# Patient Record
Sex: Female | Born: 1945 | Race: Black or African American | Hispanic: No | Smoking: Former smoker
Health system: Southern US, Community
[De-identification: ages and names within clinical notes are randomized; demographics above are authoritative.]

## PROBLEM LIST (undated history)

## (undated) DIAGNOSIS — N2581 Secondary hyperparathyroidism of renal origin: Secondary | ICD-10-CM

## (undated) DIAGNOSIS — I10 Essential (primary) hypertension: Secondary | ICD-10-CM

## (undated) DIAGNOSIS — I509 Heart failure, unspecified: Secondary | ICD-10-CM

## (undated) DIAGNOSIS — N289 Disorder of kidney and ureter, unspecified: Secondary | ICD-10-CM

## (undated) DIAGNOSIS — E785 Hyperlipidemia, unspecified: Secondary | ICD-10-CM

## (undated) DIAGNOSIS — H3323 Serous retinal detachment, bilateral: Secondary | ICD-10-CM

## (undated) DIAGNOSIS — N186 End stage renal disease: Secondary | ICD-10-CM

## (undated) DIAGNOSIS — E46 Unspecified protein-calorie malnutrition: Secondary | ICD-10-CM

## (undated) DIAGNOSIS — D649 Anemia, unspecified: Secondary | ICD-10-CM

## (undated) DIAGNOSIS — Z9289 Personal history of other medical treatment: Secondary | ICD-10-CM

## (undated) DIAGNOSIS — K5900 Constipation, unspecified: Secondary | ICD-10-CM

## (undated) DIAGNOSIS — I639 Cerebral infarction, unspecified: Secondary | ICD-10-CM

## (undated) DIAGNOSIS — I2510 Atherosclerotic heart disease of native coronary artery without angina pectoris: Secondary | ICD-10-CM

## (undated) HISTORY — PX: CATARACT EXTRACTION: SUR2

## (undated) HISTORY — PX: ARTERIOVENOUS GRAFT PLACEMENT: SUR1029

## (undated) HISTORY — DX: Atherosclerotic heart disease of native coronary artery without angina pectoris: I25.10

## (undated) HISTORY — DX: Cerebral infarction, unspecified: I63.9

## (undated) HISTORY — DX: Personal history of other medical treatment: Z92.89

## (undated) HISTORY — DX: Unspecified protein-calorie malnutrition: E46

## (undated) HISTORY — PX: DG AV DIALYSIS GRAFT DECLOT OR: HXRAD813

## (undated) HISTORY — DX: Secondary hyperparathyroidism of renal origin: N25.81

## (undated) HISTORY — DX: End stage renal disease: N18.6

## (undated) HISTORY — DX: Hyperlipidemia, unspecified: E78.5

## (undated) HISTORY — DX: Anemia, unspecified: D64.9

## (undated) HISTORY — DX: Essential (primary) hypertension: I10

## (undated) HISTORY — PX: TUBAL LIGATION: SHX77

---

## 2010-02-24 DIAGNOSIS — E785 Hyperlipidemia, unspecified: Secondary | ICD-10-CM | POA: Insufficient documentation

## 2010-02-24 DIAGNOSIS — N2581 Secondary hyperparathyroidism of renal origin: Secondary | ICD-10-CM | POA: Insufficient documentation

## 2010-02-24 DIAGNOSIS — I10 Essential (primary) hypertension: Secondary | ICD-10-CM | POA: Insufficient documentation

## 2010-02-24 DIAGNOSIS — E40 Kwashiorkor: Secondary | ICD-10-CM | POA: Insufficient documentation

## 2010-02-24 DIAGNOSIS — Z992 Dependence on renal dialysis: Secondary | ICD-10-CM

## 2010-02-24 DIAGNOSIS — N186 End stage renal disease: Secondary | ICD-10-CM

## 2010-02-24 DIAGNOSIS — D649 Anemia, unspecified: Secondary | ICD-10-CM

## 2010-02-24 DIAGNOSIS — I6350 Cerebral infarction due to unspecified occlusion or stenosis of unspecified cerebral artery: Secondary | ICD-10-CM

## 2010-02-24 DIAGNOSIS — E119 Type 2 diabetes mellitus without complications: Secondary | ICD-10-CM

## 2010-02-25 ENCOUNTER — Ambulatory Visit: Payer: Self-pay | Admitting: Internal Medicine

## 2010-02-25 ENCOUNTER — Encounter: Payer: Self-pay | Admitting: Internal Medicine

## 2010-02-25 DIAGNOSIS — R079 Chest pain, unspecified: Secondary | ICD-10-CM | POA: Insufficient documentation

## 2010-02-25 DIAGNOSIS — R0609 Other forms of dyspnea: Secondary | ICD-10-CM

## 2010-02-25 DIAGNOSIS — R0989 Other specified symptoms and signs involving the circulatory and respiratory systems: Secondary | ICD-10-CM

## 2010-03-12 ENCOUNTER — Telehealth (INDEPENDENT_AMBULATORY_CARE_PROVIDER_SITE_OTHER): Payer: Self-pay | Admitting: *Deleted

## 2010-03-13 ENCOUNTER — Ambulatory Visit: Payer: Self-pay

## 2010-03-13 ENCOUNTER — Ambulatory Visit (HOSPITAL_COMMUNITY): Admission: RE | Admit: 2010-03-13 | Payer: Self-pay | Source: Home / Self Care | Admitting: Internal Medicine

## 2010-03-25 ENCOUNTER — Encounter: Payer: Self-pay | Admitting: Internal Medicine

## 2010-03-25 ENCOUNTER — Ambulatory Visit (HOSPITAL_COMMUNITY)
Admission: RE | Admit: 2010-03-25 | Discharge: 2010-03-25 | Payer: Self-pay | Source: Home / Self Care | Attending: Internal Medicine | Admitting: Internal Medicine

## 2010-03-25 ENCOUNTER — Ambulatory Visit: Admission: RE | Admit: 2010-03-25 | Discharge: 2010-03-25 | Payer: Self-pay | Source: Home / Self Care

## 2010-03-25 ENCOUNTER — Encounter: Payer: Self-pay | Admitting: Cardiovascular Disease

## 2010-03-25 ENCOUNTER — Encounter (HOSPITAL_COMMUNITY)
Admission: RE | Admit: 2010-03-25 | Discharge: 2010-04-15 | Payer: Self-pay | Source: Home / Self Care | Attending: Internal Medicine | Admitting: Internal Medicine

## 2010-03-25 DIAGNOSIS — I495 Sick sinus syndrome: Secondary | ICD-10-CM

## 2010-03-26 ENCOUNTER — Ambulatory Visit (HOSPITAL_COMMUNITY)
Admission: RE | Admit: 2010-03-26 | Discharge: 2010-03-26 | Payer: Self-pay | Source: Home / Self Care | Attending: Nephrology | Admitting: Nephrology

## 2010-03-27 ENCOUNTER — Ambulatory Visit (HOSPITAL_COMMUNITY)
Admission: RE | Admit: 2010-03-27 | Discharge: 2010-03-27 | Payer: Self-pay | Source: Home / Self Care | Attending: Nephrology | Admitting: Nephrology

## 2010-03-31 LAB — BASIC METABOLIC PANEL
BUN: 79 mg/dL — ABNORMAL HIGH (ref 6–23)
CO2: 25 mEq/L (ref 19–32)
Calcium: 10.2 mg/dL (ref 8.4–10.5)
Chloride: 101 mEq/L (ref 96–112)
Creatinine, Ser: 15.48 mg/dL — ABNORMAL HIGH (ref 0.4–1.2)
GFR calc Af Amer: 3 mL/min — ABNORMAL LOW (ref >60)
GFR calc non Af Amer: 2 mL/min — ABNORMAL LOW (ref >60)
Glucose, Bld: 85 mg/dL (ref 70–99)
Potassium: 6.7 mEq/L (ref 3.5–5.1)
Sodium: 137 mEq/L (ref 135–145)

## 2010-03-31 LAB — POCT I-STAT 4, (NA,K, GLUC, HGB,HCT)
Glucose, Bld: 84 mg/dL (ref 70–99)
Glucose, Bld: 82 mg/dL (ref 70–99)
HCT: 31 % — ABNORMAL LOW (ref 36.0–46.0)
HCT: 37 % (ref 36.0–46.0)
Hemoglobin: 10.5 g/dL — ABNORMAL LOW (ref 12.0–15.0)
Hemoglobin: 12.6 g/dL (ref 12.0–15.0)
Potassium: 4.5 mEq/L (ref 3.5–5.1)
Potassium: 4 mEq/L (ref 3.5–5.1)
Sodium: 140 mEq/L (ref 135–145)
Sodium: 140 mEq/L (ref 135–145)

## 2010-03-31 LAB — GLUCOSE, CAPILLARY: Glucose-Capillary: 84 mg/dL (ref 70–99)

## 2010-03-31 LAB — POTASSIUM: Potassium: 7 mEq/L (ref 3.5–5.1)

## 2010-04-02 LAB — POCT I-STAT 4, (NA,K, GLUC, HGB,HCT)
Glucose, Bld: 83 mg/dL (ref 70–99)
HCT: 31 % — ABNORMAL LOW (ref 36.0–46.0)
Hemoglobin: 10.5 g/dL — ABNORMAL LOW (ref 12.0–15.0)
Potassium: 6.6 mEq/L (ref 3.5–5.1)
Sodium: 138 mEq/L (ref 135–145)

## 2010-04-08 ENCOUNTER — Ambulatory Visit: Admit: 2010-04-08 | Payer: Self-pay | Admitting: Internal Medicine

## 2010-04-17 NOTE — Assessment & Plan Note (Addendum)
Summary: Cardiology Nuclear Testing  Nuclear Med Background Indications for Stress Test: Evaluation for Ischemia   History: Heart Catheterization  History Comments: 6 years ago ~ Heart Cath: NL  Symptoms: Chest Pain with Exertion, DOE, Palpitations    Nuclear Pre-Procedure Cardiac Risk Factors: CVA, Hypertension, IDDM Type 1, Lipids Caffeine/Decaff Intake: None NPO After: 5:00 PM Lungs: clear IV 0.9% NS with Angio Cath: 22g     IV Site: L Hand IV Started by: Eliezer Lofts, EMT-P Chest Size (in) 40     Cup Size C     Height (in): 64 Weight (lb): 193 BMI: 33.25  Nuclear Med Study 1 or 2 day study:  1 day     Stress Test Type:  Carlton Adam Reading MD:  Mertie Moores, MD     Referring MD:  S.Klein Resting Radionuclide:  Technetium 45m Tetrofosmin     Resting Radionuclide Dose:  11 mCi  Stress Radionuclide:  Technetium 80m Tetrofosmin     Stress Radionuclide Dose:  33 mCi   Stress Protocol  Max Systolic BP: 123XX123 mm Hg Lexiscan: 0.4 mg   Stress Test Technologist:  Perrin Maltese, EMT-P     Nuclear Technologist:  Charlton Amor, CNMT  Rest Procedure  Myocardial perfusion imaging was performed at rest 45 minutes following the intravenous administration of Technetium 32m Tetrofosmin.  Stress Procedure  The patient received IV Lexiscan 0.4 mg over 15-seconds.  Technetium 5m Tetrofosmin injected at 30-seconds. The patient had sob and a funny feeling with the [place].  There were no significant changes and rare pvcs/pacs with infusion.  Quantitative spect images were obtained after a 45 minute delay.  QPS Raw Data Images:  Normal; no motion artifact; normal heart/lung ratio. Stress Images:  Normal homogeneous uptake in all areas of the myocardium. Rest Images:  Normal homogeneous uptake in all areas of the myocardium. Subtraction (SDS):  No evidence of ischemia. Transient Ischemic Dilatation:  .99  (Normal <1.22)  Lung/Heart Ratio:  .32  (Normal <0.45)  Quantitative Gated Spect  Images QGS EDV:  132 ml QGS ESV:  59 ml QGS EF:  55 % QGS cine images:  Normal LV function.  Findings Normal nuclear study      Overall Impression  Exercise Capacity: Lexiscan with no exercise. BP Response: Normal blood pressure response. Clinical Symptoms: No chest pain ECG Impression: No significant ST segment change suggestive of ischemia. Overall Impression: Normal stress nuclear study. Overall Impression Comments: No evidence of ischemia.  Normal LV function.  Appended Document: Cardiology Nuclear Testing adv pt of results. Joan Flores, RN, BSN

## 2010-04-17 NOTE — Progress Notes (Signed)
Summary: Nuclear Pre-Procedure  Phone Note Outgoing Call Call back at [facility] Phone [phone] Call back at [phone]   Call placed by: Eliezer Lofts, EMT-P,  March 12, 2010 3:49 PM Call placed to: Patient Action Taken: Phone Call Completed Summary of Call: Reviewed information on Myoview Information Sheet (see scanned document for further details).  Spoke with Patient's son, Gerald Stabs, at [phone], and Spoke with Patient on # [phone]. Eliezer Lofts, EMT-P  March 12, 2010 3:51 PM     Nuclear Med Background Indications for Stress Test: Evaluation for Ischemia   History: Heart Catheterization  History Comments: 6 years ago ~ Heart Cath: NL  Symptoms: Chest Pain with Exertion, DOE    Nuclear Pre-Procedure Cardiac Risk Factors: CVA, Hypertension, IDDM Type 1, Lipids Height (in): 64

## 2010-04-17 NOTE — Assessment & Plan Note (Signed)
Summary: NEP/BRADYCARDIA-MB   Visit Type:  np  CC:  dizziness, SOB, and .  History of Present Illness: Mrs. Bresson is seen at the request of Dr.Coldonato  because of bradycardia identified at dialysis.  she has a history of exercise intolerance that has been relatively stable over the last 2-3 years. Ii is  PRECEDED BY CHEST TIGHTNESS THAT DOES NOT RADIATE> It is occasionally accompanied by diaphoresis. It is relieved by rest but often takes 30 minutes. It is occasionally associated with palpitations.  Her cardiac risk profile is notable for diabetes hypertension dyslipidemia; she does not smoke her family history is negative for coronary disease.  includes records from the dialysis center were reviewed.  Preventive Screening-Counseling & Management  Alcohol-Tobacco     Smoking Status: never  Caffeine-Diet-Exercise     Does Patient Exercise: no  Problems Prior to Update: 1)  Bradycardia  (ICD-427.81) 2)  Chest Pain, Exertional  (ICD-786.50) 3)  Dyspnea On Exertion  (ICD-786.09) 4)  Hypertension  (ICD-401.9) 5)  Hyperlipidemia  (ICD-272.4) 6)  Cva  (ICD-434.91) 7)  Protein Malnutrition  (ICD-260) 8)  Secondary Hyperparathyroidism  (ICD-588.81) 9)  Anemia  (ICD-285.9) 10)  Dm  (ICD-250.00) 11)  End Stage Renal Disease  (ICD-585.6)  Current Medications (verified): 1)  Vasotec 10 Mg Tabs (Enalapril Maleate) .... Once Daily 2)  Pravachol 40 Mg Tabs (Pravastatin Sodium) .... Once Daily 3)  Sensipar 60 Mg Tabs (Cinacalcet Hcl) .... Once Daily 4)  Norvasc 10 Mg Tabs (Amlodipine Besylate) .... Once Daily 5)  Eliphos 667 Mg Tabs (Calcium Acetate (Phos Binder)) .... Once Daily 6)  Humulin N 100 Unit/ml Susp (Insulin Isophane Human) .... Take As Directed  Allergies (verified): No Known Drug Allergies  Past History:  Past Medical History: Last updated: 02/24/2010 HYPERTENSION  HYPERLIPIDEMIA  CVA  PROTEIN MALNUTRITION  SECONDARY HYPERPARATHYROIDISM ANEMIA DM  END STAGE  RENAL DISEASE  Past Surgical History: Last updated: 02/24/2010 Cataract sugery  Tubal ligation  Family History: Family History of Cardiomyopathy:   Social History: Divorced 5 children Tobacco Use - No.  Alcohol Use - no Regular Exercise - no Smoking Status:  never Does Patient Exercise:  no  Review of Systems       full review of systems was negative apart from a history of present illness and past medical history except dentures   Vital Signs:  Patient profile:   65 year old female Height:      64 inches Weight:      191.25 pounds BMI:     32.95 Pulse rate:   70 / minute BP sitting:   100 / 54  (left arm) Cuff size:   regular  Vitals Entered By: Hansel Feinstein CMA (February 25, 2010 3:55 PM)  Physical Exam  General:  elderly African American female well-nourished appearing her stated age. She is moderately obese Head:  normal HEENT Neck:  supple without thyromegaly there is no lymphadenopathy Chest Wall:  without scoliosis or kyphosis Lungs:  clear to auscultation Heart:  regular rate and rhythm with an 0000000 2/6 systolic murmur on the left lower sternal border to the apex there is no clubbing cyanosis or edema. No JVD. Negative HJR Abdomen:  soft with active bowel sounds hepatomegaly or midline pulsation Msk:  without skeletal defects Pulses:  intact distal pulses Extremities:  without clubbing cyanosis or edema Neurologic:  alert and oriented and grossly normal her sensory function Skin:  warm and dry Psych:  engaging affect   EKG  Procedure date:  02/25/2010  Findings:      sinus rhythm at 68 Intervals 0.15 5.09/24 2 Axis is -5 Nonspecific T wave changes  Impression & Recommendations:  Problem # 1:  BRADYCARDIA (ICD-427.81)  the patient has had bradycardia at dialysis. We'll undertake a [facility] monitor to identify the degree and frequency of her bradycardia and dizziness we can identify any associated symptoms. Her updated medication list for  this problem includes:    Vasotec 10 Mg Tabs (Enalapril maleate) ..... Once daily    Norvasc 10 Mg Tabs (Amlodipine besylate) ..... Once daily  Orders: Holter Monitor (Holter Monitor)  Problem # 2:  CHEST PAIN, EXERTIONAL (ICD-786.50)  she is a high risk for coronary artery disease. However, she had a negative catheterization apparently at the [facility] about 6 years ago. Hence her pretest probability is modestly diminished and will undertake Myoview scanning to clarify the presence of coronary disease. I would have a low threshold for pursuing catheterization  Her updated medication list for this problem includes:    Vasotec 10 Mg Tabs (Enalapril maleate) ..... Once daily    Norvasc 10 Mg Tabs (Amlodipine besylate) ..... Once daily  Problem # 3:  DYSPNEA ON EXERTION (ICD-786.09)  dyspnea may be related to both structural abnormalities extrinsic compression from a uremic effusion. We will undertake a 2-D echo to look at this. Her updated medication list for this problem includes:    Vasotec 10 Mg Tabs (Enalapril maleate) ..... Once daily    Norvasc 10 Mg Tabs (Amlodipine besylate) ..... Once daily  Orders: Echocardiogram (Echo)  Other Orders: Nuclear Stress Test (Nuc Stress Test)  Patient Instructions: 1)  Your physician recommends that you schedule a follow-up appointment in: 5 weeks 2)  Your physician recommends that you continue on your current medications as directed. Please refer to the Current Medication list given to you today. 3)  Your physician has requested that you have an echocardiogram.  Echocardiography is a painless test that uses sound waves to create images of your heart. It provides your doctor with information about the size and shape of your heart and how well your heart's chambers and valves are working.  This procedure takes approximately one hour. There are no restrictions for this procedure. 4)  Your physician has recommended that you wear a holter  monitor.  Holter monitors are medical devices that record the heart's electrical activity. Doctors most often use these monitors to diagnose arrhythmias. Arrhythmias are problems with the speed or rhythm of the heartbeat. The monitor is a small, portable device. You can wear one while you do your normal daily activities. This is usually used to diagnose what is causing palpitations/syncope (passing out). 5)  Your physician has requested that you have a Lexiscan myoview.  For further information please visit [email or web address].  Please follow instruction sheet, as given.

## 2010-06-26 ENCOUNTER — Telehealth: Payer: Self-pay | Admitting: *Deleted

## 2010-06-26 NOTE — Telephone Encounter (Signed)
LMTCB RE MONITOR RESULTS HR 40-120 WITH OCC PVC PER DR KLEIN./CY

## 2010-06-27 NOTE — Telephone Encounter (Signed)
LMTCB ./CY 

## 2010-06-30 NOTE — Telephone Encounter (Signed)
PT AWARE,.CY

## 2010-07-09 ENCOUNTER — Encounter: Payer: Self-pay | Admitting: Internal Medicine

## 2010-07-10 ENCOUNTER — Ambulatory Visit (INDEPENDENT_AMBULATORY_CARE_PROVIDER_SITE_OTHER): Payer: Medicare Other | Admitting: Internal Medicine

## 2010-07-10 ENCOUNTER — Encounter: Payer: Self-pay | Admitting: Internal Medicine

## 2010-07-10 DIAGNOSIS — I498 Other specified cardiac arrhythmias: Secondary | ICD-10-CM

## 2010-07-10 DIAGNOSIS — R001 Bradycardia, unspecified: Secondary | ICD-10-CM

## 2010-07-10 DIAGNOSIS — R079 Chest pain, unspecified: Secondary | ICD-10-CM

## 2010-07-10 NOTE — Assessment & Plan Note (Signed)
We reviewed the normal nuclear study. This is very reassuring

## 2010-07-10 NOTE — Assessment & Plan Note (Signed)
E. The recording demonstrated intermittent sinus bradycardia. There have been no attributable symptoms. At this point I would just observe and let symptoms declarer themselves.

## 2010-07-10 NOTE — Progress Notes (Signed)
  HPI  Stefanie [name] is a 65 y.o. female Seen in followup for bradycardia that was identified at dialysis. She also had atypical chest pains and underwent Myoview scanning after we last saw her. This demonstrated normal left ventricular function and no ischemia. A 20 day event recorder was pursued demonstrating mostly normal heart rate. There is one occasion where her heart rate was identified at 39 and rarely in the 40s her mean heart rate on Sundays was in the 75s. Heart rate excursion was adequate  She denies significant problems with chest pain shortness of breath; there has been no lightheadedness.  Past Medical History  Diagnosis Date  . Hypertension   . Hyperlipidemia   . Diabetes mellitus   . CVA (cerebral vascular accident)   . Protein malnutrition   . Secondary hyperparathyroidism   . Anemia   . End stage renal disease     Past Surgical History  Procedure Date  . Cataract extraction   . Tubal ligation     Current Outpatient Prescriptions  Medication Sig Dispense Refill  . amLODipine (NORVASC) 10 MG tablet Take 10 mg by mouth daily.        . calcium acetate (ELIPHOS) 667 MG tablet Take 667 mg by mouth daily.        . cinacalcet (SENSIPAR) 60 MG tablet Take 60 mg by mouth daily.        . enalapril (VASOTEC) 10 MG tablet Take 10 mg by mouth daily.        . insulin NPH (HUMULIN N,NOVOLIN N) 100 UNIT/ML injection 30 units morning and 20 units qhs      . pravastatin (PRAVACHOL) 40 MG tablet Take 40 mg by mouth daily.          No Known Allergies  Review of Systems negative except from HPI and PMH  Physical Exam Well developed and well nourished in no acute distress HENT normal E scleral and icterus clear Neck Supple JVP flat; carotids brisk and full Clear to ausculation Regular rate and rhythm, no murmurs +s4 Soft with active bowel sounds No clubbing cyanosis and edema Alert and oriented, grossly normal motor and sensory function Skin Warm and  Dry     Assessment and  Plan

## 2010-07-10 NOTE — Patient Instructions (Signed)
Your physician recommends that you schedule a follow-up appointment in: AS NEEDED  Your physician recommends that you continue on your current medications as directed. Please refer to the Current Medication list given to you today.  

## 2010-07-18 ENCOUNTER — Other Ambulatory Visit (HOSPITAL_COMMUNITY): Payer: Self-pay | Admitting: Nephrology

## 2010-07-18 DIAGNOSIS — N186 End stage renal disease: Secondary | ICD-10-CM

## 2010-07-24 ENCOUNTER — Other Ambulatory Visit (HOSPITAL_COMMUNITY): Payer: Self-pay | Admitting: Nephrology

## 2010-07-24 ENCOUNTER — Ambulatory Visit (HOSPITAL_COMMUNITY)
Admission: RE | Admit: 2010-07-24 | Discharge: 2010-07-24 | Disposition: A | Discharge status: Home / Self Care | Payer: Medicare Other | Source: Ambulatory Visit | Attending: Nephrology | Admitting: Nephrology

## 2010-07-24 DIAGNOSIS — N186 End stage renal disease: Secondary | ICD-10-CM

## 2010-07-24 DIAGNOSIS — Y832 Surgical operation with anastomosis, bypass or graft as the cause of abnormal reaction of the patient, or of later complication, without mention of misadventure at the time of the procedure: Secondary | ICD-10-CM | POA: Insufficient documentation

## 2010-07-24 DIAGNOSIS — T82898A Other specified complication of vascular prosthetic devices, implants and grafts, initial encounter: Secondary | ICD-10-CM | POA: Insufficient documentation

## 2010-07-24 MED ORDER — IOHEXOL 300 MG/ML  SOLN
100.0000 mL | Freq: Once | INTRAMUSCULAR | Status: AC | PRN
  Administered 2010-07-24: 40 mL via INTRAVENOUS

## 2010-09-19 ENCOUNTER — Other Ambulatory Visit (HOSPITAL_COMMUNITY): Payer: Self-pay | Admitting: Nephrology

## 2010-09-19 DIAGNOSIS — N186 End stage renal disease: Secondary | ICD-10-CM

## 2010-09-19 DIAGNOSIS — N19 Unspecified kidney failure: Secondary | ICD-10-CM

## 2010-09-30 ENCOUNTER — Other Ambulatory Visit (HOSPITAL_COMMUNITY): Payer: Medicare Other

## 2010-10-02 ENCOUNTER — Other Ambulatory Visit (HOSPITAL_COMMUNITY): Payer: Medicare Other

## 2010-10-02 ENCOUNTER — Ambulatory Visit (HOSPITAL_COMMUNITY): Payer: Medicare Other

## 2010-10-09 ENCOUNTER — Ambulatory Visit (HOSPITAL_COMMUNITY)
Admission: RE | Admit: 2010-10-09 | Discharge: 2010-10-09 | Disposition: A | Discharge status: Home / Self Care | Payer: Medicare Other | Source: Ambulatory Visit | Attending: Nephrology | Admitting: Nephrology

## 2010-10-09 ENCOUNTER — Other Ambulatory Visit (HOSPITAL_COMMUNITY): Payer: Self-pay | Admitting: Nephrology

## 2010-10-09 DIAGNOSIS — I871 Compression of vein: Secondary | ICD-10-CM | POA: Insufficient documentation

## 2010-10-09 DIAGNOSIS — E119 Type 2 diabetes mellitus without complications: Secondary | ICD-10-CM | POA: Insufficient documentation

## 2010-10-09 DIAGNOSIS — T82898A Other specified complication of vascular prosthetic devices, implants and grafts, initial encounter: Secondary | ICD-10-CM | POA: Insufficient documentation

## 2010-10-09 DIAGNOSIS — N19 Unspecified kidney failure: Secondary | ICD-10-CM

## 2010-10-09 DIAGNOSIS — Y832 Surgical operation with anastomosis, bypass or graft as the cause of abnormal reaction of the patient, or of later complication, without mention of misadventure at the time of the procedure: Secondary | ICD-10-CM | POA: Insufficient documentation

## 2010-10-09 DIAGNOSIS — N186 End stage renal disease: Secondary | ICD-10-CM | POA: Insufficient documentation

## 2010-10-09 DIAGNOSIS — I120 Hypertensive chronic kidney disease with stage 5 chronic kidney disease or end stage renal disease: Secondary | ICD-10-CM | POA: Insufficient documentation

## 2010-10-09 DIAGNOSIS — Z8673 Personal history of transient ischemic attack (TIA), and cerebral infarction without residual deficits: Secondary | ICD-10-CM | POA: Insufficient documentation

## 2010-10-09 MED ORDER — IOHEXOL 300 MG/ML  SOLN
100.0000 mL | Freq: Once | INTRAMUSCULAR | Status: AC | PRN
  Administered 2010-10-09: 60 mL via INTRAVENOUS

## 2010-10-13 ENCOUNTER — Other Ambulatory Visit (HOSPITAL_COMMUNITY): Payer: Self-pay | Admitting: Nephrology

## 2010-10-13 DIAGNOSIS — T8249XA Other complication of vascular dialysis catheter, initial encounter: Secondary | ICD-10-CM

## 2010-10-14 ENCOUNTER — Ambulatory Visit (HOSPITAL_COMMUNITY)
Admission: RE | Admit: 2010-10-14 | Discharge: 2010-10-14 | Disposition: A | Discharge status: Home / Self Care | Payer: Medicare Other | Source: Ambulatory Visit | Attending: Nephrology | Admitting: Nephrology

## 2010-10-14 ENCOUNTER — Other Ambulatory Visit (HOSPITAL_COMMUNITY): Payer: Self-pay | Admitting: Nephrology

## 2010-10-14 DIAGNOSIS — E119 Type 2 diabetes mellitus without complications: Secondary | ICD-10-CM | POA: Insufficient documentation

## 2010-10-14 DIAGNOSIS — Z8673 Personal history of transient ischemic attack (TIA), and cerebral infarction without residual deficits: Secondary | ICD-10-CM | POA: Insufficient documentation

## 2010-10-14 DIAGNOSIS — D649 Anemia, unspecified: Secondary | ICD-10-CM | POA: Insufficient documentation

## 2010-10-14 DIAGNOSIS — N186 End stage renal disease: Secondary | ICD-10-CM | POA: Insufficient documentation

## 2010-10-14 DIAGNOSIS — I120 Hypertensive chronic kidney disease with stage 5 chronic kidney disease or end stage renal disease: Secondary | ICD-10-CM | POA: Insufficient documentation

## 2010-10-14 DIAGNOSIS — I509 Heart failure, unspecified: Secondary | ICD-10-CM | POA: Insufficient documentation

## 2010-10-14 DIAGNOSIS — T8249XA Other complication of vascular dialysis catheter, initial encounter: Secondary | ICD-10-CM

## 2010-10-14 LAB — POTASSIUM: Potassium: 5.5 mEq/L — ABNORMAL HIGH (ref 3.5–5.1)

## 2010-10-28 ENCOUNTER — Ambulatory Visit: Payer: Medicare Other | Admitting: Vascular Surgery

## 2010-11-04 ENCOUNTER — Encounter: Payer: Self-pay | Admitting: Vascular Surgery

## 2010-11-04 ENCOUNTER — Ambulatory Visit (INDEPENDENT_AMBULATORY_CARE_PROVIDER_SITE_OTHER): Payer: Medicare Other | Admitting: Vascular Surgery

## 2010-11-04 VITALS — BP 99/50 | HR 68 | Temp 98.1°F | Ht 66.0 in | Wt 185.0 lb

## 2010-11-04 DIAGNOSIS — N186 End stage renal disease: Secondary | ICD-10-CM

## 2010-11-04 NOTE — Progress Notes (Deleted)
Subjective:     Patient ID: Stefanie [name], female   DOB: 05-07-1945, 65 y.o.   MRN: [id number]  HPI   Review of Systems  Constitutional: Negative for fever, chills, activity change, appetite change, fatigue and unexpected weight change.  HENT: Negative for hearing loss, congestion, sore throat, trouble swallowing, neck pain and voice change.   Eyes: Negative for visual disturbance.  Respiratory: Negative for cough, chest tightness, shortness of breath, wheezing and stridor.   Cardiovascular: Negative for chest pain, palpitations and leg swelling.  Gastrointestinal: Negative for nausea, abdominal pain, diarrhea, constipation, blood in stool and abdominal distention.  Genitourinary: Negative for dysuria, urgency, frequency, hematuria and difficulty urinating.  Musculoskeletal: Negative for back pain, joint swelling and gait problem.  Skin: Negative for color change, pallor, rash and wound.  Neurological: Negative for dizziness, seizures, syncope, facial asymmetry, speech difficulty, weakness, light-headedness, numbness and headaches.  Hematological: Negative for adenopathy. Does not bruise/bleed easily.  Psychiatric/Behavioral: Negative for confusion.       Objective:   Physical Exam     Assessment:     ***    Plan:     ***

## 2010-11-04 NOTE — Progress Notes (Signed)
Subjective:     Patient ID: Stefanie [name], female   DOB: [birth year], 65 y.o.   MRN: [id number]  HPI this 65 year old female is referred for vascular access evaluation. She has had previous AV graft in the left upper arm which is non-revised . 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 She has never had AV access in the lower extremities. She denies a history of cellulitis gangrene or infection in the lower extremities.  Past Medical History  Diagnosis Date  . Hypertension   . Hyperlipidemia   . Diabetes mellitus   . CVA (cerebral vascular accident)   . Protein malnutrition   . Secondary hyperparathyroidism   . Anemia   . End stage renal disease     History  Substance Use Topics  . Smoking status: Former Smoker    Quit date: 03/17/1983  . Smokeless tobacco: Not on file  . Alcohol Use: No    Family History  Problem Relation Age of Onset  . Cardiomyopathy    . Heart disease Mother   . Diabetes Mother   . Diabetes Other   . Kidney disease Other     No Known Allergies  Current outpatient prescriptions:amLODipine (NORVASC) 10 MG tablet, Take 10 mg by mouth daily.  , Disp: , Rfl: ;  calcium acetate (ELIPHOS) 667 MG tablet, Take 667 mg by mouth daily.  , Disp: , Rfl: ;  cinacalcet (SENSIPAR) 60 MG tablet, Take 60 mg by mouth daily.  , Disp: , Rfl: ;  enalapril (VASOTEC) 10 MG tablet, Take 10 mg by mouth daily.  , Disp: , Rfl:  insulin NPH (HUMULIN N,NOVOLIN N) 100 UNIT/ML injection, 30 units morning and 20 units qhs, Disp: , Rfl: ;  pravastatin (PRAVACHOL) 40 MG tablet, Take 40 mg by mouth daily.  , Disp: , Rfl: ;  isosorbide mononitrate (IMDUR) 30 MG 24 hr tablet, , Disp: , Rfl:   Filed Vitals:   11/04/10 0912  Height: 5\' 6"  (1.676 m)  Weight: 185 lb (83.915 kg)    Body mass index is 29.86 kg/(m^2).        Review of Systems the patient has a history of 2 previous strokes in [place] she is ambulatory but has had weakness on both sides of her body and a speech deficit. She denies active chest pain but does have dyspnea on exertion. Denies abdominal pain or GI symptoms. All other systems are negative.    Objective:   Physical Exam  Constitutional: She is oriented to person, place, and time. She appears well-developed and well-nourished.  HENT:  Head: Normocephalic.  Nose: Nose normal.  Mouth/Throat: Oropharynx is clear and moist.  Eyes: EOM are normal. Pupils are equal, round, and reactive to light. No scleral icterus.  Neck: Normal range of motion. Neck supple. No JVD present.  Cardiovascular: Normal rate, regular rhythm and normal heart sounds.   No murmur heard.      Both feet are well perfused. She has 3+ femoral pulses bilaterally. No popliteal or distal pulses are palpable. AV graft in the left upper arm is occluded. Right upper arm basilic vein transposition is occluded.  Pulmonary/Chest: Effort normal and breath sounds normal. No stridor. No respiratory distress. She has no wheezes. She has no rales.  Abdominal: Soft. She exhibits no distension and no mass. There is no tenderness. There is no rebound and no guarding.  Musculoskeletal: Normal range  of motion. She exhibits no edema and no tenderness.  Lymphadenopathy:    She has no cervical adenopathy.  Neurological: She is alert and oriented to person, place, and time. Coordination normal.  Skin: Skin is warm and dry. No rash noted. No erythema.  Psychiatric: She has a normal mood and affect. Her behavior is normal.    blood pressure is 199/50 heart rate 68 respirations 14 temperature 98    Assessment:     65 year old female who needs vascular access in the lower extremity.    Plan:     Plan insertion of right thigh AV graft on Thursday, August 30 at [facility]. Risks and benefits have been discussed and she would like to proceed.

## 2010-11-05 ENCOUNTER — Telehealth: Payer: Self-pay | Admitting: *Deleted

## 2010-11-05 NOTE — Telephone Encounter (Signed)
Telephone encounter opened in error.

## 2010-11-13 ENCOUNTER — Ambulatory Visit (HOSPITAL_COMMUNITY)
Admission: RE | Admit: 2010-11-13 | Discharge: 2010-11-15 | DRG: 673 | Disposition: A | Discharge status: Home / Self Care | Payer: Medicare Other | Source: Ambulatory Visit | Attending: Vascular Surgery | Admitting: Vascular Surgery

## 2010-11-13 ENCOUNTER — Inpatient Hospital Stay (HOSPITAL_COMMUNITY): Payer: Medicare Other

## 2010-11-13 DIAGNOSIS — N186 End stage renal disease: Secondary | ICD-10-CM | POA: Insufficient documentation

## 2010-11-13 DIAGNOSIS — I120 Hypertensive chronic kidney disease with stage 5 chronic kidney disease or end stage renal disease: Secondary | ICD-10-CM | POA: Insufficient documentation

## 2010-11-13 DIAGNOSIS — D638 Anemia in other chronic diseases classified elsewhere: Secondary | ICD-10-CM | POA: Insufficient documentation

## 2010-11-13 DIAGNOSIS — N2581 Secondary hyperparathyroidism of renal origin: Secondary | ICD-10-CM | POA: Insufficient documentation

## 2010-11-13 DIAGNOSIS — E669 Obesity, unspecified: Secondary | ICD-10-CM | POA: Insufficient documentation

## 2010-11-13 DIAGNOSIS — E785 Hyperlipidemia, unspecified: Secondary | ICD-10-CM | POA: Insufficient documentation

## 2010-11-13 DIAGNOSIS — E119 Type 2 diabetes mellitus without complications: Secondary | ICD-10-CM | POA: Insufficient documentation

## 2010-11-13 DIAGNOSIS — Z01818 Encounter for other preprocedural examination: Secondary | ICD-10-CM | POA: Insufficient documentation

## 2010-11-13 DIAGNOSIS — R29898 Other symptoms and signs involving the musculoskeletal system: Secondary | ICD-10-CM | POA: Insufficient documentation

## 2010-11-13 DIAGNOSIS — I69998 Other sequelae following unspecified cerebrovascular disease: Secondary | ICD-10-CM | POA: Insufficient documentation

## 2010-11-13 DIAGNOSIS — Z01810 Encounter for preprocedural cardiovascular examination: Secondary | ICD-10-CM | POA: Insufficient documentation

## 2010-11-13 DIAGNOSIS — Z794 Long term (current) use of insulin: Secondary | ICD-10-CM | POA: Insufficient documentation

## 2010-11-13 HISTORY — PX: ARTERIOVENOUS GRAFT PLACEMENT: SUR1029

## 2010-11-13 LAB — POCT I-STAT 4, (NA,K, GLUC, HGB,HCT)
HCT: 35 % — ABNORMAL LOW (ref 36.0–46.0)
Potassium: 3.7 mEq/L (ref 3.5–5.1)
Sodium: 138 mEq/L (ref 135–145)

## 2010-11-13 LAB — GLUCOSE, CAPILLARY: Glucose-Capillary: 111 mg/dL — ABNORMAL HIGH (ref 70–99)

## 2010-11-14 ENCOUNTER — Inpatient Hospital Stay (HOSPITAL_COMMUNITY): Payer: Medicare Other

## 2010-11-14 LAB — COMPREHENSIVE METABOLIC PANEL
AST: 50 U/L — ABNORMAL HIGH (ref 0–37)
Albumin: 3 g/dL — ABNORMAL LOW (ref 3.5–5.2)
Alkaline Phosphatase: 41 U/L (ref 39–117)
Chloride: 97 mEq/L (ref 96–112)
Potassium: 3.6 mEq/L (ref 3.5–5.1)
Total Bilirubin: 0.2 mg/dL — ABNORMAL LOW (ref 0.3–1.2)

## 2010-11-14 LAB — CBC
HCT: 31.3 % — ABNORMAL LOW (ref 36.0–46.0)
Platelets: 87 10*3/uL — ABNORMAL LOW (ref 150–400)
RBC: 3.12 MIL/uL — ABNORMAL LOW (ref 3.87–5.11)
RDW: 13.7 % (ref 11.5–15.5)
WBC: 6.8 10*3/uL (ref 4.0–10.5)

## 2010-11-14 LAB — GLUCOSE, CAPILLARY: Glucose-Capillary: 191 mg/dL — ABNORMAL HIGH (ref 70–99)

## 2010-11-15 LAB — GLUCOSE, CAPILLARY: Glucose-Capillary: 76 mg/dL (ref 70–99)

## 2010-11-20 LAB — CULTURE, BLOOD (ROUTINE X 2)
Culture  Setup Time: 201208310845
Culture  Setup Time: 201208310845
Culture: NO GROWTH
Culture: NO GROWTH

## 2010-12-01 NOTE — Op Note (Signed)
NAME:  Stefanie [name], Stefanie [name]              ACCOUNT NO.:  [id number]  MEDICAL RECORD NO.:  [id number]  LOCATION:  2899                         FACILITY:  [facility]  PHYSICIAN:  Nelda Severe. Kellie Simmering, M.D.  DATE OF BIRTH:  Apr 20, 1945  DATE OF PROCEDURE:  11/13/2010 DATE OF DISCHARGE:                              OPERATIVE REPORT   PREOPERATIVE DIAGNOSIS:  End-stage renal disease.  POSTOPERATIVE DIAGNOSIS:  End-stage renal disease.  OPERATION: 1. Insertion of a left common femoral. 2. Saphenous vein AV Gore-Tex graft, left leg with 6 mm Gore-Tex.  SURGEON:  Nelda Severe. Kellie Simmering, MD  FIRST ASSISTANT:  Wray Kearns, PA-C  ANESTHESIA:  General LMA.  PROCEDURE:  The patient was taken to the operating room, placed in the supine position at which time satisfactory general LMA anesthesia was administered.  Initially, the right thigh was prepped and draped in routine sterile manner.  I was unable to palpate an excellent femoral pulse on the right.  I listened with a Doppler and it was somewhat dampened and then after listening to the left side, it was clear that the femoral pulse on the left was better than the right, therefore, we prepped the left leg.  Attempted to talk to the family members about the fact that we had changed from the right leg to the left leg as was on the permit, but I had actually discussed with the patient that I would need to choose one leg, no specific preference at that time.  Therefore, the left leg was prepped with Betadine scrub solution and draped in routine sterile manner.  Short longitudinal incision was made just distal to the left inguinal crease, carried down through the subcutaneous tissue.  The common superficial profunda femoris arteries were dissected free, encircled with vessel loops.  The superficial femoral artery was heavily calcified, did have a weak pulse, it was very diseased.  The common femoral was mildly diseased, but had an excellent pulse.  Saphenous  vein was exposed up to the saphenofemoral junction, anterior branch was ligated and eventually this was transected at its junction with the saphenous vein for the venotomy.  Curvilinear tunnel was created and using a small counterincision at the apex of the loop and a 6-mm Gore-Tex graft delivered through the tunnel.  3000 units of heparin was given intravenously.  Femoral vessels were occluded with vascular clamps.  Longitudinal opening made in the very distal common femoral artery extended down to the origin, the superficial femoral artery which was diseased as noted.  The 6-mm graft was slightly spatulated and anastomosed to end-to-side with 6-0 Prolene.  Clamps then released.  There was good flow down to the superficial femoral artery and profunda.  As noted, the anterior accessory branch of the saphenous had been excised right at the junction of the saphenous vein and this venotomy extended up to within about 3 cm of the junction, graft was spatulated and anastomosed end-to-side with 6-0 Prolene, clamps released.  There was an excellent pulse and thrill in the graft and good preserved flow down the severely diseased superficial femoral and profunda.  Adequate hemostasis was achieved without protamine and wounds closed in layers with Vicryl in subcuticular fashion with Dermabond.  The patient taken to recovery room in stable condition.     Nelda Severe Kellie Simmering, M.D.     JDL/MEDQ  D:  11/13/2010  T:  11/13/2010  Job:  [id number]  Electronically Signed by Tinnie Gens M.D. on 12/01/2010 02:15:36 PM

## 2010-12-28 ENCOUNTER — Emergency Department (HOSPITAL_COMMUNITY): Payer: Medicare Other

## 2010-12-28 ENCOUNTER — Emergency Department (HOSPITAL_COMMUNITY)
Admission: EM | Admit: 2010-12-28 | Discharge: 2010-12-28 | Disposition: A | Discharge status: Home / Self Care | Payer: Medicare Other | Attending: Emergency Medicine | Admitting: Emergency Medicine

## 2010-12-28 DIAGNOSIS — M25519 Pain in unspecified shoulder: Secondary | ICD-10-CM | POA: Insufficient documentation

## 2010-12-28 DIAGNOSIS — W06XXXA Fall from bed, initial encounter: Secondary | ICD-10-CM | POA: Insufficient documentation

## 2010-12-28 DIAGNOSIS — Z992 Dependence on renal dialysis: Secondary | ICD-10-CM | POA: Insufficient documentation

## 2010-12-28 DIAGNOSIS — N186 End stage renal disease: Secondary | ICD-10-CM | POA: Insufficient documentation

## 2010-12-28 DIAGNOSIS — Z8673 Personal history of transient ischemic attack (TIA), and cerebral infarction without residual deficits: Secondary | ICD-10-CM | POA: Insufficient documentation

## 2010-12-28 DIAGNOSIS — Z794 Long term (current) use of insulin: Secondary | ICD-10-CM | POA: Insufficient documentation

## 2010-12-28 DIAGNOSIS — S7000XA Contusion of unspecified hip, initial encounter: Secondary | ICD-10-CM | POA: Insufficient documentation

## 2010-12-28 DIAGNOSIS — S40019A Contusion of unspecified shoulder, initial encounter: Secondary | ICD-10-CM | POA: Insufficient documentation

## 2010-12-28 DIAGNOSIS — M67919 Unspecified disorder of synovium and tendon, unspecified shoulder: Secondary | ICD-10-CM | POA: Insufficient documentation

## 2010-12-28 DIAGNOSIS — M719 Bursopathy, unspecified: Secondary | ICD-10-CM | POA: Insufficient documentation

## 2010-12-28 DIAGNOSIS — M25559 Pain in unspecified hip: Secondary | ICD-10-CM | POA: Insufficient documentation

## 2010-12-28 DIAGNOSIS — Y92009 Unspecified place in unspecified non-institutional (private) residence as the place of occurrence of the external cause: Secondary | ICD-10-CM | POA: Insufficient documentation

## 2010-12-28 DIAGNOSIS — E119 Type 2 diabetes mellitus without complications: Secondary | ICD-10-CM | POA: Insufficient documentation

## 2010-12-28 DIAGNOSIS — I120 Hypertensive chronic kidney disease with stage 5 chronic kidney disease or end stage renal disease: Secondary | ICD-10-CM | POA: Insufficient documentation

## 2010-12-28 DIAGNOSIS — E7800 Pure hypercholesterolemia, unspecified: Secondary | ICD-10-CM | POA: Insufficient documentation

## 2010-12-31 ENCOUNTER — Other Ambulatory Visit (HOSPITAL_COMMUNITY): Payer: Self-pay | Admitting: Nephrology

## 2010-12-31 DIAGNOSIS — N186 End stage renal disease: Secondary | ICD-10-CM

## 2011-01-08 ENCOUNTER — Ambulatory Visit (HOSPITAL_COMMUNITY)
Admission: RE | Admit: 2011-01-08 | Discharge: 2011-01-08 | Disposition: A | Discharge status: Home / Self Care | Payer: Medicare Other | Source: Ambulatory Visit | Attending: Nephrology | Admitting: Nephrology

## 2011-01-08 DIAGNOSIS — N186 End stage renal disease: Secondary | ICD-10-CM | POA: Insufficient documentation

## 2011-01-08 DIAGNOSIS — N2581 Secondary hyperparathyroidism of renal origin: Secondary | ICD-10-CM | POA: Insufficient documentation

## 2011-01-08 DIAGNOSIS — Z8673 Personal history of transient ischemic attack (TIA), and cerebral infarction without residual deficits: Secondary | ICD-10-CM | POA: Insufficient documentation

## 2011-01-08 DIAGNOSIS — D649 Anemia, unspecified: Secondary | ICD-10-CM | POA: Insufficient documentation

## 2011-01-08 DIAGNOSIS — Z4901 Encounter for fitting and adjustment of extracorporeal dialysis catheter: Secondary | ICD-10-CM | POA: Insufficient documentation

## 2011-01-08 DIAGNOSIS — E119 Type 2 diabetes mellitus without complications: Secondary | ICD-10-CM | POA: Insufficient documentation

## 2011-01-08 DIAGNOSIS — I509 Heart failure, unspecified: Secondary | ICD-10-CM | POA: Insufficient documentation

## 2011-01-08 DIAGNOSIS — I120 Hypertensive chronic kidney disease with stage 5 chronic kidney disease or end stage renal disease: Secondary | ICD-10-CM | POA: Insufficient documentation

## 2011-02-09 ENCOUNTER — Other Ambulatory Visit (HOSPITAL_COMMUNITY): Payer: Self-pay | Admitting: Nephrology

## 2011-02-09 DIAGNOSIS — N186 End stage renal disease: Secondary | ICD-10-CM

## 2011-02-26 ENCOUNTER — Ambulatory Visit (HOSPITAL_COMMUNITY): Admission: RE | Admit: 2011-02-26 | Payer: Medicare Other | Source: Ambulatory Visit

## 2011-03-02 ENCOUNTER — Encounter (HOSPITAL_COMMUNITY): Payer: Self-pay | Admitting: Pharmacy Technician

## 2011-03-03 ENCOUNTER — Ambulatory Visit (HOSPITAL_COMMUNITY)
Admission: RE | Admit: 2011-03-03 | Discharge: 2011-03-03 | Disposition: A | Discharge status: Home / Self Care | Payer: Medicare Other | Source: Ambulatory Visit | Attending: Nephrology | Admitting: Nephrology

## 2011-03-03 ENCOUNTER — Other Ambulatory Visit (HOSPITAL_COMMUNITY): Payer: Self-pay | Admitting: Nephrology

## 2011-03-03 DIAGNOSIS — N186 End stage renal disease: Secondary | ICD-10-CM | POA: Insufficient documentation

## 2011-03-03 DIAGNOSIS — E119 Type 2 diabetes mellitus without complications: Secondary | ICD-10-CM | POA: Insufficient documentation

## 2011-03-03 DIAGNOSIS — Y849 Medical procedure, unspecified as the cause of abnormal reaction of the patient, or of later complication, without mention of misadventure at the time of the procedure: Secondary | ICD-10-CM | POA: Insufficient documentation

## 2011-03-03 DIAGNOSIS — I120 Hypertensive chronic kidney disease with stage 5 chronic kidney disease or end stage renal disease: Secondary | ICD-10-CM | POA: Insufficient documentation

## 2011-03-03 DIAGNOSIS — Z8673 Personal history of transient ischemic attack (TIA), and cerebral infarction without residual deficits: Secondary | ICD-10-CM | POA: Insufficient documentation

## 2011-03-03 DIAGNOSIS — Z992 Dependence on renal dialysis: Secondary | ICD-10-CM | POA: Insufficient documentation

## 2011-03-03 DIAGNOSIS — T82898A Other specified complication of vascular prosthetic devices, implants and grafts, initial encounter: Secondary | ICD-10-CM | POA: Insufficient documentation

## 2011-03-03 MED ORDER — IOHEXOL 300 MG/ML  SOLN: 60.0000 mL | Freq: Once | INTRAMUSCULAR | Status: AC | PRN

## 2011-03-03 NOTE — H&P (Signed)
Stefanie [name] is an 65 y.o. female.   Chief Complaint: Decreased AVG function HPI: Decreased AVG function.  Past Medical History  Diagnosis Date  . Hypertension   . Hyperlipidemia   . Diabetes mellitus   . CVA (cerebral vascular accident)   . Protein malnutrition   . Secondary hyperparathyroidism   . Anemia   . End stage renal disease     Past Surgical History  Procedure Date  . Cataract extraction   . Tubal ligation   . Arteriovenous graft placement     BUE numerous times    Family History  Problem Relation Age of Onset  . Cardiomyopathy    . Heart disease Mother   . Diabetes Mother   . Diabetes Other   . Kidney disease Other    Social History:  reports that she quit smoking about 27 years ago. She does not have any smokeless tobacco history on file. She reports that she does not drink alcohol or use illicit drugs.  Allergies: No Known Allergies  Medications Prior to Admission  Medication Sig Dispense Refill  . amLODipine (NORVASC) 10 MG tablet Take 10 mg by mouth daily.        . calcium acetate (ELIPHOS) 667 MG tablet Take 667 mg by mouth 3 (three) times daily with meals.       . cinacalcet (SENSIPAR) 60 MG tablet Take 60 mg by mouth daily.        . enalapril (VASOTEC) 10 MG tablet Take 10 mg by mouth daily.        . insulin NPH-insulin regular (NOVOLIN 70/30) (70-30) 100 UNIT/ML injection Inject 20-25 Units into the skin 2 (two) times daily with a meal. 25 units in am 20 units in pm       . isosorbide mononitrate (IMDUR) 30 MG 24 hr tablet       . Multiple Vitamin (MULITIVITAMIN WITH MINERALS) TABS Take 1 tablet by mouth daily.        . pravastatin (PRAVACHOL) 40 MG tablet Take 40 mg by mouth daily.         No current facility-administered medications on file as of 03/03/2011.    No results found for this or any previous visit (from the past 48 hour(s)). No results found. ROS  There were no vitals taken for this visit. Physical Exam  Constitutional: She  is oriented to person, place, and time. She appears well-developed and well-nourished.  HENT:  Head: Normocephalic.  Cardiovascular: Normal rate, regular rhythm and normal heart sounds.   Respiratory: Effort normal and breath sounds normal.  GI: Soft.  Neurological: She is alert and oriented to person, place, and time.  Skin: Skin is warm and dry.  Psychiatric: She has a normal mood and affect.     Assessment/Plan Venous PTA  Stefanie [name],ART A 03/03/2011, 9:19 AM

## 2011-03-03 NOTE — Procedures (Signed)
PTA venous L thigh AVG 7 mm No comp

## 2011-03-18 DIAGNOSIS — D631 Anemia in chronic kidney disease: Secondary | ICD-10-CM | POA: Diagnosis not present

## 2011-03-18 DIAGNOSIS — N2581 Secondary hyperparathyroidism of renal origin: Secondary | ICD-10-CM | POA: Diagnosis not present

## 2011-03-18 DIAGNOSIS — N186 End stage renal disease: Secondary | ICD-10-CM | POA: Diagnosis not present

## 2011-03-18 DIAGNOSIS — D509 Iron deficiency anemia, unspecified: Secondary | ICD-10-CM | POA: Diagnosis not present

## 2011-04-03 DIAGNOSIS — N189 Chronic kidney disease, unspecified: Secondary | ICD-10-CM | POA: Diagnosis not present

## 2011-04-03 DIAGNOSIS — H33049 Retinal detachment with retinal dialysis, unspecified eye: Secondary | ICD-10-CM | POA: Diagnosis not present

## 2011-04-08 DIAGNOSIS — E1129 Type 2 diabetes mellitus with other diabetic kidney complication: Secondary | ICD-10-CM | POA: Diagnosis not present

## 2011-04-15 DIAGNOSIS — E1129 Type 2 diabetes mellitus with other diabetic kidney complication: Secondary | ICD-10-CM | POA: Diagnosis not present

## 2011-04-15 DIAGNOSIS — N186 End stage renal disease: Secondary | ICD-10-CM | POA: Diagnosis not present

## 2011-04-15 DIAGNOSIS — Z992 Dependence on renal dialysis: Secondary | ICD-10-CM | POA: Diagnosis not present

## 2011-04-16 DIAGNOSIS — N186 End stage renal disease: Secondary | ICD-10-CM | POA: Diagnosis not present

## 2011-04-17 DIAGNOSIS — N186 End stage renal disease: Secondary | ICD-10-CM | POA: Diagnosis not present

## 2011-04-17 DIAGNOSIS — D509 Iron deficiency anemia, unspecified: Secondary | ICD-10-CM | POA: Diagnosis not present

## 2011-04-17 DIAGNOSIS — D631 Anemia in chronic kidney disease: Secondary | ICD-10-CM | POA: Diagnosis not present

## 2011-04-17 DIAGNOSIS — N2581 Secondary hyperparathyroidism of renal origin: Secondary | ICD-10-CM | POA: Diagnosis not present

## 2011-04-23 DIAGNOSIS — L608 Other nail disorders: Secondary | ICD-10-CM | POA: Diagnosis not present

## 2011-04-23 DIAGNOSIS — E1159 Type 2 diabetes mellitus with other circulatory complications: Secondary | ICD-10-CM | POA: Diagnosis not present

## 2011-04-23 DIAGNOSIS — Q828 Other specified congenital malformations of skin: Secondary | ICD-10-CM | POA: Diagnosis not present

## 2011-05-14 DIAGNOSIS — N186 End stage renal disease: Secondary | ICD-10-CM | POA: Diagnosis not present

## 2011-05-15 DIAGNOSIS — D509 Iron deficiency anemia, unspecified: Secondary | ICD-10-CM | POA: Diagnosis not present

## 2011-05-15 DIAGNOSIS — N186 End stage renal disease: Secondary | ICD-10-CM | POA: Diagnosis not present

## 2011-05-15 DIAGNOSIS — N2581 Secondary hyperparathyroidism of renal origin: Secondary | ICD-10-CM | POA: Diagnosis not present

## 2011-05-15 DIAGNOSIS — D631 Anemia in chronic kidney disease: Secondary | ICD-10-CM | POA: Diagnosis not present

## 2011-05-26 DIAGNOSIS — M25559 Pain in unspecified hip: Secondary | ICD-10-CM | POA: Diagnosis not present

## 2011-05-29 ENCOUNTER — Encounter (HOSPITAL_COMMUNITY): Payer: Self-pay | Admitting: *Deleted

## 2011-05-29 ENCOUNTER — Emergency Department (HOSPITAL_COMMUNITY)
Admission: EM | Admit: 2011-05-29 | Discharge: 2011-05-29 | Disposition: A | Discharge status: Home / Self Care | Payer: Medicare Other | Attending: Emergency Medicine | Admitting: Emergency Medicine

## 2011-05-29 ENCOUNTER — Emergency Department (HOSPITAL_COMMUNITY): Payer: Medicare Other

## 2011-05-29 DIAGNOSIS — E119 Type 2 diabetes mellitus without complications: Secondary | ICD-10-CM | POA: Diagnosis not present

## 2011-05-29 DIAGNOSIS — I10 Essential (primary) hypertension: Secondary | ICD-10-CM | POA: Insufficient documentation

## 2011-05-29 DIAGNOSIS — M25559 Pain in unspecified hip: Secondary | ICD-10-CM | POA: Diagnosis not present

## 2011-05-29 DIAGNOSIS — Y92009 Unspecified place in unspecified non-institutional (private) residence as the place of occurrence of the external cause: Secondary | ICD-10-CM | POA: Insufficient documentation

## 2011-05-29 DIAGNOSIS — N186 End stage renal disease: Secondary | ICD-10-CM | POA: Diagnosis not present

## 2011-05-29 DIAGNOSIS — W010XXA Fall on same level from slipping, tripping and stumbling without subsequent striking against object, initial encounter: Secondary | ICD-10-CM | POA: Insufficient documentation

## 2011-05-29 DIAGNOSIS — S7000XA Contusion of unspecified hip, initial encounter: Secondary | ICD-10-CM | POA: Insufficient documentation

## 2011-05-29 DIAGNOSIS — E785 Hyperlipidemia, unspecified: Secondary | ICD-10-CM | POA: Insufficient documentation

## 2011-05-29 DIAGNOSIS — S300XXA Contusion of lower back and pelvis, initial encounter: Secondary | ICD-10-CM | POA: Diagnosis not present

## 2011-05-29 DIAGNOSIS — W19XXXA Unspecified fall, initial encounter: Secondary | ICD-10-CM

## 2011-05-29 LAB — CBC
Platelets: 120 10*3/uL — ABNORMAL LOW (ref 150–400)
RBC: 3.76 MIL/uL — ABNORMAL LOW (ref 3.87–5.11)
RDW: 18.9 % — ABNORMAL HIGH (ref 11.5–15.5)
WBC: 4.9 10*3/uL (ref 4.0–10.5)

## 2011-05-29 LAB — BASIC METABOLIC PANEL
CO2: 36 mEq/L — ABNORMAL HIGH (ref 19–32)
Calcium: 10.2 mg/dL (ref 8.4–10.5)
Chloride: 95 mEq/L — ABNORMAL LOW (ref 96–112)
Creatinine, Ser: 5.49 mg/dL — ABNORMAL HIGH (ref 0.50–1.10)
GFR calc Af Amer: 9 mL/min — ABNORMAL LOW (ref >90)
Sodium: 141 mEq/L (ref 135–145)

## 2011-05-29 MED ORDER — ACETAMINOPHEN 325 MG PO TABS
650.0000 mg | ORAL_TABLET | Freq: Once | ORAL | Status: AC
  Administered 2011-05-29: 650 mg via ORAL
  Filled 2011-05-29: qty 2

## 2011-05-29 NOTE — ED Notes (Signed)
The pt fell in her kitchen earlier today and now she has pain in her lt hip

## 2011-05-29 NOTE — Discharge Instructions (Signed)
Rest. Take tylenol/advil as need. Follow up with primary care doctor in 1 week if symptoms fail to improve/resolve. Return to ER if worse, worsening or severe pain, new symptoms, weak/faint, fevers, numbness/weakness, other concern.      Contusion A contusion is a deep bruise. Contusions are the result of an injury that caused bleeding under the skin. The contusion may turn blue, purple, or yellow. Minor injuries will give you a painless contusion, but more severe contusions may stay painful and swollen for a few weeks.  CAUSES  A contusion is usually caused by a blow, trauma, or direct force to an area of the body. SYMPTOMS   Swelling and redness of the injured area.   Bruising of the injured area.   Tenderness and soreness of the injured area.   Pain.  DIAGNOSIS  The diagnosis can be made by taking a history and physical exam. An X-ray, CT scan, or MRI may be needed to determine if there were any associated injuries, such as fractures. TREATMENT  Specific treatment will depend on what area of the body was injured. In general, the best treatment for a contusion is resting, icing, elevating, and applying cold compresses to the injured area. Over-the-counter medicines may also be recommended for pain control. Ask your caregiver what the best treatment is for your contusion. HOME CARE INSTRUCTIONS   Put ice on the injured area.   Put ice in a plastic bag.   Place a towel between your skin and the bag.   Leave the ice on for 15 to 20 minutes, 3 to 4 times a day.   Only take over-the-counter or prescription medicines for pain, discomfort, or fever as directed by your caregiver. Your caregiver may recommend avoiding anti-inflammatory medicines (aspirin, ibuprofen, and naproxen) for 48 hours because these medicines may increase bruising.   Rest the injured area.   If possible, elevate the injured area to reduce swelling.  SEEK IMMEDIATE MEDICAL CARE IF:   You have increased bruising  or swelling.   You have pain that is getting worse.   Your swelling or pain is not relieved with medicines.  MAKE SURE YOU:   Understand these instructions.   Will watch your condition.   Will get help right away if you are not doing well or get worse.  Document Released: 12/10/2004 Document Revised: 02/19/2011 Document Reviewed: 01/05/2011 [facility] Patient Information 2012 [place], [place].      Home Safety and Preventing Falls Falls are a leading cause of injury and while they affect all age groups, falls have greater short-term and long-term impact on older age groups. However, falls should not be a part of life or aging. It is possible for individuals and their families to use preventive measures to significantly decrease the likelihood that anyone, especially an older adult, will fall. There are many simple measures which can make your home safer with respect to preventing falls. The following actions can help reduce falls among all members of your family and are especially important as you age, when your balance, lower limb strength, coordination, and eyesight may be declining. The use of preventive measures will help to reduce you and your family's risk of falls and serious medical consequences. OUTDOORS  Repair cracks and edges of walkways and driveways.   Remove high doorway thresholds and trim shrubbery on the main path into your home.   Ensure there is good outside lighting at main entrances and along main walkways.   Clear walkways of tools, rocks, debris,  and clutter.   Check that handrails are not broken and are securely fastened. Both sides of steps should have handrails.   In the garage, be attentive to and clean up grease or oil spills on the cement. This can make the surface extremely slippery.   In winter, have leaves, snow, and ice cleared regularly.   Use sand or salt on walkways during winter months.  BATHROOM  Install grab bars by the toilet and in the  tub and shower.   Use non-skid mats or decals in the tub or shower.   If unable to easily stand unsupported while showering, place a plastic non slip stool in the shower to sit on when needed.   Install night lights.   Keep floors dry and clean up all water on the floor immediately.   Remove soap buildup in tub or shower on a regular basis.   Secure bath mats with non-slip, double-sided rug tape.   Remove tripping hazards from the floors.  BEDROOMS  Install night lights.   Do not use oversized bedding.   Make sure a bedside light is easy to reach.   Keep a telephone by your bedside.   Make sure that you can get in and out of your bed easily.   Have a firm chair, with side arms, to use for getting dressed.   Remove clutter from around closets.   Store clothing, bed coverings, and other household items where you can reach them comfortably.   Remove tripping hazards from the floor.  LIVING AREAS AND STAIRWAYS  Turn on lights to avoid having to walk through dark areas.   Keep lighting uniform in each room. Place brighter lightbulbs in darker areas, including stairways.   Replace lightbulbs that burn out in stairways immediately.   Arrange furniture to provide for clear pathways.   Keep furniture in the same place.   Eliminate or tape down electrical cables in high traffic areas.   Place handrails on both sides of stairways. Use handrails when going up or down stairs.   Most falls occur on the top or bottom 3 steps.   Fix any loose handrails. Make sure handrails on both sides of the stairways are as long as the stairs.   Remove all walkway obstacles.   Coil or tape electrical cords off to the side of walking areas and out of the way. If using many extension cords, have an electrician put in a new wall outlet to reduce or eliminate them.   Make sure spills are cleaned up quickly and allow time for drying before walking on freshly cleaned floors.   Firmly attach  carpet with non-skid or two-sided tape.   Keep frequently used items within easy reach.   Remove tripping hazards such as throw rugs and clutter in walkways. Never leave objects on stairs.   Get rid of throw rugs elsewhere if possible.   Eliminate uneven floor surfaces.   Make sure couches and chairs are easy to get into and out of.   Check carpeting to make sure it is firmly attached along stairs.   Make repairs to worn or loose carpet promptly.   Select a carpet pattern that does not visually hide the edge of steps.   Avoid placing throw rugs or scatter rugs at the top or bottom of stairways, or properly secure with carpet tape to prevent slippage.   Have an electrician put in a light switch at the top and bottom of the stairs.  Get light switches that glow.   Avoid the following practices: hurrying, inattention, obscured vision, carrying large loads, and wearing slip-on shoes.   Be aware of all pets.  KITCHEN  Place items that are used frequently, such as dishes and food, within easy reach.   Keep handles on pots and pans toward the center of the stove. Use back burners when possible.   Make sure spills are cleaned up quickly and allow time for drying.   Avoid walking on wet floors.   Avoid hot utensils and knives.   Position shelves so they are not too high or low.   Place commonly used objects within easy reach.   If necessary, use a sturdy step stool with a grab bar when reaching.   Make sure electrical cables are out of the way.   Do not use floor polish or wax that makes floors slippery.  OTHER HOME FALL PREVENTION STRATEGIES  Wear low heel or rubber sole shoes that are supportive and fit well.   Wear closed toe shoes.   Know and watch for side effects of medications. Have your caregiver or pharmacist look at all your medicines, even over-the-counter medicines. Some medicines can make you sleepy or dizzy.   Exercise regularly. Exercise makes you  stronger and improves your balance and coordination.   Limit use of alcohol.   Use eyeglasses if necessary and keep them clean. Have your vision checked every year.   Organize your household in a manner that minimizes the need to walk distances when hurried, or go up and down stairs unnecessarily. For example, have a phone placed on at least each floor of your home. If possible, have a phone beside each sitting or lying area where you spend the most time at home. Keep emergency numbers posted at all phones.   Use non-skid floor wax.   When using a ladder, make sure:   The base is firm.   All ladder feet are on level ground.   The ladder is angled against the wall properly.   When climbing a ladder, face the ladder and hold the ladder rungs firmly.   If reaching, always keep your hips and body weight centered between the rails.   When using a stepladder, make sure it is fully opened and both spreaders are firmly locked.   Do not climb a closed stepladder.   Avoid climbing beyond the second step from the top of a stepladder and the 4th rung from the top of an extension ladder.   Learn and use mobility aids as needed.   Change positions slowly. Arise slowly from sitting and lying positions. Sit on the edge of your bed before getting to your feet.   If you have a history of falls, ask someone to add color or contrast paint or tape to grab bars and handrails in your home.   If you have a history of falls, ask someone to place contrasting color strips on first and last steps.   Install an electrical emergency response system if you need one, and know how to use it.   If you have a medical or other condition that causes you to have limited physical strength, it is important that you reach out to family and friends for occasional help.  FOR CHILDREN:  If young children are in the home, use safety gates. At the top of stairs use screw-mounted gates; use pressure-mounted gates for the  bottom of the stairs and doorways between rooms.  Young children should be taught to descend stairs on their stomachs, feet first, and later using the handrail.   Keep drawers fully closed to prevent them from being climbed on or pulled out entirely.   Move chairs, cribs, beds and other furniture away from windows.   Consider installing window guards on windows ground floor and up, unless they are emergency fire exits. Make sure they have easy release mechanisms.   Consider installing special locks that only allow the window to be opened to a certain height.   Never rely on window screens to prevent falls.   Never leave babies alone on changing tables, beds or sofas. Use a changing table that has a restraining strap.   When a child can pull to a standing position, the crib mattress should be adjusted to its lowest position. There should be at least 26 inches between the top rails of the crib drop side and the mattress. Toys, bumper pads, and other objects that can be used as steps to climb out should be removed from the crib.   On bunk beds never allow a child under age 89 to sleep on the top bunk. For older children, if the upper bunk is not against a wall, use guard rails on both sides. No matter how old a child is, keep the guard rails in place on the top bunk since children roll during sleep. Do not permit horseplay on bunks.   Grass and soil surfaces beneath backyard playground equipment should be replaced with hardwood chips, shredded wood mulch, sand, pea gravel, rubber, crushed stone, or another safer material at depths of at least 9 to 12 inches.   When riding bikes or using skates, skateboards, skis, or snowboards, require children to wear helmets. Look for those that have stickers stating that they meet or exceed safety standards.   Vertical posts or pickets in deck, balcony, and stairway railings should be no more than 3 1/2 inches apart if a young baby will have access to the  area. The space between horizontal rails or bars, and between the floor and the first horizontal rail or bar, should be no more than 3 1/2 inches.  Document Released: 02/20/2002 Document Revised: 02/19/2011 Document Reviewed: 12/20/2008 [facility] Patient Information 2012 [place].

## 2011-05-29 NOTE — ED Provider Notes (Cosign Needed)
History     CSN: [id number]  Arrival date & time 05/29/11  1650   First MD Initiated Contact with Patient 05/29/11 1918      Chief Complaint  Patient presents with  . Hip Pain    (Consider location/radiation/quality/duration/timing/severity/associated sxs/prior treatment) Patient is a 66 y.o. female presenting with hip pain. The history is provided by the patient.  Hip Pain Pertinent negatives include no chest pain, no abdominal pain, no headaches and no shortness of breath.  pt states earlier today, slipped in kitchen on wet floor, falling onto left buttock. Ambulatory since. C/o left hip/buttock area pain. Constant, dull, non radiating, worse w palpation. No radicular pain. No numbness or weakness. Denies faintness or dizziness. No loc. No head injury or headache. No neck or back pain. Denies other injury. States recent health at baseline.   Past Medical History  Diagnosis Date  . Hypertension   . Hyperlipidemia   . Diabetes mellitus   . CVA (cerebral vascular accident)   . Protein malnutrition   . Secondary hyperparathyroidism   . Anemia   . End stage renal disease     Past Surgical History  Procedure Date  . Cataract extraction   . Tubal ligation   . Arteriovenous graft placement     BUE numerous times    Family History  Problem Relation Age of Onset  . Cardiomyopathy    . Heart disease Mother   . Diabetes Mother   . Diabetes Other   . Kidney disease Other     History  Substance Use Topics  . Smoking status: Former Smoker    Quit date: 03/17/1983  . Smokeless tobacco: Not on file  . Alcohol Use: No    OB History    Grav Para Term Preterm Abortions TAB SAB Ect Mult Living                  Review of Systems  Constitutional: Negative for fever and chills.  HENT: Negative for neck pain and neck stiffness.   Eyes: Negative for redness.  Respiratory: Negative for cough and shortness of breath.   Cardiovascular: Negative for chest pain, palpitations  and leg swelling.  Gastrointestinal: Negative for abdominal pain.  Genitourinary: Negative for dysuria and flank pain.  Musculoskeletal: Negative for back pain.  Skin: Negative for rash.  Neurological: Negative for weakness, numbness and headaches.  Hematological: Does not bruise/bleed easily.  Psychiatric/Behavioral: Negative for confusion.    Allergies  Review of patient's allergies indicates no known allergies.  Home Medications   Current Outpatient Rx  Name Route Sig Dispense Refill  . AMLODIPINE BESYLATE 10 MG PO TABS Oral Take 10 mg by mouth daily.      Marland Kitchen CALCIUM ACETATE (PHOS BINDER) 667 MG PO TABS Oral Take 667 mg by mouth 3 (three) times daily with meals.     Marland Kitchen CINACALCET HCL 60 MG PO TABS Oral Take 60 mg by mouth daily.      . ENALAPRIL MALEATE 10 MG PO TABS Oral Take 10 mg by mouth daily.      . INSULIN ISOPHANE & REGULAR (70-30) 100 UNIT/ML [place] SUSP Subcutaneous Inject 20-25 Units into the skin 2 (two) times daily with a meal. 25 units in am 20 units in pm     . ISOSORBIDE MONONITRATE ER 30 MG PO TB24 Oral Take 30 mg by mouth daily.     . ADULT MULTIVITAMIN W/MINERALS CH Oral Take 1 tablet by mouth daily.      Marland Kitchen  PRAVASTATIN SODIUM 40 MG PO TABS Oral Take 40 mg by mouth daily.        BP 92/46  Pulse 73  Temp(Src) 97.9 F (36.6 C) (Oral)  Resp 18  SpO2 97%  Physical Exam  Nursing note and vitals reviewed. Constitutional: She is oriented to person, place, and time. She appears well-developed and well-nourished. No distress.  HENT:  Head: Atraumatic.  Eyes: Conjunctivae are normal. Pupils are equal, round, and reactive to light. No scleral icterus.  Neck: Normal range of motion. Neck supple. No tracheal deviation present.  Cardiovascular: Normal rate, regular rhythm, normal heart sounds and intact distal pulses.   Pulmonary/Chest: Effort normal and breath sounds normal. No respiratory distress.  Abdominal: Soft. Normal appearance and bowel sounds are normal. She  exhibits no distension and no mass. There is no tenderness. There is no rebound and no guarding.  Genitourinary:       No cva tenderness  Musculoskeletal: She exhibits no edema and no tenderness.  Neurological: She is alert and oriented to person, place, and time.       Motor intact bil.   Skin: Skin is warm and dry. No rash noted.  Psychiatric: She has a normal mood and affect.    ED Course  Procedures (including critical care time)  Labs Reviewed - No data to display Dg Hip Complete Left  05/29/2011  *RADIOLOGY REPORT*  Clinical Data: Fall left hip pain there is  LEFT HIP - COMPLETE 2+ VIEW  Comparison: None of  Findings: Hips are located.  Dedicated view of the left hip demonstrates no femoral neck fracture.  Vascular calcifications are noted.  No pelvic fracture sacral fracture.  IMPRESSION:  1.  No pelvic fracture. 2.  No fracture.  Original Report Authenticated By: Suzy Bouchard, M.D.     Results for orders placed during the hospital encounter of 0000000  BASIC METABOLIC PANEL      Component Value Range   Sodium 141  135 - 145 (mEq/L)   Potassium 3.9  3.5 - 5.1 (mEq/L)   Chloride 95 (*) 96 - 112 (mEq/L)   CO2 36 (*) 19 - 32 (mEq/L)   Glucose, Bld 71  70 - 99 (mg/dL)   BUN 18  6 - 23 (mg/dL)   Creatinine, Ser 5.49 (*) 0.50 - 1.10 (mg/dL)   Calcium 10.2  8.4 - 10.5 (mg/dL)   GFR calc non Af Amer 7 (*) >90 (mL/min)   GFR calc Af Amer 9 (*) >90 (mL/min)  CBC      Component Value Range   WBC 4.9  4.0 - 10.5 (K/uL)   RBC 3.76 (*) 3.87 - 5.11 (MIL/uL)   Hemoglobin 11.8 (*) 12.0 - 15.0 (g/dL)   HCT 37.5  36.0 - 46.0 (%)   MCV 99.7  78.0 - 100.0 (fL)   MCH 31.4  26.0 - 34.0 (pg)   MCHC 31.5  30.0 - 36.0 (g/dL)   RDW 18.9 (*) 11.5 - 15.5 (%)   Platelets 120 (*) 150 - 400 (K/uL)   Dg Hip Complete Left  05/29/2011  *RADIOLOGY REPORT*  Clinical Data: Fall left hip pain there is  LEFT HIP - COMPLETE 2+ VIEW  Comparison: None of  Findings: Hips are located.  Dedicated view of  the left hip demonstrates no femoral neck fracture.  Vascular calcifications are noted.  No pelvic fracture sacral fracture.  IMPRESSION:  1.  No pelvic fracture. 2.  No fracture.  Original Report Authenticated By: Suzy Bouchard, M.D.  MDM  Tylenol po. Discussed xrays w pt. Spine nt. Good rom at left hip. Distal pulses palp.   Although pts hx c/w mechanical fall, initial triage bp noted low. Pt denies faintness or dizziness. Denies blood loss or melena. No gu c/o. No fever or chills. Will recheck bp/vitals and check basic labs.    Pt w hx esrd/hd. Had normal hd today. Denies faintness or dizziness. On review prior charts, office visit, preop note, pts prior bp in 90-100/50 range at baseline.    Mirna Mires, MD 05/29/11 2034

## 2011-06-05 ENCOUNTER — Other Ambulatory Visit (HOSPITAL_COMMUNITY): Payer: Self-pay | Admitting: Nephrology

## 2011-06-05 DIAGNOSIS — N186 End stage renal disease: Secondary | ICD-10-CM

## 2011-06-06 ENCOUNTER — Other Ambulatory Visit (HOSPITAL_COMMUNITY): Payer: Medicare Other

## 2011-06-06 ENCOUNTER — Ambulatory Visit (HOSPITAL_COMMUNITY)
Admission: RE | Admit: 2011-06-06 | Discharge: 2011-06-06 | Disposition: A | Discharge status: Home / Self Care | Payer: Medicare Other | Source: Ambulatory Visit | Attending: Nephrology | Admitting: Nephrology

## 2011-06-06 ENCOUNTER — Other Ambulatory Visit (HOSPITAL_COMMUNITY): Payer: Self-pay | Admitting: Nephrology

## 2011-06-06 DIAGNOSIS — Z8673 Personal history of transient ischemic attack (TIA), and cerebral infarction without residual deficits: Secondary | ICD-10-CM | POA: Insufficient documentation

## 2011-06-06 DIAGNOSIS — Z79899 Other long term (current) drug therapy: Secondary | ICD-10-CM | POA: Insufficient documentation

## 2011-06-06 DIAGNOSIS — T82598A Other mechanical complication of other cardiac and vascular devices and implants, initial encounter: Secondary | ICD-10-CM | POA: Diagnosis not present

## 2011-06-06 DIAGNOSIS — I120 Hypertensive chronic kidney disease with stage 5 chronic kidney disease or end stage renal disease: Secondary | ICD-10-CM | POA: Diagnosis not present

## 2011-06-06 DIAGNOSIS — N186 End stage renal disease: Secondary | ICD-10-CM

## 2011-06-06 DIAGNOSIS — T82898A Other specified complication of vascular prosthetic devices, implants and grafts, initial encounter: Secondary | ICD-10-CM | POA: Diagnosis not present

## 2011-06-06 DIAGNOSIS — N2581 Secondary hyperparathyroidism of renal origin: Secondary | ICD-10-CM | POA: Insufficient documentation

## 2011-06-06 DIAGNOSIS — Y832 Surgical operation with anastomosis, bypass or graft as the cause of abnormal reaction of the patient, or of later complication, without mention of misadventure at the time of the procedure: Secondary | ICD-10-CM | POA: Insufficient documentation

## 2011-06-06 DIAGNOSIS — E119 Type 2 diabetes mellitus without complications: Secondary | ICD-10-CM | POA: Insufficient documentation

## 2011-06-06 DIAGNOSIS — Z794 Long term (current) use of insulin: Secondary | ICD-10-CM | POA: Insufficient documentation

## 2011-06-06 DIAGNOSIS — E785 Hyperlipidemia, unspecified: Secondary | ICD-10-CM | POA: Diagnosis not present

## 2011-06-06 LAB — POTASSIUM: Potassium: 4.5 mEq/L (ref 3.5–5.1)

## 2011-06-06 MED ORDER — IOHEXOL 300 MG/ML  SOLN
100.0000 mL | Freq: Once | INTRAMUSCULAR | Status: AC | PRN
  Administered 2011-06-06: 50 mL via INTRAVENOUS

## 2011-06-06 MED ORDER — ALTEPLASE 100 MG IV SOLR
4.0000 mg | INTRAVENOUS | Status: AC
  Administered 2011-06-06: 4 mg
  Filled 2011-06-06: qty 4

## 2011-06-06 MED ORDER — MIDAZOLAM HCL 2 MG/2ML IJ SOLN
INTRAMUSCULAR | Status: AC
  Filled 2011-06-06: qty 4

## 2011-06-06 MED ORDER — MIDAZOLAM HCL 5 MG/5ML IJ SOLN
INTRAMUSCULAR | Status: DC | PRN
  Administered 2011-06-06: 1 mg via INTRAVENOUS

## 2011-06-06 MED ORDER — FENTANYL CITRATE 0.05 MG/ML IJ SOLN
INTRAMUSCULAR | Status: DC | PRN
  Administered 2011-06-06: 50 ug via INTRAVENOUS

## 2011-06-06 MED ORDER — HEPARIN SODIUM (PORCINE) 1000 UNIT/ML IJ SOLN
INTRAMUSCULAR | Status: AC
  Administered 2011-06-06: 3 [IU]
  Filled 2011-06-06: qty 1

## 2011-06-06 MED ORDER — FENTANYL CITRATE 0.05 MG/ML IJ SOLN
INTRAMUSCULAR | Status: AC
  Filled 2011-06-06: qty 4

## 2011-06-06 NOTE — H&P (Signed)
[name] Pol[place] is an 66 y.o. female.   Chief Complaint: Clotted L thigh graft HPI: 66 y/o F with ESRD on HD who presents with clotted L thigh graft.  Patient last received dialysis on Wed (3/20).  Currently without complaint.  No interval change in health status.  Denies CP or SOB.  Graft is non painful.  No fever or chills.  Past Medical History  Diagnosis Date  . Hypertension   . Hyperlipidemia   . Diabetes mellitus   . CVA (cerebral vascular accident)   . Protein malnutrition   . Secondary hyperparathyroidism   . Anemia   . End stage renal disease     Past Surgical History  Procedure Date  . Cataract extraction   . Tubal ligation   . Arteriovenous graft placement     BUE numerous times    Family History  Problem Relation Age of Onset  . Cardiomyopathy    . Heart disease Mother   . Diabetes Mother   . Diabetes Other   . Kidney disease Other    Social History:  reports that she quit smoking about 28 years ago. She does not have any smokeless tobacco history on file. She reports that she does not drink alcohol or use illicit drugs.  Allergies: No Known Allergies  Medications Prior to Admission  Medication Sig Dispense Refill  . amLODipine (NORVASC) 10 MG tablet Take 10 mg by mouth daily.        . calcium acetate (ELIPHOS) 667 MG tablet Take 667 mg by mouth 3 (three) times daily with meals.       . cinacalcet (SENSIPAR) 60 MG tablet Take 60 mg by mouth daily.        . enalapril (VASOTEC) 10 MG tablet Take 10 mg by mouth daily.        . insulin NPH-insulin regular (NOVOLIN 70/30) (70-30) 100 UNIT/ML injection Inject 20-25 Units into the skin 2 (two) times daily with a meal. 25 units in am 20 units in pm       . isosorbide mononitrate (IMDUR) 30 MG 24 hr tablet Take 30 mg by mouth daily.       . Multiple Vitamin (MULITIVITAMIN WITH MINERALS) TABS Take 1 tablet by mouth daily.        . pravastatin (PRAVACHOL) 40 MG tablet Take 40 mg by mouth daily.         Medications  Prior to Admission  Medication Dose Route Frequency Provider Last Rate Last Dose  . alteplase (ACTIVASE) injection 4 mg  4 mg Intracatheter STAT Donetta Potts, MD        No results found for this or any previous visit (from the past 65 hour(s)). No results found.  Review of Systems  Constitutional: Negative.   Respiratory: Negative.   Cardiovascular: Negative.     Blood pressure 126/60, pulse 57, temperature 97.8 F (36.6 C), resp. rate 14, SpO2 98.00%. Physical Exam  Constitutional: She appears well-developed and well-nourished.  Cardiovascular: Normal rate and regular rhythm.   Respiratory: Effort normal.       Decreased at the bilateral bases.   Psychiatric: She has a normal mood and affect.     Assessment/Plan 66 y/o F with h/o ESRD on HD with clotted L thigh graft who presents for a declot procedure.  No change in patient's health status.  Informed written consent obtained after a discussion of the benefits, risks (including but not limited to bleeding/vessel injury, infection, technical failure requiring placement of  HD catheter) and alternatives to treatment  Sandi Mariscal 06/06/2011, 7:48 AM

## 2011-06-06 NOTE — Procedures (Signed)
Technically successful declot of left thigh graft.  No immediate post procedural complications.

## 2011-06-06 NOTE — Discharge Instructions (Signed)
Moderate Sedation, Adult Moderate sedation is given to help you relax or even sleep through a procedure. You may remain sleepy, be clumsy, or have poor balance for several hours following this procedure. Arrange for a responsible adult, family member, or friend to take you home. A responsible adult should stay with you for at least 24 hours or until the medicines have worn off.  Do not participate in any activities where you could become injured for the next 24 hours, or until you feel normal again. Do not:   Drive.   Swim.   Ride a bicycle.   Operate heavy machinery.   Cook.   Use power tools.   Climb ladders.   Work at [employer].   Do not make important decisions or sign legal documents until you are improved.   Vomiting may occur if you eat too soon. When you can drink without vomiting, try water, juice, or soup. Try solid foods if you feel little or no nausea.   Only take over-the-counter or prescription medications for pain, discomfort, or fever as directed by your caregiver.If pain medications have been prescribed for you, ask your caregiver how soon it is safe to take them.   Make sure you and your family fully understands everything about the medication given to you. Make sure you understand what side effects may occur.   You should not drink alcohol, take sleeping pills, or medications that cause drowsiness for at least 24 hours.   If you smoke, do not smoke alone.   If you are feeling better, you may resume normal activities 24 hours after receiving sedation.   Keep all appointments as scheduled. Follow all instructions.   Ask questions if you do not understand.  SEEK MEDICAL CARE IF:   Your skin is pale or bluish in color.   You continue to feel sick to your stomach (nauseous) or throw up (vomit).   Your pain is getting worse and not helped by medication.   You have bleeding or swelling.   You are still sleepy or feeling clumsy after 24 hours.

## 2011-06-08 LAB — GLUCOSE, CAPILLARY: Glucose-Capillary: 100 mg/dL — ABNORMAL HIGH (ref 70–99)

## 2011-06-14 DIAGNOSIS — N186 End stage renal disease: Secondary | ICD-10-CM | POA: Diagnosis not present

## 2011-06-15 DIAGNOSIS — N186 End stage renal disease: Secondary | ICD-10-CM | POA: Diagnosis not present

## 2011-06-15 DIAGNOSIS — N2581 Secondary hyperparathyroidism of renal origin: Secondary | ICD-10-CM | POA: Diagnosis not present

## 2011-06-15 DIAGNOSIS — D509 Iron deficiency anemia, unspecified: Secondary | ICD-10-CM | POA: Diagnosis not present

## 2011-06-15 DIAGNOSIS — D631 Anemia in chronic kidney disease: Secondary | ICD-10-CM | POA: Diagnosis not present

## 2011-06-23 DIAGNOSIS — R269 Unspecified abnormalities of gait and mobility: Secondary | ICD-10-CM | POA: Diagnosis not present

## 2011-06-23 DIAGNOSIS — I6350 Cerebral infarction due to unspecified occlusion or stenosis of unspecified cerebral artery: Secondary | ICD-10-CM | POA: Diagnosis not present

## 2011-07-08 DIAGNOSIS — E1129 Type 2 diabetes mellitus with other diabetic kidney complication: Secondary | ICD-10-CM | POA: Diagnosis not present

## 2011-07-14 DIAGNOSIS — I743 Embolism and thrombosis of arteries of the lower extremities: Secondary | ICD-10-CM | POA: Diagnosis not present

## 2011-07-14 DIAGNOSIS — Z1231 Encounter for screening mammogram for malignant neoplasm of breast: Secondary | ICD-10-CM | POA: Diagnosis not present

## 2011-07-14 DIAGNOSIS — N186 End stage renal disease: Secondary | ICD-10-CM | POA: Diagnosis not present

## 2011-07-14 DIAGNOSIS — Z01419 Encounter for gynecological examination (general) (routine) without abnormal findings: Secondary | ICD-10-CM | POA: Diagnosis not present

## 2011-07-14 DIAGNOSIS — I63239 Cerebral infarction due to unspecified occlusion or stenosis of unspecified carotid arteries: Secondary | ICD-10-CM | POA: Diagnosis not present

## 2011-07-15 DIAGNOSIS — D631 Anemia in chronic kidney disease: Secondary | ICD-10-CM | POA: Diagnosis not present

## 2011-07-15 DIAGNOSIS — N2581 Secondary hyperparathyroidism of renal origin: Secondary | ICD-10-CM | POA: Diagnosis not present

## 2011-07-15 DIAGNOSIS — D509 Iron deficiency anemia, unspecified: Secondary | ICD-10-CM | POA: Diagnosis not present

## 2011-07-15 DIAGNOSIS — N186 End stage renal disease: Secondary | ICD-10-CM | POA: Diagnosis not present

## 2011-07-29 DIAGNOSIS — M79609 Pain in unspecified limb: Secondary | ICD-10-CM | POA: Diagnosis not present

## 2011-07-29 DIAGNOSIS — B351 Tinea unguium: Secondary | ICD-10-CM | POA: Diagnosis not present

## 2011-08-06 DIAGNOSIS — M5137 Other intervertebral disc degeneration, lumbosacral region: Secondary | ICD-10-CM | POA: Diagnosis not present

## 2011-08-06 DIAGNOSIS — M25559 Pain in unspecified hip: Secondary | ICD-10-CM | POA: Diagnosis not present

## 2011-08-06 DIAGNOSIS — M51379 Other intervertebral disc degeneration, lumbosacral region without mention of lumbar back pain or lower extremity pain: Secondary | ICD-10-CM | POA: Diagnosis not present

## 2011-08-11 DIAGNOSIS — E1129 Type 2 diabetes mellitus with other diabetic kidney complication: Secondary | ICD-10-CM | POA: Diagnosis not present

## 2011-08-11 DIAGNOSIS — E1169 Type 2 diabetes mellitus with other specified complication: Secondary | ICD-10-CM | POA: Diagnosis not present

## 2011-08-11 DIAGNOSIS — I6789 Other cerebrovascular disease: Secondary | ICD-10-CM | POA: Diagnosis not present

## 2011-08-11 DIAGNOSIS — N186 End stage renal disease: Secondary | ICD-10-CM | POA: Diagnosis not present

## 2011-08-11 DIAGNOSIS — E1149 Type 2 diabetes mellitus with other diabetic neurological complication: Secondary | ICD-10-CM | POA: Diagnosis not present

## 2011-08-11 DIAGNOSIS — E1142 Type 2 diabetes mellitus with diabetic polyneuropathy: Secondary | ICD-10-CM | POA: Diagnosis not present

## 2011-08-14 DIAGNOSIS — N186 End stage renal disease: Secondary | ICD-10-CM | POA: Diagnosis not present

## 2011-08-17 DIAGNOSIS — N186 End stage renal disease: Secondary | ICD-10-CM | POA: Diagnosis not present

## 2011-08-17 DIAGNOSIS — E213 Hyperparathyroidism, unspecified: Secondary | ICD-10-CM | POA: Diagnosis not present

## 2011-08-17 DIAGNOSIS — N2581 Secondary hyperparathyroidism of renal origin: Secondary | ICD-10-CM | POA: Diagnosis not present

## 2011-08-17 DIAGNOSIS — D631 Anemia in chronic kidney disease: Secondary | ICD-10-CM | POA: Diagnosis not present

## 2011-08-17 DIAGNOSIS — D509 Iron deficiency anemia, unspecified: Secondary | ICD-10-CM | POA: Diagnosis not present

## 2011-08-18 DIAGNOSIS — M25559 Pain in unspecified hip: Secondary | ICD-10-CM | POA: Diagnosis not present

## 2011-08-25 DIAGNOSIS — M25559 Pain in unspecified hip: Secondary | ICD-10-CM | POA: Diagnosis not present

## 2011-08-26 ENCOUNTER — Other Ambulatory Visit (HOSPITAL_COMMUNITY): Payer: Self-pay | Admitting: Nephrology

## 2011-08-26 ENCOUNTER — Encounter (HOSPITAL_COMMUNITY): Payer: Self-pay

## 2011-08-26 ENCOUNTER — Ambulatory Visit (HOSPITAL_COMMUNITY)
Admission: RE | Admit: 2011-08-26 | Discharge: 2011-08-26 | Disposition: A | Discharge status: Home / Self Care | Payer: Medicare Other | Source: Ambulatory Visit | Attending: Nephrology | Admitting: Nephrology

## 2011-08-26 ENCOUNTER — Other Ambulatory Visit (HOSPITAL_COMMUNITY): Payer: Self-pay | Admitting: Interventional Radiology

## 2011-08-26 DIAGNOSIS — T82898A Other specified complication of vascular prosthetic devices, implants and grafts, initial encounter: Secondary | ICD-10-CM | POA: Insufficient documentation

## 2011-08-26 DIAGNOSIS — N186 End stage renal disease: Secondary | ICD-10-CM | POA: Diagnosis not present

## 2011-08-26 DIAGNOSIS — Z992 Dependence on renal dialysis: Secondary | ICD-10-CM | POA: Diagnosis not present

## 2011-08-26 DIAGNOSIS — Y849 Medical procedure, unspecified as the cause of abnormal reaction of the patient, or of later complication, without mention of misadventure at the time of the procedure: Secondary | ICD-10-CM | POA: Insufficient documentation

## 2011-08-26 DIAGNOSIS — I120 Hypertensive chronic kidney disease with stage 5 chronic kidney disease or end stage renal disease: Secondary | ICD-10-CM | POA: Insufficient documentation

## 2011-08-26 DIAGNOSIS — E119 Type 2 diabetes mellitus without complications: Secondary | ICD-10-CM | POA: Diagnosis not present

## 2011-08-26 DIAGNOSIS — I771 Stricture of artery: Secondary | ICD-10-CM

## 2011-08-26 DIAGNOSIS — N2581 Secondary hyperparathyroidism of renal origin: Secondary | ICD-10-CM | POA: Insufficient documentation

## 2011-08-26 LAB — POCT I-STAT 4, (NA,K, GLUC, HGB,HCT)
Glucose, Bld: 93 mg/dL (ref 70–99)
HCT: 35 % — ABNORMAL LOW (ref 36.0–46.0)
Hemoglobin: 11.9 g/dL — ABNORMAL LOW (ref 12.0–15.0)
Potassium: 4.9 mEq/L (ref 3.5–5.1)
Sodium: 134 mEq/L — ABNORMAL LOW (ref 135–145)

## 2011-08-26 MED ORDER — HEPARIN SODIUM (PORCINE) 1000 UNIT/ML IJ SOLN
INTRAMUSCULAR | Status: AC | PRN
  Administered 2011-08-26: 3000 [IU] via INTRAVENOUS

## 2011-08-26 MED ORDER — FENTANYL CITRATE 0.05 MG/ML IJ SOLN
INTRAMUSCULAR | Status: AC
  Filled 2011-08-26: qty 4

## 2011-08-26 MED ORDER — FENTANYL CITRATE 0.05 MG/ML IJ SOLN
INTRAMUSCULAR | Status: AC | PRN
  Administered 2011-08-26 (×2): 25 ug via INTRAVENOUS

## 2011-08-26 MED ORDER — IOHEXOL 300 MG/ML  SOLN
100.0000 mL | Freq: Once | INTRAMUSCULAR | Status: AC | PRN
  Administered 2011-08-26: 80 mL via INTRAVENOUS

## 2011-08-26 MED ORDER — MIDAZOLAM HCL 5 MG/5ML IJ SOLN
INTRAMUSCULAR | Status: AC | PRN
  Administered 2011-08-26 (×4): 0.5 mg via INTRAVENOUS

## 2011-08-26 MED ORDER — HEPARIN SODIUM (PORCINE) 1000 UNIT/ML IJ SOLN
INTRAMUSCULAR | Status: AC
  Filled 2011-08-26: qty 1

## 2011-08-26 MED ORDER — ALTEPLASE 100 MG IV SOLR
2.0000 mg | Freq: Once | INTRAVENOUS | Status: AC
  Administered 2011-08-26: 2 mg
  Filled 2011-08-26: qty 2

## 2011-08-26 MED ORDER — MIDAZOLAM HCL 2 MG/2ML IJ SOLN
INTRAMUSCULAR | Status: AC
  Filled 2011-08-26: qty 4

## 2011-08-26 NOTE — Discharge Instructions (Signed)
Moderate Sedation, Adult Moderate sedation is given to help you relax or even sleep through a procedure. You may remain sleepy, be clumsy, or have poor balance for several hours following this procedure. Arrange for a responsible adult, family member, or friend to take you home. A responsible adult should stay with you for at least 24 hours or until the medicines have worn off.  Do not participate in any activities where you could become injured for the next 24 hours, or until you feel normal again. Do not:   Drive.   Swim.   Ride a bicycle.   Operate heavy machinery.   Cook.   Use power tools.   Climb ladders.   Work at heights.   Do not make important decisions or sign legal documents until you are improved.   Vomiting may occur if you eat too soon. When you can drink without vomiting, try water, juice, or soup. Try solid foods if you feel little or no nausea.   Only take over-the-counter or prescription medications for pain, discomfort, or fever as directed by your caregiver.If pain medications have been prescribed for you, ask your caregiver how soon it is safe to take them.   Make sure you and your family fully understands everything about the medication given to you. Make sure you understand what side effects may occur.   You should not drink alcohol, take sleeping pills, or medications that cause drowsiness for at least 24 hours.   If you smoke, do not smoke alone.   If you are feeling better, you may resume normal activities 24 hours after receiving sedation.   Keep all appointments as scheduled. Follow all instructions.   Ask questions if you do not understand.  SEEK MEDICAL CARE IF:   Your skin is pale or bluish in color.   You continue to feel sick to your stomach (nauseous) or throw up (vomit).   Your pain is getting worse and not helped by medication.   You have bleeding or swelling.   You are still sleepy or feeling clumsy after 24 hours.  SEEK IMMEDIATE  MEDICAL CARE IF:   You develop a rash.   You have difficulty breathing.   You develop any type of allergic problem.   You have a fever.  Document Released: 11/25/2000 Document Revised: 02/19/2011 Document Reviewed: 04/18/2007 Exit[employer] Patient Information 2012 [employer]. 

## 2011-08-26 NOTE — H&P (Signed)
Referring physician: [place]  Chief Complaint: Clotted L thigh graft  HPI: 66 y/o F with ESRD on HD who presents with clotted L thigh graft. Patient last received dialysis on Mon (6/10). Currently without complaint. No interval change in health status. Denies CP or SOB. Graft is non painful. No fever or chills.   Past Medical History   Diagnosis  Date   .  Hypertension    .  Hyperlipidemia    .  Diabetes mellitus    .  CVA (cerebral vascular accident)    .  Protein malnutrition    .  Secondary hyperparathyroidism    .  Anemia    .  End stage renal disease     Past Surgical History   Procedure  Date   .  Cataract extraction    .  Tubal ligation    .  Arteriovenous graft placement      BUE numerous times    Family History   Problem  Relation  Age of Onset   .  Cardiomyopathy     .  Heart disease  Mother    .  Diabetes  Mother    .  Diabetes  Other    .  Kidney disease  Other     Social History: reports that she quit smoking about 28 years ago. She does not have any smokeless tobacco history on file. She reports that she does not drink alcohol or use illicit drugs.   Allergies: No Known Allergies   Medications Prior to Admission   Medication  Sig  Dispense  Refill   .  amLODipine (NORVASC) 10 MG tablet  Take 10 mg by mouth daily.     .  calcium acetate (ELIPHOS) 667 MG tablet  Take 667 mg by mouth 3 (three) times daily with meals.     .  cinacalcet (SENSIPAR) 60 MG tablet  Take 60 mg by mouth daily.     .  enalapril (VASOTEC) 10 MG tablet  Take 10 mg by mouth daily.     .  insulin NPH-insulin regular (NOVOLIN 70/30) (70-30) 100 UNIT/ML injection  Inject 20-25 Units into the skin 2 (two) times daily with a meal. 25 units in am  20 units in pm     .  isosorbide mononitrate (IMDUR) 30 MG 24 hr tablet  Take 30 mg by mouth daily.     .  Multiple Vitamin (MULITIVITAMIN WITH MINERALS) TABS  Take 1 tablet by mouth daily.     .  pravastatin (PRAVACHOL) 40 MG tablet  Take 40 mg by  mouth daily.         Review of Systems  Constitutional: Negative.  Respiratory: Negative.  Cardiovascular: Negative.    Physical Exam  Constitutional: She appears well-developed and well-nourished.  Cardiovascular: Normal rate and regular rhythm.  Respiratory: Effort normal, CTA bilat. Ext: (L)thigh graft palpable, but no pulse, thrill, or bruit. Leg warm Psychiatric: She has a normal mood and affect.   Assessment/Plan  66 y/o F with h/o ESRD on HD with clotted L thigh graft who presents for a declot procedure. No change in patient's health status.  Informed written consent obtained after a discussion of the benefits, risks (including but not limited to bleeding/vessel injury, infection, technical failure requiring placement of HD catheter) and alternatives to treatment.  Ascencion Dike PA-C 08/26/2011 11:36 AM

## 2011-08-26 NOTE — Procedures (Signed)
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  Graft widely patent after declot

## 2011-08-26 NOTE — H&P (Signed)
Agree 

## 2011-09-01 ENCOUNTER — Ambulatory Visit (HOSPITAL_COMMUNITY): Payer: Medicare Other

## 2011-09-01 DIAGNOSIS — M5137 Other intervertebral disc degeneration, lumbosacral region: Secondary | ICD-10-CM | POA: Diagnosis not present

## 2011-09-11 DIAGNOSIS — M5137 Other intervertebral disc degeneration, lumbosacral region: Secondary | ICD-10-CM | POA: Diagnosis not present

## 2011-09-13 DIAGNOSIS — N186 End stage renal disease: Secondary | ICD-10-CM | POA: Diagnosis not present

## 2011-09-14 DIAGNOSIS — E1129 Type 2 diabetes mellitus with other diabetic kidney complication: Secondary | ICD-10-CM | POA: Diagnosis not present

## 2011-09-14 DIAGNOSIS — D509 Iron deficiency anemia, unspecified: Secondary | ICD-10-CM | POA: Diagnosis not present

## 2011-09-14 DIAGNOSIS — D631 Anemia in chronic kidney disease: Secondary | ICD-10-CM | POA: Diagnosis not present

## 2011-09-14 DIAGNOSIS — N2581 Secondary hyperparathyroidism of renal origin: Secondary | ICD-10-CM | POA: Diagnosis not present

## 2011-09-14 DIAGNOSIS — N186 End stage renal disease: Secondary | ICD-10-CM | POA: Diagnosis not present

## 2011-09-15 DIAGNOSIS — E1149 Type 2 diabetes mellitus with other diabetic neurological complication: Secondary | ICD-10-CM | POA: Diagnosis not present

## 2011-09-15 DIAGNOSIS — E1129 Type 2 diabetes mellitus with other diabetic kidney complication: Secondary | ICD-10-CM | POA: Diagnosis not present

## 2011-09-15 DIAGNOSIS — I10 Essential (primary) hypertension: Secondary | ICD-10-CM | POA: Diagnosis not present

## 2011-09-15 DIAGNOSIS — Z0000 Encounter for general adult medical examination without abnormal findings: Secondary | ICD-10-CM | POA: Diagnosis not present

## 2011-09-15 DIAGNOSIS — E1142 Type 2 diabetes mellitus with diabetic polyneuropathy: Secondary | ICD-10-CM | POA: Diagnosis not present

## 2011-09-15 DIAGNOSIS — E785 Hyperlipidemia, unspecified: Secondary | ICD-10-CM | POA: Diagnosis not present

## 2011-09-15 DIAGNOSIS — I6789 Other cerebrovascular disease: Secondary | ICD-10-CM | POA: Diagnosis not present

## 2011-09-15 DIAGNOSIS — E1169 Type 2 diabetes mellitus with other specified complication: Secondary | ICD-10-CM | POA: Diagnosis not present

## 2011-09-18 DIAGNOSIS — M25559 Pain in unspecified hip: Secondary | ICD-10-CM | POA: Diagnosis not present

## 2011-09-18 DIAGNOSIS — M51379 Other intervertebral disc degeneration, lumbosacral region without mention of lumbar back pain or lower extremity pain: Secondary | ICD-10-CM | POA: Diagnosis not present

## 2011-09-18 DIAGNOSIS — M5137 Other intervertebral disc degeneration, lumbosacral region: Secondary | ICD-10-CM | POA: Diagnosis not present

## 2011-09-22 DIAGNOSIS — M5137 Other intervertebral disc degeneration, lumbosacral region: Secondary | ICD-10-CM | POA: Diagnosis not present

## 2011-10-06 DIAGNOSIS — M5137 Other intervertebral disc degeneration, lumbosacral region: Secondary | ICD-10-CM | POA: Diagnosis not present

## 2011-10-07 DIAGNOSIS — E1129 Type 2 diabetes mellitus with other diabetic kidney complication: Secondary | ICD-10-CM | POA: Diagnosis not present

## 2011-10-14 DIAGNOSIS — N186 End stage renal disease: Secondary | ICD-10-CM | POA: Diagnosis not present

## 2011-10-15 DIAGNOSIS — M5137 Other intervertebral disc degeneration, lumbosacral region: Secondary | ICD-10-CM | POA: Diagnosis not present

## 2011-10-16 DIAGNOSIS — N2581 Secondary hyperparathyroidism of renal origin: Secondary | ICD-10-CM | POA: Diagnosis not present

## 2011-10-16 DIAGNOSIS — D631 Anemia in chronic kidney disease: Secondary | ICD-10-CM | POA: Diagnosis not present

## 2011-10-16 DIAGNOSIS — N186 End stage renal disease: Secondary | ICD-10-CM | POA: Diagnosis not present

## 2011-10-16 DIAGNOSIS — D509 Iron deficiency anemia, unspecified: Secondary | ICD-10-CM | POA: Diagnosis not present

## 2011-10-26 DIAGNOSIS — R209 Unspecified disturbances of skin sensation: Secondary | ICD-10-CM | POA: Diagnosis not present

## 2011-10-31 DIAGNOSIS — R209 Unspecified disturbances of skin sensation: Secondary | ICD-10-CM | POA: Diagnosis not present

## 2011-11-02 DIAGNOSIS — M4310 Spondylolisthesis, site unspecified: Secondary | ICD-10-CM | POA: Diagnosis not present

## 2011-11-10 DIAGNOSIS — M79609 Pain in unspecified limb: Secondary | ICD-10-CM | POA: Diagnosis not present

## 2011-11-10 DIAGNOSIS — B351 Tinea unguium: Secondary | ICD-10-CM | POA: Diagnosis not present

## 2011-11-14 DIAGNOSIS — N186 End stage renal disease: Secondary | ICD-10-CM | POA: Diagnosis not present

## 2011-11-16 DIAGNOSIS — N2581 Secondary hyperparathyroidism of renal origin: Secondary | ICD-10-CM | POA: Diagnosis not present

## 2011-11-16 DIAGNOSIS — D509 Iron deficiency anemia, unspecified: Secondary | ICD-10-CM | POA: Diagnosis not present

## 2011-11-16 DIAGNOSIS — D631 Anemia in chronic kidney disease: Secondary | ICD-10-CM | POA: Diagnosis not present

## 2011-11-16 DIAGNOSIS — N186 End stage renal disease: Secondary | ICD-10-CM | POA: Diagnosis not present

## 2011-11-17 DIAGNOSIS — M4310 Spondylolisthesis, site unspecified: Secondary | ICD-10-CM | POA: Diagnosis not present

## 2011-11-17 DIAGNOSIS — M5137 Other intervertebral disc degeneration, lumbosacral region: Secondary | ICD-10-CM | POA: Diagnosis not present

## 2011-11-26 DIAGNOSIS — G5620 Lesion of ulnar nerve, unspecified upper limb: Secondary | ICD-10-CM | POA: Diagnosis not present

## 2011-12-02 DIAGNOSIS — M4310 Spondylolisthesis, site unspecified: Secondary | ICD-10-CM | POA: Diagnosis not present

## 2011-12-02 DIAGNOSIS — M5137 Other intervertebral disc degeneration, lumbosacral region: Secondary | ICD-10-CM | POA: Diagnosis not present

## 2011-12-02 DIAGNOSIS — S5400XA Injury of ulnar nerve at forearm level, unspecified arm, initial encounter: Secondary | ICD-10-CM | POA: Diagnosis not present

## 2011-12-14 DIAGNOSIS — N186 End stage renal disease: Secondary | ICD-10-CM | POA: Diagnosis not present

## 2011-12-16 DIAGNOSIS — D509 Iron deficiency anemia, unspecified: Secondary | ICD-10-CM | POA: Diagnosis not present

## 2011-12-16 DIAGNOSIS — D631 Anemia in chronic kidney disease: Secondary | ICD-10-CM | POA: Diagnosis not present

## 2011-12-16 DIAGNOSIS — N2581 Secondary hyperparathyroidism of renal origin: Secondary | ICD-10-CM | POA: Diagnosis not present

## 2011-12-16 DIAGNOSIS — N186 End stage renal disease: Secondary | ICD-10-CM | POA: Diagnosis not present

## 2011-12-24 ENCOUNTER — Ambulatory Visit (INDEPENDENT_AMBULATORY_CARE_PROVIDER_SITE_OTHER): Payer: Medicare Other | Admitting: Physician Assistant

## 2011-12-24 ENCOUNTER — Encounter: Payer: Self-pay | Admitting: Physician Assistant

## 2011-12-24 VITALS — BP 118/62 | HR 71 | Ht 64.0 in | Wt 181.0 lb

## 2011-12-24 DIAGNOSIS — R011 Cardiac murmur, unspecified: Secondary | ICD-10-CM | POA: Diagnosis not present

## 2011-12-24 DIAGNOSIS — I10 Essential (primary) hypertension: Secondary | ICD-10-CM | POA: Diagnosis not present

## 2011-12-24 DIAGNOSIS — N186 End stage renal disease: Secondary | ICD-10-CM

## 2011-12-24 NOTE — Patient Instructions (Addendum)
Your physician has requested that you have an echocardiogram DX MURMUR. Echocardiography is a painless test that uses sound waves to create images of your heart. It provides your doctor with information about the size and shape of your heart and how well your heart's chambers and valves are working. This procedure takes approximately one hour. There are no restrictions for this procedure.  NO CHANGES WERE MADE TODAY  FOLLOW UP AS NEEDED

## 2011-12-24 NOTE — Progress Notes (Signed)
[name], [place]  [address] Phone: [phone] Fax:  [phone]  Date:  12/24/2011   Name:  Stefanie [name]   DOB:  1946-01-14   MRN:  [id number]  PCP:  Karsten Ro, MD  Primary Cardiologist/Primary Electrophysiologist:  Dr. Virl Axe    History of Present Illness: Stefanie [name] who returns for follow up.  She has a history of DM2, HTN, HL, prior stroke, ESRD on hemodialysis. She has a history of bradycardia identified a dialysis. She has seen Dr. Caryl Comes in the past. Event monitor demonstrated normal heart rate aside from one occasion where her heart rate went down to the 40s. No further intervention was required. She was asked to followup as needed. She has a history of normal LV function by echocardiogram and normal nuclear stress test in 2012.  She is referred back today by the [facility] for reasons that are not entirely clear to me.  Records sent indicate she is referred back "regarding NTG/CAD."  She denies chest pain, dyspnea, decreased exercise tolerance.  She is taking Imdur.  She is not sure who Rx this to her.  Patient denies recent hx of chest pain or treatment of chest pain.  No syncope.  No PND.  No edema.  No palpitations.    Wt Readings from Last 3 Encounters:  12/24/11 181 lb (82.101 kg)  11/04/10 185 lb (83.915 kg)  07/10/10 187 lb (84.823 kg)     Past Medical History  Diagnosis Date  . Hypertension   . Hyperlipidemia   . Diabetes mellitus   . CVA (cerebral vascular accident)   . Protein malnutrition   . Secondary hyperparathyroidism   . Anemia   . End stage renal disease   . Hx of echocardiogram     a. Echo 03/2010: EF 55-60%, Gr 1 diast dysfn, trivial MS, mild to mod LAE, PASP 34,   . Hx of cardiovascular stress test     a. MV 03/2010:  no ischemia, EF 55%    Current Outpatient Prescriptions  Medication Sig Dispense Refill  . calcium acetate (ELIPHOS) 667 MG tablet Take 667 mg by mouth  3 (three) times daily with meals.       . cinacalcet (SENSIPAR) 60 MG tablet Take 60 mg by mouth daily.        . insulin NPH-insulin regular (NOVOLIN 70/30) (70-30) 100 UNIT/ML injection Inject 20-25 Units into the skin 2 (two) times daily with a meal. 25 units in am 20 units in pm       . isosorbide mononitrate (IMDUR) 30 MG 24 hr tablet Take 30 mg by mouth daily.       . Multiple Vitamin (MULITIVITAMIN WITH MINERALS) TABS Take 1 tablet by mouth daily.        . pravastatin (PRAVACHOL) 40 MG tablet Take 40 mg by mouth daily.          Allergies: No Known Allergies  History  Substance Use Topics  . Smoking status: Former Smoker    Quit date: 03/17/1983  . Smokeless tobacco: Not on file  . Alcohol Use: No     ROS:  Please see the history of present illness.     All other systems reviewed and negative.   PHYSICAL EXAM: VS:  BP 118/62  Pulse 71  Ht 5\' 4"  (1.626 m)  Wt 181 lb (82.101 kg)  BMI 31.07 kg/m2 Well nourished, well developed, in no acute distress HEENT:  normal Neck: no JVD at 90 Cardiac:  normal S1, S2; RRR; 2/6 systolic murmur heard best at the LUSB Lungs:  clear to auscultation bilaterally, no wheezing, rhonchi or rales Abd: soft, nontender, no hepatomegaly Ext: no edema Skin: warm and dry Neuro:  CNs 2-12 intact, no focal abnormalities noted  EKG:  NSR, HR 71, low voltage, poor R wave progression, nonspecific ST-T wave changes, no significant change compared to prior tracing      ASSESSMENT AND PLAN:  1. Murmur:  She has a systolic murmur.  Last echo indicated only trivial MS.  Will repeat echo.  Follow up with Dr. Virl Axe as needed or sooner if echo is abnormal.    2.  Hypertension:  Controlled.  3. End Stage Renal Disease:  Managed by nephrology.  4. Dispo:  [facility].  No other issues were noted in her chart.  It is not clear who prescribed her Imdur.  She is currently stable from symptom standpoint.  No reason from cardiology  standpoint to change her medications.  She can continue her current therapy and follow up with Dr. Virl Axe as needed.    Signed, Richardson Dopp, PA-C  12:00 PM 12/24/2011

## 2011-12-30 DIAGNOSIS — E1129 Type 2 diabetes mellitus with other diabetic kidney complication: Secondary | ICD-10-CM | POA: Diagnosis not present

## 2011-12-31 DIAGNOSIS — N186 End stage renal disease: Secondary | ICD-10-CM | POA: Diagnosis not present

## 2011-12-31 DIAGNOSIS — E1149 Type 2 diabetes mellitus with other diabetic neurological complication: Secondary | ICD-10-CM | POA: Diagnosis not present

## 2011-12-31 DIAGNOSIS — Z23 Encounter for immunization: Secondary | ICD-10-CM | POA: Diagnosis not present

## 2011-12-31 DIAGNOSIS — I10 Essential (primary) hypertension: Secondary | ICD-10-CM | POA: Diagnosis not present

## 2011-12-31 DIAGNOSIS — E785 Hyperlipidemia, unspecified: Secondary | ICD-10-CM | POA: Diagnosis not present

## 2012-01-14 DIAGNOSIS — N186 End stage renal disease: Secondary | ICD-10-CM | POA: Diagnosis not present

## 2012-01-15 DIAGNOSIS — N2581 Secondary hyperparathyroidism of renal origin: Secondary | ICD-10-CM | POA: Diagnosis not present

## 2012-01-15 DIAGNOSIS — N186 End stage renal disease: Secondary | ICD-10-CM | POA: Diagnosis not present

## 2012-01-15 DIAGNOSIS — D631 Anemia in chronic kidney disease: Secondary | ICD-10-CM | POA: Diagnosis not present

## 2012-01-19 ENCOUNTER — Ambulatory Visit (HOSPITAL_COMMUNITY): Payer: Medicare Other | Attending: Cardiology | Admitting: Radiology

## 2012-01-19 DIAGNOSIS — R011 Cardiac murmur, unspecified: Secondary | ICD-10-CM | POA: Diagnosis not present

## 2012-01-19 DIAGNOSIS — E119 Type 2 diabetes mellitus without complications: Secondary | ICD-10-CM | POA: Diagnosis not present

## 2012-01-19 DIAGNOSIS — N186 End stage renal disease: Secondary | ICD-10-CM | POA: Insufficient documentation

## 2012-01-19 DIAGNOSIS — I369 Nonrheumatic tricuspid valve disorder, unspecified: Secondary | ICD-10-CM | POA: Insufficient documentation

## 2012-01-19 DIAGNOSIS — I120 Hypertensive chronic kidney disease with stage 5 chronic kidney disease or end stage renal disease: Secondary | ICD-10-CM | POA: Diagnosis not present

## 2012-01-19 NOTE — Progress Notes (Signed)
Echocardiogram performed.  

## 2012-01-20 ENCOUNTER — Encounter: Payer: Self-pay | Admitting: Physician Assistant

## 2012-01-20 ENCOUNTER — Telehealth: Payer: Self-pay | Admitting: *Deleted

## 2012-01-20 NOTE — Telephone Encounter (Signed)
Message copied by Michae Kava on Wed Jan 20, 2012  5:57 PM ------      Message from: [place], [place] T      Created: Wed Jan 20, 2012  5:25 PM       EF ok      Mitral stenosis still very mild and not significant at this time.      Continue with current treatment plan.      Richardson Dopp, PA-C  5:25 PM 01/20/2012

## 2012-01-20 NOTE — Telephone Encounter (Signed)
pt notified about echo results w/verbal understanding today 

## 2012-02-02 DIAGNOSIS — M79609 Pain in unspecified limb: Secondary | ICD-10-CM | POA: Diagnosis not present

## 2012-02-02 DIAGNOSIS — I872 Venous insufficiency (chronic) (peripheral): Secondary | ICD-10-CM | POA: Diagnosis not present

## 2012-02-02 DIAGNOSIS — B351 Tinea unguium: Secondary | ICD-10-CM | POA: Diagnosis not present

## 2012-02-02 DIAGNOSIS — E1149 Type 2 diabetes mellitus with other diabetic neurological complication: Secondary | ICD-10-CM | POA: Diagnosis not present

## 2012-02-08 ENCOUNTER — Encounter (HOSPITAL_COMMUNITY): Payer: Self-pay

## 2012-02-08 ENCOUNTER — Other Ambulatory Visit (HOSPITAL_COMMUNITY): Payer: Self-pay | Admitting: Medical

## 2012-02-08 ENCOUNTER — Ambulatory Visit (HOSPITAL_COMMUNITY)
Admission: RE | Admit: 2012-02-08 | Discharge: 2012-02-08 | Disposition: A | Discharge status: Home / Self Care | Payer: Medicare Other | Source: Ambulatory Visit | Attending: Medical | Admitting: Medical

## 2012-02-08 VITALS — BP 126/54 | HR 65 | Temp 97.9°F | Resp 20

## 2012-02-08 DIAGNOSIS — Z8673 Personal history of transient ischemic attack (TIA), and cerebral infarction without residual deficits: Secondary | ICD-10-CM | POA: Insufficient documentation

## 2012-02-08 DIAGNOSIS — N186 End stage renal disease: Secondary | ICD-10-CM | POA: Diagnosis not present

## 2012-02-08 DIAGNOSIS — T82898A Other specified complication of vascular prosthetic devices, implants and grafts, initial encounter: Secondary | ICD-10-CM | POA: Diagnosis not present

## 2012-02-08 DIAGNOSIS — I509 Heart failure, unspecified: Secondary | ICD-10-CM | POA: Diagnosis not present

## 2012-02-08 DIAGNOSIS — E785 Hyperlipidemia, unspecified: Secondary | ICD-10-CM | POA: Diagnosis not present

## 2012-02-08 DIAGNOSIS — I120 Hypertensive chronic kidney disease with stage 5 chronic kidney disease or end stage renal disease: Secondary | ICD-10-CM | POA: Diagnosis not present

## 2012-02-08 DIAGNOSIS — D649 Anemia, unspecified: Secondary | ICD-10-CM | POA: Diagnosis not present

## 2012-02-08 DIAGNOSIS — Y832 Surgical operation with anastomosis, bypass or graft as the cause of abnormal reaction of the patient, or of later complication, without mention of misadventure at the time of the procedure: Secondary | ICD-10-CM | POA: Insufficient documentation

## 2012-02-08 DIAGNOSIS — E119 Type 2 diabetes mellitus without complications: Secondary | ICD-10-CM | POA: Insufficient documentation

## 2012-02-08 HISTORY — DX: Serous retinal detachment, bilateral: H33.23

## 2012-02-08 HISTORY — DX: Heart failure, unspecified: I50.9

## 2012-02-08 MED ORDER — ALTEPLASE 100 MG IV SOLR
4.0000 mg | Freq: Once | INTRAVENOUS | Status: AC
  Administered 2012-02-08: 4 mg
  Filled 2012-02-08: qty 4

## 2012-02-08 MED ORDER — MIDAZOLAM HCL 2 MG/2ML IJ SOLN
INTRAMUSCULAR | Status: AC
  Filled 2012-02-08: qty 4

## 2012-02-08 MED ORDER — FENTANYL CITRATE 0.05 MG/ML IJ SOLN
INTRAMUSCULAR | Status: AC | PRN
  Administered 2012-02-08: 25 ug via INTRAVENOUS
  Administered 2012-02-08: 50 ug via INTRAVENOUS
  Administered 2012-02-08: 25 ug via INTRAVENOUS

## 2012-02-08 MED ORDER — MIDAZOLAM HCL 2 MG/2ML IJ SOLN
INTRAMUSCULAR | Status: AC | PRN
  Administered 2012-02-08: 0.5 mg via INTRAVENOUS
  Administered 2012-02-08: 1 mg via INTRAVENOUS
  Administered 2012-02-08: 0.5 mg via INTRAVENOUS

## 2012-02-08 MED ORDER — HEPARIN SODIUM (PORCINE) 1000 UNIT/ML IJ SOLN
INTRAMUSCULAR | Status: AC
  Filled 2012-02-08: qty 1

## 2012-02-08 MED ORDER — FENTANYL CITRATE 0.05 MG/ML IJ SOLN
INTRAMUSCULAR | Status: AC
  Filled 2012-02-08: qty 4

## 2012-02-08 MED ORDER — HEPARIN SODIUM (PORCINE) 1000 UNIT/ML IJ SOLN
INTRAMUSCULAR | Status: AC | PRN
  Administered 2012-02-08: 3000 [IU] via INTRAVENOUS

## 2012-02-08 NOTE — H&P (Signed)
Stefanie [name] is an 66 y.o. female.   Chief Complaint: Am here today for declot of my graft. HPI: patient with left femoral AVG created 11/13/10; has had two prior declot procedures with IR : 06/06/11 and 08/26/11. Patient had angioplasty performed at that time of the recurrent venous anastomosis. Patient denies the use of clamps on her femoral graft.    Past Medical History  Diagnosis Date  . Hypertension   . Hyperlipidemia   . Diabetes mellitus   . CVA (cerebral vascular accident) [place]    left sided weakness  . Protein malnutrition   . Secondary hyperparathyroidism   . Anemia   . End stage renal disease     initiated HD 2006  . Hx of echocardiogram     a. Echo 03/2010: EF 55-60%, Gr 1 diast dysfn, trivial MS, mild to mod LAE, PASP 34, ;  b. Echo 11/13: mild LVH, EF 60%, Gr 1 diast dysfn, Ao sclerosis, no AS, MAC, slight MS, mean 3 mmHg, PASP 34  . Hx of cardiovascular stress test     a. MV 03/2010:  no ischemia, EF 55%  . CHF (congestive heart failure)   . Retinal detachment, bilateral     Past Surgical History  Procedure Date  . Cataract extraction   . Tubal ligation   . Arteriovenous graft placement     BUE numerous times  . Arteriovenous graft placement 11/13/10    Lt femoral - Dr. Kellie Simmering  . Dg av dialysis graft declot or 06/06/11, 08/26/11    performed in IR   Patient denies any complications with moderate sedation or anesthesia in the past.   Family History  Problem Relation Age of Onset  . Cardiomyopathy    . Heart disease Mother   . Diabetes Mother   . Diabetes Other   . Kidney disease Other    Social History:  reports that she quit smoking about 28 years ago. She does not have any smokeless tobacco history on file. She reports that she does not drink alcohol or use illicit drugs.  Allergies: No Known Allergies    Medication List     As of 02/08/2012 11:42 AM    ASK your doctor about these medications         cinacalcet 60 MG tablet   Commonly known  as: SENSIPAR   Take 60 mg by mouth daily.      ELIPHOS 667 MG tablet   Generic drug: calcium acetate   Take 667 mg by mouth 3 (three) times daily with meals.      insulin NPH-insulin regular (70-30) 100 UNIT/ML injection   Commonly known as: NOVOLIN 70/30   Inject 20-25 Units into the skin 2 (two) times daily with a meal. 25 units in am  20 units in pm      isosorbide mononitrate 30 MG 24 hr tablet   Commonly known as: IMDUR   Take 30 mg by mouth daily.      multivitamin with minerals Tabs   Take 1 tablet by mouth daily.      pravastatin 40 MG tablet   Commonly known as: PRAVACHOL   Take 40 mg by mouth daily.          Review of Systems  Constitutional: Negative for fever and chills.  Eyes: Positive for blurred vision. Negative for pain.  Respiratory: Negative for cough, sputum production and shortness of breath.   Cardiovascular: Negative for chest pain, palpitations and leg swelling.  Gastrointestinal:  Negative for heartburn, nausea, vomiting and abdominal pain.  Genitourinary:       ESRD   Skin: Negative.   Neurological: Positive for sensory change, speech change, focal weakness and weakness. Negative for dizziness, tingling, tremors, seizures and loss of consciousness.       Hx of CVA x 2 with residual left sided weakness   Psychiatric/Behavioral: Negative for depression, memory loss and substance abuse. The patient is not nervous/anxious.     Blood pressure 124/56, pulse 58, temperature 97.9 F (36.6 C), temperature source Oral, resp. rate 16, SpO2 100.00%. Physical Exam  Constitutional: She is oriented to person, place, and time. She appears well-developed. No distress.  HENT:  Head: Normocephalic and atraumatic.       Mild dysphasia  Cardiovascular: Normal rate and regular rhythm.  Exam reveals no gallop.   No murmur heard. Respiratory: Effort normal and breath sounds normal. No respiratory distress. She has no wheezes. She has no rales.  GI: Soft. Bowel  sounds are normal.  Musculoskeletal:       Left sided weakness with mild clumsiness of her right hand. Not new per patient. Left femoral AVG without palpable thrill. No LE edema seen on examination   Neurological: She is alert and oriented to person, place, and time.  Skin: Skin is warm and dry.  Psychiatric: She has a normal mood and affect. Her behavior is normal. Judgment and thought content normal.     Assessment/Plan Declot procedure reviewed with the patient with her apparent understanding. Procedure details and potential risks including but not limited to infection, bleeding, vessel damage, complications with moderate sedation and unsuccessful declot necessitating HD catheter placement discussed with the patient's apparent understanding. Written consent obtained.     CAMPBELL,PAMELA D 02/08/2012, 11:36 AM

## 2012-02-08 NOTE — ED Notes (Signed)
2mg  of tpa given; remaining dose given at 1334

## 2012-02-08 NOTE — ED Notes (Signed)
Placed on 2L o2

## 2012-02-08 NOTE — H&P (Signed)
Agree with PA note.  Suspect recurrent stenosis at the venous anastomosis.  Will proceed with declot and venous PTA if indicated.  Signed,  Criselda Peaches, MD Vascular & Interventional Radiologist [facility] Radiology

## 2012-02-08 NOTE — Procedures (Signed)
Interventional Radiology Procedure Note  Procedure:  1.) Declot of left thigh loop AVG 2.) Venous PTA of recurrent anastomotic stenosis to 7 mm Complications: None Recommendations: - May resume dialysis  Signed,  Criselda Peaches, MD Vascular & Interventional Radiologist [facility] Radiology

## 2012-02-09 ENCOUNTER — Other Ambulatory Visit: Payer: Self-pay | Admitting: Cardiology

## 2012-02-09 DIAGNOSIS — I739 Peripheral vascular disease, unspecified: Secondary | ICD-10-CM

## 2012-02-09 DIAGNOSIS — I70219 Atherosclerosis of native arteries of extremities with intermittent claudication, unspecified extremity: Secondary | ICD-10-CM

## 2012-02-13 DIAGNOSIS — N186 End stage renal disease: Secondary | ICD-10-CM | POA: Diagnosis not present

## 2012-02-15 DIAGNOSIS — N2581 Secondary hyperparathyroidism of renal origin: Secondary | ICD-10-CM | POA: Diagnosis not present

## 2012-02-15 DIAGNOSIS — D509 Iron deficiency anemia, unspecified: Secondary | ICD-10-CM | POA: Diagnosis not present

## 2012-02-15 DIAGNOSIS — D631 Anemia in chronic kidney disease: Secondary | ICD-10-CM | POA: Diagnosis not present

## 2012-02-15 DIAGNOSIS — N186 End stage renal disease: Secondary | ICD-10-CM | POA: Diagnosis not present

## 2012-02-23 ENCOUNTER — Encounter (INDEPENDENT_AMBULATORY_CARE_PROVIDER_SITE_OTHER): Payer: Medicare Other

## 2012-02-23 ENCOUNTER — Other Ambulatory Visit: Payer: Self-pay | Admitting: Cardiology

## 2012-02-23 DIAGNOSIS — E1159 Type 2 diabetes mellitus with other circulatory complications: Secondary | ICD-10-CM

## 2012-02-23 DIAGNOSIS — I739 Peripheral vascular disease, unspecified: Secondary | ICD-10-CM

## 2012-02-24 ENCOUNTER — Ambulatory Visit (INDEPENDENT_AMBULATORY_CARE_PROVIDER_SITE_OTHER): Payer: Medicare Other | Admitting: Cardiovascular Disease

## 2012-02-24 ENCOUNTER — Encounter: Payer: Self-pay | Admitting: Cardiovascular Disease

## 2012-02-24 VITALS — BP 131/66 | HR 64 | Ht 65.0 in | Wt 188.8 lb

## 2012-02-24 DIAGNOSIS — I739 Peripheral vascular disease, unspecified: Secondary | ICD-10-CM | POA: Insufficient documentation

## 2012-02-24 NOTE — Patient Instructions (Addendum)
Your physician recommends that you schedule a follow-up appointment in: as needed  

## 2012-02-24 NOTE — Assessment & Plan Note (Signed)
The patient has evidence of mild proliferative disease. ABI was 0.97 on the right side and 0.82 on the left side. Bilateral tibial disease suspected based on waveforms without evidence of significant inflow disease or infrainguinal disease to the knee. She has no claudication, rest pain or ulceration. Thus, I do not recommend further invasive evaluation at this time. Continued treatment of risk factors. Followup as needed.

## 2012-02-24 NOTE — Progress Notes (Signed)
HPI  This is a 66 year old African American female who is here today for evaluation of possible peripheral arterial disease after an abnormal lower extremity arterial duplex ultrasound. She has a history of DM2, HTN, HL, prior stroke, ESRD on hemodialysis. She has a history of bradycardia identified during dialysis. She has seen Dr. Caryl Comes in the past. Event monitor demonstrated normal heart rate aside from one occasion where her heart rate went down to the 40s. No further intervention was required. She has a history of normal LV function by echocardiogram and normal nuclear stress test in 2012.  She went to see Dr. Valentina Lucks recently to clip her toe nails. She was noted to have diminished pulses and thus was referred for ABI and lower extremity duplex. The patient had multiple previous vascular surgeries for dialysis access with inactive access point in both arms. Currently has active AV graft in the left groin. Here mobility is very limited to previous stroke. She denies any claudication or rest pain. She has no lower extremity ulceration.   No Known Allergies   Current Outpatient Prescriptions on File Prior to Visit  Medication Sig Dispense Refill  . calcium acetate (ELIPHOS) 667 MG tablet Take 667 mg by mouth 3 (three) times daily with meals.       . cinacalcet (SENSIPAR) 60 MG tablet Take 60 mg by mouth daily.        . insulin NPH-insulin regular (NOVOLIN 70/30) (70-30) 100 UNIT/ML injection Inject 20-25 Units into the skin 2 (two) times daily with a meal. 25 units in am 20 units in pm       . Multiple Vitamin (MULITIVITAMIN WITH MINERALS) TABS Take 1 tablet by mouth daily.        . pravastatin (PRAVACHOL) 40 MG tablet Take 40 mg by mouth daily.        . isosorbide mononitrate (IMDUR) 30 MG 24 hr tablet Take 30 mg by mouth daily.          Past Medical History  Diagnosis Date  . Hypertension   . Hyperlipidemia   . Diabetes mellitus   . CVA (cerebral vascular accident) [place]   left sided weakness  . Protein malnutrition   . Secondary hyperparathyroidism   . Anemia   . End stage renal disease     initiated HD 2006  . Hx of echocardiogram     a. Echo 03/2010: EF 55-60%, Gr 1 diast dysfn, trivial MS, mild to mod LAE, PASP 34, ;  b. Echo 11/13: mild LVH, EF 60%, Gr 1 diast dysfn, Ao sclerosis, no AS, MAC, slight MS, mean 3 mmHg, PASP 34  . Hx of cardiovascular stress test     a. MV 03/2010:  no ischemia, EF 55%  . CHF (congestive heart failure)   . Retinal detachment, bilateral      Past Surgical History  Procedure Date  . Cataract extraction   . Tubal ligation   . Arteriovenous graft placement     BUE numerous times  . Arteriovenous graft placement 11/13/10    Lt femoral - Dr. Kellie Simmering  . Dg av dialysis graft declot or 06/06/11, 08/26/11    performed in IR     Family History  Problem Relation Age of Onset  . Cardiomyopathy    . Heart disease Mother   . Diabetes Mother   . Diabetes Other   . Kidney disease Other      History   Social History  . Marital Status: Single  Spouse Name: N/A    Number of Children: 5  . Years of Education: N/A   Occupational History  .     Social History Main Topics  . Smoking status: Former Smoker    Quit date: 03/17/1983  . Smokeless tobacco: Not on file  . Alcohol Use: No  . Drug Use: No  . Sexually Active: Not on file   Other Topics Concern  . Not on file   Social History Narrative  . No narrative on file     PHYSICAL EXAM   BP 131/66  Pulse 64  Ht 5\' 5"  (1.651 m)  Wt 188 lb 12.8 oz (85.639 kg)  BMI 31.42 kg/m2  SpO2 99% Constitutional: She is oriented to person, place, and time. She appears well-developed and well-nourished. No distress.  HENT: No nasal discharge.  Head: Normocephalic and atraumatic.  Eyes: Pupils are equal and round. Right eye exhibits no discharge. Left eye exhibits no discharge.  Neck: Normal range of motion. Neck supple. No JVD present. No thyromegaly present.    Cardiovascular: Normal rate, regular rhythm, normal heart sounds. Exam reveals no gallop and no friction rub. No murmur heard.  Pulmonary/Chest: Effort normal and breath sounds normal. No stridor. No respiratory distress. She has no wheezes. She has no rales. She exhibits no tenderness.  Abdominal: Soft. Bowel sounds are normal. She exhibits no distension. There is no tenderness. There is no rebound and no guarding.  Musculoskeletal: Normal range of motion. She exhibits no edema and no tenderness.  Neurological: She is alert and oriented to person, place, and time. Coordination normal.  Skin: Skin is warm and dry. No rash noted. She is not diaphoretic. No erythema. No pallor.  Psychiatric: She has a normal mood and affect. Her behavior is normal. Judgment and thought content normal.  Vascular: Radial pulses are not palpable. Femoral pulse is slightly diminished on the right side. There is a surgical scar on the left side with scar tissue from AV graft. Distal pulses are not palpable. No ulcers.    ASSESSMENT AND PLAN

## 2012-02-29 DIAGNOSIS — E1169 Type 2 diabetes mellitus with other specified complication: Secondary | ICD-10-CM | POA: Diagnosis not present

## 2012-03-15 DIAGNOSIS — N186 End stage renal disease: Secondary | ICD-10-CM | POA: Diagnosis not present

## 2012-03-18 DIAGNOSIS — N186 End stage renal disease: Secondary | ICD-10-CM | POA: Diagnosis not present

## 2012-03-18 DIAGNOSIS — N2581 Secondary hyperparathyroidism of renal origin: Secondary | ICD-10-CM | POA: Diagnosis not present

## 2012-03-18 DIAGNOSIS — D631 Anemia in chronic kidney disease: Secondary | ICD-10-CM | POA: Diagnosis not present

## 2012-04-05 DIAGNOSIS — E785 Hyperlipidemia, unspecified: Secondary | ICD-10-CM | POA: Diagnosis not present

## 2012-04-05 DIAGNOSIS — I10 Essential (primary) hypertension: Secondary | ICD-10-CM | POA: Diagnosis not present

## 2012-04-05 DIAGNOSIS — E1169 Type 2 diabetes mellitus with other specified complication: Secondary | ICD-10-CM | POA: Diagnosis not present

## 2012-04-05 DIAGNOSIS — N186 End stage renal disease: Secondary | ICD-10-CM | POA: Diagnosis not present

## 2012-04-06 DIAGNOSIS — N186 End stage renal disease: Secondary | ICD-10-CM | POA: Diagnosis not present

## 2012-04-06 DIAGNOSIS — E1129 Type 2 diabetes mellitus with other diabetic kidney complication: Secondary | ICD-10-CM | POA: Diagnosis not present

## 2012-04-15 DIAGNOSIS — N186 End stage renal disease: Secondary | ICD-10-CM | POA: Diagnosis not present

## 2012-04-18 DIAGNOSIS — N2581 Secondary hyperparathyroidism of renal origin: Secondary | ICD-10-CM | POA: Diagnosis not present

## 2012-04-18 DIAGNOSIS — N186 End stage renal disease: Secondary | ICD-10-CM | POA: Diagnosis not present

## 2012-04-18 DIAGNOSIS — D631 Anemia in chronic kidney disease: Secondary | ICD-10-CM | POA: Diagnosis not present

## 2012-04-29 DIAGNOSIS — H33049 Retinal detachment with retinal dialysis, unspecified eye: Secondary | ICD-10-CM | POA: Diagnosis not present

## 2012-04-29 DIAGNOSIS — N189 Chronic kidney disease, unspecified: Secondary | ICD-10-CM | POA: Diagnosis not present

## 2012-05-13 DIAGNOSIS — R05 Cough: Secondary | ICD-10-CM | POA: Diagnosis not present

## 2012-05-13 DIAGNOSIS — N186 End stage renal disease: Secondary | ICD-10-CM | POA: Diagnosis not present

## 2012-05-13 DIAGNOSIS — E1129 Type 2 diabetes mellitus with other diabetic kidney complication: Secondary | ICD-10-CM | POA: Diagnosis not present

## 2012-05-13 DIAGNOSIS — J209 Acute bronchitis, unspecified: Secondary | ICD-10-CM | POA: Diagnosis not present

## 2012-05-13 DIAGNOSIS — N058 Unspecified nephritic syndrome with other morphologic changes: Secondary | ICD-10-CM | POA: Diagnosis not present

## 2012-05-16 DIAGNOSIS — N186 End stage renal disease: Secondary | ICD-10-CM | POA: Diagnosis not present

## 2012-05-16 DIAGNOSIS — D631 Anemia in chronic kidney disease: Secondary | ICD-10-CM | POA: Diagnosis not present

## 2012-05-16 DIAGNOSIS — N2581 Secondary hyperparathyroidism of renal origin: Secondary | ICD-10-CM | POA: Diagnosis not present

## 2012-05-19 DIAGNOSIS — E1149 Type 2 diabetes mellitus with other diabetic neurological complication: Secondary | ICD-10-CM | POA: Diagnosis not present

## 2012-05-19 DIAGNOSIS — L608 Other nail disorders: Secondary | ICD-10-CM | POA: Diagnosis not present

## 2012-05-19 DIAGNOSIS — Q828 Other specified congenital malformations of skin: Secondary | ICD-10-CM | POA: Diagnosis not present

## 2012-05-24 DIAGNOSIS — E11359 Type 2 diabetes mellitus with proliferative diabetic retinopathy without macular edema: Secondary | ICD-10-CM | POA: Diagnosis not present

## 2012-05-24 DIAGNOSIS — H43819 Vitreous degeneration, unspecified eye: Secondary | ICD-10-CM | POA: Diagnosis not present

## 2012-05-24 DIAGNOSIS — E1139 Type 2 diabetes mellitus with other diabetic ophthalmic complication: Secondary | ICD-10-CM | POA: Diagnosis not present

## 2012-06-13 DIAGNOSIS — N186 End stage renal disease: Secondary | ICD-10-CM | POA: Diagnosis not present

## 2012-06-14 DIAGNOSIS — M5137 Other intervertebral disc degeneration, lumbosacral region: Secondary | ICD-10-CM | POA: Diagnosis not present

## 2012-06-15 DIAGNOSIS — D631 Anemia in chronic kidney disease: Secondary | ICD-10-CM | POA: Diagnosis not present

## 2012-06-15 DIAGNOSIS — N186 End stage renal disease: Secondary | ICD-10-CM | POA: Diagnosis not present

## 2012-06-15 DIAGNOSIS — N2581 Secondary hyperparathyroidism of renal origin: Secondary | ICD-10-CM | POA: Diagnosis not present

## 2012-06-16 ENCOUNTER — Other Ambulatory Visit (HOSPITAL_COMMUNITY): Payer: Self-pay | Admitting: Nephrology

## 2012-06-16 DIAGNOSIS — N186 End stage renal disease: Secondary | ICD-10-CM

## 2012-06-21 ENCOUNTER — Other Ambulatory Visit (HOSPITAL_COMMUNITY): Payer: Self-pay | Admitting: Nephrology

## 2012-06-21 ENCOUNTER — Ambulatory Visit (HOSPITAL_COMMUNITY)
Admission: RE | Admit: 2012-06-21 | Discharge: 2012-06-21 | Disposition: A | Discharge status: Home / Self Care | Payer: Medicare Other | Source: Ambulatory Visit | Attending: Nephrology | Admitting: Nephrology

## 2012-06-21 DIAGNOSIS — Z992 Dependence on renal dialysis: Secondary | ICD-10-CM | POA: Insufficient documentation

## 2012-06-21 DIAGNOSIS — N186 End stage renal disease: Secondary | ICD-10-CM

## 2012-06-21 DIAGNOSIS — R609 Edema, unspecified: Secondary | ICD-10-CM | POA: Diagnosis not present

## 2012-06-21 DIAGNOSIS — T82898A Other specified complication of vascular prosthetic devices, implants and grafts, initial encounter: Secondary | ICD-10-CM | POA: Diagnosis not present

## 2012-06-21 DIAGNOSIS — Y832 Surgical operation with anastomosis, bypass or graft as the cause of abnormal reaction of the patient, or of later complication, without mention of misadventure at the time of the procedure: Secondary | ICD-10-CM | POA: Insufficient documentation

## 2012-06-21 DIAGNOSIS — I871 Compression of vein: Secondary | ICD-10-CM | POA: Insufficient documentation

## 2012-06-21 MED ORDER — IOHEXOL 300 MG/ML  SOLN
100.0000 mL | Freq: Once | INTRAMUSCULAR | Status: AC | PRN
  Administered 2012-06-21: 75 mL via INTRAVENOUS

## 2012-06-21 NOTE — Procedures (Signed)
Procedure:  Left thigh shuntogram with venous angioplasty Findings:  Moderate distal graft stenosis extending to venous anastamosis and significant outflow stenosis of left external iliac vein.  Treated with 7 mm and 10 mm angioplasty, respectively.  Good angiographic result.

## 2012-07-05 DIAGNOSIS — I10 Essential (primary) hypertension: Secondary | ICD-10-CM | POA: Diagnosis not present

## 2012-07-05 DIAGNOSIS — E785 Hyperlipidemia, unspecified: Secondary | ICD-10-CM | POA: Diagnosis not present

## 2012-07-05 DIAGNOSIS — E1169 Type 2 diabetes mellitus with other specified complication: Secondary | ICD-10-CM | POA: Diagnosis not present

## 2012-07-05 DIAGNOSIS — E1142 Type 2 diabetes mellitus with diabetic polyneuropathy: Secondary | ICD-10-CM | POA: Diagnosis not present

## 2012-07-05 DIAGNOSIS — N058 Unspecified nephritic syndrome with other morphologic changes: Secondary | ICD-10-CM | POA: Diagnosis not present

## 2012-07-05 DIAGNOSIS — N186 End stage renal disease: Secondary | ICD-10-CM | POA: Diagnosis not present

## 2012-07-05 DIAGNOSIS — E1149 Type 2 diabetes mellitus with other diabetic neurological complication: Secondary | ICD-10-CM | POA: Diagnosis not present

## 2012-07-05 DIAGNOSIS — E1129 Type 2 diabetes mellitus with other diabetic kidney complication: Secondary | ICD-10-CM | POA: Diagnosis not present

## 2012-07-06 DIAGNOSIS — E1129 Type 2 diabetes mellitus with other diabetic kidney complication: Secondary | ICD-10-CM | POA: Diagnosis not present

## 2012-07-13 DIAGNOSIS — N186 End stage renal disease: Secondary | ICD-10-CM | POA: Diagnosis not present

## 2012-07-15 DIAGNOSIS — N186 End stage renal disease: Secondary | ICD-10-CM | POA: Diagnosis not present

## 2012-07-15 DIAGNOSIS — D631 Anemia in chronic kidney disease: Secondary | ICD-10-CM | POA: Diagnosis not present

## 2012-07-15 DIAGNOSIS — N2581 Secondary hyperparathyroidism of renal origin: Secondary | ICD-10-CM | POA: Diagnosis not present

## 2012-07-19 DIAGNOSIS — M5137 Other intervertebral disc degeneration, lumbosacral region: Secondary | ICD-10-CM | POA: Diagnosis not present

## 2012-08-13 DIAGNOSIS — N186 End stage renal disease: Secondary | ICD-10-CM | POA: Diagnosis not present

## 2012-08-15 DIAGNOSIS — N2581 Secondary hyperparathyroidism of renal origin: Secondary | ICD-10-CM | POA: Diagnosis not present

## 2012-08-15 DIAGNOSIS — D631 Anemia in chronic kidney disease: Secondary | ICD-10-CM | POA: Diagnosis not present

## 2012-08-15 DIAGNOSIS — N186 End stage renal disease: Secondary | ICD-10-CM | POA: Diagnosis not present

## 2012-08-16 DIAGNOSIS — I871 Compression of vein: Secondary | ICD-10-CM | POA: Diagnosis not present

## 2012-08-16 DIAGNOSIS — T82898A Other specified complication of vascular prosthetic devices, implants and grafts, initial encounter: Secondary | ICD-10-CM | POA: Diagnosis not present

## 2012-08-16 DIAGNOSIS — N186 End stage renal disease: Secondary | ICD-10-CM | POA: Diagnosis not present

## 2012-08-16 DIAGNOSIS — M7989 Other specified soft tissue disorders: Secondary | ICD-10-CM | POA: Diagnosis not present

## 2012-08-25 DIAGNOSIS — Q828 Other specified congenital malformations of skin: Secondary | ICD-10-CM | POA: Diagnosis not present

## 2012-08-25 DIAGNOSIS — L608 Other nail disorders: Secondary | ICD-10-CM | POA: Diagnosis not present

## 2012-08-25 DIAGNOSIS — E1149 Type 2 diabetes mellitus with other diabetic neurological complication: Secondary | ICD-10-CM | POA: Diagnosis not present

## 2012-08-26 ENCOUNTER — Ambulatory Visit
Admission: RE | Admit: 2012-08-26 | Discharge: 2012-08-26 | Disposition: A | Discharge status: Home / Self Care | Payer: Medicare Other | Source: Ambulatory Visit | Attending: Nephrology | Admitting: Nephrology

## 2012-08-26 ENCOUNTER — Other Ambulatory Visit: Payer: Self-pay | Admitting: Nephrology

## 2012-08-26 DIAGNOSIS — R05 Cough: Secondary | ICD-10-CM

## 2012-08-26 DIAGNOSIS — R0602 Shortness of breath: Secondary | ICD-10-CM | POA: Diagnosis not present

## 2012-09-12 DIAGNOSIS — N186 End stage renal disease: Secondary | ICD-10-CM | POA: Diagnosis not present

## 2012-09-14 DIAGNOSIS — N186 End stage renal disease: Secondary | ICD-10-CM | POA: Diagnosis not present

## 2012-09-14 DIAGNOSIS — D631 Anemia in chronic kidney disease: Secondary | ICD-10-CM | POA: Diagnosis not present

## 2012-09-14 DIAGNOSIS — N2581 Secondary hyperparathyroidism of renal origin: Secondary | ICD-10-CM | POA: Diagnosis not present

## 2012-10-04 DIAGNOSIS — E1142 Type 2 diabetes mellitus with diabetic polyneuropathy: Secondary | ICD-10-CM | POA: Diagnosis not present

## 2012-10-04 DIAGNOSIS — E785 Hyperlipidemia, unspecified: Secondary | ICD-10-CM | POA: Diagnosis not present

## 2012-10-04 DIAGNOSIS — E1169 Type 2 diabetes mellitus with other specified complication: Secondary | ICD-10-CM | POA: Diagnosis not present

## 2012-10-04 DIAGNOSIS — E1149 Type 2 diabetes mellitus with other diabetic neurological complication: Secondary | ICD-10-CM | POA: Diagnosis not present

## 2012-10-04 DIAGNOSIS — D649 Anemia, unspecified: Secondary | ICD-10-CM | POA: Diagnosis not present

## 2012-10-04 DIAGNOSIS — N058 Unspecified nephritic syndrome with other morphologic changes: Secondary | ICD-10-CM | POA: Diagnosis not present

## 2012-10-04 DIAGNOSIS — E1129 Type 2 diabetes mellitus with other diabetic kidney complication: Secondary | ICD-10-CM | POA: Diagnosis not present

## 2012-10-04 DIAGNOSIS — M25569 Pain in unspecified knee: Secondary | ICD-10-CM | POA: Diagnosis not present

## 2012-10-05 DIAGNOSIS — E1129 Type 2 diabetes mellitus with other diabetic kidney complication: Secondary | ICD-10-CM | POA: Diagnosis not present

## 2012-10-06 DIAGNOSIS — N186 End stage renal disease: Secondary | ICD-10-CM | POA: Diagnosis not present

## 2012-10-06 DIAGNOSIS — T82898A Other specified complication of vascular prosthetic devices, implants and grafts, initial encounter: Secondary | ICD-10-CM | POA: Diagnosis not present

## 2012-10-06 DIAGNOSIS — I871 Compression of vein: Secondary | ICD-10-CM | POA: Diagnosis not present

## 2012-10-13 DIAGNOSIS — N186 End stage renal disease: Secondary | ICD-10-CM | POA: Diagnosis not present

## 2012-10-14 DIAGNOSIS — N186 End stage renal disease: Secondary | ICD-10-CM | POA: Diagnosis not present

## 2012-10-14 DIAGNOSIS — D631 Anemia in chronic kidney disease: Secondary | ICD-10-CM | POA: Diagnosis not present

## 2012-10-14 DIAGNOSIS — N2581 Secondary hyperparathyroidism of renal origin: Secondary | ICD-10-CM | POA: Diagnosis not present

## 2012-10-27 DIAGNOSIS — IMO0002 Reserved for concepts with insufficient information to code with codable children: Secondary | ICD-10-CM | POA: Diagnosis not present

## 2012-11-13 DIAGNOSIS — N186 End stage renal disease: Secondary | ICD-10-CM | POA: Diagnosis not present

## 2012-11-14 DIAGNOSIS — D631 Anemia in chronic kidney disease: Secondary | ICD-10-CM | POA: Diagnosis not present

## 2012-11-14 DIAGNOSIS — N186 End stage renal disease: Secondary | ICD-10-CM | POA: Diagnosis not present

## 2012-11-14 DIAGNOSIS — N2581 Secondary hyperparathyroidism of renal origin: Secondary | ICD-10-CM | POA: Diagnosis not present

## 2012-11-14 DIAGNOSIS — E1129 Type 2 diabetes mellitus with other diabetic kidney complication: Secondary | ICD-10-CM | POA: Diagnosis not present

## 2012-11-14 DIAGNOSIS — D509 Iron deficiency anemia, unspecified: Secondary | ICD-10-CM | POA: Diagnosis not present

## 2012-11-16 DIAGNOSIS — B351 Tinea unguium: Secondary | ICD-10-CM | POA: Diagnosis not present

## 2012-11-16 DIAGNOSIS — N186 End stage renal disease: Secondary | ICD-10-CM | POA: Diagnosis not present

## 2012-11-16 DIAGNOSIS — D509 Iron deficiency anemia, unspecified: Secondary | ICD-10-CM | POA: Diagnosis not present

## 2012-11-16 DIAGNOSIS — D631 Anemia in chronic kidney disease: Secondary | ICD-10-CM | POA: Diagnosis not present

## 2012-11-16 DIAGNOSIS — M79609 Pain in unspecified limb: Secondary | ICD-10-CM | POA: Diagnosis not present

## 2012-11-16 DIAGNOSIS — E119 Type 2 diabetes mellitus without complications: Secondary | ICD-10-CM | POA: Diagnosis not present

## 2012-11-16 DIAGNOSIS — N2581 Secondary hyperparathyroidism of renal origin: Secondary | ICD-10-CM | POA: Diagnosis not present

## 2012-11-16 DIAGNOSIS — E1129 Type 2 diabetes mellitus with other diabetic kidney complication: Secondary | ICD-10-CM | POA: Diagnosis not present

## 2012-11-18 DIAGNOSIS — E1129 Type 2 diabetes mellitus with other diabetic kidney complication: Secondary | ICD-10-CM | POA: Diagnosis not present

## 2012-11-18 DIAGNOSIS — N2581 Secondary hyperparathyroidism of renal origin: Secondary | ICD-10-CM | POA: Diagnosis not present

## 2012-11-18 DIAGNOSIS — D631 Anemia in chronic kidney disease: Secondary | ICD-10-CM | POA: Diagnosis not present

## 2012-11-18 DIAGNOSIS — N186 End stage renal disease: Secondary | ICD-10-CM | POA: Diagnosis not present

## 2012-11-18 DIAGNOSIS — D509 Iron deficiency anemia, unspecified: Secondary | ICD-10-CM | POA: Diagnosis not present

## 2012-11-21 DIAGNOSIS — N186 End stage renal disease: Secondary | ICD-10-CM | POA: Diagnosis not present

## 2012-11-21 DIAGNOSIS — N2581 Secondary hyperparathyroidism of renal origin: Secondary | ICD-10-CM | POA: Diagnosis not present

## 2012-11-21 DIAGNOSIS — E1129 Type 2 diabetes mellitus with other diabetic kidney complication: Secondary | ICD-10-CM | POA: Diagnosis not present

## 2012-11-21 DIAGNOSIS — D509 Iron deficiency anemia, unspecified: Secondary | ICD-10-CM | POA: Diagnosis not present

## 2012-11-21 DIAGNOSIS — D631 Anemia in chronic kidney disease: Secondary | ICD-10-CM | POA: Diagnosis not present

## 2012-11-23 DIAGNOSIS — N2581 Secondary hyperparathyroidism of renal origin: Secondary | ICD-10-CM | POA: Diagnosis not present

## 2012-11-23 DIAGNOSIS — E1129 Type 2 diabetes mellitus with other diabetic kidney complication: Secondary | ICD-10-CM | POA: Diagnosis not present

## 2012-11-23 DIAGNOSIS — D509 Iron deficiency anemia, unspecified: Secondary | ICD-10-CM | POA: Diagnosis not present

## 2012-11-23 DIAGNOSIS — N186 End stage renal disease: Secondary | ICD-10-CM | POA: Diagnosis not present

## 2012-11-23 DIAGNOSIS — D631 Anemia in chronic kidney disease: Secondary | ICD-10-CM | POA: Diagnosis not present

## 2012-11-25 DIAGNOSIS — E1129 Type 2 diabetes mellitus with other diabetic kidney complication: Secondary | ICD-10-CM | POA: Diagnosis not present

## 2012-11-25 DIAGNOSIS — D631 Anemia in chronic kidney disease: Secondary | ICD-10-CM | POA: Diagnosis not present

## 2012-11-25 DIAGNOSIS — D509 Iron deficiency anemia, unspecified: Secondary | ICD-10-CM | POA: Diagnosis not present

## 2012-11-25 DIAGNOSIS — N186 End stage renal disease: Secondary | ICD-10-CM | POA: Diagnosis not present

## 2012-11-25 DIAGNOSIS — N2581 Secondary hyperparathyroidism of renal origin: Secondary | ICD-10-CM | POA: Diagnosis not present

## 2012-11-28 DIAGNOSIS — D509 Iron deficiency anemia, unspecified: Secondary | ICD-10-CM | POA: Diagnosis not present

## 2012-11-28 DIAGNOSIS — E1129 Type 2 diabetes mellitus with other diabetic kidney complication: Secondary | ICD-10-CM | POA: Diagnosis not present

## 2012-11-28 DIAGNOSIS — N2581 Secondary hyperparathyroidism of renal origin: Secondary | ICD-10-CM | POA: Diagnosis not present

## 2012-11-28 DIAGNOSIS — N186 End stage renal disease: Secondary | ICD-10-CM | POA: Diagnosis not present

## 2012-11-28 DIAGNOSIS — D631 Anemia in chronic kidney disease: Secondary | ICD-10-CM | POA: Diagnosis not present

## 2012-11-30 DIAGNOSIS — E1129 Type 2 diabetes mellitus with other diabetic kidney complication: Secondary | ICD-10-CM | POA: Diagnosis not present

## 2012-11-30 DIAGNOSIS — D509 Iron deficiency anemia, unspecified: Secondary | ICD-10-CM | POA: Diagnosis not present

## 2012-11-30 DIAGNOSIS — N2581 Secondary hyperparathyroidism of renal origin: Secondary | ICD-10-CM | POA: Diagnosis not present

## 2012-11-30 DIAGNOSIS — D631 Anemia in chronic kidney disease: Secondary | ICD-10-CM | POA: Diagnosis not present

## 2012-11-30 DIAGNOSIS — N186 End stage renal disease: Secondary | ICD-10-CM | POA: Diagnosis not present

## 2012-12-02 DIAGNOSIS — N2581 Secondary hyperparathyroidism of renal origin: Secondary | ICD-10-CM | POA: Diagnosis not present

## 2012-12-02 DIAGNOSIS — D631 Anemia in chronic kidney disease: Secondary | ICD-10-CM | POA: Diagnosis not present

## 2012-12-02 DIAGNOSIS — D509 Iron deficiency anemia, unspecified: Secondary | ICD-10-CM | POA: Diagnosis not present

## 2012-12-02 DIAGNOSIS — N186 End stage renal disease: Secondary | ICD-10-CM | POA: Diagnosis not present

## 2012-12-02 DIAGNOSIS — E1129 Type 2 diabetes mellitus with other diabetic kidney complication: Secondary | ICD-10-CM | POA: Diagnosis not present

## 2012-12-05 DIAGNOSIS — N186 End stage renal disease: Secondary | ICD-10-CM | POA: Diagnosis not present

## 2012-12-05 DIAGNOSIS — N2581 Secondary hyperparathyroidism of renal origin: Secondary | ICD-10-CM | POA: Diagnosis not present

## 2012-12-05 DIAGNOSIS — D631 Anemia in chronic kidney disease: Secondary | ICD-10-CM | POA: Diagnosis not present

## 2012-12-05 DIAGNOSIS — E1129 Type 2 diabetes mellitus with other diabetic kidney complication: Secondary | ICD-10-CM | POA: Diagnosis not present

## 2012-12-05 DIAGNOSIS — D509 Iron deficiency anemia, unspecified: Secondary | ICD-10-CM | POA: Diagnosis not present

## 2012-12-07 DIAGNOSIS — N186 End stage renal disease: Secondary | ICD-10-CM | POA: Diagnosis not present

## 2012-12-07 DIAGNOSIS — N2581 Secondary hyperparathyroidism of renal origin: Secondary | ICD-10-CM | POA: Diagnosis not present

## 2012-12-07 DIAGNOSIS — E1129 Type 2 diabetes mellitus with other diabetic kidney complication: Secondary | ICD-10-CM | POA: Diagnosis not present

## 2012-12-07 DIAGNOSIS — D631 Anemia in chronic kidney disease: Secondary | ICD-10-CM | POA: Diagnosis not present

## 2012-12-07 DIAGNOSIS — D509 Iron deficiency anemia, unspecified: Secondary | ICD-10-CM | POA: Diagnosis not present

## 2012-12-09 DIAGNOSIS — N2581 Secondary hyperparathyroidism of renal origin: Secondary | ICD-10-CM | POA: Diagnosis not present

## 2012-12-09 DIAGNOSIS — E1129 Type 2 diabetes mellitus with other diabetic kidney complication: Secondary | ICD-10-CM | POA: Diagnosis not present

## 2012-12-09 DIAGNOSIS — D509 Iron deficiency anemia, unspecified: Secondary | ICD-10-CM | POA: Diagnosis not present

## 2012-12-09 DIAGNOSIS — D631 Anemia in chronic kidney disease: Secondary | ICD-10-CM | POA: Diagnosis not present

## 2012-12-09 DIAGNOSIS — N186 End stage renal disease: Secondary | ICD-10-CM | POA: Diagnosis not present

## 2012-12-13 DIAGNOSIS — Z79899 Other long term (current) drug therapy: Secondary | ICD-10-CM | POA: Diagnosis not present

## 2012-12-13 DIAGNOSIS — I509 Heart failure, unspecified: Secondary | ICD-10-CM | POA: Diagnosis not present

## 2012-12-13 DIAGNOSIS — Z794 Long term (current) use of insulin: Secondary | ICD-10-CM | POA: Diagnosis not present

## 2012-12-13 DIAGNOSIS — J209 Acute bronchitis, unspecified: Secondary | ICD-10-CM | POA: Diagnosis not present

## 2012-12-13 DIAGNOSIS — I120 Hypertensive chronic kidney disease with stage 5 chronic kidney disease or end stage renal disease: Secondary | ICD-10-CM | POA: Diagnosis not present

## 2012-12-13 DIAGNOSIS — D649 Anemia, unspecified: Secondary | ICD-10-CM | POA: Diagnosis not present

## 2012-12-13 DIAGNOSIS — R0602 Shortness of breath: Secondary | ICD-10-CM | POA: Diagnosis not present

## 2012-12-13 DIAGNOSIS — D631 Anemia in chronic kidney disease: Secondary | ICD-10-CM | POA: Diagnosis not present

## 2012-12-13 DIAGNOSIS — E119 Type 2 diabetes mellitus without complications: Secondary | ICD-10-CM | POA: Diagnosis not present

## 2012-12-13 DIAGNOSIS — Z992 Dependence on renal dialysis: Secondary | ICD-10-CM | POA: Diagnosis not present

## 2012-12-13 DIAGNOSIS — E1129 Type 2 diabetes mellitus with other diabetic kidney complication: Secondary | ICD-10-CM | POA: Diagnosis not present

## 2012-12-13 DIAGNOSIS — M7989 Other specified soft tissue disorders: Secondary | ICD-10-CM | POA: Diagnosis not present

## 2012-12-13 DIAGNOSIS — N186 End stage renal disease: Secondary | ICD-10-CM | POA: Diagnosis not present

## 2012-12-13 DIAGNOSIS — E7800 Pure hypercholesterolemia, unspecified: Secondary | ICD-10-CM | POA: Diagnosis not present

## 2012-12-13 DIAGNOSIS — R6889 Other general symptoms and signs: Secondary | ICD-10-CM | POA: Diagnosis not present

## 2012-12-15 DIAGNOSIS — Z23 Encounter for immunization: Secondary | ICD-10-CM | POA: Diagnosis not present

## 2012-12-15 DIAGNOSIS — N186 End stage renal disease: Secondary | ICD-10-CM | POA: Diagnosis not present

## 2012-12-15 DIAGNOSIS — D631 Anemia in chronic kidney disease: Secondary | ICD-10-CM | POA: Diagnosis not present

## 2013-01-05 DIAGNOSIS — E1129 Type 2 diabetes mellitus with other diabetic kidney complication: Secondary | ICD-10-CM | POA: Diagnosis not present

## 2013-01-13 DIAGNOSIS — N186 End stage renal disease: Secondary | ICD-10-CM | POA: Diagnosis not present

## 2013-01-14 DIAGNOSIS — N186 End stage renal disease: Secondary | ICD-10-CM | POA: Diagnosis not present

## 2013-01-14 DIAGNOSIS — D631 Anemia in chronic kidney disease: Secondary | ICD-10-CM | POA: Diagnosis not present

## 2013-01-14 DIAGNOSIS — N2581 Secondary hyperparathyroidism of renal origin: Secondary | ICD-10-CM | POA: Diagnosis not present

## 2013-01-23 DIAGNOSIS — M1710 Unilateral primary osteoarthritis, unspecified knee: Secondary | ICD-10-CM | POA: Diagnosis not present

## 2013-01-23 DIAGNOSIS — M25569 Pain in unspecified knee: Secondary | ICD-10-CM | POA: Diagnosis not present

## 2013-01-25 DIAGNOSIS — Z794 Long term (current) use of insulin: Secondary | ICD-10-CM | POA: Diagnosis not present

## 2013-01-25 DIAGNOSIS — I120 Hypertensive chronic kidney disease with stage 5 chronic kidney disease or end stage renal disease: Secondary | ICD-10-CM | POA: Diagnosis not present

## 2013-01-25 DIAGNOSIS — Z602 Problems related to living alone: Secondary | ICD-10-CM | POA: Diagnosis not present

## 2013-01-25 DIAGNOSIS — IMO0001 Reserved for inherently not codable concepts without codable children: Secondary | ICD-10-CM | POA: Diagnosis not present

## 2013-01-25 DIAGNOSIS — N186 End stage renal disease: Secondary | ICD-10-CM | POA: Diagnosis not present

## 2013-01-25 DIAGNOSIS — R269 Unspecified abnormalities of gait and mobility: Secondary | ICD-10-CM | POA: Diagnosis not present

## 2013-01-25 DIAGNOSIS — R0602 Shortness of breath: Secondary | ICD-10-CM | POA: Diagnosis not present

## 2013-01-25 DIAGNOSIS — Z9181 History of falling: Secondary | ICD-10-CM | POA: Diagnosis not present

## 2013-01-25 DIAGNOSIS — E11359 Type 2 diabetes mellitus with proliferative diabetic retinopathy without macular edema: Secondary | ICD-10-CM | POA: Diagnosis not present

## 2013-01-25 DIAGNOSIS — E1139 Type 2 diabetes mellitus with other diabetic ophthalmic complication: Secondary | ICD-10-CM | POA: Diagnosis not present

## 2013-01-30 DIAGNOSIS — E1139 Type 2 diabetes mellitus with other diabetic ophthalmic complication: Secondary | ICD-10-CM | POA: Diagnosis not present

## 2013-01-30 DIAGNOSIS — I120 Hypertensive chronic kidney disease with stage 5 chronic kidney disease or end stage renal disease: Secondary | ICD-10-CM | POA: Diagnosis not present

## 2013-01-30 DIAGNOSIS — IMO0001 Reserved for inherently not codable concepts without codable children: Secondary | ICD-10-CM | POA: Diagnosis not present

## 2013-01-30 DIAGNOSIS — N186 End stage renal disease: Secondary | ICD-10-CM | POA: Diagnosis not present

## 2013-01-30 DIAGNOSIS — R269 Unspecified abnormalities of gait and mobility: Secondary | ICD-10-CM | POA: Diagnosis not present

## 2013-01-30 DIAGNOSIS — E11359 Type 2 diabetes mellitus with proliferative diabetic retinopathy without macular edema: Secondary | ICD-10-CM | POA: Diagnosis not present

## 2013-02-01 DIAGNOSIS — I120 Hypertensive chronic kidney disease with stage 5 chronic kidney disease or end stage renal disease: Secondary | ICD-10-CM | POA: Diagnosis not present

## 2013-02-01 DIAGNOSIS — E1139 Type 2 diabetes mellitus with other diabetic ophthalmic complication: Secondary | ICD-10-CM | POA: Diagnosis not present

## 2013-02-01 DIAGNOSIS — N186 End stage renal disease: Secondary | ICD-10-CM | POA: Diagnosis not present

## 2013-02-01 DIAGNOSIS — R269 Unspecified abnormalities of gait and mobility: Secondary | ICD-10-CM | POA: Diagnosis not present

## 2013-02-01 DIAGNOSIS — IMO0001 Reserved for inherently not codable concepts without codable children: Secondary | ICD-10-CM | POA: Diagnosis not present

## 2013-02-01 DIAGNOSIS — E11359 Type 2 diabetes mellitus with proliferative diabetic retinopathy without macular edema: Secondary | ICD-10-CM | POA: Diagnosis not present

## 2013-02-06 DIAGNOSIS — R269 Unspecified abnormalities of gait and mobility: Secondary | ICD-10-CM | POA: Diagnosis not present

## 2013-02-06 DIAGNOSIS — E11359 Type 2 diabetes mellitus with proliferative diabetic retinopathy without macular edema: Secondary | ICD-10-CM | POA: Diagnosis not present

## 2013-02-06 DIAGNOSIS — E1139 Type 2 diabetes mellitus with other diabetic ophthalmic complication: Secondary | ICD-10-CM | POA: Diagnosis not present

## 2013-02-06 DIAGNOSIS — N186 End stage renal disease: Secondary | ICD-10-CM | POA: Diagnosis not present

## 2013-02-06 DIAGNOSIS — I120 Hypertensive chronic kidney disease with stage 5 chronic kidney disease or end stage renal disease: Secondary | ICD-10-CM | POA: Diagnosis not present

## 2013-02-06 DIAGNOSIS — IMO0001 Reserved for inherently not codable concepts without codable children: Secondary | ICD-10-CM | POA: Diagnosis not present

## 2013-02-12 DIAGNOSIS — N186 End stage renal disease: Secondary | ICD-10-CM | POA: Diagnosis not present

## 2013-02-13 DIAGNOSIS — R269 Unspecified abnormalities of gait and mobility: Secondary | ICD-10-CM | POA: Diagnosis not present

## 2013-02-13 DIAGNOSIS — N186 End stage renal disease: Secondary | ICD-10-CM | POA: Diagnosis not present

## 2013-02-13 DIAGNOSIS — E11359 Type 2 diabetes mellitus with proliferative diabetic retinopathy without macular edema: Secondary | ICD-10-CM | POA: Diagnosis not present

## 2013-02-13 DIAGNOSIS — I120 Hypertensive chronic kidney disease with stage 5 chronic kidney disease or end stage renal disease: Secondary | ICD-10-CM | POA: Diagnosis not present

## 2013-02-13 DIAGNOSIS — E1139 Type 2 diabetes mellitus with other diabetic ophthalmic complication: Secondary | ICD-10-CM | POA: Diagnosis not present

## 2013-02-13 DIAGNOSIS — IMO0001 Reserved for inherently not codable concepts without codable children: Secondary | ICD-10-CM | POA: Diagnosis not present

## 2013-02-14 DIAGNOSIS — N2581 Secondary hyperparathyroidism of renal origin: Secondary | ICD-10-CM | POA: Diagnosis not present

## 2013-02-14 DIAGNOSIS — N186 End stage renal disease: Secondary | ICD-10-CM | POA: Diagnosis not present

## 2013-02-14 DIAGNOSIS — T82898A Other specified complication of vascular prosthetic devices, implants and grafts, initial encounter: Secondary | ICD-10-CM | POA: Diagnosis not present

## 2013-02-14 DIAGNOSIS — D631 Anemia in chronic kidney disease: Secondary | ICD-10-CM | POA: Diagnosis not present

## 2013-02-15 DIAGNOSIS — E1139 Type 2 diabetes mellitus with other diabetic ophthalmic complication: Secondary | ICD-10-CM | POA: Diagnosis not present

## 2013-02-15 DIAGNOSIS — E11359 Type 2 diabetes mellitus with proliferative diabetic retinopathy without macular edema: Secondary | ICD-10-CM | POA: Diagnosis not present

## 2013-02-15 DIAGNOSIS — N186 End stage renal disease: Secondary | ICD-10-CM | POA: Diagnosis not present

## 2013-02-15 DIAGNOSIS — I120 Hypertensive chronic kidney disease with stage 5 chronic kidney disease or end stage renal disease: Secondary | ICD-10-CM | POA: Diagnosis not present

## 2013-02-15 DIAGNOSIS — IMO0001 Reserved for inherently not codable concepts without codable children: Secondary | ICD-10-CM | POA: Diagnosis not present

## 2013-02-15 DIAGNOSIS — R269 Unspecified abnormalities of gait and mobility: Secondary | ICD-10-CM | POA: Diagnosis not present

## 2013-02-16 DIAGNOSIS — T82898A Other specified complication of vascular prosthetic devices, implants and grafts, initial encounter: Secondary | ICD-10-CM | POA: Diagnosis not present

## 2013-02-16 DIAGNOSIS — N186 End stage renal disease: Secondary | ICD-10-CM | POA: Diagnosis not present

## 2013-02-16 DIAGNOSIS — D631 Anemia in chronic kidney disease: Secondary | ICD-10-CM | POA: Diagnosis not present

## 2013-02-16 DIAGNOSIS — N2581 Secondary hyperparathyroidism of renal origin: Secondary | ICD-10-CM | POA: Diagnosis not present

## 2013-02-17 DIAGNOSIS — I871 Compression of vein: Secondary | ICD-10-CM | POA: Diagnosis not present

## 2013-02-17 DIAGNOSIS — N186 End stage renal disease: Secondary | ICD-10-CM | POA: Diagnosis not present

## 2013-02-17 DIAGNOSIS — T82898A Other specified complication of vascular prosthetic devices, implants and grafts, initial encounter: Secondary | ICD-10-CM | POA: Diagnosis not present

## 2013-02-18 DIAGNOSIS — N186 End stage renal disease: Secondary | ICD-10-CM | POA: Diagnosis not present

## 2013-02-18 DIAGNOSIS — N2581 Secondary hyperparathyroidism of renal origin: Secondary | ICD-10-CM | POA: Diagnosis not present

## 2013-02-18 DIAGNOSIS — T82898A Other specified complication of vascular prosthetic devices, implants and grafts, initial encounter: Secondary | ICD-10-CM | POA: Diagnosis not present

## 2013-02-18 DIAGNOSIS — D631 Anemia in chronic kidney disease: Secondary | ICD-10-CM | POA: Diagnosis not present

## 2013-02-20 DIAGNOSIS — R269 Unspecified abnormalities of gait and mobility: Secondary | ICD-10-CM | POA: Diagnosis not present

## 2013-02-20 DIAGNOSIS — E1139 Type 2 diabetes mellitus with other diabetic ophthalmic complication: Secondary | ICD-10-CM | POA: Diagnosis not present

## 2013-02-20 DIAGNOSIS — IMO0001 Reserved for inherently not codable concepts without codable children: Secondary | ICD-10-CM | POA: Diagnosis not present

## 2013-02-20 DIAGNOSIS — N186 End stage renal disease: Secondary | ICD-10-CM | POA: Diagnosis not present

## 2013-02-20 DIAGNOSIS — E11359 Type 2 diabetes mellitus with proliferative diabetic retinopathy without macular edema: Secondary | ICD-10-CM | POA: Diagnosis not present

## 2013-02-20 DIAGNOSIS — I120 Hypertensive chronic kidney disease with stage 5 chronic kidney disease or end stage renal disease: Secondary | ICD-10-CM | POA: Diagnosis not present

## 2013-02-21 DIAGNOSIS — N2581 Secondary hyperparathyroidism of renal origin: Secondary | ICD-10-CM | POA: Diagnosis not present

## 2013-02-21 DIAGNOSIS — T82898A Other specified complication of vascular prosthetic devices, implants and grafts, initial encounter: Secondary | ICD-10-CM | POA: Diagnosis not present

## 2013-02-21 DIAGNOSIS — N186 End stage renal disease: Secondary | ICD-10-CM | POA: Diagnosis not present

## 2013-02-21 DIAGNOSIS — D631 Anemia in chronic kidney disease: Secondary | ICD-10-CM | POA: Diagnosis not present

## 2013-02-22 DIAGNOSIS — E1139 Type 2 diabetes mellitus with other diabetic ophthalmic complication: Secondary | ICD-10-CM | POA: Diagnosis not present

## 2013-02-22 DIAGNOSIS — E11359 Type 2 diabetes mellitus with proliferative diabetic retinopathy without macular edema: Secondary | ICD-10-CM | POA: Diagnosis not present

## 2013-02-22 DIAGNOSIS — R269 Unspecified abnormalities of gait and mobility: Secondary | ICD-10-CM | POA: Diagnosis not present

## 2013-02-22 DIAGNOSIS — IMO0001 Reserved for inherently not codable concepts without codable children: Secondary | ICD-10-CM | POA: Diagnosis not present

## 2013-02-22 DIAGNOSIS — I120 Hypertensive chronic kidney disease with stage 5 chronic kidney disease or end stage renal disease: Secondary | ICD-10-CM | POA: Diagnosis not present

## 2013-02-22 DIAGNOSIS — N186 End stage renal disease: Secondary | ICD-10-CM | POA: Diagnosis not present

## 2013-02-23 DIAGNOSIS — T82898A Other specified complication of vascular prosthetic devices, implants and grafts, initial encounter: Secondary | ICD-10-CM | POA: Diagnosis not present

## 2013-02-23 DIAGNOSIS — N186 End stage renal disease: Secondary | ICD-10-CM | POA: Diagnosis not present

## 2013-02-23 DIAGNOSIS — N2581 Secondary hyperparathyroidism of renal origin: Secondary | ICD-10-CM | POA: Diagnosis not present

## 2013-02-23 DIAGNOSIS — D631 Anemia in chronic kidney disease: Secondary | ICD-10-CM | POA: Diagnosis not present

## 2013-02-25 DIAGNOSIS — N186 End stage renal disease: Secondary | ICD-10-CM | POA: Diagnosis not present

## 2013-02-25 DIAGNOSIS — N2581 Secondary hyperparathyroidism of renal origin: Secondary | ICD-10-CM | POA: Diagnosis not present

## 2013-02-25 DIAGNOSIS — T82898A Other specified complication of vascular prosthetic devices, implants and grafts, initial encounter: Secondary | ICD-10-CM | POA: Diagnosis not present

## 2013-02-25 DIAGNOSIS — D631 Anemia in chronic kidney disease: Secondary | ICD-10-CM | POA: Diagnosis not present

## 2013-02-27 DIAGNOSIS — I120 Hypertensive chronic kidney disease with stage 5 chronic kidney disease or end stage renal disease: Secondary | ICD-10-CM | POA: Diagnosis not present

## 2013-02-27 DIAGNOSIS — E11359 Type 2 diabetes mellitus with proliferative diabetic retinopathy without macular edema: Secondary | ICD-10-CM | POA: Diagnosis not present

## 2013-02-27 DIAGNOSIS — R269 Unspecified abnormalities of gait and mobility: Secondary | ICD-10-CM | POA: Diagnosis not present

## 2013-02-27 DIAGNOSIS — IMO0001 Reserved for inherently not codable concepts without codable children: Secondary | ICD-10-CM | POA: Diagnosis not present

## 2013-02-27 DIAGNOSIS — N186 End stage renal disease: Secondary | ICD-10-CM | POA: Diagnosis not present

## 2013-02-27 DIAGNOSIS — E1139 Type 2 diabetes mellitus with other diabetic ophthalmic complication: Secondary | ICD-10-CM | POA: Diagnosis not present

## 2013-02-28 DIAGNOSIS — T82898A Other specified complication of vascular prosthetic devices, implants and grafts, initial encounter: Secondary | ICD-10-CM | POA: Diagnosis not present

## 2013-02-28 DIAGNOSIS — D631 Anemia in chronic kidney disease: Secondary | ICD-10-CM | POA: Diagnosis not present

## 2013-02-28 DIAGNOSIS — N2581 Secondary hyperparathyroidism of renal origin: Secondary | ICD-10-CM | POA: Diagnosis not present

## 2013-02-28 DIAGNOSIS — N186 End stage renal disease: Secondary | ICD-10-CM | POA: Diagnosis not present

## 2013-03-01 DIAGNOSIS — E1139 Type 2 diabetes mellitus with other diabetic ophthalmic complication: Secondary | ICD-10-CM | POA: Diagnosis not present

## 2013-03-01 DIAGNOSIS — IMO0001 Reserved for inherently not codable concepts without codable children: Secondary | ICD-10-CM | POA: Diagnosis not present

## 2013-03-01 DIAGNOSIS — I120 Hypertensive chronic kidney disease with stage 5 chronic kidney disease or end stage renal disease: Secondary | ICD-10-CM | POA: Diagnosis not present

## 2013-03-01 DIAGNOSIS — N186 End stage renal disease: Secondary | ICD-10-CM | POA: Diagnosis not present

## 2013-03-01 DIAGNOSIS — E11359 Type 2 diabetes mellitus with proliferative diabetic retinopathy without macular edema: Secondary | ICD-10-CM | POA: Diagnosis not present

## 2013-03-01 DIAGNOSIS — R269 Unspecified abnormalities of gait and mobility: Secondary | ICD-10-CM | POA: Diagnosis not present

## 2013-03-02 DIAGNOSIS — N186 End stage renal disease: Secondary | ICD-10-CM | POA: Diagnosis not present

## 2013-03-02 DIAGNOSIS — T82898A Other specified complication of vascular prosthetic devices, implants and grafts, initial encounter: Secondary | ICD-10-CM | POA: Diagnosis not present

## 2013-03-02 DIAGNOSIS — N2581 Secondary hyperparathyroidism of renal origin: Secondary | ICD-10-CM | POA: Diagnosis not present

## 2013-03-02 DIAGNOSIS — D631 Anemia in chronic kidney disease: Secondary | ICD-10-CM | POA: Diagnosis not present

## 2013-03-04 DIAGNOSIS — D631 Anemia in chronic kidney disease: Secondary | ICD-10-CM | POA: Diagnosis not present

## 2013-03-04 DIAGNOSIS — N2581 Secondary hyperparathyroidism of renal origin: Secondary | ICD-10-CM | POA: Diagnosis not present

## 2013-03-04 DIAGNOSIS — T82898A Other specified complication of vascular prosthetic devices, implants and grafts, initial encounter: Secondary | ICD-10-CM | POA: Diagnosis not present

## 2013-03-04 DIAGNOSIS — N186 End stage renal disease: Secondary | ICD-10-CM | POA: Diagnosis not present

## 2013-03-06 DIAGNOSIS — N186 End stage renal disease: Secondary | ICD-10-CM | POA: Diagnosis not present

## 2013-03-06 DIAGNOSIS — D631 Anemia in chronic kidney disease: Secondary | ICD-10-CM | POA: Diagnosis not present

## 2013-03-06 DIAGNOSIS — T82898A Other specified complication of vascular prosthetic devices, implants and grafts, initial encounter: Secondary | ICD-10-CM | POA: Diagnosis not present

## 2013-03-06 DIAGNOSIS — N2581 Secondary hyperparathyroidism of renal origin: Secondary | ICD-10-CM | POA: Diagnosis not present

## 2013-03-07 DIAGNOSIS — I120 Hypertensive chronic kidney disease with stage 5 chronic kidney disease or end stage renal disease: Secondary | ICD-10-CM | POA: Diagnosis not present

## 2013-03-07 DIAGNOSIS — R269 Unspecified abnormalities of gait and mobility: Secondary | ICD-10-CM | POA: Diagnosis not present

## 2013-03-07 DIAGNOSIS — N186 End stage renal disease: Secondary | ICD-10-CM | POA: Diagnosis not present

## 2013-03-07 DIAGNOSIS — E1139 Type 2 diabetes mellitus with other diabetic ophthalmic complication: Secondary | ICD-10-CM | POA: Diagnosis not present

## 2013-03-07 DIAGNOSIS — E11359 Type 2 diabetes mellitus with proliferative diabetic retinopathy without macular edema: Secondary | ICD-10-CM | POA: Diagnosis not present

## 2013-03-07 DIAGNOSIS — IMO0001 Reserved for inherently not codable concepts without codable children: Secondary | ICD-10-CM | POA: Diagnosis not present

## 2013-03-08 DIAGNOSIS — T82898A Other specified complication of vascular prosthetic devices, implants and grafts, initial encounter: Secondary | ICD-10-CM | POA: Diagnosis not present

## 2013-03-08 DIAGNOSIS — N186 End stage renal disease: Secondary | ICD-10-CM | POA: Diagnosis not present

## 2013-03-08 DIAGNOSIS — D631 Anemia in chronic kidney disease: Secondary | ICD-10-CM | POA: Diagnosis not present

## 2013-03-08 DIAGNOSIS — N2581 Secondary hyperparathyroidism of renal origin: Secondary | ICD-10-CM | POA: Diagnosis not present

## 2013-03-11 DIAGNOSIS — N186 End stage renal disease: Secondary | ICD-10-CM | POA: Diagnosis not present

## 2013-03-11 DIAGNOSIS — N2581 Secondary hyperparathyroidism of renal origin: Secondary | ICD-10-CM | POA: Diagnosis not present

## 2013-03-11 DIAGNOSIS — T82898A Other specified complication of vascular prosthetic devices, implants and grafts, initial encounter: Secondary | ICD-10-CM | POA: Diagnosis not present

## 2013-03-11 DIAGNOSIS — D631 Anemia in chronic kidney disease: Secondary | ICD-10-CM | POA: Diagnosis not present

## 2013-03-13 DIAGNOSIS — N2581 Secondary hyperparathyroidism of renal origin: Secondary | ICD-10-CM | POA: Diagnosis not present

## 2013-03-13 DIAGNOSIS — N186 End stage renal disease: Secondary | ICD-10-CM | POA: Diagnosis not present

## 2013-03-13 DIAGNOSIS — D631 Anemia in chronic kidney disease: Secondary | ICD-10-CM | POA: Diagnosis not present

## 2013-03-13 DIAGNOSIS — T82898A Other specified complication of vascular prosthetic devices, implants and grafts, initial encounter: Secondary | ICD-10-CM | POA: Diagnosis not present

## 2013-03-15 DIAGNOSIS — N2581 Secondary hyperparathyroidism of renal origin: Secondary | ICD-10-CM | POA: Diagnosis not present

## 2013-03-15 DIAGNOSIS — D631 Anemia in chronic kidney disease: Secondary | ICD-10-CM | POA: Diagnosis not present

## 2013-03-15 DIAGNOSIS — N186 End stage renal disease: Secondary | ICD-10-CM | POA: Diagnosis not present

## 2013-03-15 DIAGNOSIS — T82898A Other specified complication of vascular prosthetic devices, implants and grafts, initial encounter: Secondary | ICD-10-CM | POA: Diagnosis not present

## 2013-03-18 DIAGNOSIS — N186 End stage renal disease: Secondary | ICD-10-CM | POA: Diagnosis not present

## 2013-03-18 DIAGNOSIS — D631 Anemia in chronic kidney disease: Secondary | ICD-10-CM | POA: Diagnosis not present

## 2013-03-18 DIAGNOSIS — N2581 Secondary hyperparathyroidism of renal origin: Secondary | ICD-10-CM | POA: Diagnosis not present

## 2013-03-21 ENCOUNTER — Encounter: Payer: Self-pay | Admitting: Podiatrist

## 2013-03-21 ENCOUNTER — Ambulatory Visit (INDEPENDENT_AMBULATORY_CARE_PROVIDER_SITE_OTHER): Payer: Medicare Other | Admitting: Podiatrist

## 2013-03-21 VITALS — BP 133/72 | HR 75 | Resp 18

## 2013-03-21 DIAGNOSIS — M79609 Pain in unspecified limb: Secondary | ICD-10-CM | POA: Diagnosis not present

## 2013-03-21 DIAGNOSIS — B351 Tinea unguium: Secondary | ICD-10-CM

## 2013-03-21 NOTE — Progress Notes (Signed)
HPI:  Patient presents today for follow up of foot and nail care. Denies any new complaints today.  Objective:  Patients chart is reviewed.  Neurovascular status unchanged.  Patients nails are thickened, discolored, distrophic, friable and brittle with yellow-brown discoloration. Patient subjectively relates they are painful with shoes and with ambulation of bilateral feet. Shallow corn at the distal tip of the left third toe is noted.  Assessment:  Symptomatic onychomycosis  Plan:  Discussed treatment options and alternatives.  The symptomatic toenails were debrided through manual an mechanical means without complication.  Return appointment recommended at routine intervals of 3 months    Trudie Buckler, DPM

## 2013-03-21 NOTE — Patient Instructions (Signed)
Diabetes and Foot Care Diabetes may cause you to have problems because of poor blood supply (circulation) to your feet and legs. This may cause the skin on your feet to become thinner, break easier, and heal more slowly. Your skin may become dry, and the skin may peel and crack. You may also have nerve damage in your legs and feet causing decreased feeling in them. You may not notice minor injuries to your feet that could lead to infections or more serious problems. Taking care of your feet is one of the most important things you can do for yourself.  HOME CARE INSTRUCTIONS  Wear shoes at all times, even in the house. Do not go barefoot. Bare feet are easily injured.  Check your feet daily for blisters, cuts, and redness. If you cannot see the bottom of your feet, use a mirror or ask someone for help.  Wash your feet with warm water (do not use hot water) and mild soap. Then pat your feet and the areas between your toes until they are completely dry. Do not soak your feet as this can dry your skin.  Apply a moisturizing lotion or petroleum jelly (that does not contain alcohol and is unscented) to the skin on your feet and to dry, brittle toenails. Do not apply lotion between your toes.  Trim your toenails straight across. Do not dig under them or around the cuticle. File the edges of your nails with an emery board or nail file.  Do not cut corns or calluses or try to remove them with medicine.  Wear clean socks or stockings every day. Make sure they are not too tight. Do not wear knee-high stockings since they may decrease blood flow to your legs.  Wear shoes that fit properly and have enough cushioning. To break in new shoes, wear them for just a few hours a day. This prevents you from injuring your feet. Always look in your shoes before you put them on to be sure there are no objects inside.  Do not cross your legs. This may decrease the blood flow to your feet.  If you find a minor scrape,  cut, or break in the skin on your feet, keep it and the skin around it clean and dry. These areas may be cleansed with mild soap and water. Do not cleanse the area with peroxide, alcohol, or iodine.  When you remove an adhesive bandage, be sure not to damage the skin around it.  If you have a wound, look at it several times a day to make sure it is healing.  Do not use heating pads or hot water bottles. They may burn your skin. If you have lost feeling in your feet or legs, you may not know it is happening until it is too late.  Make sure your health care provider performs a complete foot exam at least annually or more often if you have foot problems. Report any cuts, sores, or bruises to your health care provider immediately. SEEK MEDICAL CARE IF:   You have an injury that is not healing.  You have cuts or breaks in the skin.  You have an ingrown nail.  You notice redness on your legs or feet.  You feel burning or tingling in your legs or feet.  You have pain or cramps in your legs and feet.  Your legs or feet are numb.  Your feet always feel cold. SEEK IMMEDIATE MEDICAL CARE IF:   There is increasing redness,   swelling, or pain in or around a wound.  There is a red line that goes up your leg.  Pus is coming from a wound.  You develop a fever or as directed by your health care provider.  You notice a bad smell coming from an ulcer or wound. Document Released: 02/28/2000 Document Revised: 11/02/2012 Document Reviewed: 08/09/2012 [employer] Patient Information 2014 [employer].  

## 2013-04-05 DIAGNOSIS — M1710 Unilateral primary osteoarthritis, unspecified knee: Secondary | ICD-10-CM | POA: Diagnosis not present

## 2013-04-12 DIAGNOSIS — M1710 Unilateral primary osteoarthritis, unspecified knee: Secondary | ICD-10-CM | POA: Diagnosis not present

## 2013-04-14 DIAGNOSIS — T82898A Other specified complication of vascular prosthetic devices, implants and grafts, initial encounter: Secondary | ICD-10-CM | POA: Diagnosis not present

## 2013-04-14 DIAGNOSIS — N186 End stage renal disease: Secondary | ICD-10-CM | POA: Diagnosis not present

## 2013-04-14 DIAGNOSIS — Z992 Dependence on renal dialysis: Secondary | ICD-10-CM | POA: Diagnosis not present

## 2013-04-14 DIAGNOSIS — E1129 Type 2 diabetes mellitus with other diabetic kidney complication: Secondary | ICD-10-CM | POA: Diagnosis not present

## 2013-04-15 DIAGNOSIS — N186 End stage renal disease: Secondary | ICD-10-CM | POA: Diagnosis not present

## 2013-04-18 DIAGNOSIS — N2581 Secondary hyperparathyroidism of renal origin: Secondary | ICD-10-CM | POA: Diagnosis not present

## 2013-04-18 DIAGNOSIS — D631 Anemia in chronic kidney disease: Secondary | ICD-10-CM | POA: Diagnosis not present

## 2013-04-18 DIAGNOSIS — N039 Chronic nephritic syndrome with unspecified morphologic changes: Secondary | ICD-10-CM | POA: Diagnosis not present

## 2013-04-18 DIAGNOSIS — N186 End stage renal disease: Secondary | ICD-10-CM | POA: Diagnosis not present

## 2013-04-19 DIAGNOSIS — M1710 Unilateral primary osteoarthritis, unspecified knee: Secondary | ICD-10-CM | POA: Diagnosis not present

## 2013-05-05 ENCOUNTER — Encounter: Payer: Self-pay | Admitting: Surgery

## 2013-05-08 ENCOUNTER — Other Ambulatory Visit (HOSPITAL_COMMUNITY): Payer: Medicare Other

## 2013-05-08 ENCOUNTER — Ambulatory Visit: Payer: Medicare Other | Admitting: Surgery

## 2013-05-08 DIAGNOSIS — N281 Cyst of kidney, acquired: Secondary | ICD-10-CM | POA: Diagnosis not present

## 2013-05-08 DIAGNOSIS — Z01818 Encounter for other preprocedural examination: Secondary | ICD-10-CM | POA: Diagnosis not present

## 2013-05-10 DIAGNOSIS — M1710 Unilateral primary osteoarthritis, unspecified knee: Secondary | ICD-10-CM | POA: Diagnosis not present

## 2013-05-13 DIAGNOSIS — N186 End stage renal disease: Secondary | ICD-10-CM | POA: Diagnosis not present

## 2013-05-16 DIAGNOSIS — N186 End stage renal disease: Secondary | ICD-10-CM | POA: Diagnosis not present

## 2013-05-16 DIAGNOSIS — D631 Anemia in chronic kidney disease: Secondary | ICD-10-CM | POA: Diagnosis not present

## 2013-05-16 DIAGNOSIS — N2581 Secondary hyperparathyroidism of renal origin: Secondary | ICD-10-CM | POA: Diagnosis not present

## 2013-05-18 ENCOUNTER — Other Ambulatory Visit: Payer: Self-pay | Admitting: *Deleted

## 2013-05-18 ENCOUNTER — Encounter: Payer: Self-pay | Admitting: Vascular Surgery

## 2013-05-18 DIAGNOSIS — T82598A Other mechanical complication of other cardiac and vascular devices and implants, initial encounter: Secondary | ICD-10-CM

## 2013-05-19 ENCOUNTER — Ambulatory Visit (INDEPENDENT_AMBULATORY_CARE_PROVIDER_SITE_OTHER): Payer: Medicare Other | Admitting: Vascular Surgery

## 2013-05-19 ENCOUNTER — Ambulatory Visit (HOSPITAL_COMMUNITY)
Admission: RE | Admit: 2013-05-19 | Discharge: 2013-05-19 | Disposition: A | Discharge status: Home / Self Care | Payer: Medicare Other | Source: Ambulatory Visit | Attending: Vascular Surgery | Admitting: Vascular Surgery

## 2013-05-19 ENCOUNTER — Encounter: Payer: Self-pay | Admitting: Vascular Surgery

## 2013-05-19 ENCOUNTER — Other Ambulatory Visit: Payer: Self-pay | Admitting: *Deleted

## 2013-05-19 VITALS — BP 130/94 | HR 68 | Ht 65.0 in | Wt 200.0 lb

## 2013-05-19 DIAGNOSIS — N186 End stage renal disease: Secondary | ICD-10-CM | POA: Diagnosis not present

## 2013-05-19 DIAGNOSIS — T82898A Other specified complication of vascular prosthetic devices, implants and grafts, initial encounter: Secondary | ICD-10-CM | POA: Diagnosis not present

## 2013-05-19 DIAGNOSIS — T82598A Other mechanical complication of other cardiac and vascular devices and implants, initial encounter: Secondary | ICD-10-CM

## 2013-05-19 NOTE — Progress Notes (Signed)
Established Dialysis Access  History of Present Illness  Stefanie [name] is a 68 y.o. ([birth year]) female who presents for re-evaluation of left thigh AVG.  Previous access procedures have been completed in the both arms.  The patient's complication from previous access procedures include: thrombosis.  The left thigh AVG is the patient's current access (placed in 2012).  The patient has undergone multiple venoplasty and percutaneous thrombectomy.  Most recently, she had percutaneous thrombectomy on 04/14/13 by IR with inadequate response by report.  The patent complains of continued L thigh swelling.  Past Medical History  Diagnosis Date  . Hypertension   . Hyperlipidemia   . Diabetes mellitus   . CVA (cerebral vascular accident) [place]    left sided weakness  . Protein malnutrition   . Secondary hyperparathyroidism   . Anemia   . End stage renal disease     initiated HD 2006  . Hx of echocardiogram     a. Echo 03/2010: EF 55-60%, Gr 1 diast dysfn, trivial MS, mild to mod LAE, PASP 34, ;  b. Echo 11/13: mild LVH, EF 60%, Gr 1 diast dysfn, Ao sclerosis, no AS, MAC, slight MS, mean 3 mmHg, PASP 34  . Hx of cardiovascular stress test     a. MV 03/2010:  no ischemia, EF 55%  . CHF (congestive heart failure)   . Retinal detachment, bilateral   . CAD (coronary artery disease)     Past Surgical History  Procedure Laterality Date  . Cataract extraction    . Tubal ligation    . Arteriovenous graft placement      BUE numerous times  . Arteriovenous graft placement  11/13/10    Lt femoral - Dr. Kellie Simmering  . Dg av dialysis graft declot or  06/06/11, 08/26/11    performed in IR    History   Social History  . Marital Status: Single    Spouse Name: N/A    Number of Children: 5  . Years of Education: N/A   Occupational History  .     Social History Main Topics  . Smoking status: Former Smoker    Quit date: 03/17/1983  . Smokeless tobacco: Never Used  . Alcohol Use: No  . Drug  Use: No  . Sexual Activity: Not on file   Other Topics Concern  . Not on file   Social History Narrative  . No narrative on file    Family History  Problem Relation Age of Onset  . Cardiomyopathy    . Heart disease Mother   . Diabetes Mother   . Hypertension Mother   . Heart attack Mother   . Diabetes Other   . Kidney disease Other   . Diabetes Daughter   . Diabetes Son   . Hypertension Son     Current Outpatient Prescriptions on File Prior to Visit  Medication Sig Dispense Refill  . calcium acetate (ELIPHOS) 667 MG tablet Take 667 mg by mouth 3 (three) times daily with meals.       . cinacalcet (SENSIPAR) 60 MG tablet Take 60 mg by mouth daily.        . insulin NPH-insulin regular (NOVOLIN 70/30) (70-30) 100 UNIT/ML injection Inject 20-25 Units into the skin 2 (two) times daily with a meal. 25 units in am 20 units in pm       . isosorbide mononitrate (IMDUR) 30 MG 24 hr tablet Take 30 mg by mouth daily.       Marland Kitchen  Multiple Vitamin (MULITIVITAMIN WITH MINERALS) TABS Take 1 tablet by mouth daily.        . pravastatin (PRAVACHOL) 40 MG tablet Take 40 mg by mouth daily.         No current facility-administered medications on file prior to visit.    No Known Allergies  REVIEW OF SYSTEMS:  (Positives checked otherwise negative)  CARDIOVASCULAR:  []  chest pain, []  chest pressure, []  palpitations, []  shortness of breath when laying flat, []  shortness of breath with exertion,  []  pain in feet when walking, []  pain in feet when laying flat, []  history of blood clot in veins (DVT), []  history of phlebitis, [x]  swelling in legs, []  varicose veins  PULMONARY:  []  productive cough, []  asthma, []  wheezing  NEUROLOGIC:  []  weakness in arms or legs, []  numbness in arms or legs, []  difficulty speaking or slurred speech, []  temporary loss of vision in one eye, []  dizziness  HEMATOLOGIC:  []  bleeding problems, []  problems with blood clotting too easily  MUSCULOSKEL:  []  joint pain, []   joint swelling  GASTROINTEST:  []  vomiting blood, []  blood in stool     GENITOURINARY:  []  burning with urination, []  blood in urine  PSYCHIATRIC:  []  history of major depression  INTEGUMENTARY:  [x]  rashes, []  ulcers   Physical Examination  Filed Vitals:   05/19/13 1138  BP: 130/94  Pulse: 68  Height: 5\' 5"  (1.651 m)  Weight: 200 lb (90.719 kg)  SpO2: 98%   Body mass index is 33.28 kg/(m^2).  General: A&O x 3, WD, elderly  Pulmonary: Sym exp, good air movt, CTAB, no rales, rhonchi, & wheezing  Cardiac: RRR, Nl S1, S2, no Murmurs, rubs or gallops  Vascular: Vessel Right Left  Radial Not Palpable Not Palpable  Ulnar Not Palpable Not Palpable  Brachial Faintly Palpable Faintly Palpable   Gastrointestinal: soft, NTND, -G/R, - HSM, - masses, - CVAT B  Musculoskeletal: M/S 5/5 throughout , Extremities without  ischemic changes , + faintly palpable thrill in access in L thigh, + bruit in access, 1+ edema in L thigh, L lower leg with CVI skin changes, no appreciable edema in Lower leg  Neurologic: Pain and light touch intact in extremities , Motor exam as listed above  Non-Invasive Vascular Imaging  L thigh access duplex  (Date: 05/19/2013):   Patent thigh AVG  Mass adjacent to venous anastomosis with findings concerning for thrombosed PSA  Unable to evaluate distal EIV  Medical Decision Making  Stefanie S Tegethoff is a 68 y.o. 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  I recommend L thigh shuntogram with possible intervention, possible covered stent placement if an active pseudoaneurysm is present.  I do not recommend another percutaneous intervention on this patient.  If she  thromboses, evaluation for a R thigh AVG should occur.  There is no surgical intervention possible in this patient, as the EIV is already stenotic.  There is no more proximal vein to jump to at this point in the L leg. I discussed with the patient the nature of angiographic procedures, especially the limited patencies of any endovascular intervention.  The patient is aware of that the risks of an angiographic procedure include but are  not limited to: bleeding, infection, access site complications, renal failure, embolization, rupture of vessel, dissection, possible need for emergent surgical intervention, possible need for surgical procedures to treat the patient's pathology, anaphylactic reaction to contrast, and stroke and death.   The patient is aware of the risks and agrees to proceed. The patient is scheduled for 16 MAR 15.     Adele Barthel, MD [facility] Office: [phone] Pager: [phone]  05/19/2013, 12:13 PM

## 2013-05-26 ENCOUNTER — Encounter (HOSPITAL_COMMUNITY): Payer: Self-pay | Admitting: Pharmacy Technician

## 2013-05-29 ENCOUNTER — Other Ambulatory Visit: Payer: Self-pay | Admitting: *Deleted

## 2013-05-29 ENCOUNTER — Encounter (HOSPITAL_COMMUNITY)
Admission: RE | Disposition: A | Discharge status: Home / Self Care | Payer: Self-pay | Source: Ambulatory Visit | Attending: Vascular Surgery

## 2013-05-29 ENCOUNTER — Ambulatory Visit (HOSPITAL_COMMUNITY)
Admission: RE | Admit: 2013-05-29 | Discharge: 2013-05-29 | Disposition: A | Discharge status: Home / Self Care | Payer: Medicare Other | Source: Ambulatory Visit | Attending: Vascular Surgery | Admitting: Vascular Surgery

## 2013-05-29 ENCOUNTER — Telehealth: Payer: Self-pay | Admitting: Vascular Surgery

## 2013-05-29 DIAGNOSIS — I2510 Atherosclerotic heart disease of native coronary artery without angina pectoris: Secondary | ICD-10-CM | POA: Diagnosis not present

## 2013-05-29 DIAGNOSIS — T82898A Other specified complication of vascular prosthetic devices, implants and grafts, initial encounter: Secondary | ICD-10-CM | POA: Diagnosis not present

## 2013-05-29 DIAGNOSIS — I509 Heart failure, unspecified: Secondary | ICD-10-CM | POA: Insufficient documentation

## 2013-05-29 DIAGNOSIS — D649 Anemia, unspecified: Secondary | ICD-10-CM | POA: Insufficient documentation

## 2013-05-29 DIAGNOSIS — Z992 Dependence on renal dialysis: Secondary | ICD-10-CM | POA: Diagnosis not present

## 2013-05-29 DIAGNOSIS — E119 Type 2 diabetes mellitus without complications: Secondary | ICD-10-CM | POA: Insufficient documentation

## 2013-05-29 DIAGNOSIS — T82598A Other mechanical complication of other cardiac and vascular devices and implants, initial encounter: Secondary | ICD-10-CM | POA: Insufficient documentation

## 2013-05-29 DIAGNOSIS — H3320 Serous retinal detachment, unspecified eye: Secondary | ICD-10-CM | POA: Insufficient documentation

## 2013-05-29 DIAGNOSIS — E46 Unspecified protein-calorie malnutrition: Secondary | ICD-10-CM | POA: Diagnosis not present

## 2013-05-29 DIAGNOSIS — Y832 Surgical operation with anastomosis, bypass or graft as the cause of abnormal reaction of the patient, or of later complication, without mention of misadventure at the time of the procedure: Secondary | ICD-10-CM | POA: Insufficient documentation

## 2013-05-29 DIAGNOSIS — R29898 Other symptoms and signs involving the musculoskeletal system: Secondary | ICD-10-CM | POA: Insufficient documentation

## 2013-05-29 DIAGNOSIS — Z794 Long term (current) use of insulin: Secondary | ICD-10-CM | POA: Diagnosis not present

## 2013-05-29 DIAGNOSIS — E785 Hyperlipidemia, unspecified: Secondary | ICD-10-CM | POA: Insufficient documentation

## 2013-05-29 DIAGNOSIS — I120 Hypertensive chronic kidney disease with stage 5 chronic kidney disease or end stage renal disease: Secondary | ICD-10-CM | POA: Insufficient documentation

## 2013-05-29 DIAGNOSIS — N2581 Secondary hyperparathyroidism of renal origin: Secondary | ICD-10-CM | POA: Insufficient documentation

## 2013-05-29 DIAGNOSIS — Z4931 Encounter for adequacy testing for hemodialysis: Secondary | ICD-10-CM

## 2013-05-29 DIAGNOSIS — I69998 Other sequelae following unspecified cerebrovascular disease: Secondary | ICD-10-CM | POA: Diagnosis not present

## 2013-05-29 DIAGNOSIS — I871 Compression of vein: Secondary | ICD-10-CM | POA: Diagnosis not present

## 2013-05-29 DIAGNOSIS — N186 End stage renal disease: Secondary | ICD-10-CM | POA: Insufficient documentation

## 2013-05-29 DIAGNOSIS — Z87891 Personal history of nicotine dependence: Secondary | ICD-10-CM | POA: Diagnosis not present

## 2013-05-29 DIAGNOSIS — I82819 Embolism and thrombosis of superficial veins of unspecified lower extremities: Secondary | ICD-10-CM | POA: Insufficient documentation

## 2013-05-29 HISTORY — PX: SHUNTOGRAM: SHX5491

## 2013-05-29 LAB — POCT I-STAT, CHEM 8
BUN: 60 mg/dL — ABNORMAL HIGH (ref 6–23)
CALCIUM ION: 1.07 mmol/L — AB (ref 1.13–1.30)
CREATININE: 13.7 mg/dL — AB (ref 0.50–1.10)
Chloride: 99 mEq/L (ref 96–112)
Glucose, Bld: 73 mg/dL (ref 70–99)
HCT: 33 % — ABNORMAL LOW (ref 36.0–46.0)
HEMOGLOBIN: 11.2 g/dL — AB (ref 12.0–15.0)
Potassium: 4.8 mEq/L (ref 3.7–5.3)
SODIUM: 141 meq/L (ref 137–147)
TCO2: 28 mmol/L (ref 0–100)

## 2013-05-29 SURGERY — ASSESSMENT, SHUNT FUNCTION, WITH CONTRAST RADIOGRAPHIC STUDY
Anesthesia: LOCAL

## 2013-05-29 MED ORDER — LIDOCAINE HCL (PF) 1 % IJ SOLN
INTRAMUSCULAR | Status: AC
  Filled 2013-05-29: qty 30

## 2013-05-29 MED ORDER — ACETAMINOPHEN 325 MG PO TABS
650.0000 mg | ORAL_TABLET | ORAL | Status: DC | PRN
  Administered 2013-05-29: 650 mg via ORAL
  Filled 2013-05-29: qty 2

## 2013-05-29 MED ORDER — HEPARIN SODIUM (PORCINE) 1000 UNIT/ML IJ SOLN
INTRAMUSCULAR | Status: AC
  Filled 2013-05-29: qty 1

## 2013-05-29 MED ORDER — SODIUM CHLORIDE 0.9 % IJ SOLN: 3.0000 mL | INTRAMUSCULAR | Status: DC | PRN

## 2013-05-29 MED ORDER — HYDRALAZINE HCL 20 MG/ML IJ SOLN
INTRAMUSCULAR | Status: AC
  Filled 2013-05-29: qty 1

## 2013-05-29 MED ORDER — SODIUM CHLORIDE 0.9 % IJ SOLN: 3.0000 mL | Freq: Two times a day (BID) | INTRAMUSCULAR | Status: DC

## 2013-05-29 MED ORDER — SODIUM CHLORIDE 0.9 % IV SOLN
250.0000 mL | INTRAVENOUS | Status: DC | PRN
Start: 2013-05-29 — End: 2013-05-29

## 2013-05-29 MED ORDER — HEPARIN (PORCINE) IN NACL 2-0.9 UNIT/ML-% IJ SOLN
INTRAMUSCULAR | Status: AC
  Filled 2013-05-29: qty 500

## 2013-05-29 MED ORDER — SODIUM CHLORIDE 0.9 % IV SOLN: 250.0000 mL | INTRAVENOUS | Status: DC | PRN

## 2013-05-29 NOTE — Interval H&P Note (Signed)
[facility]  History and Physical Update  The patient was interviewed and re-examined.  The patient's previous History and Physical has been reviewed and is unchanged.  There is no change in the plan of care: L thigh shuntogram, possible intervention.  Adele Barthel, MD [facility] [place] Office: [phone] Pager: [phone]  05/29/2013, 9:41 AM

## 2013-05-29 NOTE — H&P (View-Only) (Signed)
Established Dialysis Access  History of Present Illness  Stefanie [name] is a 68 y.o. (1945-03-30) female who presents for re-evaluation of left thigh AVG.  Previous access procedures have been completed in the both arms.  The patient's complication from previous access procedures include: thrombosis.  The left thigh AVG is the patient's current access (placed in 2012).  The patient has undergone multiple venoplasty and percutaneous thrombectomy.  Most recently, she had percutaneous thrombectomy on 04/14/13 by IR with inadequate response by report.  The patent complains of continued L thigh swelling.  Past Medical History  Diagnosis Date  . Hypertension   . Hyperlipidemia   . Diabetes mellitus   . CVA (cerebral vascular accident) [facility]    left sided weakness  . Protein malnutrition   . Secondary hyperparathyroidism   . Anemia   . End stage renal disease     initiated HD 2006  . Hx of echocardiogram     a. Echo 03/2010: EF 55-60%, Gr 1 diast dysfn, trivial MS, mild to mod LAE, PASP 34, ;  b. Echo 11/13: mild LVH, EF 60%, Gr 1 diast dysfn, Ao sclerosis, no AS, MAC, slight MS, mean 3 mmHg, PASP 34  . Hx of cardiovascular stress test     a. MV 03/2010:  no ischemia, EF 55%  . CHF (congestive heart failure)   . Retinal detachment, bilateral   . CAD (coronary artery disease)     Past Surgical History  Procedure Laterality Date  . Cataract extraction    . Tubal ligation    . Arteriovenous graft placement      BUE numerous times  . Arteriovenous graft placement  11/13/10    Lt femoral - Dr. Kellie Simmering  . Dg av dialysis graft declot or  06/06/11, 08/26/11    performed in IR    History   Social History  . Marital Status: Single    Spouse Name: N/A    Number of Children: 5  . Years of Education: N/A   Occupational History  .     Social History Main Topics  . Smoking status: Former Smoker    Quit date: 03/17/1983  . Smokeless tobacco: Never Used  . Alcohol Use: No  . Drug  Use: No  . Sexual Activity: Not on file   Other Topics Concern  . Not on file   Social History Narrative  . No narrative on file    Family History  Problem Relation Age of Onset  . Cardiomyopathy    . Heart disease Mother   . Diabetes Mother   . Hypertension Mother   . Heart attack Mother   . Diabetes Other   . Kidney disease Other   . Diabetes Daughter   . Diabetes Son   . Hypertension Son     Current Outpatient Prescriptions on File Prior to Visit  Medication Sig Dispense Refill  . calcium acetate (ELIPHOS) 667 MG tablet Take 667 mg by mouth 3 (three) times daily with meals.       . cinacalcet (SENSIPAR) 60 MG tablet Take 60 mg by mouth daily.        . insulin NPH-insulin regular (NOVOLIN 70/30) (70-30) 100 UNIT/ML injection Inject 20-25 Units into the skin 2 (two) times daily with a meal. 25 units in am 20 units in pm       . isosorbide mononitrate (IMDUR) 30 MG 24 hr tablet Take 30 mg by mouth daily.       Marland Kitchen  Multiple Vitamin (MULITIVITAMIN WITH MINERALS) TABS Take 1 tablet by mouth daily.        . pravastatin (PRAVACHOL) 40 MG tablet Take 40 mg by mouth daily.         No current facility-administered medications on file prior to visit.    No Known Allergies  REVIEW OF SYSTEMS:  (Positives checked otherwise negative)  CARDIOVASCULAR:  []  chest pain, []  chest pressure, []  palpitations, []  shortness of breath when laying flat, []  shortness of breath with exertion,  []  pain in feet when walking, []  pain in feet when laying flat, []  history of blood clot in veins (DVT), []  history of phlebitis, [x]  swelling in legs, []  varicose veins  PULMONARY:  []  productive cough, []  asthma, []  wheezing  NEUROLOGIC:  []  weakness in arms or legs, []  numbness in arms or legs, []  difficulty speaking or slurred speech, []  temporary loss of vision in one eye, []  dizziness  HEMATOLOGIC:  []  bleeding problems, []  problems with blood clotting too easily  MUSCULOSKEL:  []  joint pain, []   joint swelling  GASTROINTEST:  []  vomiting blood, []  blood in stool     GENITOURINARY:  []  burning with urination, []  blood in urine  PSYCHIATRIC:  []  history of major depression  INTEGUMENTARY:  [x]  rashes, []  ulcers   Physical Examination  Filed Vitals:   05/19/13 1138  BP: 130/94  Pulse: 68  Height: 5\' 5"  (1.651 m)  Weight: 200 lb (90.719 kg)  SpO2: 98%   Body mass index is 33.28 kg/(m^2).  General: A&O x 3, WD, elderly  Pulmonary: Sym exp, good air movt, CTAB, no rales, rhonchi, & wheezing  Cardiac: RRR, Nl S1, S2, no Murmurs, rubs or gallops  Vascular: Vessel Right Left  Radial Not Palpable Not Palpable  Ulnar Not Palpable Not Palpable  Brachial Faintly Palpable Faintly Palpable   Gastrointestinal: soft, NTND, -G/R, - HSM, - masses, - CVAT B  Musculoskeletal: M/S 5/5 throughout , Extremities without  ischemic changes , + faintly palpable thrill in access in L thigh, + bruit in access, 1+ edema in L thigh, L lower leg with CVI skin changes, no appreciable edema in Lower leg  Neurologic: Pain and light touch intact in extremities , Motor exam as listed above  Non-Invasive Vascular Imaging  L thigh access duplex  (Date: 05/19/2013):   Patent thigh AVG  Mass adjacent to venous anastomosis with findings concerning for thrombosed PSA  Unable to evaluate distal EIV  Medical Decision Making  Stefanie [name] is a 68 y.o. 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  I recommend L thigh shuntogram with possible intervention, possible covered stent placement if an active pseudoaneurysm is present.  I do not recommend another percutaneous intervention on this patient.  If she  thromboses, evaluation for a R thigh AVG should occur.  There is no surgical intervention possible in this patient, as the EIV is already stenotic.  There is no more proximal vein to jump to at this point in the L leg. I discussed with the patient the nature of angiographic procedures, especially the limited patencies of any endovascular intervention.  The patient is aware of that the risks of an angiographic procedure include but are  not limited to: bleeding, infection, access site complications, renal failure, embolization, rupture of vessel, dissection, possible need for emergent surgical intervention, possible need for surgical procedures to treat the patient's pathology, anaphylactic reaction to contrast, and stroke and death.   The patient is aware of the risks and agrees to proceed. The patient is scheduled for 16 MAR 15.     Adele Barthel, MD [facility] Office: [phone] Pager: [phone]  05/19/2013, 12:13 PM

## 2013-05-29 NOTE — Op Note (Signed)
OPERATIVE NOTE   PROCEDURE: 1.  left thigh arteriovenous graft cannulation under ultrasound guidance 2.  left thigh shuntogram 3.  Venoplasty of left femoral vein x 4 (4 mm x 80 mm, 6 mm x 80 mm, 8 mm x 80 mm, 10 mm x 60 mm) 4.  Venoplasty of left external iliac vein  x 4 (4 mm x 80 mm, 6 mm x 80 mm, 8 mm x 80 mm, 10 mm x 60 mm)  PRE-OPERATIVE DIAGNOSIS: Malfunctioning left thigh arteriovenous graft   POST-OPERATIVE DIAGNOSIS: same as above   SURGEON: Adele Barthel, MD  ANESTHESIA: local  ESTIMATED BLOOD LOSS: 5 cc  FINDING(S): 1. Occlusion of left common femoral vein: resolved with serial venoplasty 2. 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 female who presents with malfunctioning left thigh arteriovenous graft with left leg swelling.  The patient is scheduled for left thigh shuntogram.  The patient is aware the risks include but are not limited to: bleeding, infection, thrombosis of the cannulated access, and possible anaphylactic reaction to the contrast.  The patient is aware of the risks of the procedure and elects to proceed forward.  DESCRIPTION: After full informed written consent was obtained, the patient was brought back to the angiography suite and placed supine upon the angiography table.  The patient was connected to monitoring equipment.  The left thigh was prepped and draped in the standard fashion for a percutaneous access intervention.  Under ultrasound guidance, the left thigh arteriovenous graft was cannulated with a micropuncture needle.  The microwire was advanced into the fistula and the needle was exchanged for the a microsheath, which was lodged 2 cm into the access.  The wire was removed and the sheath was connected to the IV extension tubing.  Hand injections were completed to image the access from the antecubitum up to the level of  axilla.  The central venous structures were also imaged by hand injections.  Based on the images, this patient will need: attempt at endovascular recannulation.  A Benson wire was advanced into the axillary vein and the sheath was exchanged for a short 6-Fr sheath.  I loaded a KMP catheter over the wire and then exchanged the wire for a Glidewire.  Using this combination, I was able to cross the left common femoral vein occlusion.  I exchanged the wire for a Benson wire.  Based on the the imaging, a 4 mm x 80 mm angioplasty balloon was selected.  The balloon was centered around the occlusion and inflated to 10 atm for 1 minute.  I deflated the balloon and centered the balloon on the left external iliac vein stenosis and inflated to 10 atm for 1 minute.  On completion imaging, no rupture was evident and a common femoral vein lumen was present.  At this point, the balloon was exchanged for a 6 mm x 80 mm angioplasty balloon.  The balloon was centered around the occlusion and inflated to 18 atm for 2 minutes.   The balloon was deflated and recentered on the left external iliac vein stenosis.  I inflated the balloon to 18 atm for 2 minutes.   On completion imaging, no rupture was evident and there was >30% residual stenosis in both areas of interest.  At this point, the balloon was exchanged for a 8 mm x 80 mm angioplasty balloon.  The balloon  was centered around the occlusion and inflated to 18 atm for 2 minutes.   The balloon was deflated and recentered on the left external iliac vein stenosis.  I inflated the balloon to 18 atm for 2 minutes.  On completion imaging, there remained >30% residual stenosis.  At this point, I exchanged the sheath for a 7-Fr short sheath.  At this point, the balloon was exchanged for a 10 mm x 60 mm angioplasty balloon.  The balloon was centered around the occlusion and inflated to 10 atm for 2 minutes.   The balloon was deflated and recentered on the left external iliac vein stenosis.  I  inflated the balloon to 10 atm for 2 minutes.  On completion imaging, there was near resolution of both the external iliac vein stenosis and the common femoral vein occlusion.  I felt there was no need for a stent placement as the lumen present was minimally 5 mm.  Based on the completion imaging, no further intervention is necessary.  The wire and balloon were removed from the sheath.  A 4-0 Monocryl purse-string suture was sewn around the sheath.  The sheath was removed while tying down the suture.  A sterile bandage was applied to the puncture site.  COMPLICATIONS: none  CONDITION: stable  Adele Barthel, MD [facility] [place] Office: [phone] Pager: [phone]  05/29/2013 11:50 AM

## 2013-05-29 NOTE — Discharge Instructions (Signed)
Call for any signs or symptoms of infection: fever >101, swelling, redness or oozing. Call if there is any numbness or tingling to the left foot.    AV Fistula Care After Refer to this sheet in the next few weeks. These instructions provide you with information on caring for yourself after your procedure. Your caregiver may also give you more specific instructions. Your treatment has been planned according to current medical practices, but problems sometimes occur. Call your caregiver if you have any problems or questions after your procedure. HOME CARE INSTRUCTIONS   Do not drive a car or take public transportation alone.  Do not drink alcohol.  Only take medicine that has been prescribed by your caregiver.  Do not sign important papers or make important decisions.  Have a responsible person with you.  Ask your caregiver to show you how to check your access at home for a vibration (called a "thrill") or for a sound (called a "bruit" pronounced brew-ee).  Keep dressings clean and dry.  Rest.  Use the leg as usual for all activities.  Do not wear tight clothing around the access site.  Do not use creams or lotions over the access site. SEEK MEDICAL CARE IF:   You have a fever.  You have swelling around the fistula that gets worse, or you have new pain.  You have unusual bleeding at the fistula site or from any other area.  You have pus or other drainage at the fistula site.  You have skin redness or red streaking on the skin around, above, or below the fistula site.  Your access site feels warm.  You have any flu-like symptoms. SEEK IMMEDIATE MEDICAL CARE IF:   You have pain, numbness, or an unusual pale skin in the foot.  You have dizziness or weakness that you have not had before.  You have shortness of breath.  You have chest pain.  Your fistula disconnects or breaks, and there is bleeding that cannot be easily controlled. Call for local emergency medical  help. Do not try to drive yourself to the hospital. MAKE SURE YOU  Understand these instructions.  Will watch your condition.  Will get help right away if you are not doing well or get worse. Document Released: 03/02/2005 Document Revised: 05/25/2011 Document Reviewed: 08/20/2010 [facility] Patient Information 2014 [place], [place].

## 2013-05-29 NOTE — Telephone Encounter (Addendum)
Message copied by Gena Fray on Mon May 29, 2013  2:38 PM ------      Message from: Denman George      Created: Mon May 29, 2013 12:46 PM      Regarding: Zigmund Daniel log                   ----- Message -----         From: Conrad Delano, MD         Sent: 05/29/2013  12:01 PM           To: Vvs Charge Pool            [name]      HP:[id number]      [id number]            PROCEDURE:      1.  left thigh arteriovenous graft cannulation under ultrasound guidance      2.  left thigh shuntogram      3.  Venoplasty of left femoral vein x 4 (4 mm x 80 mm, 6 mm x 80 mm, 8 mm x 80 mm, 10 mm x 60 mm)      4.  Venoplasty of left external iliac vein  x 4 (4 mm x 80 mm, 6 mm x 80 mm, 8 mm x 80 mm, 10 mm x 60 mm)            Follow-up: 3 months            Orders(s) for follow-up: L thigh access duplex       ------  05/29/13: Voicemail is not activated on patients phone. I have mailed a letter with appointment information, dpm

## 2013-05-31 DIAGNOSIS — H5710 Ocular pain, unspecified eye: Secondary | ICD-10-CM | POA: Diagnosis not present

## 2013-06-04 DIAGNOSIS — Z9181 History of falling: Secondary | ICD-10-CM | POA: Diagnosis not present

## 2013-06-04 DIAGNOSIS — Z602 Problems related to living alone: Secondary | ICD-10-CM | POA: Diagnosis not present

## 2013-06-04 DIAGNOSIS — M6281 Muscle weakness (generalized): Secondary | ICD-10-CM | POA: Diagnosis not present

## 2013-06-04 DIAGNOSIS — R269 Unspecified abnormalities of gait and mobility: Secondary | ICD-10-CM | POA: Diagnosis not present

## 2013-06-04 DIAGNOSIS — N186 End stage renal disease: Secondary | ICD-10-CM | POA: Diagnosis not present

## 2013-06-04 DIAGNOSIS — E119 Type 2 diabetes mellitus without complications: Secondary | ICD-10-CM | POA: Diagnosis not present

## 2013-06-04 DIAGNOSIS — IMO0001 Reserved for inherently not codable concepts without codable children: Secondary | ICD-10-CM | POA: Diagnosis not present

## 2013-06-05 ENCOUNTER — Other Ambulatory Visit (HOSPITAL_COMMUNITY): Payer: Medicare Other

## 2013-06-05 ENCOUNTER — Ambulatory Visit: Payer: Medicare Other | Admitting: Surgery

## 2013-06-05 DIAGNOSIS — H2000 Unspecified acute and subacute iridocyclitis: Secondary | ICD-10-CM | POA: Diagnosis not present

## 2013-06-05 DIAGNOSIS — N058 Unspecified nephritic syndrome with other morphologic changes: Secondary | ICD-10-CM | POA: Diagnosis not present

## 2013-06-05 DIAGNOSIS — E1129 Type 2 diabetes mellitus with other diabetic kidney complication: Secondary | ICD-10-CM | POA: Diagnosis not present

## 2013-06-07 DIAGNOSIS — IMO0001 Reserved for inherently not codable concepts without codable children: Secondary | ICD-10-CM | POA: Diagnosis not present

## 2013-06-07 DIAGNOSIS — E119 Type 2 diabetes mellitus without complications: Secondary | ICD-10-CM | POA: Diagnosis not present

## 2013-06-07 DIAGNOSIS — N186 End stage renal disease: Secondary | ICD-10-CM | POA: Diagnosis not present

## 2013-06-07 DIAGNOSIS — R269 Unspecified abnormalities of gait and mobility: Secondary | ICD-10-CM | POA: Diagnosis not present

## 2013-06-07 DIAGNOSIS — Z9181 History of falling: Secondary | ICD-10-CM | POA: Diagnosis not present

## 2013-06-07 DIAGNOSIS — M6281 Muscle weakness (generalized): Secondary | ICD-10-CM | POA: Diagnosis not present

## 2013-06-12 DIAGNOSIS — IMO0001 Reserved for inherently not codable concepts without codable children: Secondary | ICD-10-CM | POA: Diagnosis not present

## 2013-06-12 DIAGNOSIS — Z9181 History of falling: Secondary | ICD-10-CM | POA: Diagnosis not present

## 2013-06-12 DIAGNOSIS — M6281 Muscle weakness (generalized): Secondary | ICD-10-CM | POA: Diagnosis not present

## 2013-06-12 DIAGNOSIS — R269 Unspecified abnormalities of gait and mobility: Secondary | ICD-10-CM | POA: Diagnosis not present

## 2013-06-12 DIAGNOSIS — N186 End stage renal disease: Secondary | ICD-10-CM | POA: Diagnosis not present

## 2013-06-12 DIAGNOSIS — E119 Type 2 diabetes mellitus without complications: Secondary | ICD-10-CM | POA: Diagnosis not present

## 2013-06-13 DIAGNOSIS — N186 End stage renal disease: Secondary | ICD-10-CM | POA: Diagnosis not present

## 2013-06-14 DIAGNOSIS — L738 Other specified follicular disorders: Secondary | ICD-10-CM | POA: Diagnosis not present

## 2013-06-14 DIAGNOSIS — R609 Edema, unspecified: Secondary | ICD-10-CM | POA: Diagnosis not present

## 2013-06-14 DIAGNOSIS — E119 Type 2 diabetes mellitus without complications: Secondary | ICD-10-CM | POA: Diagnosis not present

## 2013-06-14 DIAGNOSIS — D649 Anemia, unspecified: Secondary | ICD-10-CM | POA: Diagnosis not present

## 2013-06-14 DIAGNOSIS — E785 Hyperlipidemia, unspecified: Secondary | ICD-10-CM | POA: Diagnosis not present

## 2013-06-14 DIAGNOSIS — M6281 Muscle weakness (generalized): Secondary | ICD-10-CM | POA: Diagnosis not present

## 2013-06-14 DIAGNOSIS — IMO0001 Reserved for inherently not codable concepts without codable children: Secondary | ICD-10-CM | POA: Diagnosis not present

## 2013-06-14 DIAGNOSIS — M25569 Pain in unspecified knee: Secondary | ICD-10-CM | POA: Diagnosis not present

## 2013-06-14 DIAGNOSIS — E1149 Type 2 diabetes mellitus with other diabetic neurological complication: Secondary | ICD-10-CM | POA: Diagnosis not present

## 2013-06-14 DIAGNOSIS — E1142 Type 2 diabetes mellitus with diabetic polyneuropathy: Secondary | ICD-10-CM | POA: Diagnosis not present

## 2013-06-14 DIAGNOSIS — Z9181 History of falling: Secondary | ICD-10-CM | POA: Diagnosis not present

## 2013-06-14 DIAGNOSIS — I10 Essential (primary) hypertension: Secondary | ICD-10-CM | POA: Diagnosis not present

## 2013-06-14 DIAGNOSIS — N186 End stage renal disease: Secondary | ICD-10-CM | POA: Diagnosis not present

## 2013-06-14 DIAGNOSIS — R269 Unspecified abnormalities of gait and mobility: Secondary | ICD-10-CM | POA: Diagnosis not present

## 2013-06-15 DIAGNOSIS — D631 Anemia in chronic kidney disease: Secondary | ICD-10-CM | POA: Diagnosis not present

## 2013-06-15 DIAGNOSIS — N039 Chronic nephritic syndrome with unspecified morphologic changes: Secondary | ICD-10-CM | POA: Diagnosis not present

## 2013-06-15 DIAGNOSIS — E1139 Type 2 diabetes mellitus with other diabetic ophthalmic complication: Secondary | ICD-10-CM | POA: Diagnosis not present

## 2013-06-15 DIAGNOSIS — N186 End stage renal disease: Secondary | ICD-10-CM | POA: Diagnosis not present

## 2013-06-15 DIAGNOSIS — E785 Hyperlipidemia, unspecified: Secondary | ICD-10-CM | POA: Diagnosis not present

## 2013-06-15 DIAGNOSIS — D509 Iron deficiency anemia, unspecified: Secondary | ICD-10-CM | POA: Diagnosis not present

## 2013-06-15 DIAGNOSIS — N2581 Secondary hyperparathyroidism of renal origin: Secondary | ICD-10-CM | POA: Diagnosis not present

## 2013-06-20 ENCOUNTER — Ambulatory Visit: Payer: Medicare Other | Admitting: Podiatrist

## 2013-06-21 DIAGNOSIS — M6281 Muscle weakness (generalized): Secondary | ICD-10-CM | POA: Diagnosis not present

## 2013-06-21 DIAGNOSIS — N186 End stage renal disease: Secondary | ICD-10-CM | POA: Diagnosis not present

## 2013-06-21 DIAGNOSIS — Z9181 History of falling: Secondary | ICD-10-CM | POA: Diagnosis not present

## 2013-06-21 DIAGNOSIS — IMO0001 Reserved for inherently not codable concepts without codable children: Secondary | ICD-10-CM | POA: Diagnosis not present

## 2013-06-21 DIAGNOSIS — E119 Type 2 diabetes mellitus without complications: Secondary | ICD-10-CM | POA: Diagnosis not present

## 2013-06-21 DIAGNOSIS — R269 Unspecified abnormalities of gait and mobility: Secondary | ICD-10-CM | POA: Diagnosis not present

## 2013-06-23 DIAGNOSIS — R269 Unspecified abnormalities of gait and mobility: Secondary | ICD-10-CM | POA: Diagnosis not present

## 2013-06-23 DIAGNOSIS — IMO0001 Reserved for inherently not codable concepts without codable children: Secondary | ICD-10-CM | POA: Diagnosis not present

## 2013-06-23 DIAGNOSIS — M6281 Muscle weakness (generalized): Secondary | ICD-10-CM | POA: Diagnosis not present

## 2013-06-23 DIAGNOSIS — Z9181 History of falling: Secondary | ICD-10-CM | POA: Diagnosis not present

## 2013-06-23 DIAGNOSIS — N186 End stage renal disease: Secondary | ICD-10-CM | POA: Diagnosis not present

## 2013-06-23 DIAGNOSIS — E119 Type 2 diabetes mellitus without complications: Secondary | ICD-10-CM | POA: Diagnosis not present

## 2013-06-26 DIAGNOSIS — Z9181 History of falling: Secondary | ICD-10-CM | POA: Diagnosis not present

## 2013-06-26 DIAGNOSIS — M6281 Muscle weakness (generalized): Secondary | ICD-10-CM | POA: Diagnosis not present

## 2013-06-26 DIAGNOSIS — E119 Type 2 diabetes mellitus without complications: Secondary | ICD-10-CM | POA: Diagnosis not present

## 2013-06-26 DIAGNOSIS — IMO0001 Reserved for inherently not codable concepts without codable children: Secondary | ICD-10-CM | POA: Diagnosis not present

## 2013-06-26 DIAGNOSIS — N186 End stage renal disease: Secondary | ICD-10-CM | POA: Diagnosis not present

## 2013-06-26 DIAGNOSIS — R269 Unspecified abnormalities of gait and mobility: Secondary | ICD-10-CM | POA: Diagnosis not present

## 2013-06-30 DIAGNOSIS — Z9181 History of falling: Secondary | ICD-10-CM | POA: Diagnosis not present

## 2013-06-30 DIAGNOSIS — E119 Type 2 diabetes mellitus without complications: Secondary | ICD-10-CM | POA: Diagnosis not present

## 2013-06-30 DIAGNOSIS — N186 End stage renal disease: Secondary | ICD-10-CM | POA: Diagnosis not present

## 2013-06-30 DIAGNOSIS — R269 Unspecified abnormalities of gait and mobility: Secondary | ICD-10-CM | POA: Diagnosis not present

## 2013-06-30 DIAGNOSIS — IMO0001 Reserved for inherently not codable concepts without codable children: Secondary | ICD-10-CM | POA: Diagnosis not present

## 2013-06-30 DIAGNOSIS — M6281 Muscle weakness (generalized): Secondary | ICD-10-CM | POA: Diagnosis not present

## 2013-07-05 DIAGNOSIS — IMO0001 Reserved for inherently not codable concepts without codable children: Secondary | ICD-10-CM | POA: Diagnosis not present

## 2013-07-05 DIAGNOSIS — Z9181 History of falling: Secondary | ICD-10-CM | POA: Diagnosis not present

## 2013-07-05 DIAGNOSIS — R269 Unspecified abnormalities of gait and mobility: Secondary | ICD-10-CM | POA: Diagnosis not present

## 2013-07-05 DIAGNOSIS — M6281 Muscle weakness (generalized): Secondary | ICD-10-CM | POA: Diagnosis not present

## 2013-07-05 DIAGNOSIS — N186 End stage renal disease: Secondary | ICD-10-CM | POA: Diagnosis not present

## 2013-07-05 DIAGNOSIS — E119 Type 2 diabetes mellitus without complications: Secondary | ICD-10-CM | POA: Diagnosis not present

## 2013-07-06 DIAGNOSIS — IMO0001 Reserved for inherently not codable concepts without codable children: Secondary | ICD-10-CM | POA: Diagnosis not present

## 2013-07-06 DIAGNOSIS — E1129 Type 2 diabetes mellitus with other diabetic kidney complication: Secondary | ICD-10-CM | POA: Diagnosis not present

## 2013-07-06 DIAGNOSIS — R269 Unspecified abnormalities of gait and mobility: Secondary | ICD-10-CM | POA: Diagnosis not present

## 2013-07-06 DIAGNOSIS — M6281 Muscle weakness (generalized): Secondary | ICD-10-CM | POA: Diagnosis not present

## 2013-07-06 DIAGNOSIS — N186 End stage renal disease: Secondary | ICD-10-CM | POA: Diagnosis not present

## 2013-07-06 DIAGNOSIS — E119 Type 2 diabetes mellitus without complications: Secondary | ICD-10-CM | POA: Diagnosis not present

## 2013-07-06 DIAGNOSIS — M25569 Pain in unspecified knee: Secondary | ICD-10-CM | POA: Diagnosis not present

## 2013-07-06 DIAGNOSIS — Z9181 History of falling: Secondary | ICD-10-CM | POA: Diagnosis not present

## 2013-07-12 DIAGNOSIS — N186 End stage renal disease: Secondary | ICD-10-CM | POA: Diagnosis not present

## 2013-07-12 DIAGNOSIS — Z9181 History of falling: Secondary | ICD-10-CM | POA: Diagnosis not present

## 2013-07-12 DIAGNOSIS — IMO0001 Reserved for inherently not codable concepts without codable children: Secondary | ICD-10-CM | POA: Diagnosis not present

## 2013-07-12 DIAGNOSIS — M6281 Muscle weakness (generalized): Secondary | ICD-10-CM | POA: Diagnosis not present

## 2013-07-12 DIAGNOSIS — E119 Type 2 diabetes mellitus without complications: Secondary | ICD-10-CM | POA: Diagnosis not present

## 2013-07-12 DIAGNOSIS — R269 Unspecified abnormalities of gait and mobility: Secondary | ICD-10-CM | POA: Diagnosis not present

## 2013-07-13 DIAGNOSIS — N186 End stage renal disease: Secondary | ICD-10-CM | POA: Diagnosis not present

## 2013-07-14 DIAGNOSIS — N186 End stage renal disease: Secondary | ICD-10-CM | POA: Diagnosis not present

## 2013-07-14 DIAGNOSIS — R269 Unspecified abnormalities of gait and mobility: Secondary | ICD-10-CM | POA: Diagnosis not present

## 2013-07-14 DIAGNOSIS — E119 Type 2 diabetes mellitus without complications: Secondary | ICD-10-CM | POA: Diagnosis not present

## 2013-07-14 DIAGNOSIS — IMO0001 Reserved for inherently not codable concepts without codable children: Secondary | ICD-10-CM | POA: Diagnosis not present

## 2013-07-14 DIAGNOSIS — Z9181 History of falling: Secondary | ICD-10-CM | POA: Diagnosis not present

## 2013-07-14 DIAGNOSIS — M6281 Muscle weakness (generalized): Secondary | ICD-10-CM | POA: Diagnosis not present

## 2013-07-15 DIAGNOSIS — D509 Iron deficiency anemia, unspecified: Secondary | ICD-10-CM | POA: Diagnosis not present

## 2013-07-15 DIAGNOSIS — N2581 Secondary hyperparathyroidism of renal origin: Secondary | ICD-10-CM | POA: Diagnosis not present

## 2013-07-15 DIAGNOSIS — D631 Anemia in chronic kidney disease: Secondary | ICD-10-CM | POA: Diagnosis not present

## 2013-07-15 DIAGNOSIS — N186 End stage renal disease: Secondary | ICD-10-CM | POA: Diagnosis not present

## 2013-07-15 DIAGNOSIS — N039 Chronic nephritic syndrome with unspecified morphologic changes: Secondary | ICD-10-CM | POA: Diagnosis not present

## 2013-07-15 DIAGNOSIS — E1139 Type 2 diabetes mellitus with other diabetic ophthalmic complication: Secondary | ICD-10-CM | POA: Diagnosis not present

## 2013-07-19 DIAGNOSIS — IMO0001 Reserved for inherently not codable concepts without codable children: Secondary | ICD-10-CM | POA: Diagnosis not present

## 2013-07-19 DIAGNOSIS — M6281 Muscle weakness (generalized): Secondary | ICD-10-CM | POA: Diagnosis not present

## 2013-07-19 DIAGNOSIS — E119 Type 2 diabetes mellitus without complications: Secondary | ICD-10-CM | POA: Diagnosis not present

## 2013-07-19 DIAGNOSIS — Z9181 History of falling: Secondary | ICD-10-CM | POA: Diagnosis not present

## 2013-07-19 DIAGNOSIS — R269 Unspecified abnormalities of gait and mobility: Secondary | ICD-10-CM | POA: Diagnosis not present

## 2013-07-19 DIAGNOSIS — N186 End stage renal disease: Secondary | ICD-10-CM | POA: Diagnosis not present

## 2013-07-20 DIAGNOSIS — R269 Unspecified abnormalities of gait and mobility: Secondary | ICD-10-CM | POA: Diagnosis not present

## 2013-07-20 DIAGNOSIS — M6281 Muscle weakness (generalized): Secondary | ICD-10-CM | POA: Diagnosis not present

## 2013-07-20 DIAGNOSIS — N186 End stage renal disease: Secondary | ICD-10-CM | POA: Diagnosis not present

## 2013-07-20 DIAGNOSIS — IMO0001 Reserved for inherently not codable concepts without codable children: Secondary | ICD-10-CM | POA: Diagnosis not present

## 2013-07-20 DIAGNOSIS — E119 Type 2 diabetes mellitus without complications: Secondary | ICD-10-CM | POA: Diagnosis not present

## 2013-07-20 DIAGNOSIS — Z9181 History of falling: Secondary | ICD-10-CM | POA: Diagnosis not present

## 2013-08-13 DIAGNOSIS — N186 End stage renal disease: Secondary | ICD-10-CM | POA: Diagnosis not present

## 2013-08-15 ENCOUNTER — Encounter: Payer: Self-pay | Admitting: Internal Medicine

## 2013-08-15 DIAGNOSIS — D509 Iron deficiency anemia, unspecified: Secondary | ICD-10-CM | POA: Diagnosis not present

## 2013-08-15 DIAGNOSIS — D631 Anemia in chronic kidney disease: Secondary | ICD-10-CM | POA: Diagnosis not present

## 2013-08-15 DIAGNOSIS — N2581 Secondary hyperparathyroidism of renal origin: Secondary | ICD-10-CM | POA: Diagnosis not present

## 2013-08-15 DIAGNOSIS — N186 End stage renal disease: Secondary | ICD-10-CM | POA: Diagnosis not present

## 2013-08-15 DIAGNOSIS — E1139 Type 2 diabetes mellitus with other diabetic ophthalmic complication: Secondary | ICD-10-CM | POA: Diagnosis not present

## 2013-08-16 DIAGNOSIS — N186 End stage renal disease: Secondary | ICD-10-CM | POA: Diagnosis not present

## 2013-08-16 DIAGNOSIS — I871 Compression of vein: Secondary | ICD-10-CM | POA: Diagnosis not present

## 2013-08-16 DIAGNOSIS — T82898A Other specified complication of vascular prosthetic devices, implants and grafts, initial encounter: Secondary | ICD-10-CM | POA: Diagnosis not present

## 2013-08-22 ENCOUNTER — Ambulatory Visit (INDEPENDENT_AMBULATORY_CARE_PROVIDER_SITE_OTHER): Payer: Medicare Other | Admitting: Podiatrist

## 2013-08-22 ENCOUNTER — Encounter: Payer: Self-pay | Admitting: Podiatrist

## 2013-08-22 VITALS — BP 166/97 | HR 73 | Resp 18

## 2013-08-22 DIAGNOSIS — M79609 Pain in unspecified limb: Secondary | ICD-10-CM

## 2013-08-22 DIAGNOSIS — B351 Tinea unguium: Secondary | ICD-10-CM

## 2013-08-22 NOTE — Progress Notes (Signed)
I am here to get my toenails trimmed up  HPI:  Patient presents today for follow up of foot and nail care. Denies any new complaints today.  Objective:  Patients chart is reviewed.   Patients nails are thickened, discolored, distrophic, friable and brittle with yellow-brown discoloration. Patient subjectively relates they are painful with shoes and with ambulation of bilateral feet.  Assessment:  Symptomatic onychomycosis  Plan:  Discussed treatment options and alternatives.  The symptomatic toenails were debrided through manual an mechanical means without complication.  Return appointment recommended at routine intervals of 3 months    Trudie Buckler, DPM

## 2013-08-24 ENCOUNTER — Encounter: Payer: Self-pay | Admitting: Vascular Surgery

## 2013-08-25 ENCOUNTER — Encounter: Payer: Self-pay | Admitting: Vascular Surgery

## 2013-08-25 ENCOUNTER — Ambulatory Visit (INDEPENDENT_AMBULATORY_CARE_PROVIDER_SITE_OTHER): Payer: Medicare Other | Admitting: Vascular Surgery

## 2013-08-25 ENCOUNTER — Ambulatory Visit (HOSPITAL_COMMUNITY)
Admission: RE | Admit: 2013-08-25 | Discharge: 2013-08-25 | Disposition: A | Discharge status: Home / Self Care | Payer: Medicare Other | Source: Ambulatory Visit | Attending: Vascular Surgery | Admitting: Vascular Surgery

## 2013-08-25 VITALS — BP 180/71 | HR 68 | Resp 18 | Ht 64.0 in | Wt 212.0 lb

## 2013-08-25 DIAGNOSIS — N186 End stage renal disease: Secondary | ICD-10-CM | POA: Insufficient documentation

## 2013-08-25 DIAGNOSIS — Z4931 Encounter for adequacy testing for hemodialysis: Secondary | ICD-10-CM | POA: Insufficient documentation

## 2013-08-25 NOTE — Progress Notes (Signed)
    Established Dialysis Access  History of Present Illness  Stefanie [name] is a 68 y.o. (04/20/1945) female who presents for re-evaluation of Left thigh AVG.  Pt previous had a L thigh AVG shuntogram with L FV and L EIV PTA (05/29/13).  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  The patient notes she recently had the thigh AVG intervened upon but she has no records with her.  The patient denies any flow rate problems currently.  The patient's PMH, PSH, SH, FamHx, Med, and Allergies are unchanged from 05/29/13.  On ROS today: +productive cough, no bleeding complications  Physical Examination  Filed Vitals:   08/25/13 1143  BP: 180/71  Pulse: 68  Resp: 18  Height: 5\' 4"  (1.626 m)  Weight: 212 lb (96.163 kg)   Body mass index is 36.37 kg/(m^2).  General: A&O x 3, WD, ill appearing  Pulmonary: Sym exp, good air movt, CTAB, no rales, rhonchi, & wheezing  Cardiac: RRR, Nl S1, S2, no Murmurs, rubs or gallops  Vascular: Vessel Right Left  Radial Not Palpable Not palpable  Ulnar Not Palpable Not Palpable  Brachial Faintly palpable Faintly Palpable   Gastrointestinal: soft, NTND, -G/R, - HSM, - masses, - CVAT B  Musculoskeletal: M/S 5/5 throughout , Extremities without  ischemic changes , + palpable thrill in access in L thigh, pulsatile graft, + bruit in access  Neurologic: Pain and light touch intact in extremities , Motor exam as listed above  Non-Invasive Vascular Imaging  left thigh Access Duplex  (Date: 08/25/2013):   Patent graft   With complex avascular collection near venous anastomosi  Medical Decision Making  Stefanie [name] is a 68 y.o. female who presents with ESRD requiring hemodialysis, recurrent venous stenosis L thigh AVG   As expected the patient has recurrence in her venous stenosis in the L thigh AVG, she recently had intervention by Int. Nephrology per patient.  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  There is no surgical revision possible in this patient as the EIV deep in the pelvis is involved.  I would continue with percutaneous intervention as needed.  Available as needed.  Adele Barthel, MD [facility] [place] Office: [phone] Pager: [phone]  08/25/2013, 12:26 PM

## 2013-09-08 DIAGNOSIS — R0602 Shortness of breath: Secondary | ICD-10-CM | POA: Diagnosis not present

## 2013-09-08 DIAGNOSIS — R918 Other nonspecific abnormal finding of lung field: Secondary | ICD-10-CM | POA: Diagnosis not present

## 2013-09-08 DIAGNOSIS — R05 Cough: Secondary | ICD-10-CM | POA: Diagnosis not present

## 2013-09-08 DIAGNOSIS — I517 Cardiomegaly: Secondary | ICD-10-CM | POA: Diagnosis not present

## 2013-09-08 DIAGNOSIS — R059 Cough, unspecified: Secondary | ICD-10-CM | POA: Diagnosis not present

## 2013-09-12 DIAGNOSIS — N186 End stage renal disease: Secondary | ICD-10-CM | POA: Diagnosis not present

## 2013-09-14 DIAGNOSIS — E1139 Type 2 diabetes mellitus with other diabetic ophthalmic complication: Secondary | ICD-10-CM | POA: Diagnosis not present

## 2013-09-14 DIAGNOSIS — D509 Iron deficiency anemia, unspecified: Secondary | ICD-10-CM | POA: Diagnosis not present

## 2013-09-14 DIAGNOSIS — N2581 Secondary hyperparathyroidism of renal origin: Secondary | ICD-10-CM | POA: Diagnosis not present

## 2013-09-14 DIAGNOSIS — D631 Anemia in chronic kidney disease: Secondary | ICD-10-CM | POA: Diagnosis not present

## 2013-09-14 DIAGNOSIS — N186 End stage renal disease: Secondary | ICD-10-CM | POA: Diagnosis not present

## 2013-09-20 DIAGNOSIS — E1129 Type 2 diabetes mellitus with other diabetic kidney complication: Secondary | ICD-10-CM | POA: Diagnosis not present

## 2013-09-20 DIAGNOSIS — E1169 Type 2 diabetes mellitus with other specified complication: Secondary | ICD-10-CM | POA: Diagnosis not present

## 2013-09-20 DIAGNOSIS — E1142 Type 2 diabetes mellitus with diabetic polyneuropathy: Secondary | ICD-10-CM | POA: Diagnosis not present

## 2013-09-20 DIAGNOSIS — R609 Edema, unspecified: Secondary | ICD-10-CM | POA: Diagnosis not present

## 2013-09-20 DIAGNOSIS — N186 End stage renal disease: Secondary | ICD-10-CM | POA: Diagnosis not present

## 2013-09-20 DIAGNOSIS — E1149 Type 2 diabetes mellitus with other diabetic neurological complication: Secondary | ICD-10-CM | POA: Diagnosis not present

## 2013-10-05 DIAGNOSIS — E1129 Type 2 diabetes mellitus with other diabetic kidney complication: Secondary | ICD-10-CM | POA: Diagnosis not present

## 2013-10-13 DIAGNOSIS — N186 End stage renal disease: Secondary | ICD-10-CM | POA: Diagnosis not present

## 2013-10-14 DIAGNOSIS — D509 Iron deficiency anemia, unspecified: Secondary | ICD-10-CM | POA: Diagnosis not present

## 2013-10-14 DIAGNOSIS — N186 End stage renal disease: Secondary | ICD-10-CM | POA: Diagnosis not present

## 2013-10-14 DIAGNOSIS — D631 Anemia in chronic kidney disease: Secondary | ICD-10-CM | POA: Diagnosis not present

## 2013-10-14 DIAGNOSIS — N2581 Secondary hyperparathyroidism of renal origin: Secondary | ICD-10-CM | POA: Diagnosis not present

## 2013-10-14 DIAGNOSIS — E1139 Type 2 diabetes mellitus with other diabetic ophthalmic complication: Secondary | ICD-10-CM | POA: Diagnosis not present

## 2013-11-01 DIAGNOSIS — H35019 Changes in retinal vascular appearance, unspecified eye: Secondary | ICD-10-CM | POA: Diagnosis not present

## 2013-11-01 DIAGNOSIS — Z9849 Cataract extraction status, unspecified eye: Secondary | ICD-10-CM | POA: Diagnosis not present

## 2013-11-01 DIAGNOSIS — Z961 Presence of intraocular lens: Secondary | ICD-10-CM | POA: Diagnosis not present

## 2013-11-01 DIAGNOSIS — E119 Type 2 diabetes mellitus without complications: Secondary | ICD-10-CM | POA: Diagnosis not present

## 2013-11-01 DIAGNOSIS — E11319 Type 2 diabetes mellitus with unspecified diabetic retinopathy without macular edema: Secondary | ICD-10-CM | POA: Diagnosis not present

## 2013-11-13 DIAGNOSIS — N186 End stage renal disease: Secondary | ICD-10-CM | POA: Diagnosis not present

## 2013-11-14 DIAGNOSIS — D509 Iron deficiency anemia, unspecified: Secondary | ICD-10-CM | POA: Diagnosis not present

## 2013-11-14 DIAGNOSIS — N186 End stage renal disease: Secondary | ICD-10-CM | POA: Diagnosis not present

## 2013-11-14 DIAGNOSIS — E1129 Type 2 diabetes mellitus with other diabetic kidney complication: Secondary | ICD-10-CM | POA: Diagnosis not present

## 2013-11-14 DIAGNOSIS — D631 Anemia in chronic kidney disease: Secondary | ICD-10-CM | POA: Diagnosis not present

## 2013-11-14 DIAGNOSIS — E1139 Type 2 diabetes mellitus with other diabetic ophthalmic complication: Secondary | ICD-10-CM | POA: Diagnosis not present

## 2013-11-14 DIAGNOSIS — N2581 Secondary hyperparathyroidism of renal origin: Secondary | ICD-10-CM | POA: Diagnosis not present

## 2013-11-21 ENCOUNTER — Encounter: Payer: Self-pay | Admitting: Podiatrist

## 2013-11-21 ENCOUNTER — Ambulatory Visit (INDEPENDENT_AMBULATORY_CARE_PROVIDER_SITE_OTHER): Payer: Medicare Other | Admitting: Podiatrist

## 2013-11-21 VITALS — BP 131/74 | HR 81 | Resp 18

## 2013-11-21 DIAGNOSIS — M79609 Pain in unspecified limb: Secondary | ICD-10-CM

## 2013-11-21 DIAGNOSIS — B351 Tinea unguium: Secondary | ICD-10-CM | POA: Diagnosis not present

## 2013-11-21 DIAGNOSIS — M79676 Pain in unspecified toe(s): Principal | ICD-10-CM

## 2013-11-21 NOTE — Progress Notes (Signed)
HPI:  Patient presents today for follow up of foot and nail care. Denies any new complaints today.  Objective:  Patients chart is reviewed.  Vascular status reveals pedal pulses noted at 1 out of 4 dp and pt bilateral .  Neurological sensation is intact to [employer] monofilament bilateral.  Patients nails are thickened, discolored, distrophic, friable and brittle with yellow-brown discoloration. Patient subjectively relates they are painful with shoes and with ambulation of bilateral feet.Xerotic skin present feet and ankles bilateral.   Assessment:  Symptomatic onychomycosis  Plan:  Discussed treatment options and alternatives.  The symptomatic toenails were debrided through manual an mechanical means without complication.  Return appointment recommended at routine intervals of 3 months

## 2013-12-07 DIAGNOSIS — E11359 Type 2 diabetes mellitus with proliferative diabetic retinopathy without macular edema: Secondary | ICD-10-CM | POA: Diagnosis not present

## 2013-12-07 DIAGNOSIS — E1039 Type 1 diabetes mellitus with other diabetic ophthalmic complication: Secondary | ICD-10-CM | POA: Diagnosis not present

## 2013-12-13 DIAGNOSIS — N186 End stage renal disease: Secondary | ICD-10-CM | POA: Diagnosis not present

## 2013-12-14 DIAGNOSIS — E1129 Type 2 diabetes mellitus with other diabetic kidney complication: Secondary | ICD-10-CM | POA: Diagnosis not present

## 2013-12-14 DIAGNOSIS — D631 Anemia in chronic kidney disease: Secondary | ICD-10-CM | POA: Diagnosis not present

## 2013-12-14 DIAGNOSIS — D509 Iron deficiency anemia, unspecified: Secondary | ICD-10-CM | POA: Diagnosis not present

## 2013-12-14 DIAGNOSIS — Z23 Encounter for immunization: Secondary | ICD-10-CM | POA: Diagnosis not present

## 2013-12-14 DIAGNOSIS — N2581 Secondary hyperparathyroidism of renal origin: Secondary | ICD-10-CM | POA: Diagnosis not present

## 2013-12-14 DIAGNOSIS — N186 End stage renal disease: Secondary | ICD-10-CM | POA: Diagnosis not present

## 2013-12-20 DIAGNOSIS — M1711 Unilateral primary osteoarthritis, right knee: Secondary | ICD-10-CM | POA: Diagnosis not present

## 2013-12-26 DIAGNOSIS — M1711 Unilateral primary osteoarthritis, right knee: Secondary | ICD-10-CM | POA: Diagnosis not present

## 2014-01-02 DIAGNOSIS — M1711 Unilateral primary osteoarthritis, right knee: Secondary | ICD-10-CM | POA: Diagnosis not present

## 2014-01-09 DIAGNOSIS — M1711 Unilateral primary osteoarthritis, right knee: Secondary | ICD-10-CM | POA: Diagnosis not present

## 2014-01-11 DIAGNOSIS — E1129 Type 2 diabetes mellitus with other diabetic kidney complication: Secondary | ICD-10-CM | POA: Diagnosis not present

## 2014-01-13 DIAGNOSIS — Z992 Dependence on renal dialysis: Secondary | ICD-10-CM | POA: Diagnosis not present

## 2014-01-13 DIAGNOSIS — N186 End stage renal disease: Secondary | ICD-10-CM | POA: Diagnosis not present

## 2014-01-16 DIAGNOSIS — N2581 Secondary hyperparathyroidism of renal origin: Secondary | ICD-10-CM | POA: Diagnosis not present

## 2014-01-16 DIAGNOSIS — N186 End stage renal disease: Secondary | ICD-10-CM | POA: Diagnosis not present

## 2014-01-16 DIAGNOSIS — D631 Anemia in chronic kidney disease: Secondary | ICD-10-CM | POA: Diagnosis not present

## 2014-01-16 DIAGNOSIS — D509 Iron deficiency anemia, unspecified: Secondary | ICD-10-CM | POA: Diagnosis not present

## 2014-01-18 DIAGNOSIS — N189 Chronic kidney disease, unspecified: Secondary | ICD-10-CM | POA: Diagnosis not present

## 2014-01-18 DIAGNOSIS — I10 Essential (primary) hypertension: Secondary | ICD-10-CM | POA: Diagnosis not present

## 2014-01-18 DIAGNOSIS — E1140 Type 2 diabetes mellitus with diabetic neuropathy, unspecified: Secondary | ICD-10-CM | POA: Diagnosis not present

## 2014-01-18 DIAGNOSIS — D649 Anemia, unspecified: Secondary | ICD-10-CM | POA: Diagnosis not present

## 2014-01-18 DIAGNOSIS — E785 Hyperlipidemia, unspecified: Secondary | ICD-10-CM | POA: Diagnosis not present

## 2014-01-18 DIAGNOSIS — Z79899 Other long term (current) drug therapy: Secondary | ICD-10-CM | POA: Diagnosis not present

## 2014-01-18 DIAGNOSIS — E161 Other hypoglycemia: Secondary | ICD-10-CM | POA: Diagnosis not present

## 2014-02-12 DIAGNOSIS — Z992 Dependence on renal dialysis: Secondary | ICD-10-CM | POA: Diagnosis not present

## 2014-02-12 DIAGNOSIS — N186 End stage renal disease: Secondary | ICD-10-CM | POA: Diagnosis not present

## 2014-02-13 DIAGNOSIS — N186 End stage renal disease: Secondary | ICD-10-CM | POA: Diagnosis not present

## 2014-02-13 DIAGNOSIS — N2581 Secondary hyperparathyroidism of renal origin: Secondary | ICD-10-CM | POA: Diagnosis not present

## 2014-02-13 DIAGNOSIS — D631 Anemia in chronic kidney disease: Secondary | ICD-10-CM | POA: Diagnosis not present

## 2014-02-13 DIAGNOSIS — E1129 Type 2 diabetes mellitus with other diabetic kidney complication: Secondary | ICD-10-CM | POA: Diagnosis not present

## 2014-02-15 ENCOUNTER — Telehealth: Payer: Self-pay

## 2014-02-15 NOTE — Telephone Encounter (Signed)
Daughter called to report pt. has had increased swelling of the left thigh; has left thigh AVGG.  Reported that the pt. Is having difficulty walking due to the amt. of swelling.  Daughter unsure of any recent problems with dialyzing through the left thigh graft.  Also unsure of any redness or warmth in the left thigh AVG site.  [place]; spoke with Caryl Pina, RN.  She confirmed that the swelling of the left thigh has worsened, but stated there hasn't been an recent infiltration/ problems during treatment.  Discussed with Dr. Oneida Alar.  Recommended an office visit only; stated no duplex is needed at this time.

## 2014-02-19 DIAGNOSIS — M8580 Other specified disorders of bone density and structure, unspecified site: Secondary | ICD-10-CM | POA: Diagnosis not present

## 2014-02-19 DIAGNOSIS — Z780 Asymptomatic menopausal state: Secondary | ICD-10-CM | POA: Diagnosis not present

## 2014-02-19 DIAGNOSIS — Z13820 Encounter for screening for osteoporosis: Secondary | ICD-10-CM | POA: Diagnosis not present

## 2014-02-19 DIAGNOSIS — Z1231 Encounter for screening mammogram for malignant neoplasm of breast: Secondary | ICD-10-CM | POA: Diagnosis not present

## 2014-02-20 DIAGNOSIS — M1711 Unilateral primary osteoarthritis, right knee: Secondary | ICD-10-CM | POA: Diagnosis not present

## 2014-02-22 ENCOUNTER — Encounter (HOSPITAL_COMMUNITY): Payer: Self-pay | Admitting: Vascular Surgery

## 2014-02-22 ENCOUNTER — Encounter: Payer: Self-pay | Admitting: Vascular Surgery

## 2014-02-22 DIAGNOSIS — N189 Chronic kidney disease, unspecified: Secondary | ICD-10-CM | POA: Diagnosis not present

## 2014-02-22 DIAGNOSIS — E785 Hyperlipidemia, unspecified: Secondary | ICD-10-CM | POA: Diagnosis not present

## 2014-02-22 DIAGNOSIS — E1140 Type 2 diabetes mellitus with diabetic neuropathy, unspecified: Secondary | ICD-10-CM | POA: Diagnosis not present

## 2014-02-22 DIAGNOSIS — I10 Essential (primary) hypertension: Secondary | ICD-10-CM | POA: Diagnosis not present

## 2014-02-23 ENCOUNTER — Other Ambulatory Visit: Payer: Self-pay

## 2014-02-23 ENCOUNTER — Ambulatory Visit: Payer: Medicare Other

## 2014-02-23 ENCOUNTER — Ambulatory Visit (INDEPENDENT_AMBULATORY_CARE_PROVIDER_SITE_OTHER): Payer: Medicare Other | Admitting: Vascular Surgery

## 2014-02-23 ENCOUNTER — Encounter: Payer: Self-pay | Admitting: Vascular Surgery

## 2014-02-23 VITALS — BP 167/72 | HR 71 | Ht 64.0 in | Wt 195.0 lb

## 2014-02-23 DIAGNOSIS — Z992 Dependence on renal dialysis: Secondary | ICD-10-CM | POA: Diagnosis not present

## 2014-02-23 DIAGNOSIS — N186 End stage renal disease: Secondary | ICD-10-CM | POA: Diagnosis not present

## 2014-02-23 NOTE — Progress Notes (Signed)
Established Dialysis Access  History of Present Illness  Stefanie [name] is a 68 y.o. (Aug 09, 1945) female who presents for re-evaluation of L thigh AVG.  This patient has undergone multiple percutaneous interventions on L thigh AVG.  I previous angioplastied an occluded L EIV on that side.  The patient continues to dialyze from her L thigh AVG reported without any flow problems.  Her left leg is welling  Past Medical History  Diagnosis Date  . Hypertension   . Hyperlipidemia   . Diabetes mellitus   . CVA (cerebral vascular accident) [place]    left sided weakness  . Protein malnutrition   . Secondary hyperparathyroidism   . Anemia   . End stage renal disease     initiated HD 2006  . Hx of echocardiogram     a. Echo 03/2010: EF 55-60%, Gr 1 diast dysfn, trivial MS, mild to mod LAE, PASP 34, ;  b. Echo 11/13: mild LVH, EF 60%, Gr 1 diast dysfn, Ao sclerosis, no AS, MAC, slight MS, mean 3 mmHg, PASP 34  . Hx of cardiovascular stress test     a. MV 03/2010:  no ischemia, EF 55%  . CHF (congestive heart failure)   . Retinal detachment, bilateral   . CAD (coronary artery disease)     Past Surgical History  Procedure Laterality Date  . Cataract extraction    . Tubal ligation    . Arteriovenous graft placement      BUE numerous times  . Arteriovenous graft placement  11/13/10    Lt femoral - Dr. Kellie Simmering  . Dg av dialysis graft declot or  06/06/11, 08/26/11    performed in IR  . Shuntogram N/A 05/29/2013    Procedure: Earney Mallet;  Surgeon: Conrad [place], MD;  Location: [facility] CATH LAB;  Service: Cardiovascular;  Laterality: N/A;    History   Social History  . Marital Status: Single    Spouse Name: N/A    Number of Children: 5  . Years of Education: N/A   Occupational History  .     Social History Main Topics  . Smoking status: Former Smoker    Quit date: 03/17/1983  . Smokeless tobacco: Never Used  . Alcohol Use: No  . Drug Use: No  . Sexual Activity: Not on file    Other Topics Concern  . Not on file   Social History Narrative    Family History  Problem Relation Age of Onset  . Cardiomyopathy    . Heart disease Mother   . Diabetes Mother   . Hypertension Mother   . Heart attack Mother   . Diabetes Other   . Kidney disease Other   . Diabetes Daughter   . Diabetes Son   . Hypertension Son       Current Outpatient Prescriptions on File Prior to Visit  Medication Sig Dispense Refill  . calcium acetate (ELIPHOS) 667 MG tablet Take 667 mg by mouth 3 (three) times daily with meals. Take 3 capsule in the morning, 2 capsules with a snack and 3 capsules in the evening.    . cinacalcet (SENSIPAR) 30 MG tablet Take 30 mg by mouth at bedtime.    . folic acid-vitamin b complex-vitamin c-selenium-zinc (DIALYVITE) 3 MG TABS tablet Take 1 tablet by mouth daily.    Marland Kitchen gabapentin (NEURONTIN) 300 MG capsule Take 300 mg by mouth at bedtime.    . Insulin Lispro Prot & Lispro (HUMALOG MIX 75/25 PEN [place]) Inject  10-14 Units into the skin 2 (two) times daily.     No current facility-administered medications on file prior to visit.    No Known Allergies  REVIEW OF SYSTEMS:  (Positives checked otherwise negative)  CARDIOVASCULAR:  []  chest pain, []  chest pressure, []  palpitations, []  shortness of breath when laying flat, []  shortness of breath with exertion,  []  pain in feet when walking, []  pain in feet when laying flat, []  history of blood clot in veins (DVT), []  history of phlebitis, []  swelling in legs, []  varicose veins  PULMONARY:  []  productive cough, []  asthma, []  wheezing  NEUROLOGIC:  []  weakness in arms or legs, []  numbness in arms or legs, []  difficulty speaking or slurred speech, []  temporary loss of vision in one eye, []  dizziness  HEMATOLOGIC:  []  bleeding problems, []  problems with blood clotting too easily  MUSCULOSKEL:  []  joint pain, []  joint swelling  GASTROINTEST:  []  vomiting blood, []  blood in stool     GENITOURINARY:  []  burning  with urination, []  blood in urine, [x]  ESRD-Hd: T-R-S  PSYCHIATRIC:  []  history of major depression  INTEGUMENTARY:  []  rashes, []  ulcers  Physical Examination  Filed Vitals:   02/23/14 1045  BP: 167/72  Pulse: 71  Height: 5\' 4"  (1.626 m)  Weight: 195 lb (88.451 kg)  SpO2: 100%   Body mass index is 33.46 kg/(m^2).  General: A&O x 3, WD, mildly obese  Pulmonary: Sym exp, good air movt, CTAB, no rales, rhonchi, & wheezing  Cardiac: RRR, Nl S1, S2, no Murmurs, rubs or gallops  Vascular: pulsatile L thigh AVG with faintly bruit   Medical Decision Making  Stefanie [name] is a 68 y.o. female who presents with ESRD requiring hemodialysis with recurrent outflow stenosis in L thigh AVG  I recommend proceeding with a L thigh shuntogram, possible intervention including stenting. I discussed with the patient the nature of angiographic procedures, especially the limited patencies of any endovascular intervention.  The patient is aware of that the risks of an angiographic procedure include but are not limited to: bleeding, infection, access site complications, renal failure, embolization, rupture of vessel, dissection, possible need for emergent surgical intervention, possible need for surgical procedures to treat the patient's pathology, anaphylactic reaction to contrast, and stroke and death.   The patient is aware of the risks and agrees to proceed.  The patient has agreed to proceed with the above procedure which will be scheduled 14 DEC 15.  Adele Barthel, MD [facility] [place] Office: [phone] Pager: [phone]  02/23/2014, 11:54 AM

## 2014-02-28 DIAGNOSIS — E119 Type 2 diabetes mellitus without complications: Secondary | ICD-10-CM | POA: Diagnosis not present

## 2014-02-28 DIAGNOSIS — Z8673 Personal history of transient ischemic attack (TIA), and cerebral infarction without residual deficits: Secondary | ICD-10-CM | POA: Diagnosis not present

## 2014-02-28 DIAGNOSIS — M1990 Unspecified osteoarthritis, unspecified site: Secondary | ICD-10-CM | POA: Diagnosis not present

## 2014-02-28 DIAGNOSIS — Z794 Long term (current) use of insulin: Secondary | ICD-10-CM | POA: Diagnosis not present

## 2014-02-28 DIAGNOSIS — Z992 Dependence on renal dialysis: Secondary | ICD-10-CM | POA: Diagnosis not present

## 2014-02-28 DIAGNOSIS — Z9181 History of falling: Secondary | ICD-10-CM | POA: Diagnosis not present

## 2014-02-28 DIAGNOSIS — I509 Heart failure, unspecified: Secondary | ICD-10-CM | POA: Diagnosis not present

## 2014-02-28 DIAGNOSIS — N186 End stage renal disease: Secondary | ICD-10-CM | POA: Diagnosis not present

## 2014-02-28 DIAGNOSIS — M545 Low back pain: Secondary | ICD-10-CM | POA: Diagnosis not present

## 2014-02-28 DIAGNOSIS — I120 Hypertensive chronic kidney disease with stage 5 chronic kidney disease or end stage renal disease: Secondary | ICD-10-CM | POA: Diagnosis not present

## 2014-02-28 DIAGNOSIS — M6281 Muscle weakness (generalized): Secondary | ICD-10-CM | POA: Diagnosis not present

## 2014-02-28 MED ORDER — SODIUM CHLORIDE 0.9 % IJ SOLN: 3.0000 mL | INTRAMUSCULAR | Status: DC | PRN

## 2014-03-01 ENCOUNTER — Encounter (HOSPITAL_COMMUNITY)
Admission: RE | Disposition: A | Discharge status: Home / Self Care | Payer: Self-pay | Source: Ambulatory Visit | Attending: Vascular Surgery

## 2014-03-01 ENCOUNTER — Encounter (HOSPITAL_COMMUNITY): Payer: Self-pay | Admitting: Vascular Surgery

## 2014-03-01 ENCOUNTER — Ambulatory Visit (HOSPITAL_COMMUNITY)
Admission: RE | Admit: 2014-03-01 | Discharge: 2014-03-01 | Disposition: A | Discharge status: Home / Self Care | Payer: Medicare Other | Source: Ambulatory Visit | Attending: Vascular Surgery | Admitting: Vascular Surgery

## 2014-03-01 DIAGNOSIS — Z992 Dependence on renal dialysis: Secondary | ICD-10-CM

## 2014-03-01 DIAGNOSIS — Z833 Family history of diabetes mellitus: Secondary | ICD-10-CM | POA: Diagnosis not present

## 2014-03-01 DIAGNOSIS — R001 Bradycardia, unspecified: Secondary | ICD-10-CM | POA: Diagnosis not present

## 2014-03-01 DIAGNOSIS — D649 Anemia, unspecified: Secondary | ICD-10-CM | POA: Diagnosis not present

## 2014-03-01 DIAGNOSIS — I82429 Acute embolism and thrombosis of unspecified iliac vein: Secondary | ICD-10-CM | POA: Insufficient documentation

## 2014-03-01 DIAGNOSIS — I2510 Atherosclerotic heart disease of native coronary artery without angina pectoris: Secondary | ICD-10-CM | POA: Diagnosis not present

## 2014-03-01 DIAGNOSIS — E119 Type 2 diabetes mellitus without complications: Secondary | ICD-10-CM | POA: Insufficient documentation

## 2014-03-01 DIAGNOSIS — N2581 Secondary hyperparathyroidism of renal origin: Secondary | ICD-10-CM | POA: Insufficient documentation

## 2014-03-01 DIAGNOSIS — Z79899 Other long term (current) drug therapy: Secondary | ICD-10-CM | POA: Diagnosis not present

## 2014-03-01 DIAGNOSIS — Z8249 Family history of ischemic heart disease and other diseases of the circulatory system: Secondary | ICD-10-CM | POA: Insufficient documentation

## 2014-03-01 DIAGNOSIS — I132 Hypertensive heart and chronic kidney disease with heart failure and with stage 5 chronic kidney disease, or end stage renal disease: Secondary | ICD-10-CM | POA: Insufficient documentation

## 2014-03-01 DIAGNOSIS — Z841 Family history of disorders of kidney and ureter: Secondary | ICD-10-CM | POA: Insufficient documentation

## 2014-03-01 DIAGNOSIS — N186 End stage renal disease: Secondary | ICD-10-CM | POA: Insufficient documentation

## 2014-03-01 DIAGNOSIS — Z87891 Personal history of nicotine dependence: Secondary | ICD-10-CM | POA: Insufficient documentation

## 2014-03-01 DIAGNOSIS — E785 Hyperlipidemia, unspecified: Secondary | ICD-10-CM | POA: Insufficient documentation

## 2014-03-01 DIAGNOSIS — T82858A Stenosis of vascular prosthetic devices, implants and grafts, initial encounter: Secondary | ICD-10-CM | POA: Diagnosis not present

## 2014-03-01 DIAGNOSIS — I509 Heart failure, unspecified: Secondary | ICD-10-CM | POA: Diagnosis not present

## 2014-03-01 DIAGNOSIS — I8290 Acute embolism and thrombosis of unspecified vein: Secondary | ICD-10-CM | POA: Diagnosis present

## 2014-03-01 DIAGNOSIS — M7989 Other specified soft tissue disorders: Secondary | ICD-10-CM | POA: Diagnosis present

## 2014-03-01 HISTORY — PX: SHUNTOGRAM: SHX5491

## 2014-03-01 LAB — POCT I-STAT, CHEM 8
BUN: 41 mg/dL — ABNORMAL HIGH (ref 6–23)
Calcium, Ion: 1.01 mmol/L — ABNORMAL LOW (ref 1.13–1.30)
Chloride: 99 mEq/L (ref 96–112)
Creatinine, Ser: 8.2 mg/dL — ABNORMAL HIGH (ref 0.50–1.10)
GLUCOSE: 152 mg/dL — AB (ref 70–99)
HCT: 38 % (ref 36.0–46.0)
HEMOGLOBIN: 12.9 g/dL (ref 12.0–15.0)
POTASSIUM: 4.8 meq/L (ref 3.7–5.3)
SODIUM: 140 meq/L (ref 137–147)
TCO2: 28 mmol/L (ref 0–100)

## 2014-03-01 SURGERY — ASSESSMENT, SHUNT FUNCTION, WITH CONTRAST RADIOGRAPHIC STUDY
Anesthesia: LOCAL | Laterality: Left

## 2014-03-01 MED ORDER — HEPARIN SODIUM (PORCINE) 1000 UNIT/ML IJ SOLN
INTRAMUSCULAR | Status: AC
  Filled 2014-03-01: qty 1

## 2014-03-01 MED ORDER — CLOPIDOGREL BISULFATE 300 MG PO TABS
300.0000 mg | ORAL_TABLET | Freq: Once | ORAL | Status: AC
  Administered 2014-03-01: 300 mg via ORAL

## 2014-03-01 MED ORDER — LIDOCAINE HCL (PF) 1 % IJ SOLN
INTRAMUSCULAR | Status: AC
  Filled 2014-03-01: qty 30

## 2014-03-01 MED ORDER — SODIUM CHLORIDE 0.9 % IJ SOLN: 3.0000 mL | Freq: Two times a day (BID) | INTRAMUSCULAR | Status: DC

## 2014-03-01 MED ORDER — SODIUM CHLORIDE 0.9 % IJ SOLN: 3.0000 mL | INTRAMUSCULAR | Status: DC | PRN

## 2014-03-01 MED ORDER — HEPARIN (PORCINE) IN NACL 2-0.9 UNIT/ML-% IJ SOLN
INTRAMUSCULAR | Status: AC
  Filled 2014-03-01: qty 1000

## 2014-03-01 MED ORDER — CLOPIDOGREL BISULFATE 300 MG PO TABS
ORAL_TABLET | ORAL | Status: AC
  Filled 2014-03-01: qty 1

## 2014-03-01 MED ORDER — SODIUM CHLORIDE 0.9 % IV SOLN: 250.0000 mL | INTRAVENOUS | Status: DC | PRN

## 2014-03-01 MED ORDER — CLOPIDOGREL BISULFATE 75 MG PO TABS: 75.0000 mg | ORAL_TABLET | Freq: Every day | ORAL | Status: DC

## 2014-03-01 NOTE — Op Note (Signed)
OPERATIVE NOTE   PROCEDURE: 1.  left thigh arteriovenous graft cannulation under ultrasound guidance 2.  left thigh shuntogram 3.  Venoplasty left common femoral and external internal vein (3 mm x 20 mm, 6 mm x 80 mm) 4.  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 Occluded common femoral vein and external iliac vein: resolved with angioplasty and stenting 2. Collateral venous flow via greater saphenous vein and pelvic venous branches  SPECIMEN(S):  None  CONTRAST: 50 cc  INDICATIONS: Stefanie [name] is a 68 y.o. female who presents left leg swelling in the setting of known prior left external iliac vein occlusion.  The patient is scheduled for left thigh shuntogram and possible intervention.  The patient is aware the risks include but are not limited to: bleeding, infection, thrombosis of the cannulated access, and possible anaphylactic reaction to the contrast.  The patient is aware of the risks of the procedure and elects to proceed forward.  DESCRIPTION: After full informed written consent was obtained, the patient was brought back to the angiography suite and placed supine upon the angiography table.  The patient was connected to monitoring equipment.  The left thigh was prepped and draped in the standard fashion for a percutaneous access intervention.  Under ultrasound guidance, the left thigh arteriovenous graft was cannulated with a micropuncture needle.  The microwire was advanced into the fistula and the needle was exchanged for the a microsheath, which was lodged 2 cm into the access.  The wire was removed and the sheath was connected to the IV extension tubing.  Hand injections were completed to image  the access from the venous limb up to inferior vena cava .  Based on the images, this patient will need: attempt at recannulation of the occluded left external iliac vein and common femoral vein segment and likely stenting of the left external iliac vein and common femoral vein.  A Benson wire was advanced into the distal common femoral vein and the sheath was exchanged for a short 6-Fr sheath.  I placed a 4-Fr end-hole catheter over the wire and the wire was exchanged for a Glidewire.  Using this combination, I was able to cross the common femoral vein but not the proximal external iliac vein.  I exchanged the wire for a 0.014" Stabilizer wire and the exchanged the catheter for a 3 mm x 20 mm low profile balloon.  I was able to cross the occlusion with this combination.  I ballooned the entire segment from distal external iliac vein to the common femoral vein.  This allowed me to exchange the balloon for the end-hole catheter.  The wire was exchanged for a long Rosen wire which was placed in the inferior vena cava.  Based on the the imaging, a 6 mm x 80 mm angioplasty balloon was selected.  The balloon was centered around the entire stenotic segment and inflated to 12 atm for 2 minutes.  On completion imaging, a >50% residual stenosis was present.  I felt since this was the second time this external iliac vein had occluded, a stent would need to be placed as next time she might get a rupture of this vein segment during recannulation attempts.  I rotated image intensifer into steep LAO and did a hand injection to identify the anastomosis of the graft onto the vein.  Based on measurements, I selected a 9 mm x 80 mm self-expanding stent.  I deployed the stent but unexpectedly the stent jump proximal.  I pulled the stent back as much as possible but the stent released from its delivery system a few centimeters proximal to previously expected.  I then selected a 8 mm x 60 mm from another manufacturer and deployed this  with 1 cm overlap.  I then fully apposed the wall of these stents with a 9 mm x 60 mm angioplasty balloon.  I did a hand injection which demonstrated complete resolution of the occlusion with natural tapering of the external iliac vein and common femoral vein .  Based on the completion imaging, no further intervention is necessary.  The wire and balloon were removed from the sheath.  A 4-0 Monocryl purse-string suture was sewn around the sheath.  The sheath was removed while tying down the suture.  A sterile bandage was applied to the puncture site.  COMPLICATIONS: none  CONDITION: stable  Adele Barthel, MD [facility] [place] Office: [phone] Pager: [phone]  03/01/2014 11:36 AM

## 2014-03-01 NOTE — H&P (View-Only) (Signed)
Established Dialysis Access  History of Present Illness  Stefanie [name] is a 68 y.o. (March 31, 1945) female who presents for re-evaluation of L thigh AVG.  This patient has undergone multiple percutaneous interventions on L thigh AVG.  I previous angioplastied an occluded L EIV on that side.  The patient continues to dialyze from her L thigh AVG reported without any flow problems.  Her left leg is welling  Past Medical History  Diagnosis Date  . Hypertension   . Hyperlipidemia   . Diabetes mellitus   . CVA (cerebral vascular accident) [place]    left sided weakness  . Protein malnutrition   . Secondary hyperparathyroidism   . Anemia   . End stage renal disease     initiated HD 2006  . Hx of echocardiogram     a. Echo 03/2010: EF 55-60%, Gr 1 diast dysfn, trivial MS, mild to mod LAE, PASP 34, ;  b. Echo 11/13: mild LVH, EF 60%, Gr 1 diast dysfn, Ao sclerosis, no AS, MAC, slight MS, mean 3 mmHg, PASP 34  . Hx of cardiovascular stress test     a. MV 03/2010:  no ischemia, EF 55%  . CHF (congestive heart failure)   . Retinal detachment, bilateral   . CAD (coronary artery disease)     Past Surgical History  Procedure Laterality Date  . Cataract extraction    . Tubal ligation    . Arteriovenous graft placement      BUE numerous times  . Arteriovenous graft placement  11/13/10    Lt femoral - Dr. Kellie Simmering  . Dg av dialysis graft declot or  06/06/11, 08/26/11    performed in IR  . Shuntogram N/A 05/29/2013    Procedure: Earney Mallet;  Surgeon: Conrad Govan, MD;  Location[facility] CATH LAB;  Service: Cardiovascular;  Laterality: N/A;    History   Social History  . Marital Status: Single    Spouse Name: N/A    Number of Children: 5  . Years of Education: N/A   Occupational History  .     Social History Main Topics  . Smoking status: Former Smoker    Quit date: 03/17/1983  . Smokeless tobacco: Never Used  . Alcohol Use: No  . Drug Use: No  . Sexual Activity: Not on file    Other Topics Concern  . Not on file   Social History Narrative    Family History  Problem Relation Age of Onset  . Cardiomyopathy    . Heart disease Mother   . Diabetes Mother   . Hypertension Mother   . Heart attack Mother   . Diabetes Other   . Kidney disease Other   . Diabetes Daughter   . Diabetes Son   . Hypertension Son       Current Outpatient Prescriptions on File Prior to Visit  Medication Sig Dispense Refill  . calcium acetate (ELIPHOS) 667 MG tablet Take 667 mg by mouth 3 (three) times daily with meals. Take 3 capsule in the morning, 2 capsules with a snack and 3 capsules in the evening.    . cinacalcet (SENSIPAR) 30 MG tablet Take 30 mg by mouth at bedtime.    . folic acid-vitamin b complex-vitamin c-selenium-zinc (DIALYVITE) 3 MG TABS tablet Take 1 tablet by mouth daily.    Marland Kitchen gabapentin (NEURONTIN) 300 MG capsule Take 300 mg by mouth at bedtime.    . Insulin Lispro Prot & Lispro (HUMALOG MIX 75/25 PEN [place]) Inject  10-14 Units into the skin 2 (two) times daily.     No current facility-administered medications on file prior to visit.    No Known Allergies  REVIEW OF SYSTEMS:  (Positives checked otherwise negative)  CARDIOVASCULAR:  []  chest pain, []  chest pressure, []  palpitations, []  shortness of breath when laying flat, []  shortness of breath with exertion,  []  pain in feet when walking, []  pain in feet when laying flat, []  history of blood clot in veins (DVT), []  history of phlebitis, []  swelling in legs, []  varicose veins  PULMONARY:  []  productive cough, []  asthma, []  wheezing  NEUROLOGIC:  []  weakness in arms or legs, []  numbness in arms or legs, []  difficulty speaking or slurred speech, []  temporary loss of vision in one eye, []  dizziness  HEMATOLOGIC:  []  bleeding problems, []  problems with blood clotting too easily  MUSCULOSKEL:  []  joint pain, []  joint swelling  GASTROINTEST:  []  vomiting blood, []  blood in stool     GENITOURINARY:  []  burning  with urination, []  blood in urine, [x]  ESRD-Hd: T-R-S  PSYCHIATRIC:  []  history of major depression  INTEGUMENTARY:  []  rashes, []  ulcers  Physical Examination  Filed Vitals:   02/23/14 1045  BP: 167/72  Pulse: 71  Height: 5\' 4"  (1.626 m)  Weight: 195 lb (88.451 kg)  SpO2: 100%   Body mass index is 33.46 kg/(m^2).  General: A&O x 3, WD, mildly obese  Pulmonary: Sym exp, good air movt, CTAB, no rales, rhonchi, & wheezing  Cardiac: RRR, Nl S1, S2, no Murmurs, rubs or gallops  Vascular: pulsatile L thigh AVG with faintly bruit   Medical Decision Making  Stefanie [name] is a 68 y.o. female who presents with ESRD requiring hemodialysis with recurrent outflow stenosis in L thigh AVG  I recommend proceeding with a L thigh shuntogram, possible intervention including stenting. I discussed with the patient the nature of angiographic procedures, especially the limited patencies of any endovascular intervention.  The patient is aware of that the risks of an angiographic procedure include but are not limited to: bleeding, infection, access site complications, renal failure, embolization, rupture of vessel, dissection, possible need for emergent surgical intervention, possible need for surgical procedures to treat the patient's pathology, anaphylactic reaction to contrast, and stroke and death.   The patient is aware of the risks and agrees to proceed.  The patient has agreed to proceed with the above procedure which will be scheduled 14 DEC 15.  Adele Barthel, MD [facility] [place] Office: [phone] Pager: [phone]  02/23/2014, 11:54 AM

## 2014-03-01 NOTE — Interval H&P Note (Signed)
[facility]  History and Physical Update  The patient was interviewed and re-examined.  The patient's previous History and Physical has been reviewed and is unchanged from my consult.  There is no change in the plan of care: L thigh shuntogram, possible intervention. I discussed with the patient the nature of angiographic procedures, especially the limited patencies of any endovascular intervention.  The patient is aware of that the risks of an angiographic procedure include but are not limited to: bleeding, infection, access site complications, renal failure, embolization, rupture of vessel, dissection, possible need for emergent surgical intervention, possible need for surgical procedures to treat the patient's pathology, anaphylactic reaction to contrast, and stroke and death.  The patient is aware of the risks and agrees to proceed. Mallampati 3.  ASA 3    Adele Barthel, MD [facility] Office: [phone] Pager: [phone]  03/01/2014, 7:16 AM

## 2014-03-01 NOTE — Discharge Instructions (Signed)
Call [phone] if any problems,questions,or concerns; call if any bleeding,drainage,fever,pain, swelling, or redness left thigh graft site

## 2014-03-05 DIAGNOSIS — I509 Heart failure, unspecified: Secondary | ICD-10-CM | POA: Diagnosis not present

## 2014-03-05 DIAGNOSIS — E119 Type 2 diabetes mellitus without complications: Secondary | ICD-10-CM | POA: Diagnosis not present

## 2014-03-05 DIAGNOSIS — N186 End stage renal disease: Secondary | ICD-10-CM | POA: Diagnosis not present

## 2014-03-05 DIAGNOSIS — M545 Low back pain: Secondary | ICD-10-CM | POA: Diagnosis not present

## 2014-03-05 DIAGNOSIS — I120 Hypertensive chronic kidney disease with stage 5 chronic kidney disease or end stage renal disease: Secondary | ICD-10-CM | POA: Diagnosis not present

## 2014-03-05 DIAGNOSIS — M6281 Muscle weakness (generalized): Secondary | ICD-10-CM | POA: Diagnosis not present

## 2014-03-07 DIAGNOSIS — M6281 Muscle weakness (generalized): Secondary | ICD-10-CM | POA: Diagnosis not present

## 2014-03-07 DIAGNOSIS — I509 Heart failure, unspecified: Secondary | ICD-10-CM | POA: Diagnosis not present

## 2014-03-07 DIAGNOSIS — N186 End stage renal disease: Secondary | ICD-10-CM | POA: Diagnosis not present

## 2014-03-07 DIAGNOSIS — E119 Type 2 diabetes mellitus without complications: Secondary | ICD-10-CM | POA: Diagnosis not present

## 2014-03-07 DIAGNOSIS — M545 Low back pain: Secondary | ICD-10-CM | POA: Diagnosis not present

## 2014-03-07 DIAGNOSIS — I120 Hypertensive chronic kidney disease with stage 5 chronic kidney disease or end stage renal disease: Secondary | ICD-10-CM | POA: Diagnosis not present

## 2014-03-12 DIAGNOSIS — N186 End stage renal disease: Secondary | ICD-10-CM | POA: Diagnosis not present

## 2014-03-12 DIAGNOSIS — M6281 Muscle weakness (generalized): Secondary | ICD-10-CM | POA: Diagnosis not present

## 2014-03-12 DIAGNOSIS — I509 Heart failure, unspecified: Secondary | ICD-10-CM | POA: Diagnosis not present

## 2014-03-12 DIAGNOSIS — I120 Hypertensive chronic kidney disease with stage 5 chronic kidney disease or end stage renal disease: Secondary | ICD-10-CM | POA: Diagnosis not present

## 2014-03-12 DIAGNOSIS — M545 Low back pain: Secondary | ICD-10-CM | POA: Diagnosis not present

## 2014-03-12 DIAGNOSIS — E119 Type 2 diabetes mellitus without complications: Secondary | ICD-10-CM | POA: Diagnosis not present

## 2014-03-14 DIAGNOSIS — M545 Low back pain: Secondary | ICD-10-CM | POA: Diagnosis not present

## 2014-03-14 DIAGNOSIS — E119 Type 2 diabetes mellitus without complications: Secondary | ICD-10-CM | POA: Diagnosis not present

## 2014-03-14 DIAGNOSIS — I509 Heart failure, unspecified: Secondary | ICD-10-CM | POA: Diagnosis not present

## 2014-03-14 DIAGNOSIS — N186 End stage renal disease: Secondary | ICD-10-CM | POA: Diagnosis not present

## 2014-03-14 DIAGNOSIS — I120 Hypertensive chronic kidney disease with stage 5 chronic kidney disease or end stage renal disease: Secondary | ICD-10-CM | POA: Diagnosis not present

## 2014-03-14 DIAGNOSIS — M6281 Muscle weakness (generalized): Secondary | ICD-10-CM | POA: Diagnosis not present

## 2014-03-15 DIAGNOSIS — Z992 Dependence on renal dialysis: Secondary | ICD-10-CM | POA: Diagnosis not present

## 2014-03-15 DIAGNOSIS — N186 End stage renal disease: Secondary | ICD-10-CM | POA: Diagnosis not present

## 2014-03-17 DIAGNOSIS — D631 Anemia in chronic kidney disease: Secondary | ICD-10-CM | POA: Diagnosis not present

## 2014-03-17 DIAGNOSIS — N2581 Secondary hyperparathyroidism of renal origin: Secondary | ICD-10-CM | POA: Diagnosis not present

## 2014-03-17 DIAGNOSIS — N186 End stage renal disease: Secondary | ICD-10-CM | POA: Diagnosis not present

## 2014-03-17 DIAGNOSIS — E1129 Type 2 diabetes mellitus with other diabetic kidney complication: Secondary | ICD-10-CM | POA: Diagnosis not present

## 2014-03-18 ENCOUNTER — Telehealth: Payer: Self-pay | Admitting: Vascular Surgery

## 2014-03-18 DIAGNOSIS — R609 Edema, unspecified: Secondary | ICD-10-CM | POA: Diagnosis not present

## 2014-03-18 DIAGNOSIS — I10 Essential (primary) hypertension: Secondary | ICD-10-CM | POA: Diagnosis not present

## 2014-03-18 DIAGNOSIS — R531 Weakness: Secondary | ICD-10-CM | POA: Diagnosis not present

## 2014-03-18 DIAGNOSIS — E780 Pure hypercholesterolemia: Secondary | ICD-10-CM | POA: Diagnosis not present

## 2014-03-18 DIAGNOSIS — Z794 Long term (current) use of insulin: Secondary | ICD-10-CM | POA: Diagnosis not present

## 2014-03-18 DIAGNOSIS — M7989 Other specified soft tissue disorders: Secondary | ICD-10-CM | POA: Diagnosis not present

## 2014-03-18 DIAGNOSIS — M79662 Pain in left lower leg: Secondary | ICD-10-CM | POA: Diagnosis not present

## 2014-03-18 DIAGNOSIS — E119 Type 2 diabetes mellitus without complications: Secondary | ICD-10-CM | POA: Diagnosis not present

## 2014-03-18 NOTE — Telephone Encounter (Signed)
Pt seen in [facility] ER today.  Noted to have leg swelling but no perigraft swelling.  No DVT.  Patent graft.  Had PTA of ext iliac vein by Bridgett Larsson about 3 weeks ago.   I suspect pt has recurrent narrowing  Will arrange for outpt office visit this week  Ruta Hinds, MD [facility] Office: [phone] Pager: [phone]

## 2014-03-20 ENCOUNTER — Telehealth: Payer: Self-pay | Admitting: Vascular Surgery

## 2014-03-20 NOTE — Telephone Encounter (Signed)
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

## 2014-03-20 NOTE — Telephone Encounter (Signed)
Spoke with pts daughter, Solmon Ice to schedule, dpm

## 2014-03-22 ENCOUNTER — Encounter: Payer: Self-pay | Admitting: Vascular Surgery

## 2014-03-23 ENCOUNTER — Ambulatory Visit (INDEPENDENT_AMBULATORY_CARE_PROVIDER_SITE_OTHER): Payer: Medicare Other | Admitting: Vascular Surgery

## 2014-03-23 ENCOUNTER — Encounter: Payer: Self-pay | Admitting: Vascular Surgery

## 2014-03-23 VITALS — BP 169/51 | HR 74 | Temp 99.3°F | Ht 64.0 in | Wt 195.0 lb

## 2014-03-23 DIAGNOSIS — I739 Peripheral vascular disease, unspecified: Secondary | ICD-10-CM

## 2014-03-23 DIAGNOSIS — Z992 Dependence on renal dialysis: Secondary | ICD-10-CM | POA: Diagnosis not present

## 2014-03-23 DIAGNOSIS — I120 Hypertensive chronic kidney disease with stage 5 chronic kidney disease or end stage renal disease: Secondary | ICD-10-CM | POA: Diagnosis not present

## 2014-03-23 DIAGNOSIS — N186 End stage renal disease: Secondary | ICD-10-CM

## 2014-03-23 DIAGNOSIS — I871 Compression of vein: Secondary | ICD-10-CM | POA: Diagnosis not present

## 2014-03-23 DIAGNOSIS — M6281 Muscle weakness (generalized): Secondary | ICD-10-CM | POA: Diagnosis not present

## 2014-03-23 DIAGNOSIS — E119 Type 2 diabetes mellitus without complications: Secondary | ICD-10-CM | POA: Diagnosis not present

## 2014-03-23 DIAGNOSIS — I509 Heart failure, unspecified: Secondary | ICD-10-CM | POA: Diagnosis not present

## 2014-03-23 DIAGNOSIS — M545 Low back pain: Secondary | ICD-10-CM | POA: Diagnosis not present

## 2014-03-23 NOTE — Progress Notes (Signed)
    Postoperative Visit   History of Present Illness  Stefanie [name] is a 69 y.o. female who presents for postoperative follow-up from procedure on Date: 03/01/14: recannulation of L CFV/EIV with stenting and angioplasty L CFV/EIV .  The patient has been able to continue HD with difficulties.  She was seen in ED in [facility] recently due to sudden onset of severe R leg pain.  Pt describes it as sharp, severe pain through R leg, which resolved rapidly.  She denies any L leg pain.  Pt denies intermittent claudication and rest pain at this point.    Past Medical History, Past Surgical History, Social History, Family History, Medications, Allergies, and Review of Systems are unchanged from previous evaluation on 03/01/14.  On ROS: no intermittent claudication or rest pain  For VQI Use Only  PRE-ADM LIVING: Home  AMB STATUS: Ambulatory  Physical Examination  Filed Vitals:   03/23/14 1342  BP: 169/51  Pulse: 74  Temp: 99.3 F (37.4 C)  TempSrc: Oral  Height: 5\' 4"  (1.626 m)  Weight: 195 lb (88.451 kg)  SpO2: 97%   Body mass index is 33.46 kg/(m^2).  General: A&O x 3, WDWN  Pulmonary: Sym exp, good air movt, CTAB, no rales, rhonchi, & wheezing  Cardiac: RRR, Nl S1, S2, no Murmurs, rubs or gallops  Vascular: faintly palpable R DP, faintly palpable L PT, palpable thrill in L thigh AVG with good bruit  Gastrointestinal: soft, NTND, -G/R, - HSM, - masses, - CVAT B  Musculoskeletal: M/S 5/5 throughout , Extremities without ischemic changes   Neurologic:  Pain and light touch intact in extremities , Motor exam as listed above  Medical Decision Making  Stefanie [name] is a 69 y.o. female who presents s/p recannulation of occluded L CFV/EIV, PTA+S L CFV/EIV, known history of PAD . I'm not certain the etiology of this patient's recent R leg pain sx, but given prior history of known PAD in both legs, I would repeat the ABI. The left thigh AVG is functioning well.  At some  point, further venoplasty if not additional stenting will be needed.  Every attempt to salvage this access should be made given this patient's limited remaining options.  Thank you for allowing [place] to participate in this patient's care.  Adele Barthel, MD [facility] [place] Office: [phone] Pager: [phone]

## 2014-03-30 DIAGNOSIS — N186 End stage renal disease: Secondary | ICD-10-CM | POA: Diagnosis not present

## 2014-03-30 DIAGNOSIS — E119 Type 2 diabetes mellitus without complications: Secondary | ICD-10-CM | POA: Diagnosis not present

## 2014-03-30 DIAGNOSIS — M545 Low back pain: Secondary | ICD-10-CM | POA: Diagnosis not present

## 2014-03-30 DIAGNOSIS — I509 Heart failure, unspecified: Secondary | ICD-10-CM | POA: Diagnosis not present

## 2014-03-30 DIAGNOSIS — M6281 Muscle weakness (generalized): Secondary | ICD-10-CM | POA: Diagnosis not present

## 2014-03-30 DIAGNOSIS — I120 Hypertensive chronic kidney disease with stage 5 chronic kidney disease or end stage renal disease: Secondary | ICD-10-CM | POA: Diagnosis not present

## 2014-04-02 ENCOUNTER — Ambulatory Visit: Payer: Medicare Other

## 2014-04-02 DIAGNOSIS — I120 Hypertensive chronic kidney disease with stage 5 chronic kidney disease or end stage renal disease: Secondary | ICD-10-CM | POA: Diagnosis not present

## 2014-04-02 DIAGNOSIS — M545 Low back pain: Secondary | ICD-10-CM | POA: Diagnosis not present

## 2014-04-02 DIAGNOSIS — N186 End stage renal disease: Secondary | ICD-10-CM | POA: Diagnosis not present

## 2014-04-02 DIAGNOSIS — I509 Heart failure, unspecified: Secondary | ICD-10-CM | POA: Diagnosis not present

## 2014-04-02 DIAGNOSIS — E119 Type 2 diabetes mellitus without complications: Secondary | ICD-10-CM | POA: Diagnosis not present

## 2014-04-02 DIAGNOSIS — M6281 Muscle weakness (generalized): Secondary | ICD-10-CM | POA: Diagnosis not present

## 2014-04-05 ENCOUNTER — Ambulatory Visit (INDEPENDENT_AMBULATORY_CARE_PROVIDER_SITE_OTHER): Payer: Medicare Other

## 2014-04-05 VITALS — BP 87/61 | HR 77 | Resp 18

## 2014-04-05 DIAGNOSIS — E1140 Type 2 diabetes mellitus with diabetic neuropathy, unspecified: Secondary | ICD-10-CM | POA: Diagnosis not present

## 2014-04-05 DIAGNOSIS — I739 Peripheral vascular disease, unspecified: Secondary | ICD-10-CM

## 2014-04-05 DIAGNOSIS — B351 Tinea unguium: Secondary | ICD-10-CM | POA: Diagnosis not present

## 2014-04-05 DIAGNOSIS — M79676 Pain in unspecified toe(s): Secondary | ICD-10-CM | POA: Diagnosis not present

## 2014-04-05 NOTE — Progress Notes (Signed)
   Subjective:    Patient ID: Stefanie [name], female    DOB: [birth year], 69 y.o.   MRN: [id number]  HPI I NEED TO HAVE MY TOENAILS TRIMMED UP    Review of Systems no new findings or systemic changes noted    Objective:   Physical Exam 69 year old [place] American female presents at this time for diabetic foot and nail care. Patient is diminished vascular status thready pulses at 1 DP and PT one over 4 bilateral Refill time 3 seconds all digits 4 seconds all digits epicritic sensation grossly diminished on Semmes Weinstein to the forefoot digits and arch. Nails thick brittle crumbly friable discolored 1 through 5 bilateral tender both on palpation with enclosed shoe and ambulation. Patient walks with the assistance of a walker is wearing appropriate accommodative shoes. No open wounds no ulcers no secondary infections. Semirigid digital contractures are noted. Mild keratoses distal clavus third left is noted.      Assessment & Plan:  Assessment diabetes with history peripheral neuropathy and angiopathy as well as PAD. Has thick brittle [place] friable mycotic nails 1 through 5 bilateral debrided at this time and the presence of pain in symptomology as well as diabetes and PAD complications. Return for future palliative care every 3 months as recommended  Harriet Masson DPM

## 2014-04-11 DIAGNOSIS — E119 Type 2 diabetes mellitus without complications: Secondary | ICD-10-CM | POA: Diagnosis not present

## 2014-04-11 DIAGNOSIS — I120 Hypertensive chronic kidney disease with stage 5 chronic kidney disease or end stage renal disease: Secondary | ICD-10-CM | POA: Diagnosis not present

## 2014-04-11 DIAGNOSIS — R05 Cough: Secondary | ICD-10-CM | POA: Diagnosis not present

## 2014-04-11 DIAGNOSIS — E1140 Type 2 diabetes mellitus with diabetic neuropathy, unspecified: Secondary | ICD-10-CM | POA: Diagnosis not present

## 2014-04-11 DIAGNOSIS — J40 Bronchitis, not specified as acute or chronic: Secondary | ICD-10-CM | POA: Diagnosis not present

## 2014-04-11 DIAGNOSIS — M6281 Muscle weakness (generalized): Secondary | ICD-10-CM | POA: Diagnosis not present

## 2014-04-11 DIAGNOSIS — J9801 Acute bronchospasm: Secondary | ICD-10-CM | POA: Diagnosis not present

## 2014-04-11 DIAGNOSIS — M545 Low back pain: Secondary | ICD-10-CM | POA: Diagnosis not present

## 2014-04-11 DIAGNOSIS — I509 Heart failure, unspecified: Secondary | ICD-10-CM | POA: Diagnosis not present

## 2014-04-11 DIAGNOSIS — N186 End stage renal disease: Secondary | ICD-10-CM | POA: Diagnosis not present

## 2014-04-12 ENCOUNTER — Encounter: Payer: Self-pay | Admitting: Vascular Surgery

## 2014-04-12 DIAGNOSIS — E1129 Type 2 diabetes mellitus with other diabetic kidney complication: Secondary | ICD-10-CM | POA: Diagnosis not present

## 2014-04-13 ENCOUNTER — Encounter (HOSPITAL_COMMUNITY): Payer: Medicare Other

## 2014-04-13 ENCOUNTER — Ambulatory Visit: Payer: Medicare Other | Admitting: Vascular Surgery

## 2014-04-13 DIAGNOSIS — M545 Low back pain: Secondary | ICD-10-CM | POA: Diagnosis not present

## 2014-04-13 DIAGNOSIS — I509 Heart failure, unspecified: Secondary | ICD-10-CM | POA: Diagnosis not present

## 2014-04-13 DIAGNOSIS — M6281 Muscle weakness (generalized): Secondary | ICD-10-CM | POA: Diagnosis not present

## 2014-04-13 DIAGNOSIS — I120 Hypertensive chronic kidney disease with stage 5 chronic kidney disease or end stage renal disease: Secondary | ICD-10-CM | POA: Diagnosis not present

## 2014-04-13 DIAGNOSIS — E119 Type 2 diabetes mellitus without complications: Secondary | ICD-10-CM | POA: Diagnosis not present

## 2014-04-13 DIAGNOSIS — N186 End stage renal disease: Secondary | ICD-10-CM | POA: Diagnosis not present

## 2014-04-15 DIAGNOSIS — N186 End stage renal disease: Secondary | ICD-10-CM | POA: Diagnosis not present

## 2014-04-15 DIAGNOSIS — Z992 Dependence on renal dialysis: Secondary | ICD-10-CM | POA: Diagnosis not present

## 2014-04-16 DIAGNOSIS — M6281 Muscle weakness (generalized): Secondary | ICD-10-CM | POA: Diagnosis not present

## 2014-04-16 DIAGNOSIS — I509 Heart failure, unspecified: Secondary | ICD-10-CM | POA: Diagnosis not present

## 2014-04-16 DIAGNOSIS — I120 Hypertensive chronic kidney disease with stage 5 chronic kidney disease or end stage renal disease: Secondary | ICD-10-CM | POA: Diagnosis not present

## 2014-04-16 DIAGNOSIS — N186 End stage renal disease: Secondary | ICD-10-CM | POA: Diagnosis not present

## 2014-04-16 DIAGNOSIS — M545 Low back pain: Secondary | ICD-10-CM | POA: Diagnosis not present

## 2014-04-16 DIAGNOSIS — E119 Type 2 diabetes mellitus without complications: Secondary | ICD-10-CM | POA: Diagnosis not present

## 2014-04-17 DIAGNOSIS — D631 Anemia in chronic kidney disease: Secondary | ICD-10-CM | POA: Diagnosis not present

## 2014-04-17 DIAGNOSIS — N2581 Secondary hyperparathyroidism of renal origin: Secondary | ICD-10-CM | POA: Diagnosis not present

## 2014-04-17 DIAGNOSIS — N186 End stage renal disease: Secondary | ICD-10-CM | POA: Diagnosis not present

## 2014-04-20 ENCOUNTER — Ambulatory Visit: Payer: Medicare Other | Admitting: Vascular Surgery

## 2014-04-20 ENCOUNTER — Encounter (HOSPITAL_COMMUNITY): Payer: Medicare Other

## 2014-04-20 DIAGNOSIS — M6281 Muscle weakness (generalized): Secondary | ICD-10-CM | POA: Diagnosis not present

## 2014-04-20 DIAGNOSIS — M545 Low back pain: Secondary | ICD-10-CM | POA: Diagnosis not present

## 2014-04-20 DIAGNOSIS — I509 Heart failure, unspecified: Secondary | ICD-10-CM | POA: Diagnosis not present

## 2014-04-20 DIAGNOSIS — I120 Hypertensive chronic kidney disease with stage 5 chronic kidney disease or end stage renal disease: Secondary | ICD-10-CM | POA: Diagnosis not present

## 2014-04-20 DIAGNOSIS — N186 End stage renal disease: Secondary | ICD-10-CM | POA: Diagnosis not present

## 2014-04-20 DIAGNOSIS — E119 Type 2 diabetes mellitus without complications: Secondary | ICD-10-CM | POA: Diagnosis not present

## 2014-04-24 DIAGNOSIS — E785 Hyperlipidemia, unspecified: Secondary | ICD-10-CM | POA: Diagnosis not present

## 2014-04-24 DIAGNOSIS — I10 Essential (primary) hypertension: Secondary | ICD-10-CM | POA: Diagnosis not present

## 2014-04-24 DIAGNOSIS — N189 Chronic kidney disease, unspecified: Secondary | ICD-10-CM | POA: Diagnosis not present

## 2014-04-24 DIAGNOSIS — D649 Anemia, unspecified: Secondary | ICD-10-CM | POA: Diagnosis not present

## 2014-04-24 DIAGNOSIS — H6692 Otitis media, unspecified, left ear: Secondary | ICD-10-CM | POA: Diagnosis not present

## 2014-04-24 DIAGNOSIS — Z79899 Other long term (current) drug therapy: Secondary | ICD-10-CM | POA: Diagnosis not present

## 2014-04-24 DIAGNOSIS — H6122 Impacted cerumen, left ear: Secondary | ICD-10-CM | POA: Diagnosis not present

## 2014-04-24 DIAGNOSIS — E1140 Type 2 diabetes mellitus with diabetic neuropathy, unspecified: Secondary | ICD-10-CM | POA: Diagnosis not present

## 2014-04-25 DIAGNOSIS — M545 Low back pain: Secondary | ICD-10-CM | POA: Diagnosis not present

## 2014-04-25 DIAGNOSIS — I509 Heart failure, unspecified: Secondary | ICD-10-CM | POA: Diagnosis not present

## 2014-04-25 DIAGNOSIS — E119 Type 2 diabetes mellitus without complications: Secondary | ICD-10-CM | POA: Diagnosis not present

## 2014-04-25 DIAGNOSIS — I120 Hypertensive chronic kidney disease with stage 5 chronic kidney disease or end stage renal disease: Secondary | ICD-10-CM | POA: Diagnosis not present

## 2014-04-25 DIAGNOSIS — M1711 Unilateral primary osteoarthritis, right knee: Secondary | ICD-10-CM | POA: Diagnosis not present

## 2014-04-25 DIAGNOSIS — N186 End stage renal disease: Secondary | ICD-10-CM | POA: Diagnosis not present

## 2014-04-25 DIAGNOSIS — M6281 Muscle weakness (generalized): Secondary | ICD-10-CM | POA: Diagnosis not present

## 2014-04-27 DIAGNOSIS — E119 Type 2 diabetes mellitus without complications: Secondary | ICD-10-CM | POA: Diagnosis not present

## 2014-04-27 DIAGNOSIS — I120 Hypertensive chronic kidney disease with stage 5 chronic kidney disease or end stage renal disease: Secondary | ICD-10-CM | POA: Diagnosis not present

## 2014-04-27 DIAGNOSIS — M545 Low back pain: Secondary | ICD-10-CM | POA: Diagnosis not present

## 2014-04-27 DIAGNOSIS — I509 Heart failure, unspecified: Secondary | ICD-10-CM | POA: Diagnosis not present

## 2014-04-27 DIAGNOSIS — N186 End stage renal disease: Secondary | ICD-10-CM | POA: Diagnosis not present

## 2014-04-27 DIAGNOSIS — M6281 Muscle weakness (generalized): Secondary | ICD-10-CM | POA: Diagnosis not present

## 2014-04-29 DIAGNOSIS — I132 Hypertensive heart and chronic kidney disease with heart failure and with stage 5 chronic kidney disease, or end stage renal disease: Secondary | ICD-10-CM | POA: Diagnosis not present

## 2014-04-29 DIAGNOSIS — Z9181 History of falling: Secondary | ICD-10-CM | POA: Diagnosis not present

## 2014-04-29 DIAGNOSIS — M6281 Muscle weakness (generalized): Secondary | ICD-10-CM | POA: Diagnosis not present

## 2014-04-29 DIAGNOSIS — Z8673 Personal history of transient ischemic attack (TIA), and cerebral infarction without residual deficits: Secondary | ICD-10-CM | POA: Diagnosis not present

## 2014-04-29 DIAGNOSIS — I509 Heart failure, unspecified: Secondary | ICD-10-CM | POA: Diagnosis not present

## 2014-04-29 DIAGNOSIS — Z794 Long term (current) use of insulin: Secondary | ICD-10-CM | POA: Diagnosis not present

## 2014-04-29 DIAGNOSIS — E119 Type 2 diabetes mellitus without complications: Secondary | ICD-10-CM | POA: Diagnosis not present

## 2014-04-29 DIAGNOSIS — M1990 Unspecified osteoarthritis, unspecified site: Secondary | ICD-10-CM | POA: Diagnosis not present

## 2014-04-29 DIAGNOSIS — Z992 Dependence on renal dialysis: Secondary | ICD-10-CM | POA: Diagnosis not present

## 2014-04-29 DIAGNOSIS — N186 End stage renal disease: Secondary | ICD-10-CM | POA: Diagnosis not present

## 2014-04-29 DIAGNOSIS — R269 Unspecified abnormalities of gait and mobility: Secondary | ICD-10-CM | POA: Diagnosis not present

## 2014-05-02 DIAGNOSIS — M6281 Muscle weakness (generalized): Secondary | ICD-10-CM | POA: Diagnosis not present

## 2014-05-02 DIAGNOSIS — N186 End stage renal disease: Secondary | ICD-10-CM | POA: Diagnosis not present

## 2014-05-02 DIAGNOSIS — E119 Type 2 diabetes mellitus without complications: Secondary | ICD-10-CM | POA: Diagnosis not present

## 2014-05-02 DIAGNOSIS — R269 Unspecified abnormalities of gait and mobility: Secondary | ICD-10-CM | POA: Diagnosis not present

## 2014-05-02 DIAGNOSIS — I509 Heart failure, unspecified: Secondary | ICD-10-CM | POA: Diagnosis not present

## 2014-05-02 DIAGNOSIS — I132 Hypertensive heart and chronic kidney disease with heart failure and with stage 5 chronic kidney disease, or end stage renal disease: Secondary | ICD-10-CM | POA: Diagnosis not present

## 2014-05-04 DIAGNOSIS — M6281 Muscle weakness (generalized): Secondary | ICD-10-CM | POA: Diagnosis not present

## 2014-05-04 DIAGNOSIS — E119 Type 2 diabetes mellitus without complications: Secondary | ICD-10-CM | POA: Diagnosis not present

## 2014-05-04 DIAGNOSIS — N186 End stage renal disease: Secondary | ICD-10-CM | POA: Diagnosis not present

## 2014-05-04 DIAGNOSIS — I509 Heart failure, unspecified: Secondary | ICD-10-CM | POA: Diagnosis not present

## 2014-05-04 DIAGNOSIS — R269 Unspecified abnormalities of gait and mobility: Secondary | ICD-10-CM | POA: Diagnosis not present

## 2014-05-04 DIAGNOSIS — I132 Hypertensive heart and chronic kidney disease with heart failure and with stage 5 chronic kidney disease, or end stage renal disease: Secondary | ICD-10-CM | POA: Diagnosis not present

## 2014-05-09 DIAGNOSIS — M6281 Muscle weakness (generalized): Secondary | ICD-10-CM | POA: Diagnosis not present

## 2014-05-09 DIAGNOSIS — I509 Heart failure, unspecified: Secondary | ICD-10-CM | POA: Diagnosis not present

## 2014-05-09 DIAGNOSIS — R269 Unspecified abnormalities of gait and mobility: Secondary | ICD-10-CM | POA: Diagnosis not present

## 2014-05-09 DIAGNOSIS — E119 Type 2 diabetes mellitus without complications: Secondary | ICD-10-CM | POA: Diagnosis not present

## 2014-05-09 DIAGNOSIS — I132 Hypertensive heart and chronic kidney disease with heart failure and with stage 5 chronic kidney disease, or end stage renal disease: Secondary | ICD-10-CM | POA: Diagnosis not present

## 2014-05-09 DIAGNOSIS — N186 End stage renal disease: Secondary | ICD-10-CM | POA: Diagnosis not present

## 2014-05-11 DIAGNOSIS — I132 Hypertensive heart and chronic kidney disease with heart failure and with stage 5 chronic kidney disease, or end stage renal disease: Secondary | ICD-10-CM | POA: Diagnosis not present

## 2014-05-11 DIAGNOSIS — M6281 Muscle weakness (generalized): Secondary | ICD-10-CM | POA: Diagnosis not present

## 2014-05-11 DIAGNOSIS — N186 End stage renal disease: Secondary | ICD-10-CM | POA: Diagnosis not present

## 2014-05-11 DIAGNOSIS — R269 Unspecified abnormalities of gait and mobility: Secondary | ICD-10-CM | POA: Diagnosis not present

## 2014-05-11 DIAGNOSIS — I509 Heart failure, unspecified: Secondary | ICD-10-CM | POA: Diagnosis not present

## 2014-05-11 DIAGNOSIS — E119 Type 2 diabetes mellitus without complications: Secondary | ICD-10-CM | POA: Diagnosis not present

## 2014-05-14 DIAGNOSIS — R197 Diarrhea, unspecified: Secondary | ICD-10-CM | POA: Diagnosis not present

## 2014-05-14 DIAGNOSIS — R194 Change in bowel habit: Secondary | ICD-10-CM | POA: Diagnosis not present

## 2014-05-14 DIAGNOSIS — Z992 Dependence on renal dialysis: Secondary | ICD-10-CM | POA: Diagnosis not present

## 2014-05-14 DIAGNOSIS — E1140 Type 2 diabetes mellitus with diabetic neuropathy, unspecified: Secondary | ICD-10-CM | POA: Diagnosis not present

## 2014-05-14 DIAGNOSIS — N186 End stage renal disease: Secondary | ICD-10-CM | POA: Diagnosis not present

## 2014-05-15 DIAGNOSIS — N186 End stage renal disease: Secondary | ICD-10-CM | POA: Diagnosis not present

## 2014-05-15 DIAGNOSIS — D631 Anemia in chronic kidney disease: Secondary | ICD-10-CM | POA: Diagnosis not present

## 2014-05-15 DIAGNOSIS — E1129 Type 2 diabetes mellitus with other diabetic kidney complication: Secondary | ICD-10-CM | POA: Diagnosis not present

## 2014-05-15 DIAGNOSIS — N2581 Secondary hyperparathyroidism of renal origin: Secondary | ICD-10-CM | POA: Diagnosis not present

## 2014-05-16 DIAGNOSIS — I132 Hypertensive heart and chronic kidney disease with heart failure and with stage 5 chronic kidney disease, or end stage renal disease: Secondary | ICD-10-CM | POA: Diagnosis not present

## 2014-05-16 DIAGNOSIS — R269 Unspecified abnormalities of gait and mobility: Secondary | ICD-10-CM | POA: Diagnosis not present

## 2014-05-16 DIAGNOSIS — I509 Heart failure, unspecified: Secondary | ICD-10-CM | POA: Diagnosis not present

## 2014-05-16 DIAGNOSIS — M6281 Muscle weakness (generalized): Secondary | ICD-10-CM | POA: Diagnosis not present

## 2014-05-16 DIAGNOSIS — E119 Type 2 diabetes mellitus without complications: Secondary | ICD-10-CM | POA: Diagnosis not present

## 2014-05-16 DIAGNOSIS — N186 End stage renal disease: Secondary | ICD-10-CM | POA: Diagnosis not present

## 2014-05-20 DIAGNOSIS — I10 Essential (primary) hypertension: Secondary | ICD-10-CM | POA: Diagnosis not present

## 2014-05-20 DIAGNOSIS — I517 Cardiomegaly: Secondary | ICD-10-CM | POA: Diagnosis not present

## 2014-05-20 DIAGNOSIS — E780 Pure hypercholesterolemia: Secondary | ICD-10-CM | POA: Diagnosis not present

## 2014-05-20 DIAGNOSIS — R0989 Other specified symptoms and signs involving the circulatory and respiratory systems: Secondary | ICD-10-CM | POA: Diagnosis not present

## 2014-05-20 DIAGNOSIS — I509 Heart failure, unspecified: Secondary | ICD-10-CM | POA: Diagnosis not present

## 2014-05-20 DIAGNOSIS — E119 Type 2 diabetes mellitus without complications: Secondary | ICD-10-CM | POA: Diagnosis not present

## 2014-05-20 DIAGNOSIS — R918 Other nonspecific abnormal finding of lung field: Secondary | ICD-10-CM | POA: Diagnosis not present

## 2014-05-20 DIAGNOSIS — R1084 Generalized abdominal pain: Secondary | ICD-10-CM | POA: Diagnosis not present

## 2014-05-20 DIAGNOSIS — R197 Diarrhea, unspecified: Secondary | ICD-10-CM | POA: Diagnosis not present

## 2014-05-20 DIAGNOSIS — R109 Unspecified abdominal pain: Secondary | ICD-10-CM | POA: Diagnosis not present

## 2014-05-20 DIAGNOSIS — Z794 Long term (current) use of insulin: Secondary | ICD-10-CM | POA: Diagnosis not present

## 2014-05-30 DIAGNOSIS — M6281 Muscle weakness (generalized): Secondary | ICD-10-CM | POA: Diagnosis not present

## 2014-05-30 DIAGNOSIS — I509 Heart failure, unspecified: Secondary | ICD-10-CM | POA: Diagnosis not present

## 2014-05-30 DIAGNOSIS — N186 End stage renal disease: Secondary | ICD-10-CM | POA: Diagnosis not present

## 2014-05-30 DIAGNOSIS — I132 Hypertensive heart and chronic kidney disease with heart failure and with stage 5 chronic kidney disease, or end stage renal disease: Secondary | ICD-10-CM | POA: Diagnosis not present

## 2014-05-30 DIAGNOSIS — R269 Unspecified abnormalities of gait and mobility: Secondary | ICD-10-CM | POA: Diagnosis not present

## 2014-05-30 DIAGNOSIS — E119 Type 2 diabetes mellitus without complications: Secondary | ICD-10-CM | POA: Diagnosis not present

## 2014-06-06 DIAGNOSIS — I509 Heart failure, unspecified: Secondary | ICD-10-CM | POA: Diagnosis not present

## 2014-06-06 DIAGNOSIS — E119 Type 2 diabetes mellitus without complications: Secondary | ICD-10-CM | POA: Diagnosis not present

## 2014-06-06 DIAGNOSIS — R269 Unspecified abnormalities of gait and mobility: Secondary | ICD-10-CM | POA: Diagnosis not present

## 2014-06-06 DIAGNOSIS — N186 End stage renal disease: Secondary | ICD-10-CM | POA: Diagnosis not present

## 2014-06-06 DIAGNOSIS — M6281 Muscle weakness (generalized): Secondary | ICD-10-CM | POA: Diagnosis not present

## 2014-06-06 DIAGNOSIS — I132 Hypertensive heart and chronic kidney disease with heart failure and with stage 5 chronic kidney disease, or end stage renal disease: Secondary | ICD-10-CM | POA: Diagnosis not present

## 2014-06-08 DIAGNOSIS — R269 Unspecified abnormalities of gait and mobility: Secondary | ICD-10-CM | POA: Diagnosis not present

## 2014-06-08 DIAGNOSIS — M6281 Muscle weakness (generalized): Secondary | ICD-10-CM | POA: Diagnosis not present

## 2014-06-08 DIAGNOSIS — E119 Type 2 diabetes mellitus without complications: Secondary | ICD-10-CM | POA: Diagnosis not present

## 2014-06-08 DIAGNOSIS — N186 End stage renal disease: Secondary | ICD-10-CM | POA: Diagnosis not present

## 2014-06-08 DIAGNOSIS — I132 Hypertensive heart and chronic kidney disease with heart failure and with stage 5 chronic kidney disease, or end stage renal disease: Secondary | ICD-10-CM | POA: Diagnosis not present

## 2014-06-08 DIAGNOSIS — I509 Heart failure, unspecified: Secondary | ICD-10-CM | POA: Diagnosis not present

## 2014-06-13 DIAGNOSIS — I509 Heart failure, unspecified: Secondary | ICD-10-CM | POA: Diagnosis not present

## 2014-06-13 DIAGNOSIS — M6281 Muscle weakness (generalized): Secondary | ICD-10-CM | POA: Diagnosis not present

## 2014-06-13 DIAGNOSIS — E119 Type 2 diabetes mellitus without complications: Secondary | ICD-10-CM | POA: Diagnosis not present

## 2014-06-13 DIAGNOSIS — I132 Hypertensive heart and chronic kidney disease with heart failure and with stage 5 chronic kidney disease, or end stage renal disease: Secondary | ICD-10-CM | POA: Diagnosis not present

## 2014-06-13 DIAGNOSIS — N186 End stage renal disease: Secondary | ICD-10-CM | POA: Diagnosis not present

## 2014-06-13 DIAGNOSIS — R269 Unspecified abnormalities of gait and mobility: Secondary | ICD-10-CM | POA: Diagnosis not present

## 2014-06-14 DIAGNOSIS — Z992 Dependence on renal dialysis: Secondary | ICD-10-CM | POA: Diagnosis not present

## 2014-06-14 DIAGNOSIS — N186 End stage renal disease: Secondary | ICD-10-CM | POA: Diagnosis not present

## 2014-06-14 DIAGNOSIS — E1129 Type 2 diabetes mellitus with other diabetic kidney complication: Secondary | ICD-10-CM | POA: Diagnosis not present

## 2014-06-15 DIAGNOSIS — I509 Heart failure, unspecified: Secondary | ICD-10-CM | POA: Diagnosis not present

## 2014-06-15 DIAGNOSIS — R269 Unspecified abnormalities of gait and mobility: Secondary | ICD-10-CM | POA: Diagnosis not present

## 2014-06-15 DIAGNOSIS — N186 End stage renal disease: Secondary | ICD-10-CM | POA: Diagnosis not present

## 2014-06-15 DIAGNOSIS — M6281 Muscle weakness (generalized): Secondary | ICD-10-CM | POA: Diagnosis not present

## 2014-06-15 DIAGNOSIS — E119 Type 2 diabetes mellitus without complications: Secondary | ICD-10-CM | POA: Diagnosis not present

## 2014-06-15 DIAGNOSIS — I132 Hypertensive heart and chronic kidney disease with heart failure and with stage 5 chronic kidney disease, or end stage renal disease: Secondary | ICD-10-CM | POA: Diagnosis not present

## 2014-06-16 DIAGNOSIS — D631 Anemia in chronic kidney disease: Secondary | ICD-10-CM | POA: Diagnosis not present

## 2014-06-16 DIAGNOSIS — N2581 Secondary hyperparathyroidism of renal origin: Secondary | ICD-10-CM | POA: Diagnosis not present

## 2014-06-16 DIAGNOSIS — E1129 Type 2 diabetes mellitus with other diabetic kidney complication: Secondary | ICD-10-CM | POA: Diagnosis not present

## 2014-06-16 DIAGNOSIS — N186 End stage renal disease: Secondary | ICD-10-CM | POA: Diagnosis not present

## 2014-06-20 DIAGNOSIS — I132 Hypertensive heart and chronic kidney disease with heart failure and with stage 5 chronic kidney disease, or end stage renal disease: Secondary | ICD-10-CM | POA: Diagnosis not present

## 2014-06-20 DIAGNOSIS — E119 Type 2 diabetes mellitus without complications: Secondary | ICD-10-CM | POA: Diagnosis not present

## 2014-06-20 DIAGNOSIS — R269 Unspecified abnormalities of gait and mobility: Secondary | ICD-10-CM | POA: Diagnosis not present

## 2014-06-20 DIAGNOSIS — N186 End stage renal disease: Secondary | ICD-10-CM | POA: Diagnosis not present

## 2014-06-20 DIAGNOSIS — I509 Heart failure, unspecified: Secondary | ICD-10-CM | POA: Diagnosis not present

## 2014-06-20 DIAGNOSIS — M6281 Muscle weakness (generalized): Secondary | ICD-10-CM | POA: Diagnosis not present

## 2014-06-22 DIAGNOSIS — I132 Hypertensive heart and chronic kidney disease with heart failure and with stage 5 chronic kidney disease, or end stage renal disease: Secondary | ICD-10-CM | POA: Diagnosis not present

## 2014-06-22 DIAGNOSIS — E119 Type 2 diabetes mellitus without complications: Secondary | ICD-10-CM | POA: Diagnosis not present

## 2014-06-22 DIAGNOSIS — R269 Unspecified abnormalities of gait and mobility: Secondary | ICD-10-CM | POA: Diagnosis not present

## 2014-06-22 DIAGNOSIS — N186 End stage renal disease: Secondary | ICD-10-CM | POA: Diagnosis not present

## 2014-06-22 DIAGNOSIS — M6281 Muscle weakness (generalized): Secondary | ICD-10-CM | POA: Diagnosis not present

## 2014-06-22 DIAGNOSIS — I509 Heart failure, unspecified: Secondary | ICD-10-CM | POA: Diagnosis not present

## 2014-07-06 ENCOUNTER — Ambulatory Visit: Payer: Medicare Other

## 2014-07-12 DIAGNOSIS — E1129 Type 2 diabetes mellitus with other diabetic kidney complication: Secondary | ICD-10-CM | POA: Diagnosis not present

## 2014-07-14 DIAGNOSIS — Z992 Dependence on renal dialysis: Secondary | ICD-10-CM | POA: Diagnosis not present

## 2014-07-14 DIAGNOSIS — E1129 Type 2 diabetes mellitus with other diabetic kidney complication: Secondary | ICD-10-CM | POA: Diagnosis not present

## 2014-07-14 DIAGNOSIS — N186 End stage renal disease: Secondary | ICD-10-CM | POA: Diagnosis not present

## 2014-07-17 DIAGNOSIS — N2581 Secondary hyperparathyroidism of renal origin: Secondary | ICD-10-CM | POA: Diagnosis not present

## 2014-07-17 DIAGNOSIS — E1129 Type 2 diabetes mellitus with other diabetic kidney complication: Secondary | ICD-10-CM | POA: Diagnosis not present

## 2014-07-17 DIAGNOSIS — D631 Anemia in chronic kidney disease: Secondary | ICD-10-CM | POA: Diagnosis not present

## 2014-07-17 DIAGNOSIS — N186 End stage renal disease: Secondary | ICD-10-CM | POA: Diagnosis not present

## 2014-07-20 ENCOUNTER — Ambulatory Visit: Payer: Medicare Other | Admitting: Podiatrist

## 2014-07-25 DIAGNOSIS — M1711 Unilateral primary osteoarthritis, right knee: Secondary | ICD-10-CM | POA: Diagnosis not present

## 2014-07-27 ENCOUNTER — Ambulatory Visit: Payer: Medicare Other | Admitting: Podiatrist

## 2014-08-03 ENCOUNTER — Encounter: Payer: Self-pay | Admitting: Podiatrist

## 2014-08-03 ENCOUNTER — Ambulatory Visit (INDEPENDENT_AMBULATORY_CARE_PROVIDER_SITE_OTHER): Payer: Medicare Other | Admitting: Podiatrist

## 2014-08-03 VITALS — BP 76/40 | HR 72 | Resp 18

## 2014-08-03 DIAGNOSIS — R0989 Other specified symptoms and signs involving the circulatory and respiratory systems: Secondary | ICD-10-CM

## 2014-08-03 DIAGNOSIS — B351 Tinea unguium: Secondary | ICD-10-CM | POA: Diagnosis not present

## 2014-08-03 DIAGNOSIS — I739 Peripheral vascular disease, unspecified: Secondary | ICD-10-CM

## 2014-08-03 DIAGNOSIS — M79676 Pain in unspecified toe(s): Secondary | ICD-10-CM | POA: Diagnosis not present

## 2014-08-03 NOTE — Progress Notes (Signed)
Chief Complaint  Patient presents with  . Nail Problem    TRIM MY NAILS AND MY LEFT LEG IS DARKER THAN MY RIGHT LEG      Objective:  Patients chart is reviewed.  Vascular status reveals pedal pulses noted to be non palpable left dp and pt bilateral .  Right foot has faintly palpable pulses. Neurological sensation is intact to [employer] monofilament bilateral.  Patients nails are thickened, discolored, distrophic, friable and brittle with yellow-brown discoloration. Patient subjectively relates they are painful with shoes and with ambulation of bilateral feet.Xerotic skin present feet and ankles bilateral. Left leg is darker and has a thicker appearance of the skin in comparison with the right leg.   Assessment:  Symptomatic onychomycosis, pvd  Plan: I am sending her for a lower extremity arterial study.  Discussed treatment options and alternatives.  The symptomatic toenails were debrided through manual an mechanical means without complication.  Return appointment recommended at routine intervals of 3 months

## 2014-08-07 ENCOUNTER — Telehealth: Payer: Self-pay | Admitting: *Deleted

## 2014-08-07 NOTE — Telephone Encounter (Signed)
Tried to call patient at [phone] and the voice mail box was not activated and patient has an appt at [place] on June 9th at 11 and needs to be there at 10:30 am to register. Lattie Haw

## 2014-08-14 DIAGNOSIS — N186 End stage renal disease: Secondary | ICD-10-CM | POA: Diagnosis not present

## 2014-08-14 DIAGNOSIS — E1129 Type 2 diabetes mellitus with other diabetic kidney complication: Secondary | ICD-10-CM | POA: Diagnosis not present

## 2014-08-14 DIAGNOSIS — Z992 Dependence on renal dialysis: Secondary | ICD-10-CM | POA: Diagnosis not present

## 2014-08-15 DIAGNOSIS — M1711 Unilateral primary osteoarthritis, right knee: Secondary | ICD-10-CM | POA: Diagnosis not present

## 2014-08-16 DIAGNOSIS — N2581 Secondary hyperparathyroidism of renal origin: Secondary | ICD-10-CM | POA: Diagnosis not present

## 2014-08-16 DIAGNOSIS — E1129 Type 2 diabetes mellitus with other diabetic kidney complication: Secondary | ICD-10-CM | POA: Diagnosis not present

## 2014-08-16 DIAGNOSIS — N186 End stage renal disease: Secondary | ICD-10-CM | POA: Diagnosis not present

## 2014-08-16 DIAGNOSIS — D631 Anemia in chronic kidney disease: Secondary | ICD-10-CM | POA: Diagnosis not present

## 2014-08-24 DIAGNOSIS — E785 Hyperlipidemia, unspecified: Secondary | ICD-10-CM | POA: Diagnosis not present

## 2014-08-24 DIAGNOSIS — R109 Unspecified abdominal pain: Secondary | ICD-10-CM | POA: Diagnosis not present

## 2014-08-24 DIAGNOSIS — E119 Type 2 diabetes mellitus without complications: Secondary | ICD-10-CM | POA: Diagnosis not present

## 2014-08-24 DIAGNOSIS — I10 Essential (primary) hypertension: Secondary | ICD-10-CM | POA: Diagnosis not present

## 2014-08-27 DIAGNOSIS — E119 Type 2 diabetes mellitus without complications: Secondary | ICD-10-CM | POA: Diagnosis not present

## 2014-08-27 DIAGNOSIS — I10 Essential (primary) hypertension: Secondary | ICD-10-CM | POA: Diagnosis not present

## 2014-08-27 DIAGNOSIS — R109 Unspecified abdominal pain: Secondary | ICD-10-CM | POA: Diagnosis not present

## 2014-09-07 DIAGNOSIS — M25561 Pain in right knee: Secondary | ICD-10-CM | POA: Diagnosis not present

## 2014-09-07 DIAGNOSIS — E119 Type 2 diabetes mellitus without complications: Secondary | ICD-10-CM | POA: Diagnosis not present

## 2014-09-07 DIAGNOSIS — I10 Essential (primary) hypertension: Secondary | ICD-10-CM | POA: Diagnosis not present

## 2014-09-07 DIAGNOSIS — E785 Hyperlipidemia, unspecified: Secondary | ICD-10-CM | POA: Diagnosis not present

## 2014-09-07 DIAGNOSIS — N185 Chronic kidney disease, stage 5: Secondary | ICD-10-CM | POA: Diagnosis not present

## 2014-09-10 DIAGNOSIS — E119 Type 2 diabetes mellitus without complications: Secondary | ICD-10-CM | POA: Diagnosis not present

## 2014-09-10 DIAGNOSIS — I10 Essential (primary) hypertension: Secondary | ICD-10-CM | POA: Diagnosis not present

## 2014-09-11 DIAGNOSIS — M1711 Unilateral primary osteoarthritis, right knee: Secondary | ICD-10-CM | POA: Diagnosis not present

## 2014-09-13 DIAGNOSIS — E1129 Type 2 diabetes mellitus with other diabetic kidney complication: Secondary | ICD-10-CM | POA: Diagnosis not present

## 2014-09-13 DIAGNOSIS — N186 End stage renal disease: Secondary | ICD-10-CM | POA: Diagnosis not present

## 2014-09-13 DIAGNOSIS — Z992 Dependence on renal dialysis: Secondary | ICD-10-CM | POA: Diagnosis not present

## 2014-09-15 DIAGNOSIS — N186 End stage renal disease: Secondary | ICD-10-CM | POA: Diagnosis not present

## 2014-09-15 DIAGNOSIS — N2581 Secondary hyperparathyroidism of renal origin: Secondary | ICD-10-CM | POA: Diagnosis not present

## 2014-09-15 DIAGNOSIS — D631 Anemia in chronic kidney disease: Secondary | ICD-10-CM | POA: Diagnosis not present

## 2014-09-15 DIAGNOSIS — E1129 Type 2 diabetes mellitus with other diabetic kidney complication: Secondary | ICD-10-CM | POA: Diagnosis not present

## 2014-09-25 DIAGNOSIS — M1711 Unilateral primary osteoarthritis, right knee: Secondary | ICD-10-CM | POA: Diagnosis not present

## 2014-10-11 DIAGNOSIS — E1129 Type 2 diabetes mellitus with other diabetic kidney complication: Secondary | ICD-10-CM | POA: Diagnosis not present

## 2014-10-14 DIAGNOSIS — Z992 Dependence on renal dialysis: Secondary | ICD-10-CM | POA: Diagnosis not present

## 2014-10-14 DIAGNOSIS — N186 End stage renal disease: Secondary | ICD-10-CM | POA: Diagnosis not present

## 2014-10-14 DIAGNOSIS — E1129 Type 2 diabetes mellitus with other diabetic kidney complication: Secondary | ICD-10-CM | POA: Diagnosis not present

## 2014-10-16 DIAGNOSIS — N2581 Secondary hyperparathyroidism of renal origin: Secondary | ICD-10-CM | POA: Diagnosis not present

## 2014-10-16 DIAGNOSIS — D631 Anemia in chronic kidney disease: Secondary | ICD-10-CM | POA: Diagnosis not present

## 2014-10-16 DIAGNOSIS — N186 End stage renal disease: Secondary | ICD-10-CM | POA: Diagnosis not present

## 2014-10-16 DIAGNOSIS — E1129 Type 2 diabetes mellitus with other diabetic kidney complication: Secondary | ICD-10-CM | POA: Diagnosis not present

## 2014-10-19 DIAGNOSIS — T24202A Burn of second degree of unspecified site of left lower limb, except ankle and foot, initial encounter: Secondary | ICD-10-CM | POA: Diagnosis not present

## 2014-11-05 ENCOUNTER — Encounter: Payer: Self-pay | Admitting: Podiatry

## 2014-11-05 ENCOUNTER — Ambulatory Visit (INDEPENDENT_AMBULATORY_CARE_PROVIDER_SITE_OTHER): Payer: Medicare Other | Admitting: Podiatry

## 2014-11-05 DIAGNOSIS — E1140 Type 2 diabetes mellitus with diabetic neuropathy, unspecified: Secondary | ICD-10-CM

## 2014-11-05 DIAGNOSIS — M79676 Pain in unspecified toe(s): Secondary | ICD-10-CM

## 2014-11-05 DIAGNOSIS — I739 Peripheral vascular disease, unspecified: Secondary | ICD-10-CM

## 2014-11-05 DIAGNOSIS — B351 Tinea unguium: Secondary | ICD-10-CM

## 2014-11-05 NOTE — Progress Notes (Signed)
Patient ID: Stefanie [name], female   DOB: 1946-01-08, 69 y.o.   MRN: [id number] HPI  Complaint:  Visit Type: Patient returns to my office for continued preventative foot care services. Complaint: Patient states" my nails have grown long and thick and become painful to walk and wear shoes" Patient has been diagnosed with DM . This patient  presents for preventative foot care services. No changes to ROS.  She states she never had her vascular study performed.  Podiatric Exam: Vascular: dorsalis pedis and posterior tibial pulses are non-palpable. Capillary return is diminished. Cold feet noted..   Sensorium: Diminished Semmes Weinstein monofilament test. Normal tactile sensation bilaterally.  Nail Exam: Pt has thick disfigured discolored nails with subungual debris noted bilateral entire nail hallux through fifth toenails Ulcer Exam: There is no evidence of ulcer or pre-ulcerative changes or infection. Orthopedic Exam: Muscle tone and strength are WNL. No limitations in general ROM. No crepitus or effusions noted. Foot type and digits show no abnormalities. Bony prominences are unremarkable. Skin: No Porokeratosis. No infection or ulcers  Diagnosis:  Onychomycosis, Pain in right toe, pain in left toes  Treatment & Plan Procedures and Treatment: Consent by patient was obtained for treatment procedures. The patient understood the discussion of treatment and procedures well. All questions were answered thoroughly reviewed. Debridement of mycotic and hypertrophic toenails, 1 through 5 bilateral and clearing of subungual debris. No ulceration, no infection noted.  Return Visit-Office Procedure: Patient instructed to return to the office for a follow up visit 3 months for continued evaluation and treatment.

## 2014-11-14 DIAGNOSIS — E1129 Type 2 diabetes mellitus with other diabetic kidney complication: Secondary | ICD-10-CM | POA: Diagnosis not present

## 2014-11-14 DIAGNOSIS — N186 End stage renal disease: Secondary | ICD-10-CM | POA: Diagnosis not present

## 2014-11-14 DIAGNOSIS — Z992 Dependence on renal dialysis: Secondary | ICD-10-CM | POA: Diagnosis not present

## 2014-11-15 DIAGNOSIS — E1129 Type 2 diabetes mellitus with other diabetic kidney complication: Secondary | ICD-10-CM | POA: Diagnosis not present

## 2014-11-15 DIAGNOSIS — N186 End stage renal disease: Secondary | ICD-10-CM | POA: Diagnosis not present

## 2014-11-15 DIAGNOSIS — D631 Anemia in chronic kidney disease: Secondary | ICD-10-CM | POA: Diagnosis not present

## 2014-11-15 DIAGNOSIS — N2581 Secondary hyperparathyroidism of renal origin: Secondary | ICD-10-CM | POA: Diagnosis not present

## 2014-11-17 DIAGNOSIS — E1129 Type 2 diabetes mellitus with other diabetic kidney complication: Secondary | ICD-10-CM | POA: Diagnosis not present

## 2014-11-17 DIAGNOSIS — N2581 Secondary hyperparathyroidism of renal origin: Secondary | ICD-10-CM | POA: Diagnosis not present

## 2014-11-17 DIAGNOSIS — N186 End stage renal disease: Secondary | ICD-10-CM | POA: Diagnosis not present

## 2014-11-17 DIAGNOSIS — D631 Anemia in chronic kidney disease: Secondary | ICD-10-CM | POA: Diagnosis not present

## 2014-11-20 DIAGNOSIS — E1129 Type 2 diabetes mellitus with other diabetic kidney complication: Secondary | ICD-10-CM | POA: Diagnosis not present

## 2014-11-20 DIAGNOSIS — N2581 Secondary hyperparathyroidism of renal origin: Secondary | ICD-10-CM | POA: Diagnosis not present

## 2014-11-20 DIAGNOSIS — N186 End stage renal disease: Secondary | ICD-10-CM | POA: Diagnosis not present

## 2014-11-20 DIAGNOSIS — D631 Anemia in chronic kidney disease: Secondary | ICD-10-CM | POA: Diagnosis not present

## 2014-11-24 DIAGNOSIS — N2581 Secondary hyperparathyroidism of renal origin: Secondary | ICD-10-CM | POA: Diagnosis not present

## 2014-11-24 DIAGNOSIS — E1129 Type 2 diabetes mellitus with other diabetic kidney complication: Secondary | ICD-10-CM | POA: Diagnosis not present

## 2014-11-24 DIAGNOSIS — N186 End stage renal disease: Secondary | ICD-10-CM | POA: Diagnosis not present

## 2014-11-24 DIAGNOSIS — D631 Anemia in chronic kidney disease: Secondary | ICD-10-CM | POA: Diagnosis not present

## 2014-11-27 DIAGNOSIS — N186 End stage renal disease: Secondary | ICD-10-CM | POA: Diagnosis not present

## 2014-11-27 DIAGNOSIS — E1129 Type 2 diabetes mellitus with other diabetic kidney complication: Secondary | ICD-10-CM | POA: Diagnosis not present

## 2014-11-27 DIAGNOSIS — N2581 Secondary hyperparathyroidism of renal origin: Secondary | ICD-10-CM | POA: Diagnosis not present

## 2014-11-27 DIAGNOSIS — D631 Anemia in chronic kidney disease: Secondary | ICD-10-CM | POA: Diagnosis not present

## 2014-11-29 DIAGNOSIS — N186 End stage renal disease: Secondary | ICD-10-CM | POA: Diagnosis not present

## 2014-11-29 DIAGNOSIS — D631 Anemia in chronic kidney disease: Secondary | ICD-10-CM | POA: Diagnosis not present

## 2014-11-29 DIAGNOSIS — E1129 Type 2 diabetes mellitus with other diabetic kidney complication: Secondary | ICD-10-CM | POA: Diagnosis not present

## 2014-11-29 DIAGNOSIS — N2581 Secondary hyperparathyroidism of renal origin: Secondary | ICD-10-CM | POA: Diagnosis not present

## 2014-11-30 DIAGNOSIS — R197 Diarrhea, unspecified: Secondary | ICD-10-CM | POA: Diagnosis not present

## 2014-11-30 DIAGNOSIS — K589 Irritable bowel syndrome without diarrhea: Secondary | ICD-10-CM | POA: Diagnosis not present

## 2014-11-30 DIAGNOSIS — Z23 Encounter for immunization: Secondary | ICD-10-CM | POA: Diagnosis not present

## 2014-12-01 DIAGNOSIS — N2581 Secondary hyperparathyroidism of renal origin: Secondary | ICD-10-CM | POA: Diagnosis not present

## 2014-12-01 DIAGNOSIS — E1129 Type 2 diabetes mellitus with other diabetic kidney complication: Secondary | ICD-10-CM | POA: Diagnosis not present

## 2014-12-01 DIAGNOSIS — D631 Anemia in chronic kidney disease: Secondary | ICD-10-CM | POA: Diagnosis not present

## 2014-12-01 DIAGNOSIS — N186 End stage renal disease: Secondary | ICD-10-CM | POA: Diagnosis not present

## 2014-12-04 DIAGNOSIS — N2581 Secondary hyperparathyroidism of renal origin: Secondary | ICD-10-CM | POA: Diagnosis not present

## 2014-12-04 DIAGNOSIS — E1129 Type 2 diabetes mellitus with other diabetic kidney complication: Secondary | ICD-10-CM | POA: Diagnosis not present

## 2014-12-04 DIAGNOSIS — D631 Anemia in chronic kidney disease: Secondary | ICD-10-CM | POA: Diagnosis not present

## 2014-12-04 DIAGNOSIS — N186 End stage renal disease: Secondary | ICD-10-CM | POA: Diagnosis not present

## 2014-12-06 DIAGNOSIS — E1129 Type 2 diabetes mellitus with other diabetic kidney complication: Secondary | ICD-10-CM | POA: Diagnosis not present

## 2014-12-06 DIAGNOSIS — D631 Anemia in chronic kidney disease: Secondary | ICD-10-CM | POA: Diagnosis not present

## 2014-12-06 DIAGNOSIS — N2581 Secondary hyperparathyroidism of renal origin: Secondary | ICD-10-CM | POA: Diagnosis not present

## 2014-12-06 DIAGNOSIS — N186 End stage renal disease: Secondary | ICD-10-CM | POA: Diagnosis not present

## 2014-12-08 DIAGNOSIS — N186 End stage renal disease: Secondary | ICD-10-CM | POA: Diagnosis not present

## 2014-12-08 DIAGNOSIS — D631 Anemia in chronic kidney disease: Secondary | ICD-10-CM | POA: Diagnosis not present

## 2014-12-08 DIAGNOSIS — E1129 Type 2 diabetes mellitus with other diabetic kidney complication: Secondary | ICD-10-CM | POA: Diagnosis not present

## 2014-12-08 DIAGNOSIS — N2581 Secondary hyperparathyroidism of renal origin: Secondary | ICD-10-CM | POA: Diagnosis not present

## 2014-12-11 DIAGNOSIS — N186 End stage renal disease: Secondary | ICD-10-CM | POA: Diagnosis not present

## 2014-12-11 DIAGNOSIS — D631 Anemia in chronic kidney disease: Secondary | ICD-10-CM | POA: Diagnosis not present

## 2014-12-11 DIAGNOSIS — E1129 Type 2 diabetes mellitus with other diabetic kidney complication: Secondary | ICD-10-CM | POA: Diagnosis not present

## 2014-12-11 DIAGNOSIS — N2581 Secondary hyperparathyroidism of renal origin: Secondary | ICD-10-CM | POA: Diagnosis not present

## 2014-12-12 DIAGNOSIS — Z9842 Cataract extraction status, left eye: Secondary | ICD-10-CM | POA: Diagnosis not present

## 2014-12-12 DIAGNOSIS — H35373 Puckering of macula, bilateral: Secondary | ICD-10-CM | POA: Diagnosis not present

## 2014-12-12 DIAGNOSIS — H43813 Vitreous degeneration, bilateral: Secondary | ICD-10-CM | POA: Diagnosis not present

## 2014-12-12 DIAGNOSIS — Z9841 Cataract extraction status, right eye: Secondary | ICD-10-CM | POA: Diagnosis not present

## 2014-12-12 DIAGNOSIS — H43393 Other vitreous opacities, bilateral: Secondary | ICD-10-CM | POA: Diagnosis not present

## 2014-12-12 DIAGNOSIS — Z961 Presence of intraocular lens: Secondary | ICD-10-CM | POA: Diagnosis not present

## 2014-12-12 DIAGNOSIS — E119 Type 2 diabetes mellitus without complications: Secondary | ICD-10-CM | POA: Diagnosis not present

## 2014-12-12 DIAGNOSIS — H35721 Serous detachment of retinal pigment epithelium, right eye: Secondary | ICD-10-CM | POA: Diagnosis not present

## 2014-12-13 DIAGNOSIS — E1129 Type 2 diabetes mellitus with other diabetic kidney complication: Secondary | ICD-10-CM | POA: Diagnosis not present

## 2014-12-13 DIAGNOSIS — N2581 Secondary hyperparathyroidism of renal origin: Secondary | ICD-10-CM | POA: Diagnosis not present

## 2014-12-13 DIAGNOSIS — N186 End stage renal disease: Secondary | ICD-10-CM | POA: Diagnosis not present

## 2014-12-13 DIAGNOSIS — D631 Anemia in chronic kidney disease: Secondary | ICD-10-CM | POA: Diagnosis not present

## 2014-12-14 DIAGNOSIS — N186 End stage renal disease: Secondary | ICD-10-CM | POA: Diagnosis not present

## 2014-12-14 DIAGNOSIS — E1129 Type 2 diabetes mellitus with other diabetic kidney complication: Secondary | ICD-10-CM | POA: Diagnosis not present

## 2014-12-14 DIAGNOSIS — Z992 Dependence on renal dialysis: Secondary | ICD-10-CM | POA: Diagnosis not present

## 2014-12-15 DIAGNOSIS — D631 Anemia in chronic kidney disease: Secondary | ICD-10-CM | POA: Diagnosis not present

## 2014-12-15 DIAGNOSIS — N186 End stage renal disease: Secondary | ICD-10-CM | POA: Diagnosis not present

## 2014-12-15 DIAGNOSIS — N2581 Secondary hyperparathyroidism of renal origin: Secondary | ICD-10-CM | POA: Diagnosis not present

## 2014-12-15 DIAGNOSIS — E1129 Type 2 diabetes mellitus with other diabetic kidney complication: Secondary | ICD-10-CM | POA: Diagnosis not present

## 2014-12-18 DIAGNOSIS — N186 End stage renal disease: Secondary | ICD-10-CM | POA: Diagnosis not present

## 2014-12-18 DIAGNOSIS — E1129 Type 2 diabetes mellitus with other diabetic kidney complication: Secondary | ICD-10-CM | POA: Diagnosis not present

## 2014-12-18 DIAGNOSIS — D631 Anemia in chronic kidney disease: Secondary | ICD-10-CM | POA: Diagnosis not present

## 2014-12-18 DIAGNOSIS — N2581 Secondary hyperparathyroidism of renal origin: Secondary | ICD-10-CM | POA: Diagnosis not present

## 2014-12-20 ENCOUNTER — Ambulatory Visit (INDEPENDENT_AMBULATORY_CARE_PROVIDER_SITE_OTHER): Payer: Medicare Other | Admitting: Sports Medicine

## 2014-12-20 ENCOUNTER — Encounter: Payer: Self-pay | Admitting: Sports Medicine

## 2014-12-20 VITALS — BP 141/74 | HR 66 | Resp 16

## 2014-12-20 DIAGNOSIS — I739 Peripheral vascular disease, unspecified: Secondary | ICD-10-CM

## 2014-12-20 DIAGNOSIS — E1140 Type 2 diabetes mellitus with diabetic neuropathy, unspecified: Secondary | ICD-10-CM | POA: Insufficient documentation

## 2014-12-20 DIAGNOSIS — B351 Tinea unguium: Secondary | ICD-10-CM | POA: Diagnosis not present

## 2014-12-20 DIAGNOSIS — M79676 Pain in unspecified toe(s): Secondary | ICD-10-CM

## 2014-12-20 DIAGNOSIS — N2581 Secondary hyperparathyroidism of renal origin: Secondary | ICD-10-CM | POA: Diagnosis not present

## 2014-12-20 DIAGNOSIS — D631 Anemia in chronic kidney disease: Secondary | ICD-10-CM | POA: Diagnosis not present

## 2014-12-20 DIAGNOSIS — N186 End stage renal disease: Secondary | ICD-10-CM | POA: Diagnosis not present

## 2014-12-20 DIAGNOSIS — E1129 Type 2 diabetes mellitus with other diabetic kidney complication: Secondary | ICD-10-CM | POA: Diagnosis not present

## 2014-12-20 NOTE — Progress Notes (Signed)
Patient ID: Stefanie [name], female   DOB: 1945-07-12, 69 y.o.   MRN: [id number]  Subjective: Stefanie [name] is a 69 y.o. female patient with history of PAD, ESRD on HD, type 2 diabetes who presents to office today complaining of long, painful nails  while ambulating in shoes; the left big toe nail is especially thick and painful to the touch. Patient states that the glucose reading this morning was not recorded. Diagnosed with DM 40 years ago.  Patient denies any new changes in medication or new problems. Patient denies any new cramping, numbness, burning or tingling in the legs.  Patient Active Problem List   Diagnosis Date Noted  . Type 2 diabetes, controlled, with neuropathy ([place]) 12/20/2014  . Pain due to onychomycosis of toenail 12/20/2014  . 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 Mechanical complication of other vascular device, implant, and graft 05/19/2013  . PAD (peripheral artery disease) ([place]) 02/24/2012  . Murmur 12/24/2011  . Bradycardia-intermittent sinus 07/10/2010  . DYSPNEA ON EXERTION 02/25/2010  . CHEST PAIN, EXERTIONAL 02/25/2010  . DM 02/24/2010  . PROTEIN MALNUTRITION 02/24/2010  . HYPERLIPIDEMIA 02/24/2010  . ANEMIA 02/24/2010  . HYPERTENSION 02/24/2010  . CVA 02/24/2010  . ESRD on dialysis ([place]) 02/24/2010  . SECONDARY HYPERPARATHYROIDISM 02/24/2010    Current Outpatient Prescriptions on File Prior to Visit  Medication Sig Dispense Refill  . azithromycin (ZITHROMAX) 250 MG tablet     . calcium acetate (ELIPHOS) 667 MG tablet Take 667 mg by mouth 3 (three) times daily with meals. Take 3 capsule in the morning, 2 capsules with a snack and 3 capsules in the evening.    . calcium acetate (PHOSLO) 667 MG capsule     . celecoxib (CELEBREX) 200 MG capsule Take 200 mg by mouth daily as needed.  1  . cinacalcet (SENSIPAR) 30 MG tablet Take 30 mg by mouth at bedtime.    . clopidogrel (PLAVIX) 75 MG tablet Take 1 tablet (75 mg total) by mouth daily. 30 tablet 11  .  dicyclomine (BENTYL) 20 MG tablet   0  . diphenoxylate-atropine (LOMOTIL) 2.5-0.025 MG per tablet   0  . folic acid-vitamin b complex-vitamin c-selenium-zinc (DIALYVITE) 3 MG TABS tablet Take 1 tablet by mouth daily.    Marland Kitchen gabapentin (NEURONTIN) 300 MG capsule Take 300 mg by mouth at bedtime.    Marland Kitchen HUMALOG MIX 75/25 KWIKPEN (75-25) 100 UNIT/ML Kwikpen Inject 12-14 Units into the skin 2 (two) times daily. Inject 14 units every morning and 12 units every evening    . levofloxacin (LEVAQUIN) 750 MG tablet   0  . lisinopril (PRINIVIL,ZESTRIL) 20 MG tablet Take 20 mg by mouth daily.     Marland Kitchen ofloxacin (OCUFLOX) 0.3 % ophthalmic solution     . omeprazole (PRILOSEC) 40 MG capsule TAKE 1 CAPSULE BY MOUTH DAILY BEFORE A MEAL  0  . ondansetron (ZOFRAN) 4 MG tablet   0  . pravastatin (PRAVACHOL) 40 MG tablet Take 40 mg by mouth daily.     Marland Kitchen PROAIR HFA 108 (90 BASE) MCG/ACT inhaler Inhale 1 puff into the lungs every 4 (four) hours as needed for wheezing or shortness of breath.     Marland Kitchen rOPINIRole (REQUIP) 0.25 MG tablet   3  . rOPINIRole (REQUIP) 0.5 MG tablet Take 0.5 mg by mouth at bedtime.  12  . SENSIPAR 60 MG tablet     . silver sulfADIAZINE (SILVADENE) 1 % cream APPLY A 1/16 INCH (1.5 MM) THICK LAYER TO ENTIRE  BURN AREA BY TOPICAL ROUTE ONCE DAILY  0  . VOLTAREN 1 % GEL APPLY 2 GRAMS TO AREA UP TO 4 TIMES A DAY  2   No current facility-administered medications on file prior to visit.   No Known Allergies  Labs: HEMOGLOBIN A1C- No recent lab found/available   Objective: General: Patient is awake, alert, and oriented x 3 and in no acute distress.  Integument: Skin is warm, dry and supple bilateral. Nails are tender, long, thickened and  dystrophic with subungual debris, consistent with onychomycosis, 1-5 bilateral. No signs of infection. No open lesions or preulcerative lesions present bilateral. Remaining integument unremarkable.  Vasculature:  Dorsalis Pedis pulse 0/4 bilateral. Posterior Tibial  pulse  0/4 bilateral.  Capillary fill time <5 sec 1-5 bilateral. No hair growth to the level of the digits. Temperature gradient within normal limits. No varicosities present bilateral. No edema present bilateral.   Neurology: The patient has diminished sensation measured with a 5.07/10g Semmes Weinstein Monofilament at all pedal sites bilateral . Vibratory sensation absent bilateral with tuning fork. No Babinski sign present bilateral.   Musculoskeletal: Old scar bunionectomy with residual deformity on right, Hammertoes 2-5 noted bilateral. Muscular strength 5/5 in all lower extremity muscular groups bilateral without pain or limitation on range of motion . No tenderness with calf compression bilateral.  Assessment and Plan: Problem List Items Addressed This Visit      Cardiovascular and Mediastinum   PAD (peripheral artery disease) ([place])     Endocrine   Type 2 diabetes, controlled, with neuropathy ([employer])     Musculoskeletal and Integument   Pain due to onychomycosis of toenail - Primary     -Examined patient. -Discussed and educated patient on diabetic foot care, especially with  regards to the vascular, neurological and musculoskeletal systems.  -Stressed the importance of good glycemic control and the detriment of not  controlling glucose levels in relation to the foot. -Recommend close medical and vascular follow up for preventive measures -Mechanically debrided all nails 1-5 bilateral using sterile nail nipper and filed with dremel without incident  -Answered all patient questions -Patient to return in 3 months for at risk foot care -Patient advised to call the office if any problems or questions arise in the  Meantime.  Landis Martins, DPM

## 2014-12-22 DIAGNOSIS — N186 End stage renal disease: Secondary | ICD-10-CM | POA: Diagnosis not present

## 2014-12-22 DIAGNOSIS — D631 Anemia in chronic kidney disease: Secondary | ICD-10-CM | POA: Diagnosis not present

## 2014-12-22 DIAGNOSIS — E1129 Type 2 diabetes mellitus with other diabetic kidney complication: Secondary | ICD-10-CM | POA: Diagnosis not present

## 2014-12-22 DIAGNOSIS — N2581 Secondary hyperparathyroidism of renal origin: Secondary | ICD-10-CM | POA: Diagnosis not present

## 2014-12-25 DIAGNOSIS — D631 Anemia in chronic kidney disease: Secondary | ICD-10-CM | POA: Diagnosis not present

## 2014-12-25 DIAGNOSIS — N186 End stage renal disease: Secondary | ICD-10-CM | POA: Diagnosis not present

## 2014-12-25 DIAGNOSIS — E1129 Type 2 diabetes mellitus with other diabetic kidney complication: Secondary | ICD-10-CM | POA: Diagnosis not present

## 2014-12-25 DIAGNOSIS — N2581 Secondary hyperparathyroidism of renal origin: Secondary | ICD-10-CM | POA: Diagnosis not present

## 2014-12-26 DIAGNOSIS — M1711 Unilateral primary osteoarthritis, right knee: Secondary | ICD-10-CM | POA: Diagnosis not present

## 2014-12-27 DIAGNOSIS — N186 End stage renal disease: Secondary | ICD-10-CM | POA: Diagnosis not present

## 2014-12-27 DIAGNOSIS — D631 Anemia in chronic kidney disease: Secondary | ICD-10-CM | POA: Diagnosis not present

## 2014-12-27 DIAGNOSIS — N2581 Secondary hyperparathyroidism of renal origin: Secondary | ICD-10-CM | POA: Diagnosis not present

## 2014-12-27 DIAGNOSIS — E1129 Type 2 diabetes mellitus with other diabetic kidney complication: Secondary | ICD-10-CM | POA: Diagnosis not present

## 2014-12-29 DIAGNOSIS — D631 Anemia in chronic kidney disease: Secondary | ICD-10-CM | POA: Diagnosis not present

## 2014-12-29 DIAGNOSIS — N2581 Secondary hyperparathyroidism of renal origin: Secondary | ICD-10-CM | POA: Diagnosis not present

## 2014-12-29 DIAGNOSIS — E1129 Type 2 diabetes mellitus with other diabetic kidney complication: Secondary | ICD-10-CM | POA: Diagnosis not present

## 2014-12-29 DIAGNOSIS — N186 End stage renal disease: Secondary | ICD-10-CM | POA: Diagnosis not present

## 2014-12-31 DIAGNOSIS — I10 Essential (primary) hypertension: Secondary | ICD-10-CM | POA: Diagnosis not present

## 2014-12-31 DIAGNOSIS — M25561 Pain in right knee: Secondary | ICD-10-CM | POA: Diagnosis not present

## 2014-12-31 DIAGNOSIS — E785 Hyperlipidemia, unspecified: Secondary | ICD-10-CM | POA: Diagnosis not present

## 2014-12-31 DIAGNOSIS — E1121 Type 2 diabetes mellitus with diabetic nephropathy: Secondary | ICD-10-CM | POA: Diagnosis not present

## 2014-12-31 DIAGNOSIS — R0602 Shortness of breath: Secondary | ICD-10-CM | POA: Diagnosis not present

## 2015-01-01 DIAGNOSIS — E1129 Type 2 diabetes mellitus with other diabetic kidney complication: Secondary | ICD-10-CM | POA: Diagnosis not present

## 2015-01-01 DIAGNOSIS — N186 End stage renal disease: Secondary | ICD-10-CM | POA: Diagnosis not present

## 2015-01-01 DIAGNOSIS — D631 Anemia in chronic kidney disease: Secondary | ICD-10-CM | POA: Diagnosis not present

## 2015-01-01 DIAGNOSIS — N2581 Secondary hyperparathyroidism of renal origin: Secondary | ICD-10-CM | POA: Diagnosis not present

## 2015-01-03 DIAGNOSIS — N186 End stage renal disease: Secondary | ICD-10-CM | POA: Diagnosis not present

## 2015-01-03 DIAGNOSIS — E1129 Type 2 diabetes mellitus with other diabetic kidney complication: Secondary | ICD-10-CM | POA: Diagnosis not present

## 2015-01-03 DIAGNOSIS — N2581 Secondary hyperparathyroidism of renal origin: Secondary | ICD-10-CM | POA: Diagnosis not present

## 2015-01-03 DIAGNOSIS — D631 Anemia in chronic kidney disease: Secondary | ICD-10-CM | POA: Diagnosis not present

## 2015-01-05 DIAGNOSIS — D631 Anemia in chronic kidney disease: Secondary | ICD-10-CM | POA: Diagnosis not present

## 2015-01-05 DIAGNOSIS — E1129 Type 2 diabetes mellitus with other diabetic kidney complication: Secondary | ICD-10-CM | POA: Diagnosis not present

## 2015-01-05 DIAGNOSIS — N186 End stage renal disease: Secondary | ICD-10-CM | POA: Diagnosis not present

## 2015-01-05 DIAGNOSIS — N2581 Secondary hyperparathyroidism of renal origin: Secondary | ICD-10-CM | POA: Diagnosis not present

## 2015-01-08 DIAGNOSIS — D631 Anemia in chronic kidney disease: Secondary | ICD-10-CM | POA: Diagnosis not present

## 2015-01-08 DIAGNOSIS — N186 End stage renal disease: Secondary | ICD-10-CM | POA: Diagnosis not present

## 2015-01-08 DIAGNOSIS — N2581 Secondary hyperparathyroidism of renal origin: Secondary | ICD-10-CM | POA: Diagnosis not present

## 2015-01-08 DIAGNOSIS — E1129 Type 2 diabetes mellitus with other diabetic kidney complication: Secondary | ICD-10-CM | POA: Diagnosis not present

## 2015-01-10 DIAGNOSIS — E1129 Type 2 diabetes mellitus with other diabetic kidney complication: Secondary | ICD-10-CM | POA: Diagnosis not present

## 2015-01-10 DIAGNOSIS — N186 End stage renal disease: Secondary | ICD-10-CM | POA: Diagnosis not present

## 2015-01-10 DIAGNOSIS — D631 Anemia in chronic kidney disease: Secondary | ICD-10-CM | POA: Diagnosis not present

## 2015-01-10 DIAGNOSIS — N2581 Secondary hyperparathyroidism of renal origin: Secondary | ICD-10-CM | POA: Diagnosis not present

## 2015-01-12 DIAGNOSIS — N2581 Secondary hyperparathyroidism of renal origin: Secondary | ICD-10-CM | POA: Diagnosis not present

## 2015-01-12 DIAGNOSIS — N186 End stage renal disease: Secondary | ICD-10-CM | POA: Diagnosis not present

## 2015-01-12 DIAGNOSIS — D631 Anemia in chronic kidney disease: Secondary | ICD-10-CM | POA: Diagnosis not present

## 2015-01-12 DIAGNOSIS — E1129 Type 2 diabetes mellitus with other diabetic kidney complication: Secondary | ICD-10-CM | POA: Diagnosis not present

## 2015-01-14 DIAGNOSIS — N186 End stage renal disease: Secondary | ICD-10-CM | POA: Diagnosis not present

## 2015-01-14 DIAGNOSIS — E1129 Type 2 diabetes mellitus with other diabetic kidney complication: Secondary | ICD-10-CM | POA: Diagnosis not present

## 2015-01-14 DIAGNOSIS — Z992 Dependence on renal dialysis: Secondary | ICD-10-CM | POA: Diagnosis not present

## 2015-01-15 DIAGNOSIS — N186 End stage renal disease: Secondary | ICD-10-CM | POA: Diagnosis not present

## 2015-01-15 DIAGNOSIS — D631 Anemia in chronic kidney disease: Secondary | ICD-10-CM | POA: Diagnosis not present

## 2015-01-15 DIAGNOSIS — N2581 Secondary hyperparathyroidism of renal origin: Secondary | ICD-10-CM | POA: Diagnosis not present

## 2015-01-17 ENCOUNTER — Encounter: Payer: Self-pay | Admitting: Sports Medicine

## 2015-01-17 ENCOUNTER — Ambulatory Visit (INDEPENDENT_AMBULATORY_CARE_PROVIDER_SITE_OTHER): Payer: Medicare Other | Admitting: Sports Medicine

## 2015-01-17 DIAGNOSIS — M79676 Pain in unspecified toe(s): Secondary | ICD-10-CM

## 2015-01-17 DIAGNOSIS — B353 Tinea pedis: Secondary | ICD-10-CM | POA: Diagnosis not present

## 2015-01-17 DIAGNOSIS — I739 Peripheral vascular disease, unspecified: Secondary | ICD-10-CM

## 2015-01-17 DIAGNOSIS — E113593 Type 2 diabetes mellitus with proliferative diabetic retinopathy without macular edema, bilateral: Secondary | ICD-10-CM | POA: Diagnosis not present

## 2015-01-17 DIAGNOSIS — N186 End stage renal disease: Secondary | ICD-10-CM | POA: Diagnosis not present

## 2015-01-17 DIAGNOSIS — D631 Anemia in chronic kidney disease: Secondary | ICD-10-CM | POA: Diagnosis not present

## 2015-01-17 DIAGNOSIS — L608 Other nail disorders: Secondary | ICD-10-CM

## 2015-01-17 DIAGNOSIS — N2581 Secondary hyperparathyroidism of renal origin: Secondary | ICD-10-CM | POA: Diagnosis not present

## 2015-01-17 DIAGNOSIS — E1140 Type 2 diabetes mellitus with diabetic neuropathy, unspecified: Secondary | ICD-10-CM | POA: Diagnosis not present

## 2015-01-17 MED ORDER — KETOCONAZOLE 2 % EX CREA: 1.0000 "application " | TOPICAL_CREAM | Freq: Every day | CUTANEOUS | Status: DC

## 2015-01-17 NOTE — Progress Notes (Signed)
Patient ID: Stefanie [name], female   DOB: 29-Mar-1945, 69 y.o.   MRN: [id number]  Subjective: Stefanie [name] is a 69 y.o. female patient with history of PAD, ESRD on HD, type 2 diabetes who returns to office today complaining of left big toe nail; report that part of the nail came off. Patient states that she noticed it a few days ago and decided to come in to have it checked; Patient is assisted by nursing aid who reports that the glucose reading this morning was not recorded. Patient denies any new changes in medication or new problems. Patient denies any new cramping, numbness, burning or tingling in the legs.  Patient Active Problem List   Diagnosis Date Noted  . Type 2 diabetes, controlled, with neuropathy ([place]) 12/20/2014  . Pain due to onychomycosis of toenail 12/20/2014  . 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 Mechanical complication of other vascular device, implant, and graft 05/19/2013  . PAD (peripheral artery disease) ([place]) 02/24/2012  . Murmur 12/24/2011  . Bradycardia-intermittent sinus 07/10/2010  . DYSPNEA ON EXERTION 02/25/2010  . CHEST PAIN, EXERTIONAL 02/25/2010  . DM 02/24/2010  . PROTEIN MALNUTRITION 02/24/2010  . HYPERLIPIDEMIA 02/24/2010  . ANEMIA 02/24/2010  . HYPERTENSION 02/24/2010  . CVA 02/24/2010  . ESRD on dialysis ([place]) 02/24/2010  . SECONDARY HYPERPARATHYROIDISM 02/24/2010    Current Outpatient Prescriptions on File Prior to Visit  Medication Sig Dispense Refill  . azithromycin (ZITHROMAX) 250 MG tablet     . calcium acetate (ELIPHOS) 667 MG tablet Take 667 mg by mouth 3 (three) times daily with meals. Take 3 capsule in the morning, 2 capsules with a snack and 3 capsules in the evening.    . calcium acetate (PHOSLO) 667 MG capsule     . celecoxib (CELEBREX) 200 MG capsule Take 200 mg by mouth daily as needed.  1  . cinacalcet (SENSIPAR) 30 MG tablet Take 30 mg by mouth at bedtime.    . clopidogrel (PLAVIX) 75 MG tablet Take 1 tablet (75 mg total)  by mouth daily. 30 tablet 11  . dicyclomine (BENTYL) 20 MG tablet   0  . diphenoxylate-atropine (LOMOTIL) 2.5-0.025 MG per tablet   0  . folic acid-vitamin b complex-vitamin c-selenium-zinc (DIALYVITE) 3 MG TABS tablet Take 1 tablet by mouth daily.    Marland Kitchen gabapentin (NEURONTIN) 300 MG capsule Take 300 mg by mouth at bedtime.    Marland Kitchen HUMALOG MIX 75/25 KWIKPEN (75-25) 100 UNIT/ML Kwikpen Inject 12-14 Units into the skin 2 (two) times daily. Inject 14 units every morning and 12 units every evening    . levofloxacin (LEVAQUIN) 750 MG tablet   0  . lisinopril (PRINIVIL,ZESTRIL) 20 MG tablet Take 20 mg by mouth daily.     Marland Kitchen ofloxacin (OCUFLOX) 0.3 % ophthalmic solution     . omeprazole (PRILOSEC) 40 MG capsule TAKE 1 CAPSULE BY MOUTH DAILY BEFORE A MEAL  0  . ondansetron (ZOFRAN) 4 MG tablet   0  . pravastatin (PRAVACHOL) 40 MG tablet Take 40 mg by mouth daily.     Marland Kitchen PROAIR HFA 108 (90 BASE) MCG/ACT inhaler Inhale 1 puff into the lungs every 4 (four) hours as needed for wheezing or shortness of breath.     Marland Kitchen rOPINIRole (REQUIP) 0.25 MG tablet   3  . rOPINIRole (REQUIP) 0.5 MG tablet Take 0.5 mg by mouth at bedtime.  12  . SENSIPAR 60 MG tablet     . silver sulfADIAZINE (SILVADENE) 1 % cream APPLY  A 1/16 INCH (1.5 MM) THICK LAYER TO ENTIRE BURN AREA BY TOPICAL ROUTE ONCE DAILY  0  . VOLTAREN 1 % GEL APPLY 2 GRAMS TO AREA UP TO 4 TIMES A DAY  2   No current facility-administered medications on file prior to visit.   No Known Allergies  Labs: HEMOGLOBIN A1C- No recent lab found/available   Objective: General: Patient is awake, alert, and oriented x 3 and in no acute distress.  Integument: Skin is warm, dry and supple bilateral. Left hallux nail partially detached with dry subungal blood in the center encompassing about 20% of the nail with No signs of infection. All other nails are short and thickened with no acute changes. No open lesions or preulcerative lesions present bilateral. There is plantar  xerosis in a annular fashion concerning for probable tinea. Remaining integument unremarkable.  Vasculature:  Dorsalis Pedis pulse 0/4 bilateral. Posterior Tibial pulse  0/4 bilateral.  Capillary fill time <5 sec 1-5 bilateral. No hair growth to the level of the digits. Temperature gradient within normal limits. No varicosities present bilateral. No edema present bilateral.   Neurology: The patient has diminished sensation measured with a 5.07/10g Semmes Weinstein Monofilament at all pedal sites bilateral. Vibratory sensation absent bilateral with tuning fork. No Babinski sign present bilateral.   Musculoskeletal: Old scar bunionectomy with residual deformity on right, Hammertoes 2-5 noted bilateral. Muscular strength 5/5 in all lower extremity muscular groups bilateral without pain or limitation on range of motion . No tenderness with calf compression bilateral.  Assessment and Plan: Problem List Items Addressed This Visit      Cardiovascular and Mediastinum   PAD (peripheral artery disease) ([place])     Endocrine   Type 2 diabetes, controlled, with neuropathy ([place])    Other Visit Diagnoses    Nail splinter hemorrhage    -  Primary    Left hallux (onycholysis with dry blood), no signs of infection     Tinea pedis, unspecified laterality        Relevant Medications    ketoconazole (NIZORAL) 2 % cream    Pain of toe, unspecified laterality          -Examined patient. -Discussed and educated patient on diabetic foot care, especially with  regards to the vascular, neurological and musculoskeletal systems.  -Stressed the importance of good glycemic control and the detriment of not  controlling glucose levels in relation to the foot. -Recommend close medical and vascular follow up for preventive measures -Mechanically debrided left hallux nail using sterile nail nipper and filed with dremel without incident and applied topical antibiotic cream and bandaid; advised closed monitoring of nail  and protection of toes.  -Rx Ketoconazole cream for tinea  -Answered all patient questions -Patient to return as scheduled for at risk foot care -Patient advised to call the office if any problems or questions arise in the  Meantime.  Landis Martins, DPM

## 2015-01-19 DIAGNOSIS — D631 Anemia in chronic kidney disease: Secondary | ICD-10-CM | POA: Diagnosis not present

## 2015-01-19 DIAGNOSIS — N2581 Secondary hyperparathyroidism of renal origin: Secondary | ICD-10-CM | POA: Diagnosis not present

## 2015-01-19 DIAGNOSIS — N186 End stage renal disease: Secondary | ICD-10-CM | POA: Diagnosis not present

## 2015-01-22 DIAGNOSIS — D631 Anemia in chronic kidney disease: Secondary | ICD-10-CM | POA: Diagnosis not present

## 2015-01-22 DIAGNOSIS — N2581 Secondary hyperparathyroidism of renal origin: Secondary | ICD-10-CM | POA: Diagnosis not present

## 2015-01-22 DIAGNOSIS — N186 End stage renal disease: Secondary | ICD-10-CM | POA: Diagnosis not present

## 2015-01-23 DIAGNOSIS — H3321 Serous retinal detachment, right eye: Secondary | ICD-10-CM | POA: Diagnosis not present

## 2015-01-23 DIAGNOSIS — H401123 Primary open-angle glaucoma, left eye, severe stage: Secondary | ICD-10-CM | POA: Diagnosis not present

## 2015-01-23 DIAGNOSIS — H401114 Primary open-angle glaucoma, right eye, indeterminate stage: Secondary | ICD-10-CM | POA: Diagnosis not present

## 2015-01-24 DIAGNOSIS — N2581 Secondary hyperparathyroidism of renal origin: Secondary | ICD-10-CM | POA: Diagnosis not present

## 2015-01-24 DIAGNOSIS — N186 End stage renal disease: Secondary | ICD-10-CM | POA: Diagnosis not present

## 2015-01-24 DIAGNOSIS — D631 Anemia in chronic kidney disease: Secondary | ICD-10-CM | POA: Diagnosis not present

## 2015-01-24 DIAGNOSIS — M1711 Unilateral primary osteoarthritis, right knee: Secondary | ICD-10-CM | POA: Diagnosis not present

## 2015-01-26 DIAGNOSIS — D631 Anemia in chronic kidney disease: Secondary | ICD-10-CM | POA: Diagnosis not present

## 2015-01-26 DIAGNOSIS — N186 End stage renal disease: Secondary | ICD-10-CM | POA: Diagnosis not present

## 2015-01-26 DIAGNOSIS — N2581 Secondary hyperparathyroidism of renal origin: Secondary | ICD-10-CM | POA: Diagnosis not present

## 2015-01-28 DIAGNOSIS — N185 Chronic kidney disease, stage 5: Secondary | ICD-10-CM | POA: Diagnosis not present

## 2015-01-28 DIAGNOSIS — R0602 Shortness of breath: Secondary | ICD-10-CM | POA: Diagnosis not present

## 2015-01-28 DIAGNOSIS — I10 Essential (primary) hypertension: Secondary | ICD-10-CM | POA: Diagnosis not present

## 2015-01-28 DIAGNOSIS — E1121 Type 2 diabetes mellitus with diabetic nephropathy: Secondary | ICD-10-CM | POA: Diagnosis not present

## 2015-01-29 DIAGNOSIS — N186 End stage renal disease: Secondary | ICD-10-CM | POA: Diagnosis not present

## 2015-01-29 DIAGNOSIS — D631 Anemia in chronic kidney disease: Secondary | ICD-10-CM | POA: Diagnosis not present

## 2015-01-29 DIAGNOSIS — N2581 Secondary hyperparathyroidism of renal origin: Secondary | ICD-10-CM | POA: Diagnosis not present

## 2015-01-30 DIAGNOSIS — E113593 Type 2 diabetes mellitus with proliferative diabetic retinopathy without macular edema, bilateral: Secondary | ICD-10-CM | POA: Diagnosis not present

## 2015-01-30 DIAGNOSIS — Q998 Other specified chromosome abnormalities: Secondary | ICD-10-CM | POA: Diagnosis not present

## 2015-01-31 DIAGNOSIS — N186 End stage renal disease: Secondary | ICD-10-CM | POA: Diagnosis not present

## 2015-01-31 DIAGNOSIS — N2581 Secondary hyperparathyroidism of renal origin: Secondary | ICD-10-CM | POA: Diagnosis not present

## 2015-01-31 DIAGNOSIS — D631 Anemia in chronic kidney disease: Secondary | ICD-10-CM | POA: Diagnosis not present

## 2015-02-01 ENCOUNTER — Inpatient Hospital Stay (HOSPITAL_COMMUNITY)
Admission: AD | Admit: 2015-02-01 | Discharge: 2015-02-08 | DRG: 252 | Disposition: A | Discharge status: Rehabilitation Facility | Payer: Medicare Other | Source: Other Acute Inpatient Hospital | Attending: Internal Medicine | Admitting: Internal Medicine

## 2015-02-01 DIAGNOSIS — Z8249 Family history of ischemic heart disease and other diseases of the circulatory system: Secondary | ICD-10-CM | POA: Diagnosis not present

## 2015-02-01 DIAGNOSIS — R05 Cough: Secondary | ICD-10-CM | POA: Diagnosis not present

## 2015-02-01 DIAGNOSIS — I25119 Atherosclerotic heart disease of native coronary artery with unspecified angina pectoris: Secondary | ICD-10-CM | POA: Diagnosis present

## 2015-02-01 DIAGNOSIS — D649 Anemia, unspecified: Secondary | ICD-10-CM | POA: Diagnosis not present

## 2015-02-01 DIAGNOSIS — Z794 Long term (current) use of insulin: Secondary | ICD-10-CM | POA: Diagnosis not present

## 2015-02-01 DIAGNOSIS — D638 Anemia in other chronic diseases classified elsewhere: Secondary | ICD-10-CM | POA: Diagnosis present

## 2015-02-01 DIAGNOSIS — E785 Hyperlipidemia, unspecified: Secondary | ICD-10-CM | POA: Diagnosis present

## 2015-02-01 DIAGNOSIS — M7989 Other specified soft tissue disorders: Secondary | ICD-10-CM | POA: Diagnosis not present

## 2015-02-01 DIAGNOSIS — I120 Hypertensive chronic kidney disease with stage 5 chronic kidney disease or end stage renal disease: Secondary | ICD-10-CM | POA: Diagnosis present

## 2015-02-01 DIAGNOSIS — F329 Major depressive disorder, single episode, unspecified: Secondary | ICD-10-CM | POA: Diagnosis present

## 2015-02-01 DIAGNOSIS — Z992 Dependence on renal dialysis: Secondary | ICD-10-CM

## 2015-02-01 DIAGNOSIS — R0602 Shortness of breath: Secondary | ICD-10-CM | POA: Diagnosis not present

## 2015-02-01 DIAGNOSIS — E1122 Type 2 diabetes mellitus with diabetic chronic kidney disease: Secondary | ICD-10-CM | POA: Diagnosis present

## 2015-02-01 DIAGNOSIS — N186 End stage renal disease: Secondary | ICD-10-CM | POA: Diagnosis present

## 2015-02-01 DIAGNOSIS — I2510 Atherosclerotic heart disease of native coronary artery without angina pectoris: Secondary | ICD-10-CM | POA: Diagnosis not present

## 2015-02-01 DIAGNOSIS — I5032 Chronic diastolic (congestive) heart failure: Secondary | ICD-10-CM | POA: Diagnosis not present

## 2015-02-01 DIAGNOSIS — I6350 Cerebral infarction due to unspecified occlusion or stenosis of unspecified cerebral artery: Secondary | ICD-10-CM | POA: Diagnosis present

## 2015-02-01 DIAGNOSIS — R778 Other specified abnormalities of plasma proteins: Secondary | ICD-10-CM | POA: Diagnosis present

## 2015-02-01 DIAGNOSIS — R7989 Other specified abnormal findings of blood chemistry: Secondary | ICD-10-CM | POA: Diagnosis present

## 2015-02-01 DIAGNOSIS — Z833 Family history of diabetes mellitus: Secondary | ICD-10-CM

## 2015-02-01 DIAGNOSIS — T82898A Other specified complication of vascular prosthetic devices, implants and grafts, initial encounter: Secondary | ICD-10-CM | POA: Diagnosis not present

## 2015-02-01 DIAGNOSIS — I10 Essential (primary) hypertension: Secondary | ICD-10-CM | POA: Diagnosis present

## 2015-02-01 DIAGNOSIS — Z79899 Other long term (current) drug therapy: Secondary | ICD-10-CM

## 2015-02-01 DIAGNOSIS — E1140 Type 2 diabetes mellitus with diabetic neuropathy, unspecified: Secondary | ICD-10-CM | POA: Diagnosis present

## 2015-02-01 DIAGNOSIS — I82402 Acute embolism and thrombosis of unspecified deep veins of left lower extremity: Secondary | ICD-10-CM | POA: Diagnosis not present

## 2015-02-01 DIAGNOSIS — K5900 Constipation, unspecified: Secondary | ICD-10-CM | POA: Diagnosis present

## 2015-02-01 DIAGNOSIS — E7800 Pure hypercholesterolemia, unspecified: Secondary | ICD-10-CM | POA: Diagnosis not present

## 2015-02-01 DIAGNOSIS — I5023 Acute on chronic systolic (congestive) heart failure: Secondary | ICD-10-CM | POA: Diagnosis present

## 2015-02-01 DIAGNOSIS — I82412 Acute embolism and thrombosis of left femoral vein: Principal | ICD-10-CM | POA: Diagnosis present

## 2015-02-01 DIAGNOSIS — T82856A Stenosis of peripheral vascular stent, initial encounter: Secondary | ICD-10-CM | POA: Diagnosis present

## 2015-02-01 DIAGNOSIS — I420 Dilated cardiomyopathy: Secondary | ICD-10-CM | POA: Insufficient documentation

## 2015-02-01 DIAGNOSIS — D696 Thrombocytopenia, unspecified: Secondary | ICD-10-CM | POA: Diagnosis not present

## 2015-02-01 DIAGNOSIS — R5381 Other malaise: Secondary | ICD-10-CM | POA: Diagnosis not present

## 2015-02-01 DIAGNOSIS — I6930 Unspecified sequelae of cerebral infarction: Secondary | ICD-10-CM | POA: Diagnosis not present

## 2015-02-01 DIAGNOSIS — I509 Heart failure, unspecified: Secondary | ICD-10-CM | POA: Diagnosis not present

## 2015-02-01 DIAGNOSIS — T82818A Embolism of vascular prosthetic devices, implants and grafts, initial encounter: Secondary | ICD-10-CM | POA: Diagnosis not present

## 2015-02-01 DIAGNOSIS — Z792 Long term (current) use of antibiotics: Secondary | ICD-10-CM | POA: Diagnosis not present

## 2015-02-01 DIAGNOSIS — D6949 Other primary thrombocytopenia: Secondary | ICD-10-CM | POA: Diagnosis present

## 2015-02-01 DIAGNOSIS — R079 Chest pain, unspecified: Secondary | ICD-10-CM | POA: Diagnosis not present

## 2015-02-01 DIAGNOSIS — Z8673 Personal history of transient ischemic attack (TIA), and cerebral infarction without residual deficits: Secondary | ICD-10-CM | POA: Diagnosis not present

## 2015-02-01 DIAGNOSIS — N2581 Secondary hyperparathyroidism of renal origin: Secondary | ICD-10-CM | POA: Diagnosis present

## 2015-02-01 DIAGNOSIS — K219 Gastro-esophageal reflux disease without esophagitis: Secondary | ICD-10-CM | POA: Diagnosis present

## 2015-02-01 DIAGNOSIS — I739 Peripheral vascular disease, unspecified: Secondary | ICD-10-CM | POA: Diagnosis present

## 2015-02-01 DIAGNOSIS — E119 Type 2 diabetes mellitus without complications: Secondary | ICD-10-CM | POA: Diagnosis not present

## 2015-02-01 DIAGNOSIS — I82592 Chronic embolism and thrombosis of other specified deep vein of left lower extremity: Secondary | ICD-10-CM | POA: Diagnosis not present

## 2015-02-01 DIAGNOSIS — H3323 Serous retinal detachment, bilateral: Secondary | ICD-10-CM | POA: Diagnosis present

## 2015-02-01 DIAGNOSIS — M79652 Pain in left thigh: Secondary | ICD-10-CM | POA: Diagnosis not present

## 2015-02-01 DIAGNOSIS — Z7902 Long term (current) use of antithrombotics/antiplatelets: Secondary | ICD-10-CM | POA: Diagnosis not present

## 2015-02-01 DIAGNOSIS — Y832 Surgical operation with anastomosis, bypass or graft as the cause of abnormal reaction of the patient, or of later complication, without mention of misadventure at the time of the procedure: Secondary | ICD-10-CM | POA: Diagnosis not present

## 2015-02-01 DIAGNOSIS — R0600 Dyspnea, unspecified: Secondary | ICD-10-CM | POA: Diagnosis not present

## 2015-02-01 DIAGNOSIS — M1711 Unilateral primary osteoarthritis, right knee: Secondary | ICD-10-CM | POA: Diagnosis not present

## 2015-02-01 DIAGNOSIS — Z87891 Personal history of nicotine dependence: Secondary | ICD-10-CM

## 2015-02-01 DIAGNOSIS — I5043 Acute on chronic combined systolic (congestive) and diastolic (congestive) heart failure: Secondary | ICD-10-CM | POA: Diagnosis not present

## 2015-02-01 DIAGNOSIS — D72829 Elevated white blood cell count, unspecified: Secondary | ICD-10-CM | POA: Diagnosis present

## 2015-02-01 DIAGNOSIS — M79605 Pain in left leg: Secondary | ICD-10-CM | POA: Diagnosis not present

## 2015-02-01 LAB — GLUCOSE, CAPILLARY: Glucose-Capillary: 168 mg/dL — ABNORMAL HIGH (ref 65–99)

## 2015-02-01 MED ORDER — MORPHINE SULFATE (PF) 2 MG/ML IV SOLN: 2.0000 mg | INTRAVENOUS | Status: DC | PRN

## 2015-02-01 MED ORDER — CELECOXIB 200 MG PO CAPS
200.0000 mg | ORAL_CAPSULE | Freq: Every day | ORAL | Status: DC | PRN
  Filled 2015-02-01: qty 1

## 2015-02-01 MED ORDER — CLOPIDOGREL BISULFATE 75 MG PO TABS
75.0000 mg | ORAL_TABLET | Freq: Every day | ORAL | Status: DC
  Administered 2015-02-02 – 2015-02-08 (×7): 75 mg via ORAL
  Filled 2015-02-01 (×8): qty 1

## 2015-02-01 MED ORDER — OFLOXACIN 0.3 % OP SOLN
1.0000 [drp] | Freq: Four times a day (QID) | OPHTHALMIC | Status: DC
  Administered 2015-02-02 – 2015-02-08 (×17): 1 [drp] via OPHTHALMIC
  Filled 2015-02-01 (×2): qty 5

## 2015-02-01 MED ORDER — RENA-VITE PO TABS
1.0000 | ORAL_TABLET | Freq: Every day | ORAL | Status: DC
  Administered 2015-02-02 – 2015-02-07 (×7): 1 via ORAL
  Filled 2015-02-01 (×7): qty 1

## 2015-02-01 MED ORDER — LISINOPRIL 20 MG PO TABS
20.0000 mg | ORAL_TABLET | Freq: Every day | ORAL | Status: DC
  Administered 2015-02-02 – 2015-02-08 (×7): 20 mg via ORAL
  Filled 2015-02-01 (×6): qty 1
  Filled 2015-02-01: qty 2

## 2015-02-01 MED ORDER — ONDANSETRON HCL 4 MG PO TABS: 4.0000 mg | ORAL_TABLET | Freq: Four times a day (QID) | ORAL | Status: DC | PRN

## 2015-02-01 MED ORDER — CALCIUM ACETATE (PHOS BINDER) 667 MG PO CAPS
667.0000 mg | ORAL_CAPSULE | Freq: Three times a day (TID) | ORAL | Status: DC
  Administered 2015-02-02 – 2015-02-08 (×16): 667 mg via ORAL
  Filled 2015-02-01 (×17): qty 1

## 2015-02-01 MED ORDER — KETOCONAZOLE 2 % EX CREA
1.0000 "application " | TOPICAL_CREAM | Freq: Every day | CUTANEOUS | Status: DC
  Administered 2015-02-03 – 2015-02-08 (×6): 1 via TOPICAL
  Filled 2015-02-01: qty 15

## 2015-02-01 MED ORDER — ALBUTEROL SULFATE (2.5 MG/3ML) 0.083% IN NEBU
2.5000 mg | INHALATION_SOLUTION | RESPIRATORY_TRACT | Status: DC | PRN
  Administered 2015-02-05: 2.5 mg via RESPIRATORY_TRACT
  Filled 2015-02-01: qty 3

## 2015-02-01 MED ORDER — SODIUM CHLORIDE 0.9 % IJ SOLN
3.0000 mL | Freq: Two times a day (BID) | INTRAMUSCULAR | Status: DC
  Administered 2015-02-01 – 2015-02-08 (×8): 3 mL via INTRAVENOUS

## 2015-02-01 MED ORDER — DICLOFENAC SODIUM 1 % TD GEL: 2.0000 g | Freq: Four times a day (QID) | TRANSDERMAL | Status: DC | PRN

## 2015-02-01 MED ORDER — HEPARIN (PORCINE) IN NACL 100-0.45 UNIT/ML-% IJ SOLN
1200.0000 [IU]/h | INTRAMUSCULAR | Status: DC
  Administered 2015-02-03: 1200 [IU]/h via INTRAVENOUS
  Filled 2015-02-01 (×2): qty 250

## 2015-02-01 MED ORDER — PRAVASTATIN SODIUM 40 MG PO TABS
40.0000 mg | ORAL_TABLET | Freq: Every day | ORAL | Status: DC
  Administered 2015-02-02 – 2015-02-08 (×7): 40 mg via ORAL
  Filled 2015-02-01 (×7): qty 1

## 2015-02-01 MED ORDER — NITROGLYCERIN 0.4 MG SL SUBL: 0.4000 mg | SUBLINGUAL_TABLET | SUBLINGUAL | Status: DC | PRN

## 2015-02-01 MED ORDER — GABAPENTIN 300 MG PO CAPS
300.0000 mg | ORAL_CAPSULE | Freq: Every day | ORAL | Status: DC
  Administered 2015-02-02 – 2015-02-07 (×7): 300 mg via ORAL
  Filled 2015-02-01 (×7): qty 1

## 2015-02-01 MED ORDER — ROPINIROLE HCL 0.5 MG PO TABS
0.5000 mg | ORAL_TABLET | Freq: Every day | ORAL | Status: DC
  Administered 2015-02-02 – 2015-02-03 (×3): 0.5 mg via ORAL
  Administered 2015-02-04: 1 mg via ORAL
  Administered 2015-02-05 – 2015-02-07 (×3): 0.5 mg via ORAL
  Filled 2015-02-01 (×10): qty 1

## 2015-02-01 MED ORDER — DIALYVITE 3000 3 MG PO TABS: 1.0000 | ORAL_TABLET | Freq: Every day | ORAL | Status: DC

## 2015-02-01 MED ORDER — CALCIUM ACETATE (PHOS BINDER) 667 MG PO TABS: 667.0000 mg | ORAL_TABLET | Freq: Three times a day (TID) | ORAL | Status: DC

## 2015-02-01 MED ORDER — ONDANSETRON HCL 4 MG/2ML IJ SOLN: 4.0000 mg | Freq: Four times a day (QID) | INTRAMUSCULAR | Status: DC | PRN

## 2015-02-01 MED ORDER — PANTOPRAZOLE SODIUM 40 MG PO TBEC
40.0000 mg | DELAYED_RELEASE_TABLET | Freq: Every day | ORAL | Status: DC
  Administered 2015-02-02 – 2015-02-08 (×7): 40 mg via ORAL
  Filled 2015-02-01 (×7): qty 1

## 2015-02-01 MED ORDER — GUAIFENESIN ER 600 MG PO TB12
600.0000 mg | ORAL_TABLET | Freq: Two times a day (BID) | ORAL | Status: DC | PRN
  Administered 2015-02-05 – 2015-02-08 (×3): 600 mg via ORAL
  Filled 2015-02-01 (×5): qty 1

## 2015-02-01 MED ORDER — INSULIN ASPART 100 UNIT/ML ~~LOC~~ SOLN
0.0000 [IU] | Freq: Three times a day (TID) | SUBCUTANEOUS | Status: DC
  Administered 2015-02-02 – 2015-02-03 (×3): 2 [IU] via SUBCUTANEOUS
  Administered 2015-02-03: 3 [IU] via SUBCUTANEOUS
  Administered 2015-02-04: 2 [IU] via SUBCUTANEOUS
  Administered 2015-02-04: 3 [IU] via SUBCUTANEOUS
  Administered 2015-02-04: 2 [IU] via SUBCUTANEOUS
  Administered 2015-02-06: 3 [IU] via SUBCUTANEOUS
  Administered 2015-02-06: 1 [IU] via SUBCUTANEOUS
  Administered 2015-02-06: 18:00:00 5 [IU] via SUBCUTANEOUS
  Administered 2015-02-07 (×2): 2 [IU] via SUBCUTANEOUS
  Administered 2015-02-07: 5 [IU] via SUBCUTANEOUS

## 2015-02-01 MED ORDER — CINACALCET HCL 30 MG PO TABS
60.0000 mg | ORAL_TABLET | Freq: Every day | ORAL | Status: DC
  Administered 2015-02-02 – 2015-02-05 (×4): 60 mg via ORAL
  Filled 2015-02-01 (×4): qty 2

## 2015-02-01 NOTE — H&P (Signed)
[facility] History and Physical  Stefanie S Brownstein [id number] DOB: May 28, 1945 DOA: 02/01/2015  Referring physician: ED physician PCP: Bonnita Nasuti, MD  Specialists:   Chief Complaint: Left leg pain, shortness of breath  HPI: Stefanie [name] is a 69 y.o. female with PMH of ESRD-HD (TTS), stroke, CHF, CAD, retinal detachment, hypertension, hyperlipidemia, diabetes mellitus, gerd, depression, who presents with left leg pain and shortness of breath.,  Pt is transferred from [facility] due to lack of dialysis center per EDP, Dr. Graylon Good.  Patient reports that she has been having pain over her left leg for 2 days. Her pain is located in the medial side of the left leg, involving whole leg from thigh to lower leg. She also reports having shortness of breath. She denies chest pain currently, but reports that sometimes she has chest pain after walking for long distance. Patient does not have cough, fever or chills. Patient does not have abdominal pain, diarrhea. She still makes a little urine, but no symptoms of UTI. No unilateral weakness. Patient initially presented to [facility] emergency room, and was found to have left common femoral vein DVT by venous Doppler. CTA is negative for PE. Troponin is elevated at 0.12. Because of lack of dialysis center, patient is transferred to [place] per EDP, Dr. Graylon Good.   In ED, patient was found to have blood pressure 155/65, heart rate 73, oxygen saturation 95, temperature 98.9, WBC 12.4, potassium 3.7, bicarbonate 30, creatinine 6.6, BUN 29, trop 0.12. Patient is accepted to Tele bed. Nephrology was consulted by EDP, Dr. Justin Mend recommended to start heparin drip.   Where does patient live?   At home   Can patient participate in ADLs?  Yes    Review of Systems:   General: no fevers, chills, no changes in body weight, has poor appetite, has fatigue HEENT: no blurry vision, hearing changes or sore throat Pulm: has dyspnea, no coughing,  wheezing CV: has chest pain, palpitations Abd: no nausea, vomiting, abdominal pain, diarrhea, constipation GU: no dysuria, burning on urination, increased urinary frequency, hematuria  Ext: has left leg pain.  Neuro: no unilateral weakness, numbness, or tingling, no vision change or hearing loss Skin: no rash MSK: No muscle spasm, no deformity, no limitation of range of movement in spin Heme: No easy bruising.  Travel history: No recent long distant travel.  Allergy: No Known Allergies  Past Medical History  Diagnosis Date  . Hypertension   . Hyperlipidemia   . Diabetes mellitus   . CVA (cerebral vascular accident) ([place]) [place]    left sided weakness  . Protein malnutrition ([place])   . Secondary hyperparathyroidism ([place])   . Anemia   . End stage renal disease ([place])     initiated HD 2006  . Hx of echocardiogram     a. Echo 03/2010: EF 55-60%, Gr 1 diast dysfn, trivial MS, mild to mod LAE, PASP 34, ;  b. Echo 11/13: mild LVH, EF 60%, Gr 1 diast dysfn, Ao sclerosis, no AS, MAC, slight MS, mean 3 mmHg, PASP 34  . Hx of cardiovascular stress test     a. MV 03/2010:  no ischemia, EF 55%  . CHF (congestive heart failure) ([place])   . Retinal detachment, bilateral   . CAD (coronary artery disease)     Past Surgical History  Procedure Laterality Date  . Cataract extraction    . Tubal ligation    . Arteriovenous graft placement      BUE numerous times  .  Arteriovenous graft placement  11/13/10    Lt femoral - Dr. Kellie Simmering  . Dg av dialysis graft declot or  06/06/11, 08/26/11    performed in IR  . Shuntogram N/A 05/29/2013    Procedure: Earney Mallet;  Surgeon: Conrad Savanna, MD;  Location: [facility] CATH LAB;  Service: Cardiovascular;  Laterality: N/A;  . Shuntogram Left 03/01/2014    Procedure: SHUNTOGRAM;  Surgeon: Conrad Doyle, MD;  Location: [facility] CATH LAB;  Service: Cardiovascular;  Laterality: Left;    Social History:  reports that she quit smoking about 31 years ago. She has never used  smokeless tobacco. She reports that she does not drink alcohol or use illicit drugs.  Family History:  Family History  Problem Relation Age of Onset  . Cardiomyopathy    . Heart disease Mother   . Diabetes Mother   . Hypertension Mother   . Heart attack Mother   . Diabetes Other   . Kidney disease Other   . Diabetes Daughter   . Diabetes Son   . Hypertension Son      Prior to Admission medications   Medication Sig Start Date End Date Taking? Authorizing Provider  azithromycin (ZITHROMAX) 250 MG tablet  01/25/14   Historical Provider, MD  calcium acetate (ELIPHOS) 667 MG tablet Take 667 mg by mouth 3 (three) times daily with meals. Take 3 capsule in the morning, 2 capsules with a snack and 3 capsules in the evening.    Historical Provider, MD  calcium acetate (PHOSLO) 667 MG capsule  06/26/14   Historical Provider, MD  celecoxib (CELEBREX) 200 MG capsule Take 200 mg by mouth daily as needed. 10/30/14   Historical Provider, MD  cinacalcet (SENSIPAR) 30 MG tablet Take 30 mg by mouth at bedtime.    Historical Provider, MD  clopidogrel (PLAVIX) 75 MG tablet Take 1 tablet (75 mg total) by mouth daily. 03/01/14   Conrad Groves, MD  dicyclomine (BENTYL) 20 MG tablet  05/20/14   Historical Provider, MD  diphenoxylate-atropine (LOMOTIL) 2.5-0.025 MG per tablet  05/14/14   Historical Provider, MD  folic acid-vitamin b complex-vitamin c-selenium-zinc (DIALYVITE) 3 MG TABS tablet Take 1 tablet by mouth daily.    Historical Provider, MD  gabapentin (NEURONTIN) 300 MG capsule Take 300 mg by mouth at bedtime.    Historical Provider, MD  HUMALOG MIX 75/25 KWIKPEN (75-25) 100 UNIT/ML Kwikpen Inject 12-14 Units into the skin 2 (two) times daily. Inject 14 units every morning and 12 units every evening 01/08/14   Historical Provider, MD  ketoconazole (NIZORAL) 2 % cream Apply 1 application topically daily. 01/17/15   Landis Martins, DPM  levofloxacin (LEVAQUIN) 750 MG tablet  03/27/14   Historical Provider, MD   lisinopril (PRINIVIL,ZESTRIL) 20 MG tablet Take 20 mg by mouth daily.  02/17/14   Historical Provider, MD  ofloxacin (OCUFLOX) 0.3 % ophthalmic solution  04/30/14   Historical Provider, MD  omeprazole (PRILOSEC) 40 MG capsule TAKE 1 CAPSULE BY MOUTH DAILY BEFORE A MEAL 08/24/14   Historical Provider, MD  ondansetron (ZOFRAN) 4 MG tablet  05/20/14   Historical Provider, MD  pravastatin (PRAVACHOL) 40 MG tablet Take 40 mg by mouth daily.  02/11/14   Historical Provider, MD  PROAIR HFA 108 (90 BASE) MCG/ACT inhaler Inhale 1 puff into the lungs every 4 (four) hours as needed for wheezing or shortness of breath.  01/13/14   Historical Provider, MD  rOPINIRole (REQUIP) 0.25 MG tablet  06/08/14   Historical Provider, MD  rOPINIRole (  REQUIP) 0.5 MG tablet Take 0.5 mg by mouth at bedtime. 07/27/14   Historical Provider, MD  SENSIPAR 60 MG tablet  07/25/14   Historical Provider, MD  silver sulfADIAZINE (SILVADENE) 1 % cream APPLY A 1/16 INCH (1.5 MM) THICK LAYER TO ENTIRE BURN AREA BY TOPICAL ROUTE ONCE DAILY 10/19/14   Historical Provider, MD  VOLTAREN 1 % GEL APPLY 2 GRAMS TO AREA UP TO 4 TIMES A DAY 09/25/14   Historical Provider, MD    Physical Exam: Filed Vitals:   02/01/15 2143  BP: 132/48  Pulse: 73  Temp: 98.7 F (37.1 C)  TempSrc: Oral  Resp: 20  Height: 5\' 4"  (1.626 m)  Weight: 75.615 kg (166 lb 11.2 oz)  SpO2: 99%   General: Not in acute distress HEENT:       Eyes: PERRL, EOMI, no scleral icterus.       ENT: No discharge from the ears and nose, no pharynx injection, no tonsillar enlargement.        Neck: No JVD, no bruit, no mass felt. Heme: No neck lymph node enlargement. Cardiac: [id number], RRR, 3/6 systolic murmur, No gallops or rubs. Pulm: No rales, wheezing, rhonchi or rubs. Abd: Soft, nondistended, nontender, no rebound pain, no organomegaly, BS present. Ext: No pitting leg edema bilaterally. 2+DP/PT pulse bilaterally. Has tenderness over the medial side of left leg from thigh to lower  leg. No swelling. Has functioning AV fistula over left thigh Musculoskeletal: No joint deformities, No joint redness or warmth, no limitation of ROM in spin. Skin: No rashes.  Neuro: Alert, oriented X3, cranial nerves II-XII grossly intact, muscle strength 5/5 in all extremities, sensation to light touch intact.  Psych: Patient is not psychotic, no suicidal or hemocidal ideation.  Labs on Admission:  Basic Metabolic Panel: No results for input(s): NA, K, CL, CO2, GLUCOSE, BUN, CREATININE, CALCIUM, MG, PHOS in the last 168 hours. Liver Function Tests: No results for input(s): AST, ALT, ALKPHOS, BILITOT, PROT, ALBUMIN in the last 168 hours. No results for input(s): LIPASE, AMYLASE in the last 168 hours. No results for input(s): AMMONIA in the last 168 hours. CBC: No results for input(s): WBC, NEUTROABS, HGB, HCT, MCV, PLT in the last 168 hours. Cardiac Enzymes: No results for input(s): CKTOTAL, CKMB, CKMBINDEX, TROPONINI in the last 168 hours.  BNP (last 3 results) No results for input(s): BNP in the last 8760 hours.  ProBNP (last 3 results) No results for input(s): PROBNP in the last 8760 hours.  CBG:  Recent Labs Lab 02/01/15 2352  GLUCAP 168*    Radiological Exams on Admission: Dg Chest 2 View  02/02/2015  CLINICAL DATA:  Shortness of breath, cough, and weakness for 2 days. EXAM: CHEST  2 VIEW COMPARISON:  02/01/2015 FINDINGS: Moderate cardiomegaly and diffuse pulmonary vascular congestion remains stable. No evidence of acute infiltrate or pleural effusion. Short segment of wire again seen overlying the right clavicle. Endovascular stent seen in left axillary region. IMPRESSION: Stable cardiomegaly and pulmonary vascular congestion. No acute findings. Electronically Signed   By: Earle Gell M.D.   On: 02/02/2015 00:49    EKG: Independently reviewed. 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 Chart review revealed that she had hx of occluded common femoral vein and external iliac vein, and underwent stenting left common femoral and external internal vein 02/2014 by [facility], Dr. Bridgett Larsson. I spoke with Dr. Justin Mend on the phone, he recommended to start heparin drip and consult to [facility] in AM (no need to call [facility] tonight).  -will admit tele bed -start Heparin gtt -INR/PTT/type & screen -when necessary morphine for pain -Please consult [facility] in morning  SOB: etiology is not clear. No PE by CTA. Patient does not have cough, less likely to have pneumonia. She does not seem to have fluid overload. Patient reports having intermittent exertional chest pain on walking for long distance, pointiong to the cardiac issue, such as angina or CHF. She has hx of CAD. Her troponin is mildly elevated at 0.12 on admission, which could be partially due to ESRD, but need to r/o ACS.  - cycle CE q6 x3 and repeat her EKG in the am  - Nitroglycerin, Morphine, and plavix, pravastatin - Risk factor stratification: will check FLP and A1C  - 2d echo - when necessaryAlbuterol inhaler prn and Mucinex for cough - Need cardiology consult in AM    HLD: Last LDL was not on record -Continue home medications: pravastatin -Check FLP  HTN: -lisinopril  Hx of cerebral artery occlusion with cerebral infarction [facility]): no acute new issues. -on Plavix  ESRD on dialysis (TTS): -continue Sensipar, Dialyvite -Renal was consulted  PAD  (peripheral artery disease) ([place]): -On plavix  DM-II: Last A1c not on record, Patient is taking 75/25 mix insulin at home -SSI -Check A1c  Leukocytosis: no signs of infection. Likely due to stress induced to demargination -will check UA -will get blood culture if she develops a fever -follow up by CBC  DVT ppx: On IV Heparin   Code Status: Full code Family Communication: None at bed side.     Disposition Plan: Admit to inpatient   Date of Service 02/02/2015    Ivor Costa [facility] Pager [phone]  If 7PM-7AM, please contact night-coverage [email or web address] Password [email or web address] 02/02/2015, 12:52 AM

## 2015-02-01 NOTE — Progress Notes (Addendum)
Transfer from [facility] per ED physician, Dr. Graylon Good.  69 year old lady with past medical history of ESRD-HD (TTS), stroke, CHF, CAD, retinal detachment, hypertension, hyperlipidemia, diabetes mellitus, who presents with left leg pain and shortness of breath to [facility].  Patient was found to have left femoral vein DVT by venous Doppler. CTA is negative for PE. Troponin is elevated at 0.12. Patient currently has no chest pain. Because of lack of dialysis center, patient is transferred to [place] per EDP, Dr. Graylon Good. Nephrology was consulted by EDP, Dr. Justin Mend recommended to start heparin drip. I spoke with Dr. Justin Mend on the phone, he recommended to consult to [facility] in AM (no need to call [facility] tonight).  Blood pressure 155/65, heart rate 73, oxygen saturation 95, temperature 98.9, WBC 12.4, potassium 3.7, bicarbonate 30, creatinine 6.6, BUN 29. Patient is accepted to Tele bed.   Ivor Costa, MD  [facility] Pager [phone]  If 7PM-7AM, please contact night-coverage [email or web address] Password [facility] 02/01/2015, 7:43 PM

## 2015-02-01 NOTE — Progress Notes (Signed)
ANTICOAGULATION CONSULT NOTE - Initial Consult  Pharmacy Consult for heparin Indication: DVT  No Known Allergies  Patient Measurements: Height: 5\' 4"  (162.6 cm) Weight: 166 lb 11.2 oz (75.615 kg) IBW/kg (Calculated) : 54.7 Heparin Dosing Weight: 70kg  Vital Signs: Temp: 98.7 F (37.1 C) (11/18 2143) Temp Source: Oral (11/18 2143) BP: 132/48 mmHg (11/18 2143) Pulse Rate: 73 (11/18 2143)    Medical History: Past Medical History  Diagnosis Date  . Hypertension   . Hyperlipidemia   . Diabetes mellitus   . CVA (cerebral vascular accident) ([place]) [place]    left sided weakness  . Protein malnutrition ([place])   . Secondary hyperparathyroidism ([place])   . Anemia   . End stage renal disease ([place])     initiated HD 2006  . Hx of echocardiogram     a. Echo 03/2010: EF 55-60%, Gr 1 diast dysfn, trivial MS, mild to mod LAE, PASP 34, ;  b. Echo 11/13: mild LVH, EF 60%, Gr 1 diast dysfn, Ao sclerosis, no AS, MAC, slight MS, mean 3 mmHg, PASP 34  . Hx of cardiovascular stress test     a. MV 03/2010:  no ischemia, EF 55%  . CHF (congestive heart failure) ([place])   . Retinal detachment, bilateral   . CAD (coronary artery disease)     Assessment: 69yo female w/ ESRD on HD c/o LLE pain x2d, at OSH found to have common femoral vein DVT though CT negative for PE, to continue heparin started at [place] from [place]: WBC 12.4, Hgb 11.3, Plt 102, K 3.7, SCr 6.6, INR 1.1, PTT 28, trop 0.12  Goal of Therapy:  Heparin level 0.3-0.7 units/ml Monitor platelets by anticoagulation protocol: Yes   Plan:  Heparin gtt started at low rate of 600 units/hr without bolus (assuming for low Plt but no notes from OSH to confirm) at 8p; will increase heparin gtt to 1000 units/hr and monitor heparin levels and CBC.  Wynona Neat, PharmD, BCPS  02/01/2015,11:25 PM

## 2015-02-01 NOTE — Progress Notes (Signed)
MID-LEVEL/ON-CALL NOTIFIED OF PATIENT'S ARRIVAL FROM [facility]

## 2015-02-01 NOTE — Progress Notes (Signed)
PATIENT ARRIVED TO UNIT 6E FROM [place] VIA STRETCHER WITH EMS. TRANSFERRED TO BED.  HEPARIN INFUSING UPON ARRIVAL AT RATE OF 6 ML/H.

## 2015-02-02 ENCOUNTER — Encounter (HOSPITAL_COMMUNITY): Payer: Self-pay | Admitting: *Deleted

## 2015-02-02 ENCOUNTER — Inpatient Hospital Stay (HOSPITAL_COMMUNITY): Payer: Medicare Other

## 2015-02-02 DIAGNOSIS — I82592 Chronic embolism and thrombosis of other specified deep vein of left lower extremity: Secondary | ICD-10-CM

## 2015-02-02 DIAGNOSIS — R079 Chest pain, unspecified: Secondary | ICD-10-CM

## 2015-02-02 LAB — CBC
HEMATOCRIT: 29.6 % — AB (ref 36.0–46.0)
HEMATOCRIT: 33.3 % — AB (ref 36.0–46.0)
HEMOGLOBIN: 9.4 g/dL — AB (ref 12.0–15.0)
HEMOGLOBIN: 10.4 g/dL — AB (ref 12.0–15.0)
MCH: 31.9 pg (ref 26.0–34.0)
MCH: 31.3 pg (ref 26.0–34.0)
MCHC: 31.8 g/dL (ref 30.0–36.0)
MCHC: 31.2 g/dL (ref 30.0–36.0)
MCV: 100.3 fL — ABNORMAL HIGH (ref 78.0–100.0)
MCV: 100.3 fL — AB (ref 78.0–100.0)
Platelets: 99 10*3/uL — ABNORMAL LOW (ref 150–400)
Platelets: 100 10*3/uL — ABNORMAL LOW (ref 150–400)
RBC: 2.95 MIL/uL — AB (ref 3.87–5.11)
RBC: 3.32 MIL/uL — AB (ref 3.87–5.11)
RDW: 15.4 % (ref 11.5–15.5)
RDW: 15.4 % (ref 11.5–15.5)
WBC: 7.6 10*3/uL (ref 4.0–10.5)
WBC: 7.8 10*3/uL (ref 4.0–10.5)

## 2015-02-02 LAB — TROPONIN I
TROPONIN I: 0.11 ng/mL — AB (ref <0.031)
TROPONIN I: 0.13 ng/mL — AB (ref <0.031)
TROPONIN I: 0.16 ng/mL — AB (ref <0.031)

## 2015-02-02 LAB — BASIC METABOLIC PANEL
ANION GAP: 9 (ref 5–15)
BUN: 32 mg/dL — ABNORMAL HIGH (ref 6–20)
CALCIUM: 9.4 mg/dL (ref 8.9–10.3)
CO2: 32 mmol/L (ref 22–32)
Chloride: 97 mmol/L — ABNORMAL LOW (ref 101–111)
Creatinine, Ser: 8.02 mg/dL — ABNORMAL HIGH (ref 0.44–1.00)
GFR, EST AFRICAN AMERICAN: 5 mL/min — AB (ref >60)
GFR, EST NON AFRICAN AMERICAN: 5 mL/min — AB (ref >60)
GLUCOSE: 130 mg/dL — AB (ref 65–99)
POTASSIUM: 3.9 mmol/L (ref 3.5–5.1)
Sodium: 138 mmol/L (ref 135–145)

## 2015-02-02 LAB — GLUCOSE, CAPILLARY
GLUCOSE-CAPILLARY: 97 mg/dL (ref 65–99)
Glucose-Capillary: 162 mg/dL — ABNORMAL HIGH (ref 65–99)
Glucose-Capillary: 168 mg/dL — ABNORMAL HIGH (ref 65–99)
Glucose-Capillary: 155 mg/dL — ABNORMAL HIGH (ref 65–99)

## 2015-02-02 LAB — PROTIME-INR
INR: 1.26 (ref 0.00–1.49)
PROTHROMBIN TIME: 16 s — AB (ref 11.6–15.2)

## 2015-02-02 LAB — HEPARIN LEVEL (UNFRACTIONATED): Heparin Unfractionated: 0.16 IU/mL — ABNORMAL LOW (ref 0.30–0.70)

## 2015-02-02 LAB — LIPID PANEL
CHOL/HDL RATIO: 1.8 ratio
Cholesterol: 119 mg/dL (ref 0–200)
HDL: 68 mg/dL (ref >40)
LDL CALC: 42 mg/dL (ref 0–99)
TRIGLYCERIDES: 43 mg/dL (ref <150)
VLDL: 9 mg/dL (ref 0–40)

## 2015-02-02 MED ORDER — DARBEPOETIN ALFA 60 MCG/0.3ML IJ SOSY
60.0000 ug | PREFILLED_SYRINGE | INTRAMUSCULAR | Status: DC
  Administered 2015-02-05: 60 ug via INTRAVENOUS
  Filled 2015-02-02: qty 0.3

## 2015-02-02 MED ORDER — HEPARIN SODIUM (PORCINE) 1000 UNIT/ML DIALYSIS: 20.0000 [IU]/kg | INTRAMUSCULAR | Status: DC | PRN

## 2015-02-02 MED ORDER — CALCITRIOL 0.5 MCG PO CAPS
1.5000 ug | ORAL_CAPSULE | ORAL | Status: DC
  Administered 2015-02-02 – 2015-02-07 (×3): 1.5 ug via ORAL
  Filled 2015-02-02 (×3): qty 3

## 2015-02-02 MED ORDER — LIDOCAINE-PRILOCAINE 2.5-2.5 % EX CREA
1.0000 "application " | TOPICAL_CREAM | CUTANEOUS | Status: DC | PRN
  Filled 2015-02-02: qty 5

## 2015-02-02 MED ORDER — HEPARIN BOLUS VIA INFUSION
2000.0000 [IU] | Freq: Once | INTRAVENOUS | Status: AC
  Administered 2015-02-02: 2000 [IU] via INTRAVENOUS
  Filled 2015-02-02: qty 2000

## 2015-02-02 MED ORDER — SODIUM CHLORIDE 0.9 % IV SOLN: 100.0000 mL | INTRAVENOUS | Status: DC | PRN

## 2015-02-02 MED ORDER — LIDOCAINE HCL (PF) 1 % IJ SOLN: 5.0000 mL | INTRAMUSCULAR | Status: DC | PRN

## 2015-02-02 MED ORDER — PENTAFLUOROPROP-TETRAFLUOROETH EX AERO: 1.0000 "application " | INHALATION_SPRAY | CUTANEOUS | Status: DC | PRN

## 2015-02-02 MED ORDER — ALTEPLASE 2 MG IJ SOLR
2.0000 mg | Freq: Once | INTRAMUSCULAR | Status: DC | PRN
  Filled 2015-02-02: qty 2

## 2015-02-02 MED ORDER — HEPARIN SODIUM (PORCINE) 1000 UNIT/ML DIALYSIS: 1000.0000 [IU] | INTRAMUSCULAR | Status: DC | PRN

## 2015-02-02 NOTE — Progress Notes (Signed)
PT Cancellation Note  Patient Details Name: Stefanie [name] MRN: [id number] DOB: 07/19/1945   Cancelled Treatment:    Reason Eval/Treat Not Completed: Medical issues which prohibited therapy;Patient not medically ready (DVT on LLE).  Check with  labs and nursing tomorrow to see if ready.   Ramond Dial 02/02/2015, 1:18 PM   Mee Hives, PT MS Acute Rehab Dept. Number: [facility] [id number] and [place] [phone]

## 2015-02-02 NOTE — Progress Notes (Signed)
ANTICOAGULATION CONSULT NOTE - Follow Up Consult  Pharmacy Consult for heparin Indication: DVT  No Known Allergies  Patient Measurements: Height: 5\' 4"  (162.6 cm) Weight: 166 lb 11.2 oz (75.615 kg) IBW/kg (Calculated) : 54.7 Heparin Dosing Weight: 70 kg  Vital Signs: Temp: 99.1 F (37.3 C) (11/19 1528) Temp Source: Oral (11/19 1528) BP: 114/48 mmHg (11/19 1528) Pulse Rate: 72 (11/19 1528)  Labs:  Recent Labs  02/02/15 02/02/15 0510 02/02/15 0810 02/02/15 1113 02/02/15 1712  HGB  --  9.4*  --  10.4*  --   HCT  --  29.6*  --  33.3*  --   PLT  --  99*  --  100*  --   LABPROT  --  16.0*  --   --   --   INR  --  1.26  --   --   --   HEPARINUNFRC  --   --  <0.10*  --  0.16*  CREATININE  --  8.02*  --   --   --   TROPONINI 0.13* 0.11*  --  0.16*  --     Estimated Creatinine Clearance: 6.6 mL/min (by C-G formula based on Cr of 8.02).  Assessment: 69 year old female admitted 02/01/2015 from [facility] found to have common femoral vein DVT.  PM HL = 0.12  Goal of Therapy:  Heparin level 0.3-0.7 units/ml Monitor platelets by anticoagulation protocol: Yes   Plan:  Heparin 2000 units iv bolus x 1 Heparin to 1200 units / hr Follow up AM labs  Thank you Anette Guarneri, PharmD [phone]  02/02/2015,6:02 PM

## 2015-02-02 NOTE — Consult Note (Signed)
[facility] Renal Consultation Note    Indication for Consultation:  Management of ESRD/hemodialysis; anemia, hypertension/volume and secondary hyperparathyroidism  HPI: Stefanie [name] is a 69 y.o. female with ESRD on TTS dialysis in [place] who presented from the [facility] ED after evaluation for SOB and having been found to have a left proximal mid femoral vein DVT. A complex 34 x 21 x 35 mm hypoechoic process was noted in the SQ tissues overlying the common fem vein.  She has had a prior stenting of left common femoral vein and ext iliac vein by Dr. Bridgett Larsson 02/2014.  Her left ankle and left leg has been sore for two days.  She denies fall or twisting of ankle and thought she had an xray to r/o fracture.  She said her ortho MD sent her to the ED She is intermittently SOB, but not at present.  CT angio was negative for PE per H and P CXR showed pulmonary vascular congestion. WBC 7.5 here (apparently 12.4 at [facility]) and troponins 0.13 and 0.11  She has been losing weight EDW lowered 2.5 kg on 11/15 with small gains since then.  She has had no N, V, D, cough, fever or chills.  No CP. She gets shots for right knee problems.  She has had no CP only SOB. She is due for HD today.   Past Medical History  Diagnosis Date  . Hypertension   . Hyperlipidemia   . Diabetes mellitus   . CVA (cerebral vascular accident) ([address]) [place]    left sided weakness  . Protein malnutrition ([place])   . Secondary hyperparathyroidism ([place])   . Anemia   . End stage renal disease ([place])     initiated HD 2006  . Hx of echocardiogram     a. Echo 03/2010: EF 55-60%, Gr 1 diast dysfn, trivial MS, mild to mod LAE, PASP 34, ;  b. Echo 11/13: mild LVH, EF 60%, Gr 1 diast dysfn, Ao sclerosis, no AS, MAC, slight MS, mean 3 mmHg, PASP 34  . Hx of cardiovascular stress test     a. MV 03/2010:  no ischemia, EF 55%  . CHF (congestive heart failure) ([place])   . Retinal detachment, bilateral   . CAD (coronary artery  disease)    Past Surgical History  Procedure Laterality Date  . Cataract extraction    . Tubal ligation    . Arteriovenous graft placement      BUE numerous times  . Arteriovenous graft placement  11/13/10    Lt femoral - Dr. Kellie Simmering  . Dg av dialysis graft declot or  06/06/11, 08/26/11    performed in IR  . Shuntogram N/A 05/29/2013    Procedure: Earney Mallet;  Surgeon: Conrad La Grange, MD;  Location: [facility] CATH LAB;  Service: Cardiovascular;  Laterality: N/A;  . Shuntogram Left 03/01/2014    Procedure: SHUNTOGRAM;  Surgeon: Conrad , MD;  Location: [facility] CATH LAB;  Service: Cardiovascular;  Laterality: Left;   Family History  Problem Relation Age of Onset  . Cardiomyopathy    . Heart disease Mother   . Diabetes Mother   . Hypertension Mother   . Heart attack Mother   . Diabetes Other   . Kidney disease Other   . Diabetes Daughter   . Diabetes Son   . Hypertension Son    Social History:  reports that she quit smoking about 31 years ago. She has never used smokeless tobacco. She reports that she does not drink  alcohol or use illicit drugs. No Known Allergies Prior to Admission medications   Medication Sig Start Date End Date Taking? Authorizing Provider  azithromycin (ZITHROMAX) 250 MG tablet  01/25/14   Historical Provider, MD  calcium acetate (ELIPHOS) 667 MG tablet Take 667 mg by mouth 3 (three) times daily with meals. Take 3 capsule in the morning, 2 capsules with a snack and 3 capsules in the evening.    Historical Provider, MD  calcium acetate (PHOSLO) 667 MG capsule  06/26/14   Historical Provider, MD  celecoxib (CELEBREX) 200 MG capsule Take 200 mg by mouth daily as needed. 10/30/14   Historical Provider, MD  cinacalcet (SENSIPAR) 30 MG tablet Take 30 mg by mouth at bedtime.    Historical Provider, MD  clopidogrel (PLAVIX) 75 MG tablet Take 1 tablet (75 mg total) by mouth daily. 03/01/14   Conrad Sherburne, MD  dicyclomine (BENTYL) 20 MG tablet  05/20/14   Historical Provider, MD   diphenoxylate-atropine (LOMOTIL) 2.5-0.025 MG per tablet  05/14/14   Historical Provider, MD  folic acid-vitamin b complex-vitamin c-selenium-zinc (DIALYVITE) 3 MG TABS tablet Take 1 tablet by mouth daily.    Historical Provider, MD  gabapentin (NEURONTIN) 300 MG capsule Take 300 mg by mouth at bedtime.    Historical Provider, MD  HUMALOG MIX 75/25 KWIKPEN (75-25) 100 UNIT/ML Kwikpen Inject 12-14 Units into the skin 2 (two) times daily. Inject 14 units every morning and 12 units every evening 01/08/14   Historical Provider, MD  ketoconazole (NIZORAL) 2 % cream Apply 1 application topically daily. 01/17/15   Landis Martins, DPM  levofloxacin (LEVAQUIN) 750 MG tablet  03/27/14   Historical Provider, MD  lisinopril (PRINIVIL,ZESTRIL) 20 MG tablet Take 20 mg by mouth daily.  02/17/14   Historical Provider, MD  ofloxacin (OCUFLOX) 0.3 % ophthalmic solution  04/30/14   Historical Provider, MD  omeprazole (PRILOSEC) 40 MG capsule TAKE 1 CAPSULE BY MOUTH DAILY BEFORE A MEAL 08/24/14   Historical Provider, MD  ondansetron (ZOFRAN) 4 MG tablet  05/20/14   Historical Provider, MD  pravastatin (PRAVACHOL) 40 MG tablet Take 40 mg by mouth daily.  02/11/14   Historical Provider, MD  PROAIR HFA 108 (90 BASE) MCG/ACT inhaler Inhale 1 puff into the lungs every 4 (four) hours as needed for wheezing or shortness of breath.  01/13/14   Historical Provider, MD  rOPINIRole (REQUIP) 0.25 MG tablet  06/08/14   Historical Provider, MD  rOPINIRole (REQUIP) 0.5 MG tablet Take 0.5 mg by mouth at bedtime. 07/27/14   Historical Provider, MD  SENSIPAR 60 MG tablet  07/25/14   Historical Provider, MD  silver sulfADIAZINE (SILVADENE) 1 % cream APPLY A 1/16 INCH (1.5 MM) THICK LAYER TO ENTIRE BURN AREA BY TOPICAL ROUTE ONCE DAILY 10/19/14   Historical Provider, MD  VOLTAREN 1 % GEL APPLY 2 GRAMS TO AREA UP TO 4 TIMES A DAY 09/25/14   Historical Provider, MD   Current Facility-Administered Medications  Medication Dose Route Frequency Provider  Last Rate Last Dose  . albuterol (PROVENTIL) (2.5 MG/3ML) 0.083% nebulizer solution 2.5 mg  2.5 mg Inhalation Q4H PRN Ivor Costa, MD      . calcium acetate (PHOSLO) capsule 667 mg  667 mg Oral TID WC Ivor Costa, MD   667 mg at 02/02/15 0943  . celecoxib (CELEBREX) capsule 200 mg  200 mg Oral Daily PRN Ivor Costa, MD      . cinacalcet [facility]) tablet 60 mg  60 mg Oral Q breakfast Soledad Gerlach  Blaine Hamper, MD   60 mg at 02/02/15 0944  . clopidogrel (PLAVIX) tablet 75 mg  75 mg Oral Daily Ivor Costa, MD   75 mg at 02/02/15 0943  . diclofenac sodium (VOLTAREN) 1 % transdermal gel 2 g  2 g Topical QID PRN Ivor Costa, MD      . gabapentin (NEURONTIN) capsule 300 mg  300 mg Oral QHS Ivor Costa, MD   300 mg at 02/02/15 0013  . guaiFENesin (MUCINEX) 12 hr tablet 600 mg  600 mg Oral BID PRN Ivor Costa, MD      . heparin ADULT infusion 100 units/mL (25000 units/250 mL)  1,000 Units/hr Intravenous Continuous Laren Everts, RPH 10 mL/hr at 02/02/15 0050 1,000 Units/hr at 02/02/15 0050  . insulin aspart (novoLOG) injection 0-9 Units  0-9 Units Subcutaneous TID WC Ivor Costa, MD   0 Units at 02/02/15 0800  . ketoconazole (NIZORAL) 2 % cream 1 application  1 application Topical Daily Ivor Costa, MD      . lisinopril (PRINIVIL,ZESTRIL) tablet 20 mg  20 mg Oral Daily Ivor Costa, MD   20 mg at 02/02/15 0944  . morphine 2 MG/ML injection 2 mg  2 mg Intravenous Q4H PRN Ivor Costa, MD      . multivitamin (RENA-VIT) tablet 1 tablet  1 tablet Oral QHS Ivor Costa, MD   1 tablet at 02/02/15 0020  . nitroGLYCERIN (NITROSTAT) SL tablet 0.4 mg  0.4 mg Sublingual Q5 min PRN Ivor Costa, MD      . ofloxacin (OCUFLOX) 0.3 % ophthalmic solution 1 drop  1 drop Both Eyes QID Ivor Costa, MD   1 drop at 02/02/15 0944  . ondansetron (ZOFRAN) tablet 4 mg  4 mg Oral Q6H PRN Ivor Costa, MD       Or  . ondansetron [facility]) injection 4 mg  4 mg Intravenous Q6H PRN Ivor Costa, MD      . pantoprazole (PROTONIX) EC tablet 40 mg  40 mg Oral Daily Ivor Costa, MD   40 mg at  02/02/15 0945  . pravastatin (PRAVACHOL) tablet 40 mg  40 mg Oral Daily Ivor Costa, MD   40 mg at 02/02/15 0020  . rOPINIRole (REQUIP) tablet 0.5 mg  0.5 mg Oral QHS Ivor Costa, MD   0.5 mg at 02/02/15 0013  . sodium chloride 0.9 % injection 3 mL  3 mL Intravenous Q12H Ivor Costa, MD   3 mL at 02/02/15 1000   Labs: Basic Metabolic Panel:  Recent Labs Lab 02/02/15 0510  NA 138  K 3.9  CL 97*  CO2 32  GLUCOSE 130*  BUN 32*  CREATININE 8.02*  CALCIUM 9.4   CBC:  Recent Labs Lab 02/02/15 0510  WBC 7.6  HGB 9.4*  HCT 29.6*  MCV 100.3*  PLT 99*   Cardiac Enzymes:  Recent Labs Lab 02/02/15 02/02/15 0510  TROPONINI 0.13* 0.11*   CBG:  Recent Labs Lab 02/01/15 2352 02/02/15 0834  GLUCAP 168* 97   Studies/Results: Dg Chest 2 View  02/02/2015  CLINICAL DATA:  Shortness of breath, cough, and weakness for 2 days. EXAM: CHEST  2 VIEW COMPARISON:  02/01/2015 FINDINGS: Moderate cardiomegaly and diffuse pulmonary vascular congestion remains stable. No evidence of acute infiltrate or pleural effusion. Short segment of wire again seen overlying the right clavicle. Endovascular stent seen in left axillary region. IMPRESSION: Stable cardiomegaly and pulmonary vascular congestion. No acute findings. Electronically Signed   By: Earle Gell M.D.   On: 02/02/2015 00:49  ROS: As per HPI otherwise negative.  Physical Exam: Filed Vitals:   02/01/15 2143 02/02/15 0439 02/02/15 0844  BP: 132/48 125/49 146/73  Pulse: 73 65 56  Temp: 98.7 F (37.1 C) 98.8 F (37.1 C) 97.5 F (36.4 C)  TempSrc: Oral Oral Oral  Resp: 20 16 16   Height: 5\' 4"  (1.626 m)    Weight: 75.615 kg (166 lb 11.2 oz)    SpO2: 99% 100% 92%     General: Well developed, well nourished, in no acute distress supine in bed on room air Head: Normocephalic, atraumatic, sclera non-icteric, mucus membranes are moist Neck: Supple. JVD not elevated. Lungs: Clear bilaterally to auscultation without wheezes, rales, or  rhonchi. Breathing is unlabored. Heart: RRR with S1 S2.  Abdomen: Soft, non-tender, non-distended with normoactive bowel sounds. Lower extremities: RLE without edema or ischemic changes nontender , LLE ankle and lower leg + edema/erythema and very tender; also has tenderness up into upper leg, but not as much Neuro: Alert and oriented X 3. Moves all extremities spontaneously. Psych:  Responds to questions appropriately with a normal affect. Dialysis Access: left thigh AVGG + bruit, somewhat pulsatile  Dialysis Orders: [facility] 3.5 hr 2 K 2 Ca profile 4 400/A 1.5 EDW 75 Heparin 4000 Mircera 50  q 2 w ksgiven 11/8 calcitriol 1.5 no Fe  Recent:  Hgb 10.8 iPTH 690 Ca/P ok  Assessment/Plan: 1. LLE DVT - on heparin per pharmacy/primary; Swelling and acute tenderness in ankle seems disproportionately tender /swollen compared to the location of her DVT 2. ESRD -  TTS - left thigh AVGG access doesn't appear clotted or be affected by DVT - Dr. Scot Dock evaluated; last intervention was by Dr. Bridgett Larsson in Dec 2015. HD today K 3.9 - use 4 K bath. If any problems, will contact IR 3. Hypertension/volume  - gets to edw - pul vasc congestion on CXR, may need slight decrease in EDW 4. Anemia  - Hgb 9.4 down some - for redose ESA next week  5. Metabolic bone disease -  Continue calcitriol/binders/sensipar 6. Nutrition - renal carb mod diet/vitamin- unintentional weight loss - add nepro 7. DM - per primary 8. Thrombocytopenia - follow (100 - 120 = baseline)  Myriam Jacobson, PA-C [place] [phone] 02/02/2015, 11:04 AM   Pt seen, examined, agree w assess/plan as above with additions as indicated.  ESRD patient with new LLE DVT.  Unclear if the clot involves her thigh graft for dialysis or not, the graft however appears patent on exam.  VVS has seen and recommending fistulogram. Plan HD tonight if graft will function properly.   Kelly Splinter MD [employer] pager [phone]     cell [phone] 02/02/2015, 6:54 PM

## 2015-02-02 NOTE — Progress Notes (Signed)
Utilization Review Completed.  

## 2015-02-02 NOTE — Consult Note (Signed)
[facility] [place]  Patient name: Stefanie [name] MRN: [id number] DOB: [birth year] Sex: female  REASON FOR CONSULT: Left lower extremity swelling. Consult is from Dr. Dyann Kief  HPI: Stefanie [name] is a 69 y.o. female, who has undergone previous angioplasty and stenting of the left common femoral vein and external iliac vein by Dr. Adele Barthel on 03/01/2014. She has had a functioning left thigh AV graft. When he last saw her in January the graft was working well. However, he was concerned that she may ultimately require more work in the venous system given the amount of irregularity present.  She was admitted yesterday. She had developed left lower extremity swelling and had a duplex scan at [facility] which showed a left lower extremity DVT. I do not have this study however, reportedly this showed a left common femoral vein DVT. She is not having significant pain in her left leg currently. Her main complaint is swelling. She is currently being treated with heparin.  She has exhausted her upper extremity access options. She uses her left thigh AV graft for dialysis.  Past Medical History  Diagnosis Date  . Hypertension   . Hyperlipidemia   . Diabetes mellitus   . CVA (cerebral vascular accident) ([place]) [place]    left sided weakness  . Protein malnutrition ([place])   . Secondary hyperparathyroidism ([place])   . Anemia   . End stage renal disease ([place])     initiated HD 2006  . Hx of echocardiogram     a. Echo 03/2010: EF 55-60%, Gr 1 diast dysfn, trivial MS, mild to mod LAE, PASP 34, ;  b. Echo 11/13: mild LVH, EF 60%, Gr 1 diast dysfn, Ao sclerosis, no AS, MAC, slight MS, mean 3 mmHg, PASP 34  . Hx of cardiovascular stress test     a. MV 03/2010:  no ischemia, EF 55%  . CHF (congestive heart failure) ([place])   . Retinal detachment, bilateral   . CAD (coronary artery disease)     Family History  Problem Relation Age of Onset  . Cardiomyopathy    . Heart  disease Mother   . Diabetes Mother   . Hypertension Mother   . Heart attack Mother   . Diabetes Other   . Kidney disease Other   . Diabetes Daughter   . Diabetes Son   . Hypertension Son     SOCIAL HISTORY: Social History   Social History  . Marital Status: Single    Spouse Name: N/A  . Number of Children: 5  . Years of Education: N/A   Occupational History  .     Social History Main Topics  . Smoking status: Former Smoker    Quit date: 03/17/1983  . Smokeless tobacco: Never Used  . Alcohol Use: No  . Drug Use: No  . Sexual Activity: Not on file   Other Topics Concern  . Not on file   Social History Narrative    No Known Allergies  Current Facility-Administered Medications  Medication Dose Route Frequency Provider Last Rate Last Dose  . albuterol (PROVENTIL) (2.5 MG/3ML) 0.083% nebulizer solution 2.5 mg  2.5 mg Inhalation Q4H PRN Ivor Costa, MD      . calcium acetate (PHOSLO) capsule 667 mg  667 mg Oral TID WC Ivor Costa, MD   667 mg at 02/02/15 0943  . celecoxib (CELEBREX) capsule 200 mg  200 mg Oral Daily PRN Ivor Costa, MD      .  cinacalcet (SENSIPAR) tablet 60 mg  60 mg Oral Q breakfast Ivor Costa, MD   60 mg at 02/02/15 0944  . clopidogrel (PLAVIX) tablet 75 mg  75 mg Oral Daily Ivor Costa, MD   75 mg at 02/02/15 0943  . diclofenac sodium (VOLTAREN) 1 % transdermal gel 2 g  2 g Topical QID PRN Ivor Costa, MD      . gabapentin (NEURONTIN) capsule 300 mg  300 mg Oral QHS Ivor Costa, MD   300 mg at 02/02/15 0013  . guaiFENesin (MUCINEX) 12 hr tablet 600 mg  600 mg Oral BID PRN Ivor Costa, MD      . heparin ADULT infusion 100 units/mL (25000 units/250 mL)  1,000 Units/hr Intravenous Continuous Laren Everts, RPH 10 mL/hr at 02/02/15 0050 1,000 Units/hr at 02/02/15 0050  . insulin aspart (novoLOG) injection 0-9 Units  0-9 Units Subcutaneous TID WC Ivor Costa, MD   0 Units at 02/02/15 0800  . ketoconazole (NIZORAL) 2 % cream 1 application  1 application Topical Daily Ivor Costa, MD      . lisinopril (PRINIVIL,ZESTRIL) tablet 20 mg  20 mg Oral Daily Ivor Costa, MD   20 mg at 02/02/15 0944  . morphine 2 MG/ML injection 2 mg  2 mg Intravenous Q4H PRN Ivor Costa, MD      . multivitamin (RENA-VIT) tablet 1 tablet  1 tablet Oral QHS Ivor Costa, MD   1 tablet at 02/02/15 0020  . nitroGLYCERIN (NITROSTAT) SL tablet 0.4 mg  0.4 mg Sublingual Q5 min PRN Ivor Costa, MD      . ofloxacin (OCUFLOX) 0.3 % ophthalmic solution 1 drop  1 drop Both Eyes QID Ivor Costa, MD   1 drop at 02/02/15 0944  . ondansetron (ZOFRAN) tablet 4 mg  4 mg Oral Q6H PRN Ivor Costa, MD       Or  . ondansetron [facility]) injection 4 mg  4 mg Intravenous Q6H PRN Ivor Costa, MD      . pantoprazole (PROTONIX) EC tablet 40 mg  40 mg Oral Daily Ivor Costa, MD   40 mg at 02/02/15 0945  . pravastatin (PRAVACHOL) tablet 40 mg  40 mg Oral Daily Ivor Costa, MD   40 mg at 02/02/15 0020  . rOPINIRole (REQUIP) tablet 0.5 mg  0.5 mg Oral QHS Ivor Costa, MD   0.5 mg at 02/02/15 0013  . sodium chloride 0.9 % injection 3 mL  3 mL Intravenous Q12H Ivor Costa, MD   3 mL at 02/02/15 1000    REVIEW OF SYSTEMS:  [X]  denotes positive finding, [ ]  denotes negative finding Cardiac  Comments:  Chest pain or chest pressure:    Shortness of breath upon exertion: X   Short of breath when lying flat:    Irregular heart rhythm:        Vascular    Pain in calf, thigh, or hip brought on by ambulation:    Pain in feet at night that wakes you up from your sleep:     Blood clot in your veins: X   Leg swelling:  X       Pulmonary    Oxygen at home:    Productive cough:     Wheezing:         Neurologic    Sudden weakness in arms or legs:     Sudden numbness in arms or legs:     Sudden onset of difficulty speaking or slurred speech:    Temporary  loss of vision in one eye:     Problems with dizziness:         Gastrointestinal    Blood in stool:     Vomited blood:         Genitourinary    Burning when urinating:     Blood in urine:         Psychiatric    Major depression:         Hematologic    Bleeding problems:    Problems with blood clotting too easily:        Skin    Rashes or ulcers:        Constitutional    Fever or chills:      PHYSICAL EXAM: Filed Vitals:   02/01/15 2143 02/02/15 0439 02/02/15 0844  BP: 132/48 125/49 146/73  Pulse: 73 65 56  Temp: 98.7 F (37.1 C) 98.8 F (37.1 C) 97.5 F (36.4 C)  TempSrc: Oral Oral Oral  Resp: 20 16 16   Height: 5\' 4"  (1.626 m)    Weight: 166 lb 11.2 oz (75.615 kg)    SpO2: 99% 100% 92%    GENERAL: The patient is a well-nourished female, in no acute distress. The vital signs are documented above. CARDIAC: There is a regular rate and rhythm.  VASCULAR: I do not detect carotid bruits. I cannot palpate pedal pulses although both feet are warm and well-perfused. She has moderate left lower extremity swelling. Her left thigh AV graft is patent with a good thrill although slightly pulsatile and the lateral aspect of the graft. PULMONARY: There is good air exchange bilaterally without wheezing or rales. ABDOMEN: Soft and non-tender with normal pitched bowel sounds.  MUSCULOSKELETAL: There are no major deformities or cyanosis. NEUROLOGIC: No focal weakness or paresthesias are detected. SKIN: There are no ulcers or rashes noted. PSYCHIATRIC: The patient has a normal affect.  DATA:  I do not have access to her duplex that was done at [place]:  DVT OF LEFT COMMON FEMORAL VEIN:  I agree with treating her DVT of the left lower extremity with IV heparin which she is on. The graft appears to have a decent thrill and so it may be that the clot is distal to the venous anastomosis. If this is the case then I think this should simply be treated with heparin and conversion to an oral anticoagulant. However, given that she has had previous venoplasty and stenting in the common femoral vein and external iliac vein I would recommend that she undergo  a formal fistulogram of her left thigh AV graft to further evaluate the outflow. If the clot is central to the venous anastomosis that she might potentially be a candidate for thrombolysis. I have discussed this with Amalia Hailey, PA.   Deitra Mayo [facility] [place]: [phone]

## 2015-02-02 NOTE — Progress Notes (Signed)
  Echocardiogram 2D Echocardiogram has been performed.  Kylil Swopes 02/02/2015, 9:26 AM

## 2015-02-02 NOTE — Progress Notes (Signed)
Patient seen and examined. Admitted after midnight secondary to SOB/DOE and LLE swelling and pain. Found to have left common femoral DVT (same leg where she has AVF access for HD). She denies CP, endorses no SOB, no fever and no nausea or vomiting. LLE is swollen, warm and tender to touch. Please refer to H7P written by Dr. Blaine Hamper for further info/details on admission.  Plan: -vascular suregry will assess patient -troponin flat elevated in patient with renal failure -will follow 2-d echo results and consult cardiology if needed -continue heparin drip -renal on board to assist with HD   Barton Dubois [phone]

## 2015-02-02 NOTE — Progress Notes (Signed)
ANTICOAGULATION CONSULT NOTE - Follow Up Consult  Pharmacy Consult for heparin Indication: DVT  No Known Allergies  Patient Measurements: Height: 5\' 4"  (162.6 cm) Weight: 166 lb 11.2 oz (75.615 kg) IBW/kg (Calculated) : 54.7 Heparin Dosing Weight: 70 kg  Vital Signs: Temp: 97.5 F (36.4 C) (11/19 0844) Temp Source: Oral (11/19 0844) BP: 146/73 mmHg (11/19 0844) Pulse Rate: 56 (11/19 0844)  Labs:  Recent Labs  02/02/15 02/02/15 0510 02/02/15 0810  HGB  --  9.4*  --   HCT  --  29.6*  --   PLT  --  99*  --   LABPROT  --  16.0*  --   INR  --  1.26  --   HEPARINUNFRC  --   --  <0.10*  CREATININE  --  8.02*  --   TROPONINI 0.13* 0.11*  --     Estimated Creatinine Clearance: 6.6 mL/min (by C-G formula based on Cr of 8.02).  Assessment: 68 year old female admitted 02/01/2015 from [facility] found to have common femoral vein DVT.   HL undetectable, spoke with RN who states patient's IV line had disconnected about 1 hour prior to level and patient was bleeding from the site. RN re attached heparin drip to alternative IV site and it has been infusing at 1000 units/hr. Will continue this same rate and recheck  HL in 8 hours.  Goal of Therapy:  Heparin level 0.3-0.7 units/ml Monitor platelets by anticoagulation protocol: Yes   Plan:  - Continue heparin 1000 units/hr  - Check heparin level at 1700 - Monitor daily HL, CBC and s/sx of bleeding  Dimitri Ped, PharmD. PGY-1 Pharmacy Resident Pager: [phone]  02/02/2015,9:17 AM

## 2015-02-03 DIAGNOSIS — I5023 Acute on chronic systolic (congestive) heart failure: Secondary | ICD-10-CM

## 2015-02-03 DIAGNOSIS — I82402 Acute embolism and thrombosis of unspecified deep veins of left lower extremity: Secondary | ICD-10-CM

## 2015-02-03 DIAGNOSIS — R7989 Other specified abnormal findings of blood chemistry: Secondary | ICD-10-CM

## 2015-02-03 DIAGNOSIS — I6350 Cerebral infarction due to unspecified occlusion or stenosis of unspecified cerebral artery: Secondary | ICD-10-CM

## 2015-02-03 DIAGNOSIS — D649 Anemia, unspecified: Secondary | ICD-10-CM

## 2015-02-03 LAB — CBC
HCT: 29.3 % — ABNORMAL LOW (ref 36.0–46.0)
Hemoglobin: 9.3 g/dL — ABNORMAL LOW (ref 12.0–15.0)
MCH: 31.2 pg (ref 26.0–34.0)
MCHC: 31.7 g/dL (ref 30.0–36.0)
MCV: 98.3 fL (ref 78.0–100.0)
PLATELETS: 102 10*3/uL — AB (ref 150–400)
RBC: 2.98 MIL/uL — AB (ref 3.87–5.11)
RDW: 15.6 % — AB (ref 11.5–15.5)
WBC: 5.8 10*3/uL (ref 4.0–10.5)

## 2015-02-03 LAB — HEPARIN LEVEL (UNFRACTIONATED)
HEPARIN UNFRACTIONATED: 0.42 [IU]/mL (ref 0.30–0.70)
Heparin Unfractionated: 0.62 IU/mL (ref 0.30–0.70)

## 2015-02-03 LAB — TYPE AND SCREEN
ABO/RH(D): O POS
Antibody Screen: NEGATIVE

## 2015-02-03 LAB — GLUCOSE, CAPILLARY
Glucose-Capillary: 108 mg/dL — ABNORMAL HIGH (ref 65–99)
Glucose-Capillary: 224 mg/dL — ABNORMAL HIGH (ref 65–99)
Glucose-Capillary: 157 mg/dL — ABNORMAL HIGH (ref 65–99)
Glucose-Capillary: 196 mg/dL — ABNORMAL HIGH (ref 65–99)

## 2015-02-03 LAB — ABO/RH: ABO/RH(D): O POS

## 2015-02-03 MED ORDER — REGADENOSON 0.4 MG/5ML IV SOLN
0.4000 mg | Freq: Once | INTRAVENOUS | Status: AC
  Administered 2015-02-04: 0.4 mg via INTRAVENOUS
  Filled 2015-02-03: qty 5

## 2015-02-03 MED ORDER — POLYETHYLENE GLYCOL 3350 17 G PO PACK
17.0000 g | PACK | Freq: Every day | ORAL | Status: DC
  Administered 2015-02-03 – 2015-02-07 (×4): 17 g via ORAL
  Filled 2015-02-03 (×4): qty 1

## 2015-02-03 MED ORDER — DOCUSATE SODIUM 50 MG PO CAPS
50.0000 mg | ORAL_CAPSULE | Freq: Two times a day (BID) | ORAL | Status: DC
  Administered 2015-02-03: 50 mg via ORAL
  Administered 2015-02-04: 100 mg via ORAL
  Administered 2015-02-04 – 2015-02-07 (×5): 50 mg via ORAL
  Filled 2015-02-03 (×13): qty 1

## 2015-02-03 MED ORDER — CARVEDILOL 3.125 MG PO TABS
3.1250 mg | ORAL_TABLET | Freq: Two times a day (BID) | ORAL | Status: DC
  Administered 2015-02-03 – 2015-02-08 (×10): 3.125 mg via ORAL
  Filled 2015-02-03 (×10): qty 1

## 2015-02-03 NOTE — Progress Notes (Signed)
ANTICOAGULATION CONSULT NOTE - Follow Up Consult  Pharmacy Consult for heparin Indication: DVT  No Known Allergies  Patient Measurements: Height: 5\' 4"  (162.6 cm) Weight: 174 lb 9.7 oz (79.2 kg) IBW/kg (Calculated) : 54.7 Heparin Dosing Weight: 70 kg  Vital Signs: Temp: 98.3 F (36.8 C) (11/20 0857) Temp Source: Oral (11/20 0857) BP: 116/67 mmHg (11/20 0857) Pulse Rate: 67 (11/20 0857)  Labs:  Recent Labs  02/02/15  02/02/15 0510  02/02/15 1113 02/02/15 1712 02/03/15 0310 02/03/15 1418  HGB  --   < > 9.4*  --  10.4*  --  9.3*  --   HCT  --   --  29.6*  --  33.3*  --  29.3*  --   PLT  --   --  99*  --  100*  --  102*  --   LABPROT  --   --  16.0*  --   --   --   --   --   INR  --   --  1.26  --   --   --   --   --   HEPARINUNFRC  --   --   --   < >  --  0.16* 0.62 0.42  CREATININE  --   --  8.02*  --   --   --   --   --   TROPONINI 0.13*  --  0.11*  --  0.16*  --   --   --   < > = values in this interval not displayed.  Estimated Creatinine Clearance: 6.7 mL/min (by C-G formula based on Cr of 8.02).  Assessment: 69 year old female admitted 02/01/2015 from [facility] found to have common femoral vein DVT.  Heparin level now therapeutic x 2  Goal of Therapy:  Heparin level 0.3-0.7 units/ml Monitor platelets by anticoagulation protocol: Yes   Plan:  Continue heparin at 1200 units / hr Follow up AM labs  Thank you Anette Guarneri, PharmD [phone]  02/03/2015,3:21 PM

## 2015-02-03 NOTE — Progress Notes (Signed)
[facility] PROGRESS NOTE  Stefanie [name] [id number] DOB: May 15, 1945 DOA: 02/01/2015 PCP: Bonnita Nasuti, MD  Assessment/Plan: 1-Left femoral vein DVT: on heparin drip -per [facility] will check shuntogram to make sure she doesn't need thrombolysis  -pharmacy helping with heparin level  2-ESRD: renal on board -will continue HD T-T-S  3-elevated troponin and DOE: cardiology consulted -echo with severe global hypokinesis and reduce EF -will continue lisinopril and will start b-blocker -Myoview in am (11/21)  4-anemia of chronic disease:  -ESA and IV iron as per renal discretion -Hgb stable  5-HLD: continue statins  6-Hx of cerebral artery occlusion with cerebral infarction [facility]): no acute new issues. -on Plavix -no new deficit   7-DM-II: Last A1c not on record, Patient is taking 75/25 mix insulin at home -SSI  8-acute on chronic L ventricular dysfunction -EF 30-35% -will start coreg low dose -volume managed by HD -Continue lisinopril -daily weights and strict intake and output  Code Status: Full Family Communication: daughter at bedside  Disposition Plan: remains inpatient; shuntogram and Myoview planned for 11/21; continue heparin drip    Consultants:  Renal service  Vascular surgery  Cardiology   Procedures:  2-D echo - Left ventricle: The cavity size was mildly dilated. Wall thickness was increased in a pattern of mild LVH. Systolic function was moderately to severely reduced. The estimated ejection fraction was in the range of 30% to 35%. Diffuse hypokinesis. Features are consistent with a pseudonormal left ventricular filling pattern, with concomitant abnormal relaxation and increased filling pressure (grade 2 diastolic dysfunction). Doppler parameters are consistent with high ventricular filling pressure. - Aortic valve: There was trivial regurgitation. - Mitral valve: Calcified annulus. Mildly thickened leaflets . There was  mild regurgitation. - Left atrium: The atrium was severely dilated. - Right ventricle: The cavity size was mildly dilated. Systolic function was moderately reduced. - Right atrium: The atrium was mildly dilated. - Pulmonary arteries: Systolic pressure was moderately increased. PA peak pressure: 55 mm Hg (S).  Myoview : planned for 11/21  Planned shuntogram for 11/21  Antibiotics:  None   HPI/Subjective: Afebrile, no CP, no SOB. Reports some constipation and pain in her LLE  Objective: Filed Vitals:   02/03/15 0857 02/03/15 1553  BP: 116/67 113/56  Pulse: 67 66  Temp: 98.3 F (36.8 C) 97.8 F (36.6 C)  Resp: 17 17    Intake/Output Summary (Last 24 hours) at 02/03/15 1641 Last data filed at 02/03/15 1310  Gross per 24 hour  Intake  971.5 ml  Output   1800 ml  Net -828.5 ml   Filed Weights   02/01/15 2143 02/02/15 2034 02/02/15 2220  Weight: 75.615 kg (166 lb 11.2 oz) 79.878 kg (176 lb 1.6 oz) 79.2 kg (174 lb 9.7 oz)    Exam:   General:  Afebrile, denies CP, nausea, vomiting and abd pain. Reports some pain on her LLE, but otherwise no acute distress.   Cardiovascular: S1 and S2, no rubs or gallops  Respiratory: good air movement no wheezing   Abdomen: soft, NT, ND, positive BS  Musculoskeletal: LLE edema, no RLE edema; positive bruit on her left leg AVG  Data Reviewed: Basic Metabolic Panel:  Recent Labs Lab 02/02/15 0510  NA 138  K 3.9  CL 97*  CO2 32  GLUCOSE 130*  BUN 32*  CREATININE 8.02*  CALCIUM 9.4   CBC:  Recent Labs Lab 02/02/15 0510 02/02/15 1113 02/03/15 0310  WBC 7.6 7.8 5.8  HGB 9.4* 10.4* 9.3*  HCT  29.6* 33.3* 29.3*  MCV 100.3* 100.3* 98.3  PLT 99* 100* 102*   Cardiac Enzymes:  Recent Labs Lab 02/02/15 02/02/15 0510 02/02/15 1113  TROPONINI 0.13* 0.11* 0.16*   CBG:  Recent Labs Lab 02/02/15 1635 02/02/15 2028 02/03/15 0756 02/03/15 1122 02/03/15 1622  GLUCAP 168* 155* 108* 224* 157*    Studies: Dg  Chest 2 View  02/02/2015  CLINICAL DATA:  Shortness of breath, cough, and weakness for 2 days. EXAM: CHEST  2 VIEW COMPARISON:  02/01/2015 FINDINGS: Moderate cardiomegaly and diffuse pulmonary vascular congestion remains stable. No evidence of acute infiltrate or pleural effusion. Short segment of wire again seen overlying the right clavicle. Endovascular stent seen in left axillary region. IMPRESSION: Stable cardiomegaly and pulmonary vascular congestion. No acute findings. Electronically Signed   By: Earle Gell M.D.   On: 02/02/2015 00:49    Scheduled Meds: . calcitRIOL  1.5 mcg Oral Q T,Th,Sa-HD  . calcium acetate  667 mg Oral TID WC  . cinacalcet  60 mg Oral Q breakfast  . clopidogrel  75 mg Oral Daily  . [START ON 02/05/2015] darbepoetin (ARANESP) injection - DIALYSIS  60 mcg Intravenous Q Tue-HD  . gabapentin  300 mg Oral QHS  . insulin aspart  0-9 Units Subcutaneous TID WC  . ketoconazole  1 application Topical Daily  . lisinopril  20 mg Oral Daily  . multivitamin  1 tablet Oral QHS  . ofloxacin  1 drop Both Eyes QID  . pantoprazole  40 mg Oral Daily  . pravastatin  40 mg Oral Daily  . [START ON 02/04/2015] regadenoson  0.4 mg Intravenous Once  . rOPINIRole  0.5 mg Oral QHS  . sodium chloride  3 mL Intravenous Q12H   Continuous Infusions: . heparin 1,200 Units/hr (02/02/15 1825)    Principal Problem:   DVT (deep venous thrombosis) (HCC) Active Problems:   HLD (hyperlipidemia)   ANEMIA   Essential hypertension   Cerebral artery occlusion with cerebral infarction ([place])   ESRD on dialysis ([place])   Secondary renal hyperparathyroidism ([place])   PAD (peripheral artery disease) ([place])   Type 2 diabetes, controlled, with neuropathy (HCC)   Leukocytosis   Elevated troponin   SOB (shortness of breath)    Time spent: 35 minutes    Barton Dubois  [facility] Pager [phone]. If 7PM-7AM, please contact night-coverage at [email or web address], password [facility] 02/03/2015, 4:41 PM   LOS: 2 days

## 2015-02-03 NOTE — Progress Notes (Signed)
PT Cancellation Note  Patient Details Name: Stefanie [name] MRN: [id number] DOB: 05-11-1945   Cancelled Treatment:    Reason Eval/Treat Not Completed: Medical issues which prohibited therapy Patient with elevated Troponin. Noted cardiology plan to investigate with [place] study for possible coronary ischemia. Per departmental guidelines, PT evaluation should be held until troponin demonstrates downward trend or is cleared by MD. We will follow-up tomorrow, please page for any questions.  [name] 02/03/2015, 12:46 PM  Camille Bal [place], [place]

## 2015-02-03 NOTE — Progress Notes (Signed)
ANTICOAGULATION CONSULT NOTE - Follow Up Consult  Pharmacy Consult for heparin Indication: DVT  No Known Allergies  Patient Measurements: Height: 5\' 4"  (162.6 cm) Weight: 174 lb 9.7 oz (79.2 kg) IBW/kg (Calculated) : 54.7 Heparin Dosing Weight: 70 kg  Vital Signs: Temp: 99.1 F (37.3 C) (11/20 0426) Temp Source: Oral (11/20 0426) BP: 120/47 mmHg (11/20 0426) Pulse Rate: 65 (11/20 0426)  Labs:  Recent Labs  02/02/15 02/02/15 0510 02/02/15 0810 02/02/15 1113 02/02/15 1712 02/03/15 0310  HGB  --  9.4*  --  10.4*  --   --   HCT  --  29.6*  --  33.3*  --   --   PLT  --  99*  --  100*  --   --   LABPROT  --  16.0*  --   --   --   --   INR  --  1.26  --   --   --   --   HEPARINUNFRC  --   --  <0.10*  --  0.16* 0.62  CREATININE  --  8.02*  --   --   --   --   TROPONINI 0.13* 0.11*  --  0.16*  --   --     Estimated Creatinine Clearance: 6.7 mL/min (by C-G formula based on Cr of 8.02).  Assessment: 69 year old female admitted [facility]/01/[facility] from [facility] found to have common femoral vein DVT. Heparin level therapeutic on 1200 units/hr. No bleeding noted.  Goal of Therapy:  Heparin level 0.3-0.7 units/ml Monitor platelets by anticoagulation protocol: Yes   Plan:  Continue heparin 1200 units / hr F/u heparin level in 6 hrs to confirm therapeutic F/u start of oral anticoagulation  Sherlon Handing, PharmD, BCPS Clinical pharmacist, pager [phone]  02/03/2015,5:10 AM

## 2015-02-03 NOTE — Consult Note (Signed)
CONSULT NOTE  Date: 02/03/2015               Patient Name:  Stefanie [name] MRN: [id number]  DOB: 1945/05/01 Age / Sex: 69 y.o., female        PCP: Bonnita Nasuti Primary Cardiologist: Caryl Comes            Referring Physician: [place]              Reason for Consult: LV systolic dysfunction           History of Present Illness: Patient is a 69 y.o. female with a PMHx of 69 y.o. female with PMH of ESRD-HD (TTS), stroke, CHF, CAD, retinal detachment, hypertension, hyperlipidemia, diabetes mellitus, gerd, depression, who presents with left leg pain and shortness of breath.,  Echo revealed a decrease in her left ventricle systolic function compared to previous echo in 2013. She has end-stage renal disease. She's had a flat but minimally elevated troponin level.  She's had some progressive shortness of breath several weeks. She states that she pants whenever she walks any distance at all.  She denies any chest pain .    She has a hx of a previous CVA. Has PAD - has seen Dr. Fletcher Anon and now sees [facility].    Medications: Outpatient medications: Prescriptions prior to admission  Medication Sig Dispense Refill Last Dose  . calcium acetate (ELIPHOS) 667 MG tablet Take 667 mg by mouth 3 (three) times daily with meals. Take 3 capsule in the morning, 2 capsules with a snack and 3 capsules in the evening.   02/02/2015 at Unknown time  . celecoxib (CELEBREX) 200 MG capsule Take 200 mg by mouth daily as needed.  1 Past Week at Unknown time  . cinacalcet (SENSIPAR) 30 MG tablet Take 30 mg by mouth at bedtime.   02/02/2015 at Unknown time  . cinacalcet (SENSIPAR) 90 MG tablet Take 90 mg by mouth daily.   Past Week at Unknown time  . folic acid-vitamin b complex-vitamin c-selenium-zinc (DIALYVITE) 3 MG TABS tablet Take 1 tablet by mouth daily.   02/02/2015 at Unknown time  . gabapentin (NEURONTIN) 300 MG capsule Take 300 mg by mouth at bedtime.   02/02/2015 at Unknown time  . insulin aspart  (NOVOLOG) 100 UNIT/ML injection Inject 14 Units into the skin 2 (two) times daily as needed for high blood sugar.   02/02/2015 at Unknown time  . latanoprost (XALATAN) 0.005 % ophthalmic solution Place 1 drop into the left eye at bedtime.   02/02/2015 at Unknown time  . lisinopril (PRINIVIL,ZESTRIL) 20 MG tablet Take 20 mg by mouth daily.    02/02/2015 at Unknown time  . lisinopril (PRINIVIL,ZESTRIL) 30 MG tablet Take 30 mg by mouth daily.   Past Week at Unknown time  . omeprazole (PRILOSEC) 40 MG capsule Take 40 mg by mouth daily.   02/02/2015 at Unknown time  . pravastatin (PRAVACHOL) 40 MG tablet Take 40 mg by mouth daily.    02/02/2015 at Unknown time  . PROAIR HFA 108 (90 BASE) MCG/ACT inhaler Inhale 1 puff into the lungs every 4 (four) hours as needed for wheezing or shortness of breath.    Past Month at Unknown time  . silver sulfADIAZINE (SILVADENE) 1 % cream APPLY A 1/16 INCH (1.5 MM) THICK LAYER TO ENTIRE BURN AREA BY TOPICAL ROUTE ONCE DAILY  0 Past Month at Unknown time  . traMADol (ULTRAM) 50 MG tablet Take 50 mg by mouth every  6 (six) hours as needed for moderate pain.   02/02/2015 at Unknown time  . VOLTAREN 1 % GEL APPLY 2 GRAMS TO AREA UP TO 4 TIMES A DAY  2 Past Month at Unknown time  . diphenoxylate-atropine (LOMOTIL) 2.5-0.025 MG per tablet   0 Unknown at Unknown time  . ketoconazole (NIZORAL) 2 % cream Apply 1 application topically daily. 15 g 0 Unknown at Unknown time  . levofloxacin (LEVAQUIN) 750 MG tablet   0 Unknown at Unknown time  . ofloxacin (OCUFLOX) 0.3 % ophthalmic solution    Unknown at Unknown time  . rOPINIRole (REQUIP) 0.5 MG tablet Take 0.5 mg by mouth at bedtime.  12 Unknown at Unknown time    Current medications: Current Facility-Administered Medications  Medication Dose Route Frequency Provider Last Rate Last Dose  . 0.9 %  sodium chloride infusion  100 mL Intravenous PRN Alric Seton, PA-C      . 0.9 %  sodium chloride infusion  100 mL Intravenous PRN  Alric Seton, PA-C      . albuterol (PROVENTIL) (2.5 MG/3ML) 0.083% nebulizer solution 2.5 mg  2.5 mg Inhalation Q4H PRN Ivor Costa, MD      . alteplase (CATHFLO ACTIVASE) injection 2 mg  2 mg Intracatheter Once PRN Alric Seton, PA-C      . calcitRIOL (ROCALTROL) capsule 1.5 mcg  1.5 mcg Oral Q T,Th,Sa-HD Alric Seton, PA-C   1.5 mcg at 02/02/15 1731  . calcium acetate (PHOSLO) capsule 667 mg  667 mg Oral TID WC Ivor Costa, MD   667 mg at 02/03/15 [id number]  . celecoxib (CELEBREX) capsule 200 mg  200 mg Oral Daily PRN Ivor Costa, MD      . cinacalcet (SENSIPAR) tablet 60 mg  60 mg Oral Q breakfast Ivor Costa, MD   60 mg at 02/03/15 0839  . clopidogrel (PLAVIX) tablet 75 mg  75 mg Oral Daily Ivor Costa, MD   75 mg at 02/03/15 1000  . [START ON 02/05/2015] Darbepoetin Alfa (ARANESP) injection 60 mcg  60 mcg Intravenous Q Tue-HD Alric Seton, PA-C      . diclofenac sodium (VOLTAREN) 1 % transdermal gel 2 g  2 g Topical QID PRN Ivor Costa, MD      . gabapentin (NEURONTIN) capsule 300 mg  300 mg Oral QHS Ivor Costa, MD   300 mg at 02/03/15 0200  . guaiFENesin (MUCINEX) 12 hr tablet 600 mg  600 mg Oral BID PRN Ivor Costa, MD      . heparin ADULT infusion 100 units/mL (25000 units/250 mL)  1,200 Units/hr Intravenous Continuous Barton Dubois, MD 12 mL/hr at 02/02/15 1825 1,200 Units/hr at 02/02/15 1825  . heparin injection 1,000 Units  1,000 Units Dialysis PRN Alric Seton, PA-C      . heparin injection 1,500 Units  20 Units/kg Dialysis PRN Alric Seton, PA-C      . insulin aspart (novoLOG) injection 0-9 Units  0-9 Units Subcutaneous TID WC Ivor Costa, MD   2 Units at 02/02/15 1731  . ketoconazole (NIZORAL) 2 % cream 1 application  1 application Topical Daily Ivor Costa, MD   1 application at [id number]  . lidocaine (PF) (XYLOCAINE) 1 % injection 5 mL  5 mL Intradermal PRN Alric Seton, PA-C      . lidocaine-prilocaine (EMLA) cream 1 application  1 application Topical PRN Alric Seton, PA-C      .  lisinopril (PRINIVIL,ZESTRIL) tablet 20 mg  20 mg Oral Daily Ivor Costa, MD  20 mg at 02/03/15 1000  . morphine 2 MG/ML injection 2 mg  2 mg Intravenous Q4H PRN Ivor Costa, MD      . multivitamin (RENA-VIT) tablet 1 tablet  1 tablet Oral QHS Ivor Costa, MD   1 tablet at 02/03/15 0200  . nitroGLYCERIN (NITROSTAT) SL tablet 0.4 mg  0.4 mg Sublingual Q5 min PRN Ivor Costa, MD      . ofloxacin (OCUFLOX) 0.3 % ophthalmic solution 1 drop  1 drop Both Eyes QID Ivor Costa, MD   1 drop at 02/03/15 1003  . ondansetron (ZOFRAN) tablet 4 mg  4 mg Oral Q6H PRN Ivor Costa, MD       Or  . ondansetron [facility]) injection 4 mg  4 mg Intravenous Q6H PRN Ivor Costa, MD      . pantoprazole (PROTONIX) EC tablet 40 mg  40 mg Oral Daily Ivor Costa, MD   40 mg at 02/03/15 1000  . pentafluoroprop-tetrafluoroeth (GEBAUERS) aerosol 1 application  1 application Topical PRN Alric Seton, PA-C      . pravastatin (PRAVACHOL) tablet 40 mg  40 mg Oral Daily Ivor Costa, MD   40 mg at 02/02/15 1741  . rOPINIRole (REQUIP) tablet 0.5 mg  0.5 mg Oral QHS Ivor Costa, MD   0.5 mg at 02/03/15 0200  . sodium chloride 0.9 % injection 3 mL  3 mL Intravenous Q12H Ivor Costa, MD   3 mL at 02/02/15 2200     No Known Allergies   Past Medical History  Diagnosis Date  . Hypertension   . Hyperlipidemia   . Diabetes mellitus   . CVA (cerebral vascular accident) ([place]) [place]    left sided weakness  . Protein malnutrition ([place])   . Secondary hyperparathyroidism ([place])   . Anemia   . End stage renal disease ([place])     initiated HD 2006  . Hx of echocardiogram     a. Echo 03/2010: EF 55-60%, Gr 1 diast dysfn, trivial MS, mild to mod LAE, PASP 34, ;  b. Echo 11/13: mild LVH, EF 60%, Gr 1 diast dysfn, Ao sclerosis, no AS, MAC, slight MS, mean 3 mmHg, PASP 34  . Hx of cardiovascular stress test     a. MV 03/2010:  no ischemia, EF 55%  . CHF (congestive heart failure) ([place])   . Retinal detachment, bilateral   . CAD (coronary artery disease)      Past Surgical History  Procedure Laterality Date  . Cataract extraction    . Tubal ligation    . Arteriovenous graft placement      BUE numerous times  . Arteriovenous graft placement  11/13/10    Lt femoral - Dr. Kellie Simmering  . Dg av dialysis graft declot or  06/06/11, 08/26/11    performed in IR  . Shuntogram N/A 05/29/2013    Procedure: Earney Mallet;  Surgeon: Conrad Houghton, MD;  Location[facility] LAB;  Service: Cardiovascular;  Laterality: N/A;  . Shuntogram Left 03/01/2014    Procedure: SHUNTOGRAM;  Surgeon: Conrad [place], MD;  Location: [facility] CATH LAB;  Service: Cardiovascular;  Laterality: Left;    Family History  Problem Relation Age of Onset  . Cardiomyopathy    . Heart disease Mother   . Diabetes Mother   . Hypertension Mother   . Heart attack Mother   . Diabetes Other   . Kidney disease Other   . Diabetes Daughter   . Diabetes Son   . Hypertension Son  Social History:  reports that she quit smoking about 31 years ago. She has never used smokeless tobacco. She reports that she does not drink alcohol or use illicit drugs.   Review of Systems: Constitutional:  denies fever, chills, diaphoresis, appetite change and fatigue.  HEENT: denies photophobia, eye pain, redness, hearing loss, ear pain, congestion, sore throat, rhinorrhea, sneezing, neck pain, neck stiffness and tinnitus.  Respiratory: admits to SOB, DOE, cough,    Cardiovascular: denies chest pain, palpitations and leg swelling.  Gastrointestinal: denies nausea, vomiting, abdominal pain, diarrhea, constipation, blood in stool.  Genitourinary: denies dysuria, urgency, frequency, hematuria, flank pain and difficulty urinating.  Musculoskeletal: denies  myalgias, back pain, joint swelling, arthralgias and gait problem.   Skin: denies pallor, rash and wound.  Neurological: denies dizziness, seizures, syncope, weakness, light-headedness, numbness and headaches.   Hematological: denies adenopathy, easy bruising, personal  or family bleeding history.  Psychiatric/ Behavioral: denies suicidal ideation, mood changes, confusion, nervousness, sleep disturbance and agitation.    Physical Exam: BP 116/67 mmHg  Pulse 67  Temp(Src) 98.3 F (36.8 C) (Oral)  Resp 17  Ht 5\' 4"  (1.626 m)  Wt 174 lb 9.7 oz (79.2 kg)  BMI 29.96 kg/m2  SpO2 100%  Wt Readings from Last 3 Encounters:  02/02/15 174 lb 9.7 oz (79.2 kg)  03/23/14 195 lb (88.451 kg)  03/01/14 195 lb (88.451 kg)    General: Vital signs reviewed and noted.   Chronically ill appearing.  Appears older than her state age  Head: Normocephalic, atraumatic, sclera anicteric,   Neck: Supple. Negative for carotid bruits. No JVD   Lungs:  Clear bilaterally, no  wheezes, rales, or rhonchi. Breathing is normal   Heart: RRR with S1 S2.  2-6 systolic murmur , no  rubs, or gallops   Abdomen/ GI :  Soft, non-tender, non-distended with normoactive bowel sounds. No hepatomegaly. No rebound/guarding. No obvious abdominal masses   MSK: Strength and the appear normal for age.   Extremities: No clubbing or cyanosis. No edema.  Distal pedal pulses are 2+ and equal   Neurologic:  CN are grossly intact,  No obvious motor or sensory defect.  Alert and oriented X 3. Moves all extremities spontaneously.  Psych: Responds to questions appropriately with a normal affect.     Lab results: Basic Metabolic Panel:  Recent Labs Lab 02/02/15 0510  NA 138  K 3.9  CL 97*  CO2 32  GLUCOSE 130*  BUN 32*  CREATININE 8.02*  CALCIUM 9.4    Liver Function Tests: No results for input(s): AST, ALT, ALKPHOS, BILITOT, PROT, ALBUMIN in the last 168 hours. No results for input(s): LIPASE, AMYLASE in the last 168 hours. No results for input(s): AMMONIA in the last 168 hours.  CBC:  Recent Labs Lab 02/02/15 0510 02/02/15 1113 02/03/15 0310  WBC 7.6 7.8 5.8  HGB 9.4* 10.4* 9.3*  HCT 29.6* 33.3* 29.3*  MCV 100.3* 100.3* 98.3  PLT 99* 100* 102*    Cardiac Enzymes:  Recent  Labs Lab 02/02/15 02/02/15 0510 02/02/15 1113  TROPONINI 0.13* 0.11* 0.16*    BNP: Invalid input(s): POCBNP  CBG:  Recent Labs Lab 02/02/15 1120 02/02/15 1635 02/02/15 2028 02/03/15 0756 02/03/15 1122  GLUCAP 162* 168* 155* 108* 224*    Coagulation Studies:  Recent Labs  02/02/15 0510  LABPROT 16.0*  INR 1.26     Other results: Personal review of EKG shows :  NSR at 60,  NS IVCD   Imaging: Dg Chest 2 View  02/02/2015  CLINICAL DATA:  Shortness of breath, cough, and weakness for 2 days. EXAM: CHEST  2 VIEW COMPARISON:  02/01/2015 FINDINGS: Moderate cardiomegaly and diffuse pulmonary vascular congestion remains stable. No evidence of acute infiltrate or pleural effusion. Short segment of wire again seen overlying the right clavicle. Endovascular stent seen in left axillary region. IMPRESSION: Stable cardiomegaly and pulmonary vascular congestion. No acute findings. Electronically Signed   By: Earle Gell M.D.   On: 02/02/2015 00:49      Assessment & Plan:  1. Acute on chronic systolic left ventricular dysfunction: The patient has had worsening left ventricle function since her previous echocardiogram in 2013.  She has not missed any dialysis sessions. She takes all of her medications. She's currently on lisinopril 20 g a day. She's not on a beta blocker.  We will start her on low-dose carvedilol. We'll get a [employer] study for further evaluation and to evaluate her for the possibility of coronary ischemia. I do not see a recent evaluation for her coronary artery disease although she states that she's had an evaluation for CAD in the past.    Ramond Dial., MD, [facility] 02/03/2015, 11:39 AM Office - [phone] Pager [phone]

## 2015-02-03 NOTE — Progress Notes (Signed)
  [facility] Progress Note   Subjective: alert, no complaints  Filed Vitals:   02/03/15 0100 02/03/15 0130 02/03/15 0426 02/03/15 0857  BP: 142/62 125/59 120/47 116/67  Pulse: 64 64 65 67  Temp:   99.1 F (37.3 C) 98.3 F (36.8 C)  TempSrc:   Oral Oral  Resp: 18 22 16 17   Height:      Weight:      SpO2:   96% 100%   Exam: Alert, no distress No jvd Chest clear bilat RRR no MRG Abd soft ntnd no ascites LLE 1+ pitting ankle edema, L thigh AVG +bruit no lesions or erythema No RLE edema Neuro nonfocal, alert  HD TTS [place]   3.5h  2/2 bath  P4   75kg  Heparin 4000 Mircera 50 q2 last 11/8 Calc 1.5 ug Recent Hb 10.8  pth 690  Ca/P ok      Assessment: 1. L femoral vein DVT - on IV heparin.  Question of whether DVT is proximal or distal to AVG venous anastamosis. Per [facility] notes will ask IR to do fistulogram as she may need thrombolysis.  2. ESRD - TTS - left thigh AVGG access last intervention was by Dr. Bridgett Larsson in Dec 2015 3. HTN - on lisinopril only, BP's good 4. Volume - up 3-4 kg by wts 5. Anemia - Hgb 9.4 down some - for redose ESA next week  6. Metabolic bone disease - Continue calcitriol/binders/sensipar 7. Nutrition - renal carb mod diet/vitamin- unintentional weight loss - add nepro 8. DM - per primary 9. Thrombocytopenia - follow (100 - 120 = baseline)  Plan - next HD Tuesday   Rob [place] pager [phone]    cell [phone] 02/03/2015, 2:28 PM    Recent Labs Lab 02/02/15 0510  NA 138  K 3.9  CL 97*  CO2 32  GLUCOSE 130*  BUN 32*  CREATININE 8.02*  CALCIUM 9.4   No results for input(s): AST, ALT, ALKPHOS, BILITOT, PROT, ALBUMIN in the last 168 hours.  Recent Labs Lab 02/02/15 0510 02/02/15 1113 02/03/15 0310  WBC 7.6 7.8 5.8  HGB 9.4* 10.4* 9.3*  HCT 29.6* 33.3* 29.3*  MCV 100.3* 100.3* 98.3  PLT 99* 100* 102*   . calcitRIOL  1.5 mcg Oral Q T,Th,Sa-HD  . calcium acetate  667 mg Oral TID WC   . cinacalcet  60 mg Oral Q breakfast  . clopidogrel  75 mg Oral Daily  . [START ON 02/05/2015] darbepoetin (ARANESP) injection - DIALYSIS  60 mcg Intravenous Q Tue-HD  . gabapentin  300 mg Oral QHS  . insulin aspart  0-9 Units Subcutaneous TID WC  . ketoconazole  1 application Topical Daily  . lisinopril  20 mg Oral Daily  . multivitamin  1 tablet Oral QHS  . ofloxacin  1 drop Both Eyes QID  . pantoprazole  40 mg Oral Daily  . pravastatin  40 mg Oral Daily  . [START ON 02/04/2015] regadenoson  0.4 mg Intravenous Once  . rOPINIRole  0.5 mg Oral QHS  . sodium chloride  3 mL Intravenous Q12H   . heparin 1,200 Units/hr (02/02/15 1825)   sodium chloride, sodium chloride, albuterol, alteplase, celecoxib, diclofenac sodium, guaiFENesin, heparin, heparin, lidocaine (PF), lidocaine-prilocaine, morphine injection, nitroGLYCERIN, ondansetron **OR** ondansetron (ZOFRAN) IV, pentafluoroprop-tetrafluoroeth

## 2015-02-03 NOTE — Progress Notes (Signed)
   VASCULAR SURGERY ASSESSMENT & PLAN:  * Plan as I outlined yesterday. "I agree with treating her DVT of the left lower extremity with IV heparin which she is on. The graft appears to have a decent thrill and so it may be that the clot is distal to the venous anastomosis. If this is the case then I think this should simply be treated with heparin and conversion to an oral anticoagulant. However, given that she has had previous venoplasty and stenting in the common femoral vein and external iliac vein I would recommend that she undergo a formal fistulogram of her left thigh AV graft to further evaluate the outflow. If the clot is central to the venous anastomosis that she might potentially be a candidate for thrombolysis."  * I will notify Dr. Adele Barthel of her admission. He did her previous iliofemoral venoplasty and stenting on the left in December 2015.  SUBJECTIVE: No specific complaints.  PHYSICAL EXAM: Filed Vitals:   02/03/15 0030 02/03/15 0100 02/03/15 0130 02/03/15 0426  BP: 116/56 142/62 125/59 120/47  Pulse: 65 64 64 65  Temp:    99.1 F (37.3 C)  TempSrc:    Oral  Resp: 18 18 22 16   Height:      Weight:      SpO2:    96%   The left lower extremity swelling has not significantly changed. He still has a good thrill in her left thigh AV graft. It is slightly pulsatile.  LABS: Lab Results  Component Value Date   WBC 5.8 02/03/2015   HGB 9.3* 02/03/2015   HCT 29.3* 02/03/2015   MCV 98.3 02/03/2015   PLT 102* 02/03/2015   Lab Results  Component Value Date   CREATININE 8.02* 02/02/2015   Lab Results  Component Value Date   INR 1.26 02/02/2015   CBG (last 3)   Recent Labs  02/02/15 1120 02/02/15 1635 02/02/15 2028  GLUCAP 162* 168* 155*    Principal Problem:   DVT (deep venous thrombosis) (HCC) Active Problems:   HLD (hyperlipidemia)   ANEMIA   Essential hypertension   Cerebral artery occlusion with cerebral infarction ([place])   ESRD on dialysis ([place])  Secondary renal hyperparathyroidism (HCC)   PAD (peripheral artery disease) ([place])   Type 2 diabetes, controlled, with neuropathy (HCC)   Leukocytosis   Elevated troponin   SOB (shortness of breath)   Gae Gallop [id number] 02/03/2015

## 2015-02-04 ENCOUNTER — Inpatient Hospital Stay (HOSPITAL_COMMUNITY): Payer: Medicare Other

## 2015-02-04 ENCOUNTER — Encounter (HOSPITAL_COMMUNITY): Payer: Medicare Other

## 2015-02-04 ENCOUNTER — Ambulatory Visit: Payer: Medicare Other | Admitting: Podiatry

## 2015-02-04 DIAGNOSIS — I5022 Chronic systolic (congestive) heart failure: Secondary | ICD-10-CM

## 2015-02-04 DIAGNOSIS — I420 Dilated cardiomyopathy: Secondary | ICD-10-CM

## 2015-02-04 DIAGNOSIS — I509 Heart failure, unspecified: Secondary | ICD-10-CM

## 2015-02-04 LAB — HEPARIN LEVEL (UNFRACTIONATED): HEPARIN UNFRACTIONATED: 0.14 [IU]/mL — AB (ref 0.30–0.70)

## 2015-02-04 LAB — GLUCOSE, CAPILLARY
GLUCOSE-CAPILLARY: 195 mg/dL — AB (ref 65–99)
GLUCOSE-CAPILLARY: 186 mg/dL — AB (ref 65–99)
Glucose-Capillary: 182 mg/dL — ABNORMAL HIGH (ref 65–99)
Glucose-Capillary: 230 mg/dL — ABNORMAL HIGH (ref 65–99)

## 2015-02-04 LAB — CBC
HCT: 30.1 % — ABNORMAL LOW (ref 36.0–46.0)
Hemoglobin: 9.5 g/dL — ABNORMAL LOW (ref 12.0–15.0)
MCH: 31.4 pg (ref 26.0–34.0)
MCHC: 31.6 g/dL (ref 30.0–36.0)
MCV: 99.3 fL (ref 78.0–100.0)
PLATELETS: 106 10*3/uL — AB (ref 150–400)
RBC: 3.03 MIL/uL — ABNORMAL LOW (ref 3.87–5.11)
RDW: 15.3 % (ref 11.5–15.5)
WBC: 4.3 10*3/uL (ref 4.0–10.5)

## 2015-02-04 LAB — NM MYOCAR MULTI W/SPECT W/WALL MOTION / EF
CHL CUP MPHR: 151 {beats}/min
CHL CUP RESTING HR STRESS: 65 {beats}/min
CSEPHR: 46 %
Estimated workload: 1 METS
Exercise duration (min): 5 min
Peak HR: 70 {beats}/min

## 2015-02-04 LAB — HEMOGLOBIN A1C
HEMOGLOBIN A1C: 6 % — AB (ref 4.8–5.6)
MEAN PLASMA GLUCOSE: 126 mg/dL

## 2015-02-04 MED ORDER — SODIUM CHLORIDE 0.9 % IV SOLN: INTRAVENOUS | Status: DC

## 2015-02-04 MED ORDER — HEPARIN BOLUS VIA INFUSION
2000.0000 [IU] | Freq: Once | INTRAVENOUS | Status: AC
  Administered 2015-02-04: 2000 [IU] via INTRAVENOUS
  Filled 2015-02-04: qty 2000

## 2015-02-04 MED ORDER — TECHNETIUM TC 99M SESTAMIBI GENERIC - CARDIOLITE
10.0000 | Freq: Once | INTRAVENOUS | Status: AC | PRN
  Administered 2015-02-04: 10 via INTRAVENOUS

## 2015-02-04 MED ORDER — HEPARIN (PORCINE) IN NACL 100-0.45 UNIT/ML-% IJ SOLN
1150.0000 [IU]/h | INTRAMUSCULAR | Status: DC
  Administered 2015-02-04: 1000 [IU]/h via INTRAVENOUS
  Filled 2015-02-04: qty 250

## 2015-02-04 MED ORDER — SODIUM CHLORIDE 0.9 % IJ SOLN
3.0000 mL | Freq: Two times a day (BID) | INTRAMUSCULAR | Status: DC
  Administered 2015-02-04: 3 mL via INTRAVENOUS

## 2015-02-04 MED ORDER — ASPIRIN 81 MG PO CHEW
81.0000 mg | CHEWABLE_TABLET | ORAL | Status: AC
  Administered 2015-02-05: 81 mg via ORAL
  Filled 2015-02-04: qty 1

## 2015-02-04 MED ORDER — TECHNETIUM TC 99M SESTAMIBI GENERIC - CARDIOLITE
30.0000 | Freq: Once | INTRAVENOUS | Status: AC | PRN
  Administered 2015-02-04: 30 via INTRAVENOUS

## 2015-02-04 MED ORDER — CEFAZOLIN SODIUM-DEXTROSE 2-3 GM-% IV SOLR
2.0000 g | INTRAVENOUS | Status: AC
  Filled 2015-02-04 (×2): qty 50

## 2015-02-04 MED ORDER — SODIUM CHLORIDE 0.9 % IJ SOLN: 3.0000 mL | INTRAMUSCULAR | Status: DC | PRN

## 2015-02-04 MED ORDER — SODIUM CHLORIDE 0.9 % IV SOLN: 250.0000 mL | INTRAVENOUS | Status: DC | PRN

## 2015-02-04 MED ORDER — REGADENOSON 0.4 MG/5ML IV SOLN
INTRAVENOUS | Status: AC
  Administered 2015-02-04: 0.4 mg via INTRAVENOUS
  Filled 2015-02-04: qty 5

## 2015-02-04 MED ORDER — IOHEXOL 300 MG/ML  SOLN
100.0000 mL | Freq: Once | INTRAMUSCULAR | Status: DC | PRN
  Administered 2015-02-04: 50 mL via INTRAVENOUS
  Filled 2015-02-04: qty 100

## 2015-02-04 MED FILL — Heparin Sodium (Porcine) 100 Unt/ML in Sodium Chloride 0.45%: INTRAMUSCULAR | Qty: 250 | Status: AC

## 2015-02-04 NOTE — Care Management Important Message (Signed)
Important Message  Patient Details  Name: Stefanie [name] MRN: [id number] Date of Birth: 12/11/1945   Medicare Important Message Given:  Yes    Barb Merino Jarquavious Fentress 02/04/2015, 3:51 PM

## 2015-02-04 NOTE — Procedures (Signed)
Normally functioning left thigh AV graft without peripheral or central stenosis.  No intervention performed.  No immediate post procedural complication.   Jay Jessina Marse, MD Pager #: [phone]    

## 2015-02-04 NOTE — Progress Notes (Signed)
ANTICOAGULATION CONSULT NOTE - Follow Up Consult  Pharmacy Consult for heparin Indication: DVT  No Known Allergies  Patient Measurements: Height: 5\' 4"  (162.6 cm) Weight: 166 lb 3.6 oz (75.4 kg) IBW/kg (Calculated) : 54.7 Heparin Dosing Weight: 70 kg  Vital Signs: Temp: 98 F (36.7 C) (11/21 2035) Temp Source: Oral (11/21 2035) BP: 100/64 mmHg (11/21 2035) Pulse Rate: 62 (11/21 2035)  Labs:  Recent Labs  02/02/15  02/02/15 0510  02/02/15 1113  02/03/15 0310 02/03/15 1418 02/04/15 0623 02/04/15 2052  HGB  --   < > 9.4*  --  10.4*  --  9.3*  --  9.5*  --   HCT  --   < > 29.6*  --  33.3*  --  29.3*  --  30.1*  --   PLT  --   < > 99*  --  100*  --  102*  --  106*  --   LABPROT  --   --  16.0*  --   --   --   --   --   --   --   INR  --   --  1.26  --   --   --   --   --   --   --   HEPARINUNFRC  --   --   --   < >  --   < > 0.62 0.42 >2.20* 0.14*  CREATININE  --   --  8.02*  --   --   --   --   --   --   --   TROPONINI 0.13*  --  0.11*  --  0.16*  --   --   --   --   --   < > = values in this interval not displayed.  Estimated Creatinine Clearance: 6.6 mL/min (by C-G formula based on Cr of 8.02).  Assessment: 69 year old female admitted 02/01/[facility] from [facility] found to have common femoral vein DVT.  Heparin level was critical high this am, now low at 0.14 on 1000 units/hr. Unsure if > 2.2 value this morning was error and actually drawn from line with heparin in it.   Goal of Therapy:  Heparin level 0.3-0.7 units/ml Monitor platelets by anticoagulation protocol: Yes   Plan:  Bolus heparin 2000 units then increase drip to 1150 units/hr Repeat HL with AM Labs Daily HL, CBC Monitor for s/sx of bleeding  Levester Fresh, PharmD, BCPS, [facility] Clinical Pharmacist Pager [phone] 02/04/2015 9:36 PM

## 2015-02-04 NOTE — Progress Notes (Signed)
[facility] Progress Note  Assessment/Plan: 1. L femoral vein DVT - on IV heparin. Question of whether DVT is proximal or distal to AVG venous anastamosis. VVS following, thoughts are that clot is distal to AVG.  Had normal fistulogram today per IR.Continue heparin drip.  2. ESRD - TTS - [place].  3. HTN - on lisinopril 20 mg PO Daily. BP controlled.  4. Volume - Last HD 11/20. Net UF 1800. Post wt 80.2 kg. Roughly 5 kg over OP EDW. Question accuracy. No edema. For HD tomorrow. Will attempt UFG 1.5-2.5 liters. EDW recently lowered in HD center.  5. Anemia - Hgb 9.4 down some - for redose ESA next week  6. Metabolic bone disease - Continue calcitriol/binders/sensipar 7. Nutrition - renal carb mod diet/vitamin- unintentional weight loss - add nepro 8. DM - per primary 9. Thrombocytopenia - Platelets 106 today. Will follow CBC.  10. Elevated Troponin/Acute on Chronic Systolic Dysfunction: Cardiology following.Troponin elevation flat. Echo done 11/19 shows Moderate to severe global reduction in LV function, grade 2 diastolic dysfunction with elevated LV filling pressure. EF 30-35%. Stress test done today.     [name] NP-C 02/04/2015, 2:50 PM  [facility] [phone]  Subjective: "I'm doing OK". Talking on phone. No C/Os.   Objective Filed Vitals:   02/04/15 1056 02/04/15 1058 02/04/15 1100 02/04/15 1237  BP: 116/45 131/52 138/55 148/56  Pulse: 62 67 68 69  Temp:      TempSrc:      Resp:    18  Height:      Weight:      SpO2:    100%   Physical Exam General: Well nourished, cooperative, NAD. Heart: S1,S2, RRR.  Lungs: Bilateral breath sounds CTA, Abdomen: soft, active BS.  Extremities: LLE slightly>RLE. No edema. Faint pulses.  Dialysis Access: left thigh AVGG + bruit  Dialysis Orders: [facility] 3.5 hr 2 K 2 Ca profile 4 400/A 1.5 EDW 75 Heparin 4000 Mircera 50 q 2 w ksgiven 11/8 calcitriol 1.5 no Fe Recent: Hgb 10.8 iPTH 690  Ca/P ok  Additional Objective Labs: Basic Metabolic Panel:  Recent Labs Lab 02/02/15 0510  NA 138  K 3.9  CL 97*  CO2 32  GLUCOSE 130*  BUN 32*  CREATININE 8.02*  CALCIUM 9.4   Liver Function Tests: No results for input(s): AST, ALT, ALKPHOS, BILITOT, PROT, ALBUMIN in the last 168 hours. No results for input(s): LIPASE, AMYLASE in the last 168 hours. CBC:  Recent Labs Lab 02/02/15 0510 02/02/15 1113 02/03/15 0310 02/04/15 0623  WBC 7.6 7.8 5.8 4.3  HGB 9.4* 10.4* 9.3* 9.5*  HCT 29.6* 33.3* 29.3* 30.1*  MCV 100.3* 100.3* 98.3 99.3  PLT 99* 100* 102* 106*   Blood Culture    Component Value Date/Time   SDES BLOOD LEFT ARM 11/14/2010 0250   SPECREQUEST BOTTLES DRAWN AEROBIC ONLY 5CC 11/14/2010 0250   CULT NO GROWTH 5 DAYS 11/14/2010 0250   REPTSTATUS 11/20/2010 FINAL 11/14/2010 0250    Cardiac Enzymes:  Recent Labs Lab 02/02/15 02/02/15 0510 02/02/15 1113  TROPONINI 0.13* 0.11* 0.16*   CBG:  Recent Labs Lab 02/03/15 1122 02/03/15 1622 02/03/15 2007 02/04/15 0733 02/04/15 1232  GLUCAP 224* 157* 196* 182* 195*   Iron Studies: No results for input(s): IRON, TIBC, TRANSFERRIN, FERRITIN in the last 72 hours. @lablastinr3 @ Studies/Results: Ir Shuntogram/ Fistulagram Left Mod Sed  02/04/2015  INDICATION: New left femoral vein DVT with functional left AV thigh graft. Please evaluate for thrombus affecting  the thigh graft. EXAM: DIALYSIS AV SHUNTOGRAM/FISTULAGRAM COMPARISON:  Declot left thigh AV graft - 04/14/2013 MEDICATIONS: None. CONTRAST:  66mL OMNIPAQUE IOHEXOL 300 MG/ML  SOLN ANESTHESIA/SEDATION: None FLUOROSCOPY TIME:  24 seconds ([id number] mGy) COMPLICATIONS: None immediate PROCEDURE: The left arm dialysis graft was prepped with Betadine in a sterile fashion, and a sterile drape was applied covering the operative field. A diagnostic shunt study was performed via an 18 gauge angiocatheter introduced into venous outflow. Venous drainage was assessed to the  level of the central veins in the chest. Proximal shunt was studied by reflux maneuver with temporary compression of venous outflow. The angiocath was removed and hemostasis was achieved with manual compression. A dressing was placed. The patient tolerated the procedure well without immediate post procedural complication. FINDINGS: The left thigh AV graft is widely patent. There is mild pseudo aneurysmal dilatation involving the venous stick zone of the graft without evidence of mural thrombosis. The remainder the venous limb is widely patent. There is a very mild (under 30%) narrowing involving the central most aspect of the venous limb regional to the anastomosis, not resulting in a hemodynamically significant stenosis. Overlapping stents are seen within the left external iliac vein. Both stents appear widely patent though there is a very minimal amount of InStent stenosis within the more peripheral overlapping venous stent, not resulting in a hemodynamically significant stenosis. The remainder of the venous system of the left lower extremity is widely patent to the level of the inferior aspect of the right atrium. Reflux shuntogram demonstrates wide patency of the arterial limb and arterial anastomosis. There is mild pseudo aneurysmal dilatation within the arterial stick zone of the graft without evidence of mural thrombus. IMPRESSION: Widely patent left thigh graft without evidence of a hemodynamically significant peripheral or central stenosis. No intervention performed. ACCESS: This access remains amenable to future percutaneous interventions as clinically indicated. Electronically Signed   By: Sandi Mariscal M.D.   On: 02/04/2015 13:12   Medications: . heparin 1,000 Units/hr (02/04/15 1242)   . calcitRIOL  1.5 mcg Oral Q T,Th,Sa-HD  . calcium acetate  667 mg Oral TID WC  . carvedilol  3.125 mg Oral BID WC  . cinacalcet  60 mg Oral Q breakfast  . clopidogrel  75 mg Oral Daily  . [START ON 02/05/2015]  darbepoetin (ARANESP) injection - DIALYSIS  60 mcg Intravenous Q Tue-HD  . docusate sodium  50 mg Oral BID  . gabapentin  300 mg Oral QHS  . insulin aspart  0-9 Units Subcutaneous TID WC  . ketoconazole  1 application Topical Daily  . lisinopril  20 mg Oral Daily  . multivitamin  1 tablet Oral QHS  . ofloxacin  1 drop Both Eyes QID  . pantoprazole  40 mg Oral Daily  . polyethylene glycol  17 g Oral Daily  . pravastatin  40 mg Oral Daily  . rOPINIRole  0.5 mg Oral QHS  . sodium chloride  3 mL Intravenous Q12H     I have seen and examined this patient and agree with plan as outlined by Juanell Fairly, NP.  Overall she feels well and is without complaints.  Anxious to get out of hospital before Thanksgiving.  Shuntogram normal of left thigh AVG.  Plan to cont with IHD while she remains an inpatient. Wilmetta Speiser A,MD 02/04/2015 4:42 PM

## 2015-02-04 NOTE — Progress Notes (Signed)
[facility] PROGRESS NOTE  Stefanie [name] [id number] DOB: 10/10/1945 DOA: 02/01/2015 PCP: Bonnita Nasuti, MD  Assessment/Plan: 1-Left femoral vein DVT: on heparin drip -per VVS will check shuntogram to make sure she doesn't need thrombolysis  -pharmacy helping with heparin level  2-ESRD: renal on board -will continue HD T-T-S  3-elevated troponin and DOE: cardiology consulted -echo with severe global hypokinesis and reduce EF -will continue lisinopril and will start b-blocker -Myoview planned for later today (11/21)  4-anemia of chronic disease:  -ESA and IV iron as per renal discretion -Hgb stable  5-HLD: continue statins  6-Hx of cerebral artery occlusion with cerebral infarction [facility]): no acute new issues. -on Plavix -no new deficit   7-DM-II: Last A1c not on record, Patient is taking 75/25 mix insulin at home -SSI  8-acute on chronic L ventricular dysfunction -EF 30-35% -will continue coreg low dose -volume managed by HD -Continue lisinopril -daily weights and strict intake and output -plan is for Myoview later today; will follow results and rec's from cardiology   Code Status: Full Family Communication: daughter at bedside  Disposition Plan: remains inpatient; shuntogram and Myoview planned and fistulogram planned for today 11/21; continue heparin drip    Consultants:  Renal service  Vascular surgery  Cardiology   Procedures:  2-D echo - Left ventricle: The cavity size was mildly dilated. Wall thickness was increased in a pattern of mild LVH. Systolic function was moderately to severely reduced. The estimated ejection fraction was in the range of 30% to 35%. Diffuse hypokinesis. Features are consistent with a pseudonormal left ventricular filling pattern, with concomitant abnormal relaxation and increased filling pressure (grade 2 diastolic dysfunction). Doppler parameters are consistent with high ventricular  filling pressure. - Aortic valve: There was trivial regurgitation. - Mitral valve: Calcified annulus. Mildly thickened leaflets . There was mild regurgitation. - Left atrium: The atrium was severely dilated. - Right ventricle: The cavity size was mildly dilated. Systolic function was moderately reduced. - Right atrium: The atrium was mildly dilated. - Pulmonary arteries: Systolic pressure was moderately increased. PA peak pressure: 55 mm Hg (S).  Myoview : planned for today 11/21  Planned shuntogram for today 11/21  Antibiotics:  None   HPI/Subjective: Afebrile, no CP, no SOB. Some LLE discomfort and swelling. Otherwise no acute complaints   Objective: Filed Vitals:   02/04/15 1058 02/04/15 1100  BP: 131/52 138/55  Pulse: 67 68  Temp:    Resp:      Intake/Output Summary (Last 24 hours) at 02/04/15 1132 Last data filed at 02/04/15 1006  Gross per 24 hour  Intake    480 ml  Output      1 ml  Net    479 ml   Filed Weights   02/02/15 2034 02/02/15 2220 02/03/15 2000  Weight: 79.878 kg (176 lb 1.6 oz) 79.2 kg (174 lb 9.7 oz) 80.2 kg (176 lb 12.9 oz)    Exam:   General:  Afebrile, denies CP, nausea, vomiting and abd pain. Reports still some pain and swelling on her LLE, but otherwise no acute distress.   Cardiovascular: S1 and S2, no rubs or gallops  Respiratory: good air movement no wheezing   Abdomen: soft, NT, ND, positive BS  Musculoskeletal: LLE edema, no RLE edema; positive bruit on her left leg AVG  Data Reviewed: Basic Metabolic Panel:  Recent Labs Lab 02/02/15 0510  NA 138  K 3.9  CL 97*  CO2 32  GLUCOSE 130*  BUN 32*  CREATININE  8.02*  CALCIUM 9.4   CBC:  Recent Labs Lab 02/02/15 0510 02/02/15 1113 02/03/15 0310 02/04/15 0623  WBC 7.6 7.8 5.8 4.3  HGB 9.4* 10.4* 9.3* 9.5*  HCT 29.6* 33.3* 29.3* 30.1*  MCV 100.3* 100.3* 98.3 99.3  PLT 99* 100* 102* 106*   Cardiac Enzymes:  Recent Labs Lab 02/02/15 02/02/15 0510  02/02/15 1113  TROPONINI 0.13* 0.11* 0.16*   CBG:  Recent Labs Lab 02/03/15 0756 02/03/15 1122 02/03/15 1622 02/03/15 2007 02/04/15 0733  GLUCAP 108* 224* 157* 196* 182*    Studies: No results found.  Scheduled Meds: . calcitRIOL  1.5 mcg Oral Q T,Th,Sa-HD  . calcium acetate  667 mg Oral TID WC  . carvedilol  3.125 mg Oral BID WC  . cinacalcet  60 mg Oral Q breakfast  . clopidogrel  75 mg Oral Daily  . [START ON 02/05/2015] darbepoetin (ARANESP) injection - DIALYSIS  60 mcg Intravenous Q Tue-HD  . docusate sodium  50 mg Oral BID  . gabapentin  300 mg Oral QHS  . insulin aspart  0-9 Units Subcutaneous TID WC  . ketoconazole  1 application Topical Daily  . lisinopril  20 mg Oral Daily  . multivitamin  1 tablet Oral QHS  . ofloxacin  1 drop Both Eyes QID  . pantoprazole  40 mg Oral Daily  . polyethylene glycol  17 g Oral Daily  . pravastatin  40 mg Oral Daily  . rOPINIRole  0.5 mg Oral QHS  . sodium chloride  3 mL Intravenous Q12H   Continuous Infusions: . heparin      Principal Problem:   DVT (deep venous thrombosis) (HCC) Active Problems:   HLD (hyperlipidemia)   ANEMIA   Essential hypertension   Cerebral artery occlusion with cerebral infarction ([place])   ESRD on dialysis ([place])   Secondary renal hyperparathyroidism ([place])   PAD (peripheral artery disease) ([employer] [place])   Type 2 diabetes, controlled, with neuropathy (HCC)   Leukocytosis   Elevated troponin   SOB (shortness of breath)    Time spent: 35 minutes    Barton Dubois  [facility] Pager [phone]. If 7PM-7AM, please contact night-coverage at [email or web address], password [facility] 02/04/2015, 11:32 AM  LOS: 3 days

## 2015-02-04 NOTE — Progress Notes (Signed)
ANTICOAGULATION CONSULT NOTE - Follow Up Consult  Pharmacy Consult for heparin Indication: DVT  Labs:  Recent Labs  02/02/15  02/02/15 0510  02/02/15 1113  02/03/15 0310 02/03/15 1418 02/04/15 0623  HGB  --   < > 9.4*  --  10.4*  --  9.3*  --  9.5*  HCT  --   < > 29.6*  --  33.3*  --  29.3*  --  30.1*  PLT  --   < > 99*  --  100*  --  102*  --  106*  LABPROT  --   --  16.0*  --   --   --   --   --   --   INR  --   --  1.26  --   --   --   --   --   --   HEPARINUNFRC  --   --   --   < >  --   < > 0.62 0.42 >2.20*  CREATININE  --   --  8.02*  --   --   --   --   --   --   TROPONINI 0.13*  --  0.11*  --  0.16*  --   --   --   --   < > = values in this interval not displayed.   Assessment: 69yo female supratherapeutic on heparin after two levels at goal, lab drawn correctly.  Goal of Therapy:  Heparin level 0.3-0.7 units/ml   Plan:  Will hold heparin gtt x1hr then resume at lower rate of 1000 units/hr and check level in [place], PharmD, BCPS  02/04/2015,8:06 AM

## 2015-02-04 NOTE — Progress Notes (Signed)
Myoview scan is high-risk with mixed infarct and ischemia. I have recommended cardiac cath and possible angioplasty/stenting. Discussed with her daughter who works on [place] at [employer] at the patient's request. I have reviewed the risks, indications, and alternatives to cardiac catheterization and possible PCI with the patient. Risks include but are not limited to bleeding, infection, vascular injury, stroke, myocardial infection, arrhythmia, kidney injury, radiation-related injury in the case of prolonged fluoroscopy use, emergency cardiac surgery, and death. The patient understands the risks of serious complication is low ([id number]).   Sherren Mocha 02/04/2015 5:52 PM

## 2015-02-04 NOTE — Progress Notes (Signed)
Patient Profile: 69 y.o. female with a PMHx of 69 y.o. female with PMH of ESRD-HD (TTS), stroke, CHF, CAD, retinal detachment, hypertension, hyperlipidemia, diabetes mellitus, gerd, depression, who presents with left leg pain and shortness of breath.,  Echo revealed a decrease in her left ventricle systolic function compared to previous echo in 2013. She has end-stage renal disease. She's had a flat but minimally elevated troponin level. She's had some progressive shortness of breath several weeks. She states that she pants whenever she walks any distance at all. She denies any chest pain .   Subjective: No complaints. Denies any chest pain or dyspnea currently.   Objective: Vital signs in last 24 hours: Temp:  [97.4 F (36.3 C)-97.9 F (36.6 C)] 97.9 F (36.6 C) (11/21 0406) Pulse Rate:  [62-69] 69 (11/21 1237) Resp:  [16-18] 18 (11/21 1237) BP: (113-148)/(45-64) 148/56 mmHg (11/21 1237) SpO2:  [99 %-100 %] 100 % (11/21 1237) Weight:  [176 lb 12.9 oz (80.2 kg)] 176 lb 12.9 oz (80.2 kg) (11/20 2000) Last BM Date: 02/02/15  Intake/Output from previous day: 11/20 0701 - 11/21 0700 In: 720 [P.O.:720] Out: 1 [Stool:1] Intake/Output this shift:    Medications Current Facility-Administered Medications  Medication Dose Route Frequency Provider Last Rate Last Dose  . albuterol (PROVENTIL) (2.5 MG/3ML) 0.083% nebulizer solution 2.5 mg  2.5 mg Inhalation Q4H PRN Ivor Costa, MD      . alteplase (CATHFLO ACTIVASE) injection 2 mg  2 mg Intracatheter Once PRN Alric Seton, PA-C      . calcitRIOL (ROCALTROL) capsule 1.5 mcg  1.5 mcg Oral Q T,Th,Sa-HD Alric Seton, PA-C   1.5 mcg at 02/02/15 1731  . calcium acetate (PHOSLO) capsule 667 mg  667 mg Oral TID WC Ivor Costa, MD   667 mg at 02/03/15 1652  . carvedilol (COREG) tablet 3.125 mg  3.125 mg Oral BID WC Barton Dubois, MD   3.125 mg at 02/03/15 1752  . celecoxib (CELEBREX) capsule 200 mg  200 mg Oral Daily PRN Ivor Costa, MD      .  cinacalcet (SENSIPAR) tablet 60 mg  60 mg Oral Q breakfast Ivor Costa, MD   60 mg at 02/03/15 0839  . clopidogrel (PLAVIX) tablet 75 mg  75 mg Oral Daily Ivor Costa, MD   75 mg at 02/03/15 1000  . [START ON 02/05/2015] Darbepoetin Alfa (ARANESP) injection 60 mcg  60 mcg Intravenous Q Tue-HD Alric Seton, PA-C      . diclofenac sodium (VOLTAREN) 1 % transdermal gel 2 g  2 g Topical QID PRN Ivor Costa, MD      . docusate sodium (COLACE) capsule 50 mg  50 mg Oral BID Barton Dubois, MD   50 mg at 02/03/15 2200  . gabapentin (NEURONTIN) capsule 300 mg  300 mg Oral QHS Ivor Costa, MD   300 mg at 02/03/15 2058  . guaiFENesin (MUCINEX) 12 hr tablet 600 mg  600 mg Oral BID PRN Ivor Costa, MD      . heparin ADULT infusion 100 units/mL (25000 units/250 mL)  1,000 Units/hr Intravenous Continuous Laren Everts, RPH 10 mL/hr at 02/04/15 1242 1,000 Units/hr at 02/04/15 1242  . insulin aspart (novoLOG) injection 0-9 Units  0-9 Units Subcutaneous TID WC Ivor Costa, MD   2 Units at 02/04/15 1241  . iohexol (OMNIPAQUE) 300 MG/ML solution 100 mL  100 mL Intravenous Once PRN Medication Radiologist, MD   50 mL at 02/04/15 1211  . ketoconazole (NIZORAL) 2 % cream 1  application  1 application Topical Daily Ivor Costa, MD   1 application at [id number]  . lisinopril (PRINIVIL,ZESTRIL) tablet 20 mg  20 mg Oral Daily Ivor Costa, MD   20 mg at 02/03/15 1000  . morphine 2 MG/ML injection 2 mg  2 mg Intravenous Q4H PRN Ivor Costa, MD      . multivitamin (RENA-VIT) tablet 1 tablet  1 tablet Oral QHS Ivor Costa, MD   1 tablet at 02/03/15 2058  . nitroGLYCERIN (NITROSTAT) SL tablet 0.4 mg  0.4 mg Sublingual Q5 min PRN Ivor Costa, MD      . ofloxacin (OCUFLOX) 0.3 % ophthalmic solution 1 drop  1 drop Both Eyes QID Ivor Costa, MD   1 drop at 02/03/15 2100  . ondansetron (ZOFRAN) tablet 4 mg  4 mg Oral Q6H PRN Ivor Costa, MD       Or  . ondansetron [facility]) injection 4 mg  4 mg Intravenous Q6H PRN Ivor Costa, MD      . pantoprazole  (PROTONIX) EC tablet 40 mg  40 mg Oral Daily Ivor Costa, MD   40 mg at 02/03/15 1000  . polyethylene glycol (MIRALAX / GLYCOLAX) packet 17 g  17 g Oral Daily Barton Dubois, MD   17 g at 02/03/15 1751  . pravastatin (PRAVACHOL) tablet 40 mg  40 mg Oral Daily Ivor Costa, MD   40 mg at 02/03/15 1752  . rOPINIRole (REQUIP) tablet 0.5 mg  0.5 mg Oral QHS Ivor Costa, MD   0.5 mg at 02/03/15 2058  . sodium chloride 0.9 % injection 3 mL  3 mL Intravenous Q12H Ivor Costa, MD   3 mL at 02/03/15 2101    PE: General appearance: alert, cooperative and no distress Neck: no carotid bruit and no JVD Lungs: clear to auscultation bilaterally Heart: regular rate and rhythm, S1, S2 normal, no murmur, click, rub or gallop Extremities: no LEE Pulses: 2+ and symmetric Skin: warm and dry Neurologic: Grossly normal  Lab Results:   Recent Labs  02/02/15 1113 02/03/15 0310 02/04/15 0623  WBC 7.8 5.8 4.3  HGB 10.4* 9.3* 9.5*  HCT 33.3* 29.3* 30.1*  PLT 100* 102* 106*   BMET  Recent Labs  02/02/15 0510  NA 138  K 3.9  CL 97*  CO2 32  GLUCOSE 130*  BUN 32*  CREATININE 8.02*  CALCIUM 9.4   PT/INR  Recent Labs  02/02/15 0510  LABPROT 16.0*  INR 1.26   Cholesterol  Recent Labs  02/02/15 0510  CHOL 119   Cardiac Panel (last 3 results)  Recent Labs  02/02/15 02/02/15 0510 02/02/15 1113  TROPONINI 0.13* 0.11* 0.16*    Studies/Results: 2D echo 02/02/15 Impressions:  - Moderate to severe global reduction in LV function; mild LVH and LVE; grade 2 diastolic dysfunction with elevated LV filling pressure; severe LAE; mild RAE/RVE; moderately reduced LV function; trace AI; mild MR and TR. Small density at LV apex of uncertain etiology; cannot R/O thrombus; suggest cardiac MRI or repeat study with contrast to further evaluate.  EF 30-35%  NST - results pending  Assessment/Plan    Principal Problem:   DVT (deep venous thrombosis) (HCC) Active Problems:   HLD  (hyperlipidemia)   ANEMIA   Essential hypertension   Cerebral artery occlusion with cerebral infarction (HCC)   ESRD on dialysis ([place])   Secondary renal hyperparathyroidism (HCC)   PAD (peripheral artery disease) (HCC)   Type 2 diabetes, controlled, with neuropathy (HCC)   Leukocytosis  Elevated troponin   SOB (shortness of breath)  1. Acute on chronic systolic left ventricular dysfunction: The patient has had worsening left ventricle function since her previous echocardiogram in 2013. EF now 30-35%.  Lexiscan myoview ordered to r/o ischemia. Results pending. Low dose coreg started yesterday. BP and HR both stable. She is on ACE-I therapy with lisinopril. Volume control through HD. Will f/u on results of stress test. If positive for ischemia, will plan for LHC.      LOS: 3 days    Brittainy M. Ladoris Gene 02/04/2015 12:58 PM  Patient seen, examined. Available data reviewed. Agree with findings, assessment, and plan as outlined by Ellen Henri, PA-C. Exam reveals an elderly woman in NAD. Lungs CTA, heart RRR, no edema. Agree with medical therapy as outlined for cardiomyopathy. Await stress imaging result from this morning's Myoview scan. Plan discussed with patient. I would anticipate medical therapy unless Myoview is High-Risk considering lack of angina, ESRD, etc.   Sherren Mocha, M.D. 02/04/2015 3:17 PM

## 2015-02-04 NOTE — Progress Notes (Signed)
OT Cancellation Note  Patient Details Name: Stefanie [name] MRN: [id number] DOB: 1945/04/08   Cancelled Treatment:    Treat Not Completed: Medical issues which prohibited therapy. Pt going for a medical stress test today to evaluate for ischemia. Troponin level not yet reported this date. OT to continue to follow acutely for most appropriate time to evaluate.  Vonita Moss   OTR/L Pager: [phone] Office: [phone] .  02/04/2015, 7:48 AM

## 2015-02-04 NOTE — Progress Notes (Signed)
PT Cancellation Note  Patient Details Name: Stefanie [name] MRN: [id number] DOB: Mar 22, 1945   Cancelled Treatment:    Reason Eval/Treat Not Completed: Medical issues which prohibited therapy. Pt going for a medical stress test today to evaluate for ischemia. Troponin level not yet reported this date. PT to return 11/22 as able and appropriate.   Kingsley Callander 02/04/2015, 7:26 AM   Kittie Plater, PT, DPT Pager #: [phone] Office #: [phone]

## 2015-02-04 NOTE — Progress Notes (Addendum)
   Daily Progress Note  Assessment/Planning: Known L CFV stenosis mgmt with stenting   Reviewed the L thigh shuntogram.  Will normally have venoplastied the likely venous anastomosis  Unclear if there is some in-stent restenosis on some images, so would likely venoplasty that disease also  Subjective   No complaints  Objective Filed Vitals:   02/04/15 1056 02/04/15 1058 02/04/15 1100 02/04/15 1237  BP: 116/45 131/52 138/55 148/56  Pulse: 62 67 68 69  Temp:      TempSrc:      Resp:    18  Height:      Weight:      SpO2:    100%    Intake/Output Summary (Last 24 hours) at 02/04/15 1500 Last data filed at 02/04/15 1006  Gross per 24 hour  Intake    240 ml  Output      1 ml  Net    239 ml   GEN   Somewhat confused VASC  Moderate size PSA in L thigh AVG, +bruit, weak thrill  Laboratory CBC    Component Value Date/Time   WBC 4.3 02/04/2015 0623   HGB 9.5* 02/04/2015 0623   HCT 30.1* 02/04/2015 0623   PLT 106* 02/04/2015 0623    BMET    Component Value Date/Time   NA 138 02/02/2015 0510   K 3.9 02/02/2015 0510   CL 97* 02/02/2015 0510   CO2 32 02/02/2015 0510   GLUCOSE 130* 02/02/2015 0510   BUN 32* 02/02/2015 0510   CREATININE 8.02* 02/02/2015 0510   CALCIUM 9.4 02/02/2015 0510   GFRNONAA 5* 02/02/2015 0510   GFRAA 5* 02/02/2015 0510    Adele Barthel, MD [facility] [place]: [phone] Pager: [phone]  02/04/2015, 3:00 PM   Addendum  Will schedule patient for L thigh shuntogram with venoplasty of venous anastomosis and iliofemoral stent this coming Wednesday with Dr. Oneida Alar.  Adele Barthel, MD [facility] [place] Office: [phone] Pager: [phone]  02/04/2015, 3:55 PM

## 2015-02-05 ENCOUNTER — Encounter (HOSPITAL_COMMUNITY)
Admission: AD | Disposition: A | Discharge status: Rehabilitation Facility | Payer: Self-pay | Source: Other Acute Inpatient Hospital | Attending: Internal Medicine

## 2015-02-05 DIAGNOSIS — N186 End stage renal disease: Secondary | ICD-10-CM | POA: Insufficient documentation

## 2015-02-05 DIAGNOSIS — I82412 Acute embolism and thrombosis of left femoral vein: Principal | ICD-10-CM

## 2015-02-05 DIAGNOSIS — R931 Abnormal findings on diagnostic imaging of heart and coronary circulation: Secondary | ICD-10-CM

## 2015-02-05 DIAGNOSIS — I2510 Atherosclerotic heart disease of native coronary artery without angina pectoris: Secondary | ICD-10-CM

## 2015-02-05 HISTORY — PX: CARDIAC CATHETERIZATION: SHX172

## 2015-02-05 LAB — CBC
HEMATOCRIT: 28.5 % — AB (ref 36.0–46.0)
Hemoglobin: 9 g/dL — ABNORMAL LOW (ref 12.0–15.0)
MCH: 31.3 pg (ref 26.0–34.0)
MCHC: 31.6 g/dL (ref 30.0–36.0)
MCV: 99 fL (ref 78.0–100.0)
PLATELETS: 130 10*3/uL — AB (ref 150–400)
RBC: 2.88 MIL/uL — ABNORMAL LOW (ref 3.87–5.11)
RDW: 15.2 % (ref 11.5–15.5)
WBC: 3.3 10*3/uL — ABNORMAL LOW (ref 4.0–10.5)

## 2015-02-05 LAB — BASIC METABOLIC PANEL
ANION GAP: 9 (ref 5–15)
BUN: 40 mg/dL — ABNORMAL HIGH (ref 6–20)
CHLORIDE: 97 mmol/L — AB (ref 101–111)
CO2: 29 mmol/L (ref 22–32)
Calcium: 9.3 mg/dL (ref 8.9–10.3)
Creatinine, Ser: 8.91 mg/dL — ABNORMAL HIGH (ref 0.44–1.00)
GFR calc Af Amer: 5 mL/min — ABNORMAL LOW (ref >60)
GFR, EST NON AFRICAN AMERICAN: 4 mL/min — AB (ref >60)
GLUCOSE: 222 mg/dL — AB (ref 65–99)
POTASSIUM: 4.7 mmol/L (ref 3.5–5.1)
Sodium: 135 mmol/L (ref 135–145)

## 2015-02-05 LAB — GLUCOSE, CAPILLARY
GLUCOSE-CAPILLARY: 96 mg/dL (ref 65–99)
GLUCOSE-CAPILLARY: 206 mg/dL — AB (ref 65–99)
Glucose-Capillary: 101 mg/dL — ABNORMAL HIGH (ref 65–99)

## 2015-02-05 LAB — PROTIME-INR
INR: 1.11 (ref 0.00–1.49)
Prothrombin Time: 14.5 seconds (ref 11.6–15.2)

## 2015-02-05 LAB — POCT ACTIVATED CLOTTING TIME: Activated Clotting Time: 127 seconds

## 2015-02-05 LAB — HEPARIN LEVEL (UNFRACTIONATED): HEPARIN UNFRACTIONATED: 0.33 [IU]/mL (ref 0.30–0.70)

## 2015-02-05 SURGERY — LEFT HEART CATH AND CORONARY ANGIOGRAPHY
Anesthesia: LOCAL

## 2015-02-05 MED ORDER — ACETAMINOPHEN 325 MG PO TABS: 650.0000 mg | ORAL_TABLET | ORAL | Status: DC | PRN

## 2015-02-05 MED ORDER — PENTAFLUOROPROP-TETRAFLUOROETH EX AERO: 1.0000 "application " | INHALATION_SPRAY | CUTANEOUS | Status: DC | PRN

## 2015-02-05 MED ORDER — SODIUM CHLORIDE 0.9 % IV SOLN: 250.0000 mL | INTRAVENOUS | Status: DC | PRN

## 2015-02-05 MED ORDER — HEPARIN (PORCINE) IN NACL 2-0.9 UNIT/ML-% IJ SOLN
INTRAMUSCULAR | Status: DC | PRN
Start: 2015-02-05 — End: 2015-02-05
  Administered 2015-02-05: 17:00:00

## 2015-02-05 MED ORDER — CINACALCET HCL 30 MG PO TABS
90.0000 mg | ORAL_TABLET | Freq: Every day | ORAL | Status: DC
  Administered 2015-02-06 – 2015-02-08 (×2): 90 mg via ORAL
  Filled 2015-02-05 (×4): qty 3

## 2015-02-05 MED ORDER — LIDOCAINE HCL (PF) 1 % IJ SOLN
INTRAMUSCULAR | Status: DC | PRN
  Administered 2015-02-05: 20 mL

## 2015-02-05 MED ORDER — TRAMADOL HCL 50 MG PO TABS
50.0000 mg | ORAL_TABLET | Freq: Four times a day (QID) | ORAL | Status: DC | PRN
  Filled 2015-02-05 (×2): qty 1

## 2015-02-05 MED ORDER — SODIUM CHLORIDE 0.9 % IV SOLN
INTRAVENOUS | Status: DC | PRN
  Administered 2015-02-05: 10 mL/h via INTRAVENOUS

## 2015-02-05 MED ORDER — MIDAZOLAM HCL 2 MG/2ML IJ SOLN
INTRAMUSCULAR | Status: DC | PRN
  Administered 2015-02-05: 1 mg via INTRAVENOUS

## 2015-02-05 MED ORDER — SODIUM CHLORIDE 0.9 % IJ SOLN: 3.0000 mL | INTRAMUSCULAR | Status: DC | PRN

## 2015-02-05 MED ORDER — SODIUM CHLORIDE 0.9 % IV SOLN: 100.0000 mL | INTRAVENOUS | Status: DC | PRN

## 2015-02-05 MED ORDER — FENTANYL CITRATE (PF) 100 MCG/2ML IJ SOLN
INTRAMUSCULAR | Status: DC | PRN
  Administered 2015-02-05: 25 ug via INTRAVENOUS

## 2015-02-05 MED ORDER — LIDOCAINE HCL (PF) 1 % IJ SOLN
INTRAMUSCULAR | Status: AC
  Filled 2015-02-05: qty 30

## 2015-02-05 MED ORDER — ATROPINE SULFATE 0.1 MG/ML IJ SOLN
INTRAMUSCULAR | Status: DC
Start: 2015-02-05 — End: 2015-02-05
  Filled 2015-02-05: qty 10

## 2015-02-05 MED ORDER — HEPARIN (PORCINE) IN NACL 100-0.45 UNIT/ML-% IJ SOLN
1200.0000 [IU]/h | INTRAMUSCULAR | Status: DC
  Filled 2015-02-05: qty 250

## 2015-02-05 MED ORDER — HEPARIN (PORCINE) IN NACL 2-0.9 UNIT/ML-% IJ SOLN
INTRAMUSCULAR | Status: AC
  Filled 2015-02-05: qty 1000

## 2015-02-05 MED ORDER — ONDANSETRON HCL 4 MG/2ML IJ SOLN: 4.0000 mg | Freq: Four times a day (QID) | INTRAMUSCULAR | Status: DC | PRN

## 2015-02-05 MED ORDER — LIDOCAINE-PRILOCAINE 2.5-2.5 % EX CREA: 1.0000 "application " | TOPICAL_CREAM | CUTANEOUS | Status: DC | PRN

## 2015-02-05 MED ORDER — MIDAZOLAM HCL 2 MG/2ML IJ SOLN
INTRAMUSCULAR | Status: AC
  Filled 2015-02-05: qty 2

## 2015-02-05 MED ORDER — DARBEPOETIN ALFA 60 MCG/0.3ML IJ SOSY
PREFILLED_SYRINGE | INTRAMUSCULAR | Status: AC
  Administered 2015-02-05: 60 ug via INTRAVENOUS
  Filled 2015-02-05: qty 0.3

## 2015-02-05 MED ORDER — LIDOCAINE HCL (PF) 1 % IJ SOLN: 5.0000 mL | INTRAMUSCULAR | Status: DC | PRN

## 2015-02-05 MED ORDER — HEPARIN SODIUM (PORCINE) 1000 UNIT/ML DIALYSIS
1000.0000 [IU] | INTRAMUSCULAR | Status: DC | PRN
  Filled 2015-02-05: qty 1

## 2015-02-05 MED ORDER — IOHEXOL 350 MG/ML SOLN
INTRAVENOUS | Status: DC | PRN
  Administered 2015-02-05: 75 mL via INTRAVENOUS

## 2015-02-05 MED ORDER — FENTANYL CITRATE (PF) 100 MCG/2ML IJ SOLN
INTRAMUSCULAR | Status: AC
  Filled 2015-02-05: qty 2

## 2015-02-05 MED ORDER — HEPARIN SODIUM (PORCINE) 5000 UNIT/ML IJ SOLN: 5000.0000 [IU] | Freq: Three times a day (TID) | INTRAMUSCULAR | Status: DC

## 2015-02-05 MED ORDER — SODIUM CHLORIDE 0.9 % IJ SOLN
3.0000 mL | Freq: Two times a day (BID) | INTRAMUSCULAR | Status: DC
Start: 2015-02-05 — End: 2015-02-05

## 2015-02-05 MED ORDER — LATANOPROST 0.005 % OP SOLN
1.0000 [drp] | Freq: Every day | OPHTHALMIC | Status: DC
  Administered 2015-02-06 – 2015-02-07 (×2): 1 [drp] via OPHTHALMIC
  Filled 2015-02-05: qty 2.5

## 2015-02-05 MED ORDER — CALCITRIOL 0.5 MCG PO CAPS
ORAL_CAPSULE | ORAL | Status: AC
  Administered 2015-02-05: 1.5 ug via ORAL
  Filled 2015-02-05: qty 3

## 2015-02-05 MED ORDER — SODIUM CHLORIDE 0.9 % IJ SOLN: 3.0000 mL | Freq: Two times a day (BID) | INTRAMUSCULAR | Status: DC

## 2015-02-05 SURGICAL SUPPLY — 9 items
CATH INFINITI 5 FR 3DRC (CATHETERS) ×2 IMPLANT
CATH INFINITI 5FR JL5 (CATHETERS) ×2 IMPLANT
CATH INFINITI 5FR MULTPACK ANG (CATHETERS) ×2 IMPLANT
KIT HEART LEFT (KITS) ×2 IMPLANT
PACK CARDIAC CATHETERIZATION (CUSTOM PROCEDURE TRAY) ×2 IMPLANT
SHEATH PINNACLE 5F 10CM (SHEATH) ×2 IMPLANT
SYR MEDRAD MARK V 150ML (SYRINGE) ×2 IMPLANT
TRANSDUCER W/STOPCOCK (MISCELLANEOUS) ×2 IMPLANT
WIRE EMERALD 3MM-J .035X150CM (WIRE) ×2 IMPLANT

## 2015-02-05 NOTE — Progress Notes (Signed)
PT Cancellation Note  Patient Details Name: Stefanie [name] MRN: [id number] DOB: 1946-01-31   Cancelled Treatment:    Reason Eval/Treat Not Completed: Patient at procedure or test/ unavailable (HD) OT to follow acutely and return at a more appropriate time. OF note : possible procedure Wednesday by Dr. Oneida Alar. Pt to be evaluate as appropriate post procedure, most likely Friday 11/25.   Kingsley Callander 02/05/2015, 8:21 AM  Kittie Plater, PT, DPT Pager #: [phone] Office #: [phone]

## 2015-02-05 NOTE — Interval H&P Note (Signed)
Cath Lab Visit (complete for each Cath Lab visit)  Clinical Evaluation Leading to the Procedure:   ACS: No.  Non-ACS:    Anginal Classification: CCS III  Anti-ischemic medical therapy: Minimal Therapy (1 class of medications)  Non-Invasive Test Results: High-risk stress test findings: cardiac mortality >3%/year  Prior CABG: No previous CABG   Low EF. Inferior defect.   History and Physical Interval Note:  02/05/2015 4:34 PM  [name]  has presented today for surgery, with the diagnosis of Abnormal Stress Test  The various methods of treatment have been discussed with the patient and family. After consideration of risks, benefits and other options for treatment, the patient has consented to  Procedure(s): Left Heart Cath and Coronary Angiography (N/A) as a surgical intervention .  The patient's history has been reviewed, patient examined, no change in status, stable for surgery.  I have reviewed the patient's chart and labs.  Questions were answered to the patient's satisfaction.     Yee Joss S.

## 2015-02-05 NOTE — Progress Notes (Signed)
[facility] PROGRESS NOTE  Stefanie [name] [id number] DOB: 07/19/1945 DOA: 02/01/2015 PCP: Bonnita Nasuti, MD  Assessment/Plan: 1-Left femoral vein DVT: on heparin drip -per VVS and following results of fistulogram, no needs for thrombolysis  -pharmacy helping with heparin level and then will be started on coumadin (after heart cath)  2-ESRD: renal on board -will continue HD T-T-S  3-elevated troponin and DOE: cardiology consulted -echo with severe global hypokinesis and reduce EF -will continue lisinopril and b-blocker -Myoview abnormal and high risk; will have heart cath on 11/22  4-anemia of chronic disease:  -ESA and IV iron as per renal discretion -Hgb stable  5-HLD: continue statins  6-Hx of cerebral artery occlusion with cerebral infarction [facility]): no acute new issues. -on Plavix -no new deficit   7-DM-II: Last A1c not on record, Patient is taking 75/25 mix insulin at home -continue SSI while inpatient   8-acute on chronic L ventricular dysfunction -EF 30-35% -will continue coreg low dose -volume managed by HD -Continue lisinopril -daily weights and strict intake and output -Myoview high risk; will have heart cath later today 11/22 -will follow rec's cardiology   Code Status: Full Family Communication: daughter at bedside  Disposition Plan: remains inpatient; shuntogram and Myoview planned and fistulogram planned for today 11/21; continue heparin drip. Cath later today 11/22   Consultants:  Renal service  Vascular surgery  Cardiology   Procedures:  2-D echo - Left ventricle: The cavity size was mildly dilated. Wall thickness was increased in a pattern of mild LVH. Systolic function was moderately to severely reduced. The estimated ejection fraction was in the range of 30% to 35%. Diffuse hypokinesis. Features are consistent with a pseudonormal left ventricular filling pattern, with concomitant abnormal relaxation and increased  filling pressure (grade 2 diastolic dysfunction). Doppler parameters are consistent with high ventricular filling pressure. - Aortic valve: There was trivial regurgitation. - Mitral valve: Calcified annulus. Mildly thickened leaflets . There was mild regurgitation. - Left atrium: The atrium was severely dilated. - Right ventricle: The cavity size was mildly dilated. Systolic function was moderately reduced. - Right atrium: The atrium was mildly dilated. - Pulmonary arteries: Systolic pressure was moderately increased. PA peak pressure: 55 mm Hg (S).  Myoview : high risk  11/21  Fistulogram: w/o acute abnormalities 11/21  Heart cath 11/22  Antibiotics:  None   HPI/Subjective: Afebrile, no CP, no SOB. Mild LLE discomfort and swelling. Otherwise no acute complaints   Objective: Filed Vitals:   02/05/15 1115 02/05/15 1235  BP: 128/59 128/62  Pulse: 57 58  Temp: 97.5 F (36.4 C) 98 F (36.7 C)  Resp: 20 20    Intake/Output Summary (Last 24 hours) at 02/05/15 1524 Last data filed at 02/05/15 1115  Gross per 24 hour  Intake    240 ml  Output   2495 ml  Net  -2255 ml   Filed Weights   02/04/15 2035 02/05/15 0723 02/05/15 1115  Weight: 75.4 kg (166 lb 3.6 oz) 77.5 kg (170 lb 13.7 oz) 74.7 kg (164 lb 10.9 oz)    Exam:   General:  Afebrile, denies CP, nausea, SOB, vomiting and abd pain. Mild pain and swelling on her LLE still present, but otherwise no acute distress.   Cardiovascular: S1 and S2, no rubs or gallops  Respiratory: good air movement, no wheezing   Abdomen: soft, NT, ND, positive BS  Musculoskeletal: LLE edema, no RLE edema; positive bruit on her left leg AVG  Data Reviewed: Basic Metabolic Panel:  Recent Labs Lab 02/02/15 0510 02/05/15 0342  NA 138 135  K 3.9 4.7  CL 97* 97*  CO2 32 29  GLUCOSE 130* 222*  BUN 32* 40*  CREATININE 8.02* 8.91*  CALCIUM 9.4 9.3   CBC:  Recent Labs Lab 02/02/15 0510 02/02/15 1113 02/03/15 0310  02/04/15 0623 02/05/15 0342  WBC 7.6 7.8 5.8 4.3 3.3*  HGB 9.4* 10.4* 9.3* 9.5* 9.0*  HCT 29.6* 33.3* 29.3* 30.1* 28.5*  MCV 100.3* 100.3* 98.3 99.3 99.0  PLT 99* 100* 102* 106* 130*   Cardiac Enzymes:  Recent Labs Lab 02/02/15 02/02/15 0510 02/02/15 1113  TROPONINI 0.13* 0.11* 0.16*   CBG:  Recent Labs Lab 02/04/15 0733 02/04/15 1232 02/04/15 1744 02/04/15 2034 02/05/15 1204  GLUCAP 182* 195* 230* 186* 96    Studies: Nm Myocar Multi W/spect W/wall Motion / Ef  02/04/2015   There was no ST segment deviation noted during stress.  Defect 1: There is a medium defect of mild severity present in the basal inferior, mid anterior, mid inferior and apex location.  Findings consistent with ischemia.  This is a high risk study.  The left ventricular ejection fraction is severely decreased (<30%).  Nuclear stress EF: 27%.    Ir Shuntogram/ Fistulagram Left Mod Sed  02/04/2015  INDICATION: New left femoral vein DVT with functional left AV thigh graft. Please evaluate for thrombus affecting the thigh graft. EXAM: DIALYSIS AV SHUNTOGRAM/FISTULAGRAM COMPARISON:  Declot left thigh AV graft - 04/14/2013 MEDICATIONS: None. CONTRAST:  15mL OMNIPAQUE IOHEXOL 300 MG/ML  SOLN ANESTHESIA/SEDATION: None FLUOROSCOPY TIME:  24 seconds ([id number] mGy) COMPLICATIONS: None immediate PROCEDURE: The left arm dialysis graft was prepped with Betadine in a sterile fashion, and a sterile drape was applied covering the operative field. A diagnostic shunt study was performed via an 18 gauge angiocatheter introduced into venous outflow. Venous drainage was assessed to the level of the central veins in the chest. Proximal shunt was studied by reflux maneuver with temporary compression of venous outflow. The angiocath was removed and hemostasis was achieved with manual compression. A dressing was placed. The patient tolerated the procedure well without immediate post procedural complication. FINDINGS: The left thigh AV  graft is widely patent. There is mild pseudo aneurysmal dilatation involving the venous stick zone of the graft without evidence of mural thrombosis. The remainder the venous limb is widely patent. There is a very mild (under 30%) narrowing involving the central most aspect of the venous limb regional to the anastomosis, not resulting in a hemodynamically significant stenosis. Overlapping stents are seen within the left external iliac vein. Both stents appear widely patent though there is a very minimal amount of InStent stenosis within the more peripheral overlapping venous stent, not resulting in a hemodynamically significant stenosis. The remainder of the venous system of the left lower extremity is widely patent to the level of the inferior aspect of the right atrium. Reflux shuntogram demonstrates wide patency of the arterial limb and arterial anastomosis. There is mild pseudo aneurysmal dilatation within the arterial stick zone of the graft without evidence of mural thrombus. IMPRESSION: Widely patent left thigh graft without evidence of a hemodynamically significant peripheral or central stenosis. No intervention performed. ACCESS: This access remains amenable to future percutaneous interventions as clinically indicated. Electronically Signed   By: Sandi Mariscal M.D.   On: 02/04/2015 13:12    Scheduled Meds: . calcitRIOL  1.5 mcg Oral Q T,Th,Sa-HD  . calcium acetate  667 mg Oral TID WC  . carvedilol  3.125 mg Oral BID WC  . [START ON 02/06/2015]  ceFAZolin (ANCEF) IV  2 g Intravenous to SS-Proc  . cinacalcet  60 mg Oral Q breakfast  . clopidogrel  75 mg Oral Daily  . darbepoetin (ARANESP) injection - DIALYSIS  60 mcg Intravenous Q Tue-HD  . docusate sodium  50 mg Oral BID  . gabapentin  300 mg Oral QHS  . insulin aspart  0-9 Units Subcutaneous TID WC  . ketoconazole  1 application Topical Daily  . lisinopril  20 mg Oral Daily  . multivitamin  1 tablet Oral QHS  . ofloxacin  1 drop Both Eyes QID   . pantoprazole  40 mg Oral Daily  . polyethylene glycol  17 g Oral Daily  . pravastatin  40 mg Oral Daily  . rOPINIRole  0.5 mg Oral QHS  . sodium chloride  3 mL Intravenous Q12H  . sodium chloride  3 mL Intravenous Q12H   Continuous Infusions: . sodium chloride    . heparin 1,150 Units/hr (02/04/15 2151)    Principal Problem:   DVT (deep venous thrombosis) (HCC) Active Problems:   HLD (hyperlipidemia)   ANEMIA   Essential hypertension   Cerebral artery occlusion with cerebral infarction ([place])   ESRD on dialysis ([place])   Secondary renal hyperparathyroidism ([place])   PAD (peripheral artery disease) ([place])   Type 2 diabetes, controlled, with neuropathy (HCC)   Leukocytosis   Elevated troponin   SOB (shortness of breath)   Congestive dilated cardiomyopathy ([place])    Time spent: 35 minutes   Barton Dubois  [facility] Pager [phone]. If 7PM-7AM, please contact night-coverage at [email or web address], password [facility] 02/05/2015, 3:24 PM  LOS: 4 days

## 2015-02-05 NOTE — Progress Notes (Signed)
Site area: rt groin fa sheath Site Prior to Removal:  Level  0 Pressure Applied For:  20 minutes Manual:   yes Patient Status During Pull:  stable Post Pull Site:  Level  0 Post Pull Instructions Given:  yes Post Pull Pulses Present: yes Dressing Applied:  tegaderm Bedrest begins @  1800 Comments:  IV saline locked

## 2015-02-05 NOTE — Procedures (Signed)
Patient was seen on dialysis and the procedure was supervised. BFR 400 Via Left thigh AVG BP is 130/52.  Patient appears to be tolerating treatment well.  She is due for cardiac catheter due to high risk myoview.  Currently she is hemodynamically stable.

## 2015-02-05 NOTE — Progress Notes (Signed)
OT Cancellation Note  Patient Details Name: Stefanie [name] MRN: [id number] DOB: [birth year]   Cancelled Treatment:    Reason Eval/Treat Not Completed: Patient at procedure or test/ unavailable (HD) OT to follow acutely and return at a more appropriate time. OF note : possible procedure Wednesday by Dr. Oneida Alar. Patient will not be seen on Thursday 02/07/15.   Vonita Moss   OTR/L Pager: [phone] Office: [phone] .  02/05/2015, 8:11 AM

## 2015-02-05 NOTE — Progress Notes (Signed)
Notified Dr.Fields I didn't see orders for schedule procedure tomorrow for shuntogram to left thigh. I verified with him about order to restart heparin drip. He does NOT want heparin drip started due to procedure in the morning. I will notified pharmacy also about heparin. Patient will also be made NPO.

## 2015-02-05 NOTE — Progress Notes (Signed)
Pt seen in HD unit today. She has no chest pain. Reviewed plans for cath with her - high risk Myoview. Discussed with her daughter yesterday afternoon. Questions answered. Cath this afternoon.   Sherren Mocha 02/05/2015 8:55 AM

## 2015-02-05 NOTE — H&P (View-Only) (Signed)
Myoview scan is high-risk with mixed infarct and ischemia. I have recommended cardiac cath and possible angioplasty/stenting. Discussed with her daughter who works on [place] at [employer] at the patient's request. I have reviewed the risks, indications, and alternatives to cardiac catheterization and possible PCI with the patient. Risks include but are not limited to bleeding, infection, vascular injury, stroke, myocardial infection, arrhythmia, kidney injury, radiation-related injury in the case of prolonged fluoroscopy use, emergency cardiac surgery, and death. The patient understands the risks of serious complication is low ([id number]).   Sherren Mocha 02/04/2015 5:52 PM

## 2015-02-05 NOTE — Progress Notes (Signed)
ANTICOAGULATION CONSULT NOTE - Follow Up Consult  Pharmacy Consult for heparin Indication: DVT  No Known Allergies  Patient Measurements: Height: 5\' 4"  (162.6 cm) Weight: 164 lb 10.9 oz (74.7 kg) IBW/kg (Calculated) : 54.7 Heparin Dosing Weight: 70 kg  Vital Signs: Temp: 98 F (36.7 C) (11/22 1235) Temp Source: Oral (11/22 1235) BP: 141/110 mmHg (11/22 1800) Pulse Rate: 62 (11/22 1800)  Labs:  Recent Labs  02/03/15 0310  02/04/15 0623 02/04/15 2052 02/05/15 0342  HGB 9.3*  --  9.5*  --  9.0*  HCT 29.3*  --  30.1*  --  28.5*  PLT 102*  --  106*  --  130*  LABPROT  --   --   --   --  14.5  INR  --   --   --   --  1.11  HEPARINUNFRC 0.62  < > >2.20* 0.14* 0.33  CREATININE  --   --   --   --  8.91*  < > = values in this interval not displayed.  Estimated Creatinine Clearance: 5.9 mL/min (by C-G formula based on Cr of 8.91).  Assessment: 69 year old female admitted 02/01/2015 from [facility] found to have common femoral vein DVT.  Patient continues on heparin pending plans for shuntogram per vascular.  Heparin was therapeutic on 1150 units/hr.  Hospital course complicated by high risk Myoview now s/p cardiac cath. Sheath pulled at 1708.   Goal of Therapy:  Heparin level 0.3-0.7 units/ml Monitor platelets by anticoagulation protocol: Yes   Plan:  Restart heparin at 1200 units/hr at 0100 11/23 HL at 0700 Daily heparin level and CBC Monitor for s/sx of bleeding F/U [facility] [address] Plans  Levester Fresh, PharmD, BCPS, [facility] Clinical Pharmacist Pager [phone] 02/05/2015 6:09 PM

## 2015-02-05 NOTE — Progress Notes (Signed)
ANTICOAGULATION CONSULT NOTE - Follow Up Consult  Pharmacy Consult for heparin Indication: DVT  No Known Allergies  Patient Measurements: Height: 5\' 4"  (162.6 cm) Weight: 164 lb 10.9 oz (74.7 kg) IBW/kg (Calculated) : 54.7 Heparin Dosing Weight: 70 kg  Vital Signs: Temp: 98 F (36.7 C) (11/22 1235) Temp Source: Oral (11/22 1235) BP: 128/62 mmHg (11/22 1235) Pulse Rate: 58 (11/22 1235)  Labs:  Recent Labs  02/03/15 0310  02/04/15 0623 02/04/15 2052 02/05/15 0342  HGB 9.3*  --  9.5*  --  9.0*  HCT 29.3*  --  30.1*  --  28.5*  PLT 102*  --  106*  --  130*  LABPROT  --   --   --   --  14.5  INR  --   --   --   --  1.11  HEPARINUNFRC 0.62  < > >2.20* 0.14* 0.33  CREATININE  --   --   --   --  8.91*  < > = values in this interval not displayed.  Estimated Creatinine Clearance: 5.9 mL/min (by C-G formula based on Cr of 8.91).  Assessment: 69 year old female admitted 02/01/2015 from [facility] found to have common femoral vein DVT.  Patient continues on heparin pending plans for shuntogram per vascular.  Heparin is therapeutic on 1150 units/hr.  Hospital course complicated by high risk Myoview now prompting cardiac cath (planned for this afternoon).  Goal of Therapy:  Heparin level 0.3-0.7 units/ml Monitor platelets by anticoagulation protocol: Yes   Plan:  Continue heparin at 1150 units/hr. Follow-up for restart of heparin post-cath for DVT indication. Daily heparin level and CBC Monitor for s/sx of bleeding  [employer], Pharm.D., BCPS Clinical Pharmacist Pager [phone] 02/05/2015 2:08 PM

## 2015-02-06 ENCOUNTER — Encounter (HOSPITAL_COMMUNITY): Payer: Self-pay | Admitting: Interventional Cardiology

## 2015-02-06 ENCOUNTER — Ambulatory Visit (HOSPITAL_COMMUNITY): Admission: RE | Admit: 2015-02-06 | Payer: Medicare Other | Source: Ambulatory Visit | Admitting: Vascular Surgery

## 2015-02-06 ENCOUNTER — Encounter (HOSPITAL_COMMUNITY)
Admission: AD | Disposition: A | Discharge status: Rehabilitation Facility | Payer: Self-pay | Source: Other Acute Inpatient Hospital | Attending: Internal Medicine

## 2015-02-06 DIAGNOSIS — I5032 Chronic diastolic (congestive) heart failure: Secondary | ICD-10-CM

## 2015-02-06 DIAGNOSIS — I509 Heart failure, unspecified: Secondary | ICD-10-CM

## 2015-02-06 DIAGNOSIS — T82818A Embolism of vascular prosthetic devices, implants and grafts, initial encounter: Secondary | ICD-10-CM

## 2015-02-06 DIAGNOSIS — I5043 Acute on chronic combined systolic (congestive) and diastolic (congestive) heart failure: Secondary | ICD-10-CM

## 2015-02-06 HISTORY — PX: PERIPHERAL VASCULAR CATHETERIZATION: SHX172C

## 2015-02-06 LAB — GLUCOSE, CAPILLARY
GLUCOSE-CAPILLARY: 130 mg/dL — AB (ref 65–99)
Glucose-Capillary: 190 mg/dL — ABNORMAL HIGH (ref 65–99)
Glucose-Capillary: 218 mg/dL — ABNORMAL HIGH (ref 65–99)
Glucose-Capillary: 253 mg/dL — ABNORMAL HIGH (ref 65–99)
Glucose-Capillary: 161 mg/dL — ABNORMAL HIGH (ref 65–99)

## 2015-02-06 LAB — CBC
HCT: 28.5 % — ABNORMAL LOW (ref 36.0–46.0)
Hemoglobin: 9.2 g/dL — ABNORMAL LOW (ref 12.0–15.0)
MCH: 32.3 pg (ref 26.0–34.0)
MCHC: 32.3 g/dL (ref 30.0–36.0)
MCV: 100 fL (ref 78.0–100.0)
Platelets: 125 10*3/uL — ABNORMAL LOW (ref 150–400)
RBC: 2.85 MIL/uL — AB (ref 3.87–5.11)
RDW: 15.2 % (ref 11.5–15.5)
WBC: 3.6 10*3/uL — AB (ref 4.0–10.5)

## 2015-02-06 LAB — BASIC METABOLIC PANEL
ANION GAP: 9 (ref 5–15)
BUN: 24 mg/dL — ABNORMAL HIGH (ref 6–20)
CHLORIDE: 99 mmol/L — AB (ref 101–111)
CO2: 29 mmol/L (ref 22–32)
CREATININE: 5.93 mg/dL — AB (ref 0.44–1.00)
Calcium: 9.3 mg/dL (ref 8.9–10.3)
GFR calc non Af Amer: 7 mL/min — ABNORMAL LOW (ref >60)
GFR, EST AFRICAN AMERICAN: 8 mL/min — AB (ref >60)
Glucose, Bld: 201 mg/dL — ABNORMAL HIGH (ref 65–99)
Potassium: 4.5 mmol/L (ref 3.5–5.1)
SODIUM: 137 mmol/L (ref 135–145)

## 2015-02-06 SURGERY — A/V SHUNTOGRAM/FISTULAGRAM

## 2015-02-06 MED ORDER — HEPARIN SODIUM (PORCINE) 1000 UNIT/ML IJ SOLN
INTRAMUSCULAR | Status: AC
  Filled 2015-02-06: qty 1

## 2015-02-06 MED ORDER — HYDRALAZINE HCL 20 MG/ML IJ SOLN: 5.0000 mg | INTRAMUSCULAR | Status: DC | PRN

## 2015-02-06 MED ORDER — HEPARIN SODIUM (PORCINE) 1000 UNIT/ML IJ SOLN
INTRAMUSCULAR | Status: DC | PRN
  Administered 2015-02-06: 3000 [IU] via INTRAVENOUS

## 2015-02-06 MED ORDER — ACETAMINOPHEN 650 MG RE SUPP: 325.0000 mg | RECTAL | Status: DC | PRN

## 2015-02-06 MED ORDER — HEPARIN (PORCINE) IN NACL 2-0.9 UNIT/ML-% IJ SOLN
INTRAMUSCULAR | Status: AC
  Filled 2015-02-06: qty 500

## 2015-02-06 MED ORDER — ACETAMINOPHEN 325 MG PO TABS: 325.0000 mg | ORAL_TABLET | ORAL | Status: DC | PRN

## 2015-02-06 MED ORDER — IODIXANOL 320 MG/ML IV SOLN
INTRAVENOUS | Status: DC | PRN
  Administered 2015-02-06: 35 mL via INTRA_ARTERIAL

## 2015-02-06 MED ORDER — METOPROLOL TARTRATE 1 MG/ML IV SOLN: 2.0000 mg | INTRAVENOUS | Status: DC | PRN

## 2015-02-06 MED ORDER — LIDOCAINE HCL (PF) 1 % IJ SOLN
INTRAMUSCULAR | Status: DC | PRN
  Administered 2015-02-06: 12:00:00

## 2015-02-06 MED ORDER — LIDOCAINE HCL (PF) 1 % IJ SOLN
INTRAMUSCULAR | Status: AC
  Filled 2015-02-06: qty 30

## 2015-02-06 MED ORDER — ONDANSETRON HCL 4 MG/2ML IJ SOLN: 4.0000 mg | Freq: Four times a day (QID) | INTRAMUSCULAR | Status: DC | PRN

## 2015-02-06 MED ORDER — LABETALOL HCL 5 MG/ML IV SOLN: 10.0000 mg | INTRAVENOUS | Status: DC | PRN

## 2015-02-06 SURGICAL SUPPLY — 15 items
BAG SNAP BAND KOVER 36X36 (MISCELLANEOUS) ×2 IMPLANT
BALLN MUSTANG 8.0X40 75 (BALLOONS) ×2
BALLOON MUSTANG 8.0X40 75 (BALLOONS) ×1 IMPLANT
COVER DOME SNAP 22 D (MISCELLANEOUS) ×2 IMPLANT
COVER PRB 48X5XTLSCP FOLD TPE (BAG) ×1 IMPLANT
COVER PROBE 5X48 (BAG) ×1
KIT ENCORE 26 ADVANTAGE (KITS) ×2 IMPLANT
KIT MICROINTRODUCER STIFF 5F (SHEATH) ×2 IMPLANT
PROTECTION STATION PRESSURIZED (MISCELLANEOUS) ×2
SHEATH PINNACLE R/O II 6F 4CM (SHEATH) ×2 IMPLANT
STATION PROTECTION PRESSURIZED (MISCELLANEOUS) ×1 IMPLANT
STOPCOCK MORSE 400PSI 3WAY (MISCELLANEOUS) ×2 IMPLANT
TRAY PV CATH (CUSTOM PROCEDURE TRAY) ×2 IMPLANT
TUBING CIL FLEX 10 FLL-RA (TUBING) ×2 IMPLANT
WIRE BENTSON .035X145CM (WIRE) ×4 IMPLANT

## 2015-02-06 NOTE — Progress Notes (Addendum)
[facility]                                                                              Patient Demographics  Stefanie [name], is a 69 y.o. female, DOB - Jan 16, 1946, [id number]  Admit date - 02/01/2015   Admitting Physician Ivor Costa, MD  Outpatient Primary MD for the patient is [name], Stefanie Charters, MD  LOS - 5   No chief complaint on file.      Brief HPI  Patient is a 69 year old female with ESRD on HD, TTS, CVA, CHF, CAD, hypertension, hyperlipidemia, diabetes presented with left leg pain and shortness of breath. Patient was transferred from [facility] due to lack of dialysis center. In [facility] ER, patient was found to have left common femoral vein DVT, CT angiogram of the chest was negative for PE. Patient was admitted. Nephrology and vascular surgery were consulted.    Assessment & Plan    Principal Problem:  1-Left femoral vein DVT:  - Patient was placed on heparin drip, vascular surgery was consulted. - per VVS and following normal fistulogram, no needs for thrombolysis  - Patient underwent left thigh shuntogram with angioplasty of the venous outflow stenosis today - Reviewed nephrology and cardiology recommendations, patient to start on Coumadin, plan to stop Plavix once she is therapeutic on Coumadin.  - Discussed with vascular surgery, Dr. Oneida Alar, recommended to start Coumadin tomorrow  2-ESRD: renal on board - will continue HD T-T-S, nephrology following - Per vascular surgery, Dr Oneida Alar,  patient's left thigh graft is ready to use and would be unable to further percutaneous intervention necessary. Most likely the patient will not be a candidate for any venous operative revision and will only be a candidate for percutaneous procedures due to the fact that she has stents extending from the common femoral vein all the way up to the cava.  3-elevated troponin and DOE: cardiology consulted -2-D echo showed severe global hypokinesis and  reduce EF -will continue lisinopril and b-blocker -Myoview abnormal and high risk; patient underwent cardiac cath on 11/22 which showed left ventricular EF less than 25%, global hypokinesis of the left ventricle, mid RCA lesion 100% stenosis, first diagonal lesion 100 % stenosis  4-anemia of chronic disease:  -ESA and IV iron as per renal discretion -Hgb stable  5-HLD: continue statins  6-Hx of cerebral artery occlusion with cerebral infarction [facility]): no acute new issues. History of stroke in 1985 and 1990 -on Plavix  7-DM-II: Last A1c not on record, Patient is taking 75/25 mix insulin at home -continue SSI while inpatient   8-acute on chronic L ventricular dysfunction -EF 30-35%, volume management with the hemodialysis, continue ACE inhibitor, beta blocker Cardiology following  Code Status: Full code  Family Communication: Discussed in detail with the patient, all imaging results, lab results explained to the patient    Disposition Plan:none  Time Spent in minutes  69minutes  Procedures  Echo EF 30-35% with grade 2 diastolic dysfunction   stress Myoview high-risk 11/21 Fistulogram 11/21 Cardiac cath 11/22 Left thigh shuntogram with angioplasty on 11/23  Consults   Nephrology   cardiology Vascular surgery  DVT  Prophylaxis  heparin   Medications  Scheduled Meds: . calcitRIOL  1.5 mcg Oral Q T,Th,Sa-HD  . calcium acetate  667 mg Oral TID WC  . carvedilol  3.125 mg Oral BID WC  .  ceFAZolin (ANCEF) IV  2 g Intravenous to SS-Proc  . cinacalcet  90 mg Oral Q breakfast  . clopidogrel  75 mg Oral Daily  . darbepoetin (ARANESP) injection - DIALYSIS  60 mcg Intravenous Q Tue-HD  . docusate sodium  50 mg Oral BID  . gabapentin  300 mg Oral QHS  . insulin aspart  0-9 Units Subcutaneous TID WC  . ketoconazole  1 application Topical Daily  . latanoprost  1 drop Left Eye QHS  . lisinopril  20 mg Oral Daily  . multivitamin  1 tablet Oral QHS  . ofloxacin  1 drop Both  Eyes QID  . pantoprazole  40 mg Oral Daily  . polyethylene glycol  17 g Oral Daily  . pravastatin  40 mg Oral Daily  . rOPINIRole  0.5 mg Oral QHS  . sodium chloride  3 mL Intravenous Q12H   Continuous Infusions:  PRN Meds:.sodium chloride, sodium chloride, acetaminophen **OR** acetaminophen, albuterol, celecoxib, diclofenac sodium, guaiFENesin, heparin, hydrALAZINE, labetalol, lidocaine (PF), lidocaine-prilocaine, metoprolol, morphine injection, nitroGLYCERIN, ondansetron, ondansetron **OR** [DISCONTINUED] ondansetron (ZOFRAN) IV, pentafluoroprop-tetrafluoroeth, traMADol   Antibiotics   Anti-infectives    Start     Dose/Rate Route Frequency Ordered Stop   02/06/15 0800  ceFAZolin (ANCEF) IVPB 2 g/50 mL premix     2 g 100 mL/hr over 30 Minutes Intravenous To ShortStay Procedural 02/04/15 1630 02/07/15 0800        Subjective:   Stefanie Detore was seen and examined today.  Sitting up in the chair, patient seen earlier this morning, denied any complaints.  denies dizziness, chest pain, shortness of breath, abdominal pain, N/V/D/C, new weakness, numbess, tingling. No acute events overnight.    Objective:   Blood pressure 150/51, pulse 68, temperature 97.5 F (36.4 C), temperature source Oral, resp. rate 19, height 5\' 4"  (1.626 m), weight 76 kg (167 lb 8.8 oz), SpO2 100 %.  Wt Readings from Last 3 Encounters:  02/06/15 76 kg (167 lb 8.8 oz)  03/23/14 88.451 kg (195 lb)  03/01/14 88.451 kg (195 lb)     Intake/Output Summary (Last 24 hours) at 02/06/15 1401 Last data filed at 02/06/15 0655  Gross per 24 hour  Intake      0 ml  Output      0 ml  Net      0 ml    Exam  General: Alert and oriented x 3, NAD  HEENT:  PERRLA, EOMI, Anicteric Sclera, mucous membranes moist.   Neck: Supple, no JVD, no masses  CVS: S1 S2 auscultated, no rubs, murmurs or gallops. Regular rate and rhythm.  Respiratory: Clear to auscultation bilaterally, no wheezing, rales or rhonchi  Abdomen:  Soft, nontender, nondistended, + bowel sounds  Ext: no cyanosis clubbing,1 + edema  Neuro: no new deficits   Skin: No rashes  Psych: Normal affect and demeanor, alert and oriented x3    Data Review   Micro Results No results found for this or any previous visit (from the past 240 hour(s)).  Radiology Reports Dg Chest 2 View  02/02/2015  CLINICAL DATA:  Shortness of breath, cough, and weakness for 2 days. EXAM: CHEST  2 VIEW COMPARISON:  02/01/2015 FINDINGS: Moderate cardiomegaly and diffuse pulmonary vascular congestion remains stable. No evidence of acute infiltrate  or pleural effusion. Short segment of wire again seen overlying the right clavicle. Endovascular stent seen in left axillary region. IMPRESSION: Stable cardiomegaly and pulmonary vascular congestion. No acute findings. Electronically Signed   By: Earle Gell M.D.   On: 02/02/2015 00:49   Nm Myocar Multi W/spect W/wall Motion / Ef  02/04/2015   There was no ST segment deviation noted during stress.  Defect 1: There is a medium defect of mild severity present in the basal inferior, mid anterior, mid inferior and apex location.  Findings consistent with ischemia.  This is a high risk study.  The left ventricular ejection fraction is severely decreased (<30%).  Nuclear stress EF: 27%.    Ir Shuntogram/ Fistulagram Left Mod Sed  02/04/2015  INDICATION: New left femoral vein DVT with functional left AV thigh graft. Please evaluate for thrombus affecting the thigh graft. EXAM: DIALYSIS AV SHUNTOGRAM/FISTULAGRAM COMPARISON:  Declot left thigh AV graft - 04/14/2013 MEDICATIONS: None. CONTRAST:  71mL OMNIPAQUE IOHEXOL 300 MG/ML  SOLN ANESTHESIA/SEDATION: None FLUOROSCOPY TIME:  24 seconds ([id number] mGy) COMPLICATIONS: None immediate PROCEDURE: The left arm dialysis graft was prepped with Betadine in a sterile fashion, and a sterile drape was applied covering the operative field. A diagnostic shunt study was performed via an 18 gauge  angiocatheter introduced into venous outflow. Venous drainage was assessed to the level of the central veins in the chest. Proximal shunt was studied by reflux maneuver with temporary compression of venous outflow. The angiocath was removed and hemostasis was achieved with manual compression. A dressing was placed. The patient tolerated the procedure well without immediate post procedural complication. FINDINGS: The left thigh AV graft is widely patent. There is mild pseudo aneurysmal dilatation involving the venous stick zone of the graft without evidence of mural thrombosis. The remainder the venous limb is widely patent. There is a very mild (under 30%) narrowing involving the central most aspect of the venous limb regional to the anastomosis, not resulting in a hemodynamically significant stenosis. Overlapping stents are seen within the left external iliac vein. Both stents appear widely patent though there is a very minimal amount of InStent stenosis within the more peripheral overlapping venous stent, not resulting in a hemodynamically significant stenosis. The remainder of the venous system of the left lower extremity is widely patent to the level of the inferior aspect of the right atrium. Reflux shuntogram demonstrates wide patency of the arterial limb and arterial anastomosis. There is mild pseudo aneurysmal dilatation within the arterial stick zone of the graft without evidence of mural thrombus. IMPRESSION: Widely patent left thigh graft without evidence of a hemodynamically significant peripheral or central stenosis. No intervention performed. ACCESS: This access remains amenable to future percutaneous interventions as clinically indicated. Electronically Signed   By: Sandi Mariscal M.D.   On: 02/04/2015 13:12    CBC  Recent Labs Lab 02/02/15 1113 02/03/15 0310 02/04/15 0623 02/05/15 0342 02/06/15 0453  WBC 7.8 5.8 4.3 3.3* 3.6*  HGB 10.4* 9.3* 9.5* 9.0* 9.2*  HCT 33.3* 29.3* 30.1* 28.5*  28.5*  PLT 100* 102* 106* 130* 125*  MCV 100.3* 98.3 99.3 99.0 100.0  MCH 31.3 31.2 31.4 31.3 32.3  MCHC 31.2 31.7 31.6 31.6 32.3  RDW 15.4 15.6* 15.3 15.2 15.2    Chemistries   Recent Labs Lab 02/02/15 0510 02/05/15 0342 02/06/15 0453  NA 138 135 137  K 3.9 4.7 4.5  CL 97* 97* 99*  CO2 32 29 29  GLUCOSE 130* 222* 201*  BUN 32*  40* 24*  CREATININE 8.02* 8.91* 5.93*  CALCIUM 9.4 9.3 9.3   ------------------------------------------------------------------------------------------------------------------ estimated creatinine clearance is 8.9 mL/min (by C-G formula based on Cr of 5.93). ------------------------------------------------------------------------------------------------------------------ No results for input(s): HGBA1C in the last 72 hours. ------------------------------------------------------------------------------------------------------------------ No results for input(s): CHOL, HDL, LDLCALC, TRIG, CHOLHDL, LDLDIRECT in the last 72 hours. ------------------------------------------------------------------------------------------------------------------ No results for input(s): TSH, T4TOTAL, T3FREE, THYROIDAB in the last 72 hours.  Invalid input(s): FREET3 ------------------------------------------------------------------------------------------------------------------ No results for input(s): VITAMINB12, FOLATE, FERRITIN, TIBC, IRON, RETICCTPCT in the last 72 hours.  Coagulation profile  Recent Labs Lab 02/02/15 0510 02/05/15 0342  INR 1.26 1.11    No results for input(s): DDIMER in the last 72 hours.  Cardiac Enzymes  Recent Labs Lab 02/02/15 02/02/15 0510 02/02/15 1113  TROPONINI 0.13* 0.11* 0.16*   ------------------------------------------------------------------------------------------------------------------ Invalid input(s): POCBNP   Recent Labs  02/05/15 1204 02/05/15 1740 02/05/15 2143 02/06/15 0641 02/06/15 0859 02/06/15 1253   GLUCAP 96 101* 206* 190* 218* 130*     Favor Kreh M.D. [facility] 02/06/2015, 2:01 PM  Pager: [phone] Between 7am to 7pm - call Pager - [phone]  After 7pm go to [email or web address] - password [email or web address]  Call night coverage person covering after 7pm

## 2015-02-06 NOTE — Plan of Care (Signed)
Problem: Phase II Progression Outcomes Goal: Therapeutic drug levels for anticoagulation Outcome: Completed/Met Date Met:  02/06/15 On plavix, to start warfarin; SCDs.

## 2015-02-06 NOTE — Progress Notes (Signed)
Patient ID: Stefanie [name], female   DOB: [birth year], 69 y.o.   MRN: [id number]  [facility] Progress Note    Subjective:   Feels well and results of cardiac cath and Cardiology recommendations noted.   Objective:   BP 167/60 mmHg  Pulse 70  Temp(Src) 98.1 F (36.7 C) (Oral)  Resp 20  Ht 5\' 4"  (1.626 m)  Wt 76 kg (167 lb 8.8 oz)  BMI 28.75 kg/m2  SpO2 97%  Intake/Output: I/O last 3 completed shifts: In: 0  Out: 2495 [Other:2495]   Intake/Output this shift:    Weight change: 2.1 kg (4 lb 10.1 oz)  Physical Exam: Gen:WD WN AAF in NAd CVS:no rub Resp:cta [id number] Ext:no edema, AVG +T/B  Labs: BMET  Recent Labs Lab 02/02/15 0510 02/05/15 0342 02/06/15 0453  NA 138 135 137  K 3.9 4.7 4.5  CL 97* 97* 99*  CO2 32 29 29  GLUCOSE 130* 222* 201*  BUN 32* 40* 24*  CREATININE 8.02* 8.91* 5.93*  CALCIUM 9.4 9.3 9.3   CBC  Recent Labs Lab 02/03/15 0310 02/04/15 0623 02/05/15 0342 02/06/15 0453  WBC 5.8 4.3 3.3* 3.6*  HGB 9.3* 9.5* 9.0* 9.2*  HCT 29.3* 30.1* 28.5* 28.5*  MCV 98.3 99.3 99.0 100.0  PLT 102* 106* 130* 125*    @IMGRELPRIORS @ Medications:    . calcitRIOL  1.5 mcg Oral Q T,Th,Sa-HD  . calcium acetate  667 mg Oral TID WC  . carvedilol  3.125 mg Oral BID WC  .  ceFAZolin (ANCEF) IV  2 g Intravenous to SS-Proc  . cinacalcet  90 mg Oral Q breakfast  . clopidogrel  75 mg Oral Daily  . darbepoetin (ARANESP) injection - DIALYSIS  60 mcg Intravenous Q Tue-HD  . docusate sodium  50 mg Oral BID  . gabapentin  300 mg Oral QHS  . insulin aspart  0-9 Units Subcutaneous TID WC  . ketoconazole  1 application Topical Daily  . latanoprost  1 drop Left Eye QHS  . lisinopril  20 mg Oral Daily  . multivitamin  1 tablet Oral QHS  . ofloxacin  1 drop Both Eyes QID  . pantoprazole  40 mg Oral Daily  . polyethylene glycol  17 g Oral Daily  . pravastatin  40 mg Oral Daily  . rOPINIRole  0.5 mg Oral QHS  . sodium chloride  3 mL Intravenous  Q12H   Dialysis Orders: [facility] T[facility] 3.5 hr 2 K 2 Ca profile 4 400/A 1.5 EDW 75 Heparin 4000 Mircera 50 q 2 w ksgiven 11/8 calcitriol 1.5 no Fe Recent: Hgb 10.8 iPTH 690 Ca/P ok  Assessment/ Plan:   1. L femoral vein DVT - on IV heparin. Question of whether DVT is proximal or distal to AVG venous anastamosis. VVS following, thoughts are that clot is distal to AVG. Had normal fistulogram today per IR.Continue heparin drip and started on coumadin.  Would stop plavix once she is therapeutic on coumadin. 2. ESRD - TTS - [place]. Next HD on Friday. 3. HTN - on lisinopril 20 mg PO Daily. BP controlled.  4. Volume - Last HD 11/20. Net UF 1800. Post wt 80.2 kg. Roughly 5 kg over OP EDW. Question accuracy. No edema.  EDW recently lowered in HD center.  5. Anemia - Hgb down some but stablizing - for redose ESA next week  6. Metabolic bone disease - Continue calcitriol/binders/sensipar 7. Nutrition - renal carb mod diet/vitamin- unintentional weight loss - add nepro 8. DM -  per primary 9. Thrombocytopenia - Platelets 106 today. Will follow CBC.  10. CAD/Elevated Troponin/Acute on Chronic Systolic Dysfunction: Cardiology following.Troponin elevation flat. Echo done 11/19 shows Moderate to severe global reduction in LV function, grade 2 diastolic dysfunction with elevated LV filling pressure. EF 30-35%. High risk-Stress test which led to cardiac cath 02/05/15 revealing severe LV dysfunction out of proportion to CAD now on coreg.  multivessel CAD with chronic lesions, nothing acute.  Seen by Cardiology without plans for any further intervention.     Donetta Potts, MD [place] Pager [phone] 02/06/2015, 10:35 AM

## 2015-02-06 NOTE — Evaluation (Signed)
Physical Therapy Evaluation Patient Details Name: Stefanie [name] MRN: [id number] DOB: 1945-09-25 Today's Date: 02/06/2015   History of Present Illness  69 yo female admitted with DVT, s/p L heart cath pending procedure 02/06/15 with Dr fields. PMH: HTN, ESRD, DMII, SOB PAD,   Clinical Impression  Pt was very agreeable upon arrival. She presents with general weakness and decreased balance and safety during transfers. Vital signs were stable throughout activity. Very motivated to return home. Pt would benefit from skilled PT to improve strength, safety awareness, and functional mobility to increase independence. Education on transfers, mobility, and POC were provided for patient. Pt may benefit most from CIR, pt verbalized agreement with POC.     Follow Up Recommendations CIR    Equipment Recommendations       Recommendations for Other Services Rehab consult     Precautions / Restrictions Precautions Precautions: Fall Restrictions Weight Bearing Restrictions: No Other Position/Activity Restrictions: Confirmed with RN no longer on bedrest      Mobility  Bed Mobility Overal bed mobility: Needs Assistance Bed Mobility: Supine to Sit     Supine to sit: Supervision     General bed mobility comments: HOB elevated, cues to push from bed, required use of bed rail  Transfers Overall transfer level: Needs assistance Equipment used: 4-wheeled walker Transfers: Sit to/from [employer] Transfers Sit to Stand: Min assist         General transfer comment: cues for hand and feet placement, required min assist to power up. Needed two trials to stand, second trial was successful when feet moved closer to bed. Pt required VCs to stand straight and lift head during stand pivot  Ambulation/Gait                Stairs            Wheelchair Mobility    Modified Rankin (Stroke Patients Only)       Balance Overall balance assessment: Needs  assistance Sitting-balance support: Bilateral upper extremity supported;Feet supported Sitting balance-Leahy Scale: Poor   Postural control: Other (comment) (forward lean in sitting, needed VC to sit up and lift head) Standing balance support: Bilateral upper extremity supported Standing balance-Leahy Scale: Fair Standing balance comment: able to stand without support but required RW when shifting weight                             Pertinent Vitals/Pain Pain Assessment: No/denies pain    Home Living Family/patient expects to be discharged to:: Private residence Living Arrangements: Alone Available Help at Discharge: Personal care attendant (M-Sunday 2.5 every day except monday 3 hours) Type of Home: Apartment Home Access: Elevator     Home Layout: One level Home Equipment: Walker - 4 wheels;Shower seat Additional Comments: pt has an aide 7 days a week for 2.5 hours except monday she has 3 hours (A). Pt otherwise alone per patient    Prior Function Level of Independence: Independent with assistive device(s)               Hand Dominance   Dominant Hand: Right    Extremity/Trunk Assessment               Lower Extremity Assessment: Generalized weakness      Cervical / Trunk Assessment: Kyphotic  Communication   Communication: No difficulties  Cognition Arousal/Alertness: Awake/alert Behavior During Therapy: WFL for tasks assessed/performed Overall Cognitive Status: Within Functional Limits for tasks assessed  General Comments      Exercises        Assessment/Plan    PT Assessment Patient needs continued PT services  PT Diagnosis Difficulty walking;Generalized weakness   PT Problem List Decreased strength;Decreased activity tolerance;Decreased balance;Decreased mobility;Decreased safety awareness  PT Treatment Interventions DME instruction;Gait training;Functional mobility training;Therapeutic  exercise;Patient/family education   PT Goals (Current goals can be found in the Care Plan section) Acute Rehab PT Goals Patient Stated Goal: Pt wanted rehab before returning home PT Goal Formulation: With patient Time For Goal Achievement: 02/13/15 Potential to Achieve Goals: Good    Frequency Min 3X/week   Barriers to discharge Decreased caregiver support Lives alone    Co-evaluation               End of Session   Activity Tolerance: Patient tolerated treatment well Patient left: in chair;with call bell/phone within reach Nurse Communication: Mobility status         Time: [id number] PT Time Calculation (min) (ACUTE ONLY): 16 min   Charges:   PT Evaluation $Initial PT Evaluation Tier I: 1 Procedure     PT G CodesHaynes Bast Feb 12, 2015, 3:26 PM Haynes Bast, SPT 02/12/15 3:26 PM

## 2015-02-06 NOTE — H&P (View-Only) (Signed)
   Daily Progress Note  Assessment/Planning: Known L CFV stenosis mgmt with stenting   Reviewed the L thigh shuntogram.  Will normally have venoplastied the likely venous anastomosis  Unclear if there is some in-stent restenosis on some images, so would likely venoplasty that disease also  Subjective   No complaints  Objective Filed Vitals:   02/04/15 1056 02/04/15 1058 02/04/15 1100 02/04/15 1237  BP: 116/45 131/52 138/55 148/56  Pulse: 62 67 68 69  Temp:      TempSrc:      Resp:    18  Height:      Weight:      SpO2:    100%    Intake/Output Summary (Last 24 hours) at 02/04/15 1500 Last data filed at 02/04/15 1006  Gross per 24 hour  Intake    240 ml  Output      1 ml  Net    239 ml   GEN   Somewhat confused VASC  Moderate size PSA in L thigh AVG, +bruit, weak thrill  Laboratory CBC    Component Value Date/Time   WBC 4.3 02/04/2015 0623   HGB 9.5* 02/04/2015 0623   HCT 30.1* 02/04/2015 0623   PLT 106* 02/04/2015 0623    BMET    Component Value Date/Time   NA 138 02/02/2015 0510   K 3.9 02/02/2015 0510   CL 97* 02/02/2015 0510   CO2 32 02/02/2015 0510   GLUCOSE 130* 02/02/2015 0510   BUN 32* 02/02/2015 0510   CREATININE 8.02* 02/02/2015 0510   CALCIUM 9.4 02/02/2015 0510   GFRNONAA 5* 02/02/2015 0510   GFRAA 5* 02/02/2015 0510    Adele Barthel, MD [facility] [place]: [phone] Pager: [phone]  02/04/2015, 3:00 PM   Addendum  Will schedule patient for L thigh shuntogram with venoplasty of venous anastomosis and iliofemoral stent this coming Wednesday with Dr. Oneida Alar.  Adele Barthel, MD [facility] [place] Office: [phone] Pager: [phone]  02/04/2015, 3:55 PM

## 2015-02-06 NOTE — Progress Notes (Signed)
Rehab Admissions Coordinator Note:  Patient was screened by Cleatrice Burke for appropriateness for an Inpatient Acute Rehab Consult per PT recommendation.   At this time, we are recommending an inpt rehab consult. Please place order.  Cleatrice Burke 02/06/2015, 10:48 PM  I can be reached at [phone].

## 2015-02-06 NOTE — Op Note (Signed)
Procedure: Left thigh shuntogram with angioplasty of venous outflow stenosis  Preoperative diagnosis: Left leg swelling  Postoperative diagnosis: Same  Anesthesia: Local  Operative findings: 60% in-stent restenosis left venous outflow at the level of the femoral head #2 patient has multiple stents extending from the common femoral vein all the way up to the origin of the left common iliac vein which are all widely patent  Operative details: After pain informed consent, the patient was taken to the [place] lab. The patient was placed supine position the Angio table. The left thigh graft was prepped and draped in usual sterile fashion. Local anesthesia was infiltrated over the medial limb the graft. Ultrasound was used to identify the graft. Micropuncture needle was used to cannulate the graft under ultrasound guidance. Neck which were started up into the graft and a micropuncture sheath placed over this. Contrast angiogram was then performed. The venous limb of the graft is patent. There is in-stent restenosis approaching 60% in a stent adjacent to the femoral head in the common femoral vein. There are multiple stents extending through the external iliac vein all the way up into the common iliac vein almost to the caval bifurcation. These are all widely patent. At this point the micropuncture sheath was exchanged over a guidewire for a 6 French sheath. An 8 x 4 mm Angio plasty balloon was brought up in the operative field and inflated to 10 atm for 1 minute. Completion angiogram showed no residual stenosis. The guidewire was removed. A single Monocryl stitch was placed around the sheath and the sheath was removed. The stitch was sutured down to achieve hemostasis. The patient tolerated procedure well and there were no complications.  Operative management: The patient's left thigh graft is ready for use. The graft would be amenable to further percutaneous intervention necessary. Most likely the patient will not  be a candidate for any venous operative revision and will only be a candidate for percutaneous procedures due to the fact that she has stents extending from the common femoral vein all the way up to the cava.  Ruta Hinds, MD [facility] [place] Office: [phone] Pager: [phone]

## 2015-02-06 NOTE — Interval H&P Note (Signed)
History and Physical Interval Note:  02/06/2015 11:38 AM  Stefanie [name]  has presented today for surgery, with the diagnosis of left lower extremity swelling  The various methods of treatment have been discussed with the patient and family. After consideration of risks, benefits and other options for treatment, the patient has consented to  Procedure(s): A/V Shuntogram (N/A) as a surgical intervention .  The patient's history has been reviewed, patient examined, no change in status, stable for surgery.  I have reviewed the patient's chart and labs.  Questions were answered to the patient's satisfaction.     Ruta Hinds

## 2015-02-06 NOTE — Evaluation (Signed)
Occupational Therapy Evaluation Patient Details Name: Stefanie [name] MRN: [id number] DOB: 1945/09/26 Today's Date: 02/06/2015    History of Present Illness 69 yo female admitted with DVT, s/p L heart cath pending procedure 02/06/15 with Dr fields. PMH: HTN, ESRD, DMII, SOB PAD,    Clinical Impression   PT admitted with DVT and pending cardiac workup. Pt currently with functional limitiations due to the deficits listed below (see OT problem list). PTA living at home with [phone] and aide 7 days a week. Pt will benefit from skilled OT to increase their independence and safety with adls and balance to allow discharge SNF.     Follow Up Recommendations  SNF    Equipment Recommendations  None recommended by OT    Recommendations for Other Services       Precautions / Restrictions Precautions Precautions: Fall      Mobility Bed Mobility               General bed mobility comments: in chair on arrival  Transfers Overall transfer level: Needs assistance Equipment used: 4-wheeled walker Transfers: Sit to/from Stand Sit to Stand: Min assist         General transfer comment: cues for hand placement    Balance Overall balance assessment: Needs assistance         Standing balance support: Bilateral upper extremity supported;During functional activity Standing balance-Leahy Scale: Fair                              ADL Overall ADL's : Needs assistance/impaired Eating/Feeding: NPO   Grooming: Wash/dry face;Supervision/safety               Lower Body Dressing: Moderate assistance;Sit to/from stand Lower Body Dressing Details (indicate cue type and reason): pt able to doff socks but difficulty applying socks Toilet Transfer: Maximal assistance;RW;Ambulation Toilet Transfer Details (indicate cue type and reason): cues for hand placement and heavy use of grab bar. Pt unable to complete sit<>stand from low surface Toileting- Clothing Manipulation  and Hygiene: Maximal assistance       Functional mobility during ADLs: Moderate assistance General ADL Comments: Pt requires (A) with sit<>Stand and use of 4ww. pt currently with 4ww brakes lose and not locking.  Pt with DOE and requires rest break between transfers. Pt with HR 80s with movement     Vision     Perception     Praxis      Pertinent Vitals/Pain Pain Assessment: No/denies pain     Hand Dominance Right   Extremity/Trunk Assessment Upper Extremity Assessment Upper Extremity Assessment: RUE deficits/detail;LUE deficits/detail RUE Deficits / Details: 110 degrees LUE Deficits / Details: 100 degrees   Lower Extremity Assessment Lower Extremity Assessment: Defer to PT evaluation   Cervical / Trunk Assessment Cervical / Trunk Assessment: Kyphotic   Communication Communication Communication: No difficulties   Cognition Arousal/Alertness: Awake/alert Behavior During Therapy: WFL for tasks assessed/performed Overall Cognitive Status: Within Functional Limits for tasks assessed                     General Comments       Exercises       Shoulder Instructions      Home Living Family/patient expects to be discharged to:: Private residence Living Arrangements: Alone Available Help at Discharge: Personal care attendant (M-Sunday 2.5 every day except monday 3 hours) Type of Home: Apartment Home Access: Elevator     Home Layout:  One level     Bathroom Shower/Tub: Occupational psychologist: Standard (3n1 over toilet)     Home Equipment: Walker - 4 wheels;Shower seat   Additional Comments: pt has an aide 7 days a week for 2.5 hours except monday she has 3 hours (A). Pt otherwise alone per patient      Prior Functioning/Environment Level of Independence: Independent with assistive device(s)             OT Diagnosis: Generalized weakness;Acute pain   OT Problem List: Decreased strength;Decreased activity tolerance;Impaired balance  (sitting and/or standing);Decreased safety awareness;Decreased knowledge of use of DME or AE;Decreased knowledge of precautions;Pain   OT Treatment/Interventions: Self-care/ADL training;Therapeutic exercise;DME and/or AE instruction;Energy conservation;Cognitive remediation/compensation;Patient/family education;Therapeutic activities    OT Goals(Current goals can be found in the care plan section) Acute Rehab OT Goals Patient Stated Goal: none stated  OT Goal Formulation: With patient Time For Goal Achievement: 02/20/15 Potential to Achieve Goals: Good  OT Frequency: Min 2X/week   Barriers to D/C:            Co-evaluation              End of Session Equipment Utilized During Treatment: Gait belt;Rolling walker Nurse Communication: Mobility status;Precautions  Activity Tolerance: Patient tolerated treatment well Patient left: in chair;with call bell/phone within reach   Time: 0850-0910 OT Time Calculation (min): 20 min Charges:  OT General Charges $OT Visit: 1 Procedure OT Evaluation $Initial OT Evaluation Tier I: 1 Procedure G-Codes:    Parke Poisson B Feb 07, 2015, 9:27 AM   Jeri Modena   OTR/L PagerIP:[phone] Office: [phone] .

## 2015-02-06 NOTE — Progress Notes (Signed)
UR COMPLETED  

## 2015-02-06 NOTE — Progress Notes (Addendum)
Subjective: Some SOB during the night which resolved with a breathing treatment.    Objective: Vital signs in last 24 hours: Temp:  [97.5 F (36.4 C)-99.5 F (37.5 C)] 98.1 F (36.7 C) (11/23 0817) Pulse Rate:  [0-70] 70 (11/23 0817) Resp:  [15-41] 20 (11/23 0817) BP: (110-167)/(40-105) 167/60 mmHg (11/23 0817) SpO2:  [92 %-100 %] 97 % (11/23 0817) Weight:  [164 lb 10.9 oz (74.7 kg)-167 lb 8.8 oz (76 kg)] 167 lb 8.8 oz (76 kg) (11/23 0408) Last BM Date: 02/05/15  Intake/Output from previous day: 11/22 0701 - 11/23 0700 In: 0  Out: 2495  Intake/Output this shift:    Medications Scheduled Meds: . calcitRIOL  1.5 mcg Oral Q T,Th,Sa-HD  . calcium acetate  667 mg Oral TID WC  . carvedilol  3.125 mg Oral BID WC  .  ceFAZolin (ANCEF) IV  2 g Intravenous to SS-Proc  . cinacalcet  90 mg Oral Q breakfast  . clopidogrel  75 mg Oral Daily  . darbepoetin (ARANESP) injection - DIALYSIS  60 mcg Intravenous Q Tue-HD  . docusate sodium  50 mg Oral BID  . gabapentin  300 mg Oral QHS  . insulin aspart  0-9 Units Subcutaneous TID WC  . ketoconazole  1 application Topical Daily  . latanoprost  1 drop Left Eye QHS  . lisinopril  20 mg Oral Daily  . multivitamin  1 tablet Oral QHS  . ofloxacin  1 drop Both Eyes QID  . pantoprazole  40 mg Oral Daily  . polyethylene glycol  17 g Oral Daily  . pravastatin  40 mg Oral Daily  . rOPINIRole  0.5 mg Oral QHS  . sodium chloride  3 mL Intravenous Q12H   Continuous Infusions:  PRN Meds:.sodium chloride, sodium chloride, acetaminophen, albuterol, celecoxib, diclofenac sodium, guaiFENesin, heparin, lidocaine (PF), lidocaine-prilocaine, morphine injection, nitroGLYCERIN, ondansetron **OR** ondansetron (ZOFRAN) IV, pentafluoroprop-tetrafluoroeth, traMADol  PE: General appearance: alert, cooperative and no distress Neck: no JVD Lungs: Decreased BS, mild rhonchi Heart: regular rate and rhythm, S1, S2 normal, no murmur, click, rub or  gallop Extremities: 1+ left LEE.   Pulses: 2+ radials.  DPs. Skin: Warm and dry.  Right groin cath sight: nontender, no hematoma.   Actinic keratosis on left shin.  Neurologic: Grossly normal  Lab Results:   Recent Labs  02/04/15 0623 02/05/15 0342 02/06/15 0453  WBC 4.3 3.3* 3.6*  HGB 9.5* 9.0* 9.2*  HCT 30.1* 28.5* 28.5*  PLT 106* 130* 125*   BMET  Recent Labs  02/05/15 0342 02/06/15 0453  NA 135 137  K 4.7 4.5  CL 97* 99*  CO2 29 29  GLUCOSE 222* 201*  BUN 40* 24*  CREATININE 8.91* 5.93*  CALCIUM 9.3 9.3   PT/INR  Recent Labs  02/05/15 0342  LABPROT 14.5  INR 1.11   Diagnostic Diagram         Lipid Panel     Component Value Date/Time   CHOL 119 02/02/2015 0510   TRIG 43 02/02/2015 0510   HDL 68 02/02/2015 0510   CHOLHDL 1.8 02/02/2015 0510   VLDL 9 02/02/2015 0510   LDLCALC 42 02/02/2015 0510      Assessment/Plan    SP High Risk NST SP left heart cath revealing 1st Diag lesion, 100% stenosed.  Mid RCA lesion, 100% stenosed. faint left to right collaterals are present.  There is severe left ventricular systolic dysfunction, which is out of proportion to the degree of coronary artery disease.  Medical Mgt  Coreg 3.125.,  Plavix, statin.  Not on ASA because she will be started on coumadin.  Right groin cath site stable.     Combined isch and nonischemic cardiomyopathy  EF 30-35%   Lisinopril 20. Euvolemic on exam.   DVT (deep venous thrombosis) (HCC)  Per [facility]   HLD (hyperlipidemia)  On statin.  LDL 42.    ANEMIA   Essential hypertension   Labile. 110/82-154/61.     Cerebral artery occlusion with cerebral infarction ([place])   ESRD on dialysis [facility])   Secondary renal hyperparathyroidism ([place])   PAD (peripheral artery disease) (HCC)   Type 2 diabetes, controlled, with neuropathy (HCC)   Leukocytosis   Elevated troponin   SOB (shortness of breath)   ESRD (end stage renal disease) ([place])     LOS: 5 days    HAGER, BRYAN  PA-C 02/06/2015 8:28 AM  Patient seen, examined. Available data reviewed. Agree with findings, assessment, and plan as outlined by Tarri Fuller PA-C. The patient was independently interviewed and examined. I agree with the exam findings as outlined. The right groin is soft with no ecchymoses or hematoma. There is no peripheral edema. Medical therapy planned for CAD and mixed ischemic and nonischemic cardiomyopathy. She has stabilized with respect to her acute on chronic systolic CHF. Her LV dysfunction is out of proportion to the extent of CAD. She is treated with carvedilol and lisinopril. Hemodialysis for volume management. Her medications are reviewed and warfarin is being initiated for DVT. If plans are to continue long-term warfarin, recommend assessment of continued need for clopidogrel. The patient has a history of "cerebral artery occlusion" and with oral anticoagulation she may not require continued antiplatelet therapy. Defer to the hospitalist team for this decision. From a cardiac perspective she appears stable. I would continue her current cardiac medicines. Will sign off. Please call if any questions arise. Will arrange outpatient follow-up.  Sherren Mocha, M.D. 02/06/2015 10:04 AM

## 2015-02-07 LAB — CBC
HEMATOCRIT: 27.7 % — AB (ref 36.0–46.0)
HEMOGLOBIN: 8.8 g/dL — AB (ref 12.0–15.0)
MCH: 31.7 pg (ref 26.0–34.0)
MCHC: 31.8 g/dL (ref 30.0–36.0)
MCV: 99.6 fL (ref 78.0–100.0)
PLATELETS: 136 10*3/uL — AB (ref 150–400)
RBC: 2.78 MIL/uL — AB (ref 3.87–5.11)
RDW: 15.3 % (ref 11.5–15.5)
WBC: 4.8 10*3/uL (ref 4.0–10.5)

## 2015-02-07 LAB — GLUCOSE, CAPILLARY
Glucose-Capillary: 164 mg/dL — ABNORMAL HIGH (ref 65–99)
Glucose-Capillary: 182 mg/dL — ABNORMAL HIGH (ref 65–99)
Glucose-Capillary: 252 mg/dL — ABNORMAL HIGH (ref 65–99)
Glucose-Capillary: 182 mg/dL — ABNORMAL HIGH (ref 65–99)

## 2015-02-07 LAB — HEPARIN LEVEL (UNFRACTIONATED): HEPARIN UNFRACTIONATED: 0.28 [IU]/mL — AB (ref 0.30–0.70)

## 2015-02-07 MED ORDER — HEPARIN (PORCINE) IN NACL 100-0.45 UNIT/ML-% IJ SOLN
1200.0000 [IU]/h | INTRAMUSCULAR | Status: DC
  Administered 2015-02-07: 09:00:00 1150 [IU]/h via INTRAVENOUS
  Administered 2015-02-08: 1200 [IU]/h via INTRAVENOUS
  Filled 2015-02-07 (×2): qty 250

## 2015-02-07 MED ORDER — WARFARIN SODIUM 5 MG PO TABS
5.0000 mg | ORAL_TABLET | Freq: Once | ORAL | Status: AC
  Administered 2015-02-07: 5 mg via ORAL
  Filled 2015-02-07: qty 1

## 2015-02-07 MED ORDER — INSULIN ASPART 100 UNIT/ML ~~LOC~~ SOLN
2.0000 [IU] | Freq: Once | SUBCUTANEOUS | Status: AC
  Administered 2015-02-07: 2 [IU] via SUBCUTANEOUS

## 2015-02-07 MED ORDER — WARFARIN - PHARMACIST DOSING INPATIENT: Freq: Every day | Status: DC

## 2015-02-07 NOTE — Progress Notes (Signed)
Pt wishes MD to know that she would like to have her sliding scale insulin coverage increased as she feels that it is not adequate at the time to cover her. Pt counseled on correct diet choices and advised that the message would be passed on.

## 2015-02-07 NOTE — Progress Notes (Signed)
ANTICOAGULATION CONSULT NOTE - Follow Up Consult  Pharmacy Consult for heparin and Coumadin Indication: DVT  No Known Allergies  Patient Measurements: Height: 5\' 4"  (162.6 cm) Weight: 168 lb 6.9 oz (76.4 kg) IBW/kg (Calculated) : 54.7 Heparin Dosing Weight: 70 kg  Vital Signs: Temp: 97.9 F (36.6 C) (11/24 1338) Temp Source: Oral (11/24 1338) BP: 107/52 mmHg (11/24 1338) Pulse Rate: 63 (11/24 1338)  Labs:  Recent Labs  02/04/15 2052  02/05/15 0342 02/06/15 0453 02/07/15 0351 02/07/15 1555  HGB  --   < > 9.0* 9.2* 8.8*  --   HCT  --   --  28.5* 28.5* 27.7*  --   PLT  --   --  130* 125* 136*  --   LABPROT  --   --  14.5  --   --   --   INR  --   --  1.11  --   --   --   HEPARINUNFRC 0.14*  --  0.33  --   --  0.28*  CREATININE  --   --  8.91* 5.93*  --   --   < > = values in this interval not displayed.  Estimated Creatinine Clearance: 9 mL/min (by C-G formula based on Cr of 5.93).  Assessment: 69 year old female admitted 02/01/2015 from [facility] found to have common femoral vein DVT.  Patient to resume heparin and start Coumadin 02/07/2015 after having left thigh shuntogram w/ angioplasty 02/06/2015.  PM:  Heparin level 0.28 - just below goal will increase slightly  Goal of Therapy:  INR 2-3 Heparin level 0.3-0.7 units/ml Monitor platelets by anticoagulation protocol: Yes   Plan:  Increase heparin to 12000 units/hr Check AM heparin level Per renal, stop Plavix when INR therapeutic Daily heparin level, INR, CBC Monitor for s/sx of bleeding  Rober Minion, PharmD., MS Clinical Pharmacist Pager:  [phone] Thank you for allowing pharmacy to be part of this patients care team. 02/07/2015 4:47 PM

## 2015-02-07 NOTE — Progress Notes (Signed)
[facility]                                                                              Patient Demographics  Stefanie [name], is a 69 y.o. female, DOB - 1945-04-05, [id number]  Admit date - 02/01/2015   Admitting Physician Ivor Costa, MD  Outpatient Primary MD for the patient is [name], [name], MD  LOS - 6   No chief complaint on file.      Brief HPI  Patient is a 68 year old female with ESRD on HD, TTS, CVA, CHF, CAD, hypertension, hyperlipidemia, diabetes presented with left leg pain and shortness of breath. Patient was transferred from [facility] due to lack of dialysis center. In [facility], patient was found to have left common femoral vein DVT, CT angiogram of the chest was negative for PE. Patient was admitted. Nephrology and vascular surgery were consulted.    Assessment & Plan    Principal Problem:  1-Left femoral vein DVT:  - Patient was placed on heparin drip, vascular surgery was consulted. - per VVS and following normal fistulogram, no needs for thrombolysis  - Patient underwent left thigh shuntogram with angioplasty of the venous outflow stenosis today - Reviewed nephrology and cardiology recommendations, patient to start on Coumadin, plan to stop Plavix once she is therapeutic on Coumadin.  - Discussed with vascular surgery, Dr. Oneida Alar 11/23, recommended to start Coumadin today, will place on Coumadin per pharmacy  2-ESRD: renal on board - will continue HD T-T-S, nephrology following - Per vascular surgery, Dr Oneida Alar,  patient's left thigh graft is ready to use and would be unable to further percutaneous intervention necessary. Most likely the patient will not be a candidate for any venous operative revision and will only be a candidate for percutaneous procedures due to the fact that she has stents extending from the common femoral vein all the way up to the cava.  3-elevated troponin and DOE: cardiology consulted -2-D echo  showed severe global hypokinesis and reduce EF -will continue lisinopril and b-blocker -Myoview abnormal and high risk; patient underwent cardiac cath on 11/22 which showed left ventricular EF less than 25%, global hypokinesis of the left ventricle, mid RCA lesion 100% stenosis, first diagonal lesion 100 % stenosis  4-anemia of chronic disease:  -ESA and IV iron as per renal discretion -Hgb stable  5-HLD: continue statins  6-Hx of cerebral artery occlusion with cerebral infarction [facility]): no acute new issues. History of stroke in 1985 and 1990 -on Plavix  7-DM-II: Last A1c not on record, Patient is taking 75/25 mix insulin at home -continue SSI while inpatient   8-acute on chronic L ventricular dysfunction -EF 30-35%, volume management with the hemodialysis, continue ACE inhibitor, beta blocker Cardiology following  Code Status: Full code  Family Communication: Discussed in detail with the patient, all imaging results, lab results explained to the patient    Disposition Plan: Awaiting inpatient rehabilitation once bed available  Time Spent in minutes  15 minutes  Procedures  Echo EF 30-35% with grade 2 diastolic dysfunction   stress Myoview high-risk 11/21 Fistulogram 11/21 Cardiac cath 11/22 Left thigh shuntogram with angioplasty  on 11/23  Consults   Nephrology   cardiology Vascular surgery  DVT Prophylaxis  heparin   Medications  Scheduled Meds: . calcitRIOL  1.5 mcg Oral Q T,Th,Sa-HD  . calcium acetate  667 mg Oral TID WC  . carvedilol  3.125 mg Oral BID WC  . cinacalcet  90 mg Oral Q breakfast  . clopidogrel  75 mg Oral Daily  . darbepoetin (ARANESP) injection - DIALYSIS  60 mcg Intravenous Q Tue-HD  . docusate sodium  50 mg Oral BID  . gabapentin  300 mg Oral QHS  . insulin aspart  0-9 Units Subcutaneous TID WC  . ketoconazole  1 application Topical Daily  . latanoprost  1 drop Left Eye QHS  . lisinopril  20 mg Oral Daily  . multivitamin  1 tablet Oral  QHS  . ofloxacin  1 drop Both Eyes QID  . pantoprazole  40 mg Oral Daily  . polyethylene glycol  17 g Oral Daily  . pravastatin  40 mg Oral Daily  . rOPINIRole  0.5 mg Oral QHS  . sodium chloride  3 mL Intravenous Q12H  . warfarin  5 mg Oral ONCE-1800  . Warfarin - Pharmacist Dosing Inpatient   Does not apply q1800   Continuous Infusions: . heparin 1,150 Units/hr (02/07/15 0833)   PRN Meds:.sodium chloride, sodium chloride, acetaminophen **OR** acetaminophen, albuterol, celecoxib, diclofenac sodium, guaiFENesin, heparin, hydrALAZINE, labetalol, lidocaine (PF), lidocaine-prilocaine, metoprolol, morphine injection, nitroGLYCERIN, ondansetron, ondansetron **OR** [DISCONTINUED] ondansetron (ZOFRAN) IV, pentafluoroprop-tetrafluoroeth, traMADol   Antibiotics   Anti-infectives    Start     Dose/Rate Route Frequency Ordered Stop   02/06/15 0800  ceFAZolin (ANCEF) IVPB 2 g/50 mL premix     2 g 100 mL/hr over 30 Minutes Intravenous To ShortStay Procedural 02/04/15 1630 02/07/15 0800        Subjective:   Stefanie [name] was seen and examined today.  Sitting up in the chair, no complaints per the patient. However very weak and needs 2 person assist. Denies dizziness, chest pain, shortness of breath, abdominal pain, N/V/D/C, new weakness, numbess, tingling. No acute events overnight.    Objective:   Blood pressure 159/56, pulse 65, temperature 97.7 F (36.5 C), temperature source Oral, resp. rate 15, height 5\' 4"  (1.626 m), weight 76.4 kg (168 lb 6.9 oz), SpO2 100 %.  Wt Readings from Last 3 Encounters:  02/07/15 76.4 kg (168 lb 6.9 oz)  03/23/14 88.451 kg (195 lb)  03/01/14 88.451 kg (195 lb)     Intake/Output Summary (Last 24 hours) at 02/07/15 1204 Last data filed at 02/07/15 0749  Gross per 24 hour  Intake    600 ml  Output      0 ml  Net    600 ml    Exam  General: Alert and oriented x 3, NAD  HEENT:  PERRLA, EOMI, Anicteric Sclera, mucous membranes moist.   Neck:  Supple, no JVD, no masses  CVS: S1 S2 clear, RRR  Respiratory: CTAB  Abdomen: Soft, nontender, nondistended, + bowel sounds  Ext: no cyanosis clubbing,1 + edema  Neuro: no new deficits   Skin: No rashes  Psych: Normal affect and demeanor, alert and oriented x3    Data Review   Micro Results No results found for this or any previous visit (from the past 240 hour(s)).  Radiology Reports Dg Chest 2 View  02/02/2015  CLINICAL DATA:  Shortness of breath, cough, and weakness for 2 days. EXAM: CHEST  2 VIEW COMPARISON:  02/01/2015 FINDINGS:  Moderate cardiomegaly and diffuse pulmonary vascular congestion remains stable. No evidence of acute infiltrate or pleural effusion. Short segment of wire again seen overlying the right clavicle. Endovascular stent seen in left axillary region. IMPRESSION: Stable cardiomegaly and pulmonary vascular congestion. No acute findings. Electronically Signed   By: Earle Gell M.D.   On: 02/02/2015 00:49   Nm Myocar Multi W/spect W/wall Motion / Ef  02/04/2015   There was no ST segment deviation noted during stress.  Defect 1: There is a medium defect of mild severity present in the basal inferior, mid anterior, mid inferior and apex location.  Findings consistent with ischemia.  This is a high risk study.  The left ventricular ejection fraction is severely decreased (<30%).  Nuclear stress EF: 27%.    Ir Shuntogram/ Fistulagram Left Mod Sed  02/04/2015  INDICATION: New left femoral vein DVT with functional left AV thigh graft. Please evaluate for thrombus affecting the thigh graft. EXAM: DIALYSIS AV SHUNTOGRAM/FISTULAGRAM COMPARISON:  Declot left thigh AV graft - 04/14/2013 MEDICATIONS: None. CONTRAST:  35mL OMNIPAQUE IOHEXOL 300 MG/ML  SOLN ANESTHESIA/SEDATION: None FLUOROSCOPY TIME:  24 seconds ([id number] mGy) COMPLICATIONS: None immediate PROCEDURE: The left arm dialysis graft was prepped with Betadine in a sterile fashion, and a sterile drape was applied  covering the operative field. A diagnostic shunt study was performed via an 18 gauge angiocatheter introduced into venous outflow. Venous drainage was assessed to the level of the central veins in the chest. Proximal shunt was studied by reflux maneuver with temporary compression of venous outflow. The angiocath was removed and hemostasis was achieved with manual compression. A dressing was placed. The patient tolerated the procedure well without immediate post procedural complication. FINDINGS: The left thigh AV graft is widely patent. There is mild pseudo aneurysmal dilatation involving the venous stick zone of the graft without evidence of mural thrombosis. The remainder the venous limb is widely patent. There is a very mild (under 30%) narrowing involving the central most aspect of the venous limb regional to the anastomosis, not resulting in a hemodynamically significant stenosis. Overlapping stents are seen within the left external iliac vein. Both stents appear widely patent though there is a very minimal amount of InStent stenosis within the more peripheral overlapping venous stent, not resulting in a hemodynamically significant stenosis. The remainder of the venous system of the left lower extremity is widely patent to the level of the inferior aspect of the right atrium. Reflux shuntogram demonstrates wide patency of the arterial limb and arterial anastomosis. There is mild pseudo aneurysmal dilatation within the arterial stick zone of the graft without evidence of mural thrombus. IMPRESSION: Widely patent left thigh graft without evidence of a hemodynamically significant peripheral or central stenosis. No intervention performed. ACCESS: This access remains amenable to future percutaneous interventions as clinically indicated. Electronically Signed   By: Sandi Mariscal M.D.   On: 02/04/2015 13:12    CBC  Recent Labs Lab 02/03/15 0310 02/04/15 0623 02/05/15 0342 02/06/15 0453 02/07/15 0351  WBC 5.8  4.3 3.3* 3.6* 4.8  HGB 9.3* 9.5* 9.0* 9.2* 8.8*  HCT 29.3* 30.1* 28.5* 28.5* 27.7*  PLT 102* 106* 130* 125* 136*  MCV 98.3 99.3 99.0 100.0 99.6  MCH 31.2 31.4 31.3 32.3 31.7  MCHC 31.7 31.6 31.6 32.3 31.8  RDW 15.6* 15.3 15.2 15.2 15.3    Chemistries   Recent Labs Lab 02/02/15 0510 02/05/15 0342 02/06/15 0453  NA 138 135 137  K 3.9 4.7 4.5  CL 97* 97*  99*  CO2 32 29 29  GLUCOSE 130* 222* 201*  BUN 32* 40* 24*  CREATININE 8.02* 8.91* 5.93*  CALCIUM 9.4 9.3 9.3   ------------------------------------------------------------------------------------------------------------------ estimated creatinine clearance is 9 mL/min (by C-G formula based on Cr of 5.93). ------------------------------------------------------------------------------------------------------------------ No results for input(s): HGBA1C in the last 72 hours. ------------------------------------------------------------------------------------------------------------------ No results for input(s): CHOL, HDL, LDLCALC, TRIG, CHOLHDL, LDLDIRECT in the last 72 hours. ------------------------------------------------------------------------------------------------------------------ No results for input(s): TSH, T4TOTAL, T3FREE, THYROIDAB in the last 72 hours.  Invalid input(s): FREET3 ------------------------------------------------------------------------------------------------------------------ No results for input(s): VITAMINB12, FOLATE, FERRITIN, TIBC, IRON, RETICCTPCT in the last 72 hours.  Coagulation profile  Recent Labs Lab 02/02/15 0510 02/05/15 0342  INR 1.26 1.11    No results for input(s): DDIMER in the last 72 hours.  Cardiac Enzymes  Recent Labs Lab 02/02/15 02/02/15 0510 02/02/15 1113  TROPONINI 0.13* 0.11* 0.16*   ------------------------------------------------------------------------------------------------------------------ Invalid input(s): POCBNP   Recent Labs  02/06/15 0859  02/06/15 1253 02/06/15 1720 02/06/15 2111 02/07/15 0637 02/07/15 1156  GLUCAP 218* 130* 253* 161* 164* 182*     Lamine Laton M.D. [facility] 02/07/2015, 12:04 PM  Pager: [phone] Between 7am to 7pm - call Pager - [phone]  After 7pm go to [email or web address] - password [email or web address]  Call night coverage person covering after 7pm

## 2015-02-07 NOTE — Progress Notes (Signed)
ANTICOAGULATION CONSULT NOTE - Follow Up Consult  Pharmacy Consult for heparin and Coumadin Indication: DVT  No Known Allergies  Patient Measurements: Height: 5\' 4"  (162.6 cm) Weight: 168 lb 6.9 oz (76.4 kg) IBW/kg (Calculated) : 54.7 Heparin Dosing Weight: 70 kg  Vital Signs: Temp: 97.5 F (36.4 C) (11/24 0749) Temp Source: Oral (11/24 0749) BP: 161/58 mmHg (11/24 0749) Pulse Rate: 65 (11/24 0749)  Labs:  Recent Labs  02/04/15 2052  02/05/15 0342 02/06/15 0453 02/07/15 0351  HGB  --   < > 9.0* 9.2* 8.8*  HCT  --   --  28.5* 28.5* 27.7*  PLT  --   --  130* 125* 136*  LABPROT  --   --  14.5  --   --   INR  --   --  1.11  --   --   HEPARINUNFRC 0.14*  --  0.33  --   --   CREATININE  --   --  8.91* 5.93*  --   < > = values in this interval not displayed.  Estimated Creatinine Clearance: 9 mL/min (by C-G formula based on Cr of 5.93).  Assessment: 69 year old female admitted 02/01/2015 from [facility] found to have common femoral vein DVT.  Patient to resume heparin and start Coumadin 02/07/2015 after having left thigh shuntogram w/ angioplasty 02/06/2015.  Hospital course complicated by high risk Myoview now s/p cardiac cath.  Goal of Therapy:  INR 2-3 Heparin level 0.3-0.7 units/ml Monitor platelets by anticoagulation protocol: Yes   Plan:  Restart heparin at 1150 units/hr Coumadin 5mg  x1 Check 8hr heparin level Per renal, stop Plavix when INR therapeutic Daily heparin level, INR, CBC Monitor for s/sx of bleeding   Drucie Opitz, PharmD Clinical Pharmacist Pager: [phone] 02/07/2015 8:08 AM

## 2015-02-07 NOTE — Progress Notes (Signed)
   02/07/15 1800  Clinical Encounter Type  Visited With Patient  Visit Type Initial;Spiritual support;Social support  Referral From Physician  Spiritual Encounters  Spiritual Needs Emotional;Prayer  Ch referred to visit with pt for major life transition; pt doing much better according to her opinion; pt speech difficult to understand; [place] offered spiritual support and prayer.  Gwynn Burly 6:52 PM

## 2015-02-07 NOTE — Progress Notes (Signed)
Patient ID: Stefanie [name], female   DOB: 1946-02-21, 69 y.o.   MRN: [id number]  [facility] Progress Note    Subjective:   No new complaints   Objective:   BP 161/58 mmHg  Pulse 65  Temp(Src) 97.5 F (36.4 C) (Oral)  Resp 20  Ht 5\' 4"  (1.626 m)  Wt 76.4 kg (168 lb 6.9 oz)  BMI 28.90 kg/m2  SpO2 100%  Intake/Output: I/O last 3 completed shifts: In: 360 [P.O.:360] Out: 0    Intake/Output this shift:  Total I/O In: 240 [P.O.:240] Out: -  Weight change: -1.1 kg (-2 lb 6.8 oz)  Physical Exam: Gen:WD WN obese AAF in NAD CVS:no rub Resp:cta [id number] Ext:no edema, L thigh AVG +T/B  Labs: BMET  Recent Labs Lab 02/02/15 0510 02/05/15 0342 02/06/15 0453  NA 138 135 137  K 3.9 4.7 4.5  CL 97* 97* 99*  CO2 32 29 29  GLUCOSE 130* 222* 201*  BUN 32* 40* 24*  CREATININE 8.02* 8.91* 5.93*  CALCIUM 9.4 9.3 9.3   CBC  Recent Labs Lab 02/04/15 0623 02/05/15 0342 02/06/15 0453 02/07/15 0351  WBC 4.3 3.3* 3.6* 4.8  HGB 9.5* 9.0* 9.2* 8.8*  HCT 30.1* 28.5* 28.5* 27.7*  MCV 99.3 99.0 100.0 99.6  PLT 106* 130* 125* 136*    @IMGRELPRIORS @ Medications:    . calcitRIOL  1.5 mcg Oral Q T,Th,Sa-HD  . calcium acetate  667 mg Oral TID WC  . carvedilol  3.125 mg Oral BID WC  . cinacalcet  90 mg Oral Q breakfast  . clopidogrel  75 mg Oral Daily  . darbepoetin (ARANESP) injection - DIALYSIS  60 mcg Intravenous Q Tue-HD  . docusate sodium  50 mg Oral BID  . gabapentin  300 mg Oral QHS  . insulin aspart  0-9 Units Subcutaneous TID WC  . ketoconazole  1 application Topical Daily  . latanoprost  1 drop Left Eye QHS  . lisinopril  20 mg Oral Daily  . multivitamin  1 tablet Oral QHS  . ofloxacin  1 drop Both Eyes QID  . pantoprazole  40 mg Oral Daily  . polyethylene glycol  17 g Oral Daily  . pravastatin  40 mg Oral Daily  . rOPINIRole  0.5 mg Oral QHS  . sodium chloride  3 mL Intravenous Q12H  . warfarin  5 mg Oral ONCE-1800  . Warfarin -  Pharmacist Dosing Inpatient   Does not apply q1800   Dialysis Orders: [facility] T[facility] 3.5 hr 2 K 2 Ca profile 4 400/A 1.5 EDW 75 Heparin 4000 Mircera 50 q 2 w ksgiven 11/8 calcitriol 1.5 no Fe Recent: Hgb 10.8 iPTH 690 Ca/P ok  Assessment/ Plan:   1. L femoral vein DVT - on IV heparin. Question of whether DVT is proximal or distal to AVG venous anastamosis. VVS following, thoughts are that clot is distal to AVG. Had normal shuntogram per IR.Continue heparin drip and started on coumadin. Would stop plavix once she is therapeutic on coumadin. 2. ESRD - TTS - [place]. Next HD on 02/08/15. 3. HTN - on lisinopril 20 mg PO Daily. BP controlled.  4. Volume - Last HD 11/20. Net UF 1800. Post wt 80.2 kg. Roughly 5 kg over OP EDW. Question accuracy. No edema. EDW recently lowered in HD center.  5. Anemia - Hgb continues to trend downward.  Transfuse prn, will redose ESA next treatment  6. Metabolic bone disease - Continue calcitriol/binders/sensipar 7. Nutrition - renal carb mod diet/vitamin-  unintentional weight loss - add nepro 8. DM - per primary 9. Thrombocytopenia - Platelets 106 today. Will follow CBC.  10. CAD/Elevated Troponin/Acute on Chronic Systolic Dysfunction: Cardiology following.Troponin elevation flat. Echo done 11/19 shows Moderate to severe global reduction in LV function, grade 2 diastolic dysfunction with elevated LV filling pressure. EF 30-35%. High risk-Stress test which led to cardiac cath 02/05/15 revealing severe LV dysfunction out of proportion to CAD now on coreg. multivessel CAD with chronic lesions, nothing acute. Seen by Cardiology without plans for any further intervention.   Donetta Potts, MD [place] Pager [phone] 02/07/2015, 8:37 AM

## 2015-02-08 ENCOUNTER — Inpatient Hospital Stay (HOSPITAL_COMMUNITY)
Admission: RE | Admit: 2015-02-08 | Discharge: 2015-02-25 | DRG: 947 | Disposition: A | Discharge status: Skilled Nursing Facility | Payer: Medicare Other | Source: Intra-hospital | Attending: Physical Medicine & Rehabilitation | Admitting: Physical Medicine & Rehabilitation

## 2015-02-08 DIAGNOSIS — I6350 Cerebral infarction due to unspecified occlusion or stenosis of unspecified cerebral artery: Secondary | ICD-10-CM

## 2015-02-08 DIAGNOSIS — I82412 Acute embolism and thrombosis of left femoral vein: Secondary | ICD-10-CM | POA: Diagnosis present

## 2015-02-08 DIAGNOSIS — Z992 Dependence on renal dialysis: Secondary | ICD-10-CM

## 2015-02-08 DIAGNOSIS — N186 End stage renal disease: Secondary | ICD-10-CM

## 2015-02-08 DIAGNOSIS — I6930 Unspecified sequelae of cerebral infarction: Secondary | ICD-10-CM | POA: Diagnosis not present

## 2015-02-08 DIAGNOSIS — I69331 Monoplegia of upper limb following cerebral infarction affecting right dominant side: Secondary | ICD-10-CM | POA: Diagnosis not present

## 2015-02-08 DIAGNOSIS — E1121 Type 2 diabetes mellitus with diabetic nephropathy: Secondary | ICD-10-CM | POA: Diagnosis present

## 2015-02-08 DIAGNOSIS — R0602 Shortness of breath: Secondary | ICD-10-CM | POA: Diagnosis not present

## 2015-02-08 DIAGNOSIS — E119 Type 2 diabetes mellitus without complications: Secondary | ICD-10-CM | POA: Diagnosis not present

## 2015-02-08 DIAGNOSIS — I739 Peripheral vascular disease, unspecified: Secondary | ICD-10-CM | POA: Diagnosis present

## 2015-02-08 DIAGNOSIS — I2510 Atherosclerotic heart disease of native coronary artery without angina pectoris: Secondary | ICD-10-CM | POA: Diagnosis present

## 2015-02-08 DIAGNOSIS — I10 Essential (primary) hypertension: Secondary | ICD-10-CM | POA: Diagnosis not present

## 2015-02-08 DIAGNOSIS — I120 Hypertensive chronic kidney disease with stage 5 chronic kidney disease or end stage renal disease: Secondary | ICD-10-CM | POA: Diagnosis present

## 2015-02-08 DIAGNOSIS — R609 Edema, unspecified: Secondary | ICD-10-CM | POA: Diagnosis not present

## 2015-02-08 DIAGNOSIS — R531 Weakness: Secondary | ICD-10-CM | POA: Diagnosis present

## 2015-02-08 DIAGNOSIS — J811 Chronic pulmonary edema: Secondary | ICD-10-CM | POA: Diagnosis not present

## 2015-02-08 DIAGNOSIS — E1122 Type 2 diabetes mellitus with diabetic chronic kidney disease: Secondary | ICD-10-CM | POA: Diagnosis present

## 2015-02-08 DIAGNOSIS — I69959 Hemiplegia and hemiparesis following unspecified cerebrovascular disease affecting unspecified side: Secondary | ICD-10-CM | POA: Diagnosis not present

## 2015-02-08 DIAGNOSIS — D6949 Other primary thrombocytopenia: Secondary | ICD-10-CM | POA: Diagnosis present

## 2015-02-08 DIAGNOSIS — I82409 Acute embolism and thrombosis of unspecified deep veins of unspecified lower extremity: Secondary | ICD-10-CM | POA: Insufficient documentation

## 2015-02-08 DIAGNOSIS — D649 Anemia, unspecified: Secondary | ICD-10-CM | POA: Diagnosis not present

## 2015-02-08 DIAGNOSIS — E1140 Type 2 diabetes mellitus with diabetic neuropathy, unspecified: Secondary | ICD-10-CM | POA: Diagnosis not present

## 2015-02-08 DIAGNOSIS — R079 Chest pain, unspecified: Secondary | ICD-10-CM | POA: Diagnosis not present

## 2015-02-08 DIAGNOSIS — R5381 Other malaise: Secondary | ICD-10-CM | POA: Diagnosis present

## 2015-02-08 DIAGNOSIS — I509 Heart failure, unspecified: Secondary | ICD-10-CM | POA: Diagnosis not present

## 2015-02-08 DIAGNOSIS — D638 Anemia in other chronic diseases classified elsewhere: Secondary | ICD-10-CM | POA: Diagnosis present

## 2015-02-08 DIAGNOSIS — N2581 Secondary hyperparathyroidism of renal origin: Secondary | ICD-10-CM | POA: Diagnosis not present

## 2015-02-08 DIAGNOSIS — R252 Cramp and spasm: Secondary | ICD-10-CM

## 2015-02-08 DIAGNOSIS — D631 Anemia in chronic kidney disease: Secondary | ICD-10-CM | POA: Diagnosis not present

## 2015-02-08 DIAGNOSIS — Z794 Long term (current) use of insulin: Secondary | ICD-10-CM | POA: Diagnosis not present

## 2015-02-08 DIAGNOSIS — I82402 Acute embolism and thrombosis of unspecified deep veins of left lower extremity: Secondary | ICD-10-CM | POA: Diagnosis present

## 2015-02-08 DIAGNOSIS — E1129 Type 2 diabetes mellitus with other diabetic kidney complication: Secondary | ICD-10-CM | POA: Diagnosis not present

## 2015-02-08 DIAGNOSIS — E441 Mild protein-calorie malnutrition: Secondary | ICD-10-CM | POA: Diagnosis not present

## 2015-02-08 DIAGNOSIS — D696 Thrombocytopenia, unspecified: Secondary | ICD-10-CM | POA: Diagnosis not present

## 2015-02-08 DIAGNOSIS — E785 Hyperlipidemia, unspecified: Secondary | ICD-10-CM | POA: Diagnosis not present

## 2015-02-08 DIAGNOSIS — H33003 Unspecified retinal detachment with retinal break, bilateral: Secondary | ICD-10-CM | POA: Diagnosis not present

## 2015-02-08 LAB — HEPARIN LEVEL (UNFRACTIONATED)
Heparin Unfractionated: 0.4 IU/mL (ref 0.30–0.70)
Heparin Unfractionated: 0.64 IU/mL (ref 0.30–0.70)

## 2015-02-08 LAB — RENAL FUNCTION PANEL
ALBUMIN: 2.9 g/dL — AB (ref 3.5–5.0)
Anion gap: 9 (ref 5–15)
BUN: 50 mg/dL — AB (ref 6–20)
CALCIUM: 9.7 mg/dL (ref 8.9–10.3)
CO2: 27 mmol/L (ref 22–32)
CREATININE: 9.45 mg/dL — AB (ref 0.44–1.00)
Chloride: 97 mmol/L — ABNORMAL LOW (ref 101–111)
GFR calc Af Amer: 4 mL/min — ABNORMAL LOW (ref >60)
GFR, EST NON AFRICAN AMERICAN: 4 mL/min — AB (ref >60)
Glucose, Bld: 179 mg/dL — ABNORMAL HIGH (ref 65–99)
PHOSPHORUS: 3.6 mg/dL (ref 2.5–4.6)
POTASSIUM: 4.6 mmol/L (ref 3.5–5.1)
SODIUM: 133 mmol/L — AB (ref 135–145)

## 2015-02-08 LAB — CBC
HEMATOCRIT: 26.8 % — AB (ref 36.0–46.0)
HEMOGLOBIN: 8.5 g/dL — AB (ref 12.0–15.0)
MCH: 31.7 pg (ref 26.0–34.0)
MCHC: 31.7 g/dL (ref 30.0–36.0)
MCV: 100 fL (ref 78.0–100.0)
Platelets: 144 10*3/uL — ABNORMAL LOW (ref 150–400)
RBC: 2.68 MIL/uL — ABNORMAL LOW (ref 3.87–5.11)
RDW: 15.8 % — AB (ref 11.5–15.5)
WBC: 5.2 10*3/uL (ref 4.0–10.5)

## 2015-02-08 LAB — PROTIME-INR
INR: 1.14 (ref 0.00–1.49)
Prothrombin Time: 14.8 seconds (ref 11.6–15.2)

## 2015-02-08 LAB — GLUCOSE, CAPILLARY
GLUCOSE-CAPILLARY: 142 mg/dL — AB (ref 65–99)
GLUCOSE-CAPILLARY: 142 mg/dL — AB (ref 65–99)
Glucose-Capillary: 173 mg/dL — ABNORMAL HIGH (ref 65–99)

## 2015-02-08 MED ORDER — ALUMINUM HYDROXIDE GEL 320 MG/5ML PO SUSP
15.0000 mL | Freq: Four times a day (QID) | ORAL | Status: DC | PRN
  Filled 2015-02-08: qty 30

## 2015-02-08 MED ORDER — ONDANSETRON HCL 4 MG/2ML IJ SOLN: 4.0000 mg | Freq: Four times a day (QID) | INTRAMUSCULAR | Status: DC | PRN

## 2015-02-08 MED ORDER — LISINOPRIL 20 MG PO TABS
20.0000 mg | ORAL_TABLET | Freq: Every day | ORAL | Status: DC
  Administered 2015-02-09 – 2015-02-10 (×2): 20 mg via ORAL
  Filled 2015-02-08 (×2): qty 1

## 2015-02-08 MED ORDER — HEPARIN (PORCINE) IN NACL 100-0.45 UNIT/ML-% IJ SOLN
1200.0000 [IU]/h | INTRAMUSCULAR | Status: DC
  Administered 2015-02-09 – 2015-02-11 (×3): 1200 [IU]/h via INTRAVENOUS
  Filled 2015-02-08 (×7): qty 250

## 2015-02-08 MED ORDER — CALCITRIOL 0.5 MCG PO CAPS
1.5000 ug | ORAL_CAPSULE | ORAL | Status: DC
  Administered 2015-02-09 – 2015-02-23 (×7): 1.5 ug via ORAL
  Filled 2015-02-08 (×7): qty 3

## 2015-02-08 MED ORDER — GABAPENTIN 300 MG PO CAPS
300.0000 mg | ORAL_CAPSULE | Freq: Every day | ORAL | Status: DC
  Administered 2015-02-08 – 2015-02-24 (×17): 300 mg via ORAL
  Filled 2015-02-08 (×17): qty 1

## 2015-02-08 MED ORDER — FLEET ENEMA 7-19 GM/118ML RE ENEM: 1.0000 | ENEMA | Freq: Once | RECTAL | Status: DC | PRN

## 2015-02-08 MED ORDER — INSULIN ASPART 100 UNIT/ML ~~LOC~~ SOLN: 0.0000 [IU] | Freq: Three times a day (TID) | SUBCUTANEOUS | Status: DC

## 2015-02-08 MED ORDER — DICLOFENAC SODIUM 1 % TD GEL
2.0000 g | Freq: Four times a day (QID) | TRANSDERMAL | Status: DC | PRN
  Administered 2015-02-15 – 2015-02-24 (×13): 2 g via TOPICAL
  Filled 2015-02-08: qty 100

## 2015-02-08 MED ORDER — INSULIN ASPART 100 UNIT/ML ~~LOC~~ SOLN
0.0000 [IU] | Freq: Every day | SUBCUTANEOUS | Status: DC
  Administered 2015-02-09: 2 [IU] via SUBCUTANEOUS

## 2015-02-08 MED ORDER — NITROGLYCERIN 0.4 MG SL SUBL: 0.4000 mg | SUBLINGUAL_TABLET | SUBLINGUAL | Status: DC | PRN

## 2015-02-08 MED ORDER — HEPARIN (PORCINE) IN NACL 100-0.45 UNIT/ML-% IJ SOLN
1200.0000 [IU]/h | INTRAMUSCULAR | Status: DC
  Filled 2015-02-08: qty 250

## 2015-02-08 MED ORDER — DOCUSATE SODIUM 100 MG PO CAPS
100.0000 mg | ORAL_CAPSULE | Freq: Two times a day (BID) | ORAL | Status: DC
  Administered 2015-02-08 – 2015-02-14 (×11): 100 mg via ORAL
  Filled 2015-02-08 (×12): qty 1

## 2015-02-08 MED ORDER — PENTAFLUOROPROP-TETRAFLUOROETH EX AERO
1.0000 "application " | INHALATION_SPRAY | CUTANEOUS | Status: DC | PRN
  Administered 2015-02-12: 1 via TOPICAL

## 2015-02-08 MED ORDER — DOCUSATE SODIUM 100 MG PO CAPS
100.0000 mg | ORAL_CAPSULE | Freq: Two times a day (BID) | ORAL | Status: DC
  Administered 2015-02-08: 100 mg via ORAL
  Filled 2015-02-08: qty 1

## 2015-02-08 MED ORDER — KETOCONAZOLE 2 % EX CREA
1.0000 "application " | TOPICAL_CREAM | Freq: Every day | CUTANEOUS | Status: DC
  Administered 2015-02-09 – 2015-02-23 (×14): 1 via TOPICAL
  Filled 2015-02-08 (×2): qty 15

## 2015-02-08 MED ORDER — INSULIN ASPART 100 UNIT/ML ~~LOC~~ SOLN: 0.0000 [IU] | Freq: Every day | SUBCUTANEOUS | Status: DC

## 2015-02-08 MED ORDER — RENA-VITE PO TABS
1.0000 | ORAL_TABLET | Freq: Every day | ORAL | Status: DC
  Administered 2015-02-08 – 2015-02-24 (×17): 1 via ORAL
  Filled 2015-02-08 (×17): qty 1

## 2015-02-08 MED ORDER — HEPARIN SODIUM (PORCINE) 1000 UNIT/ML DIALYSIS
1000.0000 [IU] | INTRAMUSCULAR | Status: DC | PRN
  Filled 2015-02-08: qty 1

## 2015-02-08 MED ORDER — CALCIUM ACETATE (PHOS BINDER) 667 MG PO CAPS
667.0000 mg | ORAL_CAPSULE | Freq: Three times a day (TID) | ORAL | Status: DC
  Administered 2015-02-09 – 2015-02-25 (×50): 667 mg via ORAL
  Filled 2015-02-08 (×52): qty 1

## 2015-02-08 MED ORDER — POLYETHYLENE GLYCOL 3350 17 G PO PACK
17.0000 g | PACK | Freq: Every day | ORAL | Status: DC
  Administered 2015-02-09 – 2015-02-25 (×11): 17 g via ORAL
  Filled 2015-02-08 (×17): qty 1

## 2015-02-08 MED ORDER — PRAVASTATIN SODIUM 20 MG PO TABS
40.0000 mg | ORAL_TABLET | Freq: Every day | ORAL | Status: DC
  Administered 2015-02-09 – 2015-02-24 (×16): 40 mg via ORAL
  Filled 2015-02-08 (×18): qty 2

## 2015-02-08 MED ORDER — CLOPIDOGREL BISULFATE 75 MG PO TABS
75.0000 mg | ORAL_TABLET | Freq: Every day | ORAL | Status: DC
  Administered 2015-02-09 – 2015-02-12 (×4): 75 mg via ORAL
  Filled 2015-02-08 (×4): qty 1

## 2015-02-08 MED ORDER — GUAIFENESIN ER 600 MG PO TB12
600.0000 mg | ORAL_TABLET | Freq: Two times a day (BID) | ORAL | Status: DC | PRN
Start: 2015-02-08 — End: 2015-02-25
  Administered 2015-02-18: 600 mg via ORAL
  Filled 2015-02-08: qty 1

## 2015-02-08 MED ORDER — OFLOXACIN 0.3 % OP SOLN
1.0000 [drp] | Freq: Four times a day (QID) | OPHTHALMIC | Status: DC
  Administered 2015-02-08 – 2015-02-13 (×17): 1 [drp] via OPHTHALMIC
  Filled 2015-02-08: qty 5

## 2015-02-08 MED ORDER — LATANOPROST 0.005 % OP SOLN
1.0000 [drp] | Freq: Every day | OPHTHALMIC | Status: DC
  Administered 2015-02-08 – 2015-02-24 (×17): 1 [drp] via OPHTHALMIC
  Filled 2015-02-08 (×2): qty 2.5

## 2015-02-08 MED ORDER — ROPINIROLE HCL 0.5 MG PO TABS
0.5000 mg | ORAL_TABLET | Freq: Every day | ORAL | Status: DC
  Administered 2015-02-08 – 2015-02-24 (×17): 0.5 mg via ORAL
  Filled 2015-02-08 (×18): qty 1

## 2015-02-08 MED ORDER — INSULIN ASPART 100 UNIT/ML ~~LOC~~ SOLN
0.0000 [IU] | Freq: Three times a day (TID) | SUBCUTANEOUS | Status: DC
  Administered 2015-02-09 (×3): 3 [IU] via SUBCUTANEOUS
  Administered 2015-02-10: 2 [IU] via SUBCUTANEOUS
  Administered 2015-02-10 – 2015-02-11 (×3): 3 [IU] via SUBCUTANEOUS

## 2015-02-08 MED ORDER — ALBUTEROL SULFATE (2.5 MG/3ML) 0.083% IN NEBU
2.5000 mg | INHALATION_SOLUTION | RESPIRATORY_TRACT | Status: DC | PRN
  Administered 2015-02-12: 2.5 mg via RESPIRATORY_TRACT
  Filled 2015-02-08: qty 3

## 2015-02-08 MED ORDER — LIDOCAINE-PRILOCAINE 2.5-2.5 % EX CREA: 1.0000 "application " | TOPICAL_CREAM | CUTANEOUS | Status: DC | PRN

## 2015-02-08 MED ORDER — CINACALCET HCL 30 MG PO TABS
90.0000 mg | ORAL_TABLET | Freq: Every day | ORAL | Status: DC
  Administered 2015-02-09 – 2015-02-25 (×17): 90 mg via ORAL
  Filled 2015-02-08 (×18): qty 3

## 2015-02-08 MED ORDER — CARVEDILOL 3.125 MG PO TABS
3.1250 mg | ORAL_TABLET | Freq: Two times a day (BID) | ORAL | Status: DC
  Administered 2015-02-09 – 2015-02-25 (×26): 3.125 mg via ORAL
  Filled 2015-02-08 (×28): qty 1

## 2015-02-08 MED ORDER — DARBEPOETIN ALFA 60 MCG/0.3ML IJ SOSY
60.0000 ug | PREFILLED_SYRINGE | INTRAMUSCULAR | Status: DC
Start: 2015-02-12 — End: 2015-02-09

## 2015-02-08 MED ORDER — PANTOPRAZOLE SODIUM 40 MG PO TBEC
40.0000 mg | DELAYED_RELEASE_TABLET | Freq: Every day | ORAL | Status: DC
  Administered 2015-02-09 – 2015-02-25 (×17): 40 mg via ORAL
  Filled 2015-02-08 (×17): qty 1

## 2015-02-08 MED ORDER — BISACODYL 10 MG RE SUPP
10.0000 mg | Freq: Every day | RECTAL | Status: DC | PRN
Start: 2015-02-08 — End: 2015-02-25
  Administered 2015-02-09: 10 mg via RECTAL
  Filled 2015-02-08: qty 1

## 2015-02-08 MED ORDER — INSULIN ASPART 100 UNIT/ML ~~LOC~~ SOLN
0.0000 [IU] | Freq: Three times a day (TID) | SUBCUTANEOUS | Status: DC
  Administered 2015-02-08 (×2): 2 [IU] via SUBCUTANEOUS

## 2015-02-08 MED ORDER — WARFARIN SODIUM 5 MG PO TABS: 5.0000 mg | ORAL_TABLET | Freq: Once | ORAL | Status: DC

## 2015-02-08 MED ORDER — LIDOCAINE HCL (PF) 1 % IJ SOLN: 5.0000 mL | INTRAMUSCULAR | Status: DC | PRN

## 2015-02-08 MED ORDER — HEPARIN SODIUM (PORCINE) 1000 UNIT/ML DIALYSIS: 20.0000 [IU]/kg | INTRAMUSCULAR | Status: DC | PRN

## 2015-02-08 MED ORDER — SODIUM CHLORIDE 0.9 % IV SOLN: 100.0000 mL | INTRAVENOUS | Status: DC | PRN

## 2015-02-08 MED ORDER — WARFARIN SODIUM 5 MG PO TABS
5.0000 mg | ORAL_TABLET | Freq: Once | ORAL | Status: DC
  Administered 2015-02-08: 5 mg via ORAL
  Filled 2015-02-08 (×2): qty 1

## 2015-02-08 MED ORDER — CELECOXIB 200 MG PO CAPS
200.0000 mg | ORAL_CAPSULE | Freq: Every day | ORAL | Status: DC | PRN
  Filled 2015-02-08: qty 1

## 2015-02-08 MED ORDER — ACETAMINOPHEN 325 MG PO TABS: 325.0000 mg | ORAL_TABLET | ORAL | Status: DC | PRN

## 2015-02-08 MED ORDER — TRAMADOL HCL 50 MG PO TABS
50.0000 mg | ORAL_TABLET | Freq: Four times a day (QID) | ORAL | Status: DC | PRN
  Administered 2015-02-13 – 2015-02-19 (×3): 50 mg via ORAL
  Filled 2015-02-08 (×4): qty 1

## 2015-02-08 MED ORDER — ONDANSETRON HCL 4 MG PO TABS: 4.0000 mg | ORAL_TABLET | Freq: Four times a day (QID) | ORAL | Status: DC | PRN

## 2015-02-08 NOTE — Discharge Instructions (Signed)
Information on my medicine - Coumadin   (Warfarin)  This medication education was reviewed with me or my healthcare representative as part of my discharge preparation.  The pharmacist that spoke with me during my hospital stay was:  [facility], Margot Chimes, [facility]  Why was Coumadin prescribed for you? Coumadin was prescribed for you because you have a blood clot or a medical condition that can cause an increased risk of forming blood clots. Blood clots can cause serious health problems by blocking the flow of blood to the heart, lung, or brain. Coumadin can prevent harmful blood clots from forming. As a reminder your indication for Coumadin is:   Select from menu  What test will check on my response to Coumadin? While on Coumadin (warfarin) you will need to have an INR test regularly to ensure that your dose is keeping you in the desired range. The INR (international normalized ratio) number is calculated from the result of the laboratory test called prothrombin time (PT).  If an INR APPOINTMENT HAS NOT ALREADY BEEN MADE FOR YOU please schedule an appointment to have this lab work done by your health care provider within 7 days. Your INR goal is usually a number between:  2 to 3 or your provider may give you a more narrow range like 2-2.5.  Ask your health care provider during an office visit what your goal INR is.  What  do you need to  know  About  COUMADIN? Take Coumadin (warfarin) exactly as prescribed by your healthcare provider about the same time each day.  DO NOT stop taking without talking to the doctor who prescribed the medication.  Stopping without other blood clot prevention medication to take the place of Coumadin may increase your risk of developing a new clot or stroke.  Get refills before you run out.  What do you do if you miss a dose? If you miss a dose, take it as soon as you remember on the same day then continue your regularly scheduled regimen the next day.  Do not take two  doses of Coumadin at the same time.  Important Safety Information A possible side effect of Coumadin (Warfarin) is an increased risk of bleeding. You should call your healthcare provider right away if you experience any of the following: ? Bleeding from an injury or your nose that does not stop. ? Unusual colored urine (red or dark brown) or unusual colored stools (red or black). ? Unusual bruising for unknown reasons. ? A serious fall or if you hit your head (even if there is no bleeding).  Some foods or medicines interact with Coumadin (warfarin) and might alter your response to warfarin. To help avoid this: ? Eat a balanced diet, maintaining a consistent amount of Vitamin K. ? Notify your provider about major diet changes you plan to make. ? Avoid alcohol or limit your intake to 1 drink for women and 2 drinks for men per day. (1 drink is 5 oz. wine, 12 oz. beer, or 1.5 oz. liquor.)  Make sure that ANY health care provider who prescribes medication for you knows that you are taking Coumadin (warfarin).  Also make sure the healthcare provider who is monitoring your Coumadin knows when you have started a new medication including herbals and non-prescription products.  Coumadin (Warfarin)  Major Drug Interactions  Increased Warfarin Effect Decreased Warfarin Effect  Alcohol (large quantities) Antibiotics (esp. Septra/Bactrim, Flagyl, Cipro) Amiodarone (Cordarone) Aspirin (ASA) Cimetidine (Tagamet) Megestrol (Megace) NSAIDs (ibuprofen, naproxen, etc.) Piroxicam (  Feldene) °Propafenone (Rythmol SR) °Propranolol (Inderal) °Isoniazid (INH) °Posaconazole (Noxafil) Barbiturates (Phenobarbital) °Carbamazepine (Tegretol) °Chlordiazepoxide (Librium) °Cholestyramine (Questran) °Griseofulvin °Oral Contraceptives °Rifampin °Sucralfate (Carafate) °Vitamin K  ° °Coumadin® (Warfarin) Major Herbal Interactions  °Increased Warfarin Effect Decreased Warfarin Effect  °Garlic °Ginseng °Ginkgo biloba Coenzyme  Q10 °Green tea °St. John’s wort   ° °Coumadin® (Warfarin) FOOD Interactions  °Eat a consistent number of servings per week of foods HIGH in Vitamin K °(1 serving = ½ cup)  °Collards (cooked, or boiled & drained) °Kale (cooked, or boiled & drained) °Mustard greens (cooked, or boiled & drained) °Parsley *serving size only = ¼ cup °Spinach (cooked, or boiled & drained) °Swiss chard (cooked, or boiled & drained) °Turnip greens (cooked, or boiled & drained)  °Eat a consistent number of servings per week of foods MEDIUM-HIGH in Vitamin K °(1 serving = 1 cup)  °Asparagus (cooked, or boiled & drained) °Broccoli (cooked, boiled & drained, or raw & chopped) °Brussel sprouts (cooked, or boiled & drained) *serving size only = ½ cup °Lettuce, raw (green leaf, endive, romaine) °Spinach, raw °Turnip greens, raw & chopped  ° °These websites have more information on Coumadin (warfarin):  www.coumadin.com; °www.ahrq.gov/consumer/coumadin.htm; ° ° °

## 2015-02-08 NOTE — Interval H&P Note (Signed)
Stefanie [name] was admitted today to Inpatient Rehabilitation with the diagnosis of debility.  The patient's history has been reviewed, patient examined, and there is no change in status.  Patient continues to be appropriate for intensive inpatient rehabilitation.  I have reviewed the patient's chart and labs.  Questions were answered to the patient's satisfaction. The PAPE has been reviewed and assessment remains appropriate.  Helio Lack T 02/08/2015, 6:45 PM

## 2015-02-08 NOTE — H&P (Signed)
Physical Medicine and Rehabilitation Admission H&P   CC: Weakness     HPI: [name] Cowsert is a 69 y.o. female with history fo HTN, DM type 2 with nephropathy, ESRD, CAD who was admitted from [facility] on 02/01/15 with SOB, left leg discomfort with edema and Left proximal femoral DVT. CTA lungs negative for PE and she was started on IV heparin. Dr. Cathie Olden consulted for input due to progressive SOB limiting activity. Myoview done showing high risk with mixed infarct and ischemia therefore cath recommended for workup. Cardiac cath showed left ventricular EF < 25% with global hypokinesis and 100% stenosis 1st Diag lesion, 100% stenosed. Mid RCA lesion, 100% stenosed. faint left to right collaterals are present. To be treated medically per Dr. Burt Knack. Patient with history of left thigh AVG and Dr. Scot Dock consulted for input. 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 Oneida Alar on 11/23. PT evaluation done post procedure and CIR recommended due to deconditioned state, decreased balance, poor safety and inability to walk.    Review of Systems  HENT: Negative for hearing loss.   Eyes: Negative for blurred vision and double vision.  Respiratory: Positive for shortness of breath. Negative for cough.   Cardiovascular: Positive for claudication. Negative for chest pain and palpitations.  Gastrointestinal: Negative for heartburn, nausea and abdominal pain.  Genitourinary:       Does not produce urine  Musculoskeletal: Negative for myalgias, back pain and neck pain.  Neurological: Positive for weakness. Negative for dizziness, tingling, focal weakness and headaches.  Psychiatric/Behavioral: Negative for memory loss. The patient does not have insomnia.       Past Medical History  Diagnosis Date  . Hypertension   . Hyperlipidemia   . Diabetes mellitus   . CVA (cerebral vascular accident)  ([place]) [place]    left sided weakness  . Protein malnutrition ([place])   . Secondary hyperparathyroidism ([place])   . Anemia   . End stage renal disease ([place])     initiated HD 2006  . Hx of echocardiogram     a. Echo 03/2010: EF 55-60%, Gr 1 diast dysfn, trivial MS, mild to mod LAE, PASP 34, ;  b. Echo 11/13: mild LVH, EF 60%, Gr 1 diast dysfn, Ao sclerosis, no AS, MAC, slight MS, mean 3 mmHg, PASP 34  . Hx of cardiovascular stress test     a. MV 03/2010:  no ischemia, EF 55%  . CHF (congestive heart failure) ([place])   . Retinal detachment, bilateral   . CAD (coronary artery disease)     Past Surgical History  Procedure Laterality Date  . Cataract extraction    . Tubal ligation    . Arteriovenous graft placement      BUE numerous times  . Arteriovenous graft placement  11/13/10    Lt femoral - Dr. Kellie Simmering  . Dg av dialysis graft declot or  06/06/11, 08/26/11    performed in IR  . Shuntogram N/A 05/29/2013    Procedure: Earney Mallet;  Surgeon: Conrad [place], MD;  Location: [facility] C[facility] LAB;  Service: Cardiovascular;  Laterality: N/A;  . Shuntogram Left 03/01/2014    Procedure: SHUNTOGRAM;  Surgeon: Conrad [place], MD;  Location: [facility] CATH LAB;  Service: Cardiovascular;  Laterality: Left;  . Cardiac catheterization N/A 02/05/2015    Procedure: Left Heart Cath and Coronary Angiography;  Surgeon: Jettie Booze, MD;  Location: [place] CV  LAB;  Service: Cardiovascular;  Laterality: N/A;  . Peripheral vascular catheterization N/A 02/06/2015    Procedure: A/V Shuntogram;  Surgeon: Elam Dutch, MD;  Location: [place] CV LAB;  Service: Cardiovascular;  Laterality: N/A;    Family History  Problem Relation Age of Onset  . Cardiomyopathy    . Heart disease Mother   . Diabetes Mother   . Hypertension Mother   . Heart attack Mother   . Diabetes Other   . Kidney disease Other   . Diabetes Daughter   . Diabetes Son   . Hypertension Son     Social History:  Lives alone and independent with  use of walker. Has an aide 2-3 hours/7 days a week to help with Meals/housework. She reports that she quit smoking about 31 years ago. She has never used smokeless tobacco. She reports that she does not drink alcohol or use illicit drugs.    Allergies: No Known Allergies    Medications Prior to Admission  Medication Sig Dispense Refill  . calcium acetate (ELIPHOS) 667 MG tablet Take 667 mg by mouth 3 (three) times daily with meals. Take 3 capsule in the morning, 2 capsules with a snack and 3 capsules in the evening.    . celecoxib (CELEBREX) 200 MG capsule Take 200 mg by mouth daily as needed.  1  . cinacalcet (SENSIPAR) 30 MG tablet Take 30 mg by mouth at bedtime.    . cinacalcet (SENSIPAR) 90 MG tablet Take 90 mg by mouth daily.    . folic acid-vitamin b complex-vitamin c-selenium-zinc (DIALYVITE) 3 MG TABS tablet Take 1 tablet by mouth daily.    Marland Kitchen gabapentin (NEURONTIN) 300 MG capsule Take 300 mg by mouth at bedtime.    . insulin aspart (NOVOLOG) 100 UNIT/ML injection Inject 14 Units into the skin 2 (two) times daily as needed for high blood sugar.    . latanoprost (XALATAN) 0.005 % ophthalmic solution Place 1 drop into the left eye at bedtime.    Marland Kitchen lisinopril (PRINIVIL,ZESTRIL) 20 MG tablet Take 20 mg by mouth daily.     Marland Kitchen lisinopril (PRINIVIL,ZESTRIL) 30 MG tablet Take 30 mg by mouth daily.    Marland Kitchen omeprazole (PRILOSEC) 40 MG capsule Take 40 mg by mouth daily.    . pravastatin (PRAVACHOL) 40 MG tablet Take 40 mg by mouth daily.     Marland Kitchen PROAIR HFA 108 (90 BASE) MCG/ACT inhaler Inhale 1 puff into the lungs every 4 (four) hours as needed for wheezing or shortness of breath.     . silver sulfADIAZINE (SILVADENE) 1 % cream APPLY A 1/16 INCH (1.5 MM) THICK LAYER TO ENTIRE BURN AREA BY TOPICAL ROUTE ONCE DAILY  0  . traMADol (ULTRAM) 50 MG tablet Take 50 mg by mouth every 6 (six) hours as needed for moderate pain.    Marland Kitchen VOLTAREN 1 % GEL APPLY 2 GRAMS TO AREA UP TO 4 TIMES A DAY  2  .  diphenoxylate-atropine (LOMOTIL) 2.5-0.025 MG per tablet   0  . ketoconazole (NIZORAL) 2 % cream Apply 1 application topically daily. 15 g 0  . levofloxacin (LEVAQUIN) 750 MG tablet   0  . ofloxacin (OCUFLOX) 0.3 % ophthalmic solution     . rOPINIRole (REQUIP) 0.5 MG tablet Take 0.5 mg by mouth at bedtime.  12    Home: Home Living Family/patient expects to be discharged to:: Private residence Living Arrangements: Alone Available Help at Discharge: Personal care attendant (M-Sunday 2.5 every day except monday 3 hours)  Type of Home: Apartment Home Access: Elevator Home Layout: One level Bathroom Shower/Tub: Multimedia programmer: Standard (3n1 over toilet) Home Equipment: Walker - 4 wheels, Shower seat Additional Comments: pt has an aide 7 days a week for 2.5 hours except monday she has 3 hours (A). Pt otherwise alone per patient   Functional History: Prior Function Level of Independence: Independent with assistive device(s)  Functional Status:  Mobility: Bed Mobility Overal bed mobility: Needs Assistance Bed Mobility: Supine to Sit Supine to sit: Supervision General bed mobility comments: HOB elevated, cues to push from bed, required use of bed rail Transfers Overall transfer level: Needs assistance Equipment used: 4-wheeled walker Transfers: Sit to/from Stand, Stand Pivot Transfers Sit to Stand: Min assist General transfer comment: cues for hand and feet placement, required min assist to power up. Needed two trials to stand, second trial was successful when feet moved closer to bed. Pt required VCs to stand straight and lift head during stand pivot      ADL: ADL Overall ADL's : Needs assistance/impaired Eating/Feeding: NPO Grooming: Wash/dry face, Supervision/safety Lower Body Dressing: Moderate assistance, Sit to/from stand Lower Body Dressing Details (indicate cue type and reason): pt able to doff socks but difficulty applying socks Toilet Transfer: Maximal  assistance, RW, Ambulation Toilet Transfer Details (indicate cue type and reason): cues for hand placement and heavy use of grab bar. Pt unable to complete sit<>stand from low surface Toileting- Clothing Manipulation and Hygiene: Maximal assistance Functional mobility during ADLs: Moderate assistance General ADL Comments: Pt requires (A) with sit<>Stand and use of 4ww. pt currently with 4ww brakes lose and not locking.  Pt with DOE and requires rest break between transfers. Pt with HR 80s with movement  Cognition: Cognition Overall Cognitive Status: Within Functional Limits for tasks assessed Orientation Level: Oriented X4 Cognition Arousal/Alertness: Awake/alert Behavior During Therapy: WFL for tasks assessed/performed Overall Cognitive Status: Within Functional Limits for tasks assessed    Blood pressure 135/60, pulse 56, temperature 98.6 F (37 C), temperature source Oral, resp. rate 21, height _0  (1.626 m), weight 85.1 kg (187 lb 9.8 oz), SpO2 100 %. Physical Exam  Nursing note and vitals reviewed. Constitutional: She is oriented to person, place, and time. She appears well-developed and well-nourished.  HENT:  Head: Normocephalic and atraumatic.  Mouth/Throat: Oropharynx is clear and moist.  Eyes: Conjunctivae are normal. Pupils are equal, round, and reactive to light.  Neck: Normal range of motion. Neck supple.  Cardiovascular: Normal rate and regular rhythm.   No murmur heard. Respiratory: Effort normal and breath sounds normal. No respiratory distress. She has no wheezes.  GI: Soft. She exhibits no distension. There is no tenderness.  Musculoskeletal: She exhibits edema (min edema left thigh). She exhibits no tenderness.  AVG in left groin. No pain with palpation of LLE. Has some decreased sensation RLE.   Neurological: She is alert and oriented to person, place, and time.  Speech clear and able to follow basic commands without difficulty. Sensory deficits RLE? Does  sense pain in foot/ankle area. UE: 4/5 prox to distal. LE: 2/5LLE with hf, LLE KE and ADF/APF 3/5. RLE: 3/5 hf, 4/5 ke and adf/apf  Skin: Skin is warm and dry. No rash noted. No erythema.  Psychiatric: She has a normal mood and affect. Her behavior is normal. Thought content normal.    Results for orders placed or performed during the hospital encounter of 02/01/15 (from the past 48 hour(s))  Glucose, capillary     Status: Abnormal  Collection Time: 02/06/15 12:53 PM  Result Value Ref Range   Glucose-Capillary 130 (H) 65 - 99 mg/dL  Glucose, capillary     Status: Abnormal   Collection Time: 02/06/15  5:20 PM  Result Value Ref Range   Glucose-Capillary 253 (H) 65 - 99 mg/dL  Glucose, capillary     Status: Abnormal   Collection Time: 02/06/15  9:11 PM  Result Value Ref Range   Glucose-Capillary 161 (H) 65 - 99 mg/dL   Comment 1 Notify RN    Comment 2 Document in Chart   CBC     Status: Abnormal   Collection Time: 02/07/15  3:51 AM  Result Value Ref Range   WBC 4.8 4.0 - 10.5 K/uL   RBC 2.78 (L) 3.87 - 5.11 MIL/uL   Hemoglobin 8.8 (L) 12.0 - 15.0 g/dL   HCT 27.7 (L) 36.0 - 46.0 %   MCV 99.6 78.0 - 100.0 fL   MCH 31.7 26.0 - 34.0 pg   MCHC 31.8 30.0 - 36.0 g/dL   RDW 15.3 11.5 - 15.5 %   Platelets 136 (L) 150 - 400 K/uL  Glucose, capillary     Status: Abnormal   Collection Time: 02/07/15  6:37 AM  Result Value Ref Range   Glucose-Capillary 164 (H) 65 - 99 mg/dL   Comment 1 Notify RN    Comment 2 Document in Chart   Glucose, capillary     Status: Abnormal   Collection Time: 02/07/15 11:56 AM  Result Value Ref Range   Glucose-Capillary 182 (H) 65 - 99 mg/dL   Comment 1 Notify RN    Comment 2 Document in Chart   Heparin level (unfractionated)     Status: Abnormal   Collection Time: 02/07/15  3:55 PM  Result Value Ref Range   Heparin Unfractionated 0.28 (L) 0.30 - 0.70 IU/mL    Comment:        IF HEPARIN RESULTS ARE BELOW EXPECTED VALUES, AND PATIENT DOSAGE HAS BEEN  CONFIRMED, SUGGEST FOLLOW UP TESTING OF ANTITHROMBIN III LEVELS.   Glucose, capillary     Status: Abnormal   Collection Time: 02/07/15  4:38 PM  Result Value Ref Range   Glucose-Capillary 252 (H) 65 - 99 mg/dL   Comment 1 Notify RN    Comment 2 Document in Chart   Glucose, capillary     Status: Abnormal   Collection Time: 02/07/15  8:52 PM  Result Value Ref Range   Glucose-Capillary 182 (H) 65 - 99 mg/dL  CBC     Status: Abnormal   Collection Time: 02/08/15  2:51 AM  Result Value Ref Range   WBC 5.2 4.0 - 10.5 K/uL   RBC 2.68 (L) 3.87 - 5.11 MIL/uL   Hemoglobin 8.5 (L) 12.0 - 15.0 g/dL   HCT 26.8 (L) 36.0 - 46.0 %   MCV 100.0 78.0 - 100.0 fL   MCH 31.7 26.0 - 34.0 pg   MCHC 31.7 30.0 - 36.0 g/dL   RDW 15.8 (H) 11.5 - 15.5 %   Platelets 144 (L) 150 - 400 K/uL  Heparin level (unfractionated)     Status: None   Collection Time: 02/08/15  2:51 AM  Result Value Ref Range   Heparin Unfractionated 0.40 0.30 - 0.70 IU/mL    Comment:        IF HEPARIN RESULTS ARE BELOW EXPECTED VALUES, AND PATIENT DOSAGE HAS BEEN CONFIRMED, SUGGEST FOLLOW UP TESTING OF ANTITHROMBIN III LEVELS.   Protime-INR     Status: None  Collection Time: 02/08/15  2:51 AM  Result Value Ref Range   Prothrombin Time 14.8 11.6 - 15.2 seconds   INR 1.14 0.00 - 1.49  Glucose, capillary     Status: Abnormal   Collection Time: 02/08/15  7:27 AM  Result Value Ref Range   Glucose-Capillary 142 (H) 65 - 99 mg/dL  Renal function panel     Status: Abnormal   Collection Time: 02/08/15  9:00 AM  Result Value Ref Range   Sodium 133 (L) 135 - 145 mmol/L   Potassium 4.6 3.5 - 5.1 mmol/L   Chloride 97 (L) 101 - 111 mmol/L   CO2 27 22 - 32 mmol/L   Glucose, Bld 179 (H) 65 - 99 mg/dL   BUN 50 (H) 6 - 20 mg/dL   Creatinine, Ser 9.45 (H) 0.44 - 1.00 mg/dL   Calcium 9.7 8.9 - 10.3 mg/dL   Phosphorus 3.6 2.5 - 4.6 mg/dL   Albumin 2.9 (L) 3.5 - 5.0 g/dL   GFR calc non Af Amer 4 (L) >60 mL/min   GFR calc Af Amer 4 (L)  >60 mL/min    Comment: (NOTE) The eGFR has been calculated using the CKD EPI equation. This calculation has not been validated in all clinical situations. eGFR's persistently <60 mL/min signify possible Chronic Kidney Disease.    Anion gap 9 5 - 15   No results found.     Medical Problem List and Plan: 1. Functional deficits secondary to Debility with weakness and balance deficits  2.  LLE DVT Prophylaxis/Anticoagulation: Pharmaceutical: Coumadin 3. Pain Management: On Neurontin at nights for neuropathy? Continue tylenol prn 4. Mood: LCSW to follow up for evaluation and support.  5. Neuropsych: This patient is capable of making decisions on her own behalf. 6. Skin/Wound Care: Routine pressure relief measures 7. Fluids/Electrolytes/Nutrition: Continue 1200 cc FR daily.   8. CAD: Treat medically and monitor for symptoms/tolerance with increase in activity. 9.  HTN: monitor BP bid on lisinopril and coreg.  10. ESRD:  Nephrology to follow for assistance. Continue HD MWF in afternoons to help with tolerance of therapy during the day. 11. DM type 2: Used novolog bid prn at home. Continue to monitor BS ac/hs and use SSI for elevated BS.  12. Anemia of chronic disease: On Aranesp. Monitor and transfuse prn symptoms or Hgb less than 7.0 13. Thrombocytopenia: Monitor closely with heparin on board. Monitor for signs of bleeding.  14. PVD:  On plavix due to multiple stents left iliac vein.    Post Admission Physician Evaluation: 1. Weakness, balance deficits related to debility after multiple medical issues 2. Patient is admitted to receive collaborative, interdisciplinary care between the physiatrist, rehab nursing staff, and therapy team. 3. Patient's level of medical complexity and substantial therapy needs in context of that medical necessity cannot be provided at a lesser intensity of care such as a SNF. 4. Patient has experienced substantial functional loss from his/her baseline which  was documented above under the "Functional History" and "Functional Status" headings.  Judging by the patient's diagnosis, physical exam, and functional history, the patient has potential for functional progress which will result in measurable gains while on inpatient rehab.  These gains will be of substantial and practical use upon discharge  in facilitating mobility and self-care at the household level. 5. Physiatrist will provide 24 hour management of medical needs as well as oversight of the therapy plan/treatment and provide guidance as appropriate regarding the interaction of the two. 6. 24 hour rehab  nursing will assist with bladder management, bowel management, safety, skin/wound care, disease management, medication administration, pain management and patient education  and help integrate therapy concepts, techniques,education, etc. 7. PT will assess and treat for/with: Lower extremity strength, range of motion, stamina, balance, functional mobility, safety, adaptive techniques and equipment, pain mgt, exercise capacity, community reintegration, transfers/mobility after HD.   Goals are: mod I. 8. OT will assess and treat for/with: ADL's, functional mobility, safety, upper extremity strength, adaptive techniques and equipment, pain mgt, balance, ego support, community reintegration.   Goals are: mod I. Therapy may proceed with showering this patient. 9. SLP will assess and treat for/with: n/a.  Goals are: n/a. 10. Case Management and Social Worker will assess and treat for psychological issues and discharge planning. 11. Team conference will be held weekly to assess progress toward goals and to determine barriers to discharge. 12. Patient will receive at least 3 hours of therapy per day at least 5 days per week. 13. ELOS: 10-14 days       14. Prognosis:  excellent     Meredith Staggers, MD, [facility] 02/08/2015   02/08/2015

## 2015-02-08 NOTE — PMR Pre-admission (Signed)
PMR Admission Coordinator Pre-Admission Assessment  Patient: Stefanie [name] is an 69 y.o., female MRN: [id number] DOB: 19-Sep-1945 Height: 5\' 4"  (162.6 cm) Weight: 82.4 kg (181 lb 10.5 oz)              Insurance Information HMO:      PPO:       PCP:       IPA:       80/20:       OTHER:   PRIMARY: Medicare A/B      Policy#: [id number]      Subscriber: self CM Name:        Phone#:       Fax#:   Pre-Cert#:        Employer:  Retired Benefits:  Phone #:       Name: Checked in [place]. Date: 09/14/91     Deduct: $1288      Out of Pocket Max: none      Life Max: unlimited CIR: 100%      SNF: 100 days Outpatient: 80%     Co-Pay: 20% Home Health: 100%      Co-Pay:  none DME: 80%     Co-Pay: 20% Providers: patient's choice  SECONDARY: [place]      Policy#: [id number]      Subscriber: Self CM Name:        Phone#:       Fax#:   Pre-Cert#:        Employer:  Retired Benefits:  Phone #: [phone]     Name: Automated Eff. Date:  Eligible 02/08/15     Deduct:        Out of Pocket Max:        Life Max:   CIR:        SNF:   Outpatient:       Co-Pay:   Home Health:        Co-Pay:   DME:       Co-Pay:    Emergency Contact Information Contact Information    Name Relation Home Work Mobile   Mack,Faleica Daughter [phone]     Candie Echevaria [phone]       Current Medical History  Patient Admitting Diagnosis: Debility   History of Present Illness: A 68 y.o. female with history of HTN, DM type 2 with nephropathy, ESRD, CAD who was admitted from [facility] on 02/01/15 with SOB, left leg discomfort with edema and Left proximal femoral DVT. CTA lungs negative for PE and she was started on IV heparin. Dr. Cathie Olden consulted for input due to progressive SOB limiting activity. Myoview done showing high risk with mixed infarct and ischemia therefore cath recommended for workup. Cardiac cath showed left ventricular EF < 25% with global hypokinesis and 100% stenosis 1st Diag lesion, 100%  stenosed. Mid RCA lesion, 100% stenosed. faint left to right collaterals are present. To be treated medically per Dr. Burt Knack. Patient with history of left thigh AVG and Dr. Scot Dock consulted for input. 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 Oneida Alar on 11/23. PT evaluation done post procedure and CIR recommended due to deconditioned state, decreased balance, poor safety and inability to walk.      Past Medical History  Past Medical History  Diagnosis Date  . Hypertension   . Hyperlipidemia   . Diabetes mellitus   . CVA (cerebral vascular accident) ([place]) [place]  left sided weakness  . Protein malnutrition ([place])   . Secondary hyperparathyroidism ([place])   . Anemia   . End stage renal disease ([place])     initiated HD 2006  . Hx of echocardiogram     a. Echo 03/2010: EF 55-60%, Gr 1 diast dysfn, trivial MS, mild to mod LAE, PASP 34, ;  b. Echo 11/13: mild LVH, EF 60%, Gr 1 diast dysfn, Ao sclerosis, no AS, MAC, slight MS, mean 3 mmHg, PASP 34  . Hx of cardiovascular stress test     a. MV 03/2010:  no ischemia, EF 55%  . CHF (congestive heart failure) ([place])   . Retinal detachment, bilateral   . CAD (coronary artery disease)     Family History  family history includes Cardiomyopathy in an other family member; Diabetes in her daughter, mother, other, and son; Heart attack in her mother; Heart disease in her mother; Hypertension in her mother and son; Kidney disease in her other.  Prior Rehab/Hospitalizations: No previous rehab admissions.  Has the patient had major surgery during 100 days prior to admission? No  Current Medications   Current facility-administered medications:  .  0.9 %  sodium chloride infusion, 100 mL, Intravenous, PRN, Valentina Gu, NP .  0.9 %  sodium chloride infusion, 100 mL, Intravenous, PRN, Valentina Gu, NP .  acetaminophen (TYLENOL)  tablet 325-650 mg, 325-650 mg, Oral, Q4H PRN **OR** acetaminophen (TYLENOL) suppository 325-650 mg, 325-650 mg, Rectal, Q4H PRN, Elam Dutch, MD .  albuterol (PROVENTIL) (2.5 MG/3ML) 0.083% nebulizer solution 2.5 mg, 2.5 mg, Inhalation, Q4H PRN, Ivor Costa, MD, 2.5 mg at 02/05/15 2226 .  calcitRIOL (ROCALTROL) capsule 1.5 mcg, 1.5 mcg, Oral, Q T,Th,Sa-HD, Alric Seton, PA-C, 1.5 mcg at 02/07/15 1233 .  calcium acetate (PHOSLO) capsule 667 mg, 667 mg, Oral, TID WC, Ivor Costa, MD, 667 mg at 02/08/15 0750 .  carvedilol (COREG) tablet 3.125 mg, 3.125 mg, Oral, BID WC, Barton Dubois, MD, 3.125 mg at 02/08/15 0750 .  celecoxib (CELEBREX) capsule 200 mg, 200 mg, Oral, Daily PRN, Ivor Costa, MD .  cinacalcet (SENSIPAR) tablet 90 mg, 90 mg, Oral, Q breakfast, Jettie Booze, MD, 90 mg at 02/08/15 0750 .  clopidogrel (PLAVIX) tablet 75 mg, 75 mg, Oral, Daily, Ivor Costa, MD, 75 mg at 02/08/15 1400 .  Darbepoetin Alfa (ARANESP) injection 60 mcg, 60 mcg, Intravenous, Q Tue-HD, Alric Seton, PA-C, 60 mcg at 02/05/15 1050 .  diclofenac sodium (VOLTAREN) 1 % transdermal gel 2 g, 2 g, Topical, QID PRN, Ivor Costa, MD .  docusate sodium (COLACE) capsule 100 mg, 100 mg, Oral, BID, Ripudeep K Rai, MD, 100 mg at 02/08/15 1400 .  gabapentin (NEURONTIN) capsule 300 mg, 300 mg, Oral, QHS, Ivor Costa, MD, 300 mg at 02/07/15 2232 .  guaiFENesin (MUCINEX) 12 hr tablet 600 mg, 600 mg, Oral, BID PRN, Ivor Costa, MD, 600 mg at 02/08/15 1407 .  heparin ADULT infusion 100 units/mL (25000 units/250 mL), 1,200 Units/hr, Intravenous, Continuous, Durwin Nora, [facility], Last Rate: 12 mL/hr at 02/08/15 0436, 1,200 Units/hr at 02/08/15 0436 .  heparin injection 1,000 Units, 1,000 Units, Dialysis, PRN, Valentina Gu, NP .  hydrALAZINE (APRESOLINE) injection 5 mg, 5 mg, Intravenous, Q20 Min PRN, Elam Dutch, MD .  insulin aspart (novoLOG) injection 0-15 Units, 0-15 Units, Subcutaneous, TID WC, Ripudeep Krystal Eaton, MD, 2 Units  at 02/08/15 0753 .  insulin aspart (novoLOG) injection 0-5 Units, 0-5 Units, Subcutaneous, QHS, Ripudeep Krystal Eaton, MD .  ketoconazole (NIZORAL) 2 % cream 1 application, 1 application, Topical, Daily, Ivor Costa, MD, 1 application at [id number] .  labetalol (NORMODYNE,TRANDATE) injection 10 mg, 10 mg, Intravenous, Q10 min PRN, Elam Dutch, MD .  latanoprost (XALATAN) 0.005 % ophthalmic solution 1 drop, 1 drop, Left Eye, QHS, Jettie Booze, MD, 1 drop at 02/07/15 2233 .  lidocaine (PF) (XYLOCAINE) 1 % injection 5 mL, 5 mL, Intradermal, PRN, Valentina Gu, NP .  lidocaine-prilocaine (EMLA) cream 1 application, 1 application, Topical, PRN, Valentina Gu, NP .  lisinopril (PRINIVIL,ZESTRIL) tablet 20 mg, 20 mg, Oral, Daily, Ivor Costa, MD, 20 mg at 02/08/15 1401 .  metoprolol (LOPRESSOR) injection 2-5 mg, 2-5 mg, Intravenous, Q2H PRN, Elam Dutch, MD .  morphine 2 MG/ML injection 2 mg, 2 mg, Intravenous, Q4H PRN, Ivor Costa, MD .  multivitamin (RENA-VIT) tablet 1 tablet, 1 tablet, Oral, QHS, Ivor Costa, MD, 1 tablet at 02/07/15 2232 .  nitroGLYCERIN (NITROSTAT) SL tablet 0.4 mg, 0.4 mg, Sublingual, Q5 min PRN, Ivor Costa, MD .  ofloxacin (OCUFLOX) 0.3 % ophthalmic solution 1 drop, 1 drop, Both Eyes, QID, Ivor Costa, MD, 1 drop at 02/07/15 2233 .  ondansetron (ZOFRAN) injection 4 mg, 4 mg, Intravenous, Q6H PRN, Elam Dutch, MD .  ondansetron [facility]) tablet 4 mg, 4 mg, Oral, Q6H PRN **OR** [DISCONTINUED] ondansetron (ZOFRAN) injection 4 mg, 4 mg, Intravenous, Q6H PRN, Ivor Costa, MD .  pantoprazole (PROTONIX) EC tablet 40 mg, 40 mg, Oral, Daily, Ivor Costa, MD, 40 mg at 02/08/15 1400 .  pentafluoroprop-tetrafluoroeth (GEBAUERS) aerosol 1 application, 1 application, Topical, PRN, Valentina Gu, NP .  polyethylene glycol (MIRALAX / GLYCOLAX) packet 17 g, 17 g, Oral, Daily, Barton Dubois, MD, 17 g at 02/07/15 1233 .  pravastatin (PRAVACHOL) tablet 40 mg, 40 mg, Oral, Daily,  Ivor Costa, MD, 40 mg at 02/07/15 1759 .  rOPINIRole (REQUIP) tablet 0.5 mg, 0.5 mg, Oral, QHS, Ivor Costa, MD, 0.5 mg at 02/07/15 2233 .  sodium chloride 0.9 % injection 3 mL, 3 mL, Intravenous, Q12H, Ivor Costa, MD, 3 mL at 02/08/15 1402 .  traMADol (ULTRAM) tablet 50 mg, 50 mg, Oral, Q6H PRN, Jettie Booze, MD .  warfarin (COUMADIN) tablet 5 mg, 5 mg, Oral, ONCE-1800, Ripudeep K Rai, MD .  Warfarin - Pharmacist Dosing Inpatient, , Does not apply, q1800, Tegan K Magsam, RPH  Patients Current Diet: Diet renal/carb modified with fluid restriction Diet-HS Snack?: Nothing; Room service appropriate?: Yes; Fluid consistency:: Thin  Precautions / Restrictions Precautions Precautions: Fall Restrictions Weight Bearing Restrictions: No Other Position/Activity Restrictions: Confirmed with RN no longer on bedrest   Has the patient had 2 or more falls or a fall with injury in the past year?Yes.  Slid out of bed X 3 in the past year with no injury.  Prior Activity Level Community (5-7x/wk): Went out 4 X a week.  Goes to HD t-Th-Sat and to church services.  Home Assistive Devices / Equipment Home Assistive Devices/Equipment: Gilford Rile (specify type) Home Equipment: Walker - 4 wheels, Shower seat  Prior Device Use: Indicate devices/aids used by the patient prior to current illness, exacerbation or injury? Seated walker/Rollator  Prior Functional Level Prior Function Level of Independence: Independent with assistive device(s)  Self Care: Did the patient need help bathing, dressing, using the toilet or eating?  Independent  Indoor Mobility: Did the patient need assistance with walking from room to room (with or without device)? Independent  Stairs: Did the patient need  assistance with internal or external stairs (with or without device)? Independent  Functional Cognition: Did the patient need help planning regular tasks such as shopping or remembering to take medications? Independent  Current  Functional Level Cognition  Overall Cognitive Status: Within Functional Limits for tasks assessed Orientation Level: Oriented X4    Extremity Assessment (includes Sensation/Coordination)  Upper Extremity Assessment: RUE deficits/detail, LUE deficits/detail RUE Deficits / Details: 110 degrees LUE Deficits / Details: 100 degrees  Lower Extremity Assessment: Generalized weakness    ADLs  Overall ADL's : Needs assistance/impaired Eating/Feeding: NPO Grooming: Wash/dry face, Supervision/safety Lower Body Dressing: Moderate assistance, Sit to/from stand Lower Body Dressing Details (indicate cue type and reason): pt able to doff socks but difficulty applying socks Toilet Transfer: Maximal assistance, RW, Ambulation Toilet Transfer Details (indicate cue type and reason): cues for hand placement and heavy use of grab bar. Pt unable to complete sit<>stand from low surface Toileting- Clothing Manipulation and Hygiene: Maximal assistance Functional mobility during ADLs: Moderate assistance General ADL Comments: Pt requires (A) with sit<>Stand and use of 4ww. pt currently with 4ww brakes lose and not locking.  Pt with DOE and requires rest break between transfers. Pt with HR 80s with movement    Mobility  Overal bed mobility: Needs Assistance Bed Mobility: Supine to Sit Supine to sit: Supervision General bed mobility comments: HOB elevated, cues to push from bed, required use of bed rail    Transfers  Overall transfer level: Needs assistance Equipment used: 4-wheeled walker Transfers: Sit to/from Stand Sit to Stand: Min assist General transfer comment: cues for prep to stand    Ambulation / Gait / Stairs / Wheelchair Mobility  Ambulation/Gait Ambulation/Gait assistance: Museum/gallery curator (Feet): 65 Feet Assistive device: 4-wheeled walker Gait Pattern/deviations: Step-through pattern General Gait Details: stable paretic gait with limitations bil, mostly due to stiff swing  and foot flat bil and little dissociation from upper and lower trunk Gait velocity: slower Gait velocity interpretation: Below normal speed for age/gender    Posture / Balance Balance Overall balance assessment: Needs assistance Sitting-balance support: No upper extremity supported Sitting balance-Leahy Scale: Fair Postural control: Other (comment) (forward lean in sitting, needed VC to sit up and lift head) Standing balance support: Bilateral upper extremity supported Standing balance-Leahy Scale: Fair Standing balance comment: but generally reliant on the RW or her UE's for support    Special needs/care consideration BiPAP/CPAP No CPM No Continuous Drip IV Heparin drip Dialysis Yes        Days T-Th-Sat Life Vest No Oxygen No Special Bed No Trach Size No Wound Vac (area) No      Skin: No                              Bowel mgmt: Last BM 02/05/15 Bladder mgmt: Anuria Diabetic mgmt Yes, on insulin at home    Previous Home Environment Living Arrangements: Alone Available Help at Discharge: Personal care attendant (M-Sunday 2.5 every day except monday 3 hours) Type of Home: Apartment Home Layout: One level Home Access: Elevator Bathroom Shower/Tub: Multimedia programmer: Standard (3n1 over toilet) [place]: Yes Type of Home Care Services: [place] (if known): [place]? Additional Comments: pt has an aide 7 days a week for 2.5 hours except monday she has 3 hours (A). Pt otherwise alone per patient  Discharge Living Setting Plans for Discharge Living Setting: Alone, Apartment Type of Home at Discharge:  Apartment Discharge Home Layout: One level (Apt on 2nd floor.  Has an elevator.) Discharge Home Access: Level entry, Elevator Does the patient have any problems obtaining your medications?: No  Social/Family/Support Systems Patient Roles: Parent (Has 2 daughters and 1 son.  1 son deceased.) Contact Information: Aliene Altes -  daughter [phone] Anticipated Caregiver: self Ability/Limitations of Caregiver: Has mod I goals.  Dtr works here at [employer]. Caregiver Availability: Intermittent Discharge Plan Discussed with Primary Caregiver: Yes Is Caregiver In Agreement with Plan?: Yes Does Caregiver/Family have Issues with Lodging/Transportation while Pt is in Rehab?: No  Goals/Additional Needs Patient/Family Goal for Rehab: PT/OT mod I goals Expected length of stay: 10-14 days Cultural Considerations: AttendsConnected [employer] non-denominational church Dietary Needs: Renal, carb mod, fluid restricted diet Equipment Needs: TBD Special Service Needs: HD T-TH-Sat Pt/Family Agrees to Admission and willing to participate: Yes Program Orientation Provided & Reviewed with Pt/Caregiver Including Roles  & Responsibilities: Yes  Decrease burden of Care through IP rehab admission: N/A  Possible need for SNF placement upon discharge: Not planned  Patient Condition: This patient's condition remains as documented in the consult dated 02/08/15, in which the Rehabilitation Physician determined and documented that the patient's condition is appropriate for intensive rehabilitative care in an inpatient rehabilitation facility. Will admit to inpatient rehab today.  Preadmission Screen Completed By:  Retta Diones, 02/08/2015 2:58 PM ______________________________________________________________________   Discussed status with Dr. Naaman Plummer on 02/08/15 at 1458 and received telephone approval for admission today.  Admission Coordinator:  Retta Diones, time1458/Date11/25/16

## 2015-02-08 NOTE — Progress Notes (Signed)
Patient ID: Stefanie [name], female   DOB: [birth year], 69 y.o.   MRN: [id number] Patient admitted to 4W07 via bed, escorted by nursing staff.  Patient verbalized understanding of rehab process, signed fall safety agreement.  Patient appears to be in no immediate distress at this time. Brita Romp, RN

## 2015-02-08 NOTE — Procedures (Signed)
Patient was seen on dialysis and the procedure was supervised. BFR 400 Via L thigh AVG BP is 116/54.  Patient appears to be tolerating treatment well.

## 2015-02-08 NOTE — Progress Notes (Signed)
ANTICOAGULATION CONSULT NOTE - Follow Up Consult  Pharmacy Consult for Heparin Indication: DVT  No Known Allergies  Patient Measurements: Height: 5\' 4"  (162.6 cm) Weight: 181 lb 10.5 oz (82.4 kg) IBW/kg (Calculated) : 54.7 Heparin Dosing Weight: 70 kg  Vital Signs: Temp: 97.7 F (36.5 C) (11/25 1504) Temp Source: Oral (11/25 1504) BP: 140/58 mmHg (11/25 1504) Pulse Rate: 60 (11/25 1504)  Labs:  Recent Labs  02/06/15 0453 02/07/15 0351 02/07/15 1555 02/08/15 0251 02/08/15 0900 02/08/15 1431  HGB 9.2* 8.8*  --  8.5*  --   --   HCT 28.5* 27.7*  --  26.8*  --   --   PLT 125* 136*  --  144*  --   --   LABPROT  --   --   --  14.8  --   --   INR  --   --   --  1.14  --   --   HEPARINUNFRC  --   --  0.28* 0.40  --  0.64  CREATININE 5.93*  --   --   --  9.45*  --     Estimated Creatinine Clearance: 5.8 mL/min (by C-G formula based on Cr of 9.45).  Assessment: 69 year old female admitted 02/01/2015 from [facility] found to have common femoral vein DVT.  Patient to resume heparin and start Coumadin 02/07/2015 after having left thigh shuntogram w/ angioplasty 02/06/2015.  Heparin level remains therapeutic this afternoon.  Now s/p HD.  Goal of Therapy:  INR 2-3 Heparin level 0.3-0.7 units/ml Monitor platelets by anticoagulation protocol: Yes   Plan:  Cont heparin 1200 units/hr Per renal, stop Plavix when INR therapeutic Daily heparin level, INR, CBC Monitor for s/sx of bleeding  Uvaldo Rising, BCPS  Clinical Pharmacist Pager [phone]  02/08/2015 3:11 PM

## 2015-02-08 NOTE — Consult Note (Signed)
Physical Medicine and Rehabilitation Consult  Reason for Consult: Debilty Referring Physician: Dr. Tana Coast.    HPI: Stefanie [name] is a 69 y.o. female with history fo HTN, DM type 2 with nephropathy, ESRD, CAD who was admitted from [facility] on 02/01/15 with SOB, left leg discomfort with edema and Left proximal femoral DVT.  CTA lungs negative for PE and she was started on IV heparin.  Dr. Cathie Olden consulted for input due to progressive SOB limiting activity. Myoview done showing high risk with mixed infarct and ischemia therefore cath recommended for workup. Cardiac cath showed left ventricular EF < 25% with global hypokinesis and 100% stenosis 1st Diag lesion, 100% stenosed. Mid RCA lesion, 100% stenosed. faint left to right collaterals are present. To be treated medically per Dr. Burt Knack. Patient with history of left thigh AVG and Dr. Scot Dock consulted for input. 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 Oneida Alar on 11/23. PT evaluation done post procedure and CIR recommended due to deconditioned state, decreased balance, poor safety and inability to walk.    Review of Systems  HENT: Negative for hearing loss.   Eyes: Negative for blurred vision and double vision.  Respiratory: Positive for shortness of breath (chronic problem limiting acitvity for the past few months). Negative for cough and sputum production.   Cardiovascular: Negative for chest pain and palpitations.  Gastrointestinal: Positive for constipation. Negative for heartburn, nausea and diarrhea.  Musculoskeletal: Negative for myalgias, back pain and joint pain.  Skin: Negative for itching and rash.  Neurological: Positive for weakness. Negative for dizziness, tingling and headaches.  Psychiatric/Behavioral: The patient is not nervous/anxious and does not have insomnia.       Past Medical History  Diagnosis Date  .  Hypertension   . Hyperlipidemia   . Diabetes mellitus   . CVA (cerebral vascular accident) ([place]) [place]    left sided weakness  . Protein malnutrition ([place])   . Secondary hyperparathyroidism ([place])   . Anemia   . End stage renal disease ([place])     initiated HD 2006  . Hx of echocardiogram     a. Echo 03/2010: EF 55-60%, Gr 1 diast dysfn, trivial MS, mild to mod LAE, PASP 34, ;  b. Echo 11/13: mild LVH, EF 60%, Gr 1 diast dysfn, Ao sclerosis, no AS, MAC, slight MS, mean 3 mmHg, PASP 34  . Hx of cardiovascular stress test     a. MV 03/2010:  no ischemia, EF 55%  . CHF (congestive heart failure) ([place])   . Retinal detachment, bilateral   . CAD (coronary artery disease)     Past Surgical History  Procedure Laterality Date  . Cataract extraction    . Tubal ligation    . Arteriovenous graft placement      BUE numerous times  . Arteriovenous graft placement  11/13/10    Lt femoral - Dr. Kellie Simmering  . Dg av dialysis graft declot or  06/06/11, 08/26/11    performed in IR  . Shuntogram N/A 05/29/2013    Procedure: Earney Mallet;  Surgeon: Conrad Dieterich, MD;  Location: [facility] CATH LAB;  Service: Cardiovascular;  Laterality: N/A;  . Shuntogram Left 03/01/2014    Procedure: SHUNTOGRAM;  Surgeon: Conrad Sisters, MD;  Location: [facility] CATH LAB;  Service: Cardiovascular;  Laterality: Left;  . Cardiac catheterization N/A 02/05/2015    Procedure: Left Heart Cath and Coronary Angiography;  Surgeon: Jettie Booze, MD;  Location: [place] CV LAB;  Service: Cardiovascular;  Laterality: N/A;  . Peripheral vascular catheterization N/A 02/06/2015    Procedure: A/V Shuntogram;  Surgeon: Elam Dutch, MD;  Location: [place] CV LAB;  Service: Cardiovascular;  Laterality: N/A;    Family History  Problem Relation Age of Onset  . Cardiomyopathy    . Heart disease Mother   . Diabetes Mother   . Hypertension Mother   . Heart attack Mother   . Diabetes Other   . Kidney disease Other   . Diabetes Daughter     . Diabetes Son   . Hypertension Son     Social History:  Lives alone and independent with use of walker. Has an aide 2-3 hours/7 days a week to help with  Meals/housework. She reports that she quit smoking about 31 years ago. She has never used smokeless tobacco. She reports that she does not drink alcohol or use illicit drugs.    Allergies: No Known Allergies    Medications Prior to Admission  Medication Sig Dispense Refill  . calcium acetate (ELIPHOS) 667 MG tablet Take 667 mg by mouth 3 (three) times daily with meals. Take 3 capsule in the morning, 2 capsules with a snack and 3 capsules in the evening.    . celecoxib (CELEBREX) 200 MG capsule Take 200 mg by mouth daily as needed.  1  . cinacalcet (SENSIPAR) 30 MG tablet Take 30 mg by mouth at bedtime.    . cinacalcet (SENSIPAR) 90 MG tablet Take 90 mg by mouth daily.    . folic acid-vitamin b complex-vitamin c-selenium-zinc (DIALYVITE) 3 MG TABS tablet Take 1 tablet by mouth daily.    Marland Kitchen gabapentin (NEURONTIN) 300 MG capsule Take 300 mg by mouth at bedtime.    . insulin aspart (NOVOLOG) 100 UNIT/ML injection Inject 14 Units into the skin 2 (two) times daily as needed for high blood sugar.    . latanoprost (XALATAN) 0.005 % ophthalmic solution Place 1 drop into the left eye at bedtime.    Marland Kitchen lisinopril (PRINIVIL,ZESTRIL) 20 MG tablet Take 20 mg by mouth daily.     Marland Kitchen lisinopril (PRINIVIL,ZESTRIL) 30 MG tablet Take 30 mg by mouth daily.    Marland Kitchen omeprazole (PRILOSEC) 40 MG capsule Take 40 mg by mouth daily.    . pravastatin (PRAVACHOL) 40 MG tablet Take 40 mg by mouth daily.     Marland Kitchen PROAIR HFA 108 (90 BASE) MCG/ACT inhaler Inhale 1 puff into the lungs every 4 (four) hours as needed for wheezing or shortness of breath.     . silver sulfADIAZINE (SILVADENE) 1 % cream APPLY A 1/16 INCH (1.5 MM) THICK LAYER TO ENTIRE BURN AREA BY TOPICAL ROUTE ONCE DAILY  0  . traMADol (ULTRAM) 50 MG tablet Take 50 mg by mouth every 6 (six) hours as needed for  moderate pain.    Marland Kitchen VOLTAREN 1 % GEL APPLY 2 GRAMS TO AREA UP TO 4 TIMES A DAY  2  . diphenoxylate-atropine (LOMOTIL) 2.5-0.025 MG per tablet   0  . ketoconazole (NIZORAL) 2 % cream Apply 1 application topically daily. 15 g 0  . levofloxacin (LEVAQUIN) 750 MG tablet   0  . ofloxacin (OCUFLOX) 0.3 % ophthalmic solution     . rOPINIRole (REQUIP) 0.5 MG tablet Take 0.5 mg by mouth at bedtime.  12    Home: Home Living Family/patient expects to be discharged to:: Private residence Living Arrangements: Alone Available Help at  Discharge: Personal care attendant (M-Sunday 2.5 every day except monday 3 hours) Type of Home: Apartment Home Access: Elevator Home Layout: One level Bathroom Shower/Tub: Multimedia programmer: Standard (3n1 over toilet) Home Equipment: Walker - 4 wheels, Shower seat Additional Comments: pt has an aide 7 days a week for 2.5 hours except monday she has 3 hours (A). Pt otherwise alone per patient  Functional History: Prior Function Level of Independence: Independent with assistive device(s) Functional Status:  Mobility: Bed Mobility Overal bed mobility: Needs Assistance Bed Mobility: Supine to Sit Supine to sit: Supervision General bed mobility comments: HOB elevated, cues to push from bed, required use of bed rail Transfers Overall transfer level: Needs assistance Equipment used: 4-wheeled walker Transfers: Sit to/from Stand, Stand Pivot Transfers Sit to Stand: Min assist General transfer comment: cues for hand and feet placement, required min assist to power up. Needed two trials to stand, second trial was successful when feet moved closer to bed. Pt required VCs to stand straight and lift head during stand pivot      ADL: ADL Overall ADL's : Needs assistance/impaired Eating/Feeding: NPO Grooming: Wash/dry face, Supervision/safety Lower Body Dressing: Moderate assistance, Sit to/from stand Lower Body Dressing Details (indicate cue type and  reason): pt able to doff socks but difficulty applying socks Toilet Transfer: Maximal assistance, RW, Ambulation Toilet Transfer Details (indicate cue type and reason): cues for hand placement and heavy use of grab bar. Pt unable to complete sit<>stand from low surface Toileting- Clothing Manipulation and Hygiene: Maximal assistance Functional mobility during ADLs: Moderate assistance General ADL Comments: Pt requires (A) with sit<>Stand and use of 4ww. pt currently with 4ww brakes lose and not locking.  Pt with DOE and requires rest break between transfers. Pt with HR 80s with movement  Cognition: Cognition Overall Cognitive Status: Within Functional Limits for tasks assessed Orientation Level: Oriented X4 Cognition Arousal/Alertness: Awake/alert Behavior During Therapy: WFL for tasks assessed/performed Overall Cognitive Status: Within Functional Limits for tasks assessed   Blood pressure 115/81, pulse 57, temperature 98.5 F (36.9 C), temperature source Oral, resp. rate 18, height 5\' 4"  (1.626 m), weight 79.379 kg (175 lb), SpO2 100 %. Physical Exam  Nursing note and vitals reviewed. Constitutional: She is oriented to person, place, and time. She appears well-developed and well-nourished.  Seen in HD unit--lying in bed receiving dialysis via left thigh AVG.   HENT:  Head: Normocephalic and atraumatic.  Mouth/Throat: Oropharynx is clear and moist.  Eyes: Conjunctivae are normal. Pupils are equal, round, and reactive to light. Right eye exhibits no discharge. Left eye exhibits no discharge.  Neck: Normal range of motion. Neck supple.  Cardiovascular: Normal rate and regular rhythm.   No murmur heard. Respiratory: Effort normal and breath sounds normal. No respiratory distress. She has no wheezes. She exhibits no tenderness.  GI: Soft. Bowel sounds are normal. She exhibits no distension. There is no tenderness.  Musculoskeletal: She exhibits edema (min edema left thigh). She exhibits  no tenderness.  mild pain with palpation left thigh or left calf. Moves BUE and RLE without difficulty. AVF Left groin  Neurological: She is alert and oriented to person, place, and time.  Speech clear and able to follow basic commands without difficulty.  Sensory deficits RLE? Does sense pain in foot/ankle area. UE: 4/5 prox to distal. LE: 2/5LLE with hf, LLE KE and ADF/APF 3/5. RLE: 3/5 hf, 4/5 ke and adf/apf.   Skin: Skin is warm and dry.  Psychiatric: She has a normal mood and  affect. Her behavior is normal. Thought content normal.    Results for orders placed or performed during the hospital encounter of 02/01/15 (from the past 24 hour(s))  Glucose, capillary     Status: Abnormal   Collection Time: 02/07/15 11:56 AM  Result Value Ref Range   Glucose-Capillary 182 (H) 65 - 99 mg/dL   Comment 1 Notify RN    Comment 2 Document in Chart   Heparin level (unfractionated)     Status: Abnormal   Collection Time: 02/07/15  3:55 PM  Result Value Ref Range   Heparin Unfractionated 0.28 (L) 0.30 - 0.70 IU/mL  Glucose, capillary     Status: Abnormal   Collection Time: 02/07/15  4:38 PM  Result Value Ref Range   Glucose-Capillary 252 (H) 65 - 99 mg/dL   Comment 1 Notify RN    Comment 2 Document in Chart   Glucose, capillary     Status: Abnormal   Collection Time: 02/07/15  8:52 PM  Result Value Ref Range   Glucose-Capillary 182 (H) 65 - 99 mg/dL  CBC     Status: Abnormal   Collection Time: 02/08/15  2:51 AM  Result Value Ref Range   WBC 5.2 4.0 - 10.5 K/uL   RBC 2.68 (L) 3.87 - 5.11 MIL/uL   Hemoglobin 8.5 (L) 12.0 - 15.0 g/dL   HCT 26.8 (L) 36.0 - 46.0 %   MCV 100.0 78.0 - 100.0 fL   MCH 31.7 26.0 - 34.0 pg   MCHC 31.7 30.0 - 36.0 g/dL   RDW 15.8 (H) 11.5 - 15.5 %   Platelets 144 (L) 150 - 400 K/uL  Heparin level (unfractionated)     Status: None   Collection Time: 02/08/15  2:51 AM  Result Value Ref Range   Heparin Unfractionated 0.40 0.30 - 0.70 IU/mL  Protime-INR     Status:  None   Collection Time: 02/08/15  2:51 AM  Result Value Ref Range   Prothrombin Time 14.8 11.6 - 15.2 seconds   INR 1.14 0.00 - 1.49  Glucose, capillary     Status: Abnormal   Collection Time: 02/08/15  7:27 AM  Result Value Ref Range   Glucose-Capillary 142 (H) 65 - 99 mg/dL   No results found.  Assessment/Plan: Diagnosis: weakness, balance deficits related to debility after multiple medical issues 1. Does the need for close, 24 hr/day medical supervision in concert with the patient's rehab needs make it unreasonable for this patient to be served in a less intensive setting? Yes 2. Co-Morbidities requiring supervision/potential complications: LLE DVT, pad, esrd on HD, htn 3. Due to bladder management, bowel management, safety, skin/wound care, disease management, medication administration, pain management and patient education, does the patient require 24 hr/day rehab nursing? Yes 4. Does the patient require coordinated care of a physician, rehab nurse, PT (1-2 hrs/day, 5 days/week) and OT (1-2 hrs/day, 5 days/week) to address physical and functional deficits in the context of the above medical diagnosis(es)? Yes Addressing deficits in the following areas: balance, endurance, locomotion, strength, transferring, bowel/bladder control, bathing, dressing, feeding, grooming, toileting and psychosocial support 5. Can the patient actively participate in an intensive therapy program of at least 3 hrs of therapy per day at least 5 days per week? Yes 6. The potential for patient to make measurable gains while on inpatient rehab is excellent 7. Anticipated functional outcomes upon discharge from inpatient rehab are modified independent  with PT, modified independent with OT, n/a with SLP. 8. Estimated rehab length of  stay to reach the above functional goals is: 10-14 days 9. Does the patient have adequate social supports and living environment to accommodate these discharge functional goals? Yes and  Potentially 10. Anticipated D/C setting: Home 11. Anticipated post D/C treatments: [place] therapy 12. Overall Rehab/Functional Prognosis: excellent  RECOMMENDATIONS: This patient's condition is appropriate for continued rehabilitative care in the following setting: CIR Patient has agreed to participate in recommended program. Yes Note that insurance prior authorization may be required for reimbursement for recommended care.  Comment: Rehab Admissions Coordinator to follow up.  Thanks,  Meredith Staggers, MD, Mellody Drown     02/08/2015

## 2015-02-08 NOTE — Progress Notes (Signed)
Pt arrived to unit per bed at 0903.  Report received from bedside RN.  Consent verified.  Lungs diminished A & O X 4 Generalized edema. Regular R&R  L thigh AVG accessed with two 15 gauge needles.  Pulsation of blood noted.  Flushed with normal saline.  Tx initiated at 0910 with goal of 3069mL and net 2.5L.

## 2015-02-08 NOTE — H&P (View-Only) (Signed)
Physical Medicine and Rehabilitation Admission H&P   CC: Weakness     HPI: [name] Cowsert is a 69 y.o. female with history fo HTN, DM type 2 with nephropathy, ESRD, CAD who was admitted from [facility] on 02/01/15 with SOB, left leg discomfort with edema and Left proximal femoral DVT. CTA lungs negative for PE and she was started on IV heparin. Dr. Cathie Olden consulted for input due to progressive SOB limiting activity. Myoview done showing high risk with mixed infarct and ischemia therefore cath recommended for workup. Cardiac cath showed left ventricular EF < 25% with global hypokinesis and 100% stenosis 1st Diag lesion, 100% stenosed. Mid RCA lesion, 100% stenosed. faint left to right collaterals are present. To be treated medically per Dr. Burt Knack. Patient with history of left thigh AVG and Dr. Scot Dock consulted for input. 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 Oneida Alar on 11/23. PT evaluation done post procedure and CIR recommended due to deconditioned state, decreased balance, poor safety and inability to walk.    Review of Systems  HENT: Negative for hearing loss.   Eyes: Negative for blurred vision and double vision.  Respiratory: Positive for shortness of breath. Negative for cough.   Cardiovascular: Positive for claudication. Negative for chest pain and palpitations.  Gastrointestinal: Negative for heartburn, nausea and abdominal pain.  Genitourinary:       Does not produce urine  Musculoskeletal: Negative for myalgias, back pain and neck pain.  Neurological: Positive for weakness. Negative for dizziness, tingling, focal weakness and headaches.  Psychiatric/Behavioral: Negative for memory loss. The patient does not have insomnia.       Past Medical History  Diagnosis Date  . Hypertension   . Hyperlipidemia   . Diabetes mellitus   . CVA (cerebral vascular accident)  ([place]) [place]    left sided weakness  . Protein malnutrition ([place])   . Secondary hyperparathyroidism ([place])   . Anemia   . End stage renal disease ([place])     initiated HD 2006  . Hx of echocardiogram     a. Echo 03/2010: EF 55-60%, Gr 1 diast dysfn, trivial MS, mild to mod LAE, PASP 34, ;  b. Echo 11/13: mild LVH, EF 60%, Gr 1 diast dysfn, Ao sclerosis, no AS, MAC, slight MS, mean 3 mmHg, PASP 34  . Hx of cardiovascular stress test     a. MV 03/2010:  no ischemia, EF 55%  . CHF (congestive heart failure) ([place])   . Retinal detachment, bilateral   . CAD (coronary artery disease)     Past Surgical History  Procedure Laterality Date  . Cataract extraction    . Tubal ligation    . Arteriovenous graft placement      BUE numerous times  . Arteriovenous graft placement  11/13/10    Lt femoral - Dr. Kellie Simmering  . Dg av dialysis graft declot or  06/06/11, 08/26/11    performed in IR  . Shuntogram N/A 05/29/2013    Procedure: Earney Mallet;  Surgeon: Conrad [place], MD;  Location: [facility] C[facility] LAB;  Service: Cardiovascular;  Laterality: N/A;  . Shuntogram Left 03/01/2014    Procedure: SHUNTOGRAM;  Surgeon: Conrad [place], MD;  Location: [facility] CATH LAB;  Service: Cardiovascular;  Laterality: Left;  . Cardiac catheterization N/A 02/05/2015    Procedure: Left Heart Cath and Coronary Angiography;  Surgeon: Jettie Booze, MD;  Location: [place] CV  LAB;  Service: Cardiovascular;  Laterality: N/A;  . Peripheral vascular catheterization N/A 02/06/2015    Procedure: A/V Shuntogram;  Surgeon: Elam Dutch, MD;  Location: [place] CV LAB;  Service: Cardiovascular;  Laterality: N/A;    Family History  Problem Relation Age of Onset  . Cardiomyopathy    . Heart disease Mother   . Diabetes Mother   . Hypertension Mother   . Heart attack Mother   . Diabetes Other   . Kidney disease Other   . Diabetes Daughter   . Diabetes Son   . Hypertension Son     Social History:  Lives alone and independent with  use of walker. Has an aide 2-3 hours/7 days a week to help with Meals/housework. She reports that she quit smoking about 31 years ago. She has never used smokeless tobacco. She reports that she does not drink alcohol or use illicit drugs.    Allergies: No Known Allergies    Medications Prior to Admission  Medication Sig Dispense Refill  . calcium acetate (ELIPHOS) 667 MG tablet Take 667 mg by mouth 3 (three) times daily with meals. Take 3 capsule in the morning, 2 capsules with a snack and 3 capsules in the evening.    . celecoxib (CELEBREX) 200 MG capsule Take 200 mg by mouth daily as needed.  1  . cinacalcet (SENSIPAR) 30 MG tablet Take 30 mg by mouth at bedtime.    . cinacalcet (SENSIPAR) 90 MG tablet Take 90 mg by mouth daily.    . folic acid-vitamin b complex-vitamin c-selenium-zinc (DIALYVITE) 3 MG TABS tablet Take 1 tablet by mouth daily.    Marland Kitchen gabapentin (NEURONTIN) 300 MG capsule Take 300 mg by mouth at bedtime.    . insulin aspart (NOVOLOG) 100 UNIT/ML injection Inject 14 Units into the skin 2 (two) times daily as needed for high blood sugar.    . latanoprost (XALATAN) 0.005 % ophthalmic solution Place 1 drop into the left eye at bedtime.    Marland Kitchen lisinopril (PRINIVIL,ZESTRIL) 20 MG tablet Take 20 mg by mouth daily.     Marland Kitchen lisinopril (PRINIVIL,ZESTRIL) 30 MG tablet Take 30 mg by mouth daily.    Marland Kitchen omeprazole (PRILOSEC) 40 MG capsule Take 40 mg by mouth daily.    . pravastatin (PRAVACHOL) 40 MG tablet Take 40 mg by mouth daily.     Marland Kitchen PROAIR HFA 108 (90 BASE) MCG/ACT inhaler Inhale 1 puff into the lungs every 4 (four) hours as needed for wheezing or shortness of breath.     . silver sulfADIAZINE (SILVADENE) 1 % cream APPLY A 1/16 INCH (1.5 MM) THICK LAYER TO ENTIRE BURN AREA BY TOPICAL ROUTE ONCE DAILY  0  . traMADol (ULTRAM) 50 MG tablet Take 50 mg by mouth every 6 (six) hours as needed for moderate pain.    Marland Kitchen VOLTAREN 1 % GEL APPLY 2 GRAMS TO AREA UP TO 4 TIMES A DAY  2  .  diphenoxylate-atropine (LOMOTIL) 2.5-0.025 MG per tablet   0  . ketoconazole (NIZORAL) 2 % cream Apply 1 application topically daily. 15 g 0  . levofloxacin (LEVAQUIN) 750 MG tablet   0  . ofloxacin (OCUFLOX) 0.3 % ophthalmic solution     . rOPINIRole (REQUIP) 0.5 MG tablet Take 0.5 mg by mouth at bedtime.  12    Home: Home Living Family/patient expects to be discharged to:: Private residence Living Arrangements: Alone Available Help at Discharge: Personal care attendant (M-Sunday 2.5 every day except monday 3 hours)  Type of Home: Apartment Home Access: Elevator Home Layout: One level Bathroom Shower/Tub: Multimedia programmer: Standard (3n1 over toilet) Home Equipment: Walker - 4 wheels, Shower seat Additional Comments: pt has an aide 7 days a week for 2.5 hours except monday she has 3 hours (A). Pt otherwise alone per patient   Functional History: Prior Function Level of Independence: Independent with assistive device(s)  Functional Status:  Mobility: Bed Mobility Overal bed mobility: Needs Assistance Bed Mobility: Supine to Sit Supine to sit: Supervision General bed mobility comments: HOB elevated, cues to push from bed, required use of bed rail Transfers Overall transfer level: Needs assistance Equipment used: 4-wheeled walker Transfers: Sit to/from Stand, Stand Pivot Transfers Sit to Stand: Min assist General transfer comment: cues for hand and feet placement, required min assist to power up. Needed two trials to stand, second trial was successful when feet moved closer to bed. Pt required VCs to stand straight and lift head during stand pivot      ADL: ADL Overall ADL's : Needs assistance/impaired Eating/Feeding: NPO Grooming: Wash/dry face, Supervision/safety Lower Body Dressing: Moderate assistance, Sit to/from stand Lower Body Dressing Details (indicate cue type and reason): pt able to doff socks but difficulty applying socks Toilet Transfer: Maximal  assistance, RW, Ambulation Toilet Transfer Details (indicate cue type and reason): cues for hand placement and heavy use of grab bar. Pt unable to complete sit<>stand from low surface Toileting- Clothing Manipulation and Hygiene: Maximal assistance Functional mobility during ADLs: Moderate assistance General ADL Comments: Pt requires (A) with sit<>Stand and use of 4ww. pt currently with 4ww brakes lose and not locking.  Pt with DOE and requires rest break between transfers. Pt with HR 80s with movement  Cognition: Cognition Overall Cognitive Status: Within Functional Limits for tasks assessed Orientation Level: Oriented X4 Cognition Arousal/Alertness: Awake/alert Behavior During Therapy: WFL for tasks assessed/performed Overall Cognitive Status: Within Functional Limits for tasks assessed    Blood pressure 135/60, pulse 56, temperature 98.6 F (37 C), temperature source Oral, resp. rate 21, height _0  (1.626 m), weight 85.1 kg (187 lb 9.8 oz), SpO2 100 %. Physical Exam  Nursing note and vitals reviewed. Constitutional: She is oriented to person, place, and time. She appears well-developed and well-nourished.  HENT:  Head: Normocephalic and atraumatic.  Mouth/Throat: Oropharynx is clear and moist.  Eyes: Conjunctivae are normal. Pupils are equal, round, and reactive to light.  Neck: Normal range of motion. Neck supple.  Cardiovascular: Normal rate and regular rhythm.   No murmur heard. Respiratory: Effort normal and breath sounds normal. No respiratory distress. She has no wheezes.  GI: Soft. She exhibits no distension. There is no tenderness.  Musculoskeletal: She exhibits edema (min edema left thigh). She exhibits no tenderness.  AVG in left groin. No pain with palpation of LLE. Has some decreased sensation RLE.   Neurological: She is alert and oriented to person, place, and time.  Speech clear and able to follow basic commands without difficulty. Sensory deficits RLE? Does  sense pain in foot/ankle area. UE: 4/5 prox to distal. LE: 2/5LLE with hf, LLE KE and ADF/APF 3/5. RLE: 3/5 hf, 4/5 ke and adf/apf  Skin: Skin is warm and dry. No rash noted. No erythema.  Psychiatric: She has a normal mood and affect. Her behavior is normal. Thought content normal.    Results for orders placed or performed during the hospital encounter of 02/01/15 (from the past 48 hour(s))  Glucose, capillary     Status: Abnormal  Collection Time: 02/06/15 12:53 PM  Result Value Ref Range   Glucose-Capillary 130 (H) 65 - 99 mg/dL  Glucose, capillary     Status: Abnormal   Collection Time: 02/06/15  5:20 PM  Result Value Ref Range   Glucose-Capillary 253 (H) 65 - 99 mg/dL  Glucose, capillary     Status: Abnormal   Collection Time: 02/06/15  9:11 PM  Result Value Ref Range   Glucose-Capillary 161 (H) 65 - 99 mg/dL   Comment 1 Notify RN    Comment 2 Document in Chart   CBC     Status: Abnormal   Collection Time: 02/07/15  3:51 AM  Result Value Ref Range   WBC 4.8 4.0 - 10.5 K/uL   RBC 2.78 (L) 3.87 - 5.11 MIL/uL   Hemoglobin 8.8 (L) 12.0 - 15.0 g/dL   HCT 27.7 (L) 36.0 - 46.0 %   MCV 99.6 78.0 - 100.0 fL   MCH 31.7 26.0 - 34.0 pg   MCHC 31.8 30.0 - 36.0 g/dL   RDW 15.3 11.5 - 15.5 %   Platelets 136 (L) 150 - 400 K/uL  Glucose, capillary     Status: Abnormal   Collection Time: 02/07/15  6:37 AM  Result Value Ref Range   Glucose-Capillary 164 (H) 65 - 99 mg/dL   Comment 1 Notify RN    Comment 2 Document in Chart   Glucose, capillary     Status: Abnormal   Collection Time: 02/07/15 11:56 AM  Result Value Ref Range   Glucose-Capillary 182 (H) 65 - 99 mg/dL   Comment 1 Notify RN    Comment 2 Document in Chart   Heparin level (unfractionated)     Status: Abnormal   Collection Time: 02/07/15  3:55 PM  Result Value Ref Range   Heparin Unfractionated 0.28 (L) 0.30 - 0.70 IU/mL    Comment:        IF HEPARIN RESULTS ARE BELOW EXPECTED VALUES, AND PATIENT DOSAGE HAS BEEN  CONFIRMED, SUGGEST FOLLOW UP TESTING OF ANTITHROMBIN III LEVELS.   Glucose, capillary     Status: Abnormal   Collection Time: 02/07/15  4:38 PM  Result Value Ref Range   Glucose-Capillary 252 (H) 65 - 99 mg/dL   Comment 1 Notify RN    Comment 2 Document in Chart   Glucose, capillary     Status: Abnormal   Collection Time: 02/07/15  8:52 PM  Result Value Ref Range   Glucose-Capillary 182 (H) 65 - 99 mg/dL  CBC     Status: Abnormal   Collection Time: 02/08/15  2:51 AM  Result Value Ref Range   WBC 5.2 4.0 - 10.5 K/uL   RBC 2.68 (L) 3.87 - 5.11 MIL/uL   Hemoglobin 8.5 (L) 12.0 - 15.0 g/dL   HCT 26.8 (L) 36.0 - 46.0 %   MCV 100.0 78.0 - 100.0 fL   MCH 31.7 26.0 - 34.0 pg   MCHC 31.7 30.0 - 36.0 g/dL   RDW 15.8 (H) 11.5 - 15.5 %   Platelets 144 (L) 150 - 400 K/uL  Heparin level (unfractionated)     Status: None   Collection Time: 02/08/15  2:51 AM  Result Value Ref Range   Heparin Unfractionated 0.40 0.30 - 0.70 IU/mL    Comment:        IF HEPARIN RESULTS ARE BELOW EXPECTED VALUES, AND PATIENT DOSAGE HAS BEEN CONFIRMED, SUGGEST FOLLOW UP TESTING OF ANTITHROMBIN III LEVELS.   Protime-INR     Status: None  Collection Time: 02/08/15  2:51 AM  Result Value Ref Range   Prothrombin Time 14.8 11.6 - 15.2 seconds   INR 1.14 0.00 - 1.49  Glucose, capillary     Status: Abnormal   Collection Time: 02/08/15  7:27 AM  Result Value Ref Range   Glucose-Capillary 142 (H) 65 - 99 mg/dL  Renal function panel     Status: Abnormal   Collection Time: 02/08/15  9:00 AM  Result Value Ref Range   Sodium 133 (L) 135 - 145 mmol/L   Potassium 4.6 3.5 - 5.1 mmol/L   Chloride 97 (L) 101 - 111 mmol/L   CO2 27 22 - 32 mmol/L   Glucose, Bld 179 (H) 65 - 99 mg/dL   BUN 50 (H) 6 - 20 mg/dL   Creatinine, Ser 9.45 (H) 0.44 - 1.00 mg/dL   Calcium 9.7 8.9 - 10.3 mg/dL   Phosphorus 3.6 2.5 - 4.6 mg/dL   Albumin 2.9 (L) 3.5 - 5.0 g/dL   GFR calc non Af Amer 4 (L) >60 mL/min   GFR calc Af Amer 4 (L)  >60 mL/min    Comment: (NOTE) The eGFR has been calculated using the CKD EPI equation. This calculation has not been validated in all clinical situations. eGFR's persistently <60 mL/min signify possible Chronic Kidney Disease.    Anion gap 9 5 - 15   No results found.     Medical Problem List and Plan: 1. Functional deficits secondary to Debility with weakness and balance deficits  2.  LLE DVT Prophylaxis/Anticoagulation: Pharmaceutical: Coumadin 3. Pain Management: On Neurontin at nights for neuropathy? Continue tylenol prn 4. Mood: LCSW to follow up for evaluation and support.  5. Neuropsych: This patient is capable of making decisions on her own behalf. 6. Skin/Wound Care: Routine pressure relief measures 7. Fluids/Electrolytes/Nutrition: Continue 1200 cc FR daily.   8. CAD: Treat medically and monitor for symptoms/tolerance with increase in activity. 9.  HTN: monitor BP bid on lisinopril and coreg.  10. ESRD:  Nephrology to follow for assistance. Continue HD MWF in afternoons to help with tolerance of therapy during the day. 11. DM type 2: Used novolog bid prn at home. Continue to monitor BS ac/hs and use SSI for elevated BS.  12. Anemia of chronic disease: On Aranesp. Monitor and transfuse prn symptoms or Hgb less than 7.0 13. Thrombocytopenia: Monitor closely with heparin on board. Monitor for signs of bleeding.  14. PVD:  On plavix due to multiple stents left iliac vein.    Post Admission Physician Evaluation: 1. Weakness, balance deficits related to debility after multiple medical issues 2. Patient is admitted to receive collaborative, interdisciplinary care between the physiatrist, rehab nursing staff, and therapy team. 3. Patient's level of medical complexity and substantial therapy needs in context of that medical necessity cannot be provided at a lesser intensity of care such as a SNF. 4. Patient has experienced substantial functional loss from his/her baseline which  was documented above under the "Functional History" and "Functional Status" headings.  Judging by the patient's diagnosis, physical exam, and functional history, the patient has potential for functional progress which will result in measurable gains while on inpatient rehab.  These gains will be of substantial and practical use upon discharge  in facilitating mobility and self-care at the household level. 5. Physiatrist will provide 24 hour management of medical needs as well as oversight of the therapy plan/treatment and provide guidance as appropriate regarding the interaction of the two. 6. 24 hour rehab  nursing will assist with bladder management, bowel management, safety, skin/wound care, disease management, medication administration, pain management and patient education  and help integrate therapy concepts, techniques,education, etc. 7. PT will assess and treat for/with: Lower extremity strength, range of motion, stamina, balance, functional mobility, safety, adaptive techniques and equipment, pain mgt, exercise capacity, community reintegration, transfers/mobility after HD.   Goals are: mod I. 8. OT will assess and treat for/with: ADL's, functional mobility, safety, upper extremity strength, adaptive techniques and equipment, pain mgt, balance, ego support, community reintegration.   Goals are: mod I. Therapy may proceed with showering this patient. 9. SLP will assess and treat for/with: n/a.  Goals are: n/a. 10. Case Management and Social Worker will assess and treat for psychological issues and discharge planning. 11. Team conference will be held weekly to assess progress toward goals and to determine barriers to discharge. 12. Patient will receive at least 3 hours of therapy per day at least 5 days per week. 13. ELOS: 10-14 days       14. Prognosis:  excellent     Meredith Staggers, MD, [facility] 02/08/2015   02/08/2015

## 2015-02-08 NOTE — Progress Notes (Signed)
Pt tolerated tx well.  Report called to bedside RN.    Needles pulled and pressure held to thigh graft for 15 minutes.   Assessment unchanged.  2598mL ultrafiltrated with 2L net.

## 2015-02-08 NOTE — Discharge Summary (Signed)
Physician Discharge Summary   Patient ID: Stefanie [name] MRN: [id number] DOB/AGE: 04-22-1945 69 y.o.  Admit date: 02/01/2015 Discharge date: 02/08/2015  Primary Care Physician:  Bonnita Nasuti, MD  Discharge Diagnoses:    . DVT (deep venous thrombosis) ([place]) . Type 2 diabetes, controlled, with neuropathy ([place]) . Secondary renal hyperparathyroidism ([place]) . PAD (peripheral artery disease) ([place]) . Essential hypertension . HLD (hyperlipidemia) . Cerebral artery occlusion with cerebral infarction ([place]) . ANEMIA . Leukocytosis . Elevated troponin   Consults:   Nephrology  cardiology Vascular surgery  Recommendations for  Follow-up:  1. Please continue heparin bridging with Coumadin until INR above 2 2. PT/INR daily 3. Hemodialysis per nephrology 4. Please stop Plavix once she is therapeutic on Coumadin    TESTS THAT NEED FOLLOW-UP PT/INR daily   DIET: Carb modified diet    Allergies:  No Known Allergies   DISCHARGE MEDICATIONS: Current Discharge Medication List    CONTINUE these medications which have NOT CHANGED   Details  calcium acetate (ELIPHOS) 667 MG tablet Take 667 mg by mouth 3 (three) times daily with meals. Take 3 capsule in the morning, 2 capsules with a snack and 3 capsules in the evening.    celecoxib (CELEBREX) 200 MG capsule Take 200 mg by mouth daily as needed. Refills: 1    !! cinacalcet (SENSIPAR) 30 MG tablet Take 30 mg by mouth at bedtime.    !! cinacalcet (SENSIPAR) 90 MG tablet Take 90 mg by mouth daily.    folic acid-vitamin b complex-vitamin c-selenium-zinc (DIALYVITE) 3 MG TABS tablet Take 1 tablet by mouth daily.    gabapentin (NEURONTIN) 300 MG capsule Take 300 mg by mouth at bedtime.    insulin aspart (NOVOLOG) 100 UNIT/ML injection Inject 14 Units into the skin 2 (two) times daily as needed for high blood sugar.    latanoprost (XALATAN) 0.005 % ophthalmic solution Place 1 drop into the left eye at bedtime.    !!  lisinopril (PRINIVIL,ZESTRIL) 20 MG tablet Take 20 mg by mouth daily.     !! lisinopril (PRINIVIL,ZESTRIL) 30 MG tablet Take 30 mg by mouth daily.    omeprazole (PRILOSEC) 40 MG capsule Take 40 mg by mouth daily.    pravastatin (PRAVACHOL) 40 MG tablet Take 40 mg by mouth daily.     PROAIR HFA 108 (90 BASE) MCG/ACT inhaler Inhale 1 puff into the lungs every 4 (four) hours as needed for wheezing or shortness of breath.     silver sulfADIAZINE (SILVADENE) 1 % cream APPLY A 1/16 INCH (1.5 MM) THICK LAYER TO ENTIRE BURN AREA BY TOPICAL ROUTE ONCE DAILY Refills: 0    traMADol (ULTRAM) 50 MG tablet Take 50 mg by mouth every 6 (six) hours as needed for moderate pain.    VOLTAREN 1 % GEL APPLY 2 GRAMS TO AREA UP TO 4 TIMES A DAY Refills: 2    diphenoxylate-atropine (LOMOTIL) 2.5-0.025 MG per tablet Refills: 0    ketoconazole (NIZORAL) 2 % cream Apply 1 application topically daily. Qty: 15 g, Refills: 0    ofloxacin (OCUFLOX) 0.3 % ophthalmic solution     rOPINIRole (REQUIP) 0.5 MG tablet Take 0.5 mg by mouth at bedtime. Refills: 12     !! - Potential duplicate medications found. Please discuss with provider.    STOP taking these medications     calcium acetate (PHOSLO) 667 MG capsule      levofloxacin (LEVAQUIN) 750 MG tablet      clopidogrel (PLAVIX) 75 MG tablet  Inpatient medications to be continued at rehabilitation Heparin drip per pharmacy until therapeutic Coumadin per pharmacy   Brief H and P: For complete details please refer to admission H and P, but in brief Patient is a 69 year old female with ESRD on HD, TTS, CVA, CHF, CAD, hypertension, hyperlipidemia, diabetes presented with left leg pain and shortness of breath. Patient was transferred from [facility] due to lack of dialysis center. In [facility] ER, patient was found to have left common femoral vein DVT, CT angiogram of the chest was negative for PE. Patient was admitted. Nephrology and  vascular surgery were consulted.  Hospital Course:   Principal Problem: 1-Left femoral vein DVT:  - Patient was placed on heparin drip, vascular surgery was consulted. - per VVS and following normal fistulogram, no needs for thrombolysis  - Patient underwent left thigh shuntogram with angioplasty of the venous outflow stenosis today - Reviewed nephrology and cardiology recommendations, patient to start on Coumadin, plan to stop Plavix once she is therapeutic on Coumadin.  - Discussed with vascular surgery, Dr. Oneida Alar 11/23, Coumadin started on 11/24, continue heparin drip until INR therapeutic   2-ESRD: renal on board - will continue HD T-T-S, nephrology following - Per vascular surgery, Dr Oneida Alar, patient's left thigh graft is ready to use and would be unable to further percutaneous intervention necessary. Most likely the patient will not be a candidate for any venous operative revision and will only be a candidate for percutaneous procedures due to the fact that she has stents extending from the common femoral vein all the way up to the cava.  3-elevated troponin and DOE: cardiology consulted -2-D echo showed severe global hypokinesis and reduce EF -will continue lisinopril and b-blocker -Myoview abnormal and high risk; patient underwent cardiac cath on 11/22 which showed left ventricular EF less than 25%, global hypokinesis of the left ventricle, mid RCA lesion 100% stenosis, first diagonal lesion 100 % stenosis  4-anemia of chronic disease:  -ESA and IV iron as per renal discretion -Hgb stable  5-HLD: continue statins  6-Hx of cerebral artery occlusion with cerebral infarction [facility]): no acute new issues. History of stroke in 1985 and 1990 -Currently on Plavix. Patient started on warfarin, plan to discontinue Plavix after she is therapeutic with INR.  7-DM-II: Last A1c not on record, Patient is taking 75/25 mix insulin at home -continue SSI while inpatient   8-acute on  chronic L ventricular dysfunction -EF 30-35%, volume management with the hemodialysis, continue ACE inhibitor, beta blocker Cardiology following  Generalized debility PTOT evaluation recommended inpatient rehabilitation, patient accepted   Day of Discharge BP 126/51 mmHg  Pulse 55  Temp(Src) 98.6 F (37 C) (Oral)  Resp 21  Ht 5\' 4"  (1.626 m)  Wt 85.1 kg (187 lb 9.8 oz)  BMI 32.19 kg/m2  SpO2 100%  Physical Exam: General: Alert and awake oriented x3 not in any acute distress. HEENT: anicteric sclera, pupils reactive to light and accommodation CVS: S1-S2 clear no murmur rubs or gallops Chest: clear to auscultation bilaterally, no wheezing rales or rhonchi Abdomen: soft nontender, nondistended, normal bowel sounds Extremities: no cyanosis, clubbing, 1+ edema noted bilaterally Neuro: Cranial nerves II-XII intact, no focal neurological deficits   The results of significant diagnostics from this hospitalization (including imaging, microbiology, ancillary and laboratory) are listed below for reference.    LAB RESULTS: Basic Metabolic Panel:  Recent Labs Lab 02/06/15 0453 02/08/15 0900  NA 137 133*  K 4.5 4.6  CL 99* 97*  CO2 29  27  GLUCOSE 201* 179*  BUN 24* 50*  CREATININE 5.93* 9.45*  CALCIUM 9.3 9.7  PHOS  --  3.6   Liver Function Tests:  Recent Labs Lab 02/08/15 0900  ALBUMIN 2.9*   No results for input(s): LIPASE, AMYLASE in the last 168 hours. No results for input(s): AMMONIA in the last 168 hours. CBC:  Recent Labs Lab 02/07/15 0351 02/08/15 0251  WBC 4.8 5.2  HGB 8.8* 8.5*  HCT 27.7* 26.8*  MCV 99.6 100.0  PLT 136* 144*   Cardiac Enzymes:  Recent Labs Lab 02/02/15 0510 02/02/15 1113  TROPONINI 0.11* 0.16*   BNP: Invalid input(s): POCBNP CBG:  Recent Labs Lab 02/07/15 2052 02/08/15 0727  GLUCAP 182* 142*    Significant Diagnostic Studies:  Dg Chest 2 View  02/02/2015  CLINICAL DATA:  Shortness of breath, cough, and weakness  for 2 days. EXAM: CHEST  2 VIEW COMPARISON:  02/01/2015 FINDINGS: Moderate cardiomegaly and diffuse pulmonary vascular congestion remains stable. No evidence of acute infiltrate or pleural effusion. Short segment of wire again seen overlying the right clavicle. Endovascular stent seen in left axillary region. IMPRESSION: Stable cardiomegaly and pulmonary vascular congestion. No acute findings. Electronically Signed   By: Earle Gell M.D.   On: 02/02/2015 00:49    2D ECHO: Study Conclusions  - Left ventricle: The cavity size was mildly dilated. Wall thickness was increased in a pattern of mild LVH. Systolic function was moderately to severely reduced. The estimated ejection fraction was in the range of 30% to 35%. Diffuse hypokinesis. Features are consistent with a pseudonormal left ventricular filling pattern, with concomitant abnormal relaxation and increased filling pressure (grade 2 diastolic dysfunction). Doppler parameters are consistent with high ventricular filling pressure. - Aortic valve: There was trivial regurgitation. - Mitral valve: Calcified annulus. Mildly thickened leaflets . There was mild regurgitation. - Left atrium: The atrium was severely dilated. - Right ventricle: The cavity size was mildly dilated. Systolic function was moderately reduced. - Right atrium: The atrium was mildly dilated. - Pulmonary arteries: Systolic pressure was moderately increased. PA peak pressure: 55 mm Hg (S).  Disposition and Follow-up:    DISPOSITION: Inpatient rehabilitation   DISCHARGE FOLLOW-UP Follow-up Information    Follow up with Nahser, Wonda Cheng, MD On 02/21/2015.   Specialty:  Cardiology   Why:  3:00 PM   Contact information:   [place] [address] [place] [address] [phone]       Follow up with [name], [name], MD. Schedule an appointment as soon as possible for a visit in 2 weeks.   Specialty:  Internal Medicine   Why:  for  hospital follow-up   Contact information:   [address] [place] [address] [phone]        Time spent on Discharge: 35 minutes  Signed:   RAI,RIPUDEEP M.D. [facility] 02/08/2015, 12:11 PM Pager: [phone]

## 2015-02-08 NOTE — Progress Notes (Signed)
ANTICOAGULATION CONSULT NOTE - Follow Up Consult  Pharmacy Consult for heparin and Coumadin Indication: DVT  No Known Allergies  Patient Measurements: Height: 5\' 4"  (162.6 cm) Weight: 187 lb 9.8 oz (85.1 kg) IBW/kg (Calculated) : 54.7 Heparin Dosing Weight: 70 kg  Vital Signs: Temp: 98.6 F (37 C) (11/25 0903) Temp Source: Oral (11/25 0903) BP: 118/55 mmHg (11/25 1046) Pulse Rate: 54 (11/25 1046)  Labs:  Recent Labs  02/06/15 0453 02/07/15 0351 02/07/15 1555 02/08/15 0251 02/08/15 0900  HGB 9.2* 8.8*  --  8.5*  --   HCT 28.5* 27.7*  --  26.8*  --   PLT 125* 136*  --  144*  --   LABPROT  --   --   --  14.8  --   INR  --   --   --  1.14  --   HEPARINUNFRC  --   --  0.28* 0.40  --   CREATININE 5.93*  --   --   --  9.45*    Estimated Creatinine Clearance: 5.9 mL/min (by C-G formula based on Cr of 9.45).  Assessment: 69 year old female admitted 02/01/2015 from [facility] found to have common femoral vein DVT.  Patient to resume heparin and start Coumadin 02/07/2015 after having left thigh shuntogram w/ angioplasty 02/06/2015.  Heparin is therapeutic today. INR 1.14.   Goal of Therapy:  INR 2-3 Heparin level 0.3-0.7 units/ml Monitor platelets by anticoagulation protocol: Yes   Plan:   Cont heparin to 1200 units/hr Check HL confirmation  Coumadin 5mg  PO x1 Per renal, stop Plavix when INR therapeutic Daily heparin level, INR, CBC Monitor for s/sx of bleeding  Onnie Boer, PharmD Pager: [phone] 02/08/2015 10:55 AM

## 2015-02-08 NOTE — Progress Notes (Signed)
Physical Therapy Treatment Patient Details Name: Stefanie [name] MRN: [id number] DOB: [birth year] Today's Date: 02/08/2015    History of Present Illness 69 yo female admitted with DVT, s/p L heart cath pending procedure 02/06/15 with Dr fields. PMH: HTN, ESRD, DMII, SOB PAD,     PT Comments    Progressing steadily.  Emphasis on strengthening, transitions and gait with rollator.  Follow Up Recommendations  CIR     Equipment Recommendations       Recommendations for Other Services Rehab consult     Precautions / Restrictions Precautions Precautions: Fall Restrictions Weight Bearing Restrictions: No    Mobility  Bed Mobility Overal bed mobility: Needs Assistance Bed Mobility: Supine to Sit     Supine to sit: Supervision     General bed mobility comments: HOB elevated, cues to push from bed, required use of bed rail  Transfers Overall transfer level: Needs assistance Equipment used: 4-wheeled walker Transfers: Sit to/from Stand Sit to Stand: Min assist         General transfer comment: cues for prep to stand  Ambulation/Gait Ambulation/Gait assistance: Min assist Ambulation Distance (Feet): 65 Feet Assistive device: 4-wheeled walker Gait Pattern/deviations: Step-through pattern Gait velocity: slower Gait velocity interpretation: Below normal speed for age/gender General Gait Details: stable paretic gait with limitations bil, mostly due to stiff swing and foot flat bil and little dissociation from upper and lower trunk   Stairs            Wheelchair Mobility    Modified Rankin (Stroke Patients Only)       Balance Overall balance assessment: Needs assistance Sitting-balance support: No upper extremity supported Sitting balance-Leahy Scale: Fair       Standing balance-Leahy Scale: Fair Standing balance comment: but generally reliant on the RW or her UE's for support                    Cognition Arousal/Alertness:  Awake/alert Behavior During Therapy: WFL for tasks assessed/performed Overall Cognitive Status: Within Functional Limits for tasks assessed                      Exercises General Exercises - Lower Extremity Ankle Circles/Pumps: AROM;20 reps Heel Slides: AROM;Strengthening;Both;10 reps;Supine (graded resistance) Hip ABduction/ADduction: AROM;AAROM;Strengthening;Both;20 reps;Supine Straight Leg Raises: AROM;Strengthening;Both;10 reps;Supine Other Exercises Other Exercises: bil bicep/tricep presses with graded resistance.    General Comments        Pertinent Vitals/Pain Pain Assessment: No/denies pain    Home Living                      Prior Function            PT Goals (current goals can now be found in the care plan section) Acute Rehab PT Goals Patient Stated Goal: Pt wanted rehab before returning home PT Goal Formulation: With patient Time For Goal Achievement: 02/13/15 Potential to Achieve Goals: Good Progress towards PT goals: Progressing toward goals    Frequency  Min 3X/week    PT Plan Current plan remains appropriate    Co-evaluation             End of Session   Activity Tolerance: Patient tolerated treatment well Patient left: in bed;with call bell/phone within reach     Time: [id number] PT Time Calculation (min) (ACUTE ONLY): 25 min  Charges:  $Gait Training: 8-22 mins $Therapeutic Exercise: 8-22 mins  G Codes:      Harley Fitzwater, Tessie Fass 02/08/2015, 2:31 PM 02/08/2015  Donnella Sham, [place] [phone]  (pager)

## 2015-02-08 NOTE — Care Management Note (Signed)
Case Management Note  Patient Details  Name: Stefanie [name] MRN: [id number] Date of Birth: 1945-08-28  Subjective/Objective:  Pt admitted for DVT- in HD and the plan will be to d/c to Inpatient Rehab 02-08-15                  Action/Plan: No further needs from CM at this time.    Expected Discharge Date:  02/04/15               Expected Discharge Plan:  [place]  In-House Referral:  NA  Discharge planning Services  CM Consult  Post Acute Care Choice:  NA Choice offered to:  NA  DME Arranged:  N/A DME Agency:  NA  HH Arranged:  NA HH Agency:  NA  Status of Service:  Completed, signed off  Medicare Important Message Given:  Yes Date Medicare IM Given:    Medicare IM give by:    Date Additional Medicare IM Given:    Additional Medicare Important Message give by:     If discussed at [place] of Stay Meetings, dates discussed:    Additional Comments:  Bethena Roys, RN 02/08/2015, 11:33 AM

## 2015-02-08 NOTE — Progress Notes (Signed)
Rehab admissions - Evaluated for possible admission.  I spoke with Dr. Tana Coast who says patient can admit to acute inpatient rehab later today after HD.  Bed available and will admit to acute inpatient rehab today.  Call me for questions.  [phone]

## 2015-02-09 ENCOUNTER — Inpatient Hospital Stay (HOSPITAL_COMMUNITY): Payer: Medicare Other | Admitting: Occupational Therapy

## 2015-02-09 ENCOUNTER — Inpatient Hospital Stay (HOSPITAL_COMMUNITY): Payer: Medicare Other | Admitting: Physical Therapy

## 2015-02-09 LAB — CBC
HCT: 27 % — ABNORMAL LOW (ref 36.0–46.0)
HEMOGLOBIN: 8.7 g/dL — AB (ref 12.0–15.0)
MCH: 32.6 pg (ref 26.0–34.0)
MCHC: 32.2 g/dL (ref 30.0–36.0)
MCV: 101.1 fL — ABNORMAL HIGH (ref 78.0–100.0)
Platelets: 151 10*3/uL (ref 150–400)
RBC: 2.67 MIL/uL — ABNORMAL LOW (ref 3.87–5.11)
RDW: 15.9 % — AB (ref 11.5–15.5)
WBC: 4.1 10*3/uL (ref 4.0–10.5)

## 2015-02-09 LAB — HEPARIN LEVEL (UNFRACTIONATED)
Heparin Unfractionated: 0.34 IU/mL (ref 0.30–0.70)
Heparin Unfractionated: 0.56 IU/mL (ref 0.30–0.70)

## 2015-02-09 LAB — GLUCOSE, CAPILLARY
GLUCOSE-CAPILLARY: 196 mg/dL — AB (ref 65–99)
GLUCOSE-CAPILLARY: 193 mg/dL — AB (ref 65–99)
GLUCOSE-CAPILLARY: 168 mg/dL — AB (ref 65–99)
GLUCOSE-CAPILLARY: 212 mg/dL — AB (ref 65–99)

## 2015-02-09 LAB — PROTIME-INR
INR: 1.18 (ref 0.00–1.49)
PROTHROMBIN TIME: 15.2 s (ref 11.6–15.2)

## 2015-02-09 MED ORDER — WARFARIN SODIUM 7.5 MG PO TABS
7.5000 mg | ORAL_TABLET | Freq: Once | ORAL | Status: AC
  Administered 2015-02-09: 7.5 mg via ORAL
  Filled 2015-02-09: qty 1

## 2015-02-09 MED ORDER — DARBEPOETIN ALFA 60 MCG/0.3ML IJ SOSY: 60.0000 ug | PREFILLED_SYRINGE | INTRAMUSCULAR | Status: DC

## 2015-02-09 MED ORDER — WARFARIN - PHARMACIST DOSING INPATIENT: Freq: Every day | Status: DC

## 2015-02-09 MED ORDER — DARBEPOETIN ALFA 100 MCG/0.5ML IJ SOSY
100.0000 ug | PREFILLED_SYRINGE | Freq: Once | INTRAMUSCULAR | Status: AC
  Administered 2015-02-09: 100 ug via SUBCUTANEOUS
  Filled 2015-02-09 (×2): qty 0.5

## 2015-02-09 NOTE — Progress Notes (Signed)
Patient ID: Stefanie [name], female   DOB: [birth year], 69 y.o.   MRN: [id number]  [facility] Progress Note    Subjective:   Feels well and transferred for CIR   Objective:   BP 143/77 mmHg  Pulse 68  Temp(Src) 98.5 F (36.9 C) (Oral)  Resp 16  Wt 83.462 kg (184 lb)  SpO2 100%  Intake/Output:     Intake/Output this shift:  Total I/O In: 240 [P.O.:240] Out: -  Weight change:   Physical Exam: Gen:WD obese AAF in NAd CVS:no rub Resp:cta [id number] Ext:no edema, L fem AVG +T/B  Labs: BMET  Recent Labs Lab 02/05/15 0342 02/06/15 0453 02/08/15 0900  NA 135 137 133*  K 4.7 4.5 4.6  CL 97* 99* 97*  CO2 29 29 27   GLUCOSE 222* 201* 179*  BUN 40* 24* 50*  CREATININE 8.91* 5.93* 9.45*  ALBUMIN  --   --  2.9*  CALCIUM 9.3 9.3 9.7  PHOS  --   --  3.6   CBC  Recent Labs Lab 02/06/15 0453 02/07/15 0351 02/08/15 0251 02/09/15 0520  WBC 3.6* 4.8 5.2 4.1  HGB 9.2* 8.8* 8.5* 8.7*  HCT 28.5* 27.7* 26.8* 27.0*  MCV 100.0 99.6 100.0 101.1*  PLT 125* 136* 144* 151    @IMGRELPRIORS @ Medications:    . calcitRIOL  1.5 mcg Oral Q T,Th,Sa-HD  . calcium acetate  667 mg Oral TID WC  . carvedilol  3.125 mg Oral BID WC  . cinacalcet  90 mg Oral Q breakfast  . clopidogrel  75 mg Oral Daily  . [START ON 02/12/2015] darbepoetin (ARANESP) injection - DIALYSIS  60 mcg Intravenous Q Tue-HD  . docusate sodium  100 mg Oral BID  . gabapentin  300 mg Oral QHS  . insulin aspart  0-15 Units Subcutaneous TID WC  . insulin aspart  0-5 Units Subcutaneous QHS  . ketoconazole  1 application Topical Daily  . latanoprost  1 drop Left Eye QHS  . lisinopril  20 mg Oral Daily  . multivitamin  1 tablet Oral QHS  . ofloxacin  1 drop Both Eyes QID  . pantoprazole  40 mg Oral Daily  . polyethylene glycol  17 g Oral Daily  . pravastatin  40 mg Oral Daily  . rOPINIRole  0.5 mg Oral QHS  . warfarin  7.5 mg Oral ONCE-1800  . Warfarin - Pharmacist Dosing Inpatient   Does not  apply q1800   Dialysis Orders: [facility] T[facility] 3.5 hr 2 K 2 Ca profile 4 400/A 1.5 EDW 75 Heparin 4000 Mircera 50 q 2 w ksgiven 11/8 calcitriol 1.5 no Fe Recent: Hgb 10.8 iPTH 690 Ca/P ok  Assessment/ Plan:   1. L femoral vein DVT - on IV heparin. Question of whether DVT is proximal or distal to AVG venous anastamosis. VVS following, thoughts are that clot is distal to AVG. Had normal shuntogram per IR.Continue heparin drip and started on coumadin. Would stop plavix once she is therapeutic on coumadin. 2. ESRD - TTS - [place]. Next HD will be on 02/11/15 and then get back on TTS schedule 02/14/15.  Will give dose of aranesp today since she did not receive on Tuesday. 3. HTN - on lisinopril 20 mg PO Daily. BP controlled.  4. Volume - Last HD 11/20. Net UF 1800. Post wt 80.2 kg. Roughly 5 kg over OP EDW. Question accuracy. No edema. EDW recently lowered in HD center.  5. Anemia - Hgb continues to trend downward. Transfuse prn,  will redose ESA next treatment  6. Metabolic bone disease - Continue calcitriol/binders/sensipar 7. Nutrition - renal carb mod diet/vitamin- unintentional weight loss - add nepro 8. DM - per primary 9. Thrombocytopenia - Platelets improved to 151 today. Will follow CBC.  10. CAD/Elevated Troponin/Acute on Chronic Systolic Dysfunction: Cardiology following.Troponin elevation flat. Echo done 11/19 shows Moderate to severe global reduction in LV function, grade 2 diastolic dysfunction with elevated LV filling pressure. EF 30-35%. High risk-Stress test which led to cardiac cath 02/05/15 revealing severe LV dysfunction out of proportion to CAD now on coreg. multivessel CAD with chronic lesions, nothing acute. Seen by Cardiology without plans for any further intervention.  Donetta Potts, MD [place] Pager [phone] 02/09/2015, 10:37 AM

## 2015-02-09 NOTE — Progress Notes (Signed)
Rn was called in to patient's room to check on patient's bleeding left thigh graft. Pressure on site was initiated, continued to bleed after pressure was removed. RN called dialysis to assist with bleeding graft. Dialysis RN sent some surgical foam  dressing to apply on graft. Site continued to bleed despite applying pressure with the foam dressing. IV team consult placed. MD notified with orders. Heparin gtt held for 2 hours.   Graft site stopped bleeding post 30 minutes of continued pressure. Saline bags placed on top of site.   Restarted heparin gtt after 2 hours. Graft site rechecked, no bleeding noted. Will monitor.

## 2015-02-09 NOTE — Progress Notes (Signed)
[facility] PHYSICAL MEDICINE & REHABILITATION     PROGRESS NOTE    Subjective/Complaints: Pt had persistent bleeding from graft after HD yesterday. Resolved with continued pressure and temporary hold of heparin gtt. Pt is concerned this morning that she's not getting enough insulin  ROS: Pt denies fever, rash/itching, headache, blurred or double vision, nausea, vomiting, abdominal pain, diarrhea, chest pain, shortness of breath, palpitations, dysuria, dizziness, neck or back pain,   anxiety, or depression  Objective: Vital Signs: Blood pressure 143/77, pulse 68, temperature 98.5 F (36.9 C), temperature source Oral, resp. rate 16, weight 83.462 kg (184 lb), SpO2 100 %. No results found.  Recent Labs  02/08/15 0251 02/09/15 0520  WBC 5.2 4.1  HGB 8.5* 8.7*  HCT 26.8* 27.0*  PLT 144* 151    Recent Labs  02/08/15 0900  NA 133*  K 4.6  CL 97*  GLUCOSE 179*  BUN 50*  CREATININE 9.45*  CALCIUM 9.7   CBG (last 3)   Recent Labs  02/08/15 1627 02/08/15 2125 02/09/15 0655  GLUCAP 142* 173* 196*    Wt Readings from Last 3 Encounters:  02/09/15 83.462 kg (184 lb)  02/08/15 82.4 kg (181 lb 10.5 oz)  03/23/14 88.451 kg (195 lb)    Physical Exam:  Constitutional: She is oriented to person, place, and time. She appears well-developed and well-nourished.  HENT:  Head: Normocephalic and atraumatic.  Mouth/Throat: Oropharynx is clear and moist.  Eyes: Conjunctivae are normal. Pupils are equal, round, and reactive to light.  Neck: Normal range of motion. Neck supple.  Cardiovascular: Normal rate and regular rhythm.  No murmur heard. Respiratory: Effort normal and breath sounds normal. No respiratory distress. She has no wheezes.  GI: Soft. She exhibits no distension. There is no tenderness.  Musculoskeletal: She exhibits edema (min edema left thigh). She exhibits no tenderness.  AVG in left groin--no drainage seen. Still dressed/has Saline bag on leg still. No pain  with palpation of LLE. Has some decreased sensation RLE.  Neurological: She is alert and oriented to person, place, and time.  Speech dysarthric (edentulous) and able to follow basic commands without difficulty. Sensory deficits RLE? Does sense pain in foot/ankle area. UE: 4/5 prox to distal. LE: 2/5LLE with hf, LLE KE and ADF/APF 3/5. RLE: 3/5 hf, 4/5 ke and adf/apf  Skin: Skin is warm and dry. No rash noted. No erythema.  Psychiatric: She has a normal mood and affect. Her behavior is normal. Thought content normal.   Assessment/Plan: 1. Weakness and balance deficits secondary to multiple medical issues/debility which require 3+ hours per day of interdisciplinary therapy in a comprehensive inpatient rehab setting. Physiatrist is providing close team supervision and 24 hour management of active medical problems listed below. Physiatrist and rehab team continue to assess barriers to discharge/monitor patient progress toward functional and medical goals.  Function:  Bathing Bathing position      Bathing parts      Bathing assist        Upper Body Dressing/Undressing Upper body dressing                    Upper body assist        Lower Body Dressing/Undressing Lower body dressing                                  Lower body assist        Toileting Toileting  Toileting assist     Transfers Chair/bed Physiological scientist Comprehension Comprehension assist level: Understands basic 90% of the time/cues < 10% of the time  Expression    Social Interaction Social Interaction assist level: Interacts appropriately 90% of the time - Needs monitoring or encouragement for participation or interaction.  Problem Solving Problem solving assist level: Solves basic 50 - 74% of the time/requires cueing 25 - 49% of the time  Memory Memory assist level: Recognizes or recalls 75 - 89% of  the time/requires cueing 10 - 24% of the time   Medical Problem List and Plan: 1. Functional deficits secondary to Debility with weakness and balance deficits  2. LLE DVT Prophylaxis/Anticoagulation: Pharmaceutical: Coumadin with heparin bridge. Stop plavix once coumadin therapeutic 3. Pain Management: On Neurontin at nights for neuropathy? Continue tylenol prn 4. Mood: LCSW to follow up for evaluation and support.  5. Neuropsych: This patient is capable of making decisions on her own behalf. 6. Skin/Wound Care: Routine pressure relief measures 7. Fluids/Electrolytes/Nutrition: Continue 1200 cc FR daily.  8. CAD: Treat medically and monitor for symptoms/tolerance with increase in activity. 9. HTN: monitor BP bid on lisinopril and coreg.  10. ESRD: Nephrology to follow for assistance. Continue HD MWF in afternoons to help with tolerance of therapy during the day.  -bleeding from graft site post HD yesterday. Resolved with continued pressure and holding of heparin for 2 hours.  11. DM type 2: Used novolog bid prn at home. May benefit from scheduled insulin dosing. Will follow for pattern and Continue to monitor BS ac/hs and use SSI for elevated BS.  12. Anemia of chronic disease: On Aranesp. Monitor and transfuse prn symptoms or Hgb less than 7.0 13. Thrombocytopenia: Monitoring closely with heparin on board. Monitor for signs of bleeding.  14. PVD: On plavix due to multiple stents left iliac vein. --see above LOS (Days) 1 A FACE TO FACE EVALUATION WAS PERFORMED  Stefanie [name] T 02/09/2015 11:10 AM

## 2015-02-09 NOTE — Evaluation (Signed)
Occupational Therapy Assessment and Plan  Patient Details  Name: Stefanie [name] MRN: [id number] Date of Birth: 26-Nov-1945  OT Diagnosis: abnormal posture, hemiplegia affecting dominant side, hemiplegia affecting non-dominant side and muscle weakness (generalized) Rehab Potential: Rehab Potential (ACUTE ONLY): Good ELOS: 12-14 days   Today's Date: 02/09/2015 OT Individual Time: 3817-7116 OT Individual Time Calculation (min): 62 min     Problem List:  Patient Active Problem List   Diagnosis Date Noted  . Debility 02/08/2015  . DVT (deep venous thrombosis) ([place])   . CHF (congestive heart failure) ([place])   . Chronic diastolic congestive heart failure ([place])   . ESRD (end stage renal disease) ([place])   . Congestive dilated cardiomyopathy ([place])   . Left leg DVT ([place]) 02/01/2015  . Leukocytosis 02/01/2015  . Elevated troponin 02/01/2015  . SOB (shortness of breath) 02/01/2015  . Type 2 diabetes, controlled, with neuropathy ([place]) 12/20/2014  . Pain due to onychomycosis of toenail 12/20/2014  . 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 Mechanical complication of other vascular device, implant, and graft 05/19/2013  . PAD (peripheral artery disease) ([place]) 02/24/2012  . Murmur 12/24/2011  . Bradycardia-intermittent sinus 07/10/2010  . DYSPNEA ON EXERTION 02/25/2010  . CHEST PAIN, EXERTIONAL 02/25/2010  . DM 02/24/2010  . PROTEIN MALNUTRITION 02/24/2010  . HLD (hyperlipidemia) 02/24/2010  . ANEMIA 02/24/2010  . Essential hypertension 02/24/2010  . Cerebral artery occlusion with cerebral infarction ([place]) 02/24/2010  . ESRD on dialysis ([place]) 02/24/2010  . 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 Hypertension   . Hyperlipidemia   . Diabetes mellitus   . CVA (cerebral vascular accident) ([place]) [place]    left sided weakness  . Protein malnutrition ([place])   . Secondary hyperparathyroidism ([place])   . Anemia    . End stage renal disease ([place])     initiated HD 2006  . Hx of echocardiogram     a. Echo 03/2010: EF 55-60%, Gr 1 diast dysfn, trivial MS, mild to mod LAE, PASP 34, ;  b. Echo 11/13: mild LVH, EF 60%, Gr 1 diast dysfn, Ao sclerosis, no AS, MAC, slight MS, mean 3 mmHg, PASP 34  . Hx of cardiovascular stress test     a. MV 03/2010:  no ischemia, EF 55%  . CHF (congestive heart failure) ([place])   . Retinal detachment, bilateral   . CAD (coronary artery disease)    Past Surgical History:  Past Surgical History  Procedure Laterality Date  . Cataract extraction    . Tubal ligation    . Arteriovenous graft placement      BUE numerous times  . Arteriovenous graft placement  11/13/10    Lt femoral - Dr. Kellie Simmering  . Dg av dialysis graft declot or  06/06/11, 08/26/11    performed in IR  . Shuntogram N/A 05/29/2013    Procedure: Earney Mallet;  Surgeon: Conrad Landen, MD;  Location: [facility] C[facility] LAB;  Service: Cardiovascular;  Laterality: N/A;  . Shuntogram Left 03/01/2014    Procedure: SHUNTOGRAM;  Surgeon: Conrad Tom Green, MD;  Location: [facility] CATH LAB;  Service: Cardiovascular;  Laterality: Left;  . Cardiac catheterization N/A 02/05/2015    Procedure: Left Heart Cath and Coronary Angiography;  Surgeon: Jettie Booze, MD;  Location: [facility] CV LAB;  Service: Cardiovascular;  Laterality: N/A;  . Peripheral vascular catheterization N/A 02/06/2015    Procedure: A/V Shuntogram;  Surgeon: Elam Dutch, MD;  Location: [facility] CV LAB;  Service: Cardiovascular;  Laterality: N/A;    Assessment & Plan Clinical Impression: Patient is a 69 y.o. year old female with recent admission to the hospital on11/18/16 with SOB, left leg discomfort with edema and Left proximal femoral DVT. CTA lungs negative for PE and she was started on IV heparin. Dr. Cathie Olden consulted for input due to progressive SOB limiting activity. Myoview done showing high risk with mixed infarct and ischemia therefore cath recommended for  workup. Cardiac cath showed left ventricular EF < 25% with global hypokinesis and 100% stenosis 1st Diag lesion, 100% stenosed. Mid RCA lesion, 100% stenosed. faint left to right collaterals are present. To be treated medically per Dr. Burt Knack. Patient with history of left thigh AVG and Dr. Scot Dock consulted for input. Left thigh shuntogram showed 60% in-stent stenosis requiring angioplasty.  Patient transferred to CIR on 02/08/2015 .    Patient currently requires min with basic self-care skills secondary to muscle weakness and muscle joint tightness, impaired timing and sequencing, unbalanced muscle activation and decreased coordination and decreased standing balance and decreased postural control.  Prior to hospitalization, patient could complete ADLs with modified independent .  Patient will benefit from skilled intervention to decrease level of assist with basic self-care skills and increase independence with basic self-care skills prior to discharge home with occasional assist from her aide.  Anticipate patient will require 24 hour supervision and follow up home health.  OT - End of Session Activity Tolerance: Tolerates 30+ min activity with multiple rests Endurance Deficit: Yes OT Assessment Rehab Potential (ACUTE ONLY): Good Barriers to Discharge: Decreased caregiver support Barriers to Discharge Comments: pt lives alone, only has aide 3 hrs a day OT Patient demonstrates impairments in the following area(s): Balance;Endurance;Motor;Sensory OT Basic ADL's Functional Problem(s): Grooming;Bathing;Dressing;Toileting OT Advanced ADL's Functional Problem(s): Simple Meal Preparation OT Transfers Functional Problem(s): Toilet;Tub/Shower OT Additional Impairment(s): Fuctional Use of Upper Extremity OT Plan OT Intensity: Minimum of 1-2 x/day, 45 to 90 minutes OT Frequency: 5 out of 7 days OT Duration/Estimated Length of Stay: 12-14 days OT Treatment/Interventions: [place];Neuromuscular re-education;Self Care/advanced ADL retraining;Therapeutic Exercise;UE/LE Strength taining/ROM;Patient/family education;Pain management;Community reintegration;Discharge planning;Functional mobility training;Therapeutic Activities;UE/LE Coordination activities OT Self Feeding Anticipated Outcome(s): modified independent OT Basic Self-Care Anticipated Outcome(s): supervision to modified independent OT Toileting Anticipated Outcome(s): modified independent OT Bathroom Transfers Anticipated Outcome(s): supervision to modified independent OT Recommendation Patient destination: Home Follow Up Recommendations: Home health OT Equipment Recommended: To be determined   Skilled Therapeutic Intervention Selfcare retraining sit to stand EOB.  Pt able to reach bilateral feet for bathing and dressing tasks but exhibits difficulty lifting the LLE into her underpants or pants after completion of the RLE.  Min assist for sit to stand with min assist needed to pull up pants and underpants while maintaining balance.  Pt with moderate trunk and cervical flexion present in standing with LOB posteriorly.  Pt transferred to the bedside chair with min assist maintaining flexed posture.  Noted valgus formation in the left knee with mobility.  Pt left in the bedside chair with call button and phone within reach.  Notified NT to assist with reheating breakfast so she could finish.    OT Evaluation Precautions/Restrictions  Precautions Precautions: Fall Precaution Comments: history of B CVAs Restrictions Weight Bearing Restrictions: No  Vital Signs Therapy Vitals Temp: 98 F (36.7 C) Temp Source: Oral Pulse Rate: (!) 59 Resp: 16 BP: (!) 128/50 mmHg Patient Position (if appropriate): Lying Oxygen Therapy SpO2: 100 %  O2 Device: Not Delivered Pain Pain Assessment Pain Assessment: No/denies pain Home Living/Prior Functioning Home Living Available Help  at Discharge: Personal care attendant Type of Home: Apartment Home Access: Elevator Home Layout: One level Bathroom Shower/Tub: Multimedia programmer: Standard Bathroom Accessibility: No Additional Comments: pt has an aide 7 days a week for 2.5 hours except monday she has 3 hours (A). Pt otherwise alone per patient  Lives With: Alone Prior Function Level of Independence: Requires assistive device for independence  Able to Take Stairs?: No Driving: No Vocation: Retired Comments: Pt enjoys reading ADL  See Function Section of chart  Vision/Perception  Vision- History Baseline Vision/History: Wears glasses;Glaucoma Wears Glasses: Reading only Patient Visual Report: No change from baseline Vision- Assessment Vision Assessment?: No apparent visual deficits  Cognition Overall Cognitive Status: Within Functional Limits for tasks assessed Arousal/Alertness: Awake/alert Orientation Level: Person;Place;Situation Person: Oriented Place: Oriented Situation: Oriented Year: 2016 Month: November Day of Week: Correct Memory: Impaired Memory Impairment: Decreased recall of new information Immediate Memory Recall: Sock;Blue;Bed Memory Recall: Sock;Blue;Bed Memory Recall Sock: Without Cue Memory Recall Blue: With Cue Memory Recall Bed: Without Cue Attention: Sustained Focused Attention: Appears intact Sustained Attention: Appears intact Awareness: Appears intact Problem Solving: Appears intact Safety/Judgment: Appears intact Sensation Sensation Light Touch: Impaired Detail Light Touch Impaired Details: Impaired RUE Stereognosis: Impaired Detail Stereognosis Impaired Details: Impaired RUE Hot/Cold: Not tested Proprioception: Not tested Additional Comments: Pt with intact light touch in [place] but reports being diminished in the LUE compared to the right.  Coordination Gross Motor Movements are Fluid and Coordinated: No Fine Motor Movements are Fluid and Coordinated:  No Coordination and Movement Description: Pt with gross dysmetria bilaterally with decreased FM capabilities bilaterally as well with right hand being more limited than LUE.  She was able to use them both for bilateral tasks such as donning all clothing.  9 Hole Peg Test: 53 seconds on the left and unable to perform on the right Motor  Motor Motor: Abnormal tone;Ataxia;Hemiplegia;Abnormal postural alignment and control Mobility  Transfers Transfers: Sit to Stand;Stand to Sit Sit to Stand: 3: Mod assist;With armrests;From chair/3-in-1;With upper extremity assist Sit to Stand Details: Manual facilitation for placement;Manual facilitation for weight shifting;Verbal cues for technique;Verbal cues for sequencing Stand to Sit: 3: Mod assist;With upper extremity assist;To chair/3-in-1 Stand to Sit Details (indicate cue type and reason): Verbal cues for safe use of DME/AE;Verbal cues for technique;Verbal cues for sequencing;Manual facilitation for weight shifting  Trunk/Postural Assessment  Cervical Assessment Cervical Assessment: Exceptions to [facility] (cervical protraction and flexion in sitting and standing) Thoracic Assessment Thoracic Assessment: Exceptions to [facility] (thoracic kyphosis present) Lumbar Assessment Lumbar Assessment: Exceptions to [facility] (pt maintians lumbar flexion in sitting position and demonstrates decreased extension for upright standing posture) Postural Control Postural Control: Deficits on evaluation Postural Limitations: posterior pelvic tilt with decreased ability to achieve full hip extension and anterior pelvic tilt in standing.   Balance Balance Balance Assessed: Yes Static Sitting Balance Static Sitting - Balance Support: No upper extremity supported;Feet supported Static Sitting - Level of Assistance: 5: Stand by assistance Dynamic Sitting Balance Dynamic Sitting - Balance Support: Feet supported;Bilateral upper extremity supported Dynamic Sitting - Level of Assistance:  5: Stand by assistance Static Standing Balance Static Standing - Balance Support: Right upper extremity supported;Left upper extremity supported Static Standing - Level of Assistance: 4: Min assist Dynamic Standing Balance Dynamic Standing - Balance Support: During functional activity;Bilateral upper extremity supported Dynamic Standing - Level of Assistance: 4: Min assist Extremity/Trunk Assessment  RUE Assessment RUE Assessment: Exceptions to [facility] RUE AROM (degrees) RUE Overall AROM Comments: Pt with history of left CVA.   Demonstrates decreased strength throughout at 3+/5 with gross digit flexion and extension WFLS except for thumb.  Pt with limitations in IP and MP flexion and extension and unable to oppose thumb to tip of any digit.   LUE Assessment LUE Assessment: Exceptions to [facility] LUE AROM (degrees) LUE Overall AROM Comments: AROM for RUE WFLS with strength at 4/5 throughout.  Dysmetria noted with decreased gross and FM coordination during functional use with bathing and dressing tasks.    See Function Navigator for Current Functional Status.   Refer to Care Plan for Long Term Goals  Recommendations for other services: None  Discharge Criteria: Patient will be discharged from OT if patient refuses treatment 3 consecutive times without medical reason, if treatment goals not met, if there is a change in medical status, if patient makes no progress towards goals or if patient is discharged from hospital.  The above assessment, treatment plan, treatment alternatives and goals were discussed and mutually agreed upon: by patient  Jaqlyn Gruenhagen OTR/L 02/09/2015, 4:35 PM

## 2015-02-09 NOTE — Progress Notes (Signed)
ANTICOAGULATION CONSULT NOTE - Follow Up Consult  Pharmacy Consult for heparin Indication: DVT  No Known Allergies  Patient Measurements: Weight: 184 lb (83.462 kg) Heparin Dosing Weight:   Vital Signs: Temp: 98 F (36.7 C) (11/26 1348) Temp Source: Oral (11/26 1348) BP: 128/50 mmHg (11/26 1348) Pulse Rate: 59 (11/26 1348)  Labs:  Recent Labs  02/07/15 0351  02/08/15 0251 02/08/15 0900 02/08/15 1431 02/09/15 0520 02/09/15 1720  HGB 8.8*  --  8.5*  --   --  8.7*  --   HCT 27.7*  --  26.8*  --   --  27.0*  --   PLT 136*  --  144*  --   --  151  --   LABPROT  --   --  14.8  --   --  15.2  --   INR  --   --  1.14  --   --  1.18  --   HEPARINUNFRC  --   < > 0.40  --  0.64 0.34 0.56  CREATININE  --   --   --  9.45*  --   --   --   < > = values in this interval not displayed.  Estimated Creatinine Clearance: 5.9 mL/min (by C-G formula based on Cr of 9.45).   Medications:  Scheduled:  . calcitRIOL  1.5 mcg Oral Q T,Th,Sa-HD  . calcium acetate  667 mg Oral TID WC  . carvedilol  3.125 mg Oral BID WC  . cinacalcet  90 mg Oral Q breakfast  . clopidogrel  75 mg Oral Daily  . [START ON 02/16/2015] darbepoetin (ARANESP) injection - DIALYSIS  60 mcg Intravenous Q Sat-HD  . docusate sodium  100 mg Oral BID  . gabapentin  300 mg Oral QHS  . insulin aspart  0-15 Units Subcutaneous TID WC  . insulin aspart  0-5 Units Subcutaneous QHS  . ketoconazole  1 application Topical Daily  . latanoprost  1 drop Left Eye QHS  . lisinopril  20 mg Oral Daily  . multivitamin  1 tablet Oral QHS  . ofloxacin  1 drop Both Eyes QID  . pantoprazole  40 mg Oral Daily  . polyethylene glycol  17 g Oral Daily  . pravastatin  40 mg Oral Daily  . rOPINIRole  0.5 mg Oral QHS  . warfarin  7.5 mg Oral ONCE-1800  . Warfarin - Pharmacist Dosing Inpatient   Does not apply q1800   Infusions:  . heparin 1,200 Units/hr (02/09/15 0351)    Assessment: 69 yo female with DVT is currently on therapeutic  heparin.  Heparin level is 0.56. Goal of Therapy:  Heparin level 0.3-0.7 units/ml Monitor platelets by anticoagulation protocol: Yes   Plan:  - continue heparin at 1200 units/hr - daily heparin level and CBC  Angelyn Osterberg, Tsz-Yin 02/09/2015,5:49 PM

## 2015-02-09 NOTE — Progress Notes (Signed)
Occupational Therapy Session Note  Patient Details  Name: Stefanie [name] MRN: [id number] Date of Birth: [birth year]  Today's Date: 02/09/2015 OT Individual Time: [id number] OT Individual Time Calculation (min): 61 min    Short Term Goals: Week 1:  OT Short Term Goal 1 (Week 1): Pt will perform LB bathing with supervision shower level sit to stand. OT Short Term Goal 2 (Week 1): Pt will perform LB dressing with supervision sit to stand.  OT Short Term Goal 3 (Week 1): Pt will perform toilet transfers and toileting with supervision sit to stand.  OT Short Term Goal 4 (Week 1): Pt will perform simple snack prep with RW and close supervision.   Skilled Therapeutic Interventions/Progress Updates:   Pt transferred supine to sit EOB and donned shoes over her gripper socks.  Min assist needed to place the left heel all the way in the left shoe.  She was able to complete short distance ambulation to the bathroom for practice of a toilet transfer with min assist, using the rollator.  She needed min assist with mobility as well as for sit to stand from the EOB and 3:1 over the toilet.  Had pt transfer to the wheelchair from the bathroom and then therapist took her to the gym.  Mod assist needed for stand pivot transfer from the wheelchair to the therapy mat.  Worked on FM coordination using 9 hole peg test.  Pt unable to complete with the RUE secondary to limitations in thumb opposition, but did complete in 53 seconds on the second attempt.  First attempt pt placed all pegs in and then dumped them back out by turning over the grid instead of following the directions to remove them one at a time.  Progressed to trying to pick up a cup with the RUE.  She could not grasp it around the base secondary to decreased right thumb abduction but could grasp it over the top edge to place it in a stack.  Also worked on standing balance by having her pick up clothespins with the LUE only, from container placed on the mat  beside of where she was standing and then placing them on a vertical pole.  Min assist for standing during this task.  Attempted wheelchair mobility back to the room however pt not able to complete with the RUE.  Had pt ambulate with rollator for approximately 15 feet from outside of her room back to her bed so she could lay down.  Supervision for transfer back to the bed.  Discussed anticipation of pt needing more supervision at discharge but will depend on progress the next week.  Pt left with call button and phone in reach.   Therapy Documentation Precautions:  Precautions Precautions: Fall Precaution Comments: history of B CVAs Restrictions Weight Bearing Restrictions: No  Vital Signs: Therapy Vitals Temp: 98 F (36.7 C) Temp Source: Oral Pulse Rate: (!) 59 Resp: 16 BP: (!) 128/50 mmHg Patient Position (if appropriate): Lying Oxygen Therapy SpO2: 100 % O2 Device: Not Delivered Pain: Pain Assessment Pain Assessment: No/denies pain ADL: See Function Navigator for Current Functional Status.   Therapy/Group: Individual Therapy  Queen Abbett OTR/L 02/09/2015, 4:47 PM

## 2015-02-09 NOTE — Evaluation (Signed)
Physical Therapy Assessment and Plan  Patient Details  Name: Stefanie [name] Clarida MRN: [id number] Date of Birth: 1945/11/17  PT Diagnosis: Abnormal posture, Abnormality of gait, Ataxia, Ataxic gait, Cognitive deficits, Coordination disorder, Difficulty walking, Edema, Hemiparesis dominant, Hypertonia, Impaired cognition, Impaired sensation, Muscle weakness, Osteoarthritis and Pain in L ankle Rehab Potential: Good ELOS: 2 weeks   Today'[name] Date: 02/09/2015 PT Individual Time: 1000-1115 PT Individual Time Calculation (min): 75 min    Problem List:  Patient Active Problem List   Diagnosis Date Noted  . Debility 02/08/2015  . DVT (deep venous thrombosis) ([place])   . CHF (congestive heart failure) ([place])   . Chronic diastolic congestive heart failure ([place])   . ESRD (end stage renal disease) ([place])   . Congestive dilated cardiomyopathy ([place])   . Left leg DVT ([place]) 02/01/2015  . Leukocytosis 02/01/2015  . Elevated troponin 02/01/2015  . SOB (shortness of breath) 02/01/2015  . Type 2 diabetes, controlled, with neuropathy ([place]) 12/20/2014  . Pain due to onychomycosis of toenail 12/20/2014  . 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 Mechanical complication of other vascular device, implant, and graft 05/19/2013  . PAD (peripheral artery disease) ([place]) 02/24/2012  . Murmur 12/24/2011  . Bradycardia-intermittent sinus 07/10/2010  . DYSPNEA ON EXERTION 02/25/2010  . CHEST PAIN, EXERTIONAL 02/25/2010  . DM 02/24/2010  . PROTEIN MALNUTRITION 02/24/2010  . HLD (hyperlipidemia) 02/24/2010  . ANEMIA 02/24/2010  . Essential hypertension 02/24/2010  . Cerebral artery occlusion with cerebral infarction ([place]) 02/24/2010  . ESRD on dialysis ([place]) 02/24/2010  . Secondary renal hyperparathyroidism ([place]) 02/24/2010    Past Medical History:  Past Medical History  Diagnosis Date  . Hypertension   . Hyperlipidemia   . Diabetes mellitus   . CVA (cerebral vascular accident) ([place]) [place]    left sided  weakness  . Protein malnutrition ([place])   . Secondary hyperparathyroidism ([place])   . Anemia   . End stage renal disease ([place])     initiated HD 2006  . Hx of echocardiogram     a. Echo 03/2010: EF 55-60%, Gr 1 diast dysfn, trivial MS, mild to mod LAE, PASP 34, ;  b. Echo 11/13: mild LVH, EF 60%, Gr 1 diast dysfn, Ao sclerosis, no AS, MAC, slight MS, mean 3 mmHg, PASP 34  . Hx of cardiovascular stress test     a. MV 03/2010:  no ischemia, EF 55%  . CHF (congestive heart failure) ([place])   . Retinal detachment, bilateral   . CAD (coronary artery disease)    Past Surgical History:  Past Surgical History  Procedure Laterality Date  . Cataract extraction    . Tubal ligation    . Arteriovenous graft placement      BUE numerous times  . Arteriovenous graft placement  11/13/10    Lt femoral - Dr. Kellie Simmering  . Dg av dialysis graft declot or  06/06/11, 08/26/11    performed in IR  . Shuntogram N/A 05/29/2013    Procedure: Earney Mallet;  Surgeon: Conrad Caswell Beach, MD;  Location: [facility] CATH LAB;  Service: Cardiovascular;  Laterality: N/A;  . Shuntogram Left 03/01/2014    Procedure: SHUNTOGRAM;  Surgeon: Conrad Blue Ash, MD;  Location: [facility] CATH LAB;  Service: Cardiovascular;  Laterality: Left;  . Cardiac catheterization N/A 02/05/2015    Procedure: Left Heart Cath and Coronary Angiography;  Surgeon: Jettie Booze, MD;  Location: [place] [facility] CV LAB;  Service: Cardiovascular;  Laterality: N/A;  . Peripheral vascular catheterization N/A 02/06/2015  Procedure: A/V Shuntogram;  Surgeon: Elam Dutch, MD;  Location: [place] CV LAB;  Service: Cardiovascular;  Laterality: N/A;    Assessment & Plan Clinical Impression: Stefanie [name] Strength is a 68 y.o. female with history fo HTN, DM type 2 with nephropathy, ESRD, CAD who was admitted from [facility] on 02/01/15 with SOB, left leg discomfort with edema and Left proximal femoral DVT. CTA lungs negative for PE and she was started on IV heparin. Dr. Cathie Olden consulted for input  due to progressive SOB limiting activity. Myoview done showing high risk with mixed infarct and ischemia therefore cath recommended for workup. Cardiac cath showed left ventricular EF < 25% with global hypokinesis and 100% stenosis 1st Diag lesion, 100% stenosed. Mid RCA lesion, 100% stenosed. faint left to right collaterals are present. To be treated medically per Dr. Burt Knack. Patient with history of left thigh AVG and Dr. Scot Dock consulted for input. 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 Oneida Alar on 11/23. PT evaluation done post procedure and CIR recommended due to deconditioned state, decreased balance, poor safety and inability to walk.   Patient transferred to CIR on 02/08/2015 .   Patient currently requires total with mobility secondary to muscle weakness, decreased cardiorespiratoy endurance, impaired timing and sequencing, abnormal tone, ataxia and decreased coordination, decreased memory and decreased sitting balance, decreased standing balance, decreased postural control, hemiplegia and decreased balance strategies.  Prior to hospitalization, patient was modified independent  with mobility and lived with Alone in a [place] home.  Home access is  Elevator.  Patient will benefit from skilled PT intervention to maximize safe functional mobility, minimize fall risk and decrease caregiver burden for planned discharge home with intermittent assist.  Anticipate patient will benefit from follow up [facility] at discharge.  PT - End of Session Activity Tolerance: Tolerates < 10 min activity with changes in vital signs Endurance Deficit: Yes Endurance Deficit Description: cardiorespiratory PT Assessment Rehab Potential (ACUTE/IP ONLY): Good Barriers to Discharge: Decreased caregiver support Barriers to Discharge Comments: Lives alone; PCA available 3 hours/day PT Patient demonstrates  impairments in the following area([name]): Balance;Endurance;Motor;Edema;Pain;Safety;Sensory PT Transfers Functional Problem([name]): Bed Mobility;Bed to Chair;Car;Furniture PT Locomotion Functional Problem([name]): Ambulation;Wheelchair Mobility;Stairs PT Plan PT Intensity: Minimum of 1-2 x/day ,45 to 90 minutes PT Frequency: 5 out of 7 days PT Duration Estimated Length of Stay: 2 weeks PT Treatment/Interventions: Ambulation/gait training;Discharge planning;Functional mobility training;Psychosocial support;Therapeutic Activities;Balance/vestibular training;Disease management/prevention;Neuromuscular re-education;Therapeutic Exercise;Wheelchair propulsion/positioning;Cognitive remediation/compensation;DME/adaptive equipment instruction;Pain management;Splinting/orthotics;UE/LE Strength taining/ROM;Community reintegration;Patient/family education;Stair training;UE/LE Coordination activities PT Transfers Anticipated Outcome([name]): Supervision PT Locomotion Anticipated Outcome([name]): Supervision short distance gait PT Recommendation Follow Up Recommendations: Other (comment);Home health PT (level of supervision tbd, pending progress) Patient destination: Home Equipment Recommended: To be determined Equipment Details: Pt already owns 4WW  Skilled Therapeutic Intervention Pt received up in recliner, agreeable to PT session. PT Evaluation - Pt presents with significant baseline level deficits from 2 prior strokes, affecting each side of her body, as well as low activity tolerance from being a dialysis patient. Pt demonstrates: ataxia in R arm>L arm and L leg > R leg, R UE weakness and L LE weakness, severe R knee OA resulting in genu valgus dysfunction, impaired coordination in all 4 extremities, light touch sensation diminished in R arm compared to L arm, low activity tolerance and DOE with minimal exertion. Pt likely to benefit from IPR to maximize functional independence. Goals set conservatively at Supervision, with  possibility of  being upgraded, pending pt progress. Therapeutic Activity - see below for bed mobility and transfer details. Pt req mod A for sit to stand from recliner, but min A for sit to stand from EOB or w/c. Pt reports daughter has sedan style car and PT instructs pt in car transfer with 4WW from w/c to/from TranSit vehicle set at low height req min A for boost & balance, but pt able to self manage legs in/out of w/c. Gait Training - see below for details. Pt req +2 for equipment management: w/c follow due to low endurance and IV pole management, as well as min A from PT. Significant gait compensations noted: wide BOS, severe R genu valgus, poor foot clearance bilaterally with shuffle steps, less ankle DF of L LE with slight circumduction compensation for swing, and mild ataxia with gait. W/C Management - PT obtained a 20x16 manual w/c and instructs pt in w/c propulsion with B UEs x 40' req up to mod A. Pt may benefit from an 18x16 manual w/c for improved fit. Pt needs assist with w/c parts. Pt ended up in w/c with all needs in reach. Continue per PT POC.    PT Evaluation Precautions/Restrictions Precautions Precautions: Fall General Chart Reviewed: Yes Family/Caregiver Present: No  Pain Pain Assessment Pain Assessment: 0-10 Pain Score: 8  Pain Type: Acute pain Pain Location: Ankle Pain Orientation: Left Pain Descriptors / Indicators: Sore Pain Onset: On-going Pain Intervention([name]): Rest Multiple Pain Sites: No Home Living/Prior Functioning Home Living Available Help at Discharge: Personal care attendant (3 hours daily) Type of Home: Apartment Home Access: Elevator Home Layout: One level Bathroom Shower/Tub: Multimedia programmer: Standard Bathroom Accessibility: Yes Additional Comments: pt has an aide 7 days a week for 2.5 hours except monday she has 3 hours (A). Pt otherwise alone per patient  Lives With: Alone Prior Function Level of Independence: Requires assistive  device for independence  Able to Take Stairs?: No Driving: No Vocation: Retired Comments: Pt enjoys reading Vision/Perception     Cognition Overall Cognitive Status: Within Functional Limits for tasks assessed Arousal/Alertness: Awake/alert Orientation Level: Oriented X4 Attention: Focused;Sustained Focused Attention: Appears intact Sustained Attention: Appears intact Memory: Impaired Memory Impairment: Decreased recall of new information (Pt recalls 2/4 words) Awareness: Appears intact Problem Solving: Appears intact Safety/Judgment: Appears intact Sensation Sensation Light Touch: Impaired Detail Light Touch Impaired Details: Impaired RUE Stereognosis: Not tested Hot/Cold: Not tested Proprioception: Impaired Detail Proprioception Impaired Details: Impaired RLE;Impaired LLE Additional Comments: R UE more diminished to LT than L UE; pt reports B LEs wfl for LT; proprioception worse in L ankle vs R ankle but both impaired Coordination Gross Motor Movements are Fluid and Coordinated: No Fine Motor Movements are Fluid and Coordinated: Not tested Coordination and Movement Description: Pt must focus in order to overcome hypertonicity in R UE and L LE Finger Nose Finger Test: ataxic in B UEs; R worse than L Heel Shin Test: ataxia in L LE; R WFL Motor  Motor Motor: Abnormal tone;Ataxia;Hemiplegia;Abnormal postural alignment and control Motor - Skilled Clinical Observations: slouched and sacral sitting posture with head down; R arm and L leg hemiplegia from 2 prior strokes  Mobility Bed Mobility Bed Mobility: Rolling Right;Rolling Left;Sit to Supine;Right Sidelying to Sit Rolling Right: 4: Min assist Rolling Right Details: Manual facilitation for placement Rolling Left: 4: Min assist Rolling Left Details: Manual facilitation for placement Sit to Supine: 5: Supervision Sit to Supine - Details: Verbal cues for technique Transfers Transfers: Yes Sit to Stand: 3:  Mod assist;With  armrests;From chair/3-in-1;With upper extremity assist Sit to Stand Details: Manual facilitation for placement Stand Pivot Transfers: 4: Min assist Stand Pivot Transfer Details: Manual facilitation for placement;Verbal cues for technique;Verbal cues for safe use of DME/AE Locomotion  Ambulation Ambulation: Yes Ambulation/Gait Assistance: 1: +2 Total assist (min A from PT; +2 for equipment/w/c follow) Ambulation Distance (Feet): 22 Feet Assistive device: 4-wheeled walker Ambulation/Gait Assistance Details: Verbal cues for safe use of DME/AE;Manual facilitation for weight shifting Gait Gait: Yes Gait Pattern: Impaired Gait Pattern: Trunk flexed;Wide base of support;Poor foot clearance - right;Poor foot clearance - left;Ataxic (severe R genu valgus) Gait velocity: slower Wheelchair Mobility Wheelchair Mobility: Yes Wheelchair Assistance: 3: Mod assist Wheelchair Assistance Details: Manual facilitation for placement;Verbal cues for safe use of DME/AE Wheelchair Propulsion: Both upper extremities Wheelchair Parts Management: Needs assistance Distance: 30'  Trunk/Postural Assessment  Cervical Assessment Cervical Assessment: Exceptions to [facility] Cervical AROM Overall Cervical AROM: Deficits;Due to premorid status Overall Cervical AROM Comments: head postured forward and down Thoracic Assessment Thoracic Assessment: Exceptions to [facility] Thoracic AROM Overall Thoracic AROM: Deficits;Due to premorid status Overall Thoracic AROM Comments: significant kyphotic back Lumbar Assessment Lumbar Assessment: Within Functional Limits Postural Control Postural Control: Deficits on evaluation Postural Limitations: slouched & sacral sitting posture  Balance Balance Balance Assessed: Yes Static Sitting Balance Static Sitting - Balance Support: Feet supported Static Sitting - Level of Assistance: 5: Stand by assistance Dynamic Sitting Balance Dynamic Sitting - Balance Support: Feet supported;Bilateral  upper extremity supported Dynamic Sitting - Level of Assistance: 5: Stand by assistance Static Standing Balance Static Standing - Balance Support: During functional activity;Bilateral upper extremity supported Static Standing - Level of Assistance: 4: Min assist Dynamic Standing Balance Dynamic Standing - Balance Support: During functional activity;Bilateral upper extremity supported Dynamic Standing - Level of Assistance: 4: Min assist Extremity Assessment  RUE Assessment RUE Assessment: Exceptions to [facility] RUE AROM (degrees) Overall AROM Right Upper Extremity: Deficits;Due to premorbid status RUE Overall AROM Comments: arm flexion limited 25%; elbow & grip wfl RUE Strength RUE Overall Strength: Deficits;Due to premorbid status RUE Overall Strength Comments: arm flexion 3-/5, elbow 4+/5, grip 4+/5 RUE Tone RUE Tone: Hypertonic;Moderate Hypertonic Details: Pt finger flexors and elbow flexors with increased tone, but able to overcome with effort LUE Assessment LUE Assessment: Exceptions to WFL LUE AROM (degrees) Overall AROM Left Upper Extremity: Within functional limits for tasks assessed LUE Strength LUE Overall Strength: Within Functional Limits for tasks assessed LUE Overall Strength Comments: arm flexion 4+/5; otherwise 5/5 LUE Tone LUE Tone: Mild;Hypertonic Hypertonic Details: elbow flexors RLE Assessment RLE Assessment: Within Functional Limits LLE Assessment LLE Assessment: Exceptions to WFL LLE AROM (degrees) Overall AROM Left Lower Extremity: Within functional limits for tasks assessed LLE Strength LLE Overall Strength: Deficits;Due to premorbid status LLE Overall Strength Comments: hip flexion 4/5; knee grossly 4/5; ankle DF 3-/5 LLE Tone LLE Tone: Mild;Hypertonic   See Function Navigator for Current Functional Status.   Refer to Care Plan for Long Term Goals  Recommendations for other services: None  Discharge Criteria: Patient will be discharged from PT if  patient refuses treatment 3 consecutive times without medical reason, if treatment goals not met, if there is a change in medical status, if patient makes no progress towards goals or if patient is discharged from hospital.  The above assessment, treatment plan, treatment alternatives and goals were discussed and mutually agreed upon: by patient  [facility] M 02/09/2015, 12:34 PM

## 2015-02-09 NOTE — Progress Notes (Signed)
ANTICOAGULATION CONSULT NOTE - Follow Up Consult  Pharmacy Consult for Heparin/warfarin Indication: DVT  No Known Allergies  Patient Measurements: Weight: 184 lb (83.462 kg) Heparin Dosing Weight: 70 kg  Vital Signs: Temp: 98.5 F (36.9 C) (11/26 0540) Temp Source: Oral (11/26 0540) BP: 143/77 mmHg (11/26 0810) Pulse Rate: 68 (11/26 0810)  Labs:  Recent Labs  02/07/15 0351  02/08/15 0251 02/08/15 0900 02/08/15 1431 02/09/15 0520  HGB 8.8*  --  8.5*  --   --  8.7*  HCT 27.7*  --  26.8*  --   --  27.0*  PLT 136*  --  144*  --   --  151  LABPROT  --   --  14.8  --   --  15.2  INR  --   --  1.14  --   --  1.18  HEPARINUNFRC  --   < > 0.40  --  0.64 0.34  CREATININE  --   --   --  9.45*  --   --   < > = values in this interval not displayed.  Estimated Creatinine Clearance: 5.9 mL/min (by C-G formula based on Cr of 9.45).  Assessment: 69 year old female admitted 02/08/2015 from [facility] found to have common femoral vein DVT.  Patient to resume heparin and start Coumadin 02/07/2015 after having left thigh shuntogram w/ angioplasty 02/06/2015.  HL 0.64 yesterday afternoon drawn after HD. RN called to say MD instructed her to hold IV heparin now and for 2 hours due to graft site bleeding. Dc'd current order and re-timed for 11pm. Restarted 1200 units/hr at 2300. HL 0.34 this AM at current rate. INR 1.14>1.18 after 5mg  warfarin dose yesterday. Hgb stable at 8.5, plt 151. No further bleeding noted.   Goal of Therapy:  INR 2-3 Heparin level 0.3-0.7 units/ml Monitor platelets by anticoagulation protocol: Yes   Plan:  Cont heparin 1200 units/hr Warfarin 7.5mg  PO x1 tonight 8hr confirmatory HL Per renal, stop Plavix when INR therapeutic Daily heparin level, INR, CBC Monitor for s/sx of bleeding  Stephens November, PharmD. PGY1 Resident Pager: [phone]  02/09/2015 8:31 AM

## 2015-02-10 ENCOUNTER — Inpatient Hospital Stay (HOSPITAL_COMMUNITY): Payer: Medicare Other | Admitting: Physical Therapy

## 2015-02-10 LAB — PROTIME-INR
INR: 1.38 (ref 0.00–1.49)
Prothrombin Time: 17 seconds — ABNORMAL HIGH (ref 11.6–15.2)

## 2015-02-10 LAB — CBC
HCT: 26.8 % — ABNORMAL LOW (ref 36.0–46.0)
Hemoglobin: 8.8 g/dL — ABNORMAL LOW (ref 12.0–15.0)
MCH: 32.6 pg (ref 26.0–34.0)
MCHC: 32.8 g/dL (ref 30.0–36.0)
MCV: 99.3 fL (ref 78.0–100.0)
PLATELETS: 133 10*3/uL — AB (ref 150–400)
RBC: 2.7 MIL/uL — AB (ref 3.87–5.11)
RDW: 16.2 % — AB (ref 11.5–15.5)
WBC: 4.1 10*3/uL (ref 4.0–10.5)

## 2015-02-10 LAB — GLUCOSE, CAPILLARY
GLUCOSE-CAPILLARY: 171 mg/dL — AB (ref 65–99)
GLUCOSE-CAPILLARY: 251 mg/dL — AB (ref 65–99)
Glucose-Capillary: 137 mg/dL — ABNORMAL HIGH (ref 65–99)
Glucose-Capillary: 176 mg/dL — ABNORMAL HIGH (ref 65–99)

## 2015-02-10 LAB — HEPARIN LEVEL (UNFRACTIONATED): HEPARIN UNFRACTIONATED: 0.44 [IU]/mL (ref 0.30–0.70)

## 2015-02-10 MED ORDER — INSULIN GLARGINE 100 UNIT/ML ~~LOC~~ SOLN
5.0000 [IU] | Freq: Every day | SUBCUTANEOUS | Status: DC
  Administered 2015-02-10: 5 [IU] via SUBCUTANEOUS
  Filled 2015-02-10 (×2): qty 0.05

## 2015-02-10 MED ORDER — WARFARIN SODIUM 5 MG PO TABS
10.0000 mg | ORAL_TABLET | Freq: Once | ORAL | Status: AC
Start: 2015-02-10 — End: 2015-02-10
  Administered 2015-02-10: 10 mg via ORAL
  Filled 2015-02-10: qty 2

## 2015-02-10 NOTE — Progress Notes (Signed)
ANTICOAGULATION CONSULT NOTE - Follow Up Consult  Pharmacy Consult for heparin/warfarin Indication: DVT  No Known Allergies  Patient Measurements: Weight: 184 lb 9.6 oz (83.734 kg) Heparin Dosing Weight:   Vital Signs: Temp: 98.3 F (36.8 C) (11/27 0522) Temp Source: Oral (11/27 0522) BP: 136/55 mmHg (11/27 0522) Pulse Rate: 60 (11/27 0522)  Labs:  Recent Labs  02/08/15 0251 02/08/15 0900  02/09/15 0520 02/09/15 1720 02/10/15 0532 02/10/15 0543  HGB 8.5*  --   --  8.7*  --   --  8.8*  HCT 26.8*  --   --  27.0*  --   --  26.8*  PLT 144*  --   --  151  --   --  133*  LABPROT 14.8  --   --  15.2  --  17.0*  --   INR 1.14  --   --  1.18  --  1.38  --   HEPARINUNFRC 0.40  --   < > 0.34 0.56 0.44  --   CREATININE  --  9.45*  --   --   --   --   --   < > = values in this interval not displayed.  Estimated Creatinine Clearance: 5.9 mL/min (by C-G formula based on Cr of 9.45).   Medications:  Scheduled:  . calcitRIOL  1.5 mcg Oral Q T,Th,Sa-HD  . calcium acetate  667 mg Oral TID WC  . carvedilol  3.125 mg Oral BID WC  . cinacalcet  90 mg Oral Q breakfast  . clopidogrel  75 mg Oral Daily  . [START ON 02/16/2015] darbepoetin (ARANESP) injection - DIALYSIS  60 mcg Intravenous Q Sat-HD  . docusate sodium  100 mg Oral BID  . gabapentin  300 mg Oral QHS  . insulin aspart  0-15 Units Subcutaneous TID WC  . insulin aspart  0-5 Units Subcutaneous QHS  . insulin glargine  5 Units Subcutaneous QHS  . ketoconazole  1 application Topical Daily  . latanoprost  1 drop Left Eye QHS  . lisinopril  20 mg Oral Daily  . multivitamin  1 tablet Oral QHS  . ofloxacin  1 drop Both Eyes QID  . pantoprazole  40 mg Oral Daily  . polyethylene glycol  17 g Oral Daily  . pravastatin  40 mg Oral Daily  . rOPINIRole  0.5 mg Oral QHS  . warfarin  10 mg Oral ONCE-1800  . Warfarin - Pharmacist Dosing Inpatient   Does not apply q1800   Infusions:  . heparin 1,200 Units/hr (02/10/15 0001)     Assessment: HL therapeutic at 0.44 with current rate of 1200 units/hr. INR 1.18>1.38 after receiving 7.5mg  yesterday. Hgb 8.8, plt 133. No bleeding reported  Goal of Therapy:  INR 2-3 Heparin level 0.3-0.7 units/ml Monitor platelets by anticoagulation protocol: Yes   Plan:  Cont heparin 1200 units/hr Coumadin 10mg  x1 Per renal, stop Plavix when INR therapeutic Daily heparin level, INR, CBC Monitor for s/sx of bleeding  Judieth Keens, PharmD. PGY1 Resident 02/10/2015,7:57 AM

## 2015-02-10 NOTE — Progress Notes (Signed)
Physical Therapy Session Note  Patient Details  Name: Stefanie [name] MRN: [id number] Date of Birth: 1945/04/30  Today's Date: 02/10/2015 PT Individual Time: 1510-1540 PT Individual Time Calculation (min): 30 min   Short Term Goals: Week 1:  PT Short Term Goal 1 (Week 1): Pt will ambulate with one person assist x 20' PT Short Term Goal 2 (Week 1): Pt will demonstrate all bed mobility with supervision PT Short Term Goal 3 (Week 1): Pt will demonstrate car transfers with CGA.  PT Short Term Goal 4 (Week 1): Pt will self propel manual w/c x 40' with min A.   Skilled Therapeutic Interventions/Progress Updates:   Session focused on functional transfers, wheelchair mobility, ambulation, and activity tolerance. Patient in recliner, performed sit <> stand after multiple unsuccessful attempts with max A and max multimodal cues for hand/foot placement, sequencing, and technique and transferred to wheelchair using rollator with max A. Patient propelled wheelchair using BUE x 80 ft with mod A due to RUE weakness with cues for efficient propulsion technique. Performed sit > stand from wheelchair with max A and worked on sit <> stand transfer training x 5 from slightly raised mat using rollator with min A and max multimodal cues for upright posture in standing. Gait using rollator x 115 ft with min guard overall and +2A for IV pole and wheelchair follow. Patient left sitting in recliner with all needs within reach.    Therapy Documentation Precautions:  Precautions Precautions: Fall Precaution Comments: history of B CVAs Restrictions Weight Bearing Restrictions: No Pain: Pain Assessment Pain Assessment: No/denies pain  See Function Navigator for Current Functional Status.   Therapy/Group: Individual Therapy  Laretta Alstrom 02/10/2015, 4:05 PM

## 2015-02-10 NOTE — Progress Notes (Signed)
[facility]IC[facility] & REHABILITATION     PROGRESS NOTE    Subjective/Complaints: No new issues yesterday. Therapies went well--"they weren't too much for me!"  ROS: Pt denies fever, rash/itching, headache, blurred or double vision, nausea, vomiting, abdominal pain, diarrhea, chest pain, shortness of breath, palpitations, dysuria, dizziness, neck or back pain,   anxiety, or depression  Objective: Vital Signs: Blood pressure 136/55, pulse 60, temperature 98.3 F (36.8 C), temperature source Oral, resp. rate 16, weight 83.734 kg (184 lb 9.6 oz), SpO2 100 %. No results found.  Recent Labs  02/09/15 0520 02/10/15 0543  WBC 4.1 4.1  HGB 8.7* 8.8*  HCT 27.0* 26.8*  PLT 151 133*    Recent Labs  02/08/15 0900  NA 133*  K 4.6  CL 97*  GLUCOSE 179*  BUN 50*  CREATININE 9.45*  CALCIUM 9.7   CBG (last 3)   Recent Labs  02/09/15 1635 02/09/15 2008 02/10/15 0654  GLUCAP 168* 212* 137*    Wt Readings from Last 3 Encounters:  02/10/15 83.734 kg (184 lb 9.6 oz)  02/08/15 82.4 kg (181 lb 10.5 oz)  03/23/14 88.451 kg (195 lb)    Physical Exam:  Constitutional: She is oriented to person, place, and time. She appears well-developed and well-nourished.  HENT:  Head: Normocephalic and atraumatic.  Mouth/Throat: Oropharynx is clear and moist.  Eyes: Conjunctivae are normal. Pupils are equal, round, and reactive to light.  Neck: Normal range of motion. Neck supple.  Cardiovascular: Normal rate and regular rhythm.  No murmur heard. Respiratory: Effort normal and breath sounds normal. No respiratory distress. She has no wheezes.  GI: Soft. She exhibits no distension. There is no tenderness.  Musculoskeletal: She exhibits edema (min edema left thigh). She exhibits no tenderness.  Left thigh/AVG---removed dressing and small, pin-point area oozing with bright red blood. No pain with palpation of LLE. Has some decreased sensation RLE.  Neurological: She is alert and  oriented to person, place, and time.  Speech dysarthric (edentulous) and able to follow basic commands without difficulty. Sensory deficits RLE? Does sense pain in foot/ankle area. UE: 4/5 prox to distal. LE: 2/5LLE with hf, LLE KE and ADF/APF 3/5. RLE: 3/5 hf, 4/5 ke and adf/apf  Skin: Skin is warm and dry. No rash noted. No erythema.  Psychiatric: She has a normal mood and affect. Her behavior is normal. Thought content normal.   Assessment/Plan: 1. Weakness and balance deficits secondary to multiple medical issues/debility which require 3+ hours per day of interdisciplinary therapy in a comprehensive inpatient rehab setting. Physiatrist is providing close team supervision and 24 hour management of active medical problems listed below. Physiatrist and rehab team continue to assess barriers to discharge/monitor patient progress toward functional and medical goals.  Function:  Bathing Bathing position   Position: Sitting EOB  Bathing parts Body parts bathed by patient: Right arm, Left arm, Chest, Abdomen, Front perineal area, Buttocks, Right upper leg, Left upper leg, Right lower leg, Left lower leg Body parts bathed by helper: Back  Bathing assist Assist Level: Touching or steadying assistance(Pt > 75%)      Upper Body Dressing/Undressing Upper body dressing   What is the patient wearing?: Pull over shirt/dress     Pull over shirt/dress - Perfomed by patient: Thread/unthread right sleeve, Thread/unthread left sleeve, Put head through opening, Pull shirt over trunk          Upper body assist Assist Level: Set up   Set up : To obtain clothing/put  away  Lower Body Dressing/Undressing Lower body dressing   What is the patient wearing?: Socks, Shoes, Non-skid slipper socks, Underwear, Pants Underwear - Performed by patient: Thread/unthread right underwear leg, Thread/unthread left underwear leg, Pull underwear up/down   Pants- Performed by patient: Thread/unthread right pants leg,  Thread/unthread left pants leg, Pull pants up/down, Fasten/unfasten pants   Non-skid slipper socks- Performed by patient: Don/doff right sock, Don/doff left sock   Socks - Performed by patient: Don/doff right sock, Don/doff left sock   Shoes - Performed by patient: Don/doff right shoe, Don/doff left shoe, Fasten right, Fasten left            Lower body assist Assist for lower body dressing: Touching or steadying assistance (Pt > 75%)      Toileting Toileting   Toileting steps completed by patient: Performs perineal hygiene, Adjust clothing after toileting, Adjust clothing prior to toileting      Toileting assist Assist level: Touching or steadying assistance (Pt.75%)   Transfers Chair/bed transfer   Chair/bed transfer method: Stand pivot Chair/bed transfer assist level: Touching or steadying assistance (Pt > 75%) Chair/bed transfer assistive device: Medical sales representative     Max distance: 22' Assist level: 2 helpers   Wheelchair   Type: Manual Max wheelchair distance: 40' Assist Level: Moderate assistance (Pt 50 - 74%)  Cognition Comprehension Comprehension assist level: Understands complex 90% of the time/cues 10% of the time  Expression Expression assist level: Expresses basic 75 - 89% of the time/requires cueing 10 - 24% of the time. Needs helper to occlude trach/needs to repeat words.  Social Interaction Social Interaction assist level: Interacts appropriately 90% of the time - Needs monitoring or encouragement for participation or interaction.  Problem Solving Problem solving assist level: Solves basic 90% of the time/requires cueing < 10% of the time  Memory Memory assist level: Recognizes or recalls 50 - 74% of the time/requires cueing 25 - 49% of the time   Medical Problem List and Plan: 1. Functional deficits secondary to Debility with weakness and balance deficits  2. LLE DVT Prophylaxis/Anticoagulation: Pharmaceutical: Coumadin with heparin  bridge continues. Stop plavix once coumadin therapeutic 3. Pain Management: On Neurontin at nights for neuropathy? Continue tylenol prn 4. Mood: LCSW to follow up for evaluation and support.  5. Neuropsych: This patient is capable of making decisions on her own behalf. 6. Skin/Wound Care: Routine pressure relief measures 7. Fluids/Electrolytes/Nutrition: Continue 1200 cc FR daily.  8. CAD: Treat medically and monitor for symptoms/tolerance with increase in activity. 9. HTN: monitor BP bid on lisinopril and coreg.  10. ESRD: Nephrology to follow for assistance. Continue HD MWF in afternoons to help with tolerance of therapy during the day.  -AVG site still using despite pressure dressing---may need re-visiting by HD team/nephrologist---continue current dressing for now.   -may need to stop plavix now 11. DM type 2: Used novolog bid prn at home. May benefit from scheduled insulin dosing. Will follow for pattern and Continue to monitor BS ac/hs and use SSI for elevated BS.  -will introduce HS lantus starting today  12. Anemia of chronic disease: On Aranesp. Monitor and transfuse prn symptoms or Hgb less than 7.0--hjgb 8.8 today 13. Thrombocytopenia: Monitoring closely with heparin on board. 133 today. I personally reviewed the patient's labs today.   14. PVD: On plavix due to multiple stents left iliac vein. --see above LOS (Days) 2 A FACE TO FACE EVALUATION WAS PERFORMED  Corley Kohls T 02/10/2015 7:50 AM

## 2015-02-11 ENCOUNTER — Inpatient Hospital Stay (HOSPITAL_COMMUNITY): Payer: Medicare Other | Admitting: Occupational Therapy

## 2015-02-11 ENCOUNTER — Inpatient Hospital Stay (HOSPITAL_COMMUNITY): Payer: Medicare Other | Admitting: Physical Therapy

## 2015-02-11 DIAGNOSIS — I6930 Unspecified sequelae of cerebral infarction: Secondary | ICD-10-CM

## 2015-02-11 LAB — GLUCOSE, CAPILLARY
GLUCOSE-CAPILLARY: 152 mg/dL — AB (ref 65–99)
GLUCOSE-CAPILLARY: 103 mg/dL — AB (ref 65–99)
GLUCOSE-CAPILLARY: 210 mg/dL — AB (ref 65–99)
Glucose-Capillary: 179 mg/dL — ABNORMAL HIGH (ref 65–99)

## 2015-02-11 LAB — CBC
HEMATOCRIT: 25.8 % — AB (ref 36.0–46.0)
HEMOGLOBIN: 8.6 g/dL — AB (ref 12.0–15.0)
MCH: 33.1 pg (ref 26.0–34.0)
MCHC: 33.3 g/dL (ref 30.0–36.0)
MCV: 99.2 fL (ref 78.0–100.0)
Platelets: 167 10*3/uL (ref 150–400)
RBC: 2.6 MIL/uL — ABNORMAL LOW (ref 3.87–5.11)
RDW: 16.6 % — ABNORMAL HIGH (ref 11.5–15.5)
WBC: 4.1 10*3/uL (ref 4.0–10.5)

## 2015-02-11 LAB — PROTIME-INR
INR: 1.89 — ABNORMAL HIGH (ref 0.00–1.49)
Prothrombin Time: 21.6 seconds — ABNORMAL HIGH (ref 11.6–15.2)

## 2015-02-11 LAB — HEPARIN LEVEL (UNFRACTIONATED): Heparin Unfractionated: 0.34 IU/mL (ref 0.30–0.70)

## 2015-02-11 MED ORDER — INSULIN GLARGINE 100 UNIT/ML ~~LOC~~ SOLN
5.0000 [IU] | Freq: Every day | SUBCUTANEOUS | Status: DC
  Administered 2015-02-11 – 2015-02-13 (×3): 5 [IU] via SUBCUTANEOUS
  Filled 2015-02-11 (×4): qty 0.05

## 2015-02-11 MED ORDER — INSULIN ASPART 100 UNIT/ML ~~LOC~~ SOLN
0.0000 [IU] | Freq: Every day | SUBCUTANEOUS | Status: DC
  Administered 2015-02-11 – 2015-02-20 (×3): 2 [IU] via SUBCUTANEOUS

## 2015-02-11 MED ORDER — INSULIN GLARGINE 100 UNIT/ML ~~LOC~~ SOLN: 10.0000 [IU] | Freq: Every day | SUBCUTANEOUS | Status: DC

## 2015-02-11 MED ORDER — DARBEPOETIN ALFA 100 MCG/0.5ML IJ SOSY
100.0000 ug | PREFILLED_SYRINGE | INTRAMUSCULAR | Status: DC
  Administered 2015-02-12 – 2015-02-25 (×3): 100 ug via INTRAVENOUS
  Filled 2015-02-11 (×2): qty 0.5

## 2015-02-11 MED ORDER — WARFARIN SODIUM 7.5 MG PO TABS
7.5000 mg | ORAL_TABLET | Freq: Every day | ORAL | Status: DC
  Administered 2015-02-11: 7.5 mg via ORAL
  Filled 2015-02-11: qty 1

## 2015-02-11 MED ORDER — INSULIN ASPART 100 UNIT/ML ~~LOC~~ SOLN
0.0000 [IU] | Freq: Three times a day (TID) | SUBCUTANEOUS | Status: DC
  Administered 2015-02-11: 3 [IU] via SUBCUTANEOUS
  Administered 2015-02-12 – 2015-02-13 (×2): 1 [IU] via SUBCUTANEOUS
  Administered 2015-02-14: 2 [IU] via SUBCUTANEOUS
  Administered 2015-02-14 – 2015-02-15 (×2): 1 [IU] via SUBCUTANEOUS
  Administered 2015-02-15: 3 [IU] via SUBCUTANEOUS
  Administered 2015-02-16 – 2015-02-17 (×4): 1 [IU] via SUBCUTANEOUS
  Administered 2015-02-18: 3 [IU] via SUBCUTANEOUS
  Administered 2015-02-22: 2 [IU] via SUBCUTANEOUS
  Administered 2015-02-23 – 2015-02-25 (×5): 1 [IU] via SUBCUTANEOUS

## 2015-02-11 MED ORDER — LISINOPRIL 20 MG PO TABS
20.0000 mg | ORAL_TABLET | Freq: Every day | ORAL | Status: DC
  Administered 2015-02-11: 20 mg via ORAL
  Filled 2015-02-11: qty 1

## 2015-02-11 NOTE — Progress Notes (Signed)
Patient information reviewed and entered into eRehab system by Savalas Monje, RN, CRRN, PPS Coordinator.  Information including medical coding and functional independence measure will be reviewed and updated through discharge.     Per nursing patient was given "Data Collection Information Summary for Patients in Inpatient Rehabilitation Facilities with attached "Privacy Act Statement-Health Care Records" upon admission.  

## 2015-02-11 NOTE — Progress Notes (Signed)
ANTICOAGULATION CONSULT NOTE - Follow Up Consult  Pharmacy Consult for heparin Indication: DVT  No Known Allergies  Patient Measurements: Weight: 182 lb 12.2 oz (82.9 kg) Heparin Dosing Weight:   Vital Signs: Temp: 98.4 F (36.9 C) (11/28 0548) Temp Source: Oral (11/28 0548) BP: 144/63 mmHg (11/28 0548) Pulse Rate: 71 (11/28 0548)  Labs:  Recent Labs  02/09/15 0520 02/09/15 1720 02/10/15 0532 02/10/15 0543 02/11/15 0720  HGB 8.7*  --   --  8.8* 8.6*  HCT 27.0*  --   --  26.8* 25.8*  PLT 151  --   --  133* 167  LABPROT 15.2  --  17.0*  --  21.6*  INR 1.18  --  1.38  --  1.89*  HEPARINUNFRC 0.34 0.56 0.44  --  0.34    Estimated Creatinine Clearance: 5.9 mL/min (by C-G formula based on Cr of 9.45).   Medications:  Scheduled:  . calcitRIOL  1.5 mcg Oral Q T,Th,Sa-HD  . calcium acetate  667 mg Oral TID WC  . carvedilol  3.125 mg Oral BID WC  . cinacalcet  90 mg Oral Q breakfast  . clopidogrel  75 mg Oral Daily  . [START ON 02/16/2015] darbepoetin (ARANESP) injection - DIALYSIS  60 mcg Intravenous Q Sat-HD  . docusate sodium  100 mg Oral BID  . gabapentin  300 mg Oral QHS  . insulin aspart  0-15 Units Subcutaneous TID WC  . insulin aspart  0-5 Units Subcutaneous QHS  . insulin glargine  5 Units Subcutaneous QHS  . ketoconazole  1 application Topical Daily  . latanoprost  1 drop Left Eye QHS  . lisinopril  20 mg Oral QPC supper  . multivitamin  1 tablet Oral QHS  . ofloxacin  1 drop Both Eyes QID  . pantoprazole  40 mg Oral Daily  . polyethylene glycol  17 g Oral Daily  . pravastatin  40 mg Oral Daily  . rOPINIRole  0.5 mg Oral QHS  . Warfarin - Pharmacist Dosing Inpatient   Does not apply q1800   Infusions:  . heparin 1,200 Units/hr (02/10/15 0001)    Assessment: 69 yo female with DVT is currently on D#5 heparin bridge to coumadin. Heparin level = 0.34, therapeutic on 1200 un. INR 1,38 > 1.89. hgb 8.6, plt 167K, stable. No bleeding noted per chart.  Goal  of Therapy:  INR = 2-3 Heparin level 0.3-0.7 units/ml Monitor platelets by anticoagulation protocol: Yes   Plan:  - Coumadin 7.5 mg po daily - continue heparin at 1200 units/hr - daily heparin level and CBC  Maryanna Shape, PharmD, BCPS  Clinical Pharmacist  Pager: [phone]   02/11/2015,11:06 AM

## 2015-02-11 NOTE — Progress Notes (Signed)
Meredith Staggers, MD Physician Signed Physical Medicine and Rehabilitation Consult Note 02/08/2015 8:16 AM  Related encounter: Admission (Discharged) from 02/01/2015 in [place] Collapse [facility]        Physical Medicine and Rehabilitation Consult  Reason for Consult: Debilty Referring Physician: Dr. Tana Coast.    HPI: Stefanie [name] is a 69 y.o. female with history fo HTN, DM type 2 with nephropathy, ESRD, CAD who was admitted from [facility] on 02/01/15 with SOB, left leg discomfort with edema and Left proximal femoral DVT. CTA lungs negative for PE and she was started on IV heparin. Dr. Cathie Olden consulted for input due to progressive SOB limiting activity. Myoview done showing high risk with mixed infarct and ischemia therefore cath recommended for workup. Cardiac cath showed left ventricular EF < 25% with global hypokinesis and 100% stenosis 1st Diag lesion, 100% stenosed. Mid RCA lesion, 100% stenosed. faint left to right collaterals are present. To be treated medically per Dr. Burt Knack. Patient with history of left thigh AVG and Dr. Scot Dock consulted for input. 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 Oneida Alar on 11/23. PT evaluation done post procedure and CIR recommended due to deconditioned state, decreased balance, poor safety and inability to walk.    Review of Systems  HENT: Negative for hearing loss.  Eyes: Negative for blurred vision and double vision.  Respiratory: Positive for shortness of breath (chronic problem limiting acitvity for the past few months). Negative for cough and sputum production.  Cardiovascular: Negative for chest pain and palpitations.  Gastrointestinal: Positive for constipation. Negative for heartburn, nausea and diarrhea.  Musculoskeletal: Negative for myalgias, back pain and joint pain.  Skin: Negative for  itching and rash.  Neurological: Positive for weakness. Negative for dizziness, tingling and headaches.  Psychiatric/Behavioral: The patient is not nervous/anxious and does not have insomnia.      Past Medical History  Diagnosis Date  . Hypertension   . Hyperlipidemia   . Diabetes mellitus   . CVA (cerebral vascular accident) ([place]) [place]    left sided weakness  . Protein malnutrition ([place])   . Secondary hyperparathyroidism ([place])   . Anemia   . End stage renal disease ([place])     initiated HD 2006  . Hx of echocardiogram     a. Echo 03/2010: EF 55-60%, Gr 1 diast dysfn, trivial MS, mild to mod LAE, PASP 34, ; b. Echo 11/13: mild LVH, EF 60%, Gr 1 diast dysfn, Ao sclerosis, no AS, MAC, slight MS, mean 3 mmHg, PASP 34  . Hx of cardiovascular stress test     a. MV 03/2010: no ischemia, EF 55%  . CHF (congestive heart failure) ([place])   . Retinal detachment, bilateral   . CAD (coronary artery disease)     Past Surgical History  Procedure Laterality Date  . Cataract extraction    . Tubal ligation    . Arteriovenous graft placement      BUE numerous times  . Arteriovenous graft placement  11/13/10    Lt femoral - Dr. Kellie Simmering  . Dg av dialysis graft declot or  06/06/11, 08/26/11    performed in IR  . Shuntogram N/A 05/29/2013    Procedure: Earney Mallet; Surgeon: Conrad Acton, MD; Location: [facility] CATH LAB; Service: Cardiovascular; Laterality: N/A;  . Shuntogram Left 03/01/2014    Procedure: SHUNTOGRAM; Surgeon: Conrad [place] Pines,  MD; Location: [facility] CATH LAB; Service: Cardiovascular; Laterality: Left;  . Cardiac catheterization N/A 02/05/2015    Procedure: Left Heart Cath and Coronary Angiography; Surgeon: Jettie Booze, MD; Location: [facility] CV LAB; Service: Cardiovascular; Laterality: N/A;  . Peripheral vascular catheterization N/A 02/06/2015    Procedure:  A/V Shuntogram; Surgeon: Elam Dutch, MD; Location: [facility] CV LAB; Service: Cardiovascular; Laterality: N/A;    Family History  Problem Relation Age of Onset  . Cardiomyopathy    . Heart disease Mother   . Diabetes Mother   . Hypertension Mother   . Heart attack Mother   . Diabetes Other   . Kidney disease Other   . Diabetes Daughter   . Diabetes Son   . Hypertension Son     Social History: Lives alone and independent with use of walker. Has an aide 2-3 hours/7 days a week to help with Meals/housework. She reports that she quit smoking about 31 years ago. She has never used smokeless tobacco. She reports that she does not drink alcohol or use illicit drugs.    Allergies: No Known Allergies    Medications Prior to Admission  Medication Sig Dispense Refill  . calcium acetate (ELIPHOS) 667 MG tablet Take 667 mg by mouth 3 (three) times daily with meals. Take 3 capsule in the morning, 2 capsules with a snack and 3 capsules in the evening.    . celecoxib (CELEBREX) 200 MG capsule Take 200 mg by mouth daily as needed.  1  . cinacalcet (SENSIPAR) 30 MG tablet Take 30 mg by mouth at bedtime.    . cinacalcet (SENSIPAR) 90 MG tablet Take 90 mg by mouth daily.    . folic acid-vitamin b complex-vitamin c-selenium-zinc (DIALYVITE) 3 MG TABS tablet Take 1 tablet by mouth daily.    Marland Kitchen gabapentin (NEURONTIN) 300 MG capsule Take 300 mg by mouth at bedtime.    . insulin aspart (NOVOLOG) 100 UNIT/ML injection Inject 14 Units into the skin 2 (two) times daily as needed for high blood sugar.    . latanoprost (XALATAN) 0.005 % ophthalmic solution Place 1 drop into the left eye at bedtime.    Marland Kitchen lisinopril (PRINIVIL,ZESTRIL) 20 MG tablet Take 20 mg by mouth daily.     Marland Kitchen lisinopril (PRINIVIL,ZESTRIL) 30 MG tablet Take 30 mg by mouth daily.    Marland Kitchen omeprazole (PRILOSEC) 40 MG capsule Take 40  mg by mouth daily.    . pravastatin (PRAVACHOL) 40 MG tablet Take 40 mg by mouth daily.     Marland Kitchen PROAIR HFA 108 (90 BASE) MCG/ACT inhaler Inhale 1 puff into the lungs every 4 (four) hours as needed for wheezing or shortness of breath.     . silver sulfADIAZINE (SILVADENE) 1 % cream APPLY A 1/16 INCH (1.5 MM) THICK LAYER TO ENTIRE BURN AREA BY TOPICAL ROUTE ONCE DAILY  0  . traMADol (ULTRAM) 50 MG tablet Take 50 mg by mouth every 6 (six) hours as needed for moderate pain.    Marland Kitchen VOLTAREN 1 % GEL APPLY 2 GRAMS TO AREA UP TO 4 TIMES A DAY  2  . diphenoxylate-atropine (LOMOTIL) 2.5-0.025 MG per tablet   0  . ketoconazole (NIZORAL) 2 % cream Apply 1 application topically daily. 15 g 0  . levofloxacin (LEVAQUIN) 750 MG tablet   0  . ofloxacin (OCUFLOX) 0.3 % ophthalmic solution     . rOPINIRole (REQUIP) 0.5 MG tablet Take 0.5 mg by mouth at bedtime.  12    Home: Home  Living Family/patient expects to be discharged to:: Private residence Living Arrangements: Alone Available Help at Discharge: Personal care attendant (M-Sunday 2.5 every day except monday 3 hours) Type of Home: Apartment Home Access: Elevator Home Layout: One level Bathroom Shower/Tub: Multimedia programmer: Standard (3n1 over toilet) Home Equipment: Walker - 4 wheels, Shower seat Additional Comments: pt has an aide 7 days a week for 2.5 hours except monday she has 3 hours (A). Pt otherwise alone per patient  Functional History: Prior Function Level of Independence: Independent with assistive device(s) Functional Status:  Mobility: Bed Mobility Overal bed mobility: Needs Assistance Bed Mobility: Supine to Sit Supine to sit: Supervision General bed mobility comments: HOB elevated, cues to push from bed, required use of bed rail Transfers Overall transfer level: Needs assistance Equipment used: 4-wheeled walker Transfers: Sit to/from Stand, Stand Pivot  Transfers Sit to Stand: Min assist General transfer comment: cues for hand and feet placement, required min assist to power up. Needed two trials to stand, second trial was successful when feet moved closer to bed. Pt required VCs to stand straight and lift head during stand pivot      ADL: ADL Overall ADL's : Needs assistance/impaired Eating/Feeding: NPO Grooming: Wash/dry face, Supervision/safety Lower Body Dressing: Moderate assistance, Sit to/from stand Lower Body Dressing Details (indicate cue type and reason): pt able to doff socks but difficulty applying socks Toilet Transfer: Maximal assistance, RW, Ambulation Toilet Transfer Details (indicate cue type and reason): cues for hand placement and heavy use of grab bar. Pt unable to complete sit<>stand from low surface Toileting- Clothing Manipulation and Hygiene: Maximal assistance Functional mobility during ADLs: Moderate assistance General ADL Comments: Pt requires (A) with sit<>Stand and use of 4ww. pt currently with 4ww brakes lose and not locking. Pt with DOE and requires rest break between transfers. Pt with HR 80s with movement  Cognition: Cognition Overall Cognitive Status: Within Functional Limits for tasks assessed Orientation Level: Oriented X4 Cognition Arousal/Alertness: Awake/alert Behavior During Therapy: WFL for tasks assessed/performed Overall Cognitive Status: Within Functional Limits for tasks assessed   Blood pressure 115/81, pulse 57, temperature 98.5 F (36.9 C), temperature source Oral, resp. rate 18, height 5\' 4"  (1.626 m), weight 79.379 kg (175 lb), SpO2 100 %. Physical Exam  Nursing note and vitals reviewed. Constitutional: She is oriented to person, place, and time. She appears well-developed and well-nourished.  Seen in HD unit--lying in bed receiving dialysis via left thigh AVG.  HENT:  Head: Normocephalic and atraumatic.  Mouth/Throat: Oropharynx is clear and moist.  Eyes: Conjunctivae are  normal. Pupils are equal, round, and reactive to light. Right eye exhibits no discharge. Left eye exhibits no discharge.  Neck: Normal range of motion. Neck supple.  Cardiovascular: Normal rate and regular rhythm.  No murmur heard. Respiratory: Effort normal and breath sounds normal. No respiratory distress. She has no wheezes. She exhibits no tenderness.  GI: Soft. Bowel sounds are normal. She exhibits no distension. There is no tenderness.  Musculoskeletal: She exhibits edema (min edema left thigh). She exhibits no tenderness.  mild pain with palpation left thigh or left calf. Moves BUE and RLE without difficulty. AVF Left groin  Neurological: She is alert and oriented to person, place, and time.  Speech clear and able to follow basic commands without difficulty. Sensory deficits RLE? Does sense pain in foot/ankle area. UE: 4/5 prox to distal. LE: 2/5LLE with hf, LLE KE and ADF/APF 3/5. RLE: 3/5 hf, 4/5 ke and adf/apf.  Skin: Skin is warm  and dry.  Psychiatric: She has a normal mood and affect. Her behavior is normal. Thought content normal.     Lab Results Last 24 Hours    Results for orders placed or performed during the hospital encounter of 02/01/15 (from the past 24 hour(s))  Glucose, capillary Status: Abnormal   Collection Time: 02/07/15 11:56 AM  Result Value Ref Range   Glucose-Capillary 182 (H) 65 - 99 mg/dL   Comment 1 Notify RN    Comment 2 Document in Chart   Heparin level (unfractionated) Status: Abnormal   Collection Time: 02/07/15 3:55 PM  Result Value Ref Range   Heparin Unfractionated 0.28 (L) 0.30 - 0.70 IU/mL  Glucose, capillary Status: Abnormal   Collection Time: 02/07/15 4:38 PM  Result Value Ref Range   Glucose-Capillary 252 (H) 65 - 99 mg/dL   Comment 1 Notify RN    Comment 2 Document in Chart   Glucose, capillary Status: Abnormal   Collection Time: 02/07/15 8:52 PM  Result Value  Ref Range   Glucose-Capillary 182 (H) 65 - 99 mg/dL  CBC Status: Abnormal   Collection Time: 02/08/15 2:51 AM  Result Value Ref Range   WBC 5.2 4.0 - 10.5 K/uL   RBC 2.68 (L) 3.87 - 5.11 MIL/uL   Hemoglobin 8.5 (L) 12.0 - 15.0 g/dL   HCT 26.8 (L) 36.0 - 46.0 %   MCV 100.0 78.0 - 100.0 fL   MCH 31.7 26.0 - 34.0 pg   MCHC 31.7 30.0 - 36.0 g/dL   RDW 15.8 (H) 11.5 - 15.5 %   Platelets 144 (L) 150 - 400 K/uL  Heparin level (unfractionated) Status: None   Collection Time: 02/08/15 2:51 AM  Result Value Ref Range   Heparin Unfractionated 0.40 0.30 - 0.70 IU/mL  Protime-INR Status: None   Collection Time: 02/08/15 2:51 AM  Result Value Ref Range   Prothrombin Time 14.8 11.6 - 15.2 seconds   INR 1.14 0.00 - 1.49  Glucose, capillary Status: Abnormal   Collection Time: 02/08/15 7:27 AM  Result Value Ref Range   Glucose-Capillary 142 (H) 65 - 99 mg/dL      Imaging Results (Last 48 hours)    No results found.    Assessment/Plan: Diagnosis: weakness, balance deficits related to debility after multiple medical issues 1. Does the need for close, 24 hr/day medical supervision in concert with the patient's rehab needs make it unreasonable for this patient to be served in a less intensive setting? Yes 2. Co-Morbidities requiring supervision/potential complications: LLE DVT, pad, esrd on HD, htn 3. Due to bladder management, bowel management, safety, skin/wound care, disease management, medication administration, pain management and patient education, does the patient require 24 hr/day rehab nursing? Yes 4. Does the patient require coordinated care of a physician, rehab nurse, PT (1-2 hrs/day, 5 days/week) and OT (1-2 hrs/day, 5 days/week) to address physical and functional deficits in the context of the above medical diagnosis(es)? Yes Addressing deficits in the following areas: balance,  endurance, locomotion, strength, transferring, bowel/bladder control, bathing, dressing, feeding, grooming, toileting and psychosocial support 5. Can the patient actively participate in an intensive therapy program of at least 3 hrs of therapy per day at least 5 days per week? Yes 6. The potential for patient to make measurable gains while on inpatient rehab is excellent 7. Anticipated functional outcomes upon discharge from inpatient rehab are modified independent with PT, modified independent with OT, n/a with SLP. 8. Estimated rehab length of stay to reach the above functional goals  is: 10-14 days 9. Does the patient have adequate social supports and living environment to accommodate these discharge functional goals? Yes and Potentially 10. Anticipated D/C setting: Home 11. Anticipated post D/C treatments: [facility] therapy 12. Overall Rehab/Functional Prognosis: excellent  RECOMMENDATIONS: This patient's condition is appropriate for continued rehabilitative care in the following setting: CIR Patient has agreed to participate in recommended program. Yes Note that insurance prior authorization may be required for reimbursement for recommended care.  Comment: Rehab Admissions Coordinator to follow up.  Thanks,  Meredith Staggers, MD, [facility]     02/08/2015       Revision History     Date/Time User Provider Type Action   02/08/2015 10:50 AM Meredith Staggers, MD Physician Sign   02/08/2015 10:03 AM Bary Leriche, PA-C Physician Assistant Share   View Details Report       Routing History     Date/Time From To Method   02/08/2015 10:50 AM Meredith Staggers, MD Meredith Staggers, MD In Basket   02/08/2015 10:50 AM Meredith Staggers, MD Imran Gertie Baron, MD Fax

## 2015-02-11 NOTE — Progress Notes (Signed)
Retta Diones, RN Rehab Admission Coordinator Signed Physical Medicine and Rehabilitation PMR Pre-admission 02/08/2015 2:47 PM  Related encounter: Admission (Discharged) from 02/01/2015 in [place] [facility]   PMR Admission Coordinator Pre-Admission Assessment  Patient: Stefanie [name] is an 69 y.o., female MRN: [id number] DOB: [birth year] Height: 5\' 4"  (162.6 cm) Weight: 82.4 kg (181 lb 10.5 oz)  Insurance Information HMO: PPO: PCP: IPA: 80/20: OTHER:  PRIMARY: Medicare A/B Policy#: [id number] Subscriber: self CM Name: Phone#: Fax#:  Pre-Cert#: Employer: Retired Benefits: Phone #: Name: Checked in [place]. Date: 09/14/91 Deduct: $1288 Out of Pocket Max: none Life Max: unlimited CIR: 100% SNF: 100 days Outpatient: 80% Co-Pay: 20% Home Health: 100% Co-Pay: none DME: 80% Co-Pay: 20% Providers: patient's choice  SECONDARY: Medicaid of [place] Policy#: [id number] Subscriber: Self CM Name: Phone#: Fax#:  Pre-Cert#: Employer: Retired Benefits: Phone #: [phone] Name: Automated Eff. Date: Eligible 02/08/15 Deduct: Out of Pocket Max: Life Max:  CIR: SNF:  Outpatient: Co-Pay:  Home Health: Co-Pay:  DME: Co-Pay:   Emergency Contact Information Contact Information    Name Relation Home Work Mobile   Mack,Faleica Daughter [phone]     Candie Echevaria [phone]       Current Medical History  Patient Admitting Diagnosis: Debility  History of Present Illness: A 69 y.o. female with history of HTN, DM type 2 with nephropathy, ESRD, CAD who was admitted from [facility] on  02/01/15 with SOB, left leg discomfort with edema and Left proximal femoral DVT. CTA lungs negative for PE and she was started on IV heparin. Dr. Cathie Olden consulted for input due to progressive SOB limiting activity. Myoview done showing high risk with mixed infarct and ischemia therefore cath recommended for workup. Cardiac cath showed left ventricular EF < 25% with global hypokinesis and 100% stenosis 1st Diag lesion, 100% stenosed. Mid RCA lesion, 100% stenosed. faint left to right collaterals are present. To be treated medically per Dr. Burt Knack. Patient with history of left thigh AVG and Dr. Scot Dock consulted for input. 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 Oneida Alar on 11/23. PT evaluation done post procedure and CIR recommended due to deconditioned state, decreased balance, poor safety and inability to walk.    Past Medical History  Past Medical History  Diagnosis Date  . Hypertension   . Hyperlipidemia   . Diabetes mellitus   . CVA (cerebral vascular accident) ([place]) [place]    left sided weakness  . Protein malnutrition ([place])   . Secondary hyperparathyroidism ([place])   . Anemia   . End stage renal disease ([place])     initiated HD 2006  . Hx of echocardiogram     a. Echo 03/2010: EF 55-60%, Gr 1 diast dysfn, trivial MS, mild to mod LAE, PASP 34, ; b. Echo 11/13: mild LVH, EF 60%, Gr 1 diast dysfn, Ao sclerosis, no AS, MAC, slight MS, mean 3 mmHg, PASP 34  . Hx of cardiovascular stress test     a. MV 03/2010: no ischemia, EF 55%  . CHF (congestive heart failure) ([place])   . Retinal detachment, bilateral   . CAD (coronary artery disease)     Family History  family history includes Cardiomyopathy in an other family member; Diabetes in her daughter, mother, other, and son; Heart attack in her mother; Heart disease in her  mother; Hypertension in her mother and  son; Kidney disease in her other.  Prior Rehab/Hospitalizations: No previous rehab admissions.  Has the patient had major surgery during 100 days prior to admission? No  Current Medications   Current facility-administered medications:  . 0.9 % sodium chloride infusion, 100 mL, Intravenous, PRN, Valentina Gu, NP . 0.9 % sodium chloride infusion, 100 mL, Intravenous, PRN, Valentina Gu, NP . acetaminophen (TYLENOL) tablet 325-650 mg, 325-650 mg, Oral, Q4H PRN **OR** acetaminophen (TYLENOL) suppository 325-650 mg, 325-650 mg, Rectal, Q4H PRN, Elam Dutch, MD . albuterol (PROVENTIL) (2.5 MG/3ML) 0.083% nebulizer solution 2.5 mg, 2.5 mg, Inhalation, Q4H PRN, Ivor Costa, MD, 2.5 mg at 02/05/15 2226 . calcitRIOL (ROCALTROL) capsule 1.5 mcg, 1.5 mcg, Oral, Q T,Th,Sa-HD, Alric Seton, PA-C, 1.5 mcg at 02/07/15 1233 . calcium acetate (PHOSLO) capsule 667 mg, 667 mg, Oral, TID WC, Ivor Costa, MD, 667 mg at 02/08/15 0750 . carvedilol (COREG) tablet 3.125 mg, 3.125 mg, Oral, BID WC, Barton Dubois, MD, 3.125 mg at 02/08/15 0750 . celecoxib (CELEBREX) capsule 200 mg, 200 mg, Oral, Daily PRN, Ivor Costa, MD . cinacalcet (SENSIPAR) tablet 90 mg, 90 mg, Oral, Q breakfast, Jettie Booze, MD, 90 mg at 02/08/15 0750 . clopidogrel (PLAVIX) tablet 75 mg, 75 mg, Oral, Daily, Ivor Costa, MD, 75 mg at 02/08/15 1400 . Darbepoetin Alfa (ARANESP) injection 60 mcg, 60 mcg, Intravenous, Q Tue-HD, Alric Seton, PA-C, 60 mcg at 02/05/15 1050 . diclofenac sodium (VOLTAREN) 1 % transdermal gel 2 g, 2 g, Topical, QID PRN, Ivor Costa, MD . docusate sodium (COLACE) capsule 100 mg, 100 mg, Oral, BID, Ripudeep K Rai, MD, 100 mg at 02/08/15 1400 . gabapentin (NEURONTIN) capsule 300 mg, 300 mg, Oral, QHS, Ivor Costa, MD, 300 mg at 02/07/15 2232 . guaiFENesin (MUCINEX) 12 hr tablet 600 mg, 600 mg, Oral, BID PRN, Ivor Costa, MD, 600 mg at 02/08/15 1407 .  heparin ADULT infusion 100 units/mL (25000 units/250 mL), 1,200 Units/hr, Intravenous, Continuous, Durwin Nora, [facility], Last Rate: 12 mL/hr at 02/08/15 0436, 1,200 Units/hr at 02/08/15 0436 . heparin injection 1,000 Units, 1,000 Units, Dialysis, PRN, Valentina Gu, NP . hydrALAZINE (APRESOLINE) injection 5 mg, 5 mg, Intravenous, Q20 Min PRN, Elam Dutch, MD . insulin aspart (novoLOG) injection 0-15 Units, 0-15 Units, Subcutaneous, TID WC, Ripudeep Krystal Eaton, MD, 2 Units at 02/08/15 0753 . insulin aspart (novoLOG) injection 0-5 Units, 0-5 Units, Subcutaneous, QHS, Ripudeep K Rai, MD . ketoconazole (NIZORAL) 2 % cream 1 application, 1 application, Topical, Daily, Ivor Costa, MD, 1 application at [id number] . labetalol (NORMODYNE,TRANDATE) injection 10 mg, 10 mg, Intravenous, Q10 min PRN, Elam Dutch, MD . latanoprost (XALATAN) 0.005 % ophthalmic solution 1 drop, 1 drop, Left Eye, QHS, Jettie Booze, MD, 1 drop at 02/07/15 2233 . lidocaine (PF) (XYLOCAINE) 1 % injection 5 mL, 5 mL, Intradermal, PRN, Valentina Gu, NP . lidocaine-prilocaine (EMLA) cream 1 application, 1 application, Topical, PRN, Valentina Gu, NP . lisinopril (PRINIVIL,ZESTRIL) tablet 20 mg, 20 mg, Oral, Daily, Ivor Costa, MD, 20 mg at 02/08/15 1401 . metoprolol (LOPRESSOR) injection 2-5 mg, 2-5 mg, Intravenous, Q2H PRN, Elam Dutch, MD . morphine 2 MG/ML injection 2 mg, 2 mg, Intravenous, Q4H PRN, Ivor Costa, MD . multivitamin (RENA-VIT) tablet 1 tablet, 1 tablet, Oral, QHS, Ivor Costa, MD, 1 tablet at 02/07/15 2232 . nitroGLYCERIN (NITROSTAT) SL tablet 0.4 mg, 0.4 mg, Sublingual, Q5 min PRN, Ivor Costa, MD . ofloxacin (OCUFLOX) 0.3 % ophthalmic solution 1 drop, 1 drop, Both Eyes,  QID, Ivor Costa, MD, 1 drop at 02/07/15 2233 . ondansetron (ZOFRAN) injection 4 mg, 4 mg, Intravenous, Q6H PRN, Elam Dutch, MD . ondansetron [facility]) tablet 4 mg, 4 mg, Oral, Q6H PRN **OR**  [DISCONTINUED] ondansetron (ZOFRAN) injection 4 mg, 4 mg, Intravenous, Q6H PRN, Ivor Costa, MD . pantoprazole (PROTONIX) EC tablet 40 mg, 40 mg, Oral, Daily, Ivor Costa, MD, 40 mg at 02/08/15 1400 . pentafluoroprop-tetrafluoroeth (GEBAUERS) aerosol 1 application, 1 application, Topical, PRN, Valentina Gu, NP . polyethylene glycol (MIRALAX / GLYCOLAX) packet 17 g, 17 g, Oral, Daily, Barton Dubois, MD, 17 g at 02/07/15 1233 . pravastatin (PRAVACHOL) tablet 40 mg, 40 mg, Oral, Daily, Ivor Costa, MD, 40 mg at 02/07/15 1759 . rOPINIRole (REQUIP) tablet 0.5 mg, 0.5 mg, Oral, QHS, Ivor Costa, MD, 0.5 mg at 02/07/15 2233 . sodium chloride 0.9 % injection 3 mL, 3 mL, Intravenous, Q12H, Ivor Costa, MD, 3 mL at 02/08/15 1402 . traMADol (ULTRAM) tablet 50 mg, 50 mg, Oral, Q6H PRN, Jettie Booze, MD . warfarin (COUMADIN) tablet 5 mg, 5 mg, Oral, ONCE-1800, Ripudeep K Rai, MD . Warfarin - Pharmacist Dosing Inpatient, , Does not apply, q1800, Tegan K Magsam, RPH  Patients Current Diet: Diet renal/carb modified with fluid restriction Diet-HS Snack?: Nothing; Room service appropriate?: Yes; Fluid consistency:: Thin  Precautions / Restrictions Precautions Precautions: Fall Restrictions Weight Bearing Restrictions: No Other Position/Activity Restrictions: Confirmed with RN no longer on bedrest   Has the patient had 2 or more falls or a fall with injury in the past year?Yes. Slid out of bed X 3 in the past year with no injury.  Prior Activity Level Community (5-7x/wk): Went out 4 X a week. Goes to HD t-Th-Sat and to church services.  Home Assistive Devices / Equipment Home Assistive Devices/Equipment: Gilford Rile (specify type) Home Equipment: Walker - 4 wheels, Shower seat  Prior Device Use: Indicate devices/aids used by the patient prior to current illness, exacerbation or injury? Seated walker/Rollator  Prior Functional Level Prior Function Level of Independence: Independent with  assistive device(s)  Self Care: Did the patient need help bathing, dressing, using the toilet or eating? Independent  Indoor Mobility: Did the patient need assistance with walking from room to room (with or without device)? Independent  Stairs: Did the patient need assistance with internal or external stairs (with or without device)? Independent  Functional Cognition: Did the patient need help planning regular tasks such as shopping or remembering to take medications? Independent  Current Functional Level Cognition  Overall Cognitive Status: Within Functional Limits for tasks assessed Orientation Level: Oriented X4   Extremity Assessment (includes Sensation/Coordination)  Upper Extremity Assessment: RUE deficits/detail, LUE deficits/detail RUE Deficits / Details: 110 degrees LUE Deficits / Details: 100 degrees  Lower Extremity Assessment: Generalized weakness    ADLs  Overall ADL's : Needs assistance/impaired Eating/Feeding: NPO Grooming: Wash/dry face, Supervision/safety Lower Body Dressing: Moderate assistance, Sit to/from stand Lower Body Dressing Details (indicate cue type and reason): pt able to doff socks but difficulty applying socks Toilet Transfer: Maximal assistance, RW, Ambulation Toilet Transfer Details (indicate cue type and reason): cues for hand placement and heavy use of grab bar. Pt unable to complete sit<>stand from low surface Toileting- Clothing Manipulation and Hygiene: Maximal assistance Functional mobility during ADLs: Moderate assistance General ADL Comments: Pt requires (A) with sit<>Stand and use of 4ww. pt currently with 4ww brakes lose and not locking. Pt with DOE and requires rest break between transfers. Pt with HR 80s with movement  Mobility  Overal bed mobility: Needs Assistance Bed Mobility: Supine to Sit Supine to sit: Supervision General bed mobility comments: HOB elevated, cues to push from bed, required use of bed rail      Transfers  Overall transfer level: Needs assistance Equipment used: 4-wheeled walker Transfers: Sit to/from Stand Sit to Stand: Min assist General transfer comment: cues for prep to stand    Ambulation / Gait / Stairs / Wheelchair Mobility  Ambulation/Gait Ambulation/Gait assistance: Museum/gallery curator (Feet): 65 Feet Assistive device: 4-wheeled walker Gait Pattern/deviations: Step-through pattern General Gait Details: stable paretic gait with limitations bil, mostly due to stiff swing and foot flat bil and little dissociation from upper and lower trunk Gait velocity: slower Gait velocity interpretation: Below normal speed for age/gender    Posture / Balance Balance Overall balance assessment: Needs assistance Sitting-balance support: No upper extremity supported Sitting balance-Leahy Scale: Fair Postural control: Other (comment) (forward lean in sitting, needed VC to sit up and lift head) Standing balance support: Bilateral upper extremity supported Standing balance-Leahy Scale: Fair Standing balance comment: but generally reliant on the RW or her UE's for support    Special needs/care consideration BiPAP/CPAP No CPM No Continuous Drip IV Heparin drip Dialysis Yes Days T-Th-Sat Life Vest No Oxygen No Special Bed No Trach Size No Wound Vac (area) No  Skin: No  Bowel mgmt: Last BM 02/05/15 Bladder mgmt: Anuria Diabetic mgmt Yes, on insulin at home    Previous Home Environment Living Arrangements: Alone Available Help at Discharge: Personal care attendant (M-Sunday 2.5 every day except monday 3 hours) Type of Home: Apartment Home Layout: One level Home Access: Elevator Bathroom Shower/Tub: Multimedia programmer: Standard (3n1 over toilet) [place]: Yes Type of Home Care Services: [place] (if known): [place]? Additional Comments: pt has an aide 7 days a week  for 2.5 hours except monday she has 3 hours (A). Pt otherwise alone per patient  Discharge Living Setting Plans for Discharge Living Setting: Alone, Apartment Type of Home at Discharge: Apartment Discharge Home Layout: One level (Apt on 2nd floor. Has an elevator.) Discharge Home Access: Level entry, Elevator Does the patient have any problems obtaining your medications?: No  Social/Family/Support Systems Patient Roles: Parent (Has 2 daughters and 1 son. 1 son deceased.) Contact Information: Aliene Altes - daughter [phone] Anticipated Caregiver: self Ability/Limitations of Caregiver: Has mod I goals. Dtr works here at [employer]. Caregiver Availability: Intermittent Discharge Plan Discussed with Primary Caregiver: Yes Is Caregiver In Agreement with Plan?: Yes Does Caregiver/Family have Issues with Lodging/Transportation while Pt is in Rehab?: No  Goals/Additional Needs Patient/Family Goal for Rehab: PT/OT mod I goals Expected length of stay: 10-14 days Cultural Considerations: AttendsConnected [employer] non-denominational church Dietary Needs: Renal, carb mod, fluid restricted diet Equipment Needs: TBD Special Service Needs: HD T-TH-Sat Pt/Family Agrees to Admission and willing to participate: Yes Program Orientation Provided & Reviewed with Pt/Caregiver Including Roles & Responsibilities: Yes  Decrease burden of Care through IP rehab admission: N/A  Possible need for SNF placement upon discharge: Not planned  Patient Condition: This patient's condition remains as documented in the consult dated 02/08/15, in which the Rehabilitation Physician determined and documented that the patient's condition is appropriate for intensive rehabilitative care in an inpatient rehabilitation facility. Will admit to inpatient rehab today.  Preadmission Screen Completed By: Retta Diones, 02/08/2015 2:58 PM ______________________________________________________________________  Discussed  status with Dr. Naaman Plummer on 02/08/15 at 1458 and received telephone approval for admission today.  Admission Coordinator: Retta Diones, time1458/Date11/25/16          Cosigned by: Meredith Staggers, MD at 02/08/2015 3:06 PM  Revision History     Date/Time User Provider Type Action   02/08/2015 3:06 PM Meredith Staggers, MD Physician Cosign   02/08/2015 2:58 PM Retta Diones, RN Rehab Admission Coordinator Sign

## 2015-02-11 NOTE — Progress Notes (Signed)
Paged Dr. Jonnie Finner regarding HD orders for patient.  According to Dr. Penny Pia note, patient is to have HD today, then resume T-Th-Sa schedule.  No existing orders for HD today.  Brita Romp, RN

## 2015-02-11 NOTE — Progress Notes (Addendum)
Physical Therapy Session Note  Patient Details  Name: Stefanie [name] MRN: [id number] Date of Birth: [birth year]  Today's Date: 02/11/2015 PT Individual Time: 0800-0900  And 1300-1400  PT Individual Time Calculation (min): 60 min and 60 min (total 120 min)  Short Term Goals: Week 1:  PT Short Term Goal 1 (Week 1): Pt will ambulate with one person assist x 20' PT Short Term Goal 2 (Week 1): Pt will demonstrate all bed mobility with supervision PT Short Term Goal 3 (Week 1): Pt will demonstrate car transfers with CGA.  PT Short Term Goal 4 (Week 1): Pt will self propel manual w/c x 40' with min A.   Skilled Therapeutic Interventions/Progress Updates:   0800-0900: Pt received supine in bed, asleep and easily awoken. Supine>sit close S, bedrails and increased time due to postural instability in sitting with frequent LOB onto reclined HOB, able to recover without assist. Sit <>stand mod/maxA x4 reps to A with donning pants, pt having difficulty reaching full standing position and requires assist for control of stand >sit with LOB posteriorly. Stand pivot bed >w/c with rollator and minA. W/c propulsion with BUEs x50' with modA due to RUE weakness and difficulty steering, as well as UE generalized weakness and decreased power production. Stair training on 3" stairs x8 stairs, modA, repetitive cues for upright posture as pt frequently returns to forward trunk/hip flexed position. Sitting and standing balance with dynamic UE reaching to match cards to board in front of pt; in standing pt used RW for balance. Requires frequent rest breaks due to fatigue. Returned to room in w/c totalA; remained seated in w/c with setup A for breakfast and all needs in reach at completion of session.   1300-1400: Pt received seated in w/c, no c/o pain and agreeable to treatment. Transported to gym totalA for energy conservation. Stand pivot transfer w/c <>mat table with modA. Repetitive sit <>stand with dynamic alternating UE  reaching to place/retrieve horseshoes from basketball hoop; performed for standing balance and endurance. Pt becomes very short of breath following standing activity of 1-2 min, however BP and O2 WNL. Gait with RW x80' and min guard, increased time due to slow speed and repetitive cues for upright posture. Sit <>stand x10 reps with one UE on RW, one UE on table and minA decreased to close S by last several reps with improvements in body mechanics and positioning to allow for reduced assist. Gait trial x75' with RW and CGA overall, given verbal cues for upright posture and forward gaze to focus on target to facilitate upright posture. Pt seated on edge of mat table, handoff to OT for next session.   Therapy Documentation Precautions:  Precautions Precautions: Fall Precaution Comments: history of B CVAs Restrictions Weight Bearing Restrictions: No Pain: Pain Assessment Pain Assessment: No/denies pain Pain Score: 0-No pain   See Function Navigator for Current Functional Status.   Therapy/Group: Individual Therapy  Luberta Mutter 02/11/2015, 3:09 PM

## 2015-02-11 NOTE — Progress Notes (Signed)
Social Work  Social Work Assessment and Plan  Patient Details  Name: Stefanie [name] MRN: [id number] Date of Birth: 1945-08-08  Today's Date: 02/11/2015  Problem List:  Patient Active Problem List   Diagnosis Date Noted  . History of CVA with residual deficit 02/11/2015  . Debility 02/08/2015  . DVT (deep venous thrombosis) ([place])   . CHF (congestive heart failure) ([place])   . Chronic diastolic congestive heart failure ([place])   . ESRD (end stage renal disease) ([place])   . Congestive dilated cardiomyopathy ([place])   . Left leg DVT ([place]) 02/01/2015  . Leukocytosis 02/01/2015  . Elevated troponin 02/01/2015  . SOB (shortness of breath) 02/01/2015  . Type 2 diabetes, controlled, with neuropathy ([place]) 12/20/2014  . Pain due to onychomycosis of toenail 12/20/2014  . 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 Mechanical complication of other vascular device, implant, and graft 05/19/2013  . PAD (peripheral artery disease) ([place]) 02/24/2012  . Murmur 12/24/2011  . Bradycardia-intermittent sinus 07/10/2010  . DYSPNEA ON EXERTION 02/25/2010  . CHEST PAIN, EXERTIONAL 02/25/2010  . DM 02/24/2010  . PROTEIN MALNUTRITION 02/24/2010  . HLD (hyperlipidemia) 02/24/2010  . ANEMIA 02/24/2010  . Essential hypertension 02/24/2010  . Cerebral artery occlusion with cerebral infarction ([place]) 02/24/2010  . ESRD on dialysis ([place]) 02/24/2010  . Secondary renal hyperparathyroidism ([place]) 02/24/2010   Past Medical History:  Past Medical History  Diagnosis Date  . Hypertension   . Hyperlipidemia   . Diabetes mellitus   . CVA (cerebral vascular accident) ([place]) [place]    left sided weakness  . Protein malnutrition ([place])   . Secondary hyperparathyroidism ([place])   . Anemia   . End stage renal disease ([place])     initiated HD 2006  . Hx of echocardiogram     a. Echo 03/2010: EF 55-60%, Gr 1 diast dysfn, trivial MS, mild to mod LAE, PASP 34, ;  b. Echo 11/13: mild LVH, EF 60%, Gr 1 diast dysfn, Ao sclerosis,  no AS, MAC, slight MS, mean 3 mmHg, PASP 34  . Hx of cardiovascular stress test     a. MV 03/2010:  no ischemia, EF 55%  . CHF (congestive heart failure) ([place])   . Retinal detachment, bilateral   . CAD (coronary artery disease)    Past Surgical History:  Past Surgical History  Procedure Laterality Date  . Cataract extraction    . Tubal ligation    . Arteriovenous graft placement      BUE numerous times  . Arteriovenous graft placement  11/13/10    Lt femoral - Dr. Kellie Simmering  . Dg av dialysis graft declot or  06/06/11, 08/26/11    performed in IR  . Shuntogram N/A 05/29/2013    Procedure: Earney Mallet;  Surgeon: Conrad Charleroi, MD;  Location: [facility] CATH LAB;  Service: Cardiovascular;  Laterality: N/A;  . Shuntogram Left 03/01/2014    Procedure: SHUNTOGRAM;  Surgeon: Conrad [place] Bridge, MD;  Location: [facility] CATH LAB;  Service: Cardiovascular;  Laterality: Left;  . Cardiac catheterization N/A 02/05/2015    Procedure: Left Heart Cath and Coronary Angiography;  Surgeon: Jettie Booze, MD;  Location: [place] CV LAB;  Service: Cardiovascular;  Laterality: N/A;  . Peripheral vascular catheterization N/A 02/06/2015    Procedure: A/V Shuntogram;  Surgeon: Elam Dutch, MD;  Location: [place] CV LAB;  Service: Cardiovascular;  Laterality: N/A;   Social History:  reports that she quit smoking about 31 years ago. She has never used smokeless  tobacco. She reports that she does not drink alcohol or use illicit drugs.  Family / Support Systems Marital Status: Single Patient Roles: Parent, Other (Comment) (church member) Children: Marine scientist Mack-daughter  [phone]-cell Other Supports: Marine scientist [phone] Anticipated Caregiver: Patient Ability/Limitations of Caregiver: Does not have 24 hr care Caregiver Availability: Intermittent Family Dynamics: Pt has three daughter's and one son she reports they see each other sporiatically and when they can, between their jobs and families. She needs  to be mod/i to return home and feels she can due to just deconditioned from her DVT.  Social History Preferred language: English Religion: None Cultural Background: No issues Education: High School Read: Yes Write: Yes Employment Status: Retired Freight forwarder Issues: No issues Guardian/Conservator: None-according to MD pt is capable of making her own decisions while here.  She does have a daughter who works here at [employer]   Abuse/Neglect Physical Abuse: Denies Verbal Abuse: Denies Sexual Abuse: Denies Exploitation of patient/patient's resources: Denies Self-Neglect: Denies  Emotional Status Pt's affect, behavior adn adjustment status: Pt is motivated to improve and get home, her am therapies tired her out and she needs rest breaks in between them.  She has a aide at home for a few hours each day to assist with her home management tasks. She will try her best while here. Recent Psychosocial Issues: Other health issues-past history of CVA's and HD patient Pyschiatric History: History of depression on celexa and feels this helps her. She feels this is a part of life and relies upon her faith to get her through tough times. Deferred depression screen due to pt on medicaiton already, but may have neuro-psych see while here. Substance Abuse History: No issues  Patient / Family Perceptions, Expectations & Goals Pt/Family understanding of illness & functional limitations: Pt is able to explain her blood clot and working to get her strength back. She talks with the MD and feels her questions are being answered. She wants to go home soon and get back to her rountines. Premorbid pt/family roles/activities: Mother, church member, retiree, dialysis patient, grnadmother Anticipated changes in roles/activities/participation: resume Pt/family expectations/goals: Pt states: " I want to be able to do what I was doing before I go home from here."    [employer]:  Other (Comment) (PCS-services-2-3 hours per day) Premorbid Home Care/DME Agencies: Other (Comment) (had in the past) Transportation available at discharge: [facility] and church members Resource referrals recommended: Neuropsychology, Support group (specify)  Discharge Planning Living Arrangements: Alone Support Systems: Children, Other relatives, Friends/neighbors, Social worker community Type of Residence: Private residence Insurance Resources: [employer], [place] (specify county) Theme park manager Co) Museum/gallery curator Resources: Radio broadcast assistant Screen Referred: No Living Expenses: Education officer, community Management: Patient Does the patient have any problems obtaining your medications?: No Home Management: Physiological scientist and pt-what she can Magazine features editor Plans: Return home to her handicapped apartment, she has a PCS M-SUn 2.5-3 hours per day and has transportation to HD and church. She would need to be safe to stay alone and ambulate short distances in her apartment. Team feels she may need supervision for ambulation but will not have this at home. Will see if pt progresses to a higher level prior to discharge. Social Work Anticipated Follow Up Needs: HH/OP, Support Group  Clinical Impression Pleasant female who is willing to do what she needs to do to go home. She plans to return to her apartment whether she needs supervision or not. She feels she can manage at home with her [facility] aide.  She is doing more here than she has ever done at home, reason so tired and exhausted. Will ask for rest breaks in her schedule and work on a safe discharge plan. Family visits intermittently according to pt so she Needs to be mod/i before going home.  [name] 02/11/2015, 11:49 AM

## 2015-02-11 NOTE — Care Management Note (Signed)
Inpatient [facility] Individual Statement of Services  Patient Name:  Stefanie [name]  Date:  02/11/2015  Welcome to the [facility].  Our goal is to provide you with an individualized program based on your diagnosis and situation, designed to meet your specific needs.  With this comprehensive rehabilitation program, you will be expected to participate in at least 3 hours of rehabilitation therapies Monday-Friday, with modified therapy programming on the weekends.  Your rehabilitation program will include the following services:  Physical Therapy (PT), Occupational Therapy (OT), 24 hour per day rehabilitation nursing, Therapeutic Recreaction (TR), Neuropsychology, Case Management (Social Worker), Rehabilitation Medicine, Nutrition Services and Pharmacy Services  Weekly team conferences will be held on Wednesday to discuss your progress.  Your Social Worker will talk with you frequently to get your input and to update you on team discussions.  Team conferences with you and your family in attendance may also be held.  Expected length of stay: 12-14 days   Overall anticipated outcome: supervision/mod/i level  Depending on your progress and recovery, your program may change. Your Social Worker will coordinate services and will keep you informed of any changes. Your Social Worker's name and contact numbers are listed  below.  The following services may also be recommended but are not provided by the [facility]:   [place] will be made to provide these services after discharge if needed.  Arrangements include referral to agencies that provide these services.  Your insurance has been verified to be:  Medicare & Medicaid Your primary doctor is:  Celedonio Miyamoto  Pertinent information will be shared with your doctor and your insurance company.  Social Worker:  Ovidio Kin, [place] or ([phone]  Information discussed with and copy given to patient by: [name], 02/11/2015, 11:30 AM

## 2015-02-11 NOTE — Progress Notes (Signed)
Occupational Therapy Session Note  Patient Details  Name: [name] Mccullars MRN: [id number] Date of Birth: [birth year]  Today's Date: 02/11/2015 OT Individual Time: [id number] and [id number] OT Individual Time Calculation (min): 60 min and 30 min   Short Term Goals: Week 1:  OT Short Term Goal 1 (Week 1): Pt will perform LB bathing with supervision shower level sit to stand. OT Short Term Goal 2 (Week 1): Pt will perform LB dressing with supervision sit to stand.  OT Short Term Goal 3 (Week 1): Pt will perform toilet transfers and toileting with supervision sit to stand.  OT Short Term Goal 4 (Week 1): Pt will perform simple snack prep with RW and close supervision.   Skilled Therapeutic Interventions/Progress Updates:    1) ADL retraining with focus on sit > stand, standing tolerance, and adaptive techniques to assist with dressing tasks.  Pt completed bathing at sit > stand level at sink due to continuous IV.  Min/steady assist sit > stand, then supervision in standing with bathing.  Pt washed out dirty clothes in sink with focus on standing tolerance.  Issued elastic shoe laces to assist with fastening shoes as pt reports she would just tuck laces into shoes.    2) Treatment session with focus on sit > stand and standing tolerance.  Engaged in standing activity at table top with increased focus on sit > stand and standing tolerance.  Pt maintained standing balance for 3 mins x3 requiring 1-2 min rest break between standing.  Contact guard during standing due to BLE instability.  Performed stand pivot transfer back to bed with min assist and assisted with boosting up in bed.  Therapy Documentation Precautions:  Precautions Precautions: Fall Precaution Comments: history of B CVAs Restrictions Weight Bearing Restrictions: No Pain: Pain Assessment Pain Assessment: No/denies pain Pain Score: 0-No pain  See Function Navigator for Current Functional Status.   Therapy/Group: Individual  Therapy  Simonne Come 02/11/2015, 12:52 PM

## 2015-02-11 NOTE — Progress Notes (Addendum)
[facility] PHYSICAL MEDICINE & REHABILITATION     PROGRESS NOTE    Subjective/Complaints: No complaints this morning. Gives a history of prior stroke affecting the right upper extremity.  ROS: Pt denies fever, rash/itching, headache, blurred or double vision, nausea, vomiting, abdominal pain, diarrhea, chest pain, shortness of breath, palpitations, dysuria, dizziness, neck or back pain,   anxiety, or depression  Objective: Vital Signs: Blood pressure 144/63, pulse 71, temperature 98.4 F (36.9 C), temperature source Oral, resp. rate 18, weight 82.9 kg (182 lb 12.2 oz), SpO2 99 %. No results found.  Recent Labs  02/09/15 0520 02/10/15 0543  WBC 4.1 4.1  HGB 8.7* 8.8*  HCT 27.0* 26.8*  PLT 151 133*    Recent Labs  02/08/15 0900  NA 133*  K 4.6  CL 97*  GLUCOSE 179*  BUN 50*  CREATININE 9.45*  CALCIUM 9.7   CBG (last 3)   Recent Labs  02/10/15 1131 02/10/15 1630 02/10/15 2105  GLUCAP 176* 171* 251*    Wt Readings from Last 3 Encounters:  02/11/15 82.9 kg (182 lb 12.2 oz)  02/08/15 82.4 kg (181 lb 10.5 oz)  03/23/14 88.451 kg (195 lb)    Physical Exam:  Constitutional: She is oriented to person, place, and time. She appears well-developed and well-nourished.  HENT:  Head: Normocephalic and atraumatic.  Mouth/Throat: Oropharynx is clear and moist.  Eyes: Conjunctivae are normal. Pupils are equal, round, and reactive to light.  Neck: Normal range of motion. Neck supple.  Cardiovascular: Normal rate and regular rhythm.  No murmur heard. Respiratory: Effort normal and breath sounds normal. No respiratory distress. She has no wheezes.  GI: Soft. She exhibits no distension. There is no tenderness.  Musculoskeletal: She exhibits edema (min edema left thigh). She exhibits no tenderness.  Left thigh/AVG---removed dressing and small, pin-point area oozing with bright red blood. No pain with palpation of LLE. Has some decreased sensation RLE.  Neurological:  She is alert and oriented to person, place, and time.  Speech dysarthric (edentulous) and able to follow basic commands without difficulty. Sensory deficits RLE? Does sense pain in foot/ankle area. UE:RUE 3- delt, bi, tri, grip, 5/5 on left side, 4/5 BLE Skin: Skin is warm and dry. No rash noted. No erythema.  Psychiatric: She has a normal mood and affect. Her behavior is normal. Thought content normal.   Assessment/Plan: 1. Weakness and balance deficits secondary to multiple medical issues/debility which require 3+ hours per day of interdisciplinary therapy in a comprehensive inpatient rehab setting. Physiatrist is providing close team supervision and 24 hour management of active medical problems listed below. Physiatrist and rehab team continue to assess barriers to discharge/monitor patient progress toward functional and medical goals.  Function:  Bathing Bathing position   Position: Sitting EOB  Bathing parts Body parts bathed by patient: Right arm, Left arm, Chest, Abdomen, Front perineal area, Right upper leg, Left upper leg, Right lower leg, Left lower leg Body parts bathed by helper: Back, Buttocks  Bathing assist Assist Level: Touching or steadying assistance(Pt > 75%)      Upper Body Dressing/Undressing Upper body dressing   What is the patient wearing?: Hospital gown     Pull over shirt/dress - Perfomed by patient: Thread/unthread right sleeve, Thread/unthread left sleeve          Upper body assist Assist Level: No help, No cues   Set up : To obtain clothing/put away  Lower Body Dressing/Undressing Lower body dressing   What is the patient  wearing?: Socks, Shoes, Non-skid slipper socks, Underwear, Pants Underwear - Performed by patient: Thread/unthread right underwear leg, Thread/unthread left underwear leg Underwear - Performed by helper: Pull underwear up/down Pants- Performed by patient: Thread/unthread right pants leg, Thread/unthread left pants leg Pants-  Performed by helper: Pull pants up/down Non-skid slipper socks- Performed by patient: Don/doff right sock, Don/doff left sock Non-skid slipper socks- Performed by helper: Don/doff right sock, Don/doff left sock Socks - Performed by patient: Don/doff right sock, Don/doff left sock   Shoes - Performed by patient: Don/doff right shoe, Don/doff left shoe, Fasten right, Fasten left            Lower body assist Assist for lower body dressing: Touching or steadying assistance (Pt > 75%)      Toileting Toileting   Toileting steps completed by patient: Performs perineal hygiene, Adjust clothing after toileting, Adjust clothing prior to toileting      Toileting assist Assist level: Touching or steadying assistance (Pt.75%)   Transfers Chair/bed transfer   Chair/bed transfer method: Stand pivot Chair/bed transfer assist level: Maximal assist (Pt 25 - 49%/lift and lower) Chair/bed transfer assistive device: Armrests, Medical sales representative     Max distance: 115 ft Assist level: 2 helpers   Wheelchair   Type: Manual Max wheelchair distance: 80 ft Assist Level: Moderate assistance (Pt 50 - 74%)  Cognition Comprehension Comprehension assist level: Understands complex 90% of the time/cues 10% of the time  Expression Expression assist level: Expresses basic 75 - 89% of the time/requires cueing 10 - 24% of the time. Needs helper to occlude trach/needs to repeat words.  Social Interaction Social Interaction assist level: Interacts appropriately 90% of the time - Needs monitoring or encouragement for participation or interaction.  Problem Solving Problem solving assist level: Solves basic 90% of the time/requires cueing < 10% of the time  Memory Memory assist level: Recognizes or recalls 50 - 74% of the time/requires cueing 25 - 49% of the time   Medical Problem List and Plan: 1. Functional deficits secondary to Debility with weakness and balance deficits Prior Right HP from old  CVA mainly affecting arm 2. LLE DVT Prophylaxis/Anticoagulation: Pharmaceutical: Coumadin with heparin bridge continues. Stop plavix once coumadin therapeutic, INR slowly increasing 3. Pain Management: On Neurontin at nights for neuropathy? Continue tylenol prn 4. Mood: LCSW to follow up for evaluation and support.  5. Neuropsych: This patient is capable of making decisions on her own behalf. 6. Skin/Wound Care: Routine pressure relief measures 7. Fluids/Electrolytes/Nutrition: Continue 1200 cc FR daily.  8. CAD: Treat medically and monitor for symptoms/tolerance with increase in activity. 9. HTN: monitor BP bid on lisinopril and coreg. 144/63, no changes 10. ESRD: Nephrology to follow for assistance. Continue HD MWF in afternoons to help with tolerance of therapy during the day.  -AVG site still using despite pressure dressing---may need re-visiting by HD team/nephrologist---continue current dressing for now.   -may need to stop plavix now 11. DM type 2: Used novolog bid prn at home. May benefit from scheduled insulin dosing. Will follow for pattern and Continue to monitor BS ac/hs and use SSI for elevated BS.  -will introduce HS lantus 11/27, await am CBG 12. Anemia of chronic disease: On Aranesp. Monitor and transfuse prn symptoms or Hgb less than 7.0--hgb 8.8  13. Thrombocytopenia: Monitoring closely with heparin on board. 14. PVD: On plavix due to multiple stents left iliac vein. --see above LOS (Days) 3 A FACE TO FACE EVALUATION WAS PERFORMED  Charlett Blake 02/11/2015 6:52 AM

## 2015-02-11 NOTE — Progress Notes (Signed)
Subjective:  On rehab  No cos/ frohd today BUT due to Numerous Emergent hd treatment needed in HD will change HD to tomorrow then back on wed  For normal; schedule   Objective Vital signs in last 24 hours: Filed Vitals:   02/10/15 1324 02/10/15 1735 02/11/15 0548 02/11/15 1300  BP: 130/50 152/51 144/63 129/49  Pulse: 60 66 71 66  Temp: 98 F (36.7 C)  98.4 F (36.9 C) 98.3 F (36.8 C)  TempSrc: Oral  Oral Oral  Resp: 18  18 18   Weight:   82.9 kg (182 lb 12.2 oz)   SpO2: 100%  99% 100%   Weight change: -0.834 kg (-1 lb 13.4 oz)  Physical Exam: General: alert NAD Heart: RRR, no rub or mur Lungs: CTA Abdomen:  Obese, soft nt, nd Extremities:no pedal edema  Dialysis Access: pos bruit  L fem  AVGG   OP Dialysis Orders: [facility] 3.5 hr 2 K 2 Ca profile 4 400/A 1.5 EDW 75 Heparin 4000 Mircera 50 q 2 w ksgiven 11/8 calcitriol 1.5 no Fe Recent: Hgb 10.8 iPTH 690 Ca/P ok  Problem/Plan: 1. L femoral vein DVT - on IV heparin./coumadin per Pharmacy/. VVS has seen / thoughts are that clot is distal to AVG. Had normal shuntogram .. stop plavix once she is therapeutic on coumadin. 2. ESRD - TTS - [place]. Next HD would  Today BUT  EMERGENT cases in HD today .Thus  hd tomor.  and then get back on TTS schedule.  3. HTN - on lisinopril 20 mg PO Daily. BP controlled. HD  For vol . 4. Volume - wt 82.9  Kg. BUT BED WT. No t sob/Thinks she can stand for hd tomor.  Question accuracy. No edema. EDW recently lowered in HD center.  5. Anemia - Hgb 8.6 continues to trend downward. Transfuse prn, will redose ESA next treatment  6. Metabolic bone disease - Continue calcitriol/binders/sensipar 7. Nutrition - renal carb mod diet/vitamin- unintentional weight loss - add nepro 8. DM - per primary. 9. CAD/Elevated Troponin/Acute on Chronic Systolic Dysfunction: Cardiology evaluated.Troponin elevation flat. Echo done 11/19 shows Moderate to severe global reduction in LV function, grade 2 diastolic  dysfunction with elevated LV filling pressure. EF 30-35%. High risk-Stress test which led to cardiac cath 02/05/15 revealing severe LV dysfunction out of proportion to CAD now on coreg. multivessel CAD with chronic lesions, nothing acute. Cardiology without plans for any further intervention.   Ernest Haber, PA-C [facility] [phone] 02/11/2015,4:27 PM  LOS: 3 days   Pt seen, examined and agree w A/P as above.  Kelly Splinter MD [facility] pager [phone]    cell [phone] 02/11/2015, 7:15 PM    Labs: Basic Metabolic Panel:  Recent Labs Lab 02/05/15 0342 02/06/15 0453 02/08/15 0900  NA 135 137 133*  K 4.7 4.5 4.6  CL 97* 99* 97*  CO2 29 29 27   GLUCOSE 222* 201* 179*  BUN 40* 24* 50*  CREATININE 8.91* 5.93* 9.45*  CALCIUM 9.3 9.3 9.7  PHOS  --   --  3.6   Liver Function Tests:  Recent Labs Lab 02/08/15 0900  ALBUMIN 2.9*   No results for input(s): LIPASE, AMYLASE in the last 168 hours. No results for input(s): AMMONIA in the last 168 hours. CBC:  Recent Labs Lab 02/07/15 0351 02/08/15 0251 02/09/15 0520 02/10/15 0543 02/11/15 0720  WBC 4.8 5.2 4.1 4.1 4.1  HGB 8.8* 8.5* 8.7* 8.8* 8.6*  HCT 27.7* 26.8* 27.0* 26.8*  25.8*  MCV 99.6 100.0 101.1* 99.3 99.2  PLT 136* 144* 151 133* 167   Cardiac Enzymes: No results for input(s): CKTOTAL, CKMB, CKMBINDEX, TROPONINI in the last 168 hours. CBG:  Recent Labs Lab 02/10/15 1131 02/10/15 1630 02/10/15 2105 02/11/15 0645 02/11/15 1143  GLUCAP 176* 171* 251* 152* 103*    Studies/Results: No results found. Medications: . heparin 1,200 Units/hr (02/10/15 0001)   . calcitRIOL  1.5 mcg Oral Q T,Th,Sa-HD  . calcium acetate  667 mg Oral TID WC  . carvedilol  3.125 mg Oral BID WC  . cinacalcet  90 mg Oral Q breakfast  . clopidogrel  75 mg Oral Daily  . [START ON 02/16/2015] darbepoetin (ARANESP) injection - DIALYSIS  60 mcg Intravenous Q Sat-HD  . docusate sodium   100 mg Oral BID  . gabapentin  300 mg Oral QHS  . insulin aspart  0-5 Units Subcutaneous QHS  . insulin aspart  0-9 Units Subcutaneous TID WC  . insulin glargine  5 Units Subcutaneous QHS  . ketoconazole  1 application Topical Daily  . latanoprost  1 drop Left Eye QHS  . lisinopril  20 mg Oral QPC supper  . multivitamin  1 tablet Oral QHS  . ofloxacin  1 drop Both Eyes QID  . pantoprazole  40 mg Oral Daily  . polyethylene glycol  17 g Oral Daily  . pravastatin  40 mg Oral Daily  . rOPINIRole  0.5 mg Oral QHS  . warfarin  7.5 mg Oral q1800  . Warfarin - Pharmacist Dosing Inpatient   Does not apply [phone]

## 2015-02-11 NOTE — IPOC Note (Addendum)
Overall Plan of Care [facility]) Patient Details Name: Stefanie [name] MRN: [id number] DOB: Oct 27, 1945  Admitting Diagnosis: debility no st  Hospital Problems: Principal Problem:   Debility Active Problems:   Essential hypertension   ESRD on dialysis ([place])   Type 2 diabetes, controlled, with neuropathy ([place])   Left leg DVT ([place])   DVT (deep venous thrombosis) ([facility])   History of CVA with residual deficit     Functional Problem List: Nursing Bowel, Edema, Endurance, Medication Management, Motor, Skin Integrity  PT Balance, Endurance, Motor, Edema, Pain, Safety, Sensory  OT Balance, Endurance, Motor, Sensory  SLP    TR         Basic ADL's: OT Grooming, Bathing, Dressing, Toileting     Advanced  ADL's: OT Simple Meal Preparation     Transfers: PT Bed Mobility, Bed to Chair, Car, Manufacturing systems engineer, Metallurgist: PT Ambulation, Emergency planning/management officer, Stairs     Additional Impairments: OT Fuctional Use of Upper Extremity  SLP        TR      Anticipated Outcomes Item Anticipated Outcome  Self Feeding modified independent  Swallowing      Basic self-care  supervision to modified independent  Toileting  modified independent   Bathroom Transfers supervision to modified independent  Bowel/Bladder  Mod I  Transfers  Supervision  Locomotion  Supervision short distance gait  Communication     Cognition     Pain  < 3  Safety/Judgment  Mod I   Therapy Plan: PT Intensity: Minimum of 1-2 x/day ,45 to 90 minutes PT Frequency: 5 out of 7 days PT Duration Estimated Length of Stay: 2 weeks OT Intensity: Minimum of 1-2 x/day, 45 to 90 minutes OT Frequency: 5 out of 7 days OT Duration/Estimated Length of Stay: 12-14 days         Team Interventions: Nursing Interventions Patient/Family Education, Bowel Management, Disease Management/Prevention, Medication Management, Skin Care/Wound Management  PT interventions Ambulation/gait training, Discharge  planning, Functional mobility training, Psychosocial support, Therapeutic Activities, Balance/vestibular training, Disease management/prevention, Neuromuscular re-education, Therapeutic Exercise, Wheelchair propulsion/positioning, Cognitive remediation/compensation, DME/adaptive equipment instruction, Pain management, Splinting/orthotics, UE/LE Strength taining/ROM, Academic librarian, Barrister's clerk education, IT trainer, UE/LE Coordination activities  OT Interventions Training and development officer, Engineer, drilling, Neuromuscular re-education, Self Care/advanced ADL retraining, Therapeutic Exercise, UE/LE Strength taining/ROM, Patient/family education, Pain management, Community reintegration, Discharge planning, Functional mobility training, Therapeutic Activities, UE/LE Coordination activities  SLP Interventions    TR Interventions    SW/CM Interventions  Pt/Family Education, Psychosocial Assessment, Discharge Planning    Team Discharge Planning: Destination: PT-Home ,OT- Home , SLP-  Projected Follow-up: PT-Other (comment), Home health PT (level of supervision tbd, pending progress), OT-  Home health OT, SLP-  Projected Equipment Needs: PT-To be determined, OT- To be determined, SLP-  Equipment Details: PT-Pt already owns 4WW, OT-  Patient/family involved in discharge planning: PT- Patient,  OT-Patient, SLP-   MD ELOS: 10-14d Medical Rehab Prognosis:  Good Assessment: 69 y.o. female with history fo HTN, DM type 2 with nephropathy, ESRD, CAD who was admitted from [facility] on 02/01/15 with SOB, left leg discomfort with edema and Left proximal femoral DVT. CTA lungs negative for PE and she was started on IV heparin. Dr. Cathie Olden consulted for input due to progressive SOB limiting activity. Myoview done showing high risk with mixed infarct and ischemia therefore cath recommended for workup. Cardiac cath showed left ventricular EF < 25% with global hypokinesis and 100% stenosis 1st  Diag lesion,  100% stenosed. Mid RCA lesion, 100% stenosed. faint left to right collaterals are present. To be treated medically per Dr. Burt Knack. Patient with history of left thigh AVG and Dr. Scot Dock consulted for input. 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 Oneida Alar on 11/23.    Now requiring 24/7 Rehab RN,MD, as well as CIR level PT, OT .  Treatment team will focus on ADLs and mobility with goals set at [facility] I/Sup  See Team Conference Notes for weekly updates to the plan of care

## 2015-02-12 ENCOUNTER — Inpatient Hospital Stay (HOSPITAL_COMMUNITY): Payer: Medicare Other | Admitting: Occupational Therapy

## 2015-02-12 ENCOUNTER — Inpatient Hospital Stay (HOSPITAL_COMMUNITY): Payer: Medicare Other | Admitting: Physical Therapy

## 2015-02-12 ENCOUNTER — Inpatient Hospital Stay (HOSPITAL_COMMUNITY): Payer: Medicare Other | Admitting: *Deleted

## 2015-02-12 ENCOUNTER — Inpatient Hospital Stay (HOSPITAL_COMMUNITY): Payer: Medicare Other

## 2015-02-12 DIAGNOSIS — I6930 Unspecified sequelae of cerebral infarction: Secondary | ICD-10-CM

## 2015-02-12 LAB — HEPARIN LEVEL (UNFRACTIONATED): Heparin Unfractionated: 0.32 IU/mL (ref 0.30–0.70)

## 2015-02-12 LAB — PROTIME-INR
INR: 2.35 — AB (ref 0.00–1.49)
PROTHROMBIN TIME: 25.5 s — AB (ref 11.6–15.2)

## 2015-02-12 LAB — CBC
HEMATOCRIT: 25.8 % — AB (ref 36.0–46.0)
HEMOGLOBIN: 8.4 g/dL — AB (ref 12.0–15.0)
MCH: 32.1 pg (ref 26.0–34.0)
MCHC: 32.6 g/dL (ref 30.0–36.0)
MCV: 98.5 fL (ref 78.0–100.0)
Platelets: 159 10*3/uL (ref 150–400)
RBC: 2.62 MIL/uL — ABNORMAL LOW (ref 3.87–5.11)
RDW: 17 % — ABNORMAL HIGH (ref 11.5–15.5)
WBC: 5.1 10*3/uL (ref 4.0–10.5)

## 2015-02-12 LAB — RENAL FUNCTION PANEL
Albumin: 3.1 g/dL — ABNORMAL LOW (ref 3.5–5.0)
Anion gap: 12 (ref 5–15)
BUN: 63 mg/dL — ABNORMAL HIGH (ref 6–20)
CHLORIDE: 93 mmol/L — AB (ref 101–111)
CO2: 23 mmol/L (ref 22–32)
CREATININE: 11.15 mg/dL — AB (ref 0.44–1.00)
Calcium: 9.9 mg/dL (ref 8.9–10.3)
GFR calc non Af Amer: 3 mL/min — ABNORMAL LOW (ref >60)
GFR, EST AFRICAN AMERICAN: 4 mL/min — AB (ref >60)
Glucose, Bld: 165 mg/dL — ABNORMAL HIGH (ref 65–99)
Phosphorus: 5.1 mg/dL — ABNORMAL HIGH (ref 2.5–4.6)
Potassium: 5.4 mmol/L — ABNORMAL HIGH (ref 3.5–5.1)
Sodium: 128 mmol/L — ABNORMAL LOW (ref 135–145)

## 2015-02-12 LAB — GLUCOSE, CAPILLARY
GLUCOSE-CAPILLARY: 118 mg/dL — AB (ref 65–99)
GLUCOSE-CAPILLARY: 157 mg/dL — AB (ref 65–99)
Glucose-Capillary: 125 mg/dL — ABNORMAL HIGH (ref 65–99)
Glucose-Capillary: 95 mg/dL (ref 65–99)

## 2015-02-12 MED ORDER — LIDOCAINE-PRILOCAINE 2.5-2.5 % EX CREA: 1.0000 "application " | TOPICAL_CREAM | CUTANEOUS | Status: DC | PRN

## 2015-02-12 MED ORDER — LIDOCAINE HCL (PF) 1 % IJ SOLN: 5.0000 mL | INTRAMUSCULAR | Status: DC | PRN

## 2015-02-12 MED ORDER — DOCUSATE SODIUM 283 MG RE ENEM
1.0000 | ENEMA | Freq: Every day | RECTAL | Status: DC | PRN
  Filled 2015-02-12: qty 1

## 2015-02-12 MED ORDER — PENTAFLUOROPROP-TETRAFLUOROETH EX AERO
INHALATION_SPRAY | CUTANEOUS | Status: AC
  Filled 2015-02-12: qty 103.5

## 2015-02-12 MED ORDER — WARFARIN SODIUM 5 MG PO TABS
5.0000 mg | ORAL_TABLET | Freq: Every day | ORAL | Status: DC
  Administered 2015-02-12: 5 mg via ORAL
  Filled 2015-02-12: qty 1

## 2015-02-12 MED ORDER — SODIUM CHLORIDE 0.9 % IV SOLN: 100.0000 mL | INTRAVENOUS | Status: DC | PRN

## 2015-02-12 MED ORDER — HEPARIN SODIUM (PORCINE) 1000 UNIT/ML DIALYSIS: 1000.0000 [IU] | INTRAMUSCULAR | Status: DC | PRN

## 2015-02-12 MED ORDER — CETYLPYRIDINIUM CHLORIDE 0.05 % MT LIQD
7.0000 mL | Freq: Two times a day (BID) | OROMUCOSAL | Status: DC
  Administered 2015-02-12 – 2015-02-25 (×18): 7 mL via OROMUCOSAL

## 2015-02-12 MED ORDER — PENTAFLUOROPROP-TETRAFLUOROETH EX AERO: 1.0000 "application " | INHALATION_SPRAY | CUTANEOUS | Status: DC | PRN

## 2015-02-12 MED ORDER — DARBEPOETIN ALFA 100 MCG/0.5ML IJ SOSY
PREFILLED_SYRINGE | INTRAMUSCULAR | Status: AC
  Filled 2015-02-12: qty 0.5

## 2015-02-12 MED ORDER — ALTEPLASE 2 MG IJ SOLR
2.0000 mg | Freq: Once | INTRAMUSCULAR | Status: DC | PRN
  Filled 2015-02-12: qty 2

## 2015-02-12 NOTE — Progress Notes (Signed)
[place] PHYSICAL MEDICINE & REHABILITATION     PROGRESS NOTE    Subjective/Complaints: No complaints this morning. States she still makes small amt urine  ROS: Pt denies fever, rash/itching, headache, blurred or double vision, nausea, vomiting, abdominal pain, diarrhea, chest pain, shortness of breath, palpitations, dysuria, dizziness, neck or back pain,   anxiety, or depression  Objective: Vital Signs: Blood pressure 127/60, pulse 55, temperature 97.8 F (36.6 C), temperature source Oral, resp. rate 17, weight 83 kg (182 lb 15.7 oz), SpO2 100 %. No results found.  Recent Labs  02/11/15 0720 02/12/15 0514  WBC 4.1 5.1  HGB 8.6* 8.4*  HCT 25.8* 25.8*  PLT 167 159   No results for input(s): NA, K, CL, GLUCOSE, BUN, CREATININE, CALCIUM in the last 72 hours.  Invalid input(s): CO CBG (last 3)   Recent Labs  02/11/15 1654 02/11/15 2048 02/12/15 0654  GLUCAP 179* 210* 125*    Wt Readings from Last 3 Encounters:  02/12/15 83 kg (182 lb 15.7 oz)  02/08/15 82.4 kg (181 lb 10.5 oz)  03/23/14 88.451 kg (195 lb)    Physical Exam:  Constitutional: She is oriented to person, place, and time. She appears well-developed and well-nourished.  HENT:  Head: Normocephalic and atraumatic.  Mouth/Throat: Oropharynx is clear and moist.  Eyes: Conjunctivae are normal. Pupils are equal, round, and reactive to light.  Neck: Normal range of motion. Neck supple.  Cardiovascular: Normal rate and regular rhythm.  No murmur heard. Respiratory: Effort normal and breath sounds normal. No respiratory distress. She has no wheezes.  GI: Soft. She exhibits no distension. There is no tenderness.  Musculoskeletal: She exhibits edema (min edema left thigh). She exhibits no tenderness.  Left thigh/AVG---removed dressing no bleeding No pain with palpation of LLE. Has some decreased sensation RLE.  Neurological: She is alert and oriented to person, place, and time.  Speech dysarthric  (edentulous) and able to follow basic commands without difficulty. Sensory deficits RLE? Does sense pain in foot/ankle area. UE:RUE 3- delt, bi, tri, grip, 5/5 on left side, 4/5 BLE Skin: Skin is warm and dry. No rash noted. No erythema.  Psychiatric: She has a normal mood and affect. Her behavior is normal. Thought content normal.   Assessment/Plan: 1. Weakness and balance deficits secondary to multiple medical issues/debility which require 3+ hours per day of interdisciplinary therapy in a comprehensive inpatient rehab setting. Physiatrist is providing close team supervision and 24 hour management of active medical problems listed below. Physiatrist and rehab team continue to assess barriers to discharge/monitor patient progress toward functional and medical goals.  Function:  Bathing Bathing position   Position: Wheelchair/chair at sink  Bathing parts Body parts bathed by patient: Right arm, Left arm, Chest, Abdomen, Front perineal area, Right upper leg, Left upper leg, Buttocks Body parts bathed by helper: Back, Buttocks  Bathing assist Assist Level: Touching or steadying assistance(Pt > 75%)      Upper Body Dressing/Undressing Upper body dressing   What is the patient wearing?: Hospital gown     Pull over shirt/dress - Perfomed by patient: Thread/unthread right sleeve, Thread/unthread left sleeve          Upper body assist Assist Level: Set up   Set up : To obtain clothing/put away  Lower Body Dressing/Undressing Lower body dressing   What is the patient wearing?: Underwear, Pants, Shoes Underwear - Performed by patient: Thread/unthread right underwear leg, Thread/unthread left underwear leg Underwear - Performed by helper: Pull underwear up/down Pants-  Performed by patient: Thread/unthread right pants leg, Thread/unthread left pants leg Pants- Performed by helper: Pull pants up/down Non-skid slipper socks- Performed by patient: Don/doff right sock, Don/doff left  sock Non-skid slipper socks- Performed by helper: Don/doff right sock, Don/doff left sock Socks - Performed by patient: Don/doff right sock, Don/doff left sock   Shoes - Performed by patient: Don/doff right shoe, Don/doff left shoe            Lower body assist Assist for lower body dressing: Touching or steadying assistance (Pt > 75%)      Toileting Toileting   Toileting steps completed by patient: Performs perineal hygiene, Adjust clothing after toileting, Adjust clothing prior to toileting      Toileting assist Assist level: Touching or steadying assistance (Pt.75%)   Transfers Chair/bed transfer   Chair/bed transfer method: Stand pivot Chair/bed transfer assist level: Moderate assist (Pt 50 - 74%/lift or lower) Chair/bed transfer assistive device: Armrests, Medical sales representative     Max distance: 85 Assist level: Touching or steadying assistance (Pt > 75%)   Wheelchair   Type: Manual Max wheelchair distance: 50 Assist Level: Moderate assistance (Pt 50 - 74%)  Cognition Comprehension Comprehension assist level: Understands complex 90% of the time/cues 10% of the time  Expression Expression assist level: Expresses basic 75 - 89% of the time/requires cueing 10 - 24% of the time. Needs helper to occlude trach/needs to repeat words.  Social Interaction Social Interaction assist level: Interacts appropriately 90% of the time - Needs monitoring or encouragement for participation or interaction.  Problem Solving Problem solving assist level: Solves basic 90% of the time/requires cueing < 10% of the time  Memory Memory assist level: Recognizes or recalls 50 - 74% of the time/requires cueing 25 - 49% of the time   Medical Problem List and Plan: 1. 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 LLE DVT Prophylaxis/Anticoagulation: Pharmaceutical: Coumadin with heparin bridge continues.  Stop plavix once coumadin therapeutic, INR slowly increasing 3. Pain Management: On Neurontin at nights for neuropathy? Continue tylenol prn no c/os of foot pain 4. Mood: LCSW to follow up for evaluation and support.  5. Neuropsych: This patient is capable of making decisions on her own behalf. 6. Skin/Wound Care: Routine pressure relief measures 7. Fluids/Electrolytes/Nutrition: Continue 1200 cc FR daily.  8. CAD: Treat medically and monitor for symptoms/tolerance with increase in activity. 9. HTN: monitor BP bid on lisinopril and coreg. 144/63, no changes 10. ESRD: Nephrology to follow for assistance. Continue HD MWF in afternoons to help with tolerance of therapy during the day.  -AVG site without further bleeding   11. DM type 2: Used novolog bid prn at home. May benefit from scheduled insulin dosing. Will follow for pattern and Continue to monitor BS ac/hs and use SSI for elevated BS.  -will introduce HS lantus 11/27, 11/29 am CBG 125 12. Anemia of chronic disease: On Aranesp. Monitor and transfuse prn symptoms or Hgb less than 7.0--hgb 8.8  13. Thrombocytopenia: Monitoring closely with heparin on board. 14. PVD: On plavix due to multiple stents left iliac vein. --see above LOS (Days) 4 A FACE TO FACE EVALUATION WAS PERFORMED  Charlett Blake 02/12/2015 7:21 AM

## 2015-02-12 NOTE — Progress Notes (Signed)
Physical Therapy Session Note  Patient Details  Name: Stefanie [name] MRN: [id number] Date of Birth: [birth year]  Today's Date: 02/12/2015 PT Individual Time: 0800-0900 PT Individual Time Calculation (min): 60 min   Short Term Goals: Week 1:  PT Short Term Goal 1 (Week 1): Pt will ambulate with one person assist x 20' PT Short Term Goal 2 (Week 1): Pt will demonstrate all bed mobility with supervision PT Short Term Goal 3 (Week 1): Pt will demonstrate car transfers with CGA.  PT Short Term Goal 4 (Week 1): Pt will self propel manual w/c x 40' with min A.   Skilled Therapeutic Interventions/Progress Updates:    Pt received supine in bed, no c/o pain and agreeable to treatment. Supine>sit with S, bedrails and HOB elevated with increased time due to core strength deficits, decreased coordination. Pt reports she has had incontinent bowel movement. Sit <>stand x3 reps from EOB with RW modA; unable to maintain standing position to allow therapist to A with changing underwear due to knee pain. Pt urgently reports she needs to use restroom; BSC at bedside, stand pivot transfer with RW and modA. Sit <>stand x2 reps to A with donning underwear and perineal hygiene with totalA. Pt transported to laundry room with Bowers for energy conservation. Sit <>stand with RW at washing machine for functional activity of putting clothes in washer; min/modA for standing balance due to excessive forward flexion. Stand pivot transfer w/c >mat table with increased time, +2A due to pts excessive forward posture and hesitancy to progress feet. Sit <>stand x10 reps from edge of mat table with RW for increased LE strength, endurance, and carryover into transfers. Returned to room totalA; remained seated in w/c with all needs in reach at completion of session.   Therapy Documentation Precautions:  Precautions Precautions: Fall Precaution Comments: history of B CVAs Restrictions Weight Bearing Restrictions: No Pain: Pain  Assessment Pain Assessment: No/denies pain Pain Score: 0-No pain   See Function Navigator for Current Functional Status.   Therapy/Group: Individual Therapy  Luberta Mutter 02/12/2015, 12:25 PM

## 2015-02-12 NOTE — Progress Notes (Signed)
Occupational Therapy Session Note  Patient Details  Name: Stefanie [name] MRN: [id number] Date of Birth: 1946/01/24  Today's Date: 02/12/2015 OT Individual Time: 1035-1200 OT Individual Time Calculation (min): 85 min    Short Term Goals: Week 1:  OT Short Term Goal 1 (Week 1): Pt will perform LB bathing with supervision shower level sit to stand. OT Short Term Goal 2 (Week 1): Pt will perform LB dressing with supervision sit to stand.  OT Short Term Goal 3 (Week 1): Pt will perform toilet transfers and toileting with supervision sit to stand.  OT Short Term Goal 4 (Week 1): Pt will perform simple snack prep with RW and close supervision.   Skilled Therapeutic Interventions/Progress Updates:    Treatment session with focus on ADL retraining, sit > stand, and standing balance.  ADL retraining completed at sit > stand level at sink with pt requiring increased time with UB bathing and dressing.  Min cues for safety with LB dressing and recommendations of alternative strategies to increase success with donning socks.  Mod assist sit > stand to pull pants over hips.  Engaged in sit > stand activity in gym with focus on anterior weight shift and powering up, with pt able to progress from mod assist to close supervision/contact guard.  Utilized RW with transfers to promote upright standing.  Standing activity with pt placing clothespins on vertical rod to promote upright standing and standing tolerance.  Pt tolerating standing 3 mins at a time before requiring seated rest break.  Therapy Documentation Precautions:  Precautions Precautions: Fall Precaution Comments: history of B CVAs Restrictions Weight Bearing Restrictions: No Pain: Pain Assessment Pain Assessment: No/denies pain Pain Score: 0-No pain  See Function Navigator for Current Functional Status.   Therapy/Group: Individual Therapy  Simonne Come 02/12/2015, 12:01 PM

## 2015-02-12 NOTE — Progress Notes (Addendum)
Subjective:  No complaints  Objective Vital signs in last 24 hours: Filed Vitals:   02/12/15 1330 02/12/15 1400 02/12/15 1430 02/12/15 1500  BP: 138/65 122/64 131/57 130/65  Pulse: 61 61 63 64  Temp:      TempSrc:      Resp:      Weight:      SpO2:       Weight change: 0.1 kg (3.5 oz)  Physical Exam: General: alert NAD Heart: RRR, no rub or mur Lungs: CTA Abdomen:  Obese, soft nt, nd Extremities:no pedal edema  Dialysis Access: pos bruit  L fem  AVGG   [facility]  3.5h  2/2 bath  P4  75kg   Heparin 4000  L thigh AVG Mircera 50 q 2 wks given 11/8  Calcitriol 1.5 ug Recent: Hgb 10.8 iPTH 690 Ca/P ok   Problems: 1. L femoral vein DVT - on IV heparin./coumadin per Pharmacy/. [facility]VS has seen / thoughts are that clot is distal to AVG. Had normal shuntogram .. stop plavix once she is therapeutic on coumadin. 2. ESRD - TTS HD, but MWF while in rehab 3. HTN - dc lisinopril, cont coreg 4. Volume overload - up 9kg by weights, minimal symptoms  5. Anemia - Hgb 8.6 continues to trend downward. Transfuse prn, will redose ESA next treatment  6. Metabolic bone disease - Continue calcitriol/binders/sensipar 7. Nutrition - renal carb mod diet/vitamin- unintentional weight loss - add nepro 8. DM - per primary. 9. CAD/Elevated Troponin/Acute on Chronic Systolic Dysfunction: Cardiology evaluated.Troponin elevation flat. Echo done 11/19 shows Moderate to severe global reduction in LV function, grade 2 diastolic dysfunction with elevated LV filling pressure. EF 30-35%. High risk-Stress test which led to cardiac cath 02/05/15 revealing severe LV dysfunction out of proportion to CAD now on coreg. multivessel CAD with chronic lesions, nothing acute. Cardiology without plans for any further intervention.  Plan - HD today , get vol down, check CXR. HD tomorrow per rehab schedule (MWF while in rehab, usual TTS). Decrease BP meds to get more vol off.   Kelly Splinter MD [facility] pager [phone]    cell [phone] 02/12/2015, 3:21 PM    Labs: Basic Metabolic Panel:  Recent Labs Lab 02/06/15 0453 02/08/15 0900 02/12/15 1300  NA 137 133* 128*  K 4.5 4.6 5.4*  CL 99* 97* 93*  CO2 29 27 23   GLUCOSE 201* 179* 165*  BUN 24* 50* 63*  CREATININE 5.93* 9.45* 11.15*  CALCIUM 9.3 9.7 9.9  PHOS  --  3.6 5.1*   Liver Function Tests:  Recent Labs Lab 02/08/15 0900 02/12/15 1300  ALBUMIN 2.9* 3.1*   No results for input(s): LIPASE, AMYLASE in the last 168 hours. No results for input(s): AMMONIA in the last 168 hours. CBC:  Recent Labs Lab 02/08/15 0251 02/09/15 0520 02/10/15 0543 02/11/15 0720 02/12/15 0514  WBC 5.2 4.1 4.1 4.1 5.1  HGB 8.5* 8.7* 8.8* 8.6* 8.4*  HCT 26.8* 27.0* 26.8* 25.8* 25.8*  MCV 100.0 101.1* 99.3 99.2 98.5  PLT 144* 151 133* 167 159   Cardiac Enzymes: No results for input(s): CKTOTAL, CKMB, CKMBINDEX, TROPONINI in the last 168 hours. CBG:  Recent Labs Lab 02/11/15 1143 02/11/15 1654 02/11/15 2048 02/12/15 0654 02/12/15 1209  GLUCAP 103* 179* 210* 125* 118*    Studies/Results: No results found. Medications:   . calcitRIOL  1.5 mcg Oral Q T,Th,Sa-HD  . calcium acetate  667 mg Oral TID WC  . carvedilol  3.125 mg Oral  BID WC  . cinacalcet  90 mg Oral Q breakfast  . clopidogrel  75 mg Oral Daily  . darbepoetin (ARANESP) injection - DIALYSIS  100 mcg Intravenous Q Tue-HD  . docusate sodium  100 mg Oral BID  . gabapentin  300 mg Oral QHS  . insulin aspart  0-5 Units Subcutaneous QHS  . insulin aspart  0-9 Units Subcutaneous TID WC  . insulin glargine  5 Units Subcutaneous QHS  . ketoconazole  1 application Topical Daily  . latanoprost  1 drop Left Eye QHS  . lisinopril  20 mg Oral QPC supper  . multivitamin  1 tablet Oral QHS  . ofloxacin  1 drop Both Eyes QID  . pantoprazole  40 mg Oral Daily  . polyethylene glycol  17 g Oral Daily  . pravastatin  40 mg Oral Daily  . rOPINIRole  0.5 mg Oral QHS   . warfarin  5 mg Oral q1800  . Warfarin - Pharmacist Dosing Inpatient   Does not apply [phone]

## 2015-02-12 NOTE — Progress Notes (Signed)
ANTICOAGULATION CONSULT NOTE - Follow Up Consult  Pharmacy Consult for coumadin Indication: DVT  No Known Allergies  Patient Measurements: Weight: 182 lb 15.7 oz (83 kg) Heparin Dosing Weight:   Vital Signs: Temp: 97.8 F (36.6 C) (11/29 0538) Temp Source: Oral (11/29 0538) BP: 127/60 mmHg (11/29 0538) Pulse Rate: 55 (11/29 0538)  Labs:  Recent Labs  02/10/15 0532  02/10/15 0543 02/11/15 0720 02/12/15 0514  HGB  --   < > 8.8* 8.6* 8.4*  HCT  --   --  26.8* 25.8* 25.8*  PLT  --   --  133* 167 159  LABPROT 17.0*  --   --  21.6* 25.5*  INR 1.38  --   --  1.89* 2.35*  HEPARINUNFRC 0.44  --   --  0.34 0.32  < > = values in this interval not displayed.  Estimated Creatinine Clearance: 5.9 mL/min (by C-G formula based on Cr of 9.45).   Medications:  Scheduled:  . calcitRIOL  1.5 mcg Oral Q T,Th,Sa-HD  . calcium acetate  667 mg Oral TID WC  . carvedilol  3.125 mg Oral BID WC  . cinacalcet  90 mg Oral Q breakfast  . clopidogrel  75 mg Oral Daily  . darbepoetin (ARANESP) injection - DIALYSIS  100 mcg Intravenous Q Tue-HD  . docusate sodium  100 mg Oral BID  . gabapentin  300 mg Oral QHS  . insulin aspart  0-5 Units Subcutaneous QHS  . insulin aspart  0-9 Units Subcutaneous TID WC  . insulin glargine  5 Units Subcutaneous QHS  . ketoconazole  1 application Topical Daily  . latanoprost  1 drop Left Eye QHS  . lisinopril  20 mg Oral QPC supper  . multivitamin  1 tablet Oral QHS  . ofloxacin  1 drop Both Eyes QID  . pantoprazole  40 mg Oral Daily  . polyethylene glycol  17 g Oral Daily  . pravastatin  40 mg Oral Daily  . rOPINIRole  0.5 mg Oral QHS  . warfarin  7.5 mg Oral q1800  . Warfarin - Pharmacist Dosing Inpatient   Does not apply q1800   Assessment: 69 yo female with DVT is currently on D#6 heparin bridge to coumadin. Heparin level = 0.32, therapeutic on 1200 un. INR 1.89 > 2.35. hgb 8.4, plt 159K, stable. No bleeding noted per chart.  Goal of Therapy:  INR  = 2-3 Monitor platelets by anticoagulation protocol: Yes   Plan:  - Coumadin 5 mg po daily, might need alternating 5/7.5 mg daily - D/C IV heparin - Daily PT/INR  Maryanna Shape, PharmD, BCPS  Clinical Pharmacist  Pager: [phone]   02/12/2015,1:01 PM

## 2015-02-13 ENCOUNTER — Inpatient Hospital Stay (HOSPITAL_COMMUNITY): Payer: Medicare Other | Admitting: Occupational Therapy

## 2015-02-13 ENCOUNTER — Inpatient Hospital Stay (HOSPITAL_COMMUNITY): Payer: Medicare Other | Admitting: Physical Therapy

## 2015-02-13 DIAGNOSIS — N186 End stage renal disease: Secondary | ICD-10-CM | POA: Diagnosis not present

## 2015-02-13 DIAGNOSIS — E1129 Type 2 diabetes mellitus with other diabetic kidney complication: Secondary | ICD-10-CM | POA: Diagnosis not present

## 2015-02-13 DIAGNOSIS — Z992 Dependence on renal dialysis: Secondary | ICD-10-CM | POA: Diagnosis not present

## 2015-02-13 LAB — PROTIME-INR
INR: 2.44 — AB (ref 0.00–1.49)
PROTHROMBIN TIME: 26.2 s — AB (ref 11.6–15.2)

## 2015-02-13 LAB — GLUCOSE, CAPILLARY
GLUCOSE-CAPILLARY: 158 mg/dL — AB (ref 65–99)
Glucose-Capillary: 129 mg/dL — ABNORMAL HIGH (ref 65–99)
Glucose-Capillary: 92 mg/dL (ref 65–99)
Glucose-Capillary: 110 mg/dL — ABNORMAL HIGH (ref 65–99)

## 2015-02-13 MED ORDER — WARFARIN SODIUM 7.5 MG PO TABS
7.5000 mg | ORAL_TABLET | Freq: Once | ORAL | Status: AC
  Administered 2015-02-13: 7.5 mg via ORAL
  Filled 2015-02-13: qty 1

## 2015-02-13 NOTE — Progress Notes (Addendum)
Recreational Therapy Assessment and Plan  Patient Details  Name: Stefanie [name] MRN: [id number] Date of Birth: Oct 20, 1945 Today's Date: 02/13/2015  Rehab Potential: Good ELOS: 2 weeks  Assessment Clinical Impression:  Problem List:  Patient Active Problem List   Diagnosis Date Noted  . Debility 02/08/2015  . DVT (deep venous thrombosis) ([place])   . CHF (congestive heart failure) ([place])   . Chronic diastolic congestive heart failure ([place])   . ESRD (end stage renal disease) ([place])   . Congestive dilated cardiomyopathy ([place])   . Left leg DVT ([place]) 02/01/2015  . Leukocytosis 02/01/2015  . Elevated troponin 02/01/2015  . SOB (shortness of breath) 02/01/2015  . Type 2 diabetes, controlled, with neuropathy ([place]) 12/20/2014  . Pain due to onychomycosis of toenail 12/20/2014  . 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 Mechanical complication of other vascular device, implant, and graft 05/19/2013  . PAD (peripheral artery disease) ([place]) 02/24/2012  . Murmur 12/24/2011  . Bradycardia-intermittent sinus 07/10/2010  . DYSPNEA ON EXERTION 02/25/2010  . CHEST PAIN, EXERTIONAL 02/25/2010  . DM 02/24/2010  . PROTEIN MALNUTRITION 02/24/2010  . HLD (hyperlipidemia) 02/24/2010  . ANEMIA 02/24/2010  . Essential hypertension 02/24/2010  . Cerebral artery occlusion with cerebral infarction ([place]) 02/24/2010  . ESRD on dialysis ([place]) 02/24/2010  . Secondary renal hyperparathyroidism ([place]) 02/24/2010    Past Medical History:  Past Medical History  Diagnosis Date  . Hypertension   . Hyperlipidemia   . Diabetes mellitus   . CVA (cerebral vascular accident) ([place]) [place]    left sided weakness  . Protein malnutrition ([place])   . Secondary hyperparathyroidism ([place])   . Anemia   . End stage renal disease ([place])     initiated HD 2006  . Hx of echocardiogram     a. Echo  03/2010: EF 55-60%, Gr 1 diast dysfn, trivial MS, mild to mod LAE, PASP 34, ; b. Echo 11/13: mild LVH, EF 60%, Gr 1 diast dysfn, Ao sclerosis, no AS, MAC, slight MS, mean 3 mmHg, PASP 34  . Hx of cardiovascular stress test     a. MV 03/2010: no ischemia, EF 55%  . CHF (congestive heart failure) ([place])   . Retinal detachment, bilateral   . CAD (coronary artery disease)    Past Surgical History:  Past Surgical History  Procedure Laterality Date  . Cataract extraction    . Tubal ligation    . Arteriovenous graft placement      BUE numerous times  . Arteriovenous graft placement  11/13/10    Lt femoral - Dr. Kellie Simmering  . Dg av dialysis graft declot or  06/06/11, 08/26/11    performed in IR  . Shuntogram N/A 05/29/2013    Procedure: Earney Mallet; Surgeon: Conrad Robie Creek, MD; Location: [facility] CATH LAB; Service: Cardiovascular; Laterality: N/A;  . Shuntogram Left 03/01/2014    Procedure: SHUNTOGRAM; Surgeon: Conrad H[place]qua, MD; Location: [facility] CATH LAB; Service: Cardiovascular; Laterality: Left;  . Cardiac catheterization N/A 02/05/2015    Procedure: Left Heart Cath and Coronary Angiography; Surgeon: Jettie Booze, MD; Location: [place] CV LAB; Service: Cardiovascular; Laterality: N/A;  . Peripheral vascular catheterization N/A 02/06/2015    Procedure: A/V Shuntogram; Surgeon: Elam Dutch, MD; Location: [place] CV LAB; Service: Cardiovascular; Laterality: N/A;    Assessment & Plan Clinical Impression: Patient is a 69 y.o. year old female with recent admission to the hospital on11/18/16 with SOB, left leg discomfort with edema and Left proximal femoral DVT. CTA lungs negative  for PE and she was started on IV heparin. Dr. Cathie Olden consulted for input due to progressive SOB limiting activity. Myoview done showing high risk with mixed infarct and ischemia therefore cath recommended for workup. Cardiac cath  showed left ventricular EF < 25% with global hypokinesis and 100% stenosis 1st Diag lesion, 100% stenosed. Mid RCA lesion, 100% stenosed. faint left to right collaterals are present. To be treated medically per Dr. Burt Knack. Patient with history of left thigh AVG and Dr. Scot Dock consulted for input. Left thigh shuntogram showed 60% in-stent stenosis requiring angioplasty. Patient transferred to CIR on 02/08/2015 .      Pt presents with decreased activity tolerance, decreased functional mobility, decreased balance, decreased coordination Limiting pt's independence with leisure/community pursuits.   Plan Min 1 time per week >20 mintues  Recommendations for other services: None  Discharge Criteria: Patient will be discharged from TR if patient refuses treatment 3 consecutive times without medical reason.  If treatment goals not met, if there is a change in medical status, if patient makes no progress towards goals or if patient is discharged from hospital.  The above assessment, treatment plan, treatment alternatives and goals were discussed and mutually agreed upon: by patient     Session note:  Session focused on activity tolerance, standing tolerance, standing balance with 1 or no UE support while playing card game during co-treat with PT.  Pt required supervision-min assist for balance and instructional cues for posture throughout activity.  Pt with low activity tolerance requiring seated rest breaks throughout session.  Dresser 02/13/2015, 11:19 AM

## 2015-02-13 NOTE — Progress Notes (Signed)
Subjective:  Tolerated HD yesterday normal op HD TTS  Objective Vital signs in last 24 hours: Filed Vitals:   02/12/15 1630 02/12/15 1700 02/12/15 1721 02/13/15 0500  BP: 127/54 125/65 144/74 91/60  Pulse: 64 65 67 64  Temp:   97.3 F (36.3 C) 99.3 F (37.4 C)  TempSrc:   Oral Oral  Resp:   16 16  Weight:   80.6 kg (177 lb 11.1 oz) 87.6 kg (193 lb 2 oz)  SpO2:   99% 99%   Weight change: 1.5 kg (3 lb 4.9 oz) Physical Exam: General: alert NAD Heart: RRR, no rub or mur Lungs: CTA/ nonlabored breathing Abdomen: Obese, soft nt, nd Extremities:Trace bipedal edema Dialysis Access: pos bruit L fem AVGG   [facility] TTS 3.5h 2/2 bath P4 75kg Heparin 4000 L thigh AVG Mircera 50 q 2 wks given 11/8  Calcitriol 1.5 ug Recent: Hgb 10.8 iPTH 690 Ca/P ok  Problem/Plan: 1. L femoral vein DVT - on IV heparin./coumadin per Pharmacy/. VVS has seen / thoughts are that clot is distal to AVG. Had normal shuntogram .. stop plavix once she is therapeutic on coumadin. 2. Volume overload -was  up 9kg by weights??BEDWTS/ HD yest 4 uf / now 5  kg>edw and CXR yest post HD "Lungs Clear" / tolerating PT without sob symptoms/ states she can stand to weigh now/ HD tomor.thurs uf 3.5 liters as bp allows / 99% o2 sat rm air 3. ESRD - TTS HD = ash OP schedule /was changed in [place]  MWF / HD yest./For Rehab Issues (less PT on Sat) continue TTS schedule 4. HTN - dc lisinopril, cont coreg bp 91/60 this am. 5. Anemia - Hgb 8.4 continues to trend downward. Transfuse prn,  tues HD 11/29 given 100 Aranesp / check iron stores next hd 6. Metabolic bone disease - Continue calcitriol/binders/sensipar 7. Nutrition - renal carb mod diet/vitamin- unintentional weight loss - Alb 3.1add breeze suplement 8. DM - per primary. 9. CAD/Elevated Troponin/Acute on Chronic Systolic Dysfunction: Cardiology evaluated.Troponin elevation flat. Echo done 11/19 shows Moderate to severe global reduction in LV function, grade 2  diastolic dysfunction with elevated LV filling pressure. EF 30-35%. High risk-Stress test which led to cardiac cath 02/05/15 revealing severe LV dysfunction out of proportion to CAD now on coreg. multivessel CAD with chronic lesions, nothing acute. Cardiology without plans for any further intervention.  Ernest Haber, PA-C [facility] Beeper [phone] 02/13/2015,11:11 AM  LOS: 5 days   Pt seen, examined and agree w A/P as above. HD tomorrow, cont on TTS schedule. Get vol down as tolerated.  Kelly Splinter MD [facility] pager [phone]    cell [phone] 02/13/2015, 4:14 PM    Labs: Basic Metabolic Panel:  Recent Labs Lab 02/08/15 0900 02/12/15 1300  NA 133* 128*  K 4.6 5.4*  CL 97* 93*  CO2 27 23  GLUCOSE 179* 165*  BUN 50* 63*  CREATININE 9.45* 11.15*  CALCIUM 9.7 9.9  PHOS 3.6 5.1*   Liver Function Tests:  Recent Labs Lab 02/08/15 0900 02/12/15 1300  ALBUMIN 2.9* 3.1*   No results for input(s): LIPASE, AMYLASE in the last 168 hours. No results for input(s): AMMONIA in the last 168 hours. CBC:  Recent Labs Lab 02/08/15 0251 02/09/15 0520 02/10/15 0543 02/11/15 0720 02/12/15 0514  WBC 5.2 4.1 4.1 4.1 5.1  HGB 8.5* 8.7* 8.8* 8.6* 8.4*  HCT 26.8* 27.0* 26.8* 25.8* 25.8*  MCV 100.0 101.1* 99.3 99.2 98.5  PLT 144* 151 133*  167 159   Cardiac Enzymes: No results for input(s): CKTOTAL, CKMB, CKMBINDEX, TROPONINI in the last 168 hours. CBG:  Recent Labs Lab 02/12/15 0654 02/12/15 1209 02/12/15 1806 02/12/15 2122 02/13/15 0640  GLUCAP 125* 118* 95 157* 129*    Studies/Results: Dg Chest 2 View  02/12/2015  CLINICAL DATA:  Short of breath EXAM: CHEST  2 VIEW COMPARISON:  02/02/2015 FINDINGS: Stable linear metal object projecting over the right apex. Moderate cardiomegaly. Lungs under aerated and clear. No pleural effusion. No pneumothorax. Vascular congestion. IMPRESSION: Cardiomegaly and vascular congestion.  Electronically Signed   By: Marybelle Killings M.D.   On: 02/12/2015 20:44   Medications:   . antiseptic oral rinse  7 mL Mouth Rinse BID  . calcitRIOL  1.5 mcg Oral Q T,Th,Sa-HD  . calcium acetate  667 mg Oral TID WC  . carvedilol  3.125 mg Oral BID WC  . cinacalcet  90 mg Oral Q breakfast  . darbepoetin (ARANESP) injection - DIALYSIS  100 mcg Intravenous Q Tue-HD  . docusate sodium  100 mg Oral BID  . gabapentin  300 mg Oral QHS  . insulin aspart  0-5 Units Subcutaneous QHS  . insulin aspart  0-9 Units Subcutaneous TID WC  . insulin glargine  5 Units Subcutaneous QHS  . ketoconazole  1 application Topical Daily  . latanoprost  1 drop Left Eye QHS  . multivitamin  1 tablet Oral QHS  . ofloxacin  1 drop Both Eyes QID  . pantoprazole  40 mg Oral Daily  . polyethylene glycol  17 g Oral Daily  . pravastatin  40 mg Oral Daily  . rOPINIRole  0.5 mg Oral QHS  . warfarin  7.5 mg Oral ONCE-1800  . Warfarin - Pharmacist Dosing Inpatient   Does not apply [phone]

## 2015-02-13 NOTE — Progress Notes (Signed)
Occupational Therapy Session Note  Patient Details  Name: Stefanie [name] MRN: [id number] Date of Birth: [birth year]  Today's Date: 02/13/2015 OT Individual Time: 1300-1330 OT Individual Time Calculation (min): 30 min    Short Term Goals: Week 1:  OT Short Term Goal 1 (Week 1): Pt will perform LB bathing with supervision shower level sit to stand. OT Short Term Goal 2 (Week 1): Pt will perform LB dressing with supervision sit to stand.  OT Short Term Goal 3 (Week 1): Pt will perform toilet transfers and toileting with supervision sit to stand.  OT Short Term Goal 4 (Week 1): Pt will perform simple snack prep with RW and close supervision.   Skilled Therapeutic Interventions/Progress Updates:    Pt seen for OT session focusing on sit <> stands, functional ambulation,and ADL re-training. Pt in w/c upon arrival, agreeable to tx session. She ambulated 259 ft using rollator with min A and one standing rest break during middle of ambulation. Cues provided for upright posture. In her room, she completed 4x sit <> stand at rollator with supervision. She then voiced she has bowel incontinent episode and requested toileting task. Pt with soiled underwear, underwear changed and hygiene completed. She became very tearful when sitting on toilet, voicing sadness that her children were not here offering support. Empathetic listening and encouragement provided. She returned to w/c at end of session, left sitting in w/c with all needs in reach.   Therapy Documentation Precautions:  Precautions Precautions: Fall Precaution Comments: history of B CVAs Restrictions Weight Bearing Restrictions: No Pain:   no/ denies pain  See Function Navigator for Current Functional Status.   Therapy/Group: Individual Therapy  Lewis, Cai Anfinson C 02/13/2015, 9:19 AM

## 2015-02-13 NOTE — Progress Notes (Signed)
ANTICOAGULATION CONSULT NOTE - Follow Up Consult  Pharmacy Consult for coumadin Indication: DVT  No Known Allergies  Patient Measurements: Weight: 193 lb 2 oz (87.6 kg) Heparin Dosing Weight:   Vital Signs: Temp: 99.3 F (37.4 C) (11/30 0500) Temp Source: Oral (11/30 0500) BP: 91/60 mmHg (11/30 0500) Pulse Rate: 64 (11/30 0500)  Labs:  Recent Labs  02/11/15 0720 02/12/15 0514 02/12/15 1300 02/13/15 0415  HGB 8.6* 8.4*  --   --   HCT 25.8* 25.8*  --   --   PLT 167 159  --   --   LABPROT 21.6* 25.5*  --  26.2*  INR 1.89* 2.35*  --  2.44*  HEPARINUNFRC 0.34 0.32  --   --   CREATININE  --   --  11.15*  --     Estimated Creatinine Clearance: 5.1 mL/min (by C-G formula based on Cr of 11.15).   Medications:  Scheduled:  . antiseptic oral rinse  7 mL Mouth Rinse BID  . calcitRIOL  1.5 mcg Oral Q T,Th,Sa-HD  . calcium acetate  667 mg Oral TID WC  . carvedilol  3.125 mg Oral BID WC  . cinacalcet  90 mg Oral Q breakfast  . darbepoetin (ARANESP) injection - DIALYSIS  100 mcg Intravenous Q Tue-HD  . docusate sodium  100 mg Oral BID  . gabapentin  300 mg Oral QHS  . insulin aspart  0-5 Units Subcutaneous QHS  . insulin aspart  0-9 Units Subcutaneous TID WC  . insulin glargine  5 Units Subcutaneous QHS  . ketoconazole  1 application Topical Daily  . latanoprost  1 drop Left Eye QHS  . multivitamin  1 tablet Oral QHS  . ofloxacin  1 drop Both Eyes QID  . pantoprazole  40 mg Oral Daily  . polyethylene glycol  17 g Oral Daily  . pravastatin  40 mg Oral Daily  . rOPINIRole  0.5 mg Oral QHS  . warfarin  5 mg Oral q1800  . Warfarin - Pharmacist Dosing Inpatient   Does not apply q1800   Assessment: 69 yo female with DVT is currently on coumadin. INR therapeutic at 2.44. hgb 8.4, plt 159K, stable. No bleeding noted per chart.  Goal of Therapy:  INR = 2-3 Monitor platelets by anticoagulation protocol: Yes   Plan:  - Coumadin 7.5 mg po today, might need alternating 5/7.5  mg daily - Daily PT/INR  Maryanna Shape, PharmD, BCPS  Clinical Pharmacist  Pager: [phone]   02/13/2015,9:36 AM

## 2015-02-13 NOTE — Progress Notes (Signed)
Physical Therapy Session Note  Patient Details  Name: Stefanie [name] MRN: [id number] Date of Birth: 08-17-1945  Today's Date: 02/13/2015 PT Individual Time: 0915-1000 and 1400-1530 PT Individual Time Calculation (min): 45 min and 90 min (total 135 min)   Short Term Goals: Week 1:  PT Short Term Goal 1 (Week 1): Pt will ambulate with one person assist x 20' PT Short Term Goal 2 (Week 1): Pt will demonstrate all bed mobility with supervision PT Short Term Goal 3 (Week 1): Pt will demonstrate car transfers with CGA.  PT Short Term Goal 4 (Week 1): Pt will self propel manual w/c x 40' with min A.   Skilled Therapeutic Interventions/Progress Updates:    0915-1000: Pt received seated on BSC with NA present; no c/o pain and agreeable to treatment. Sit <>stand to A with donning brief x3 trials due to several LOB posteriorly requiring maxA to slow descent for safety. Sit <>stand with minA and BSC moved, replaced with RW to allow pt to sit. Squat pivot transfer w/c >mat table close S with verbal cues for sequencing and safety. Repetitive sit <>stand with one UE on mat table, one UE on RW, and standing balance with close S/min guard overall while engaged in card game at elevated table. Pt demonstrates ability to reach full standing position with verbal cues, however unable to maintain >5 seconds before returning to forward hip flexion, trunk flexion. Several rest breaks due to LE fatigue in standing. Requires assist for holding cards due to poor standing balance and only able to use one UE at a time for drawing/playing cards. Returned to room totalA and remained seated in w/c with all needs in reach at completion of session.   1400-1530: Pt received seated in w/c with no c/o pain and agreeable to treatment. W/c propulsion x50' with BUEs and minA for navigation due to decreased RUE strength and power production. Nustep x15 min with BUE/BLE and RLE assist to reduce adduction/internal rotation. Standing  balance, endurance and postural control with RW and overhead reaching LUE tasks while playing games on mirror with dry erase markers. Occasional seated rest breaks due to fatigue and RLE knee pain, however improved upright posture and less cueing needed for postural control. BUE strengthening with 3 and 5 pound weighted bars hitting beach ball in sitting and standing. Gait x2 trials of 48' each with CGA and RW; min verbal cues for upright posture. Slow speed overall and occasional standing rest breaks due to fatigue. Pt remained seated in w/c with all needs in reach at completion of session all needs within reach.   Therapy Documentation Precautions:  Precautions Precautions: Fall Precaution Comments: history of B CVAs Restrictions Weight Bearing Restrictions: No Pain: Pain Assessment Pain Assessment: No/denies pain Pain Score: 0-No pain   See Function Navigator for Current Functional Status.   Therapy/Group: Individual Therapy  Luberta Mutter 02/13/2015, 10:04 AM

## 2015-02-13 NOTE — Progress Notes (Signed)
[facility] PHYSICAL MEDICINE & REHABILITATION     PROGRESS NOTE    Subjective/Complaints: Had wheezing last noc, no current resp distress. Discussed low BP, HD and wheezing with RNs  ROS: Pt denies fever, rash/itching, headache, blurred or double vision, nausea, vomiting, abdominal pain, diarrhea, chest pain, shortness of breath,  Objective: Vital Signs: Blood pressure 91/60, pulse 64, temperature 99.3 F (37.4 C), temperature source Oral, resp. rate 16, weight 87.6 kg (193 lb 2 oz), SpO2 99 %. Dg Chest 2 View  02/12/2015  CLINICAL DATA:  Short of breath EXAM: CHEST  2 VIEW COMPARISON:  02/02/2015 FINDINGS: Stable linear metal object projecting over the right apex. Moderate cardiomegaly. Lungs under aerated and clear. No pleural effusion. No pneumothorax. Vascular congestion. IMPRESSION: Cardiomegaly and vascular congestion. Electronically Signed   By: Marybelle Killings M.D.   On: 02/12/2015 20:44    Recent Labs  02/11/15 0720 02/12/15 0514  WBC 4.1 5.1  HGB 8.6* 8.4*  HCT 25.8* 25.8*  PLT 167 159    Recent Labs  02/12/15 1300  NA 128*  K 5.4*  CL 93*  GLUCOSE 165*  BUN 63*  CREATININE 11.15*  CALCIUM 9.9   CBG (last 3)   Recent Labs  02/12/15 1806 02/12/15 2122 02/13/15 0640  GLUCAP 95 157* 129*    Wt Readings from Last 3 Encounters:  02/13/15 87.6 kg (193 lb 2 oz)  02/08/15 82.4 kg (181 lb 10.5 oz)  03/23/14 88.451 kg (195 lb)    Physical Exam:  Constitutional: She is oriented to person, place, and time. She appears well-developed and well-nourished.  HENT:  Head: Normocephalic and atraumatic.  Mouth/Throat: Oropharynx is clear and moist.  Eyes: Conjunctivae are normal. Pupils are equal, round, and reactive to light.  Neck: Normal range of motion. Neck supple.  Cardiovascular: Normal rate and regular rhythm.  No murmur heard. Respiratory: Effort normal and breath sounds normal. No respiratory distress. She has no wheezes.  GI: Soft. She exhibits no  distension. There is no tenderness.  Musculoskeletal: She exhibits no edema. She exhibits no tenderness.  Left thigh/AVG---removed dressing no bleeding No pain with palpation of LLE. Has some decreased sensation RLE.  Neurological: She is alert and oriented to person, place, and time.  Speech dysarthric (edentulous) and able to follow basic commands without difficulty. Sensory deficits RLE? Does sense pain in foot/ankle area. UE:RUE 3- delt, bi, tri, grip, 5/5 on left side, 4/5 BLE Skin: Skin is warm and dry. No rash noted. No erythema.  Psychiatric: She has a normal mood and affect. Her behavior is normal. Thought content normal.   Assessment/Plan: 1. Weakness and balance deficits secondary to multiple medical issues/debility which require 3+ hours per day of interdisciplinary therapy in a comprehensive inpatient rehab setting. Physiatrist is providing close team supervision and 24 hour management of active medical problems listed below. Physiatrist and rehab team continue to assess barriers to discharge/monitor patient progress toward functional and medical goals. Team conference today please see physician documentation under team conference tab, met with team face-to-face to discuss problems,progress, and goals. Formulized individual treatment plan based on medical history, underlying problem and comorbidities.Function:  Bathing Bathing position   Position: Wheelchair/chair at sink  Bathing parts Body parts bathed by patient: Right arm, Left arm, Chest, Abdomen (only completed UB bathing) Body parts bathed by helper: Back, Buttocks  Bathing assist Assist Level: Supervision or verbal cues      Upper Body Dressing/Undressing Upper body dressing   What is the patient  wearing?: Pull over shirt/dress     Pull over shirt/dress - Perfomed by patient: Thread/unthread right sleeve, Thread/unthread left sleeve, Put head through opening, Pull shirt over trunk          Upper body assist  Assist Level: Set up   Set up : To obtain clothing/put away  Lower Body Dressing/Undressing Lower body dressing   What is the patient wearing?: Pants, Socks, Shoes Underwear - Performed by patient: Thread/unthread right underwear leg, Thread/unthread left underwear leg Underwear - Performed by helper: Pull underwear up/down Pants- Performed by patient: Thread/unthread right pants leg, Thread/unthread left pants leg Pants- Performed by helper: Pull pants up/down Non-skid slipper socks- Performed by patient: Don/doff right sock, Don/doff left sock Non-skid slipper socks- Performed by helper: Don/doff right sock, Don/doff left sock Socks - Performed by patient: Don/doff right sock, Don/doff left sock   Shoes - Performed by patient: Don/doff right shoe, Don/doff left shoe            Lower body assist Assist for lower body dressing: Touching or steadying assistance (Pt > 75%)      Toileting Toileting   Toileting steps completed by patient: Performs perineal hygiene, Adjust clothing after toileting, Adjust clothing prior to toileting Toileting steps completed by helper: Adjust clothing after toileting, Adjust clothing prior to toileting, Performs perineal hygiene    Toileting assist Assist level: Touching or steadying assistance (Pt.75%)   Transfers Chair/bed transfer   Chair/bed transfer method: Stand pivot Chair/bed transfer assist level: Moderate assist (Pt 50 - 74%/lift or lower) Chair/bed transfer assistive device: Armrests, Medical sales representative     Max distance: 85 Assist level: Touching or steadying assistance (Pt > 75%)   Wheelchair   Type: Manual Max wheelchair distance: 50 Assist Level: Moderate assistance (Pt 50 - 74%)  Cognition Comprehension Comprehension assist level: Understands complex 90% of the time/cues 10% of the time  Expression Expression assist level: Expresses basic 75 - 89% of the time/requires cueing 10 - 24% of the time. Needs helper  to occlude trach/needs to repeat words.  Social Interaction Social Interaction assist level: Interacts appropriately 90% of the time - Needs monitoring or encouragement for participation or interaction.  Problem Solving Problem solving assist level: Solves basic 90% of the time/requires cueing < 10% of the time  Memory Memory assist level: Recognizes or recalls 50 - 74% of the time/requires cueing 25 - 49% of the time   Medical Problem List and Plan: 1. 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 LLE DVT Prophylaxis/Anticoagulation: Pharmaceutical: Coumadin therapeutic, d/c SCD Stop plavix since coumadin therapeutic, INR 2.44 3. Pain Management: On Neurontin at nights for neuropathy? Continue tylenol prn no c/os of foot pain 4. Mood: LCSW to follow up for evaluation and support.  5. Neuropsych: This patient is capable of making decisions on her own behalf. 6. Skin/Wound Care: Routine pressure relief measures 7. Fluids/Electrolytes/Nutrition: Continue 1200 cc FR daily.  8. CAD: Treat medically and monitor for symptoms/tolerance with increase in activity. 9. HTN: monitor BP bid on lisinopril and coreg. Adjusting per Nephro 10. ESRD: Nephrology to follow for assistance. Continue HD MWF in afternoons to help with tolerance of therapy during the day.  -AVG site without further bleeding, difficulty removing adequate volume due to low BPs   11. DM type 2: Used novolog bid prn at home. May benefit from scheduled insulin dosing. Will follow for pattern and  Continue to monitor BS ac/hs and use SSI for elevated BS.  -will introduce HS lantus 11/27, 11/29 am CBG 125 12. Anemia of chronic disease: On Aranesp. Monitor and transfuse prn symptoms or Hgb less than 7.0--hgb stable at  8.4  13. Thrombocytopenia: Monitoring closely with heparin on board. 14. PVD: On plavix due to multiple stents left iliac vein. --see  above LOS (Days) 5 A FACE TO FACE EVALUATION WAS PERFORMED  Charlett Blake 02/13/2015 6:54 AM

## 2015-02-13 NOTE — Progress Notes (Signed)
Occupational Therapy Session Note  Patient Details  Name: Stefanie [name] MRN: [id number] Date of Birth: 1946/03/13  Today's Date: 02/13/2015 OT Individual Time: 1018-1101 and 1130-1200 OT Individual Time Calculation (min): 43 min and 30 min   Short Term Goals: Week 1:  OT Short Term Goal 1 (Week 1): Pt will perform LB bathing with supervision shower level sit to stand. OT Short Term Goal 2 (Week 1): Pt will perform LB dressing with supervision sit to stand.  OT Short Term Goal 3 (Week 1): Pt will perform toilet transfers and toileting with supervision sit to stand.  OT Short Term Goal 4 (Week 1): Pt will perform simple snack prep with RW and close supervision.   Skilled Therapeutic Interventions/Progress Updates:    1) ADL retraining with focus on functional transfers, sit > stand, and increased body awareness with self-care tasks.  Pt more alert this session and agreeable to bathing at shower level.  Performed stand pivot transfer to shower chair with min assist with use of grab bars.  Min assist/contact guard throughout bathing tasks due to extreme lateral lean to Rt in sitting to wash buttocks and quick movements with bending and reaching.  Pt with increased ability to complete sit > stand at supervision level this session while completing LB dressing.    2) Treatment session with focus on sit > stand, upright standing balance, and standing tolerance.  Pt ambulated approx 20 feet with Rollator with min/contact guard with improved upright standing posture.  Engaged in standing task at mirror to provide additional visual input for standing posture.  Pt tolerated standing approx 3 mins x3 with prolonged seated rest break between each stand.  Pt demonstrating improved activity tolerance and strength with sit > stand with supervision only this session.  Therapy Documentation Precautions:  Precautions Precautions: Fall Precaution Comments: history of B CVAs Restrictions Weight Bearing  Restrictions: No Pain: Pain Assessment Pain Assessment: No/denies pain Pain Score: 0-No pain  See Function Navigator for Current Functional Status.   Therapy/Group: Individual Therapy  Simonne Come 02/13/2015, 11:15 AM

## 2015-02-13 NOTE — Progress Notes (Signed)
Physical Therapy Session Note  Patient Details  Name: Stefanie [name] MRN: [id number] Date of Birth: 1945/05/28  Today's Date: 02/13/2015 PT Individual Time: 1102-1132 PT Individual Time Calculation (min): 30 min   Short Term Goals: Week 1:  PT Short Term Goal 1 (Week 1): Pt will ambulate with one person assist x 20' PT Short Term Goal 2 (Week 1): Pt will demonstrate all bed mobility with supervision PT Short Term Goal 3 (Week 1): Pt will demonstrate car transfers with CGA.  PT Short Term Goal 4 (Week 1): Pt will self propel manual w/c x 40' with min A.   Skilled Therapeutic Interventions/Progress Updates:    Pt received resting in w/c and agreeable to therapy session.  Session focus on overall endurance and upright posture.  Pt propelled w/c x20' with mod assist and max verbal cues for obstacle negotiation and maintaining straight path.  Pt performed stand/pivot transfer from w/c>therapy mat with rollator and steady assist with verbal cues for advancing RLE.  PT instructed patient in standing activity moving horseshoes from walker<>basketball rim with focus on upright posture and use of R and L hand for task.  Pt required two seated rest breaks during 10 min activity.  PT instructed patient in 2x10 glute sets in seated position for increased hip extension strength.  Pt amb x25' with rollator and steady assist and positioned self in w/c.  Handoff to OT in therapy gym.    Therapy Documentation Precautions:  Precautions Precautions: Fall Precaution Comments: history of B CVAs Restrictions Weight Bearing Restrictions: No Pain: Pain Assessment Pain Assessment: No/denies pain Pain Score: 0-No pain   See Function Navigator for Current Functional Status.   Therapy/Group: Individual Therapy  Munira Polson E Penven-Crew 02/13/2015, 11:48 AM

## 2015-02-13 NOTE — Progress Notes (Signed)
Social Work Patient ID: Stefanie [name], female   DOB: May 26, 1945, 69 y.o.   MRN: [id number] Met with pt to discuss team conference goals-supervision level and discharge 12/9. She is aware of the team's concerns of being able to do sit to stand on her own or would Not be safe to be home alone. She has a bad knee that has given her problems for years. She wants to wait until next week before deciding on other options. She really wants to go home And not to a NH, from her. Will see how she is doing next week and discuss safe discharge option.

## 2015-02-13 NOTE — Progress Notes (Signed)
Social Work [name], LCSW Social Worker Signed Physical Medicine and Rehabilitation Patient Care Conference 02/13/2015  1:08 PM    Expand All Collapse All   Inpatient RehabilitationTeam Conference and Plan of Care Update Date: 02/13/2015   Time: 11:00 AM     Patient Name: Stefanie [name]       Medical Record Number: [id number]  Date of Birth: 07-09-1945 Sex: Female         Room/Bed: 4M07C/4M07C-01 Payor Info: Payor: MEDICARE / Plan: MEDICARE PART A AND B / Product Type: *No Product type* /    Admitting Diagnosis: debility no st   Admit Date/Time:  02/08/2015  6:39 PM Admission Comments: No comment available   Primary Diagnosis:  Debility Principal Problem: Debility    Patient Active Problem List     Diagnosis  Date Noted   .  History of CVA with residual deficit  02/11/2015   .  Debility  02/08/2015   .  DVT (deep venous thrombosis) ([place])     .  CHF (congestive heart failure) ([place])     .  Chronic diastolic congestive heart failure ([place])     .  ESRD (end stage renal disease) ([place])     .  Congestive dilated cardiomyopathy ([place])     .  Left leg DVT ([place])  02/01/2015   .  Leukocytosis  02/01/2015   .  Elevated troponin  02/01/2015   .  SOB (shortness of breath)  02/01/2015   .  Type 2 diabetes, controlled, with neuropathy ([place])  12/20/2014   .  Pain due to onychomycosis of toenail  12/20/2014   .  Iliac vein stenosis, left  03/23/2014   .  Mechanical complication of other vascular device, implant, and graft  05/19/2013   .  PAD (peripheral artery disease) ([place])  02/24/2012   .  Murmur  12/24/2011   .  Bradycardia-intermittent sinus  07/10/2010   .  DYSPNEA ON EXERTION  02/25/2010   .  CHEST PAIN, EXERTIONAL  02/25/2010   .  DM  02/24/2010   .  PROTEIN MALNUTRITION  02/24/2010   .  HLD (hyperlipidemia)  02/24/2010   .  ANEMIA  02/24/2010   .  Essential hypertension  02/24/2010   .  Cerebral artery occlusion with cerebral infarction ([place])  02/24/2010   .  ESRD on  dialysis ([place])  02/24/2010   .  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 Alysia Penna Social Worker Present: Ovidio Kin, LCSW Nurse Present: Dorien Chihuahua, RN PT Present: Kem Parkinson, PT OT Present: Simonne Come, OT SLP Present: Windell Moulding, SLP PPS Coordinator present : Daiva Nakayama, RN, CRRN        Current Status/Progress  Goal  Weekly Team Focus   Medical     off IV heparin, poor posture, soft BPs with difficulty adequately dialyzing pt  ? home vs SNF  improve activity tolerance and safety awareness    Bowel/Bladder     Continent of bowel; anuric; LBM 11/28   Mod I  Assess and treat for constipation as needed    Swallow/Nutrition/ Hydration       na         ADL's     min-mod assist self-care tasks, mod assist stand pivot transfers, Pt requires increased time to complete all tasks   Supervision overall  Sit > stand, standing tolerance, transfers  Mobility     S bed mobility, mod/max sit <>stand, mod/maxA stand pivot tranfers, min/mod gait with RW  mod I bed mobility, S transfers, gait, stairs   Sit <>stand, transfers, standing balance/posture, gait training   Communication       na         Safety/Cognition/ Behavioral Observations    No unsafe behaviors noted         Pain     No complaints of pain  < 3  Assess and treat for pain q shift and prn   Skin     Skin intact  Min assist  Assess skin q shift and prn      *See Care Plan and progress notes for long and short-term goals.    Barriers to Discharge:  very poor endurance     Possible Resolutions to Barriers:   cont rehab as above     Discharge Planning/Teaching Needs:   Pt lives alone and needs to be mod/i to return home-unsure if she was safe before this admission alone. Has PCS worker daily 2-3 hours        Team Discussion:    Goals set at supervision level-pt  lives alone. Team concerned she will not be able to do sit to stand independently. R-Knee pain and has weakness from past CVA's. Off heprin and on coumadin, anemia stable. Low endurance but has improved while here. Disucss best discharge option   Revisions to Treatment Plan:    Ques home versus NHP    Continued Need for Acute Rehabilitation Level of Care: The patient requires daily medical management by a physician with specialized training in physical medicine and rehabilitation for the following conditions: Daily direction of a multidisciplinary physical rehabilitation program to ensure safe treatment while eliciting the highest outcome that is of practical value to the patient.: Yes Daily medical management of patient stability for increased activity during participation in an intensive rehabilitation regime.: Yes Daily analysis of laboratory values and/or radiology reports with any subsequent need for medication adjustment of medical intervention for : Other  [name] 02/13/2015, 1:08 PM                  Patient ID: Stefanie [name], female   DOB: 17-Jan-1946, 69 y.o.   MRN: [id number]

## 2015-02-13 NOTE — Patient Care Conference (Signed)
Inpatient RehabilitationTeam Conference and Plan of Care Update Date: 02/13/2015   Time: 11:00 AM    Patient Name: Stefanie [name]      Medical Record Number: [id number]  Date of Birth: 09/11/1945 Sex: Female         Room/Bed: 4M07C/4M07C-01 Payor Info: Payor: MEDICARE / Plan: MEDICARE PART A AND B / Product Type: *No Product type* /    Admitting Diagnosis: debility no st  Admit Date/Time:  02/08/2015  6:39 PM Admission Comments: No comment available   Primary Diagnosis:  Debility Principal Problem: Debility  Patient Active Problem List   Diagnosis Date Noted  . History of CVA with residual deficit 02/11/2015  . Debility 02/08/2015  . DVT (deep venous thrombosis) ([place])   . CHF (congestive heart failure) ([place])   . Chronic diastolic congestive heart failure ([place])   . ESRD (end stage renal disease) ([place])   . Congestive dilated cardiomyopathy ([place])   . Left leg DVT ([place]) 02/01/2015  . Leukocytosis 02/01/2015  . Elevated troponin 02/01/2015  . SOB (shortness of breath) 02/01/2015  . Type 2 diabetes, controlled, with neuropathy ([place]) 12/20/2014  . Pain due to onychomycosis of toenail 12/20/2014  . 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 Mechanical complication of other vascular device, implant, and graft 05/19/2013  . PAD (peripheral artery disease) ([place]) 02/24/2012  . Murmur 12/24/2011  . Bradycardia-intermittent sinus 07/10/2010  . DYSPNEA ON EXERTION 02/25/2010  . CHEST PAIN, EXERTIONAL 02/25/2010  . DM 02/24/2010  . PROTEIN MALNUTRITION 02/24/2010  . HLD (hyperlipidemia) 02/24/2010  . ANEMIA 02/24/2010  . Essential hypertension 02/24/2010  . Cerebral artery occlusion with cerebral infarction ([place]) 02/24/2010  . ESRD on dialysis ([place]) 02/24/2010  . 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 Alysia Penna Social Worker Present: Ovidio Kin, LCSW Nurse Present: Dorien Chihuahua, RN PT Present: Kem Parkinson, PT OT Present: Simonne Come, OT SLP Present: Windell Moulding, SLP PPS Coordinator present : Daiva Nakayama, RN, CRRN     Current Status/Progress Goal Weekly Team Focus  Medical   off IV heparin, poor posture, soft BPs with difficulty adequately dialyzing pt  ? home vs SNF  improve activity tolerance and safety awareness   Bowel/Bladder   Continent of bowel; anuric; LBM 11/28  Mod I  Assess and treat for constipation as needed   Swallow/Nutrition/ Hydration     na        ADL's   min-mod assist self-care tasks, mod assist stand pivot transfers, Pt requires increased time to complete all tasks  Supervision overall  Sit > stand, standing tolerance, transfers   Mobility   S bed mobility, mod/max sit <>stand, mod/maxA stand pivot tranfers, min/mod gait with RW  mod I bed mobility, S transfers, gait, stairs  Sit <>stand, transfers, standing balance/posture, gait training   Communication     na        Safety/Cognition/ Behavioral Observations  No unsafe behaviors noted         Pain   No complaints of pain  < 3  Assess and treat for pain q shift and prn   Skin   Skin intact  Min assist  Assess skin q shift and prn      *See Care Plan and progress notes for long and short-term goals.  Barriers to Discharge: very poor endurance    Possible Resolutions to Barriers:  cont rehab as above  Discharge Planning/Teaching Needs:  Pt lives alone and needs to be mod/i to return home-unsure if she was safe before this admission alone. Has PCS worker daily 2-3 hours      Team Discussion:  Goals set at supervision level-pt lives alone. Team concerned she will not be able to do sit to stand independently. R-Knee pain and has weakness from past CVA's. Off heprin and on coumadin, anemia stable. Low endurance but has improved while here. Disucss best discharge option  Revisions to Treatment Plan:  Ques home versus NHP    Continued Need for Acute Rehabilitation Level of Care: The patient requires daily medical management by a physician with specialized training in physical medicine and rehabilitation for the following conditions: Daily direction of a multidisciplinary physical rehabilitation program to ensure safe treatment while eliciting the highest outcome that is of practical value to the patient.: Yes Daily medical management of patient stability for increased activity during participation in an intensive rehabilitation regime.: Yes Daily analysis of laboratory values and/or radiology reports with any subsequent need for medication adjustment of medical intervention for : Other  [name] 02/13/2015, 1:08 PM

## 2015-02-14 ENCOUNTER — Inpatient Hospital Stay (HOSPITAL_COMMUNITY): Payer: Medicare Other

## 2015-02-14 LAB — IRON AND TIBC
IRON: 29 ug/dL (ref 28–170)
SATURATION RATIOS: 16 % (ref 10.4–31.8)
TIBC: 179 ug/dL — ABNORMAL LOW (ref 250–450)
UIBC: 150 ug/dL

## 2015-02-14 LAB — PROTIME-INR
INR: 2.47 — AB (ref 0.00–1.49)
PROTHROMBIN TIME: 26.4 s — AB (ref 11.6–15.2)

## 2015-02-14 LAB — RENAL FUNCTION PANEL
ALBUMIN: 2.8 g/dL — AB (ref 3.5–5.0)
ANION GAP: 9 (ref 5–15)
BUN: 46 mg/dL — ABNORMAL HIGH (ref 6–20)
CHLORIDE: 95 mmol/L — AB (ref 101–111)
CO2: 29 mmol/L (ref 22–32)
Calcium: 9.2 mg/dL (ref 8.9–10.3)
Creatinine, Ser: 9.09 mg/dL — ABNORMAL HIGH (ref 0.44–1.00)
GFR calc Af Amer: 5 mL/min — ABNORMAL LOW (ref >60)
GFR calc non Af Amer: 4 mL/min — ABNORMAL LOW (ref >60)
GLUCOSE: 190 mg/dL — AB (ref 65–99)
PHOSPHORUS: 4.5 mg/dL (ref 2.5–4.6)
POTASSIUM: 4.7 mmol/L (ref 3.5–5.1)
Sodium: 133 mmol/L — ABNORMAL LOW (ref 135–145)

## 2015-02-14 LAB — FERRITIN: FERRITIN: 51 ng/mL (ref 11–307)

## 2015-02-14 LAB — GLUCOSE, CAPILLARY
GLUCOSE-CAPILLARY: 149 mg/dL — AB (ref 65–99)
GLUCOSE-CAPILLARY: 186 mg/dL — AB (ref 65–99)
Glucose-Capillary: 107 mg/dL — ABNORMAL HIGH (ref 65–99)
Glucose-Capillary: 153 mg/dL — ABNORMAL HIGH (ref 65–99)

## 2015-02-14 LAB — CBC
HEMATOCRIT: 25.8 % — AB (ref 36.0–46.0)
HEMOGLOBIN: 8.6 g/dL — AB (ref 12.0–15.0)
MCH: 33.2 pg (ref 26.0–34.0)
MCHC: 33.3 g/dL (ref 30.0–36.0)
MCV: 99.6 fL (ref 78.0–100.0)
Platelets: 132 10*3/uL — ABNORMAL LOW (ref 150–400)
RBC: 2.59 MIL/uL — ABNORMAL LOW (ref 3.87–5.11)
RDW: 17.5 % — AB (ref 11.5–15.5)
WBC: 6.3 10*3/uL (ref 4.0–10.5)

## 2015-02-14 MED ORDER — INSULIN GLARGINE 100 UNIT/ML ~~LOC~~ SOLN
10.0000 [IU] | Freq: Every day | SUBCUTANEOUS | Status: DC
  Administered 2015-02-14 – 2015-02-24 (×11): 10 [IU] via SUBCUTANEOUS
  Filled 2015-02-14 (×13): qty 0.1

## 2015-02-14 MED ORDER — CALCIUM POLYCARBOPHIL 625 MG PO TABS
1250.0000 mg | ORAL_TABLET | Freq: Every day | ORAL | Status: DC
  Administered 2015-02-14 – 2015-02-25 (×12): 1250 mg via ORAL
  Filled 2015-02-14 (×13): qty 2

## 2015-02-14 MED ORDER — SACCHAROMYCES BOULARDII 250 MG PO CAPS
250.0000 mg | ORAL_CAPSULE | Freq: Two times a day (BID) | ORAL | Status: DC
  Administered 2015-02-14 – 2015-02-25 (×23): 250 mg via ORAL
  Filled 2015-02-14 (×23): qty 1

## 2015-02-14 MED ORDER — WARFARIN SODIUM 5 MG PO TABS
5.0000 mg | ORAL_TABLET | Freq: Once | ORAL | Status: AC
  Administered 2015-02-15: 5 mg via ORAL
  Filled 2015-02-14: qty 1

## 2015-02-14 NOTE — Progress Notes (Signed)
ANTICOAGULATION CONSULT NOTE - Follow Up Consult  Pharmacy Consult for coumadin Indication: DVT  No Known Allergies  Patient Measurements: Weight: 178 lb 4.8 oz (80.876 kg) Heparin Dosing Weight:   Vital Signs: Temp: 99 F (37.2 C) (12/01 0544) Temp Source: Oral (12/01 0544) BP: 122/55 mmHg (12/01 0544) Pulse Rate: 60 (12/01 0544)  Labs:  Recent Labs  02/12/15 0514 02/12/15 1300 02/13/15 0415 02/14/15 0503  HGB 8.4*  --   --   --   HCT 25.8*  --   --   --   PLT 159  --   --   --   LABPROT 25.5*  --  26.2* 26.4*  INR 2.35*  --  2.44* 2.47*  HEPARINUNFRC 0.32  --   --   --   CREATININE  --  11.15*  --   --     Estimated Creatinine Clearance: 4.9 mL/min (by C-G formula based on Cr of 11.15).   Medications:  Scheduled:  . antiseptic oral rinse  7 mL Mouth Rinse BID  . calcitRIOL  1.5 mcg Oral Q T,Th,Sa-HD  . calcium acetate  667 mg Oral TID WC  . carvedilol  3.125 mg Oral BID WC  . cinacalcet  90 mg Oral Q breakfast  . darbepoetin (ARANESP) injection - DIALYSIS  100 mcg Intravenous Q Tue-HD  . gabapentin  300 mg Oral QHS  . insulin aspart  0-5 Units Subcutaneous QHS  . insulin aspart  0-9 Units Subcutaneous TID WC  . insulin glargine  10 Units Subcutaneous QHS  . ketoconazole  1 application Topical Daily  . latanoprost  1 drop Left Eye QHS  . multivitamin  1 tablet Oral QHS  . pantoprazole  40 mg Oral Daily  . polycarbophil  1,250 mg Oral Daily  . polyethylene glycol  17 g Oral Daily  . pravastatin  40 mg Oral Daily  . rOPINIRole  0.5 mg Oral QHS  . saccharomyces boulardii  250 mg Oral BID  . Warfarin - Pharmacist Dosing Inpatient   Does not apply q1800   Assessment: 69 yo female with DVT is currently on coumadin. INR therapeutic at 2.47. hgb 8.4, plt 159K, stable. No bleeding noted per chart.  Goal of Therapy:  INR = 2-3 Monitor platelets by anticoagulation protocol: Yes   Plan:  - Coumadin 5 mg po today, might need alternating 5/7.5 mg daily - Daily  PT/INR  Maryanna Shape, PharmD, BCPS  Clinical Pharmacist  Pager: [phone]   02/14/2015,12:58 PM

## 2015-02-14 NOTE — Progress Notes (Signed)
[place] PHYSICAL MEDICINE & REHABILITATION     PROGRESS NOTE    Subjective/Complaints: No cough or wheezing last noc, gets winded with ambulation in PT  ROS: Pt denies fever, rash/itching, headache, blurred or double vision, nausea, vomiting, abdominal pain, diarrhea, chest pain, shortness of breath,  Objective: Vital Signs: Blood pressure 122/55, pulse 60, temperature 99 F (37.2 C), temperature source Oral, resp. rate 18, weight 80.876 kg (178 lb 4.8 oz), SpO2 100 %. Dg Chest 2 View  02/12/2015  CLINICAL DATA:  Short of breath EXAM: CHEST  2 VIEW COMPARISON:  02/02/2015 FINDINGS: Stable linear metal object projecting over the right apex. Moderate cardiomegaly. Lungs under aerated and clear. No pleural effusion. No pneumothorax. Vascular congestion. IMPRESSION: Cardiomegaly and vascular congestion. Electronically Signed   By: Marybelle Killings M.D.   On: 02/12/2015 20:44    Recent Labs  02/11/15 0720 02/12/15 0514  WBC 4.1 5.1  HGB 8.6* 8.4*  HCT 25.8* 25.8*  PLT 167 159    Recent Labs  02/12/15 1300  NA 128*  K 5.4*  CL 93*  GLUCOSE 165*  BUN 63*  CREATININE 11.15*  CALCIUM 9.9   CBG (last 3)   Recent Labs  02/13/15 1653 02/13/15 2049 02/14/15 0630  GLUCAP 110* 158* 149*    Wt Readings from Last 3 Encounters:  02/14/15 80.876 kg (178 lb 4.8 oz)  02/08/15 82.4 kg (181 lb 10.5 oz)  03/23/14 88.451 kg (195 lb)    Physical Exam:  Constitutional: She is oriented to person, place, and time. She appears well-developed and well-nourished.  HENT:  Head: Normocephalic and atraumatic.  Mouth/Throat: Oropharynx is clear and moist.  Eyes: Conjunctivae are normal. Pupils are equal, round, and reactive to light.  Neck: Normal range of motion. Neck supple.  Cardiovascular: Normal rate and regular rhythm.  No murmur heard. Respiratory: Effort normal and breath sounds normal. No respiratory distress. She has no wheezes.  GI: Soft. She exhibits no distension.  There is no tenderness.  Musculoskeletal: She exhibits no edema. She exhibits no tenderness.  Left thigh/AVG---removed dressing no bleeding No pain with palpation of LLE. Has some decreased sensation RLE.  Neurological: She is alert and oriented to person, place, and time.  Speech dysarthric (edentulous) and able to follow basic commands without difficulty. Sensory deficits RLE? Does sense pain in foot/ankle area. UE:RUE 3- delt, bi, tri, grip, 5/5 on left side, 4/5 BLE Skin: Skin is warm and dry. No rash noted. No erythema.  Psychiatric: She has a normal mood and affect. Her behavior is normal. Thought content normal.   Assessment/Plan: 1. Weakness and balance deficits secondary to multiple medical issues/debility which require 3+ hours per day of interdisciplinary therapy in a comprehensive inpatient rehab setting. Physiatrist is providing close team supervision and 24 hour management of active medical problems listed below. Physiatrist and rehab team continue to assess barriers to discharge/monitor patient progress toward functional and medical goals. .Function:  Bathing Bathing position   Position: Shower  Bathing parts Body parts bathed by patient: Right arm, Left arm, Chest, Abdomen, Front perineal area, Buttocks, Right upper leg, Left upper leg, Right lower leg, Left lower leg Body parts bathed by helper: Back, Buttocks  Bathing assist Assist Level: Touching or steadying assistance(Pt > 75%)      Upper Body Dressing/Undressing Upper body dressing   What is the patient wearing?: Pull over shirt/dress     Pull over shirt/dress - Perfomed by patient: Thread/unthread right sleeve, Thread/unthread left sleeve, Put head  through opening, Pull shirt over trunk          Upper body assist Assist Level: Set up   Set up : To obtain clothing/put away  Lower Body Dressing/Undressing Lower body dressing   What is the patient wearing?: Pants, Socks, Shoes, Underwear Underwear -  Performed by patient: Thread/unthread right underwear leg, Thread/unthread left underwear leg Underwear - Performed by helper: Pull underwear up/down Pants- Performed by patient: Thread/unthread right pants leg, Thread/unthread left pants leg Pants- Performed by helper: Pull pants up/down Non-skid slipper socks- Performed by patient: Don/doff right sock, Don/doff left sock Non-skid slipper socks- Performed by helper: Don/doff right sock, Don/doff left sock Socks - Performed by patient: Don/doff right sock, Don/doff left sock   Shoes - Performed by patient: Don/doff right shoe, Don/doff left shoe            Lower body assist Assist for lower body dressing: Touching or steadying assistance (Pt > 75%)      Toileting Toileting   Toileting steps completed by patient: Performs perineal hygiene, Adjust clothing after toileting, Adjust clothing prior to toileting Toileting steps completed by helper: Adjust clothing after toileting, Adjust clothing prior to toileting, Performs perineal hygiene Toileting Assistive Devices: Grab bar or rail  Toileting assist Assist level: Touching or steadying assistance (Pt.75%)   Transfers Chair/bed transfer   Chair/bed transfer method: Ambulatory Chair/bed transfer assist level: Touching or steadying assistance (Pt > 75%) Chair/bed transfer assistive device: Armrests, Medical sales representative     Max distance: 80 Assist level: Touching or steadying assistance (Pt > 75%)   Wheelchair   Type: Manual Max wheelchair distance: 20 Assist Level: Moderate assistance (Pt 50 - 74%)  Cognition Comprehension Comprehension assist level: Understands complex 90% of the time/cues 10% of the time  Expression Expression assist level: Expresses basic 75 - 89% of the time/requires cueing 10 - 24% of the time. Needs helper to occlude trach/needs to repeat words.  Social Interaction Social Interaction assist level: Interacts appropriately 90% of the time -  Needs monitoring or encouragement for participation or interaction.  Problem Solving Problem solving assist level: Solves basic 90% of the time/requires cueing < 10% of the time  Memory Memory assist level: Recognizes or recalls 50 - 74% of the time/requires cueing 25 - 49% of the time   Medical Problem List and Plan: 1. 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 LLE DVT Prophylaxis/Anticoagulation: Pharmaceutical: Coumadin therapeutic, d/c SCD Stop plavix since coumadin therapeutic, INR 2.44 3. Pain Management: On Neurontin at nights for neuropathy? Continue tylenol prn no c/os of foot pain 4. Mood: LCSW to follow up for evaluation and support.  5. Neuropsych: This patient is capable of making decisions on her own behalf. 6. Skin/Wound Care: Routine pressure relief measures 7. Fluids/Electrolytes/Nutrition: Continue 1200 cc FR daily.  8. CAD: Treat medically and monitor for symptoms/tolerance with increase in activity. 9. HTN: monitor BP bid on lisinopril and coreg. Adjusting per Nephro 10. ESRD: Appreciate Nephro notes. Continue HDT,Th, Sa in afternoons to help with tolerance of therapy during the day.  -AVG site without further bleeding, difficulty removing adequate volume due to low BPs   11. DM type 2: Used novolog bid prn at home. May benefit from scheduled insulin dosing. Will follow for pattern and Continue to monitor BS ac/hs and use SSI for elevated BS.  -will introduce HS lantus 11/27, 12/1 am CBG 149,  will increase 12. Anemia of chronic disease: On Aranesp. Monitor and transfuse prn symptoms or Hgb less than 7.0--hgb stable at  8.4  13. Thrombocytopenia: Monitoring closely with heparin on board. 14. PVD: Off plavix ,multiple stents left iliac vein. --now on warfarin LOS (Days) 6 A FACE TO FACE EVALUATION WAS PERFORMED  Charlett Blake 02/14/2015 6:49 AM

## 2015-02-14 NOTE — Progress Notes (Signed)
Physical Therapy Session Note  Patient Details  Name: Stefanie [name] MRN: [id number] Date of Birth: August 24, 1945  Today's Date: 02/14/2015 PT Individual Time: 1400-1430 PT Individual Time Calculation (min): 30 min   Short Term Goals: Week 1:  PT Short Term Goal 1 (Week 1): Pt will ambulate with one person assist x 20' PT Short Term Goal 2 (Week 1): Pt will demonstrate all bed mobility with supervision PT Short Term Goal 3 (Week 1): Pt will demonstrate car transfers with CGA.  PT Short Term Goal 4 (Week 1): Pt will self propel manual w/c x 40' with min A.   Skilled Therapeutic Interventions/Progress Updates:   Session focused on therex for functional strengthening, functional transfers and dynamic standing balance. Pt instructed in seated heel/toe raises x 15 reps each, seated LAQ with 5 second hold x 15 reps each, marches x 15 reps each, and standing R hip abduction x 10 reps (to tolerance, and unable with LLE due to pain decreased strength in RLE). Pt reports needing to have BM, so returned to room via w/c and assisted with toileting needs with overall min assist for transfers and standing balance while performing clothing management in standing. Used rollator to ambulate in and out of bathroom with steady assist and cues for upright posture and safety with rollator.   Therapy Documentation Precautions:  Precautions Precautions: Fall Precaution Comments: history of B CVAs Restrictions Weight Bearing Restrictions: No  Pain:  Denies pain.    See Function Navigator for Current Functional Status.   Therapy/Group: Individual Therapy  Canary Brim Ivory Broad, PT, DPT  02/14/2015, 3:21 PM

## 2015-02-14 NOTE — Progress Notes (Signed)
Occupational Therapy Session Note  Patient Details  Name: [name] Ranney MRN: [id number] Date of Birth: [birth year]  Today's Date: 02/14/2015 OT Individual Time: 1000-1100 OT Individual Time Calculation (min): 60 min    Short Term Goals: Week 1:  OT Short Term Goal 1 (Week 1): Pt will perform LB bathing with supervision shower level sit to stand. OT Short Term Goal 2 (Week 1): Pt will perform LB dressing with supervision sit to stand.  OT Short Term Goal 3 (Week 1): Pt will perform toilet transfers and toileting with supervision sit to stand.  OT Short Term Goal 4 (Week 1): Pt will perform simple snack prep with RW and close supervision.   Skilled Therapeutic Interventions/Progress Updates:    Pt resting in w/c upon arrival.  Pt stated she had on clean clothing and did not need to wash up this morning.  Pt transitioned to therapy gym and engaged in dynamic standing activities (washing mirror with wash cloth, playing Connect 4, folding linens, tossing bean bags) and functional amb with RW to place linens in dirty linen bag and retrieving bean bags from floor with reacher.  Pt required min verbal cues to correct standing posture.  Pt required multiple rest breaks throughout session.  Focus on activity tolerance, dynamic standing balance, functional amb with RW, and safety awareness to increased independence with BADLs.  Therapy Documentation Precautions:  Precautions Precautions: Fall Precaution Comments: history of B CVAs Restrictions Weight Bearing Restrictions: No  Pain:  Pt c/o 5/10 pain in Lt knee; repositioned, emotional support  See Function Navigator for Current Functional Status.   Therapy/Group: Individual Therapy  Leroy Libman 02/14/2015, 11:05 AM

## 2015-02-14 NOTE — Progress Notes (Signed)
Occupational Therapy Note  Patient Details  Name: [name] Kandel MRN: [id number] Date of Birth: April 23, 1945  Today's Date: 02/14/2015 OT Individual Time: 1300-1400 OT Individual Time Calculation (min): 60 min   Pt c/o 4/10 pain in Lt knee; repositioned Individual Therapy  Pt resting in bed upon arrival and amb with Rollator to w/c in room.  Pt required extra time to perform sit->stand from EOB.  Pt stated her Lt knee was hurting more this afternoon.  Pt transitioned to gym and initially engaged in dynamic standing activities with multiple rest breaks.  Sit<>stand progressively required less assistance and task was supervision by end of session.  Pt transferred to mat and engaged in card games while sitting unsupported.  Pt noted with posterior lean and required mod verbal cues to correct sitting balance.  Pt returned to w/c awaiting PT.  Focus on dynamic sitting and standing balance while engaged in functional tasks to increase activity tolerance to increase independence with BADLs.   Leotis Shames [facility] 02/14/2015, 2:51 PM

## 2015-02-14 NOTE — Progress Notes (Signed)
Physical Therapy Session Note  Patient Details  Name: Stefanie [name] MRN: [id number] Date of Birth: February 09, 1946  Today's Date: 02/14/2015 PT Individual Time: 0900-1000 PT Individual Time Calculation (min): 60 min   Short Term Goals: Week 1:  PT Short Term Goal 1 (Week 1): Pt will ambulate with one person assist x 20' PT Short Term Goal 2 (Week 1): Pt will demonstrate all bed mobility with supervision PT Short Term Goal 3 (Week 1): Pt will demonstrate car transfers with CGA.  PT Short Term Goal 4 (Week 1): Pt will self propel manual w/c x 40' with min A.   Skilled Therapeutic Interventions/Progress Updates:    Pt quite sleepy during session requiring intermittent verbal cues to stay awake. Session focused on functional w/c mobility for endurance and mobility training with cues for efficient technique, postural control and neuro re-ed for balance training, upright posture, and trunk elongation/shortening in seated and standing while performing functional UE manipulation and reaching task with clothespins with overall min assist for balance and sit to stands, gait training with rollator with focus on endurance, upright posture, and safe management of rollator x 120' with 1 standing rest break, and Nustep for reciprocal movement pattern re-training and overall strengthening and endurance x 8 min on level 4. Pt overall min assist during session for functional mobility and cues for safe management of rollator (locking brakes) needed.  Therapy Documentation Precautions:  Precautions Precautions: Fall Precaution Comments: history of B CVAs Restrictions Weight Bearing Restrictions: No   Pain:  R knee discomfort - rest breaks as needed.    See Function Navigator for Current Functional Status.   Therapy/Group: Individual Therapy  Canary Brim Ivory Broad, PT, DPT  02/14/2015, 10:13 AM

## 2015-02-15 ENCOUNTER — Inpatient Hospital Stay (HOSPITAL_COMMUNITY): Payer: Medicare Other | Admitting: Physical Therapy

## 2015-02-15 ENCOUNTER — Inpatient Hospital Stay (HOSPITAL_COMMUNITY): Payer: Medicare Other | Admitting: Occupational Therapy

## 2015-02-15 LAB — GLUCOSE, CAPILLARY
GLUCOSE-CAPILLARY: 150 mg/dL — AB (ref 65–99)
GLUCOSE-CAPILLARY: 99 mg/dL (ref 65–99)
GLUCOSE-CAPILLARY: 212 mg/dL — AB (ref 65–99)
Glucose-Capillary: 127 mg/dL — ABNORMAL HIGH (ref 65–99)
Glucose-Capillary: 149 mg/dL — ABNORMAL HIGH (ref 65–99)

## 2015-02-15 LAB — PROTIME-INR
INR: 2.32 — AB (ref 0.00–1.49)
Prothrombin Time: 25.2 seconds — ABNORMAL HIGH (ref 11.6–15.2)

## 2015-02-15 MED ORDER — WARFARIN SODIUM 7.5 MG PO TABS
7.5000 mg | ORAL_TABLET | Freq: Once | ORAL | Status: AC
  Administered 2015-02-15: 7.5 mg via ORAL
  Filled 2015-02-15: qty 1

## 2015-02-15 NOTE — Progress Notes (Signed)
[place] PHYSICAL MEDICINE & REHABILITATION     PROGRESS NOTE    Subjective/Complaints: Slept poorly, was a HD late  ROS: Pt denies fever, rash/itching, headache, blurred or double vision, nausea, vomiting, abdominal pain, diarrhea, chest pain, shortness of breath,  Objective: Vital Signs: Blood pressure 113/57, pulse 63, temperature 99.2 F (37.3 C), temperature source Oral, resp. rate 16, weight 81.965 kg (180 lb 11.2 oz), SpO2 100 %. No results found.  Recent Labs  02/14/15 2216  WBC 6.3  HGB 8.6*  HCT 25.8*  PLT 132*    Recent Labs  02/12/15 1300 02/14/15 2217  NA 128* 133*  K 5.4* 4.7  CL 93* 95*  GLUCOSE 165* 190*  BUN 63* 46*  CREATININE 11.15* 9.09*  CALCIUM 9.9 9.2   CBG (last 3)   Recent Labs  02/14/15 2057 02/15/15 0146 02/15/15 0636  GLUCAP 186* 150* 127*    Wt Readings from Last 3 Encounters:  02/15/15 81.965 kg (180 lb 11.2 oz)  02/08/15 82.4 kg (181 lb 10.5 oz)  03/23/14 88.451 kg (195 lb)    Physical Exam:  Constitutional: She is oriented to person, place, and time. She appears well-developed and well-nourished.  HENT:  Head: Normocephalic and atraumatic.  Mouth/Throat: Oropharynx is clear and moist.  Eyes: Conjunctivae are normal. Pupils are equal, round, and reactive to light.  Neck: Normal range of motion. Neck supple.  Cardiovascular: Normal rate and regular rhythm.  No murmur heard. Respiratory: Effort normal and breath sounds normal. No respiratory distress. She has no wheezes.  GI: Soft. She exhibits no distension. There is no tenderness.  Musculoskeletal: She exhibits no edema. She exhibits no tenderness.  Left thigh/AVG---removed dressing no bleeding No pain with palpation of LLE. Has some decreased sensation RLE.  Neurological: She is alert and oriented to person, place, and time.  Speech dysarthric (edentulous) and able to follow basic commands without difficulty. Sensory deficits RLE? Does sense pain in  foot/ankle area. UE:RUE 3- delt, bi, tri, grip, 5/5 on left side, 4/5 BLE Skin: Skin is warm and dry. No rash noted. No erythema.  Psychiatric: She has a normal mood and affect. Her behavior is normal. Thought content normal.   Assessment/Plan: 1. Weakness and balance deficits secondary to multiple medical issues/debility which require 3+ hours per day of interdisciplinary therapy in a comprehensive inpatient rehab setting. Physiatrist is providing close team supervision and 24 hour management of active medical problems listed below. Physiatrist and rehab team continue to assess barriers to discharge/monitor patient progress toward functional and medical goals. .Function:  Bathing Bathing position   Position: Shower  Bathing parts Body parts bathed by patient: Right arm, Left arm, Chest, Abdomen, Front perineal area, Buttocks, Right upper leg, Left upper leg, Right lower leg, Left lower leg Body parts bathed by helper: Back, Buttocks  Bathing assist Assist Level: Touching or steadying assistance(Pt > 75%)      Upper Body Dressing/Undressing Upper body dressing   What is the patient wearing?: Pull over shirt/dress     Pull over shirt/dress - Perfomed by patient: Thread/unthread right sleeve, Thread/unthread left sleeve, Put head through opening, Pull shirt over trunk          Upper body assist Assist Level: Set up   Set up : To obtain clothing/put away  Lower Body Dressing/Undressing Lower body dressing   What is the patient wearing?: Pants, Socks, Shoes, Underwear Underwear - Performed by patient: Thread/unthread right underwear leg, Thread/unthread left underwear leg Underwear - Performed by  helper: Pull underwear up/down Pants- Performed by patient: Thread/unthread right pants leg, Thread/unthread left pants leg Pants- Performed by helper: Pull pants up/down Non-skid slipper socks- Performed by patient: Don/doff right sock, Don/doff left sock Non-skid slipper socks- Performed  by helper: Don/doff right sock, Don/doff left sock Socks - Performed by patient: Don/doff right sock, Don/doff left sock   Shoes - Performed by patient: Don/doff right shoe, Don/doff left shoe            Lower body assist Assist for lower body dressing: Touching or steadying assistance (Pt > 75%)      Toileting Toileting   Toileting steps completed by patient: Performs perineal hygiene, Adjust clothing prior to toileting, Adjust clothing after toileting Toileting steps completed by helper: Adjust clothing after toileting, Adjust clothing prior to toileting, Performs perineal hygiene Toileting Assistive Devices: Grab bar or rail  Toileting assist Assist level: Touching or steadying assistance (Pt.75%)   Transfers Chair/bed transfer   Chair/bed transfer method: Stand pivot Chair/bed transfer assist level: Touching or steadying assistance (Pt > 75%) Chair/bed transfer assistive device: Armrests, Medical sales representative     Max distance: 120 Assist level: Touching or steadying assistance (Pt > 75%)   Wheelchair   Type: Manual Max wheelchair distance: 50 Assist Level: Touching or steadying assistance (Pt > 75%)  Cognition Comprehension Comprehension assist level: Understands complex 90% of the time/cues 10% of the time  Expression Expression assist level: Expresses basic 75 - 89% of the time/requires cueing 10 - 24% of the time. Needs helper to occlude trach/needs to repeat words.  Social Interaction Social Interaction assist level: Interacts appropriately 90% of the time - Needs monitoring or encouragement for participation or interaction.  Problem Solving Problem solving assist level: Solves basic 90% of the time/requires cueing < 10% of the time  Memory Memory assist level: Recognizes or recalls 50 - 74% of the time/requires cueing 25 - 49% of the time   Medical Problem List and Plan: 1. 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 LLE DVT Prophylaxis/Anticoagulation: Pharmaceutical: Coumadin therapeutic, d/c SCD Stop plavix since coumadin therapeutic, INR 2.44 3. Pain Management: On Neurontin at nights for neuropathy? Continue tylenol prn no c/os of foot pain 4. Mood: LCSW to follow up for evaluation and support.  5. Neuropsych: This patient is capable of making decisions on her own behalf. 6. Skin/Wound Care: Routine pressure relief measures 7. Fluids/Electrolytes/Nutrition: Continue 1200 cc FR daily.  8. CAD: Treat medically and monitor for symptoms/tolerance with increase in activity. 9. HTN: monitor BP bid on lisinopril and coreg. Adjusting per Nephro 10. ESRD: Appreciate Nephro notes. Continue HDT,Th, Sa in afternoons to help with tolerance of therapy during the day.  -AVG site without further bleeding, difficulty removing adequate volume due to low BPs   11. DM type 2: Used novolog bid prn at home. May benefit from scheduled insulin dosing. Will follow for pattern and Continue to monitor BS ac/hs and use SSI for elevated BS.  -will introduce HS lantus, 12/2 am CBG 127, will increase 12. Anemia of chronic disease: On Aranesp. Monitor and transfuse prn symptoms or Hgb less than 7.0--hgb stable at  8.4  13. Thrombocytopenia: Monitoring closely with heparin on board. 14. PVD: Off plavix ,multiple stents left iliac vein. --now on warfarin LOS (Days) 7 A FACE TO FACE EVALUATION WAS PERFORMED  Charlett Blake 02/15/2015 7:23 AM

## 2015-02-15 NOTE — Progress Notes (Signed)
Occupational Therapy Weekly Progress Note  Patient Details  Name: Stefanie [name] MRN: [id number] Date of Birth: [birth year]  Beginning of progress report period: February 09, 2015 End of progress report period: February 15, 2015  Today's Date: 02/15/2015 OT Individual Time: 9892-1194 OT Individual Time Calculation (min): 43 min    Patient has met 0 of 4 short term goals.  Pt is making inconsistent progress towards goals with pt fluctuating between supervision with sit > stand and mod assist with no clear pattern to level of assist.  Pt is most consistently a min assist with stand pivot transfers and sit > stand from w/c level.  Pt improves with UE support and pulling up on surfaces/grab bars.  Pt continues to require prolonged rest breaks between episodes of standing.  Recommend pt have 24/7 supervision at d/c due to fluctuations, unsure if pt has 24/7 assist.  Patient continues to demonstrate the following deficits: posterior lean, BLE weakness, decreased endurance and activity tolerance and therefore will continue to benefit from skilled OT intervention to enhance overall performance with BADL and Reduce care partner burden.  Patient progressing toward long term goals..  Continue plan of care.  OT Short Term Goals Week 1:  OT Short Term Goal 1 (Week 1): Pt will perform LB bathing with supervision shower level sit to stand. OT Short Term Goal 1 - Progress (Week 1): Progressing toward goal OT Short Term Goal 2 (Week 1): Pt will perform LB dressing with supervision sit to stand.  OT Short Term Goal 2 - Progress (Week 1): Progressing toward goal OT Short Term Goal 3 (Week 1): Pt will perform toilet transfers and toileting with supervision sit to stand.  OT Short Term Goal 3 - Progress (Week 1): Progressing toward goal OT Short Term Goal 4 (Week 1): Pt will perform simple snack prep with RW and close supervision.  OT Short Term Goal 4 - Progress (Week 1): Progressing toward goal Week 2:  OT  Short Term Goal 1 (Week 2): Pt will perform LB bathing with supervision shower level sit to stand. OT Short Term Goal 2 (Week 2): Pt will perform LB dressing with supervision sit to stand.  OT Short Term Goal 3 (Week 2): Pt will perform toilet transfers and toileting with supervision sit to stand.  OT Short Term Goal 4 (Week 2): Pt will perform simple snack prep with RW and close supervision.   Skilled Therapeutic Interventions/Progress Updates:    ADL retraining completed with focus on functional transfers, sit > stand, and increased safety with self-care tasks.  Attempted sit > stand from EOB to ambulate to shower with Rollator, however pt unable to come to upright standing without Mod assist.  Pt requested to utilize w/c.  Min assist squat pivot transfer to w/c and then into shower with use of grab bar.  Pt with improved sitting balance even when reaching forward to wash feet.  Assist provided to wash buttocks this session at sit > stand level.  Pt reports needing to toilet (having already been incontinent of bowel in the shower).  Stand pivot transfers shower > w/c > 3 in 1 over commode with min assist.  Dressing completed from toilet seat with setup assist and to pull pants over hips.  Therapy Documentation Precautions:  Precautions Precautions: Fall Precaution Comments: history of B CVAs Restrictions Weight Bearing Restrictions: No Pain:  Pt with no c/o pain  See Function Navigator for Current Functional Status.   Therapy/Group: Individual Therapy  Simonne Come 02/15/2015,  9:18 AM

## 2015-02-15 NOTE — Progress Notes (Signed)
ANTICOAGULATION CONSULT NOTE - Follow Up Consult  Pharmacy Consult for Coumadin Indication: DVT  No Known Allergies  Patient Measurements: Weight: 180 lb 11.2 oz (81.965 kg) Heparin Dosing Weight:   Vital Signs: Temp: 98.6 F (37 C) (12/02 1313) Temp Source: Oral (12/02 1313) BP: 134/65 mmHg (12/02 1313) Pulse Rate: 66 (12/02 1313)  Labs:  Recent Labs  02/13/15 0415 02/14/15 0503 02/14/15 2216 02/14/15 2217 02/15/15 0537  HGB  --   --  8.6*  --   --   HCT  --   --  25.8*  --   --   PLT  --   --  132*  --   --   LABPROT 26.2* 26.4*  --   --  25.2*  INR 2.44* 2.47*  --   --  2.32*  CREATININE  --   --   --  9.09*  --     Estimated Creatinine Clearance: 6 mL/min (by C-G formula based on Cr of 9.09).   Medications:  Scheduled:  . antiseptic oral rinse  7 mL Mouth Rinse BID  . calcitRIOL  1.5 mcg Oral Q T,Th,Sa-HD  . calcium acetate  667 mg Oral TID WC  . carvedilol  3.125 mg Oral BID WC  . cinacalcet  90 mg Oral Q breakfast  . darbepoetin (ARANESP) injection - DIALYSIS  100 mcg Intravenous Q Tue-HD  . gabapentin  300 mg Oral QHS  . insulin aspart  0-5 Units Subcutaneous QHS  . insulin aspart  0-9 Units Subcutaneous TID WC  . insulin glargine  10 Units Subcutaneous QHS  . ketoconazole  1 application Topical Daily  . latanoprost  1 drop Left Eye QHS  . multivitamin  1 tablet Oral QHS  . pantoprazole  40 mg Oral Daily  . polycarbophil  1,250 mg Oral Daily  . polyethylene glycol  17 g Oral Daily  . pravastatin  40 mg Oral Daily  . rOPINIRole  0.5 mg Oral QHS  . saccharomyces boulardii  250 mg Oral BID  . Warfarin - Pharmacist Dosing Inpatient   Does not apply q1800    Assessment: 69yo female on Coumadin for new DVT, heparin bridge completed.  INR therapeutic.  Coumadin given late last night, at 0130 today.  No bleeding noted.    Goal of Therapy:  INR 2-3 Monitor platelets by anticoagulation protocol: Yes   Plan:  Coumadin 7.5mg  today Daily INR Watch for  s/s of bleeding  Gracy Bruins, PharmD [facility]

## 2015-02-15 NOTE — Progress Notes (Signed)
Subjective:  No complaints  Objective Vital signs in last 24 hours: Filed Vitals:   02/15/15 0100 02/15/15 0135 02/15/15 0500 02/15/15 1313  BP: 109/47 101/82 113/57 134/65  Pulse: 58 59 63 66  Temp: 99 F (37.2 C) 99.2 F (37.3 C) 99.2 F (37.3 C) 98.6 F (37 C)  TempSrc: Oral Oral Oral Oral  Resp: 18 18 16 18   Weight: 80.5 kg (177 lb 7.5 oz)  81.965 kg (180 lb 11.2 oz)   SpO2: 99% 100% 100% 99%   Weight change: 4.424 kg (9 lb 12 oz) Physical Exam: General: alert NAD Heart: RRR, no rub or mur Lungs: CTA/ nonlabored breathing Abdomen: Obese, soft nt, nd Extremities:Trace bipedal edema Dialysis Access: pos bruit L fem AVGG   [facility] 3.5h 2/2 bath P4 75kg Heparin 4000 L thigh AVG Mircera 50 q 2 wks given 11/8  Calcitriol 1.5 ug Recent: Hgb 10.8 iPTH 690 Ca/P ok  Problem: 1. L femoral vein DVT - on IV heparin./coumadin per Pharmacy/. VVS has seen / thoughts are that clot is distal to AVG. Had normal shuntogram .. stop plavix once she is therapeutic on coumadin. 2. Vol - up 6 kg by wts but not on exam, possibly bed wts inaccurate. Stand for next wt 3. ESRD HD TTS 4. HTN - dc'd lisinopril, cont coreg 5. Anemia - Hgb 8.4 continues to trend downward. Transfuse prn,  tues HD 11/29 given 100 Aranesp / check iron stores next hd 6. Metabolic bone disease - Continue calcitriol/binders/sensipar 7. Nutrition - renal carb mod diet/vitamin- unintentional weight loss - Alb 3.1add breeze suplement 8. DM - per primary. 9. CAD/Elevated Troponin/Acute on Chronic Systolic Dysfunction: Cardiology evaluated.Troponin elevation flat. Echo done 11/19 shows Moderate to severe global reduction in LV function, grade 2 diastolic dysfunction with elevated LV filling pressure. EF 30-35%. High risk-Stress test which led to cardiac cath 02/05/15 revealing severe LV dysfunction out of proportion to CAD now on coreg. multivessel CAD with chronic lesions, nothing acute. Cardiology without  plans for any further intervention.  Plan - HD Saturday  Kelly Splinter MD [facility] pager [phone]    cell [phone] 02/15/2015, 3:19 PM    Labs: Basic Metabolic Panel:  Recent Labs Lab 02/12/15 1300 02/14/15 2217  NA 128* 133*  K 5.4* 4.7  CL 93* 95*  CO2 23 29  GLUCOSE 165* 190*  BUN 63* 46*  CREATININE 11.15* 9.09*  CALCIUM 9.9 9.2  PHOS 5.1* 4.5   Liver Function Tests:  Recent Labs Lab 02/12/15 1300 02/14/15 2217  ALBUMIN 3.1* 2.8*   No results for input(s): LIPASE, AMYLASE in the last 168 hours. No results for input(s): AMMONIA in the last 168 hours. CBC:  Recent Labs Lab 02/09/15 0520 02/10/15 0543 02/11/15 0720 02/12/15 0514 02/14/15 2216  WBC 4.1 4.1 4.1 5.1 6.3  HGB 8.7* 8.8* 8.6* 8.4* 8.6*  HCT 27.0* 26.8* 25.8* 25.8* 25.8*  MCV 101.1* 99.3 99.2 98.5 99.6  PLT 151 133* 167 159 132*   Cardiac Enzymes: No results for input(s): CKTOTAL, CKMB, CKMBINDEX, TROPONINI in the last 168 hours. CBG:  Recent Labs Lab 02/14/15 1617 02/14/15 2057 02/15/15 0146 02/15/15 0636 02/15/15 1116  GLUCAP 153* 186* 150* 127* 99    Studies/Results: No results found. Medications:   . antiseptic oral rinse  7 mL Mouth Rinse BID  . calcitRIOL  1.5 mcg Oral Q T,Th,Sa-HD  . calcium acetate  667 mg Oral TID WC  . carvedilol  3.125 mg Oral BID WC  .  cinacalcet  90 mg Oral Q breakfast  . darbepoetin (ARANESP) injection - DIALYSIS  100 mcg Intravenous Q Tue-HD  . gabapentin  300 mg Oral QHS  . insulin aspart  0-5 Units Subcutaneous QHS  . insulin aspart  0-9 Units Subcutaneous TID WC  . insulin glargine  10 Units Subcutaneous QHS  . ketoconazole  1 application Topical Daily  . latanoprost  1 drop Left Eye QHS  . multivitamin  1 tablet Oral QHS  . pantoprazole  40 mg Oral Daily  . polycarbophil  1,250 mg Oral Daily  . polyethylene glycol  17 g Oral Daily  . pravastatin  40 mg Oral Daily  . rOPINIRole  0.5 mg Oral QHS  . saccharomyces  boulardii  250 mg Oral BID  . warfarin  7.5 mg Oral ONCE-1800  . Warfarin - Pharmacist Dosing Inpatient   Does not apply [phone]

## 2015-02-15 NOTE — Progress Notes (Signed)
Occupational Therapy Session Note  Patient Details  Name: Stefanie [name] MRN: [id number] Date of Birth: September 13, 1945  Today's Date: 02/15/2015 OT Individual Time:  - [id number]  (62 min)      Short Term Goals: Week 1:  OT Short Term Goal 1 (Week 1): Pt will perform LB bathing with supervision shower level sit to stand. OT Short Term Goal 1 - Progress (Week 1): Progressing toward goal OT Short Term Goal 2 (Week 1): Pt will perform LB dressing with supervision sit to stand.  OT Short Term Goal 2 - Progress (Week 1): Progressing toward goal OT Short Term Goal 3 (Week 1): Pt will perform toilet transfers and toileting with supervision sit to stand.  OT Short Term Goal 3 - Progress (Week 1): Progressing toward goal OT Short Term Goal 4 (Week 1): Pt will perform simple snack prep with RW and close supervision.  OT Short Term Goal 4 - Progress (Week 1): Progressing toward goal Week 2:  OT Short Term Goal 1 (Week 2): Pt will perform LB bathing with supervision shower level sit to stand. OT Short Term Goal 2 (Week 2): Pt will perform LB dressing with supervision sit to stand.  OT Short Term Goal 3 (Week 2): Pt will perform toilet transfers and toileting with supervision sit to stand.  OT Short Term Goal 4 (Week 2): Pt will perform simple snack prep with RW and close supervision.   Skilled Therapeutic Interventions/Progress Updates:    Pt sitting in wc. Engaged in functional mobility, sit to stand, BUE there exercises.  OT instructed pt in heel toe stride and upright posture while walking to the bathroom. Performed BUE sho flex, elbow extension, sho abduction x10 with unsupportive trunk.  Did sit to stand using BuE arm rest x5, repeat x2.  OT instructed pt on diaphragmatic breathing,  Left pt in wc with  call bell,phone within reach.     Therapy Documentation Precautions:  Precautions Precautions: Fall Precaution Comments: history of B CVAs Restrictions Weight Bearing Restrictions:  No General:   Vital Signs: Therapy Vitals Temp: 98.6 F (37 C) Temp Source: Oral Pulse Rate: 66 Resp: 18 BP: 134/65 mmHg Patient Position (if appropriate): Sitting Oxygen Therapy SpO2: 99 % O2 Device: Not Delivered Pain:  Right knee 5/10             See Function Navigator for Current Functional Status.   Therapy/Group: Individual Therapy  Lisa Roca 02/15/2015, 4:30 PM

## 2015-02-15 NOTE — Progress Notes (Signed)
Physical Therapy Session Note  Patient Details  Name: Stefanie [name] MRN: [id number] Date of Birth: 04-01-1945  Today's Date: 02/15/2015 PT Individual Time: 1030-1200 and 1500-1530 PT Individual Time Calculation (min): 90 min and 30 min (total 120 min)   Short Term Goals: Week 1:  PT Short Term Goal 1 (Week 1): Pt will ambulate with one person assist x 20' PT Short Term Goal 2 (Week 1): Pt will demonstrate all bed mobility with supervision PT Short Term Goal 3 (Week 1): Pt will demonstrate car transfers with CGA.  PT Short Term Goal 4 (Week 1): Pt will self propel manual w/c x 40' with min A.   Skilled Therapeutic Interventions/Progress Updates:    1030-1200: Pt received seated in w/c asleep, easily arousable and reporting lethargy due to late dialysis treatment last night but agreeable to treatment with encouragement. No c/o pain. Pt assisted with donning RLE knee brace with modA and pt able to verbalize instructions for donning. W/c propulsion x50' with minA for steering due to decreased RUE strength. Stand pivot transfer w/c >mat table min guard. Sit <>stand and standing balance from edge of mat table with LUE reaching overhead to match cards on board, with RUE support on RW. Several rest breaks throughout, and sit <>stand requires variable S>minA and able to perform on initial trial to requiring several attempts. Gait x2 trials of 76' with RW and CGA. Gait speed assessed 0.49 ft/sec. Second trial pt cued for increasing gait speed; noted improvement in speed with same amount of assist, 0.58 ft/sec. Standing endurance and postural retraining while engaged in card game. Utilized card holder to allow for RUE support on RW and LUE to manipulate cards. Several rest breaks for fatigue and S for balance overall, with repetitive cueing for upright posture. Nustep x10 min on level 4 with BUE/BLE with RLE leg strap to reduce adduction, with average 35 steps/min for strengthening and aerobic endurance.  Returned to room in w/c totalA; stand pivot transfer min guard to bed. Sit >supine with S, verbal cues for scooting up in bed. Remained supine in bed with alarm intact and all needs in reach at completion of session.  1500-1530: Pt received supine in bed, agreeable to treatment and no c/o pain. Supine>sit with S, HOB elevated and bedrails. Sit <>stand x5 reps from EOB with rollator with close S/CGA. Pt demonstrated stand pivot transfer to sit on rollator seat; required CGA and stabilizing of rollator to prevent from sliding, forward flexed posture throughout and use of bed footboard for stability during turn. Pt questioned whether she could have done transfer safely alone, pt states yes. Educated pt in assistance provided by therapist, especially stabilizing rollator that would be dangerous if performing alone. Gait x 120' with rollator and CGA; occasional short standing rest breaks. Pt remained seated in w/c with all needs in reach at completion of session.    Therapy Documentation Precautions:  Precautions Precautions: Fall Precaution Comments: history of B CVAs Restrictions Weight Bearing Restrictions: No Pain: Pain Assessment Pain Assessment: No/denies pain Pain Score: 0-No pain   See Function Navigator for Current Functional Status.   Therapy/Group: Individual Therapy  Luberta Mutter 02/15/2015, 12:22 PM

## 2015-02-16 ENCOUNTER — Inpatient Hospital Stay (HOSPITAL_COMMUNITY): Payer: Medicare Other | Admitting: Occupational Therapy

## 2015-02-16 LAB — PROTIME-INR
INR: 2.67 — ABNORMAL HIGH (ref 0.00–1.49)
Prothrombin Time: 28 seconds — ABNORMAL HIGH (ref 11.6–15.2)

## 2015-02-16 LAB — GLUCOSE, CAPILLARY
GLUCOSE-CAPILLARY: 129 mg/dL — AB (ref 65–99)
Glucose-Capillary: 142 mg/dL — ABNORMAL HIGH (ref 65–99)
Glucose-Capillary: 139 mg/dL — ABNORMAL HIGH (ref 65–99)

## 2015-02-16 MED ORDER — PENTAFLUOROPROP-TETRAFLUOROETH EX AERO: 1.0000 "application " | INHALATION_SPRAY | CUTANEOUS | Status: DC | PRN

## 2015-02-16 MED ORDER — WARFARIN SODIUM 7.5 MG PO TABS
7.5000 mg | ORAL_TABLET | ORAL | Status: DC
  Administered 2015-02-17 – 2015-02-21 (×3): 7.5 mg via ORAL
  Filled 2015-02-16 (×3): qty 1

## 2015-02-16 MED ORDER — WARFARIN SODIUM 5 MG PO TABS
5.0000 mg | ORAL_TABLET | ORAL | Status: DC
  Administered 2015-02-16 – 2015-02-20 (×3): 5 mg via ORAL
  Filled 2015-02-16 (×3): qty 1

## 2015-02-16 MED ORDER — HEPARIN SODIUM (PORCINE) 1000 UNIT/ML DIALYSIS
4000.0000 [IU] | Freq: Once | INTRAMUSCULAR | Status: DC
  Filled 2015-02-16: qty 4

## 2015-02-16 MED ORDER — LIDOCAINE-PRILOCAINE 2.5-2.5 % EX CREA
1.0000 "application " | TOPICAL_CREAM | CUTANEOUS | Status: DC | PRN
  Filled 2015-02-16: qty 5

## 2015-02-16 MED ORDER — SODIUM CHLORIDE 0.9 % IV SOLN: 100.0000 mL | INTRAVENOUS | Status: DC | PRN

## 2015-02-16 MED ORDER — LIDOCAINE HCL (PF) 1 % IJ SOLN: 5.0000 mL | INTRAMUSCULAR | Status: DC | PRN

## 2015-02-16 MED ORDER — HEPARIN SODIUM (PORCINE) 1000 UNIT/ML DIALYSIS
1000.0000 [IU] | INTRAMUSCULAR | Status: DC | PRN
  Filled 2015-02-16: qty 1

## 2015-02-16 MED ORDER — ALTEPLASE 2 MG IJ SOLR
2.0000 mg | Freq: Once | INTRAMUSCULAR | Status: DC | PRN
  Filled 2015-02-16: qty 2

## 2015-02-16 NOTE — Progress Notes (Signed)
Occupational Therapy Session Note  Patient Details  Name: Stefanie [name] MRN: [id number] Date of Birth: [birth year]  Today's Date: 02/16/2015 OT Individual Time: 1000-1100 OT Individual Time Calculation (min): 60 min    Short Term Goals: Week 2:  OT Short Term Goal 1 (Week 2): Pt will perform LB bathing with supervision shower level sit to stand. OT Short Term Goal 2 (Week 2): Pt will perform LB dressing with supervision sit to stand.  OT Short Term Goal 3 (Week 2): Pt will perform toilet transfers and toileting with supervision sit to stand.  OT Short Term Goal 4 (Week 2): Pt will perform simple snack prep with RW and close supervision.   Skilled Therapeutic Interventions/Progress Updates:   Patient seen for ADL retraining and instruction this morning with emphasis on standing balance, standing tolerance, activity tolerance, functional mobility, ADL retraining, energy conservation, and hemidressing techniques.  Patient asleep upon therapist arrival.  Patient transfers supine to EOB with supervision.  Dons B slipper grip socks with CGA to close SBA.  Patient transfers EOB to wheelchair via stand pivot transfer with CGA with verbal cues for technique and initiation of LE movement.  Patient completes transfer wheelchair <> BSC over toilet and into shower onto/off of shower seat via stand pivot CGA with use of grab bars.  Patient completes bathing seated with close SBA.  Able to complete forward trunk flexion to reach feet with close SBA.  Patient requires assist to wash back.  Patient dresses wheelchair level at the sink.  UB dressing completed with min assist to fasten first hook of bra.  Patient completes LB dressing with min assist for donning B shoes secondary to not having long handled shoe horn.  Patient able to complete forward trunk flexion to reach feet to don socks/shoes.  Patient educated on energy conservation and hemi dressing techniques for improved overall performance with ADL tasks, as  well as improved safety with ADLs.  Patient grooms at the sink with setup to comb hair and apply lotion to BLEs/BUEs.    Patient with good tolerance for shower today and is highly motivated.  Good participation.   Therapy Documentation Precautions:  Precautions Precautions: Fall Precaution Comments: history of B CVAs Restrictions Weight Bearing Restrictions: No Pain: Pain Assessment Pain Assessment: No/denies pain Pain Intervention(s): Ambulation/increased activity  See Function Navigator for Current Functional Status.   Therapy/Group: Individual Therapy  Osa Craver 02/16/2015, 12:49 PM

## 2015-02-16 NOTE — Progress Notes (Signed)
Patient ID: Stefanie [name], female   DOB: 03-Jul-1945, 69 y.o.   MRN: [id number]  02/16/15.  [place] [facility] [place] PHYSICAL MEDICINE & REHABILITATION     PROGRESS NOTE   69 y/o admit for CIR with functional deficits secondary to Debility with weakness and balance deficits and  Prior Right HP from old CVA mainly affecting arm  Subjective/Complaints: No complaints today  ROS: Pt denies fever, rash/itching, headache, blurred or double vision, nausea, vomiting, abdominal pain, diarrhea, chest pain, shortness of breath,   Past Medical History  Diagnosis Date  . Hypertension   . Hyperlipidemia   . Diabetes mellitus   . CVA (cerebral vascular accident) ([place]) [place]    left sided weakness  . Protein malnutrition ([place])   . Secondary hyperparathyroidism ([place])   . Anemia   . End stage renal disease ([place])     initiated HD 2006  . Hx of echocardiogram     a. Echo 03/2010: EF 55-60%, Gr 1 diast dysfn, trivial MS, mild to mod LAE, PASP 34, ;  b. Echo 11/13: mild LVH, EF 60%, Gr 1 diast dysfn, Ao sclerosis, no AS, MAC, slight MS, mean 3 mmHg, PASP 34  . Hx of cardiovascular stress test     a. MV 03/2010:  no ischemia, EF 55%  . CHF (congestive heart failure) ([place])   . Retinal detachment, bilateral   . CAD (coronary artery disease)     Objective: Vital Signs: Blood pressure 116/51, pulse 56, temperature 98 F (36.7 C), temperature source Oral, resp. rate 17, weight 173 lb 11.6 oz (78.8 kg), SpO2 100 %. No results found.  Recent Labs  02/14/15 2216  WBC 6.3  HGB 8.6*  HCT 25.8*  PLT 132*    Recent Labs  02/14/15 2217  NA 133*  K 4.7  CL 95*  GLUCOSE 190*  BUN 46*  CREATININE 9.09*  CALCIUM 9.2   CBG (last 3)   Recent Labs  02/15/15 1626 02/15/15 2049 02/16/15 0637  GLUCAP 212* 149* 142*    Wt Readings from Last 3 Encounters:  02/16/15 173 lb 11.6 oz (78.8 kg)  02/08/15 181 lb 10.5 oz (82.4 kg)  03/23/14 195 lb (88.451 kg)   Lab Results  Component Value Date   HGBA1C  6.0* 02/02/2015    Intake/Output Summary (Last 24 hours) at 02/16/15 0913 Last data filed at 02/16/15 0700  Gross per 24 hour  Intake    480 ml  Output      0 ml  Net    480 ml    Patient Vitals for the past 24 hrs:  BP Temp Temp src Pulse Resp SpO2 Weight  02/16/15 0619 (!) 116/51 mmHg 98 F (36.7 C) Oral (!) 56 17 100 % 173 lb 11.6 oz (78.8 kg)  02/15/15 1313 134/65 mmHg 98.6 F (37 C) Oral 66 18 99 % -   Lab Results  Component Value Date   INR 2.67* 02/16/2015   INR 2.32* 02/15/2015   INR 2.47* 02/14/2015   Physical Exam:  Constitutional: She is oriented to person, place, and time. She appears well-developed and well-nourished.  HENT:  Head: Normocephalic and atraumatic.  Mouth/Throat: Oropharynx is clear and moist.  Eyes: Conjunctivae are normal. Pupils are equal, round, and reactive to light.  Neck: Normal range of motion. Neck supple.  Cardiovascular: Normal rate and regular rhythm.  No murmur heard. Respiratory: Effort normal and breath sounds normal. No respiratory distress. She has no wheezes.  GI: Soft. She exhibits no  distension. There is no tenderness.  Musculoskeletal: She exhibits no edema. She exhibits no tenderness.  Left thigh/AVG---trace ankle edema  Neurological: She is alert and oriented to person, place, and time.   Skin: Skin is warm and dry. No rash noted. No erythema.  Psychiatric: She has a normal mood and affect. Her behavior is normal. Thought content normal.     Medical Problem List and Plan: 1. 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 LLE DVT Prophylaxis/Anticoagulation: Pharmaceutical: Coumadin therapeutic 3. Pain Management: On Neurontin at nights for neuropathy? Continue tylenol prn no c/os of foot pain  4. Fluids/Electrolytes/Nutrition: Continue 1200 cc FR daily.  5. CAD: Treat medically and monitor for symptoms/tolerance with increase in  activity. 6. HTN: monitor BP bid on lisinopril and coreg. Adjusting per Nephro 7. ESRD: Appreciate Nephro notes. Continue HDT,Th, Sa in afternoons to help with tolerance of therapy during the day.  -AVG site without further bleeding, difficulty removing adequate volume due to low BPs   8. DM type 2: Used novolog bid prn at home. May benefit from scheduled insulin dosing. Will follow for pattern and Continue to monitor BS ac/hs and use SSI for elevated BS.  -will introduce HS lantus, 12/2 am CBG 127, will increase 9. Anemia of chronic disease: On Aranesp. Monitor and transfuse prn symptoms or Hgb less than 7.0--hgb stable at  8.4   10. PVD: Off plavix ,multiple stents left iliac vein. --now on warfarin  LOS (Days) 8 A FACE TO FACE EVALUATION WAS PERFORMED  Nyoka Cowden 02/16/2015 9:11 AM

## 2015-02-16 NOTE — Progress Notes (Signed)
Subjective:  No cos /eating lunch  Objective Vital signs in last 24 hours: Filed Vitals:   02/15/15 0135 02/15/15 0500 02/15/15 1313 02/16/15 0619  BP: 101/82 113/57 134/65 116/51  Pulse: 59 63 66 56  Temp: 99.2 F (37.3 C) 99.2 F (37.3 C) 98.6 F (37 C) 98 F (36.7 C)  TempSrc: Oral Oral Oral Oral  Resp: 18 16 18 17   Weight:  81.965 kg (180 lb 11.2 oz)  78.8 kg (173 lb 11.6 oz)  SpO2: 100% 100% 99% 100%   Weight change: -6.5 kg (-14 lb 5.3 oz) Physical Exam: General: alert NAD Heart: RRR, no rub or mur Lungs: CTA/ nonlabored breathing Abdomen: Obese, soft nt, nd Extremities:Trace bipedal edema Dialysis Access: pos bruit L fem AVGG    OP HD= [facility] 3.5h 2/2 bath P4 75kg Heparin 4000 L thigh AVG Mircera 50 q 2 wks given 11/8  Calcitriol 1.5 ug Recent: Hgb 10.8 iPTH 690 Ca/P ok  Problem/Plan: 1. L femoral vein DVT - on coumadin per Pharmacy/. [facility] has seen / thoughts are that clot is distal to AVG. Had normal shuntogram . 2. Deconditioning /hx old cva- rehab PT in hosp. 3. Vol./HTN - wt 81.9 kg up 6 kg by wts but not on exam( bed wts inaccurate.) Stand for next wt today / bp 134/65 on Coreg 3/125mg  bid/Lisinopril stopped sec low bp  4. ESRD HD TTS on schedule  5. Anemia - Hgb 8.4>8.6  HD 11/29 given 100 Aranesp / low  iron stores 16% TFS start iron load. 6. Metabolic bone disease - Continue calcitriol/binders/sensipar 7. Nutrition - renal carb mod diet/vitamin- unintentional weight loss - Alb 3.1add breeze suplement 8. DM - per primary. 9. CAD/Elevated Troponin/Acute on Chronic Systolic Dysfunction: Cardiology evaluated. Echo done 11/19 shows Mod to sev. global reduction in LV function, grade 2 diastolic dysfunction with elevated LV filling pressure. EF 30-35%. High risk-Stress test which led to cardiac cath 02/05/15 revealing severe LV dysfunction out of proportion to CAD now on coreg. multivessel CAD with chronic lesions, nothing acute. Cardiology  without plans for any further intervention.  Ernest Haber, PA-C [place] [phone] 02/16/2015,12:14 PM  LOS: 8 days   Pt seen, examined and agree w A/P as above.  Kelly Splinter MD [facility] pager [phone]    cell [phone] 02/16/2015, 1:09 PM    Labs: Basic Metabolic Panel:  Recent Labs Lab 02/12/15 1300 02/14/15 2217  NA 128* 133*  K 5.4* 4.7  CL 93* 95*  CO2 23 29  GLUCOSE 165* 190*  BUN 63* 46*  CREATININE 11.15* 9.09*  CALCIUM 9.9 9.2  PHOS 5.1* 4.5   Liver Function Tests:  Recent Labs Lab 02/12/15 1300 02/14/15 2217  ALBUMIN 3.1* 2.8*   No results for input(s): LIPASE, AMYLASE in the last 168 hours. No results for input(s): AMMONIA in the last 168 hours. CBC:  Recent Labs Lab 02/10/15 0543 02/11/15 0720 02/12/15 0514 02/14/15 2216  WBC 4.1 4.1 5.1 6.3  HGB 8.8* 8.6* 8.4* 8.6*  HCT 26.8* 25.8* 25.8* 25.8*  MCV 99.3 99.2 98.5 99.6  PLT 133* 167 159 132*   Cardiac Enzymes: No results for input(s): CKTOTAL, CKMB, CKMBINDEX, TROPONINI in the last 168 hours. CBG:  Recent Labs Lab 02/15/15 1116 02/15/15 1626 02/15/15 2049 02/16/15 0637 02/16/15 1129  GLUCAP 99 212* 149* 142* 139*    Studies/Results: No results found. Medications:   . antiseptic oral rinse  7 mL Mouth Rinse BID  . calcitRIOL  1.5 mcg Oral Q T,Th,Sa-HD  . calcium acetate  667 mg Oral TID WC  . carvedilol  3.125 mg Oral BID WC  . cinacalcet  90 mg Oral Q breakfast  . darbepoetin (ARANESP) injection - DIALYSIS  100 mcg Intravenous Q Tue-HD  . gabapentin  300 mg Oral QHS  . insulin aspart  0-5 Units Subcutaneous QHS  . insulin aspart  0-9 Units Subcutaneous TID WC  . insulin glargine  10 Units Subcutaneous QHS  . ketoconazole  1 application Topical Daily  . latanoprost  1 drop Left Eye QHS  . multivitamin  1 tablet Oral QHS  . pantoprazole  40 mg Oral Daily  . polycarbophil  1,250 mg Oral Daily  . polyethylene glycol  17 g  Oral Daily  . pravastatin  40 mg Oral Daily  . rOPINIRole  0.5 mg Oral QHS  . saccharomyces boulardii  250 mg Oral BID  . warfarin  5 mg Oral Q48H   And  . [START ON 02/17/2015] warfarin  7.5 mg Oral Q48H  . Warfarin - Pharmacist Dosing Inpatient   Does not apply [phone]

## 2015-02-16 NOTE — Progress Notes (Signed)
ANTICOAGULATION CONSULT NOTE - Follow Up Consult  Pharmacy Consult for Coumadin Indication: DVT  No Known Allergies  Patient Measurements: Weight: 173 lb 11.6 oz (78.8 kg)  Vital Signs: Temp: 98 F (36.7 C) (12/03 0619) Temp Source: Oral (12/03 0619) BP: 116/51 mmHg (12/03 0619) Pulse Rate: 56 (12/03 0619)  Labs:  Recent Labs  02/14/15 0503 02/14/15 2216 02/14/15 2217 02/15/15 0537 02/16/15 0555  HGB  --  8.6*  --   --   --   HCT  --  25.8*  --   --   --   PLT  --  132*  --   --   --   LABPROT 26.4*  --   --  25.2* 28.0*  INR 2.47*  --   --  2.32* 2.67*  CREATININE  --   --  9.09*  --   --     Estimated Creatinine Clearance: 5.9 mL/min (by C-G formula based on Cr of 9.09).   Medications:  Scheduled:  . antiseptic oral rinse  7 mL Mouth Rinse BID  . calcitRIOL  1.5 mcg Oral Q T,Th,Sa-HD  . calcium acetate  667 mg Oral TID WC  . carvedilol  3.125 mg Oral BID WC  . cinacalcet  90 mg Oral Q breakfast  . darbepoetin (ARANESP) injection - DIALYSIS  100 mcg Intravenous Q Tue-HD  . gabapentin  300 mg Oral QHS  . insulin aspart  0-5 Units Subcutaneous QHS  . insulin aspart  0-9 Units Subcutaneous TID WC  . insulin glargine  10 Units Subcutaneous QHS  . ketoconazole  1 application Topical Daily  . latanoprost  1 drop Left Eye QHS  . multivitamin  1 tablet Oral QHS  . pantoprazole  40 mg Oral Daily  . polycarbophil  1,250 mg Oral Daily  . polyethylene glycol  17 g Oral Daily  . pravastatin  40 mg Oral Daily  . rOPINIRole  0.5 mg Oral QHS  . saccharomyces boulardii  250 mg Oral BID  . warfarin  5 mg Oral Q48H   And  . [START ON 02/17/2015] warfarin  7.5 mg Oral Q48H  . Warfarin - Pharmacist Dosing Inpatient   Does not apply q1800    Assessment: 69 yo female on Coumadin for new DVT, heparin bridge completed.  INR therapeutic for several days.  Will try to establish a maintenance regimen.  Goal of Therapy:  INR 2-3 Monitor platelets by anticoagulation protocol:  Yes   Plan:  Coumadin 5mg  alternating with 7.5mg  every other day.  5mg  dose today. INR MWF Watch for s/s of bleeding  [employer], Pharm.D., BCPS Clinical Pharmacist Pager [phone] 02/16/2015 10:54 AM

## 2015-02-17 ENCOUNTER — Inpatient Hospital Stay (HOSPITAL_COMMUNITY): Payer: Medicare Other | Admitting: *Deleted

## 2015-02-17 LAB — GLUCOSE, CAPILLARY
GLUCOSE-CAPILLARY: 117 mg/dL — AB (ref 65–99)
GLUCOSE-CAPILLARY: 121 mg/dL — AB (ref 65–99)
GLUCOSE-CAPILLARY: 173 mg/dL — AB (ref 65–99)
Glucose-Capillary: 99 mg/dL (ref 65–99)
Glucose-Capillary: 241 mg/dL — ABNORMAL HIGH (ref 65–99)

## 2015-02-17 NOTE — Progress Notes (Signed)
  [facility] Progress Note   Subjective: no c/o, 3kg off w HD yest up in [facility], prob dc this Friday  Filed Vitals:   02/16/15 2355 02/17/15 0010 02/17/15 0045 02/17/15 0552  BP: 118/60  124/56 129/50  Pulse: 61  62 58  Temp:   98.5 F (36.9 C) 97.9 F (36.6 C)  TempSrc:   Oral Oral  Resp: 18  18 17   Weight: 77.8 kg (171 lb 8.3 oz) 76.8 kg (169 lb 5 oz)  76.2 kg (167 lb 15.9 oz)  SpO2:   100% 94%    Inpatient medications: . antiseptic oral rinse  7 mL Mouth Rinse BID  . calcitRIOL  1.5 mcg Oral Q T,Th,Sa-HD  . calcium acetate  667 mg Oral TID WC  . carvedilol  3.125 mg Oral BID WC  . cinacalcet  90 mg Oral Q breakfast  . darbepoetin (ARANESP) injection - DIALYSIS  100 mcg Intravenous Q Tue-HD  . gabapentin  300 mg Oral QHS  . insulin aspart  0-5 Units Subcutaneous QHS  . insulin aspart  0-9 Units Subcutaneous TID WC  . insulin glargine  10 Units Subcutaneous QHS  . ketoconazole  1 application Topical Daily  . latanoprost  1 drop Left Eye QHS  . multivitamin  1 tablet Oral QHS  . pantoprazole  40 mg Oral Daily  . polycarbophil  1,250 mg Oral Daily  . polyethylene glycol  17 g Oral Daily  . pravastatin  40 mg Oral Daily  . rOPINIRole  0.5 mg Oral QHS  . saccharomyces boulardii  250 mg Oral BID  . warfarin  5 mg Oral Q48H   And  . warfarin  7.5 mg Oral Q48H  . Warfarin - Pharmacist Dosing Inpatient   Does not apply q1800     sodium chloride, acetaminophen, albuterol, bisacodyl, diclofenac sodium, docusate sodium, guaiFENesin, heparin, nitroGLYCERIN, ondansetron, ondansetron, traMADol  Exam: Alert, no distress No jvd Chest clear bilat RRR no MRG ABd soft obese ntnd +bs Ext 1+ pretib edema L femoral AVG +bruit Neuro R hemi (old cva), ox 3   TTS 3.5h 2/2 bath P4 75kg Heparin 4000 L thigh AVG Mircera 50 q 2 wks given 11/8  Calcitriol 1.5 ug Recent: Hgb 10.8 iPTH 690 Ca/P ok      Assessment: 1 LLE DVT on coumadin 2 Debility/ old  CVA on rehab 3 ESRD 4 Anemia darbe 100, Fe 5 MBD cont meds 6 CM EF 30% low dose coreg 7 Vol^ improved significantly, close to dry, prob needs lower edw   Plan - next HD Tues, cont to lower vol as BP tolerates   Kelly Splinter MD [facility] pager [phone]    cell [phone] 02/17/2015, 9:13 AM    Recent Labs Lab 02/12/15 1300 02/14/15 2217  NA 128* 133*  K 5.4* 4.7  CL 93* 95*  CO2 23 29  GLUCOSE 165* 190*  BUN 63* 46*  CREATININE 11.15* 9.09*  CALCIUM 9.9 9.2  PHOS 5.1* 4.5    Recent Labs Lab 02/12/15 1300 02/14/15 2217  ALBUMIN 3.1* 2.8*    Recent Labs Lab 02/11/15 0720 02/12/15 0514 02/14/15 2216  WBC 4.1 5.1 6.3  HGB 8.6* 8.4* 8.6*  HCT 25.8* 25.8* 25.8*  MCV 99.2 98.5 99.6  PLT 167 159 132*

## 2015-02-17 NOTE — Progress Notes (Signed)
Physical Therapy Session Note  Patient Details  Name: Stefanie [name] MRN: [id number] Date of Birth: 02/21/1946  Today's Date: 02/17/2015 PT Individual Time: 1102-1202 PT Individual Time Calculation (min): 60 min     Skilled Therapeutic Interventions/Progress Updates:  Patient in room , napping in a recliner at the beginning of the session , easy to arouse, agrees to therapy session, no c/o pain or discomfort.  Transfer from recliner to standing with Rolator , cues to lock the breaks in order to prevent AD from rolling away. Training in gait to therapy gym with cues to increase velocity and correct posture as well as for forward gaze. Patient needs one sitting rest break due to fatigue and SOB. Verba;l and tactile cues to assure safety when turning to sit on the Rolator seat.  In the therapy gym ,focus on activity tolerance training in standing, patient performed 3 x 2 min standing in front of II bars ,performinf alternating step ups on a 4 inch step, decreased speed of activity and x2 LOb noted, min to mod A to recover.  Increased time to rest needed.  5 min arm ergometer to increase cardiopulmonary compliance and increase ability to tolerate continues activity with reduced signs of SOB.  Patient stated that she will be using w/c when at home as she feels she might need it, but she has difficulty with propulsion in a straight manner. Training in b UE propulsion with visual assistance of line in a hall to train w/c use. Reduced strength on L accounts for R side drift.  Patient ambulated half way back to the room, but due to SOb needed seated break and use of inhaler-has one in her rolator. Transported back to room via w/c requested to stay in w/c, all needs with reach.  Nursing notified that patient has her own inhaler.  Therapy Documentation Precautions:  Precautions Precautions: Fall Precaution Comments: history of B CVAs Restrictions Weight Bearing Restrictions: No   See Function  Navigator for Current Functional Status.   Therapy/Group: Individual Therapy  Stefanie [name] 02/17/2015, 12:15 PM

## 2015-02-17 NOTE — Progress Notes (Signed)
Nursing Note: Pt returned from HD. Tolerated well. T-98.5 p-62  r-20 p- 124/56 PO2 100 % on r/a. Pt resting quietly in bed. Wt-76.8 kg.Meds given.wbb

## 2015-02-17 NOTE — Progress Notes (Signed)
Patient ID: Stefanie [name], female   DOB: 1945/04/14, 69 y.o.   MRN: [id number]  Patient ID: Stefanie [name], female   DOB: 16-Jan-1946, 69 y.o.   MRN: [id number]  02/17/15.  [place] PHYSICAL MEDICINE & REHABILITATION     PROGRESS NOTE   69 y/o admit for CIR with functional deficits secondary to Debility with weakness and balance deficits and  Prior Right HP from old CVA mainly affecting arm  Subjective/Complaints: No complaints today except poor sleep last night.  ROS: Pt denies fever, rash/itching, headache, blurred or double vision, nausea, vomiting, abdominal pain, diarrhea, chest pain, shortness of breath,   Past Medical History  Diagnosis Date  . Hypertension   . Hyperlipidemia   . Diabetes mellitus   . CVA (cerebral vascular accident) ([place]) [place]    left sided weakness  . Protein malnutrition ([place])   . Secondary hyperparathyroidism ([place])   . Anemia   . End stage renal disease ([place])     initiated HD 2006  . Hx of echocardiogram     a. Echo 03/2010: EF 55-60%, Gr 1 diast dysfn, trivial MS, mild to mod LAE, PASP 34, ;  b. Echo 11/13: mild LVH, EF 60%, Gr 1 diast dysfn, Ao sclerosis, no AS, MAC, slight MS, mean 3 mmHg, PASP 34  . Hx of cardiovascular stress test     a. MV 03/2010:  no ischemia, EF 55%  . CHF (congestive heart failure) ([place])   . Retinal detachment, bilateral   . CAD (coronary artery disease)     Objective: Vital Signs: Blood pressure 129/50, pulse 58, temperature 97.9 F (36.6 C), temperature source Oral, resp. rate 17, weight 167 lb 15.9 oz (76.2 kg), SpO2 94 %. No results found.  Recent Labs  02/14/15 2216  WBC 6.3  HGB 8.6*  HCT 25.8*  PLT 132*    Recent Labs  02/14/15 2217  NA 133*  K 4.7  CL 95*  GLUCOSE 190*  BUN 46*  CREATININE 9.09*  CALCIUM 9.2   CBG (last 3)   Recent Labs  02/16/15 1612 02/17/15 0033 02/17/15 0654  GLUCAP 129* 173* 99    Wt Readings from Last 3 Encounters:  02/17/15 167 lb 15.9 oz (76.2 kg)   02/08/15 181 lb 10.5 oz (82.4 kg)  03/23/14 195 lb (88.451 kg)   Lab Results  Component Value Date   HGBA1C 6.0* 02/02/2015    Intake/Output Summary (Last 24 hours) at 02/17/15 0845 Last data filed at 02/16/15 2355  Gross per 24 hour  Intake    240 ml  Output   3000 ml  Net  -2760 ml    Patient Vitals for the past 24 hrs:  BP Temp Temp src Pulse Resp SpO2 Weight  02/17/15 0552 (!) 129/50 mmHg 97.9 F (36.6 C) Oral (!) 58 17 94 % 167 lb 15.9 oz (76.2 kg)  02/17/15 0045 (!) 124/56 mmHg 98.5 F (36.9 C) Oral 62 18 100 % -  02/17/15 0010 - - - - - - 169 lb 5 oz (76.8 kg)  02/16/15 2355 118/60 mmHg - - 61 18 - 171 lb 8.3 oz (77.8 kg)  02/16/15 2330 (!) 100/59 mmHg - - 69 18 - -  02/16/15 2300 (!) 101/55 mmHg - - 69 18 - -  02/16/15 2230 116/60 mmHg - - 64 18 - -  02/16/15 2200 (!) 111/53 mmHg - - 66 18 - -  02/16/15 2130 (!) 109/54 mmHg - - 65 18 - -  02/16/15 2100 (!) 111/55 mmHg - - 61 18 - -  02/16/15 2020 124/68 mmHg - - 64 18 - -  02/16/15 2015 133/65 mmHg - - (!) 58 18 - -  02/16/15 2010 (!) 141/58 mmHg - - 66 18 - 177 lb 14.6 oz (80.7 kg)  02/16/15 1450 (!) 139/58 mmHg 98.3 F (36.8 C) Oral (!) 57 18 96 % -   Lab Results  Component Value Date   INR 2.67* 02/16/2015   INR 2.32* 02/15/2015   INR 2.47* 02/14/2015   Physical Exam:  Constitutional: She is oriented to person, place, and time. She appears well-developed and well-nourished.  HENT:  Head: Normocephalic and atraumatic.  Mouth/Throat: Oropharynx is clear and moist.  Eyes: Conjunctivae are normal. Pupils are equal, round, and reactive to light.  Neck: Normal range of motion. Neck supple.  Cardiovascular: Normal rate and regular rhythm.  No murmur heard. Respiratory: Effort normal and breath sounds normal. No respiratory distress. She has no wheezes.  GI: Soft. She exhibits no distension. There is no tenderness.  Musculoskeletal: She exhibits no edema. She exhibits no tenderness.  Left  thigh/AVG---trace ankle edema  Dry scaly skin  Neurological: She is alert and oriented to person, place, and time.  Psychiatric: She has a normal mood and affect. Her behavior is normal. Thought content normal.     Medical Problem List and Plan: 1. 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 LLE DVT Prophylaxis/Anticoagulation: Pharmaceutical: Coumadin therapeutic 3. Pain Management: On Neurontin at nights for neuropathy? Continue tylenol prn no c/os of foot pain  4. Fluids/Electrolytes/Nutrition: Continue 1200 cc FR daily.  5. CAD: Treat medically and monitor for symptoms/tolerance with increase in activity. 6. HTN: monitor BP bid on lisinopril and coreg. Adjusting per Nephro 7. ESRD: Appreciate Nephro notes. Continue HDT,Th, Sa in afternoons to help with tolerance of therapy during the day.  -AVG site without further bleeding, difficulty removing adequate volume due to low BPs   8. DM type 2: Used novolog bid prn at home. May benefit from scheduled insulin dosing. Will follow for pattern and Continue to monitor BS ac/hs and use SSI for elevated BS.  -will introduce HS lantus, 12/2 am CBG 127, will increase 9. Anemia of chronic disease: On Aranesp. Monitor and transfuse prn symptoms or Hgb less than 7.0--hgb stable at  8.4   10. PVD: Off plavix ,multiple stents left iliac vein. --now on warfarin  LOS (Days) 9 A FACE TO FACE EVALUATION WAS PERFORMED  Nyoka Cowden 02/17/2015 8:45 AM

## 2015-02-18 ENCOUNTER — Inpatient Hospital Stay (HOSPITAL_COMMUNITY): Payer: Medicare Other | Admitting: Physical Therapy

## 2015-02-18 ENCOUNTER — Inpatient Hospital Stay (HOSPITAL_COMMUNITY): Payer: Medicare Other | Admitting: Occupational Therapy

## 2015-02-18 LAB — PROTIME-INR
INR: 2.39 — ABNORMAL HIGH (ref 0.00–1.49)
PROTHROMBIN TIME: 25.8 s — AB (ref 11.6–15.2)

## 2015-02-18 LAB — GLUCOSE, CAPILLARY
Glucose-Capillary: 87 mg/dL (ref 65–99)
Glucose-Capillary: 75 mg/dL (ref 65–99)
Glucose-Capillary: 205 mg/dL — ABNORMAL HIGH (ref 65–99)
Glucose-Capillary: 173 mg/dL — ABNORMAL HIGH (ref 65–99)

## 2015-02-18 NOTE — Progress Notes (Signed)
[place] PHYSICAL MEDICINE & REHABILITATION     PROGRESS NOTE    Subjective/Complaints: No issues overnite Pt states no one is able to provide 24/7 sup at home  ROS: Pt denies fever, rash/itching, headache, blurred or double vision, nausea, vomiting, abdominal pain, diarrhea, chest pain, shortness of breath,  Objective: Vital Signs: Blood pressure 129/55, pulse 62, temperature 98.7 F (37.1 C), temperature source Oral, resp. rate 18, weight 77.5 kg (170 lb 13.7 oz), SpO2 100 %. No results found. No results for input(s): WBC, HGB, HCT, PLT in the last 72 hours. No results for input(s): NA, K, CL, GLUCOSE, BUN, CREATININE, CALCIUM in the last 72 hours.  Invalid input(s): CO CBG (last 3)   Recent Labs  02/17/15 1159 02/17/15 1624 02/17/15 2051  GLUCAP 121* 117* 241*    Wt Readings from Last 3 Encounters:  02/18/15 77.5 kg (170 lb 13.7 oz)  02/08/15 82.4 kg (181 lb 10.5 oz)  03/23/14 88.451 kg (195 lb)    Physical Exam:  Constitutional: She is oriented to person, place, and time. She appears well-developed and well-nourished.  HENT:  Head: Normocephalic and atraumatic.  Mouth/Throat: Oropharynx is clear and moist.  Eyes: Conjunctivae are normal. Pupils are equal, round, and reactive to light.  Neck: Normal range of motion. Neck supple.  Cardiovascular: Normal rate and regular rhythm.  No murmur heard. Respiratory: Effort normal and breath sounds normal. No respiratory distress. She has no wheezes.  GI: Soft. She exhibits no distension. There is no tenderness.  Musculoskeletal: She exhibits no edema. She exhibits no tenderness.  Left thigh/AVG---removed dressing no bleeding No pain with palpation of LLE. Has some decreased sensation RLE.  Neurological: She is alert and oriented to person, place, and time.  Speech dysarthric (edentulous) and able to follow basic commands without difficulty. Sensory deficits RLE? Does sense pain in foot/ankle area. UE:RUE 3- delt,  bi, tri, grip, 5/5 on left side, 4/5 BLE Skin: Skin is warm and dry. No rash noted. No erythema.  Psychiatric: She has a normal mood and affect. Her behavior is normal. Thought content normal.   Assessment/Plan: 1. Weakness and balance deficits secondary to multiple medical issues/debility which require 3+ hours per day of interdisciplinary therapy in a comprehensive inpatient rehab setting. Physiatrist is providing close team supervision and 24 hour management of active medical problems listed below. Physiatrist and rehab team continue to assess barriers to discharge/monitor patient progress toward functional and medical goals. .Function:  Bathing Bathing position   Position: Sitting EOB  Bathing parts Body parts bathed by patient: Right arm, Left arm, Chest, Abdomen, Front perineal area, Right upper leg, Left upper leg Body parts bathed by helper: Back, Left lower leg, Right lower leg, Buttocks  Bathing assist Assist Level: Supervision or verbal cues   Set up : To adjust water temperature, To obtain items  Upper Body Dressing/Undressing Upper body dressing   What is the patient wearing?: Bra, Pull over shirt/dress Bra - Perfomed by patient: Thread/unthread right bra strap, Thread/unthread left bra strap, Hook/unhook bra (pull down sports bra) Bra - Perfomed by helper: Hook/unhook bra (pull down sports bra) Pull over shirt/dress - Perfomed by patient: Thread/unthread right sleeve, Thread/unthread left sleeve, Put head through opening, Pull shirt over trunk          Upper body assist Assist Level: Set up   Set up : To obtain clothing/put away  Lower Body Dressing/Undressing Lower body dressing   What is the patient wearing?: Pants, Non-skid slipper socks,  Socks, Shoes Underwear - Performed by patient: Thread/unthread right underwear leg, Thread/unthread left underwear leg, Pull underwear up/down Underwear - Performed by helper: Pull underwear up/down Pants- Performed by patient:  Pull pants up/down Pants- Performed by helper: Thread/unthread left pants leg, Thread/unthread right pants leg Non-skid slipper socks- Performed by patient: Don/doff right sock, Don/doff left sock Non-skid slipper socks- Performed by helper: Don/doff right sock, Don/doff left sock Socks - Performed by patient: Don/doff right sock, Don/doff left sock   Shoes - Performed by patient: Don/doff right shoe, Don/doff left shoe Shoes - Performed by helper: Don/doff right shoe, Don/doff left shoe          Lower body assist Assist for lower body dressing: Touching or steadying assistance (Pt > 75%), More than reasonable time      Toileting Toileting   Toileting steps completed by patient: Adjust clothing after toileting Toileting steps completed by helper: Performs perineal hygiene, Adjust clothing prior to toileting Toileting Assistive Devices: Grab bar or rail  Toileting assist Assist level: Touching or steadying assistance (Pt.75%)   Transfers Chair/bed transfer   Chair/bed transfer method: Stand pivot Chair/bed transfer assist level: Touching or steadying assistance (Pt > 75%) Chair/bed transfer assistive device: Armrests, Bedrails     Locomotion Ambulation     Max distance: 25 Assist level: Touching or steadying assistance (Pt > 75%)   Wheelchair   Type: Manual Max wheelchair distance: 50 Assist Level: Touching or steadying assistance (Pt > 75%)  Cognition Comprehension Comprehension assist level: Understands complex 90% of the time/cues 10% of the time  Expression Expression assist level: Expresses complex 90% of the time/cues < 10% of the time  Social Interaction Social Interaction assist level: Interacts appropriately with others - No medications needed.  Problem Solving Problem solving assist level: Solves basic 90% of the time/requires cueing < 10% of the time  Memory Memory assist level: Recognizes or recalls 75 - 89% of the time/requires cueing 10 - 24% of the time    Medical Problem List and Plan: 1. Functional deficits secondary to Debility with weakness and balance deficits Prior Right HP from old CVA mainly affecting arm, flexion contracture vs severe spasticity R thumb /FPL2. LLE DVT Prophylaxis/Anticoagulation: Pharmaceutical: Coumadin therapeutic, d/c SCD Stop plavix since coumadin therapeutic, INR  3. Pain Management: On Neurontin at nights for neuropathy? Continue tylenol prn no c/os of foot pain 4. Mood: LCSW to follow up for evaluation and support.  5. Neuropsych: This patient is capable of making decisions on her own behalf. 6. Skin/Wound Care: Routine pressure relief measures 7. Fluids/Electrolytes/Nutrition: Continue 1200 cc FR daily.  8. CAD: Treat medically and monitor for symptoms/tolerance with increase in activity. 9. HTN: monitor BP bid on lisinopril and coreg. Adjusting per Nephro 10. ESRD: Appreciate Nephro notes. Continue HDT,Th, Sa in afternoons to help with tolerance of therapy during the day.  -AVG site without further bleeding, difficulty removing adequate volume due to low BPs   11. DM type 2: Used novolog bid prn at home. May benefit from scheduled insulin dosing. Will follow for pattern and Continue to monitor BS ac/hs and use SSI for elevated BS.  -HS lantus, 12/5 am CBG 99,  12. Anemia of chronic disease: On Aranesp. Monitor and transfuse prn symptoms or Hgb less than 7.0--hgb stable at  8.6 13. Thrombocytopenia: Monitoring closely with heparin on board.132K on 12/1 14. PVD: Off plavix ,multiple stents left iliac vein. --now on warfarin LOS (Days) 10 A FACE TO FACE EVALUATION WAS PERFORMED  Alysia Penna  E 02/18/2015 6:51 AM

## 2015-02-18 NOTE — Progress Notes (Signed)
[name] ROUNDING NOTE   Subjective:   Interval History: sitting in wheelchair with no complaints  Objective:  Vital signs in last 24 hours:  Temp:  [97.7 F (36.5 C)-99.1 F (37.3 C)] 98.7 F (37.1 C) (12/05 0531) Pulse Rate:  [62-66] 62 (12/05 0531) Resp:  [18] 18 (12/05 0531) BP: (129-140)/(55-58) 129/55 mmHg (12/05 0531) SpO2:  [100 %] 100 % (12/05 0531) Weight:  [77.5 kg (170 lb 13.7 oz)] 77.5 kg (170 lb 13.7 oz) (12/05 0531)  Weight change: -3.2 kg (-7 lb 0.9 oz) Filed Weights   02/17/15 0010 02/17/15 0552 02/18/15 0531  Weight: 76.8 kg (169 lb 5 oz) 76.2 kg (167 lb 15.9 oz) 77.5 kg (170 lb 13.7 oz)    Intake/Output: I/O last 3 completed shifts: In: 62 [P.O.:420] Out: 3000 [Other:3000]   Intake/Output this shift:  Total I/O In: 120 [P.O.:120] Out: -   CVS- RRR RS- CTA ABD- BS present soft non-distended  Thigh AVG EXT-  Trace edema    Basic Metabolic Panel:  Recent Labs Lab 02/12/15 1300 02/14/15 2217  NA 128* 133*  K 5.4* 4.7  CL 93* 95*  CO2 23 29  GLUCOSE 165* 190*  BUN 63* 46*  CREATININE 11.15* 9.09*  CALCIUM 9.9 9.2  PHOS 5.1* 4.5    Liver Function Tests:  Recent Labs Lab 02/12/15 1300 02/14/15 2217  ALBUMIN 3.1* 2.8*   No results for input(s): LIPASE, AMYLASE in the last 168 hours. No results for input(s): AMMONIA in the last 168 hours.  CBC:  Recent Labs Lab 02/12/15 0514 02/14/15 2216  WBC 5.1 6.3  HGB 8.4* 8.6*  HCT 25.8* 25.8*  MCV 98.5 99.6  PLT 159 132*    Cardiac Enzymes: No results for input(s): CKTOTAL, CKMB, CKMBINDEX, TROPONINI in the last 168 hours.  BNP: Invalid input(s): POCBNP  CBG:  Recent Labs Lab 02/17/15 1159 02/17/15 1624 02/17/15 2051 02/18/15 0640 02/18/15 1124  GLUCAP 121* 117* 241* 87 1    Microbiology: Results for orders placed or performed during the hospital encounter of 11/13/10  Surgical pcr screen     Status: None   Collection Time: 11/13/10  8:44 AM   Result Value Ref Range Status   MRSA, PCR NEGATIVE NEGATIVE Final   Staphylococcus aureus NEGATIVE NEGATIVE Final    Comment:        The Xpert SA Assay (FDA approved for NASAL specimens only), is one component of a comprehensive surveillance program.  It is not intended to diagnose infection nor to guide or monitor treatment.  Culture, blood (routine x 2)     Status: None   Collection Time: 11/14/10  2:40 AM  Result Value Ref Range Status   Specimen Description BLOOD LEFT HAND  Final   Special Requests BOTTLES DRAWN AEROBIC ONLY 5CC  Final   Culture  Setup Time [id number]  Final   Culture NO GROWTH 5 DAYS  Final   Report Status 11/20/2010 FINAL  Final  Culture, blood (routine x 2)     Status: None   Collection Time: 11/14/10  2:50 AM  Result Value Ref Range Status   Specimen Description BLOOD LEFT ARM  Final   Special Requests BOTTLES DRAWN AEROBIC ONLY 5CC  Final   Culture  Setup Time [id number]  Final   Culture NO GROWTH 5 DAYS  Final   Report Status 11/20/2010 FINAL  Final    Coagulation Studies:  Recent Labs  02/16/15 0555 02/18/15 0617  LABPROT 28.0* 25.8*  INR 2.67*  2.39*    Urinalysis: No results for input(s): COLORURINE, LABSPEC, PHURINE, GLUCOSEU, HGBUR, BILIRUBINUR, KETONESUR, PROTEINUR, UROBILINOGEN, NITRITE, LEUKOCYTESUR in the last 72 hours.  Invalid input(s): APPERANCEUR    Imaging: No results found.   Medications:     . antiseptic oral rinse  7 mL Mouth Rinse BID  . calcitRIOL  1.5 mcg Oral Q T,Th,Sa-HD  . calcium acetate  667 mg Oral TID WC  . carvedilol  3.125 mg Oral BID WC  . cinacalcet  90 mg Oral Q breakfast  . darbepoetin (ARANESP) injection - DIALYSIS  100 mcg Intravenous Q Tue-HD  . gabapentin  300 mg Oral QHS  . insulin aspart  0-5 Units Subcutaneous QHS  . insulin aspart  0-9 Units Subcutaneous TID WC  . insulin glargine  10 Units Subcutaneous QHS  . ketoconazole  1 application Topical Daily  . latanoprost  1 drop Left Eye  QHS  . multivitamin  1 tablet Oral QHS  . pantoprazole  40 mg Oral Daily  . polycarbophil  1,250 mg Oral Daily  . polyethylene glycol  17 g Oral Daily  . pravastatin  40 mg Oral Daily  . rOPINIRole  0.5 mg Oral QHS  . saccharomyces boulardii  250 mg Oral BID  . warfarin  5 mg Oral Q48H   And  . warfarin  7.5 mg Oral Q48H  . Warfarin - Pharmacist Dosing Inpatient   Does not apply q1800   sodium chloride, acetaminophen, albuterol, bisacodyl, diclofenac sodium, docusate sodium, guaiFENesin, heparin, nitroGLYCERIN, ondansetron, ondansetron, traMADol  Assessment/ Plan:  [name] 3.5h 2/2 bath P4 75kg Heparin 4000 L thigh AVG Mircera 50 q 2 wks given 11/8  Calcitriol 1.5 ug   ESRD dialysis in [name]  LLE DVT  Coumadin  INR 2.39  Anemia Darbepoietin  159mcg weekly   Hb 8.6   Bones  Cinacalcet, 90 mg daily  Calcium acetate 667 mg with meals  and calcitriol 1.74mcg [name]   Last caslium 9.2  Phos  4.5  12/1  Diabetes per primary team    LOS: 10 [name] W @TODAY @11 :55 AM

## 2015-02-18 NOTE — Progress Notes (Signed)
Physical Therapy Weekly Progress Note  Patient Details  Name: Stefanie [name] MRN: [id number] Date of Birth: 1945/12/08  Beginning of progress report period: February 09, 2015 End of progress report period: February 18, 2015  Today's Date: 02/18/2015 PT Individual Time: 0900-1000 PT Individual Time Calculation (min): 60 min   Patient has met 4 of 4 short term goals.  Pt requires CGA overall for transfers and gait due to impaired balance, postural control, LE strength deficits. Pt is demonstrating consistent progress and requires less cues and assistance for upright postural control, safety during mobility tasks.   Patient continues to demonstrate the following deficits: impaired activity tolerance, balance, postural control, ability to compensate for deficits, awareness and knowledge of precautions.and therefore will continue to benefit from skilled PT intervention to enhance overall performance with bed mobility, transfers, gait, home access and adl's.  Patient progressing toward long term goals..  Continue plan of care.  PT Short Term Goals Week 1:  PT Short Term Goal 1 (Week 1): Pt will ambulate with one person assist x 20' PT Short Term Goal 1 - Progress (Week 1): Met PT Short Term Goal 2 (Week 1): Pt will demonstrate all bed mobility with supervision PT Short Term Goal 2 - Progress (Week 1): Met PT Short Term Goal 3 (Week 1): Pt will demonstrate car transfers with CGA.  PT Short Term Goal 3 - Progress (Week 1): Met PT Short Term Goal 4 (Week 1): Pt will self propel manual w/c x 40' with min A.  PT Short Term Goal 4 - Progress (Week 1): Met Week 2:  PT Short Term Goal 1 (Week 2): = LTG due to estimated length of stay  Skilled Therapeutic Interventions/Progress Updates:    Pt received seated in w/c, no c/o pain and agreeable to treatment. Upper and lower body dressing performed with S overall, steady A for standing balance. Seated at sink, pt performed self-care while seated in w/c  with set-up A. Pt retrieved clothes from drawer and placed into bag to transport to washing machine. Gait x10' into and out of laundry room with RW and CGA. Standing at washing machine pt loaded clothes, retrieved detergent pod, and started machine with standbyA and verbal cues for sequencing and use of machine. W/c propulsion x50' with BUEs, minA for navigation and slow speed. Gait with RW x170' and CGA, occasional verbal cues for upright posture and forward gaze. Nustep x10' with BUE/BLE for strengthening and aerobic endurance. Remained seated in w/c with all needs in reach at completion of session.   Therapy Documentation Precautions:  Precautions Precautions: Fall Precaution Comments: history of B CVAs Restrictions Weight Bearing Restrictions: No Pain: Pain Assessment Pain Assessment: No/denies pain Pain Score: 0-No pain   See Function Navigator for Current Functional Status.  Therapy/Group: Individual Therapy  Luberta Mutter 02/18/2015, 11:35 AM

## 2015-02-18 NOTE — Plan of Care (Signed)
Problem: RH PAIN MANAGEMENT Goal: RH STG PAIN MANAGED AT OR BELOW PT'S PAIN GOAL Pain less than or equal to 2.  Outcome: Progressing Patient reports pain to R knee, using Volteran gel PRN.

## 2015-02-18 NOTE — Progress Notes (Signed)
ANTICOAGULATION CONSULT NOTE - Follow Up Consult  Pharmacy Consult for Coumadin Indication: DVT  No Known Allergies  Patient Measurements: Weight: 170 lb 13.7 oz (77.5 kg) Heparin Dosing Weight:   Vital Signs: Temp: 98.7 F (37.1 C) (12/05 0531) Temp Source: Oral (12/05 0531) BP: 129/55 mmHg (12/05 0531) Pulse Rate: 62 (12/05 0531)  Labs:  Recent Labs  02/16/15 0555 02/18/15 0617  LABPROT 28.0* 25.8*  INR 2.67* 2.39*    Estimated Creatinine Clearance: 5.9 mL/min (by C-G formula based on Cr of 9.09).   Medications:  Scheduled:  . antiseptic oral rinse  7 mL Mouth Rinse BID  . calcitRIOL  1.5 mcg Oral Q T,Th,Sa-HD  . calcium acetate  667 mg Oral TID WC  . carvedilol  3.125 mg Oral BID WC  . cinacalcet  90 mg Oral Q breakfast  . darbepoetin (ARANESP) injection - DIALYSIS  100 mcg Intravenous Q Tue-HD  . gabapentin  300 mg Oral QHS  . insulin aspart  0-5 Units Subcutaneous QHS  . insulin aspart  0-9 Units Subcutaneous TID WC  . insulin glargine  10 Units Subcutaneous QHS  . ketoconazole  1 application Topical Daily  . latanoprost  1 drop Left Eye QHS  . multivitamin  1 tablet Oral QHS  . pantoprazole  40 mg Oral Daily  . polycarbophil  1,250 mg Oral Daily  . polyethylene glycol  17 g Oral Daily  . pravastatin  40 mg Oral Daily  . rOPINIRole  0.5 mg Oral QHS  . saccharomyces boulardii  250 mg Oral BID  . warfarin  5 mg Oral Q48H   And  . warfarin  7.5 mg Oral Q48H  . Warfarin - Pharmacist Dosing Inpatient   Does not apply q1800    Assessment: 69yo female with new DVT, heparin bridge completed.  INR therapeutic on Coumadin 5mg  alternating with 7.5mg  qoday.  No bleeding noted.  Goal of Therapy:  INR 2-3 Monitor platelets by anticoagulation protocol: Yes   Plan:  Continue Coumadin 5mg /7.5mg  alternating qoday INRs qMonWedFri to limit sticks Watch for s/s of bleeding.  Gracy Bruins, PharmD Clinical Pharmacist [facility]

## 2015-02-18 NOTE — Progress Notes (Signed)
Occupational Therapy Session Note  Patient Details  Name: [name] MRN: [id number] Date of Birth: 1945/10/20  Today's Date: 02/18/2015 OT Individual Time: 1005-1101 and 1335-1435 OT Individual Time Calculation (min): 56 min and 60 min   Short Term Goals: Week 2:  OT Short Term Goal 1 (Week 2): Pt will perform LB bathing with supervision shower level sit to stand. OT Short Term Goal 2 (Week 2): Pt will perform LB dressing with supervision sit to stand.  OT Short Term Goal 3 (Week 2): Pt will perform toilet transfers and toileting with supervision sit to stand.  OT Short Term Goal 4 (Week 2): Pt will perform simple snack prep with RW and close supervision.   Skilled Therapeutic Interventions/Progress Updates:    1) Treatment session with focus on sit> stand and standing tolerance.  Pt declined bathing and dressing, reporting getting dressed with PT prior to OT session.  Engaged in laundry task with pt retrieve clothing from washing machine in standing and placing them in front loading dryer, therapist providing contact guard during reaching task.  Engaged in blocked practice of sit > stand from therapy mat progressively lowering height of mat to increase challenge.  Completed table top task in standing with close supervision while reaching across midline.  Attempted sit > stand without use of UE, with pt requiring elevated surface to be successful without UE support.   2) Treatment session with focus on functional mobility, trunk control, and sit > stand.  Pt received from PT and ambulated to therapy gym with Rollator and contact guard.  Engaged in multiple sit > stand progressing to lower mat height with pt able to complete each with supervision this session.  Engaged in table top activity with focus on weight shifting to reach for items and upright standing tolerance.  Progressed to standing on compliant surface to further address standing balance. Attempted sit > stand without use of UE,  with pt holding small ball with both hands.  Pt able to achieve one attempt from elevated surface, however appears that pt compensates for Rt knee pain and decreased control with UE support.  Pt returned to room with Rollator requiring one seated rest break midway.  Therapy Documentation Precautions:  Precautions Precautions: Fall Precaution Comments: history of B CVAs Restrictions Weight Bearing Restrictions: No Pain: Pain Assessment Pain Assessment: No/denies pain Pain Score: 0-No pain  See Function Navigator for Current Functional Status.   Therapy/Group: Individual Therapy  Simonne Come 02/18/2015, 10:54 AM

## 2015-02-19 ENCOUNTER — Inpatient Hospital Stay (HOSPITAL_COMMUNITY): Payer: Medicare Other | Admitting: *Deleted

## 2015-02-19 ENCOUNTER — Inpatient Hospital Stay (HOSPITAL_COMMUNITY): Payer: Medicare Other | Admitting: Physical Therapy

## 2015-02-19 ENCOUNTER — Inpatient Hospital Stay (HOSPITAL_COMMUNITY): Payer: Medicare Other | Admitting: Occupational Therapy

## 2015-02-19 LAB — RENAL FUNCTION PANEL
Albumin: 3.4 g/dL — ABNORMAL LOW (ref 3.5–5.0)
Anion gap: 12 (ref 5–15)
BUN: 40 mg/dL — AB (ref 6–20)
CHLORIDE: 93 mmol/L — AB (ref 101–111)
CO2: 27 mmol/L (ref 22–32)
CREATININE: 8.91 mg/dL — AB (ref 0.44–1.00)
Calcium: 9.5 mg/dL (ref 8.9–10.3)
GFR calc Af Amer: 5 mL/min — ABNORMAL LOW (ref >60)
GFR, EST NON AFRICAN AMERICAN: 4 mL/min — AB (ref >60)
Glucose, Bld: 185 mg/dL — ABNORMAL HIGH (ref 65–99)
Phosphorus: 4.5 mg/dL (ref 2.5–4.6)
Potassium: 4.7 mmol/L (ref 3.5–5.1)
SODIUM: 132 mmol/L — AB (ref 135–145)

## 2015-02-19 LAB — CBC
HCT: 30 % — ABNORMAL LOW (ref 36.0–46.0)
HEMOGLOBIN: 9.5 g/dL — AB (ref 12.0–15.0)
MCH: 31.8 pg (ref 26.0–34.0)
MCHC: 31.7 g/dL (ref 30.0–36.0)
MCV: 100.3 fL — AB (ref 78.0–100.0)
Platelets: 180 10*3/uL (ref 150–400)
RBC: 2.99 MIL/uL — AB (ref 3.87–5.11)
RDW: 17.5 % — ABNORMAL HIGH (ref 11.5–15.5)
WBC: 4.2 10*3/uL (ref 4.0–10.5)

## 2015-02-19 LAB — GLUCOSE, CAPILLARY
GLUCOSE-CAPILLARY: 86 mg/dL (ref 65–99)
GLUCOSE-CAPILLARY: 81 mg/dL (ref 65–99)
GLUCOSE-CAPILLARY: 149 mg/dL — AB (ref 65–99)
GLUCOSE-CAPILLARY: 177 mg/dL — AB (ref 65–99)
Glucose-Capillary: 73 mg/dL (ref 65–99)

## 2015-02-19 MED ORDER — MUSCLE RUB 10-15 % EX CREA
TOPICAL_CREAM | Freq: Two times a day (BID) | CUTANEOUS | Status: DC | PRN
  Filled 2015-02-19: qty 85

## 2015-02-19 MED ORDER — LIDOCAINE-PRILOCAINE 2.5-2.5 % EX CREA: 1.0000 "application " | TOPICAL_CREAM | CUTANEOUS | Status: DC | PRN

## 2015-02-19 MED ORDER — PENTAFLUOROPROP-TETRAFLUOROETH EX AERO: 1.0000 "application " | INHALATION_SPRAY | CUTANEOUS | Status: DC | PRN

## 2015-02-19 MED ORDER — HEPARIN SODIUM (PORCINE) 1000 UNIT/ML DIALYSIS: 1000.0000 [IU] | INTRAMUSCULAR | Status: DC | PRN

## 2015-02-19 MED ORDER — SODIUM CHLORIDE 0.9 % IV SOLN: 100.0000 mL | INTRAVENOUS | Status: DC | PRN

## 2015-02-19 MED ORDER — DARBEPOETIN ALFA 100 MCG/0.5ML IJ SOSY
PREFILLED_SYRINGE | INTRAMUSCULAR | Status: AC
  Filled 2015-02-19: qty 0.5

## 2015-02-19 MED ORDER — LIDOCAINE HCL (PF) 1 % IJ SOLN: 5.0000 mL | INTRAMUSCULAR | Status: DC | PRN

## 2015-02-19 MED ORDER — ALTEPLASE 2 MG IJ SOLR: 2.0000 mg | Freq: Once | INTRAMUSCULAR | Status: DC | PRN

## 2015-02-19 NOTE — Progress Notes (Signed)
Recreational Therapy Session Note  Patient Details  Name: [facility]ie [name] MRN: [id number] Date of Birth: [birth year] Today's Date: 02/19/2015  Pain: no c/o Skilled Therapeutic Interventions/Progress Updates: Session focused on activity tolerance, dynamic sitting balance, BUE use during tabletop card game with supervision.  Pt used BUE's to manipulate cards during game play.  Therapy/Group: Individual Therapy  Exodus Kutzer 02/19/2015, 12:08 PM

## 2015-02-19 NOTE — Progress Notes (Signed)
Occupational Therapy Session Note  Patient Details  Name: Stefanie [name] MRN: [id number] Date of Birth: 1945-06-27  Today's Date: 02/19/2015 OT Individual Time:0730-0858 1300-1400 OT Individual Time Calculation (min): 88 min and 60 min    Short Term Goals: Week 2:  OT Short Term Goal 1 (Week 2): Pt will perform LB bathing with supervision shower level sit to stand. OT Short Term Goal 2 (Week 2): Pt will perform LB dressing with supervision sit to stand.  OT Short Term Goal 3 (Week 2): Pt will perform toilet transfers and toileting with supervision sit to stand.  OT Short Term Goal 4 (Week 2): Pt will perform simple snack prep with RW and close supervision.   Skilled Therapeutic Interventions/Progress Updates:    1) ADL retraining with focus on functional mobility, transfers, and increased safety with self-care tasks.  Pt ambulated to room shower with Rollator and close supervision.  Bathing completed with lateral leans on shower chair with only setup assist for items prior to initiating shower.  Pt ambulated from shower seat to toilet with Rollator with close supervision.  Pt demonstrating increased upright posture during standing and with ambulation this session.  Dressing completed at sit > stand level with pt demonstrating improved sequencing and safety with LB dressing.  Pt continues to require increased time with dressing but demonstrating improved safety.  Pt able to open packets and containers when setting up breakfast tray this session.  2) Treatment session with focus on functional mobility and safety in ADL kitchen.  Pt reports having a hired Engineer, production that assisted with all housekeeping tasks, but pt still prepares simple meals (mostly microwave and occasional oven).  Pt ambulated through ADL kitchen with Rollator with supervision, obtaining items from cabinets, refrigerator, and oven to simulate meal prep.  Educated on improved positioning to increase safety when reaching into oven.   Utilized Rollator to [employer] and educated on use of counter tops as well to increase safety.  Engaged in table top task in standing with focus on sit > stand and standing tolerance, pt reports increase in Rt knee pain with prolonged standing.  Ambulated approx 100 feet back towards room with Rollator and then therapist pushed w/c rest of distance.    Therapy Documentation Precautions:  Precautions Precautions: Fall Precaution Comments: history of B CVAs Restrictions Weight Bearing Restrictions: No General:   Vital Signs: Therapy Vitals Temp: 97.6 F (36.4 C) Temp Source: Oral Pulse Rate: 64 Resp: 17 BP: (!) 126/52 mmHg Patient Position (if appropriate): Lying Oxygen Therapy SpO2: 100 % O2 Device: Not Delivered Pain:  Pt with c/o pain in Lt leg, modified treatment within pain tolerance and notified RN for pain meds  See Function Navigator for Current Functional Status.   Therapy/Group: Individual Therapy  Simonne Come 02/19/2015, 8:03 AM

## 2015-02-19 NOTE — Progress Notes (Signed)
Social Work Patient ID: Stefanie [name], female   DOB: Jul 16, 1945, 69 y.o.   MRN: [id number] Pt doing better in therapies and making progress toward going home. She is pleased with her progress and feels she is walking better. Will have team conference tomorrow and discuss discharge date.

## 2015-02-19 NOTE — Progress Notes (Signed)
Pt tolerated tx well.  Post assessment unchanged.  Report given to bedside RN, Otila Kluver  Needles pulled and pressure held ten minutes.  Goal of 2560mL and net fluid removal of 2L.

## 2015-02-19 NOTE — Progress Notes (Addendum)
[place] [facility]IT[facility]     PROGRESS NOTE    Subjective/Complaints: Left leg pain.  Pt denies trauma, no burning or numbness, no back pain, no knee pain  ROS: Pt denies , nausea, vomiting, abdominal pain, diarrhea, chest pain, shortness of breath,  Objective: Vital Signs: Blood pressure 126/52, pulse 64, temperature 97.6 F (36.4 C), temperature source Oral, resp. rate 17, weight 77.6 kg (171 lb 1.2 oz), SpO2 100 %. No results found. No results for input(s): WBC, HGB, HCT, PLT in the last 72 hours. No results for input(s): NA, K, CL, GLUCOSE, BUN, CREATININE, CALCIUM in the last 72 hours.  Invalid input(s): CO CBG (last 3)   Recent Labs  02/18/15 1124 02/18/15 1621 02/18/15 2051  GLUCAP 75 205* 173*    Wt Readings from Last 3 Encounters:  02/19/15 77.6 kg (171 lb 1.2 oz)  02/08/15 82.4 kg (181 lb 10.5 oz)  03/23/14 88.451 kg (195 lb)    Physical Exam:  Constitutional: She is oriented to person, place, and time. She appears well-developed and well-nourished.  HENT:  Head: Normocephalic and atraumatic.  Mouth/Throat: Oropharynx is clear and moist.  Eyes: Conjunctivae are normal. Pupils are equal, round, and reactive to light.  Neck: Normal range of motion. Neck supple.  Cardiovascular: Normal rate and regular rhythm.  No murmur heard. Respiratory: Effort normal and breath sounds normal. No respiratory distress. She has no wheezes.  GI: Soft. She exhibits no distension. There is no tenderness.  Musculoskeletal: She exhibits no edema. She exhibits no tenderness.  Left thigh/AVG-- No pain with palpation of LLE. Has some decreased sensation RLE.  Neurological: She is alert and oriented to person, place, and time.  Speech dysarthric (edentulous) and able to follow basic commands without difficulty. Sensory deficits RLE? Does sense pain in foot/ankle area. UE:RUE 3- delt, bi, tri, grip, 5/5 on left side, 4/5 BLE Skin: Skin is warm and dry. No  rash noted. No erythema.  Psychiatric: She has a normal mood and affect. Her behavior is normal. Thought content normal.   Assessment/Plan: 1. Weakness and balance deficits secondary to multiple medical issues/debility which require 3+ hours per day of interdisciplinary therapy in a comprehensive inpatient rehab setting. Physiatrist is providing close team supervision and 24 hour management of active medical problems listed below. Physiatrist and rehab team continue to assess barriers to discharge/monitor patient progress toward functional and medical goals. .Function:  Bathing Bathing position   Position: Sitting EOB  Bathing parts Body parts bathed by patient: Right arm, Left arm, Chest, Abdomen, Front perineal area, Right upper leg, Left upper leg Body parts bathed by helper: Back, Left lower leg, Right lower leg, Buttocks  Bathing assist Assist Level: Supervision or verbal cues   Set up : To adjust water temperature, To obtain items  Upper Body Dressing/Undressing Upper body dressing   What is the patient wearing?: Hospital gown Bra - Perfomed by patient: Thread/unthread right bra strap, Thread/unthread left bra strap, Hook/unhook bra (pull down sports bra) Bra - Perfomed by helper: Hook/unhook bra (pull down sports bra) Pull over shirt/dress - Perfomed by patient: Thread/unthread right sleeve, Thread/unthread left sleeve, Put head through opening, Pull shirt over trunk          Upper body assist Assist Level: Set up   Set up : To obtain clothing/put away  Lower Body Dressing/Undressing Lower body dressing   What is the patient wearing?: [facility] - Performed by patient: Thread/unthread right underwear leg, Thread/unthread left  underwear leg, Pull underwear up/down Underwear - Performed by helper: Pull underwear up/down Pants- Performed by patient: Pull pants up/down Pants- Performed by helper: Thread/unthread left pants leg, Thread/unthread right pants  leg Non-skid slipper socks- Performed by patient: Don/doff right sock, Don/doff left sock Non-skid slipper socks- Performed by helper: Don/doff right sock, Don/doff left sock Socks - Performed by patient: Don/doff right sock, Don/doff left sock   Shoes - Performed by patient: Don/doff right shoe, Don/doff left shoe Shoes - Performed by helper: Don/doff right shoe, Don/doff left shoe          Lower body assist Assist for lower body dressing: Touching or steadying assistance (Pt > 75%), More than reasonable time      Toileting Toileting   Toileting steps completed by patient: Performs perineal hygiene, Adjust clothing prior to toileting Toileting steps completed by helper: Adjust clothing after toileting Toileting Assistive Devices: Grab bar or rail  Toileting assist Assist level: Touching or steadying assistance (Pt.75%)   Transfers Chair/bed transfer   Chair/bed transfer method: Stand pivot Chair/bed transfer assist level: Touching or steadying assistance (Pt > 75%) Chair/bed transfer assistive device: Armrests, Medical sales representative     Max distance: 110 Assist level: Touching or steadying assistance (Pt > 75%)   Wheelchair   Type: Manual Max wheelchair distance: 50 Assist Level: Touching or steadying assistance (Pt > 75%)  Cognition Comprehension Comprehension assist level: Understands complex 90% of the time/cues 10% of the time  Expression Expression assist level: Expresses complex 90% of the time/cues < 10% of the time  Social Interaction Social Interaction assist level: Interacts appropriately with others - No medications needed.  Problem Solving Problem solving assist level: Solves basic 90% of the time/requires cueing < 10% of the time  Memory Memory assist level: Recognizes or recalls 75 - 89% of the time/requires cueing 10 - 24% of the time   Medical Problem List and Plan: 1. 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 LLE DVT Prophylaxis/Anticoagulation: Pharmaceutical: Coumadin therapeutic, d/c SCD Stop plavix since coumadin therapeutic, INR  3. Pain Management: On Neurontin at nights for neuropathy? Left leg and achilles area, exam is unremarkable, only calf soreness to palpation, suspect DVT =/- strain , add sports creme    Continue tylenol prn no c/os of foot pain 4. Mood: LCSW to follow up for evaluation and support.  5. Neuropsych: This patient is capable of making decisions on her own behalf. 6. Skin/Wound Care: Routine pressure relief measures 7. Fluids/Electrolytes/Nutrition: Continue 1200 cc FR daily.  8. CAD: Treat medically and monitor for symptoms/tolerance with increase in activity. 9. HTN: monitor BP bid on lisinopril and coreg. Adjusting per Nephro 10. ESRD: Appreciate Nephro notes. Continue HD T,Th, Sa in afternoons to help with tolerance of therapy during the day.  -AVG site without further bleeding, difficulty removing adequate volume due to low BPs   11. DM type 2: Used novolog bid prn at home. May benefit from scheduled insulin dosing. Will follow for pattern and Continue to monitor BS ac/hs and use SSI for elevated BS.  -HS lantus, 12/5 am CBG 99, 12/6 pnd 12. Anemia of chronic disease: On Aranesp. Monitor and transfuse prn symptoms or Hgb less than 7.0--hgb stable at  8.6 13. Thrombocytopenia: Monitoring closely with heparin on board.132K on 12/1 14. PVD: Off plavix ,multiple stents left iliac  vein. --now on warfarin, INR therapeutic LOS (Days) 11 A FACE TO FACE EVALUATION WAS PERFORMED  Necole Minassian E 02/19/2015 6:51 AM

## 2015-02-19 NOTE — Progress Notes (Signed)
Physical Therapy Session Note  Patient Details  Name: Stefanie [name] MRN: [id number] Date of Birth: 1945/06/29  Today's Date: 02/19/2015 PT Individual Time: 1000-1100 PT Individual Time Calculation (min): 60 min   Short Term Goals: Week 2:  PT Short Term Goal 1 (Week 2): = LTG due to estimated length of stay  Skilled Therapeutic Interventions/Progress Updates:    Pt received seated in w/c with no c/o pain and agreeable to treatment. Gait with rollator x150' with close S, verbal cues for upright posture and forward gaze. Upon sitting at mat table in gym, pt reports feeling "like I'm going to pass out". NA alerted to check blood sugar, noted hypoglycemia. Pt given juice and crackers with peanut butter and given several minute rest break to eat. Sitting and standing LE strengthening exercises performed including standing hip flexion, hamstring curl, heel raises, long arc quad, hip adduction isometric. Nustep x9 min with BUE/BLE and assist device to reduce adduction. Returned to room in w/c totalA for energy conservation. Remained seated in w/c with all needs in reach at completion of session.   Therapy Documentation Precautions:  Precautions Precautions: Fall Precaution Comments: history of B CVAs Restrictions Weight Bearing Restrictions: No Pain: Pain Assessment Pain Assessment: No/denies pain Pain Score: 0-No pain   See Function Navigator for Current Functional Status.   Therapy/Group: Individual Therapy  Luberta Mutter 02/19/2015, 12:15 PM

## 2015-02-19 NOTE — Progress Notes (Signed)
[facility] ROUNDING NOTE   Subjective:   Interval History:  No complaints this morning  Objective:  Vital signs in last 24 hours:  Temp:  [97.6 F (36.4 C)-98.2 F (36.8 C)] 97.6 F (36.4 C) (12/06 0629) Pulse Rate:  [64] 64 (12/06 0629) Resp:  [17-18] 17 (12/06 0629) BP: (126-130)/(52) 126/52 mmHg (12/06 0629) SpO2:  [100 %] 100 % (12/06 0629) Weight:  [77.6 kg (171 lb 1.2 oz)] 77.6 kg (171 lb 1.2 oz) (12/06 0629)  Weight change: 0.1 kg (3.5 oz) Filed Weights   02/17/15 0552 02/18/15 0531 02/19/15 0629  Weight: 76.2 kg (167 lb 15.9 oz) 77.5 kg (170 lb 13.7 oz) 77.6 kg (171 lb 1.2 oz)    Intake/Output: I/O last 3 completed shifts: In: 100 [P.O.:540] Out: -    Intake/Output this shift:     CVS- RRR RS- CTA ABD- BS present soft non-distended Thigh AVG EXT- Trace edema    Basic Metabolic Panel:  Recent Labs Lab 02/14/15 2217  NA 133*  K 4.7  CL 95*  CO2 29  GLUCOSE 190*  BUN 46*  CREATININE 9.09*  CALCIUM 9.2  PHOS 4.5    Liver Function Tests:  Recent Labs Lab 02/14/15 2217  ALBUMIN 2.8*   No results for input(s): LIPASE, AMYLASE in the last 168 hours. No results for input(s): AMMONIA in the last 168 hours.  CBC:  Recent Labs Lab 02/14/15 2216  WBC 6.3  HGB 8.6*  HCT 25.8*  MCV 99.6  PLT 132*    Cardiac Enzymes: No results for input(s): CKTOTAL, CKMB, CKMBINDEX, TROPONINI in the last 168 hours.  BNP: Invalid input(s): POCBNP  CBG:  Recent Labs Lab 02/18/15 1621 02/18/15 2051 02/19/15 0732 02/19/15 1017 02/19/15 1119  GLUCAP 205* 173* 73 72 81    Microbiology: Results for orders placed or performed during the hospital encounter of 11/13/10  Surgical pcr screen     Status: None   Collection Time: 11/13/10  8:44 AM  Result Value Ref Range Status   MRSA, PCR NEGATIVE NEGATIVE Final   Staphylococcus aureus NEGATIVE NEGATIVE Final    Comment:        The Xpert SA Assay (FDA approved for NASAL  specimens only), is one component of a comprehensive surveillance program.  It is not intended to diagnose infection nor to guide or monitor treatment.  Culture, blood (routine x 2)     Status: None   Collection Time: 11/14/10  2:40 AM  Result Value Ref Range Status   Specimen Description BLOOD LEFT HAND  Final   Special Requests BOTTLES DRAWN AEROBIC ONLY 5CC  Final   Culture  Setup Time [id number]  Final   Culture NO GROWTH 5 DAYS  Final   Report Status 11/20/2010 FINAL  Final  Culture, blood (routine x 2)     Status: None   Collection Time: 11/14/10  2:50 AM  Result Value Ref Range Status   Specimen Description BLOOD LEFT ARM  Final   Special Requests BOTTLES DRAWN AEROBIC ONLY 5CC  Final   Culture  Setup Time [id number]  Final   Culture NO GROWTH 5 DAYS  Final   Report Status 11/20/2010 FINAL  Final    Coagulation Studies:  Recent Labs  02/18/15 0617  LABPROT 25.8*  INR 2.39*    Urinalysis: No results for input(s): COLORURINE, LABSPEC, PHURINE, GLUCOSEU, HGBUR, BILIRUBINUR, KETONESUR, PROTEINUR, UROBILINOGEN, NITRITE, LEUKOCYTESUR in the last 72 hours.  Invalid input(s): APPERANCEUR    Imaging: No results found.  Medications:     . antiseptic oral rinse  7 mL Mouth Rinse BID  . calcitRIOL  1.5 mcg Oral Q T,Th,Sa-HD  . calcium acetate  667 mg Oral TID WC  . carvedilol  3.125 mg Oral BID WC  . cinacalcet  90 mg Oral Q breakfast  . darbepoetin (ARANESP) injection - DIALYSIS  100 mcg Intravenous Q Tue-HD  . gabapentin  300 mg Oral QHS  . insulin aspart  0-5 Units Subcutaneous QHS  . insulin aspart  0-9 Units Subcutaneous TID WC  . insulin glargine  10 Units Subcutaneous QHS  . ketoconazole  1 application Topical Daily  . latanoprost  1 drop Left Eye QHS  . multivitamin  1 tablet Oral QHS  . pantoprazole  40 mg Oral Daily  . polycarbophil  1,250 mg Oral Daily  . polyethylene glycol  17 g Oral Daily  . pravastatin  40 mg Oral Daily  . rOPINIRole  0.5  mg Oral QHS  . saccharomyces boulardii  250 mg Oral BID  . warfarin  5 mg Oral Q48H   And  . warfarin  7.5 mg Oral Q48H  . Warfarin - Pharmacist Dosing Inpatient   Does not apply q1800   sodium chloride, acetaminophen, albuterol, bisacodyl, diclofenac sodium, docusate sodium, guaiFENesin, heparin, MUSCLE RUB, nitroGLYCERIN, ondansetron, ondansetron, traMADol  Assessment/ Plan:  [facility] TTS 3.5h 2/2 bath P4 75kg Heparin 4000 L thigh AVG Mircera 50 q 2 wks given 11/8  Calcitriol 1.5 ug   ESRD dialysis in TTS  LLE DVT Coumadin INR 2.39  Anemia Darbepoietin 165mcg weekly Hb 8.6   Bones Cinacalcet, 90 mg daily Calcium acetate 667 mg with meals and calcitriol 1.72mcg TTS Last caslium 9.2 Phos 4.5 12/1  Diabetes per primary team  LOS: 11 Shawnita Krizek W @TODAY @1 :06 PM

## 2015-02-19 NOTE — Progress Notes (Signed)
Pt arrived to unit per bed at 1530.  Report received from bedside RN, Ene.  A & O X 4  Lungs diminished to auscultation. BLE +1 pitting edema. Regular R&R  L thigh AVG accessed with 15 gauge needles.  Pulsation of blood noted.  Labs drawn and both access points flushed.  Tx initiated at 1545 with goal of 2526mL and net fluid removal 2L.  Will continue to monitor.

## 2015-02-20 ENCOUNTER — Inpatient Hospital Stay (HOSPITAL_COMMUNITY): Payer: Medicare Other | Admitting: Physical Therapy

## 2015-02-20 ENCOUNTER — Inpatient Hospital Stay (HOSPITAL_COMMUNITY): Payer: Medicare Other | Admitting: *Deleted

## 2015-02-20 ENCOUNTER — Inpatient Hospital Stay (HOSPITAL_COMMUNITY): Payer: Medicare Other | Admitting: Occupational Therapy

## 2015-02-20 LAB — GLUCOSE, CAPILLARY
GLUCOSE-CAPILLARY: 103 mg/dL — AB (ref 65–99)
GLUCOSE-CAPILLARY: 228 mg/dL — AB (ref 65–99)
Glucose-Capillary: 103 mg/dL — ABNORMAL HIGH (ref 65–99)
Glucose-Capillary: 133 mg/dL — ABNORMAL HIGH (ref 65–99)

## 2015-02-20 LAB — PROTIME-INR
INR: 2.35 — AB (ref 0.00–1.49)
PROTHROMBIN TIME: 25.5 s — AB (ref 11.6–15.2)

## 2015-02-20 NOTE — Progress Notes (Signed)
ANTICOAGULATION CONSULT NOTE - Follow Up Consult  Pharmacy Consult for coumadin Indication: DVT  No Known Allergies  Patient Measurements Weight: 173 lb 3.2 oz (78.563 kg) Heparin Dosing Weight:   Vital Signs: Temp: 97 F (36.1 C) (12/07 0500) Temp Source: Oral (12/07 0500) BP: 128/47 mmHg (12/07 0500) Pulse Rate: 60 (12/07 0500)  Labs:  Recent Labs  02/18/15 0617 02/19/15 1540 02/20/15 0525  HGB  --  9.5*  --   HCT  --  30.0*  --   PLT  --  180  --   LABPROT 25.8*  --  25.5*  INR 2.39*  --  2.35*  CREATININE  --  8.91*  --     Estimated Creatinine Clearance: 6 mL/min (by C-G formula based on Cr of 8.91).   Medications:  Scheduled:  . antiseptic oral rinse  7 mL Mouth Rinse BID  . calcitRIOL  1.5 mcg Oral Q T,Th,Sa-HD  . calcium acetate  667 mg Oral TID WC  . carvedilol  3.125 mg Oral BID WC  . cinacalcet  90 mg Oral Q breakfast  . darbepoetin (ARANESP) injection - DIALYSIS  100 mcg Intravenous Q Tue-HD  . gabapentin  300 mg Oral QHS  . insulin aspart  0-5 Units Subcutaneous QHS  . insulin aspart  0-9 Units Subcutaneous TID WC  . insulin glargine  10 Units Subcutaneous QHS  . ketoconazole  1 application Topical Daily  . latanoprost  1 drop Left Eye QHS  . multivitamin  1 tablet Oral QHS  . pantoprazole  40 mg Oral Daily  . polycarbophil  1,250 mg Oral Daily  . polyethylene glycol  17 g Oral Daily  . pravastatin  40 mg Oral Daily  . rOPINIRole  0.5 mg Oral QHS  . saccharomyces boulardii  250 mg Oral BID  . warfarin  5 mg Oral Q48H   And  . warfarin  7.5 mg Oral Q48H  . Warfarin - Pharmacist Dosing Inpatient   Does not apply q1800   Infusions:    Assessment: 69 yo female with DVT is currently on therapeutic coumadin.  INR today is 2.35.    Goal of Therapy:  INR 2-3 Monitor platelets by anticoagulation protocol: Yes   Plan:  - continue coumadin 5 mg alternating with 7.5 mg  - INR MWF  Stefanie [name], Tsz-Yin 02/20/2015,8:17 AM

## 2015-02-20 NOTE — Patient Care Conference (Signed)
Inpatient RehabilitationTeam Conference and Plan of Care Update Date: 02/20/2015   Time: 11:25 AM    Patient Name: Stefanie [name]      Medical Record Number: [id number]  Date of Birth: 1945-06-08 Sex: Female         Room/Bed: 4M07C/4M07C-01 Payor Info: Payor: MEDICARE / Plan: MEDICARE PART A AND B / Product Type: *No Product type* /    Admitting Diagnosis: debility no st  Admit Date/Time:  02/08/2015  6:39 PM Admission Comments: No comment available   Primary Diagnosis:  Debility Principal Problem: Debility  Patient Active Problem List   Diagnosis Date Noted  . History of CVA with residual deficit 02/11/2015  . Debility 02/08/2015  . DVT (deep venous thrombosis) ([place])   . CHF (congestive heart failure) ([place])   . Chronic diastolic congestive heart failure ([place])   . ESRD (end stage renal disease) ([place])   . Congestive dilated cardiomyopathy ([place])   . Left leg DVT ([place]) 02/01/2015  . Leukocytosis 02/01/2015  . Elevated troponin 02/01/2015  . SOB (shortness of breath) 02/01/2015  . Type 2 diabetes, controlled, with neuropathy ([place]) 12/20/2014  . Pain due to onychomycosis of toenail 12/20/2014  . 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 Mechanical complication of other vascular device, implant, and graft 05/19/2013  . PAD (peripheral artery disease) ([place]) 02/24/2012  . Murmur 12/24/2011  . Bradycardia-intermittent sinus 07/10/2010  . DYSPNEA ON EXERTION 02/25/2010  . CHEST PAIN, EXERTIONAL 02/25/2010  . DM 02/24/2010  . PROTEIN MALNUTRITION 02/24/2010  . HLD (hyperlipidemia) 02/24/2010  . ANEMIA 02/24/2010  . Essential hypertension 02/24/2010  . Cerebral artery occlusion with cerebral infarction ([place]) 02/24/2010  . ESRD on dialysis ([place]) 02/24/2010  . Secondary renal hyperparathyroidism ([place]) 02/24/2010    Expected Discharge Date: Expected Discharge Date: 02/22/15  Team Members Present: Physician leading conference: Dr. Alysia Penna Social Worker Present: Ovidio Kin, LCSW Nurse Present: Heather Roberts, RN PT Present: Kem Parkinson, PT OT Present: Simonne Come, OT SLP Present: Windell Moulding, SLP PPS Coordinator present : Daiva Nakayama, RN, CRRN     Current Status/Progress Goal Weekly Team Focus  Medical   Blood pressures improving, dialysis is proceeding  as planned, therapeutic on Coumadin  Home discharge with 24 7 supervision  Discharge planning   Bowel/Bladder   Continent bowel, bladder; Oliguric  HD Tues/Thurs/sat.  Scheduled Miralax  Monitor, PRN laxatives as needed   Swallow/Nutrition/ Hydration     na        ADL's   supervision/setup overall.  Pt requires increased time to complete all tasks  Supervision meal prep, shower transfers, bathing, and dynamic standing; Mod I toilet transfers, toileting and dressing tasks  sit > stand, standing tolerance, simple home making tasks, overall activity tolerance   Mobility   S bed mobility, close S for sit <>stand, transfers, gait with rollator  mod I bed mobility, S transfers, gait, stairs  postural retraining, LE strengthening, activity tolerance, safety awareness   Communication     na        Safety/Cognition/ Behavioral Observations  No unsafe behaviors, calls approp'ly no unsafe behaviors No injury, falls this admission. Increased safety awareness      Pain   Denies         Skin   CDI  Remain CDI  Monitor      *See Care Plan and progress notes for long and short-term goals.  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      Team Discussion:  Progressing toward her goals-safety risk in regards to falling. Team still recommends supervision level. Question if back to baseline functioning, was she safe at home prior to admission? Will discuss with pt and  daughter options and see which they choose.  Revisions to Treatment Plan:  Home versus NHP-both-pt and daughter have decided NH for few weeks is her best option   Continued Need for Acute Rehabilitation Level of Care: The patient requires daily medical management by a physician with specialized training in physical medicine and rehabilitation for the following conditions: Daily direction of a multidisciplinary physical rehabilitation program to ensure safe treatment while eliciting the highest outcome that is of practical value to the patient.: Yes Daily medical management of patient stability for increased activity during participation in an intensive rehabilitation regime.: Yes Daily analysis of laboratory values and/or radiology reports with any subsequent need for medication adjustment of medical intervention for : Neurological problems;Other  Stefanie [name], Stefanie [name] 02/21/2015, 8:20 AM

## 2015-02-20 NOTE — Progress Notes (Signed)
Occupational Therapy Session Note  Patient Details  Name: Stefanie [name] MRN: [id number] Date of Birth: 1946/01/09  Today's Date: 02/20/2015 OT Individual Time: 0930-1015 OT Individual Time Calculation (min): 45 min    Short Term Goals: Week 2:  OT Short Term Goal 1 (Week 2): Pt will perform LB bathing with supervision shower level sit to stand. OT Short Term Goal 2 (Week 2): Pt will perform LB dressing with supervision sit to stand.  OT Short Term Goal 3 (Week 2): Pt will perform toilet transfers and toileting with supervision sit to stand.  OT Short Term Goal 4 (Week 2): Pt will perform simple snack prep with RW and close supervision.   Skilled Therapeutic Interventions/Progress Updates:    Treatment session with focus on functional mobility in home environment and activity tolerance.  Pt already dressed prior to session.  Engaged in functional mobility with Rollator with focus on accessing home environment, with pt able to unlock and open/close ADL apt door with supervision as pt will need to leave home for HD.  Pt required increased seated rest break post ambulating to ADL apt.  Discussed d/c plan with pt reporting she would be willing to go to short term SNF prior to d/c home, if recommended.  Discussed current fall risk and decreased activity tolerance.  Engaged in PNF pattern reaching with 2# medicine ball progressing to completing in standing to address dynamic standing balance as needed for self-care tasks.   Therapy Documentation Precautions:  Precautions Precautions: Fall Precaution Comments: history of B CVAs Restrictions Weight Bearing Restrictions: No Pain: Pt with c/o pain in Rt knee, notified RN  See Function Navigator for Current Functional Status.   Therapy/Group: Individual Therapy  Simonne Come 02/20/2015, 10:16 AM

## 2015-02-20 NOTE — Progress Notes (Signed)
Social Work Patient ID: Stefanie [name], female   DOB: Dec 09, 1945, 69 y.o.   MRN: [id number] Met with pt and daughter-Felcia to discuss team conference supervision level goals and discharge. Pt feels she is doing better than she was at home. She is aware of daughter's concern and she actually has fallen at home prior to admission and laid on the floor until aide came. She is agreeable to going to  A NH for two weeks. But doesn't want to stay there any longer. Daughter felt more comfortable with this decision. Daughter has worked at [facility] and would like her to go there. Will check on bed availability.

## 2015-02-20 NOTE — Progress Notes (Signed)
Physical Therapy Session Note  Patient Details  Name: Stefanie [name] MRN: [id number] Date of Birth: 12/05/1945  Today's Date: 02/20/2015 PT Individual Time: 1030-1059 PT Individual Time Calculation (min): 29 min   Short Term Goals: Week 2:  PT Short Term Goal 1 (Week 2): = LTG due to estimated length of stay  Skilled Therapeutic Interventions/Progress Updates:    Pt received resting in recliner and agreeable to therapy session.  Pt amb x150' to therapy gym with rollator and supervision with 1 seated rest break with verbal cues for sequencing turn to sit on rollator seat.  PT instructed pt in BLE therex x10 reps LAQ, isometric hip add, standing hamstring curls, and minisquats.  Pt amb back to room in same manner as above with no seated rest break, returned to recliner and positioned with call bell in reach and needs met.   Therapy Documentation Precautions:  Precautions Precautions: Fall Precaution Comments: history of B CVAs Restrictions Weight Bearing Restrictions: No Pain: Pain Assessment Pain Assessment: No/denies pain Pain Score: 0-No pain   See Function Navigator for Current Functional Status.   Therapy/Group: Individual Therapy  Caitlin E Penven-Crew 02/20/2015, 11:29 AM  

## 2015-02-20 NOTE — Progress Notes (Signed)
[facility] ROUNDING NOTE   Subjective:   Interval History:  No complaints noted this morning   Objective:  Vital signs in last 24 hours:  Temp:  [96.5 F (35.8 C)-98.4 F (36.9 C)] 97 F (36.1 C) (12/07 0500) Pulse Rate:  [53-70] 60 (12/07 0500) Resp:  [14-18] 16 (12/07 0500) BP: (102-143)/(24-90) 128/47 mmHg (12/07 0500) SpO2:  [100 %] 100 % (12/07 0500) Weight:  [78.5 kg (173 lb 1 oz)-80.5 kg (177 lb 7.5 oz)] 78.563 kg (173 lb 3.2 oz) (12/07 0500)  Weight change: 2.9 kg (6 lb 6.3 oz) Filed Weights   02/19/15 1530 02/19/15 1915 02/20/15 0500  Weight: 80.5 kg (177 lb 7.5 oz) 78.5 kg (173 lb 1 oz) 78.563 kg (173 lb 3.2 oz)    Intake/Output: I/O last 3 completed shifts: In: 2450 [P.O.:2450] Out: 2001 [Other:2000; Stool:1]   Intake/Output this shift:  Total I/O In: 240 [P.O.:240] Out: -   CVS- RRR RS- CTA ABD- BS present soft non-distended Thigh AVG EXT- Trace edema    Basic Metabolic Panel:  Recent Labs Lab 02/14/15 2217 02/19/15 1540  NA 133* 132*  K 4.7 4.7  CL 95* 93*  CO2 29 27  GLUCOSE 190* 185*  BUN 46* 40*  CREATININE 9.09* 8.91*  CALCIUM 9.2 9.5  PHOS 4.5 4.5    Liver Function Tests:  Recent Labs Lab 02/14/15 2217 02/19/15 1540  ALBUMIN 2.8* 3.4*   No results for input(s): LIPASE, AMYLASE in the last 168 hours. No results for input(s): AMMONIA in the last 168 hours.  CBC:  Recent Labs Lab 02/14/15 2216 02/19/15 1540  WBC 6.3 4.2  HGB 8.6* 9.5*  HCT 25.8* 30.0*  MCV 99.6 100.3*  PLT 132* 180    Cardiac Enzymes: No results for input(s): CKTOTAL, CKMB, CKMBINDEX, TROPONINI in the last 168 hours.  BNP: Invalid input(s): POCBNP  CBG:  Recent Labs Lab 02/19/15 1017 02/19/15 1119 02/19/15 2008 02/19/15 2116 02/20/15 0647  GLUCAP 86 81 149* 177* 103*    Microbiology: Results for orders placed or performed during the hospital encounter of 11/13/10  Surgical pcr screen     Status: None   Collection  Time: 11/13/10  8:44 AM  Result Value Ref Range Status   MRSA, PCR NEGATIVE NEGATIVE Final   Staphylococcus aureus NEGATIVE NEGATIVE Final    Comment:        The Xpert SA Assay (FDA approved for NASAL specimens only), is one component of a comprehensive surveillance program.  It is not intended to diagnose infection nor to guide or monitor treatment.  Culture, blood (routine x 2)     Status: None   Collection Time: 11/14/10  2:40 AM  Result Value Ref Range Status   Specimen Description BLOOD LEFT HAND  Final   Special Requests BOTTLES DRAWN AEROBIC ONLY 5CC  Final   Culture  Setup Time [id number]  Final   Culture NO GROWTH 5 DAYS  Final   Report Status 11/20/2010 FINAL  Final  Culture, blood (routine x 2)     Status: None   Collection Time: 11/14/10  2:50 AM  Result Value Ref Range Status   Specimen Description BLOOD LEFT ARM  Final   Special Requests BOTTLES DRAWN AEROBIC ONLY [facility]  Final   Culture  Setup Time [id number]  Final   Culture NO GROWTH 5 DAYS  Final   Report Status 11/20/2010 FINAL  Final    Coagulation Studies:  Recent Labs  02/18/15 0617 02/20/15 0525  LABPROT  25.8* 25.5*  INR 2.39* 2.35*    Urinalysis: No results for input(s): COLORURINE, LABSPEC, PHURINE, GLUCOSEU, HGBUR, BILIRUBINUR, KETONESUR, PROTEINUR, UROBILINOGEN, NITRITE, LEUKOCYTESUR in the last 72 hours.  Invalid input(s): APPERANCEUR    Imaging: No results found.   Medications:     . antiseptic oral rinse  7 mL Mouth Rinse BID  . calcitRIOL  1.5 mcg Oral Q T,Th,Sa-HD  . calcium acetate  667 mg Oral TID WC  . carvedilol  3.125 mg Oral BID WC  . cinacalcet  90 mg Oral Q breakfast  . darbepoetin (ARANESP) injection - DIALYSIS  100 mcg Intravenous Q Tue-HD  . gabapentin  300 mg Oral QHS  . insulin aspart  0-5 Units Subcutaneous QHS  . insulin aspart  0-9 Units Subcutaneous TID WC  . insulin glargine  10 Units Subcutaneous QHS  . ketoconazole  1 application Topical Daily  .  latanoprost  1 drop Left Eye QHS  . multivitamin  1 tablet Oral QHS  . pantoprazole  40 mg Oral Daily  . polycarbophil  1,250 mg Oral Daily  . polyethylene glycol  17 g Oral Daily  . pravastatin  40 mg Oral Daily  . rOPINIRole  0.5 mg Oral QHS  . saccharomyces boulardii  250 mg Oral BID  . warfarin  5 mg Oral Q48H   And  . warfarin  7.5 mg Oral Q48H  . Warfarin - Pharmacist Dosing Inpatient   Does not apply q1800   sodium chloride, sodium chloride, sodium chloride, acetaminophen, albuterol, alteplase, bisacodyl, diclofenac sodium, docusate sodium, guaiFENesin, heparin, lidocaine (PF), lidocaine-prilocaine, MUSCLE RUB, nitroGLYCERIN, ondansetron, ondansetron, pentafluoroprop-tetrafluoroeth, traMADol  Assessment/ Plan:  [facility] TTS 3.5h 2/2 bath P4 75kg Heparin 4000 L thigh AVG Mircera 50 q 2 wks given 11/8  Calcitriol 1.5 ug   ESRD dialysis in TTS  LLE DVT Coumadin INR 2.35  Anemia Darbepoietin 122mcg weekly Hb 9.5    Bones Cinacalcet, 90 mg daily Calcium acetate 667 mg with meals and calcitriol 1.107mcg TTS Last caslium 9.5  Phos 4.5 12/1  Diabetes per primary team started Lantus   Continues in rehab for debility and weakness   Continues with psychiatric support     LOS: 12 Pinkney Venard W @TODAY @9 :11 AM

## 2015-02-20 NOTE — Progress Notes (Signed)
Recreational Therapy Discharge Summary Patient Details  Name: Stefanie [name] MRN: [id number] Date of Birth: [birth year] Today's Date: 02/20/2015  Long term goals set: 1  Long term goals met: 1  Comments on progress toward goals: Pt has met supervision/set up level for simple TR tasks seated.  Pt requires use of AE & additional time to complete tasks safely.  Pt is ready for discharge due to goal obtainment.  Reasons for discharge: discharge from hospital  Patient/family agrees with progress made and goals achieved: Yes  Stefanie [name] 02/20/2015, 12:05 PM

## 2015-02-20 NOTE — Progress Notes (Signed)
Physical Therapy Session Note  Patient Details  Name: Stefanie [name] MRN: [id number] Date of Birth: [birth year]  Today's Date: 02/20/2015 PT Individual Time: 0800-0900 and 1300-1415 PT Individual Time Calculation (min): 60 min and 75 min (total 135 min)   Short Term Goals: Week 2:  PT Short Term Goal 1 (Week 2): = LTG due to estimated length of stay  Skilled Therapeutic Interventions/Progress Updates:    Tx 1: Pt received seated in bed finishing breakfast, no c/o pain and agreeable to treatment. Functional ambulation in room with rollator and close S to retrieve clothes from drawer and take dirty clothes to closet and sit <>stand from rollator seat with minA for steadying rollator. Bathroom transfer from w/c with grab bars and S. Sitting on EOB for upper and lower body dressing with S. Daughter Solmon Ice present briefly and therapist discussed d/c plan with pt and daughter. Pt's preference is to go home, however daughter understands need for 24/7 S, and states none of the children are able to provide this due to work schedules, house set-ups, and one sibling with a child who has special needs. Pt and daughter understand possibility of needing placement in a facility that can provide increased assistance until independent enough to go home, or possibly long-term. Daughter is worried about pt's safety at home, reports at least 3 falls prior to recent hospitalization and pt had to wait several hours before aide or family came to assist. Pt finished dressing with S, sit <>stand using bed rails for standing balance. Remained seated in w/c at completion of session, all needs in reach.   Tx 2: Pt received seated in recliner eating lunch, no c/o pain and agreeable to treatment. Gait to therapy gym x150 with rollator and S. In apartment, performed bed mobility, bed transfer and chair transfer with S, verbal cues for locking brakes on rollator. Sit <>stand with out UEs while holding various size/weighted balls to  place on table in front of pt to improve LE strength, coordination, and body mechanics with sit <>stand. Standing balance and dynamic BUE reaching and coordination task (pipe tree) with close S for standing balance and cues for functional use of RUE. Gait to return to room with rollator and S. Bathroom transfer and all hygiene with close S. Remained seated in recliner with daughter present at completion of session.   Therapy Documentation Precautions:  Precautions Precautions: Fall Precaution Comments: history of B CVAs Restrictions Weight Bearing Restrictions: No Pain: Pain Assessment Pain Assessment: No/denies pain Pain Score: 0-No pain   See Function Navigator for Current Functional Status.   Therapy/Group: Individual Therapy  Luberta Mutter 02/20/2015, 8:52 AM

## 2015-02-20 NOTE — Progress Notes (Signed)
[facility]     PROGRESS NOTE    Subjective/Complaints: No issues overnite, tolerated HD  ROS: Pt denies , nausea, vomiting, abdominal pain, diarrhea, chest pain, shortness of breath,  Objective: Vital Signs: Blood pressure 128/47, pulse 60, temperature 97 F (36.1 C), temperature source Oral, resp. rate 16, weight 78.563 kg (173 lb 3.2 oz), SpO2 100 %. No results found.  Recent Labs  02/19/15 1540  WBC 4.2  HGB 9.5*  HCT 30.0*  PLT 180    Recent Labs  02/19/15 1540  NA 132*  K 4.7  CL 93*  GLUCOSE 185*  BUN 40*  CREATININE 8.91*  CALCIUM 9.5   CBG (last 3)   Recent Labs  02/19/15 2008 02/19/15 2116 02/20/15 0647  GLUCAP 149* 177* 103*    Wt Readings from Last 3 Encounters:  02/20/15 78.563 kg (173 lb 3.2 oz)  02/08/15 82.4 kg (181 lb 10.5 oz)  03/23/14 88.451 kg (195 lb)    Physical Exam:  Constitutional: She is oriented to person, place, and time. She appears well-developed and well-nourished.  HENT:  Head: Normocephalic and atraumatic.  Mouth/Throat: Oropharynx is clear and moist.  Eyes: Conjunctivae are normal. Pupils are equal, round, and reactive to light.  Neck: Normal range of motion. Neck supple.  Cardiovascular: Normal rate and regular rhythm.  No murmur heard. Respiratory: Effort normal and breath sounds normal. No respiratory distress. She has no wheezes.  GI: Soft. She exhibits no distension. There is no tenderness.  Musculoskeletal: She exhibits no edema. She exhibits no tenderness.  Left thigh/AVG-- No pain with palpation of LLE. Has some decreased sensation RLE.  Neurological: She is alert and oriented to person, place, and time.  Speech dysarthric (edentulous) and able to follow basic commands without difficulty. Sensory deficits RLE? Does sense pain in foot/ankle area. UE:RUE 3- delt, bi, tri, grip, 5/5 on left side, 4/5 BLE Skin: Skin is warm and dry. No rash noted. No erythema.   Psychiatric: She has a normal mood and affect. Her behavior is normal. Thought content normal.   Assessment/Plan: 1. Weakness and balance deficits secondary to multiple medical issues/debility which require 3+ hours per day of interdisciplinary therapy in a comprehensive inpatient rehab setting. Physiatrist is providing close team supervision and 24 hour management of active medical problems listed below. Physiatrist and rehab team continue to assess barriers to discharge/monitor patient progress toward functional and medical goals. .Function:  Bathing Bathing position   Position: Shower  Bathing parts Body parts bathed by patient: Right arm, Left arm, Chest, Abdomen, Front perineal area, Right upper leg, Left upper leg, Buttocks, Right lower leg, Left lower leg Body parts bathed by helper: Back  Bathing assist Assist Level: Supervision or verbal cues   Set up : To adjust water temperature, To obtain items  Upper Body Dressing/Undressing Upper body dressing   What is the patient wearing?: Pull over shirt/dress, Bra Bra - Perfomed by patient: Thread/unthread right bra strap, Thread/unthread left bra strap, Hook/unhook bra (pull down sports bra) Bra - Perfomed by helper: Hook/unhook bra (pull down sports bra) Pull over shirt/dress - Perfomed by patient: Thread/unthread right sleeve, Thread/unthread left sleeve, Put head through opening, Pull shirt over trunk          Upper body assist Assist Level: Set up   Set up : To obtain clothing/put away  Lower Body Dressing/Undressing Lower body dressing   What is the patient wearing?: Underwear, Pants, Socks, Shoes Underwear - Performed by  patient: Thread/unthread right underwear leg, Thread/unthread left underwear leg, Pull underwear up/down Underwear - Performed by helper: Pull underwear up/down Pants- Performed by patient: Thread/unthread right pants leg, Thread/unthread left pants leg, Pull pants up/down Pants- Performed by helper:  Thread/unthread left pants leg, Thread/unthread right pants leg Non-skid slipper socks- Performed by patient: Don/doff right sock, Don/doff left sock Non-skid slipper socks- Performed by helper: Don/doff right sock, Don/doff left sock Socks - Performed by patient: Don/doff right sock, Don/doff left sock   Shoes - Performed by patient: Don/doff right shoe, Don/doff left shoe Shoes - Performed by helper: Don/doff right shoe, Don/doff left shoe          Lower body assist Assist for lower body dressing: Supervision or verbal cues, Set up, More than reasonable time   Set up : To obtain clothing/put away  Toileting Toileting   Toileting steps completed by patient: Performs perineal hygiene, Adjust clothing prior to toileting, Adjust clothing after toileting Toileting steps completed by helper: Adjust clothing after toileting Toileting Assistive Devices: Grab bar or rail  Toileting assist Assist level: Supervision or verbal cues   Transfers Chair/bed transfer   Chair/bed transfer method: Ambulatory Chair/bed transfer assist level: Supervision or verbal cues Chair/bed transfer assistive device: Armrests, Medical sales representative     Max distance: 150 Assist level: Supervision or verbal cues   Wheelchair   Type: Manual Max wheelchair distance: 50 Assist Level: Touching or steadying assistance (Pt > 75%)  Cognition Comprehension Comprehension assist level: Understands complex 90% of the time/cues 10% of the time  Expression Expression assist level: Expresses complex 90% of the time/cues < 10% of the time  Social Interaction Social Interaction assist level: Interacts appropriately with others - No medications needed.  Problem Solving Problem solving assist level: Solves basic 90% of the time/requires cueing < 10% of the time  Memory Memory assist level: Recognizes or recalls 90% of the time/requires cueing < 10% of the time   Medical Problem List and Plan: 1. 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 LLE DVT Prophylaxis/Anticoagulation: Pharmaceutical: Coumadin therapeutic, d/c SCD Stop plavix since coumadin therapeutic, INR  3. Pain Management: On Neurontin at nights for neuropathy? Left leg and achilles area, exam is unremarkable, only calf soreness to palpation, suspect DVT =/- strain , add sports creme    Continue tylenol prn no c/os of foot pain 4. Mood: LCSW to follow up for evaluation and support.  5. Neuropsych: This patient is capable of making decisions on her own behalf. 6. Skin/Wound Care: Routine pressure relief measures 7. Fluids/Electrolytes/Nutrition: Continue 1200 cc FR daily.  8. CAD: Treat medically and monitor for symptoms/tolerance with increase in activity. 9. HTN: monitor BP bid on lisinopril and coreg. Adjusting per Nephro 10. ESRD: Appreciate Nephro notes. Continue HD T,Th, Sa in afternoons to help with tolerance of therapy during the day.  -AVG site without further bleeding, difficulty removing adequate volume due to low BPs   11. DM type 2: Used novolog bid prn at home. May benefit from scheduled insulin dosing. Will follow for pattern and Continue to monitor BS ac/hs and use SSI for elevated BS.  -HS lantus, 12/5 am CBG 99, 12/7 103 12. Anemia of chronic disease: On Aranesp. Monitor and transfuse prn symptoms or Hgb less than 7.0--hgb gradually improving,  9.5 13. Thrombocytopenia: Monitoring closely with heparin  on board.180K on 12/6 14. PVD: Off plavix ,multiple stents left iliac vein. --now on warfarin, INR therapeutic, INR 2.35 LOS (Days) 12 A FACE TO FACE EVALUATION WAS PERFORMED  Lakyn Mantione E 02/20/2015 7:00 AM

## 2015-02-21 ENCOUNTER — Inpatient Hospital Stay (HOSPITAL_COMMUNITY): Payer: Medicare Other | Admitting: Physical Therapy

## 2015-02-21 ENCOUNTER — Inpatient Hospital Stay (HOSPITAL_COMMUNITY): Payer: Medicare Other | Admitting: Occupational Therapy

## 2015-02-21 ENCOUNTER — Encounter: Payer: Medicare Other | Admitting: Cardiovascular Disease

## 2015-02-21 DIAGNOSIS — R0989 Other specified symptoms and signs involving the circulatory and respiratory systems: Secondary | ICD-10-CM

## 2015-02-21 LAB — RENAL FUNCTION PANEL
ALBUMIN: 3.1 g/dL — AB (ref 3.5–5.0)
ANION GAP: 11 (ref 5–15)
BUN: 44 mg/dL — ABNORMAL HIGH (ref 6–20)
CALCIUM: 9.2 mg/dL (ref 8.9–10.3)
CO2: 26 mmol/L (ref 22–32)
Chloride: 96 mmol/L — ABNORMAL LOW (ref 101–111)
Creatinine, Ser: 8.22 mg/dL — ABNORMAL HIGH (ref 0.44–1.00)
GFR calc non Af Amer: 4 mL/min — ABNORMAL LOW (ref >60)
GFR, EST AFRICAN AMERICAN: 5 mL/min — AB (ref >60)
Glucose, Bld: 141 mg/dL — ABNORMAL HIGH (ref 65–99)
PHOSPHORUS: 4.1 mg/dL (ref 2.5–4.6)
Potassium: 4.7 mmol/L (ref 3.5–5.1)
SODIUM: 133 mmol/L — AB (ref 135–145)

## 2015-02-21 LAB — GLUCOSE, CAPILLARY
GLUCOSE-CAPILLARY: 186 mg/dL — AB (ref 65–99)
Glucose-Capillary: 72 mg/dL (ref 65–99)
Glucose-Capillary: 91 mg/dL (ref 65–99)

## 2015-02-21 LAB — CBC
HCT: 27.6 % — ABNORMAL LOW (ref 36.0–46.0)
HEMOGLOBIN: 9 g/dL — AB (ref 12.0–15.0)
MCH: 32.8 pg (ref 26.0–34.0)
MCHC: 32.6 g/dL (ref 30.0–36.0)
MCV: 100.7 fL — ABNORMAL HIGH (ref 78.0–100.0)
Platelets: 152 10*3/uL (ref 150–400)
RBC: 2.74 MIL/uL — AB (ref 3.87–5.11)
RDW: 17.9 % — ABNORMAL HIGH (ref 11.5–15.5)
WBC: 4.1 10*3/uL (ref 4.0–10.5)

## 2015-02-21 MED ORDER — SODIUM CHLORIDE 0.9 % IV SOLN: 100.0000 mL | INTRAVENOUS | Status: DC | PRN

## 2015-02-21 MED ORDER — ALTEPLASE 2 MG IJ SOLR: 2.0000 mg | Freq: Once | INTRAMUSCULAR | Status: DC | PRN

## 2015-02-21 MED ORDER — PENTAFLUOROPROP-TETRAFLUOROETH EX AERO: 1.0000 "application " | INHALATION_SPRAY | CUTANEOUS | Status: DC | PRN

## 2015-02-21 MED ORDER — HEPARIN SODIUM (PORCINE) 1000 UNIT/ML DIALYSIS
1000.0000 [IU] | INTRAMUSCULAR | Status: DC | PRN
  Filled 2015-02-21: qty 1

## 2015-02-21 MED ORDER — LIDOCAINE HCL (PF) 1 % IJ SOLN: 5.0000 mL | INTRAMUSCULAR | Status: DC | PRN

## 2015-02-21 MED ORDER — LIDOCAINE-PRILOCAINE 2.5-2.5 % EX CREA: 1.0000 "application " | TOPICAL_CREAM | CUTANEOUS | Status: DC | PRN

## 2015-02-21 NOTE — Progress Notes (Signed)
Social Work Patient ID: Stefanie [name], female   DOB: Jan 22, 1946, 69 y.o.   MRN: [id number] Have faxed information to [facility] place pt and daughter would like for her to go. Daughter used to work there. Awaiting return call from admission coordinator.

## 2015-02-21 NOTE — NC FL2 (Deleted)
[place] LEVEL OF CARE SCREENING TOOL     IDENTIFICATION  Patient Name: Stefanie [name] Birthdate: 1945-06-27 Sex: female Admission Date (Current Location): 02/08/2015  [place] and [place] Number: [place] [id number] [place] and Address:  [facility] [place], [place] [address], [place], [place] [address]      Provider Number: [id number]  Attending Physician Name and Address:  Charlett Blake, MD  Relative Name and Phone Number:  Aliene Altes [phone]    Current Level of Care: Other (Comment) (Rehab) Recommended Level of Care: [place] Prior Approval Number:    Date Approved/Denied:   PASRR Number: [id number]  Discharge Plan: SNF    Current Diagnoses: Patient Active Problem List   Diagnosis Date Noted  . History of CVA with residual deficit 02/11/2015  . Debility 02/08/2015  . DVT (deep venous thrombosis) ([place])   . CHF (congestive heart failure) ([place])   . Chronic diastolic congestive heart failure ([place])   . ESRD (end stage renal disease) ([place])   . Congestive dilated cardiomyopathy ([place])   . Left leg DVT ([place]) 02/01/2015  . Leukocytosis 02/01/2015  . Elevated troponin 02/01/2015  . SOB (shortness of breath) 02/01/2015  . Type 2 diabetes, controlled, with neuropathy ([place]) 12/20/2014  . Pain due to onychomycosis of toenail 12/20/2014  . 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 Mechanical complication of other vascular device, implant, and graft 05/19/2013  . PAD (peripheral artery disease) ([place]) 02/24/2012  . Murmur 12/24/2011  . Bradycardia-intermittent sinus 07/10/2010  . DYSPNEA ON EXERTION 02/25/2010  . CHEST PAIN, EXERTIONAL 02/25/2010  . DM 02/24/2010  . PROTEIN MALNUTRITION 02/24/2010  . HLD (hyperlipidemia) 02/24/2010  . ANEMIA 02/24/2010  . Essential hypertension 02/24/2010  . Cerebral artery occlusion with cerebral infarction ([place]) 02/24/2010  . ESRD on dialysis ([place]) 02/24/2010  . 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 Normal          AMBULATORY STATUS RESPIRATION    Assist independent (requires supervision to min assist level) Normal      Personal Care Assistance Level of Assistance  Bathing, Dressing Bathing Assistance: Limited assistance   Dressing Assistance: Limited assistance      Functional Limitations Info                SPECIAL CARE FACTORS FREQUENCY  PT (By licensed PT), OT (By licensed OT)     PT Frequency: 5 x weekly OT Frequency: 5 x weekly           Additional Factors Info                  Current Medications (02/21/2015):  This is the current hospital active medication list Current Facility-Administered Medications  Medication Dose Route Frequency Provider Last Rate Last Dose  . 0.9 %  sodium chloride infusion  100 mL Intravenous PRN Ivan Anchors Love, PA-C      . 0.9 %  sodium chloride infusion  100 mL Intravenous PRN Edrick Oh, MD      . 0.9 %  sodium chloride infusion  100 mL Intravenous PRN Edrick Oh, MD      . acetaminophen (TYLENOL) tablet 325-650 mg  325-650  mg Oral Q4H PRN Bary Leriche, PA-C      . albuterol (PROVENTIL) (2.5 MG/3ML) 0.083% nebulizer solution 2.5 mg  2.5 mg Inhalation Q4H PRN Bary Leriche, PA-C   2.5 mg at 02/12/15 2147  . alteplase (CATHFLO ACTIVASE) injection 2 mg  2 mg Intracatheter Once PRN Edrick Oh, MD      . antiseptic oral rinse (CPC / CETYLPYRIDINIUM CHLORIDE 0.05%) solution 7 mL  7 mL Mouth Rinse BID Charlett Blake, MD   7 mL at 02/19/15 2000  . bisacodyl (DULCOLAX) suppository 10 mg  10 mg Rectal Daily PRN Bary Leriche, PA-C   10 mg at 02/09/15 1553  . calcitRIOL (ROCALTROL)  capsule 1.5 mcg  1.5 mcg Oral Q T,Th,Sa-HD Ivan Anchors Love, PA-C   1.5 mcg at 02/19/15 1248  . calcium acetate (PHOSLO) capsule 667 mg  667 mg Oral TID WC Ivan Anchors Love, PA-C   667 mg at 02/21/15 0751  . carvedilol (COREG) tablet 3.125 mg  3.125 mg Oral BID WC Roney Jaffe, MD   Stopped at 02/21/15 0800  . cinacalcet (SENSIPAR) tablet 90 mg  90 mg Oral Q breakfast Bary Leriche, PA-C   90 mg at 02/21/15 0750  . Darbepoetin Alfa (ARANESP) injection 100 mcg  100 mcg Intravenous Q Tue-HD Ernest Haber, PA-C   100 mcg at 02/19/15 1855  . diclofenac sodium (VOLTAREN) 1 % transdermal gel 2 g  2 g Topical QID PRN Bary Leriche, PA-C   2 g at 02/20/15 2053  . docusate sodium (ENEMEEZ) enema 283 mg  1 enema Rectal Daily PRN Roney Jaffe, MD      . gabapentin (NEURONTIN) capsule 300 mg  300 mg Oral QHS Ivan Anchors Love, PA-C   300 mg at 02/20/15 2100  . guaiFENesin (MUCINEX) 12 hr tablet 600 mg  600 mg Oral BID PRN Bary Leriche, PA-C   600 mg at 02/18/15 [id number]  . heparin injection 1,000 Units  1,000 Units Dialysis PRN Edrick Oh, MD      . insulin aspart (novoLOG) injection 0-5 Units  0-5 Units Subcutaneous QHS Bary Leriche, PA-C   2 Units at 02/20/15 2143  . insulin aspart (novoLOG) injection 0-9 Units  0-9 Units Subcutaneous TID WC Bary Leriche, PA-C   Stopped at 02/19/15 [id number]  . insulin glargine (LANTUS) injection 10 Units  10 Units Subcutaneous QHS Charlett Blake, MD   10 Units at 02/20/15 2154  . ketoconazole (NIZORAL) 2 % cream 1 application  1 application Topical Daily Bary Leriche, PA-C   1 application at [id number]  . latanoprost (XALATAN) 0.005 % ophthalmic solution 1 drop  1 drop Left Eye QHS Bary Leriche, PA-C   1 drop at 02/20/15 2100  . lidocaine (PF) (XYLOCAINE) 1 % injection 5 mL  5 mL Intradermal PRN Edrick Oh, MD      . lidocaine-prilocaine (EMLA) cream 1 application  1 application Topical PRN Edrick Oh, MD      . multivitamin (RENA-VIT) tablet 1 tablet  1 tablet Oral QHS  Bary Leriche, PA-C   1 tablet at 02/20/15 2100  . MUSCLE RUB CREA   Topical BID PRN Charlett Blake, MD      . nitroGLYCERIN (NITROSTAT) SL tablet 0.4 mg  0.4 mg Sublingual Q5 min PRN Bary Leriche, PA-C      . ondansetron [facility]) injection 4 mg  4 mg Intravenous Q6H PRN Bary Leriche, PA-C      .  ondansetron (ZOFRAN) tablet 4 mg  4 mg Oral Q6H PRN Bary Leriche, PA-C      . pantoprazole (PROTONIX) EC tablet 40 mg  40 mg Oral Daily Bary Leriche, PA-C   40 mg at 02/21/15 0750  . pentafluoroprop-tetrafluoroeth (GEBAUERS) aerosol 1 application  1 application Topical PRN Edrick Oh, MD      . polycarbophil [facility]) tablet 1,250 mg  1,250 mg Oral Daily Bary Leriche, PA-C   1,250 mg at 02/21/15 0750  . polyethylene glycol (MIRALAX / GLYCOLAX) packet 17 g  17 g Oral Daily Bary Leriche, PA-C   17 g at 02/21/15 0751  . pravastatin (PRAVACHOL) tablet 40 mg  40 mg Oral Daily Bary Leriche, PA-C   40 mg at 02/20/15 1752  . rOPINIRole (REQUIP) tablet 0.5 mg  0.5 mg Oral QHS Ivan Anchors Love, PA-C   0.5 mg at 02/20/15 2100  . saccharomyces boulardii (FLORASTOR) capsule 250 mg  250 mg Oral BID Bary Leriche, PA-C   250 mg at 02/21/15 0750  . traMADol (ULTRAM) tablet 50 mg  50 mg Oral Q6H PRN Bary Leriche, PA-C   50 mg at 02/19/15 0114  . warfarin (COUMADIN) tablet 5 mg  5 mg Oral Q48H Kimberly B Hammons, RPH   5 mg at 02/20/15 1752   And  . warfarin (COUMADIN) tablet 7.5 mg  7.5 mg Oral Q48H Kimberly B Hammons, RPH   7.5 mg at 02/19/15 2013  . Warfarin - Pharmacist Dosing Inpatient   Does not apply q1800 Charlett Blake, MD   0  at 02/09/15 1800     Discharge Medications: Please see discharge summary for a list of discharge medications.  Relevant Imaging Results:  Relevant Lab Results:  Recent Labs    Additional Information ESRD-dialysis in [place] M, W, F  Steffen Hase G, [place]

## 2015-02-21 NOTE — NC FL2 (Signed)
[place] LEVEL OF CARE SCREENING TOOL     IDENTIFICATION  Patient Name: Stefanie [name] Birthdate: 12-22-1945 Sex: female Admission Date (Current Location): 02/08/2015  [place] and [place] Number: [place] [id number] [place] and Address:  [facility], [place] [address], [place], [place] [address]      Provider Number: [id number]  Attending Physician Name and Address:  Charlett Blake, MD  Relative Name and Phone Number:  Aliene Altes [phone]    Current Level of Care: Other (Comment) (Rehab) Recommended Level of Care: [place] Prior Approval Number:    Date Approved/Denied:   PASRR Number: [id number]  Discharge Plan: SNF    Current Diagnoses: Patient Active Problem List   Diagnosis Date Noted  . History of CVA with residual deficit 02/11/2015  . Debility 02/08/2015  . DVT (deep venous thrombosis) ([place])   . CHF (congestive heart failure) ([place])   . Chronic diastolic congestive heart failure ([place])   . ESRD (end stage renal disease) ([place])   . Congestive dilated cardiomyopathy ([place])   . Left leg DVT ([place]) 02/01/2015  . Leukocytosis 02/01/2015  . Elevated troponin 02/01/2015  . SOB (shortness of breath) 02/01/2015  . Type 2 diabetes, controlled, with neuropathy ([place]) 12/20/2014  . Pain due to onychomycosis of toenail 12/20/2014  . 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 Mechanical complication of other vascular device, implant, and graft 05/19/2013  . PAD (peripheral artery disease) ([place]) 02/24/2012  . Murmur 12/24/2011  . Bradycardia-intermittent sinus 07/10/2010  . DYSPNEA ON EXERTION 02/25/2010  . CHEST PAIN, EXERTIONAL 02/25/2010  . DM 02/24/2010  . PROTEIN MALNUTRITION 02/24/2010  . HLD (hyperlipidemia) 02/24/2010  . ANEMIA 02/24/2010  . Essential hypertension 02/24/2010  . Cerebral artery occlusion with cerebral infarction ([place]) 02/24/2010  . ESRD on dialysis ([place]) 02/24/2010  . Secondary renal  hyperparathyroidism ([place]) 02/24/2010    Orientation ACTIVITIES/SOCIAL BLADDER RESPIRATION    Self, Time, Situation, Place  Active Continent Normal  BEHAVIORAL SYMPTOMS/MOOD NEUROLOGICAL BOWEL NUTRITION STATUS      Continent Diet (renal diet)  PHYSICIAN VISITS COMMUNICATION OF NEEDS Height & Weight Skin    Verbally 5\' 4"  (162.6 cm) 181 lbs. Normal          AMBULATORY STATUS RESPIRATION    Assist independent (requires supervision to min assist level) Normal      Personal Care Assistance Level of Assistance  Bathing, Dressing Bathing Assistance: Limited assistance   Dressing Assistance: Limited assistance      Functional Limitations Info                SPECIAL CARE FACTORS FREQUENCY  PT (By licensed PT), OT (By licensed OT)     PT Frequency: 5 x weekly OT Frequency: 5 x weekly           Additional Factors Info                  Current Medications (02/21/2015):  This is the current hospital active medication list Current Facility-Administered Medications  Medication Dose Route Frequency Provider Last Rate Last Dose  . 0.9 %  sodium chloride infusion  100 mL Intravenous PRN Ivan Anchors Love, PA-C      . 0.9 %  sodium chloride infusion  100 mL Intravenous PRN Edrick Oh, MD      . 0.9 %  sodium chloride infusion  100 mL Intravenous PRN Edrick Oh, MD      . acetaminophen (TYLENOL) tablet 325-650 mg  325-650  mg Oral Q4H PRN Bary Leriche, PA-C      . albuterol (PROVENTIL) (2.5 MG/3ML) 0.083% nebulizer solution 2.5 mg  2.5 mg Inhalation Q4H PRN Bary Leriche, PA-C   2.5 mg at 02/12/15 2147  . alteplase (CATHFLO ACTIVASE) injection 2 mg  2 mg Intracatheter Once PRN Edrick Oh, MD      . antiseptic oral rinse (CPC / CETYLPYRIDINIUM CHLORIDE 0.05%) solution 7 mL  7 mL Mouth Rinse BID Charlett Blake, MD   7 mL at 02/19/15 2000  . bisacodyl (DULCOLAX) suppository 10 mg  10 mg Rectal Daily PRN Bary Leriche, PA-C   10 mg at 02/09/15 1553  . calcitRIOL (ROCALTROL)  capsule 1.5 mcg  1.5 mcg Oral Q T,Th,Sa-HD Ivan Anchors Love, PA-C   1.5 mcg at 02/19/15 1248  . calcium acetate (PHOSLO) capsule 667 mg  667 mg Oral TID WC Ivan Anchors Love, PA-C   667 mg at 02/21/15 0751  . carvedilol (COREG) tablet 3.125 mg  3.125 mg Oral BID WC Roney Jaffe, MD   Stopped at 02/21/15 0800  . cinacalcet (SENSIPAR) tablet 90 mg  90 mg Oral Q breakfast Bary Leriche, PA-C   90 mg at 02/21/15 0750  . Darbepoetin Alfa (ARANESP) injection 100 mcg  100 mcg Intravenous Q Tue-HD Ernest Haber, PA-C   100 mcg at 02/19/15 1855  . diclofenac sodium (VOLTAREN) 1 % transdermal gel 2 g  2 g Topical QID PRN Bary Leriche, PA-C   2 g at 02/20/15 2053  . docusate sodium (ENEMEEZ) enema 283 mg  1 enema Rectal Daily PRN Roney Jaffe, MD      . gabapentin (NEURONTIN) capsule 300 mg  300 mg Oral QHS Ivan Anchors Love, PA-C   300 mg at 02/20/15 2100  . guaiFENesin (MUCINEX) 12 hr tablet 600 mg  600 mg Oral BID PRN Bary Leriche, PA-C   600 mg at 02/18/15 [id number]  . heparin injection 1,000 Units  1,000 Units Dialysis PRN Edrick Oh, MD      . insulin aspart (novoLOG) injection 0-5 Units  0-5 Units Subcutaneous QHS Bary Leriche, PA-C   2 Units at 02/20/15 2143  . insulin aspart (novoLOG) injection 0-9 Units  0-9 Units Subcutaneous TID WC Bary Leriche, PA-C   Stopped at 02/19/15 [phone]  . insulin glargine (LANTUS) injection 10 Units  10 Units Subcutaneous QHS Charlett Blake, MD   10 Units at 02/20/15 2154  . ketoconazole (NIZORAL) 2 % cream 1 application  1 application Topical Daily Bary Leriche, PA-C   1 application at [id number]  . latanoprost (XALATAN) 0.005 % ophthalmic solution 1 drop  1 drop Left Eye QHS Bary Leriche, PA-C   1 drop at 02/20/15 2100  . lidocaine (PF) (XYLOCAINE) 1 % injection 5 mL  5 mL Intradermal PRN Edrick Oh, MD      . lidocaine-prilocaine (EMLA) cream 1 application  1 application Topical PRN Edrick Oh, MD      . multivitamin (RENA-VIT) tablet 1 tablet  1 tablet Oral QHS  Bary Leriche, PA-C   1 tablet at 02/20/15 2100  . MUSCLE RUB CREA   Topical BID PRN Charlett Blake, MD      . nitroGLYCERIN (NITROSTAT) SL tablet 0.4 mg  0.4 mg Sublingual Q5 min PRN Bary Leriche, PA-C      . ondansetron [facility]) injection 4 mg  4 mg Intravenous Q6H PRN Bary Leriche, PA-C      .  ondansetron (ZOFRAN) tablet 4 mg  4 mg Oral Q6H PRN Bary Leriche, PA-C      . pantoprazole (PROTONIX) EC tablet 40 mg  40 mg Oral Daily Bary Leriche, PA-C   40 mg at 02/21/15 0750  . pentafluoroprop-tetrafluoroeth (GEBAUERS) aerosol 1 application  1 application Topical PRN Edrick Oh, MD      . polycarbophil [facility]) tablet 1,250 mg  1,250 mg Oral Daily Bary Leriche, PA-C   1,250 mg at 02/21/15 0750  . polyethylene glycol (MIRALAX / GLYCOLAX) packet 17 g  17 g Oral Daily Bary Leriche, PA-C   17 g at 02/21/15 0751  . pravastatin (PRAVACHOL) tablet 40 mg  40 mg Oral Daily Bary Leriche, PA-C   40 mg at 02/20/15 1752  . rOPINIRole (REQUIP) tablet 0.5 mg  0.5 mg Oral QHS Ivan Anchors Love, PA-C   0.5 mg at 02/20/15 2100  . saccharomyces boulardii (FLORASTOR) capsule 250 mg  250 mg Oral BID Bary Leriche, PA-C   250 mg at 02/21/15 0750  . traMADol (ULTRAM) tablet 50 mg  50 mg Oral Q6H PRN Bary Leriche, PA-C   50 mg at 02/19/15 0114  . warfarin (COUMADIN) tablet 5 mg  5 mg Oral Q48H Kimberly B Hammons, RPH   5 mg at 02/20/15 1752   And  . warfarin (COUMADIN) tablet 7.5 mg  7.5 mg Oral Q48H Kimberly B Hammons, RPH   7.5 mg at 02/19/15 2013  . Warfarin - Pharmacist Dosing Inpatient   Does not apply q1800 Charlett Blake, MD   0  at 02/09/15 1800     Discharge Medications: Please see discharge summary for a list of discharge medications.  Relevant Imaging Results:  Relevant Lab Results:  Recent Labs    Additional Information ESRD-dialysis in [place] M, W, F  T[place]en V[place]ande G, [place]

## 2015-02-21 NOTE — Progress Notes (Signed)
Social Work Patient ID: Stefanie [name], female   DOB: Jun 27, 1945, 69 y.o.   MRN: [id number] Spoke with [facility] who may have semi-private room but family will need to change her dialysis schedule to M, W, F for their Lucianne Lei to be able to take or the family would be  Responsible for taking her. Spoke with daughter she does want the bed and will change Mom's dialysis schedule. She will let me know in the morning about the bed. [name] aware of  Tentative plan. See in am.

## 2015-02-21 NOTE — Progress Notes (Signed)
Physical Therapy Session Note  Patient Details  Name: Stefanie [name] MRN: [id number] Date of Birth: 06/29/1945  Today's Date: 02/21/2015 PT Individual Time: 1345-1430 and 0800-0900 PT Individual Time Calculation (min): 45 min and 60 min (total 105 min)   Short Term Goals: Week 2:  PT Short Term Goal 1 (Week 2): = LTG due to estimated length of stay  Skilled Therapeutic Interventions/Progress Updates:    Tx 1: Pt received seated in bed with no c/o pain and agreeable to treatment. Performed upper/lower body dressing sitting on EOB with minA for time management as pt will shower with OT later today. Min guard for standing balance due to extreme forward flexed posture and instability with UEs off rollator to pull pants up. Several minute seated rest break needed after completing dressing due to fatigue and SOB. W/c propulsion x50' with minA and verbal cues, demonstration for UE placement on rims in shoulder extension for bigger push forward. Stair training on 3- and 6-inch stair height x12 steps total with min guard, BUEs, step-to pattern and repetitive verbal/tactile cues for upright posture to reduce forward trunk flexion. Car transfer performed x2 trials; initial trial required two attempts when getting up from the car seat, cues for hand placement and body positioning to improve sit >stand. Second trial performed with S. Returned to room and remained seated in w/c with all needs in reach at completion of session.   Tx 2: Pt received seated in w/c, no c/o pain and agreeable to treatment. W/c propulsion x50' with minA. Gait training for two trials of ~75' with S and rollator, cues for upright posture. Nustep x10 min on level 4 with BUE/BLE. W/c propulsion x50' with minA due to RUE weakness and decreased grip strength. Stand pivot to return to bed with S. Remained supine in bed with all needs in reach at completion of session.   Therapy Documentation Precautions:  Precautions Precautions:  Fall Precaution Comments: history of B CVAs Restrictions Weight Bearing Restrictions: No Pain: Pain Assessment Pain Assessment: No/denies pain Pain Score: 0-No pain   See Function Navigator for Current Functional Status.   Therapy/Group: Individual Therapy  Luberta Mutter 02/21/2015, 8:04 AM

## 2015-02-21 NOTE — Progress Notes (Signed)
Social Work [name], LCSW Social Worker Signed Physical Medicine and Rehabilitation Patient Care Conference 02/20/2015  1:25 PM    Expand All Collapse All   Inpatient RehabilitationTeam Conference and Plan of Care Update Date: 02/20/2015   Time: 11:25 AM     Patient Name: Stefanie [name]       Medical Record Number: [id number]  Date of Birth: 1945/09/21 Sex: Female         Room/Bed: 4M07C/4M07C-01 Payor Info: Payor: MEDICARE / Plan: MEDICARE PART A AND B / Product Type: *No Product type* /    Admitting Diagnosis: debility no st   Admit Date/Time:  02/08/2015  6:39 PM Admission Comments: No comment available   Primary Diagnosis:  Debility Principal Problem: Debility    Patient Active Problem List     Diagnosis  Date Noted   .  History of CVA with residual deficit  02/11/2015   .  Debility  02/08/2015   .  DVT (deep venous thrombosis) ([place])     .  CHF (congestive heart failure) ([place])     .  Chronic diastolic congestive heart failure ([place])     .  ESRD (end stage renal disease) ([place])     .  Congestive dilated cardiomyopathy ([place])     .  Left leg DVT ([place])  02/01/2015   .  Leukocytosis  02/01/2015   .  Elevated troponin  02/01/2015   .  SOB (shortness of breath)  02/01/2015   .  Type 2 diabetes, controlled, with neuropathy ([place])  12/20/2014   .  Pain due to onychomycosis of toenail  12/20/2014   .  Iliac vein stenosis, left  03/23/2014   .  Mechanical complication of other vascular device, implant, and graft  05/19/2013   .  PAD (peripheral artery disease) ([place])  02/24/2012   .  Murmur  12/24/2011   .  Bradycardia-intermittent sinus  07/10/2010   .  DYSPNEA ON EXERTION  02/25/2010   .  CHEST PAIN, EXERTIONAL  02/25/2010   .  DM  02/24/2010   .  PROTEIN MALNUTRITION  02/24/2010   .  HLD (hyperlipidemia)  02/24/2010   .  ANEMIA  02/24/2010   .  Essential hypertension  02/24/2010   .  Cerebral artery occlusion with cerebral infarction ([place])  02/24/2010   .  ESRD on  dialysis ([place])  02/24/2010   .  Secondary renal hyperparathyroidism ([place])  02/24/2010     Expected Discharge Date: Expected Discharge Date: 02/22/15  Team Members Present: Physician leading conference: Dr. Alysia Penna Social Worker Present: Ovidio Kin, LCSW Nurse Present: Heather Roberts, RN PT Present: Kem Parkinson, PT OT Present: Simonne Come, OT SLP Present: Windell Moulding, SLP PPS Coordinator present : Daiva Nakayama, RN, CRRN        Current Status/Progress  Goal  Weekly Team Focus   Medical     Blood pressures improving, dialysis is proceeding  as planned, therapeutic on Coumadin  Home discharge with 24 7 supervision   Discharge planning   Bowel/Bladder     Continent bowel, bladder; Oliguric   HD Tues/Thurs/sat.  Scheduled Miralax   Monitor, PRN laxatives as needed   Swallow/Nutrition/ Hydration       na         ADL's     supervision/setup overall.  Pt requires increased time to complete all tasks  Supervision meal prep, shower transfers, bathing, and dynamic standing; Mod I toilet transfers, toileting and dressing tasks  sit > stand, standing tolerance, simple home making tasks, overall activity tolerance   Mobility     S bed mobility, close S for sit <>stand, transfers, gait with rollator  mod I bed mobility, S transfers, gait, stairs   postural retraining, LE strengthening, activity tolerance, safety awareness   Communication       na         Safety/Cognition/ Behavioral Observations    No unsafe behaviors, calls approp'ly no unsafe behaviors  No injury, falls this admission. Increased safety awareness       Pain     Denies         Skin     CDI  Remain CDI  Monitor      *See Care Plan and progress notes for long and short-term goals.    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       Team Discussion:    Progressing toward her goals-safety risk in regards to falling. Team still recommends supervision level. Question if back to baseline functioning, was she safe at home prior to admission? Will discuss with pt and daughter options and see which they choose.   Revisions to Treatment Plan:    Home versus NHP-both-pt and daughter have decided NH for few weeks is her best option    Continued Need for Acute Rehabilitation Level of Care: The patient requires daily medical management by a physician with specialized training in physical medicine and rehabilitation for the following conditions: Daily direction of a multidisciplinary physical rehabilitation program to ensure safe treatment while eliciting the highest outcome that is of practical value to the patient.: Yes Daily medical management of patient stability for increased activity during participation in an intensive rehabilitation regime.: Yes Daily analysis of laboratory values and/or radiology reports with any subsequent need for medication adjustment of medical intervention for : Neurological problems;Other  Kamaryn Grimley, Gardiner Rhyme 02/21/2015, 8:20 AM                 [name], LCSW Social Worker Signed Physical Medicine and Rehabilitation Patient Care Conference 02/13/2015  1:08 PM    Expand All Collapse All   Inpatient RehabilitationTeam Conference and Plan of Care Update Date: 02/13/2015   Time: 11:00 AM     Patient Name: Stefanie [name]       Medical Record Number: [id number]  Date of Birth: 1945/04/22 Sex: Female         Room/Bed: 4M07C/4M07C-01 Payor Info: Payor: MEDICARE / Plan: MEDICARE PART A AND B / Product Type: *No Product type* /    Admitting Diagnosis: debility no st   Admit Date/Time:  02/08/2015  6:39 PM Admission Comments: No comment available   Primary Diagnosis:  Debility Principal Problem: Debility    Patient Active  Problem List     Diagnosis  Date Noted   .  History of CVA with residual deficit  02/11/2015   .  Debility  02/08/2015   .  DVT (deep venous thrombosis) ([place])     .  CHF (congestive heart failure) ([place])     .  Chronic diastolic congestive heart failure ([place])     .  ESRD (end stage renal disease) ([place])     .  Congestive dilated cardiomyopathy ([place])     .  Left leg DVT ([place])  02/01/2015   .  Leukocytosis  02/01/2015   .  Elevated troponin  02/01/2015   .  SOB (shortness of breath)  02/01/2015   .  Type 2 diabetes, controlled, with neuropathy ([place])  12/20/2014   .  Pain due to onychomycosis of toenail  12/20/2014   .  Iliac vein stenosis, left  03/23/2014   .  Mechanical complication of other vascular device, implant, and graft  05/19/2013   .  PAD (peripheral artery disease) ([place])  02/24/2012   .  Murmur  12/24/2011   .  Bradycardia-intermittent sinus  07/10/2010   .  DYSPNEA ON EXERTION  02/25/2010   .  CHEST PAIN, EXERTIONAL  02/25/2010   .  DM  02/24/2010   .  PROTEIN MALNUTRITION  02/24/2010   .  HLD (hyperlipidemia)  02/24/2010   .  ANEMIA  02/24/2010   .  Essential hypertension  02/24/2010   .  Cerebral artery occlusion with cerebral infarction ([place])  02/24/2010   .  ESRD on dialysis ([place])  02/24/2010   .  Secondary renal hyperparathyroidism ([place])  02/24/2010     Expected Discharge Date: Expected Discharge Date: 02/22/15  Team Members Present: Physician leading conference: Dr. Alysia Penna Social Worker Present: Ovidio Kin, LCSW Nurse Present: Dorien Chihuahua, RN PT Present: Kem Parkinson, PT OT Present: Simonne Come, OT SLP Present: Windell Moulding, SLP PPS Coordinator present : Daiva Nakayama, RN, CRRN        Current Status/Progress  Goal  Weekly Team Focus   Medical     off IV heparin, poor posture, soft BPs with difficulty adequately dialyzing pt  ? home vs SNF  improve activity tolerance and safety awareness    Bowel/Bladder     Continent of bowel; anuric;  LBM 11/28   Mod I  Assess and treat for constipation as needed    Swallow/Nutrition/ Hydration       na         ADL's     min-mod assist self-care tasks, mod assist stand pivot transfers, Pt requires increased time to complete all tasks   Supervision overall  Sit > stand, standing tolerance, transfers    Mobility     S bed mobility, mod/max sit <>stand, mod/maxA stand pivot tranfers, min/mod gait with RW  mod I bed mobility, S transfers, gait, stairs   Sit <>stand, transfers, standing balance/posture, gait training   Communication       na         Safety/Cognition/ Behavioral Observations    No unsafe behaviors noted         Pain     No complaints of pain  < 3  Assess and treat for pain q shift and prn   Skin     Skin intact  Min assist  Assess skin q shift and prn      *See Care Plan and progress notes for long and short-term goals.    Barriers to Discharge:  very poor endurance     Possible Resolutions to Barriers:   cont rehab as above     Discharge Planning/Teaching Needs:   Pt lives alone and needs to be mod/i to return home-unsure if she was safe before this admission alone. Has PCS worker daily 2-3 hours        Team Discussion:    Goals set at supervision level-pt lives alone. Team concerned she  will not be able to do sit to stand independently. R-Knee pain and has weakness from past CVA's. Off heprin and on coumadin, anemia stable. Low endurance but has improved while here. Disucss best discharge option   Revisions to Treatment Plan:    Ques home versus NHP    Continued Need for Acute Rehabilitation Level of Care: The patient requires daily medical management by a physician with specialized training in physical medicine and rehabilitation for the following conditions: Daily direction of a multidisciplinary physical rehabilitation program to ensure safe treatment while eliciting the highest outcome that is of practical value to the patient.: Yes Daily medical  management of patient stability for increased activity during participation in an intensive rehabilitation regime.: Yes Daily analysis of laboratory values and/or radiology reports with any subsequent need for medication adjustment of medical intervention for : Other  [name] 02/13/2015, 1:08 PM                  Patient ID: Stefanie [name], female   DOB: Sep 15, 1945, 69 y.o.   MRN: [id number]

## 2015-02-21 NOTE — Progress Notes (Signed)
Occupational Therapy Session Note  Patient Details  Name: [name] Senner MRN: [id number] Date of Birth: 02/18/1946  Today's Date: 02/21/2015 OT Individual Time: [id number] OT Individual Time Calculation (min): 53 min    Short Term Goals: Week 2:  OT Short Term Goal 1 (Week 2): Pt will perform LB bathing with supervision shower level sit to stand. OT Short Term Goal 2 (Week 2): Pt will perform LB dressing with supervision sit to stand.  OT Short Term Goal 3 (Week 2): Pt will perform toilet transfers and toileting with supervision sit to stand.  OT Short Term Goal 4 (Week 2): Pt will perform simple snack prep with RW and close supervision.   Skilled Therapeutic Interventions/Progress Updates:    Pt completed functional mobility and wheelchair mobility during session.  Therapist applied theraband to right wheelchair rim to assist with pushing the wheelchair.  She was able to complete 3/4 of pushing to the therapy gym. Worked on sit to stand transitions from therapy mat.  Pt needing min assist secondary to one LOB posteriorly in standing posteriorly.  Attempted to make splint for left thumb IP joint as pt demonstrates increased flexor tone with all attempted functional use and cannot perform opposition secondary to the excessive tone.  Unable to make splint sufficient enough to limit IP flexion secondary to the amount of tone present and splint not being able to provide enough support to maintain IP extension.  Finished session with functional mobility back to the room from the ortho gym with min guard assist.  Pt left in wheelchair with call bell in reach.    Therapy Documentation Precautions:  Precautions Precautions: Fall Precaution Comments: history of B CVAs Restrictions Weight Bearing Restrictions: No  Pain: Pain Assessment Pain Assessment: No/denies pain ADL: See Function Navigator for Current Functional Status.   Therapy/Group: Individual Therapy  Yanuel Tagg  OTR/L 02/21/2015, 12:56 PM

## 2015-02-21 NOTE — Progress Notes (Signed)
Occupational Therapy Session Note  Patient Details  Name: [name] Steveson MRN: [id number] Date of Birth: [birth year]  Today's Date: 02/21/2015 OT Individual Time: 1015-1100 OT Individual Time Calculation (min): 45 min    Short Term Goals: Week 2:  OT Short Term Goal 1 (Week 2): Pt will perform LB bathing with supervision shower level sit to stand. OT Short Term Goal 2 (Week 2): Pt will perform LB dressing with supervision sit to stand.  OT Short Term Goal 3 (Week 2): Pt will perform toilet transfers and toileting with supervision sit to stand.  OT Short Term Goal 4 (Week 2): Pt will perform simple snack prep with RW and close supervision.   Skilled Therapeutic Interventions/Progress Updates:    ADL retraining with focus on functional mobility, transfers, and increased safety with self-care tasks.  Pt requested to complete bathing at shower level.  Pt ambulated to toilet with Rollator and completed toileting and doffing clothing in preparation for shower all at supervision level.  Bathing completed with lateral leans in shower with supervision, demonstrating improved postural control with lateral leans and when reaching towards feet for bathing and donning socks and shoes.  Dressing completed at sit > stand level with increased time.  Therapy Documentation Precautions:  Precautions Precautions: Fall Precaution Comments: history of B CVAs Restrictions Weight Bearing Restrictions: No Pain: Pain Assessment Pain Assessment: No/denies pain Pain Score: 0-No pain  See Function Navigator for Current Functional Status.   Therapy/Group: Individual Therapy  Simonne Come 02/21/2015, 12:02 PM

## 2015-02-21 NOTE — Progress Notes (Signed)
[facility] ROUNDING NOTE   Subjective:   Interval History:  No complaints   Objective:  Vital signs in last 24 hours:  Temp:  [98.2 F (36.8 C)-98.4 F (36.9 C)] 98.2 F (36.8 C) (12/08 0603) Pulse Rate:  [54-69] 54 (12/08 0603) Resp:  [16-17] 16 (12/08 0603) BP: (123)/(49-54) 123/54 mmHg (12/08 0603) SpO2:  [99 %-100 %] 100 % (12/08 0603) Weight:  [78.336 kg (172 lb 11.2 oz)] 78.336 kg (172 lb 11.2 oz) (12/08 0603)  Weight change: -2.164 kg (-4 lb 12.3 oz) Filed Weights   02/19/15 1915 02/20/15 0500 02/21/15 0603  Weight: 78.5 kg (173 lb 1 oz) 78.563 kg (173 lb 3.2 oz) 78.336 kg (172 lb 11.2 oz)    Intake/Output: I/O last 3 completed shifts: In: 840 [P.O.:840] Out: 2000 [Other:2000]   Intake/Output this shift:     CVS- RRR RS- CTA ABD- BS present soft non-distended Thigh AVG EXT- Trace edema    Basic Metabolic Panel:  Recent Labs Lab 02/14/15 2217 02/19/15 1540  NA 133* 132*  K 4.7 4.7  CL 95* 93*  CO2 29 27  GLUCOSE 190* 185*  BUN 46* 40*  CREATININE 9.09* 8.91*  CALCIUM 9.2 9.5  PHOS 4.5 4.5    Liver Function Tests:  Recent Labs Lab 02/14/15 2217 02/19/15 1540  ALBUMIN 2.8* 3.4*   No results for input(s): LIPASE, AMYLASE in the last 168 hours. No results for input(s): AMMONIA in the last 168 hours.  CBC:  Recent Labs Lab 02/14/15 2216 02/19/15 1540  WBC 6.3 4.2  HGB 8.6* 9.5*  HCT 25.8* 30.0*  MCV 99.6 100.3*  PLT 132* 180    Cardiac Enzymes: No results for input(s): CKTOTAL, CKMB, CKMBINDEX, TROPONINI in the last 168 hours.  BNP: Invalid input(s): POCBNP  CBG:  Recent Labs Lab 02/20/15 0647 02/20/15 1130 02/20/15 1650 02/20/15 2122 02/21/15 0641  GLUCAP 103* 103* 133* 228* 19    Microbiology: Results for orders placed or performed during the hospital encounter of 11/13/10  Surgical pcr screen     Status: None   Collection Time: 11/13/10  8:44 AM  Result Value Ref Range Status   MRSA, PCR  NEGATIVE NEGATIVE Final   Staphylococcus aureus NEGATIVE NEGATIVE Final    Comment:        The Xpert SA Assay (FDA approved for NASAL specimens only), is one component of a comprehensive surveillance program.  It is not intended to diagnose infection nor to guide or monitor treatment.  Culture, blood (routine x 2)     Status: None   Collection Time: 11/14/10  2:40 AM  Result Value Ref Range Status   Specimen Description BLOOD LEFT HAND  Final   Special Requests BOTTLES DRAWN AEROBIC ONLY 5CC  Final   Culture  Setup Time [id number]  Final   Culture NO GROWTH 5 DAYS  Final   Report Status 11/20/2010 FINAL  Final  Culture, blood (routine x 2)     Status: None   Collection Time: 11/14/10  2:50 AM  Result Value Ref Range Status   Specimen Description BLOOD LEFT ARM  Final   Special Requests BOTTLES DRAWN AEROBIC ONLY 5CC  Final   Culture  Setup Time [id number]  Final   Culture NO GROWTH 5 DAYS  Final   Report Status 11/20/2010 FINAL  Final    Coagulation Studies:  Recent Labs  02/20/15 0525  LABPROT 25.5*  INR 2.35*    Urinalysis: No results for input(s): COLORURINE, LABSPEC, [place], GLUCOSEU,  HGBUR, BILIRUBINUR, KETONESUR, PROTEINUR, UROBILINOGEN, NITRITE, LEUKOCYTESUR in the last 72 hours.  Invalid input(s): APPERANCEUR    Imaging: No results found.   Medications:     . antiseptic oral rinse  7 mL Mouth Rinse BID  . calcitRIOL  1.5 mcg Oral Q T,Th,Sa-HD  . calcium acetate  667 mg Oral TID WC  . carvedilol  3.125 mg Oral BID WC  . cinacalcet  90 mg Oral Q breakfast  . darbepoetin (ARANESP) injection - DIALYSIS  100 mcg Intravenous Q Tue-HD  . gabapentin  300 mg Oral QHS  . insulin aspart  0-5 Units Subcutaneous QHS  . insulin aspart  0-9 Units Subcutaneous TID WC  . insulin glargine  10 Units Subcutaneous QHS  . ketoconazole  1 application Topical Daily  . latanoprost  1 drop Left Eye QHS  . multivitamin  1 tablet Oral QHS  . pantoprazole  40 mg Oral  Daily  . polycarbophil  1,250 mg Oral Daily  . polyethylene glycol  17 g Oral Daily  . pravastatin  40 mg Oral Daily  . rOPINIRole  0.5 mg Oral QHS  . saccharomyces boulardii  250 mg Oral BID  . warfarin  5 mg Oral Q48H   And  . warfarin  7.5 mg Oral Q48H  . Warfarin - Pharmacist Dosing Inpatient   Does not apply q1800   sodium chloride, sodium chloride, sodium chloride, acetaminophen, albuterol, alteplase, bisacodyl, diclofenac sodium, docusate sodium, guaiFENesin, heparin, lidocaine (PF), lidocaine-prilocaine, MUSCLE RUB, nitroGLYCERIN, ondansetron, ondansetron, pentafluoroprop-tetrafluoroeth, traMADol  Assessment/ Plan:  [place] TTS 3.5h 2/2 bath P4 75kg Heparin 4000 L thigh AVG Mircera 50 q 2 wks given 11/8  Calcitriol 1.5 ug   ESRD dialysis in TTS  LLE DVT Coumadin INR 2.35  Anemia Darbepoietin 122mcg weekly Hb 9.5   Bones Cinacalcet, 90 mg daily Calcium acetate 667 mg with meals and calcitriol 1.9mcg TTS Last caslium 9.5 Phos 4.5 12/1  Diabetes per primary team started Lantus   Continues in rehab for debility and weakness   Continues with psychiatric support    LOS: 13 Stefanie [name] W @TODAY @10 :58 AM

## 2015-02-21 NOTE — Progress Notes (Addendum)
[place] PHYSICAL MEDICINE & REHABILITATION     PROGRESS NOTE    Subjective/Complaints: Pt states she has been unable to use thumb on R since  ROS: Pt denies , nausea, vomiting, abdominal pain, diarrhea, chest pain, shortness of breath,  Objective: Vital Signs: Blood pressure 123/54, pulse 54, temperature 98.2 F (36.8 C), temperature source Oral, resp. rate 16, weight 78.336 kg (172 lb 11.2 oz), SpO2 100 %. No results found.  Recent Labs  02/19/15 1540  WBC 4.2  HGB 9.5*  HCT 30.0*  PLT 180    Recent Labs  02/19/15 1540  NA 132*  K 4.7  CL 93*  GLUCOSE 185*  BUN 40*  CREATININE 8.91*  CALCIUM 9.5   CBG (last 3)   Recent Labs  02/20/15 1650 02/20/15 2122 02/21/15 0641  GLUCAP 133* 228* 72    Wt Readings from Last 3 Encounters:  02/21/15 78.336 kg (172 lb 11.2 oz)  02/08/15 82.4 kg (181 lb 10.5 oz)  03/23/14 88.451 kg (195 lb)    Physical Exam:  Constitutional: She is oriented to person, place, and time. She appears well-developed and well-nourished.  HENT:  Head: Normocephalic and atraumatic.  Mouth/Throat: Oropharynx is clear and moist.  Eyes: Conjunctivae are normal. Pupils are equal, round, and reactive to light.  Neck: Normal range of motion. Neck supple.  Cardiovascular: Normal rate and regular rhythm.  No murmur heard. Respiratory: Effort normal and breath sounds normal. No respiratory distress. She has no wheezes.  GI: Soft. She exhibits no distension. There is no tenderness.  Musculoskeletal: She exhibits no edema. She exhibits no tenderness.  Left thigh/AVG-- No pain with palpation of LLE. Has some decreased sensation RLE.  Neurological: She is alert and oriented to person, place, and time.  Speech dysarthric (edentulous) and able to follow basic commands without difficulty. Sensory deficits RLE? Does sense pain in foot/ankle area. UE:RUE 3- delt, bi, tri, grip, 5/5 on left side, 4/5 BLE Skin: Skin is warm and dry. No rash noted.  No erythema.  Psychiatric: She has a normal mood and affect. Her behavior is normal. Thought content normal.   Assessment/Plan: 1. Weakness and balance deficits secondary to multiple medical issues/debility which require 3+ hours per day of interdisciplinary therapy in a comprehensive inpatient rehab setting. Physiatrist is providing close team supervision and 24 hour management of active medical problems listed below. Physiatrist and rehab team continue to assess barriers to discharge/monitor patient progress toward functional and medical goals. .Function:  Bathing Bathing position   Position: Shower  Bathing parts Body parts bathed by patient: Right arm, Left arm, Chest, Abdomen, Front perineal area, Right upper leg, Left upper leg, Buttocks, Right lower leg, Left lower leg Body parts bathed by helper: Back  Bathing assist Assist Level: Supervision or verbal cues   Set up : To adjust water temperature, To obtain items  Upper Body Dressing/Undressing Upper body dressing   What is the patient wearing?: Pull over shirt/dress, Bra Bra - Perfomed by patient: Thread/unthread right bra strap, Thread/unthread left bra strap, Hook/unhook bra (pull down sports bra) Bra - Perfomed by helper: Hook/unhook bra (pull down sports bra) Pull over shirt/dress - Perfomed by patient: Thread/unthread right sleeve, Thread/unthread left sleeve, Put head through opening, Pull shirt over trunk          Upper body assist Assist Level: More than reasonable time   Set up : To obtain clothing/put away  Lower Body Dressing/Undressing Lower body dressing   What is the patient  wearing?: Underwear, Pants, Socks, Shoes Underwear - Performed by patient: Thread/unthread right underwear leg, Thread/unthread left underwear leg, Pull underwear up/down Underwear - Performed by helper: Pull underwear up/down Pants- Performed by patient: Thread/unthread right pants leg, Thread/unthread left pants leg, Pull pants  up/down Pants- Performed by helper: Thread/unthread left pants leg, Thread/unthread right pants leg Non-skid slipper socks- Performed by patient: Don/doff right sock, Don/doff left sock Non-skid slipper socks- Performed by helper: Don/doff right sock, Don/doff left sock Socks - Performed by patient: Don/doff right sock, Don/doff left sock   Shoes - Performed by patient: Don/doff right shoe, Don/doff left shoe, Fasten right, Fasten left Shoes - Performed by helper: Don/doff right shoe, Don/doff left shoe          Lower body assist Assist for lower body dressing: Supervision or verbal cues, Set up, More than reasonable time   Set up : To obtain clothing/put away  Toileting Toileting   Toileting steps completed by patient: Performs perineal hygiene, Adjust clothing prior to toileting, Adjust clothing after toileting Toileting steps completed by helper: Adjust clothing after toileting Toileting Assistive Devices: Grab bar or rail  Toileting assist Assist level: Supervision or verbal cues   Transfers Chair/bed transfer   Chair/bed transfer method: Ambulatory Chair/bed transfer assist level: Supervision or verbal cues Chair/bed transfer assistive device: Armrests, Medical sales representative     Max distance: 150 Assist level: Supervision or verbal cues   Wheelchair   Type: Manual Max wheelchair distance: 50 Assist Level: Touching or steadying assistance (Pt > 75%)  Cognition Comprehension Comprehension assist level: Understands complex 90% of the time/cues 10% of the time  Expression Expression assist level: Expresses complex 90% of the time/cues < 10% of the time  Social Interaction Social Interaction assist level: Interacts appropriately with others - No medications needed.  Problem Solving Problem solving assist level: Solves basic 90% of the time/requires cueing < 10% of the time  Memory Memory assist level: Recognizes or recalls 90% of the time/requires cueing < 10%  of the time   Medical Problem List and Plan: 1. 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 LLE DVT Prophylaxis/Anticoagulation: Pharmaceutical: Coumadin therapeutic, d/c SCD Stop plavix since coumadin therapeutic, INR  3. Pain Management: no current c/os 4. Mood: LCSW to follow up for evaluation and support.  5. Neuropsych: This patient is capable of making decisions on her own behalf. 6. Skin/Wound Care: Routine pressure relief measures 7. Fluids/Electrolytes/Nutrition: Continue 1200 cc FR daily.  8. CAD: Treat medically and monitor for symptoms/tolerance with increase in activity. 9. HTN: monitor BP bid on lisinopril and coreg. Adjusting per Nephro 10. ESRD: Appreciate Nephro notes. Continue HD T,Th, Sa in afternoons to help with tolerance of therapy during the day.  -AVG site without further bleeding, no BP issues in HD thus far this week   11. DM type 2: Used novolog bid prn at home. May benefit from scheduled insulin dosing. Will follow for pattern and Continue to monitor BS ac/hs and use SSI for elevated BS.  -HS lantus, , 12/8 72 , reduce dose 12. Anemia of chronic disease: On Aranesp. Monitor and transfuse prn symptoms or Hgb less than 7.0--hgb gradually improving,  9.5 13. Thrombocytopenia: Monitoring closely with heparin on board.180K on 12/6 14. PVD: Off  plavix ,multiple stents left iliac vein. --now on warfarin, INR therapeutic, INR 2.35 15. 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

## 2015-02-22 ENCOUNTER — Inpatient Hospital Stay (HOSPITAL_COMMUNITY): Payer: Medicare Other | Admitting: Physical Therapy

## 2015-02-22 ENCOUNTER — Inpatient Hospital Stay (HOSPITAL_COMMUNITY): Payer: Medicare Other | Admitting: Occupational Therapy

## 2015-02-22 LAB — GLUCOSE, CAPILLARY
GLUCOSE-CAPILLARY: 90 mg/dL (ref 65–99)
GLUCOSE-CAPILLARY: 168 mg/dL — AB (ref 65–99)
GLUCOSE-CAPILLARY: 171 mg/dL — AB (ref 65–99)
Glucose-Capillary: 116 mg/dL — ABNORMAL HIGH (ref 65–99)
Glucose-Capillary: 157 mg/dL — ABNORMAL HIGH (ref 65–99)

## 2015-02-22 LAB — PROTIME-INR
INR: 1.8 — ABNORMAL HIGH (ref 0.00–1.49)
Prothrombin Time: 20.9 seconds — ABNORMAL HIGH (ref 11.6–15.2)

## 2015-02-22 MED ORDER — WARFARIN SODIUM 7.5 MG PO TABS
7.5000 mg | ORAL_TABLET | Freq: Once | ORAL | Status: AC
Start: 2015-02-22 — End: 2015-02-22
  Administered 2015-02-22: 7.5 mg via ORAL
  Filled 2015-02-22: qty 1

## 2015-02-22 NOTE — Progress Notes (Signed)
Physical Therapy Session Note  Patient Details  Name: Stefanie [name] MRN: [id number] Date of Birth: 06/13/1945  Today's Date: 02/22/2015 PT Individual Time: 0800-0857 PT Individual Time Calculation (min): 57 min   Short Term Goals: Week 2:  PT Short Term Goal 1 (Week 2): = LTG due to estimated length of stay  Skilled Therapeutic Interventions/Progress Updates:    Pt received supine in bed, no c/o pain and agreeable to treatment. Upper and lower body dressing performed with set-up assist, close S for standing balance and increased time to perform due to decreased strength, coordination, activity tolerance with dyspnea on exertion. W/c propulsion with BUEs x60' minA due to decreased RUE strength and difficulty with navigation, frequent steering to R side and unable to correct. Gait with rollator [phone] with S, occasional verbal cues for upright posture. Gait speed assessed 0.94 ft/sec, indicating increased falls risk and pt to be a household ambulator with increased risk for hospitalization, dependency in ADLs. Bed transfer with S, and bed mobility with mod I in simulated apartment on flat bed. Pt returned to room and remained seated in w/c with all needs in reach at completion of session.   Therapy Documentation Precautions:  Precautions Precautions: Fall Precaution Comments: history of B CVAs Restrictions Weight Bearing Restrictions: No Pain: Pain Assessment Pain Assessment: No/denies pain Pain Score: 0-No pain   See Function Navigator for Current Functional Status.   Therapy/Group: Individual Therapy  Luberta Mutter 02/22/2015, 9:00 AM

## 2015-02-22 NOTE — Progress Notes (Signed)
[facility] ROUNDING NOTE   Subjective:   Interval History:  No complaints today  Objective:  Vital signs in last 24 hours:  Temp:  [97.6 F (36.4 C)-98.7 F (37.1 C)] 97.6 F (36.4 C) (12/09 1416) Pulse Rate:  [56-75] 70 (12/09 1416) Resp:  [18-22] 20 (12/09 1416) BP: (129-162)/(42-78) 155/51 mmHg (12/09 1416) SpO2:  [97 %-100 %] 98 % (12/09 1416) Weight:  [76.476 kg (168 lb 9.6 oz)-80.9 kg (178 lb 5.6 oz)] 76.476 kg (168 lb 9.6 oz) (12/09 0542)  Weight change: 5.064 kg (11 lb 2.6 oz) Filed Weights   02/21/15 1524 02/21/15 1915 02/22/15 0542  Weight: 83.4 kg (183 lb 13.8 oz) 80.9 kg (178 lb 5.6 oz) 76.476 kg (168 lb 9.6 oz)    Intake/Output: I/O last 3 completed shifts: In: 480 [P.O.:480] Out: 2993 [Other:2993]   Intake/Output this shift:  Total I/O In: 480 [P.O.:480] Out: -   CVS- RRR RS- CTA ABD- BS present soft non-distended Thigh AVG EXT- Trace edema    Basic Metabolic Panel:  Recent Labs Lab 02/19/15 1540 02/21/15 1545  NA 132* 133*  K 4.7 4.7  CL 93* 96*  CO2 27 26  GLUCOSE 185* 141*  BUN 40* 44*  CREATININE 8.91* 8.22*  CALCIUM 9.5 9.2  PHOS 4.5 4.1    Liver Function Tests:  Recent Labs Lab 02/19/15 1540 02/21/15 1545  ALBUMIN 3.4* 3.1*   No results for input(s): LIPASE, AMYLASE in the last 168 hours. No results for input(s): AMMONIA in the last 168 hours.  CBC:  Recent Labs Lab 02/19/15 1540 02/21/15 1545  WBC 4.2 4.1  HGB 9.5* 9.0*  HCT 30.0* 27.6*  MCV 100.3* 100.7*  PLT 180 152    Cardiac Enzymes: No results for input(s): CKTOTAL, CKMB, CKMBINDEX, TROPONINI in the last 168 hours.  BNP: Invalid input(s): POCBNP  CBG:  Recent Labs Lab 02/21/15 0641 02/21/15 1120 02/21/15 2103 02/22/15 0646 02/22/15 1121  GLUCAP 72 91 186* 116* 66    Microbiology: Results for orders placed or performed during the hospital encounter of 11/13/10  Surgical pcr screen     Status: None   Collection Time:  11/13/10  8:44 AM  Result Value Ref Range Status   MRSA, PCR NEGATIVE NEGATIVE Final   Staphylococcus aureus NEGATIVE NEGATIVE Final    Comment:        The Xpert SA Assay (FDA approved for NASAL specimens only), is one component of a comprehensive surveillance program.  It is not intended to diagnose infection nor to guide or monitor treatment.  Culture, blood (routine x 2)     Status: None   Collection Time: 11/14/10  2:40 AM  Result Value Ref Range Status   Specimen Description BLOOD LEFT HAND  Final   Special Requests BOTTLES DRAWN AEROBIC ONLY 5CC  Final   Culture  Setup Time [id number]  Final   Culture NO GROWTH 5 DAYS  Final   Report Status 11/20/2010 FINAL  Final  Culture, blood (routine x 2)     Status: None   Collection Time: 11/14/10  2:50 AM  Result Value Ref Range Status   Specimen Description BLOOD LEFT ARM  Final   Special Requests BOTTLES DRAWN AEROBIC ONLY 5CC  Final   Culture  Setup Time [id number]  Final   Culture NO GROWTH 5 DAYS  Final   Report Status 11/20/2010 FINAL  Final    Coagulation Studies:  Recent Labs  02/20/15 0525 02/22/15 0902  LABPROT 25.5* 20.9*  INR 2.35* 1.80*    Urinalysis: No results for input(s): COLORURINE, LABSPEC, PHURINE, GLUCOSEU, HGBUR, BILIRUBINUR, KETONESUR, PROTEINUR, UROBILINOGEN, NITRITE, LEUKOCYTESUR in the last 72 hours.  Invalid input(s): APPERANCEUR    Imaging: No results found.   Medications:     . antiseptic oral rinse  7 mL Mouth Rinse BID  . calcitRIOL  1.5 mcg Oral Q T,Th,Sa-HD  . calcium acetate  667 mg Oral TID WC  . carvedilol  3.125 mg Oral BID WC  . cinacalcet  90 mg Oral Q breakfast  . darbepoetin (ARANESP) injection - DIALYSIS  100 mcg Intravenous Q Tue-HD  . gabapentin  300 mg Oral QHS  . insulin aspart  0-5 Units Subcutaneous QHS  . insulin aspart  0-9 Units Subcutaneous TID WC  . insulin glargine  10 Units Subcutaneous QHS  . ketoconazole  1 application Topical Daily  .  latanoprost  1 drop Left Eye QHS  . multivitamin  1 tablet Oral QHS  . pantoprazole  40 mg Oral Daily  . polycarbophil  1,250 mg Oral Daily  . polyethylene glycol  17 g Oral Daily  . pravastatin  40 mg Oral Daily  . rOPINIRole  0.5 mg Oral QHS  . saccharomyces boulardii  250 mg Oral BID  . warfarin  7.5 mg Oral ONCE-1800  . Warfarin - Pharmacist Dosing Inpatient   Does not apply q1800   sodium chloride, sodium chloride, sodium chloride, acetaminophen, albuterol, alteplase, bisacodyl, diclofenac sodium, docusate sodium, guaiFENesin, heparin, lidocaine (PF), lidocaine-prilocaine, MUSCLE RUB, nitroGLYCERIN, ondansetron, ondansetron, pentafluoroprop-tetrafluoroeth, traMADol  Assessment/ Plan:  [place] TTS 3.5h 2/2 bath P4 75kg Heparin 4000 L thigh AVG Mircera 50 q 2 wks given 11/8  Calcitriol 1.5 ug   ESRD dialysis in TTS  LLE DVT Coumadin INR 1.8  Anemia Darbepoietin 129mcg weekly Hb 9.0  Bones Cinacalcet, 90 mg daily Calcium acetate 667 mg with meals and calcitriol 1.29mcg TTS Last caslium 9.2  Phosphorus  4.1  Diabetes per primary team started Lantus   Continues in rehab for debility and weakness   Continues with psychiatric support   LOS: 14 Corri Delapaz W @TODAY @4 :34 PM

## 2015-02-22 NOTE — Progress Notes (Signed)
Social Work Patient ID: Stefanie [name], female   DOB: 10-06-1945, 69 y.o.   MRN: [id number] Spoke with daughter can change HD schedule to M, W ,F next week. Will need to stay and receive HD Sat and ques partial on Monday-[name] to find out from HD And then transfer once completed to get on M, W,F schedule. Awaiting confirmation from the facility regarding this. Made pt and team aware of this plan-pt agreeable. Will work on dialysis schedule for smooth transition.

## 2015-02-22 NOTE — Progress Notes (Signed)
Social Work Patient ID: Stefanie [name], female   DOB: 08/23/1945, 69 y.o.   MRN: [id number] Finally spoke with [name] to discuss transfer of pt. Will have to get HD schedule changed which will take until Monday. So Saturday pt to do HD here and [name] finding out if need to Monday am, before  Transfer to [facility]. Pt and daughter aware of this plan and agreeable.

## 2015-02-22 NOTE — Progress Notes (Signed)
Occupational Therapy Session Note  Patient Details  Name: [name] Pacifico MRN: [id number] Date of Birth: 05-04-1945  Today's Date: 02/22/2015 OT Individual Time: 1320-1400 OT Individual Time Calculation (min): 40 min    Short Term Goals: Week 1:  OT Short Term Goal 1 (Week 1): Pt will perform LB bathing with supervision shower level sit to stand. OT Short Term Goal 1 - Progress (Week 1): Progressing toward goal OT Short Term Goal 2 (Week 1): Pt will perform LB dressing with supervision sit to stand.  OT Short Term Goal 2 - Progress (Week 1): Progressing toward goal OT Short Term Goal 3 (Week 1): Pt will perform toilet transfers and toileting with supervision sit to stand.  OT Short Term Goal 3 - Progress (Week 1): Progressing toward goal OT Short Term Goal 4 (Week 1): Pt will perform simple snack prep with RW and close supervision.  OT Short Term Goal 4 - Progress (Week 1): Progressing toward goal Week 2:  OT Short Term Goal 1 (Week 2): Pt will perform LB bathing with supervision shower level sit to stand. OT Short Term Goal 2 (Week 2): Pt will perform LB dressing with supervision sit to stand.  OT Short Term Goal 3 (Week 2): Pt will perform toilet transfers and toileting with supervision sit to stand.  OT Short Term Goal 4 (Week 2): Pt will perform simple snack prep with RW and close supervision.   Skilled Therapeutic Interventions/Progress Updates:    1:1 Therapeutic activity with focus on transfers (squat pivot ans stand pivot transfers). Transitioned into to the gym and focus on sit to stands and standing balance and stnading during functional game.   Therapy Documentation Precautions:  Precautions Precautions: Fall Precaution Comments: history of B CVAs Restrictions Weight Bearing Restrictions: No General: General OT Amount of Missed Time: 20 Minutes due to lunch being late and wanting to wait to eat before therapy Pain: Pain Assessment Pain Assessment: 0-10 Pain Score: 5   Pain Type: Chronic pain Pain Location: Knee Pain Orientation: Right Pain Descriptors / Indicators: Aching;Sore Pain Onset: On-going Patients Stated Pain Goal: 0 Pain Intervention(s): RN made aware Multiple Pain Sites: No  See Function Navigator for Current Functional Status.   Therapy/Group: Individual Therapy  Willeen Cass [facility] 02/22/2015, 3:50 PM

## 2015-02-22 NOTE — Progress Notes (Signed)
ANTICOAGULATION CONSULT NOTE - Follow Up Consult  Pharmacy Consult for coumadin Indication: DVT  No Known Allergies  Patient Measurements: Weight: 168 lb 9.6 oz (76.476 kg) Heparin Dosing Weight:   Vital Signs: Temp: 98.4 F (36.9 C) (12/09 0542) Temp Source: Oral (12/09 0542) BP: 158/64 mmHg (12/09 0734) Pulse Rate: 64 (12/09 0734)  Labs:  Recent Labs  02/19/15 1540 02/20/15 0525 02/21/15 1545  HGB 9.5*  --  9.0*  HCT 30.0*  --  27.6*  PLT 180  --  152  LABPROT  --  25.5*  --   INR  --  2.35*  --   CREATININE 8.91*  --  8.22*    Estimated Creatinine Clearance: 6.5 mL/min (by C-G formula based on Cr of 8.22).   Medications:  Scheduled:  . antiseptic oral rinse  7 mL Mouth Rinse BID  . calcitRIOL  1.5 mcg Oral Q T,Th,Sa-HD  . calcium acetate  667 mg Oral TID WC  . carvedilol  3.125 mg Oral BID WC  . cinacalcet  90 mg Oral Q breakfast  . darbepoetin (ARANESP) injection - DIALYSIS  100 mcg Intravenous Q Tue-HD  . gabapentin  300 mg Oral QHS  . insulin aspart  0-5 Units Subcutaneous QHS  . insulin aspart  0-9 Units Subcutaneous TID WC  . insulin glargine  10 Units Subcutaneous QHS  . ketoconazole  1 application Topical Daily  . latanoprost  1 drop Left Eye QHS  . multivitamin  1 tablet Oral QHS  . pantoprazole  40 mg Oral Daily  . polycarbophil  1,250 mg Oral Daily  . polyethylene glycol  17 g Oral Daily  . pravastatin  40 mg Oral Daily  . rOPINIRole  0.5 mg Oral QHS  . saccharomyces boulardii  250 mg Oral BID  . warfarin  5 mg Oral Q48H   And  . warfarin  7.5 mg Oral Q48H  . Warfarin - Pharmacist Dosing Inpatient   Does not apply q1800   Infusions:    Assessment: 69 yo female with DVT is currently on subtherpeutic coumadin.  INR today is 1.8.  Goal of Therapy:  INR 2-3 Monitor platelets by anticoagulation protocol: Yes   Plan:  Change coumadin to 7.5 mg po x1 and can probably go back to regular dosing tomorrow Change INR to daily for now  Johnross Nabozny,  Tsz-Yin 02/22/2015,8:27 AM

## 2015-02-22 NOTE — Progress Notes (Signed)
Social Work Patient ID: Stefanie [name], female   DOB: May 16, 1945, 69 y.o.   MRN: [id number] Message left for admission coordinator at [facility] awaiting return call.

## 2015-02-22 NOTE — Progress Notes (Addendum)
[facility] PHYSICAL MEDICINE & REHABILITATION     PROGRESS NOTE    Subjective/Complaints: No issues overnite, discussed SNF, HD Has some left ankle pain, no trauma  ROS: Pt denies , nausea, vomiting, abdominal pain, diarrhea, chest pain, shortness of breath,  Objective: Vital Signs: Blood pressure 133/64, pulse 57, temperature 98.4 F (36.9 C), temperature source Oral, resp. rate 18, weight 76.476 kg (168 lb 9.6 oz), SpO2 97 %. No results found.  Recent Labs  02/19/15 1540 02/21/15 1545  WBC 4.2 4.1  HGB 9.5* 9.0*  HCT 30.0* 27.6*  PLT 180 152    Recent Labs  02/19/15 1540 02/21/15 1545  NA 132* 133*  K 4.7 4.7  CL 93* 96*  GLUCOSE 185* 141*  BUN 40* 44*  CREATININE 8.91* 8.22*  CALCIUM 9.5 9.2   CBG (last 3)   Recent Labs  02/21/15 1120 02/21/15 2103 02/22/15 0646  GLUCAP 91 186* 116*    Wt Readings from Last 3 Encounters:  02/22/15 76.476 kg (168 lb 9.6 oz)  02/08/15 82.4 kg (181 lb 10.5 oz)  03/23/14 88.451 kg (195 lb)    Physical Exam:  Constitutional: She is oriented to person, place, and time. She appears well-developed and well-nourished.  HENT:  Head: Normocephalic and atraumatic.  Mouth/Throat: Oropharynx is clear and moist.  Eyes: Conjunctivae are normal. Pupils are equal, round, and reactive to light.  Neck: Normal range of motion. Neck supple.  Cardiovascular: Normal rate and regular rhythm.  No murmur heard. Respiratory: Effort normal and breath sounds normal. No respiratory distress. She has no wheezes.  GI: Soft. She exhibits no distension. There is no tenderness.  Musculoskeletal: She exhibits no edema. She exhibits no tenderness.  Left thigh/AVG-- No pain with palpation of graft area, good bruit, Left ankle without swelling, no pain to palpation or with ROM Neurological: She is alert and oriented to person, place, and time.  Speech dysarthric (edentulous) and able to follow basic commands without difficulty. Sensory deficits  RLE? Does sense pain in foot/ankle area. UE:RUE 3- delt, bi, tri, grip, 5/5 on left side, 4/5 BLE Skin: Skin is warm and dry. No rash noted. No erythema.  Psychiatric: She has a normal mood and affect. Her behavior is normal. Thought content normal.   Assessment/Plan: 1. Weakness and balance deficits secondary to multiple medical issues/debility which require 3+ hours per day of interdisciplinary therapy in a comprehensive inpatient rehab setting. Physiatrist is providing close team supervision and 24 hour management of active medical problems listed below. Physiatrist and rehab team continue to assess barriers to discharge/monitor patient progress toward functional and medical goals.  Schedule for Botox in my office in 1 mo 100 units .Function:  Bathing Bathing position   Position: Shower  Bathing parts Body parts bathed by patient: Right arm, Left arm, Chest, Abdomen, Front perineal area, Right upper leg, Left upper leg, Buttocks, Right lower leg, Left lower leg Body parts bathed by helper: Back  Bathing assist Assist Level: Supervision or verbal cues   Set up : To adjust water temperature, To obtain items  Upper Body Dressing/Undressing Upper body dressing   What is the patient wearing?: Pull over shirt/dress, Bra Bra - Perfomed by patient: Thread/unthread right bra strap, Thread/unthread left bra strap, Hook/unhook bra (pull down sports bra) Bra - Perfomed by helper: Hook/unhook bra (pull down sports bra) Pull over shirt/dress - Perfomed by patient: Thread/unthread right sleeve, Thread/unthread left sleeve, Put head through opening, Pull shirt over trunk  Upper body assist Assist Level: More than reasonable time   Set up : To obtain clothing/put away  Lower Body Dressing/Undressing Lower body dressing   What is the patient wearing?: Underwear, Pants, Socks, Shoes Underwear - Performed by patient: Thread/unthread right underwear leg, Thread/unthread left underwear leg,  Pull underwear up/down Underwear - Performed by helper: Pull underwear up/down Pants- Performed by patient: Thread/unthread right pants leg, Thread/unthread left pants leg, Pull pants up/down Pants- Performed by helper: Thread/unthread left pants leg, Thread/unthread right pants leg Non-skid slipper socks- Performed by patient: Don/doff right sock, Don/doff left sock Non-skid slipper socks- Performed by helper: Don/doff right sock, Don/doff left sock Socks - Performed by patient: Don/doff right sock, Don/doff left sock   Shoes - Performed by patient: Don/doff right shoe, Don/doff left shoe Shoes - Performed by helper: Don/doff right shoe, Don/doff left shoe          Lower body assist Assist for lower body dressing: Supervision or verbal cues, Set up, More than reasonable time   Set up : To obtain clothing/put away  Toileting Toileting   Toileting steps completed by patient: Performs perineal hygiene, Adjust clothing prior to toileting, Adjust clothing after toileting Toileting steps completed by helper: Adjust clothing after toileting Toileting Assistive Devices: Grab bar or rail  Toileting assist Assist level: Supervision or verbal cues   Transfers Chair/bed transfer   Chair/bed transfer method: Ambulatory Chair/bed transfer assist level: Supervision or verbal cues Chair/bed transfer assistive device: Armrests, Medical sales representative     Max distance: 150 Assist level: Supervision or verbal cues   Wheelchair   Type: Manual Max wheelchair distance: 50 Assist Level: Touching or steadying assistance (Pt > 75%)  Cognition Comprehension Comprehension assist level: Understands complex 90% of the time/cues 10% of the time  Expression Expression assist level: Expresses complex 90% of the time/cues < 10% of the time  Social Interaction Social Interaction assist level: Interacts appropriately with others - No medications needed.  Problem Solving Problem solving assist  level: Solves basic 90% of the time/requires cueing < 10% of the time  Memory Memory assist level: Recognizes or recalls 90% of the time/requires cueing < 10% of the time   Medical Problem List and Plan: 1. 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 LLE DVT Prophylaxis/Anticoagulation: Pharmaceutical: Coumadin therapeutic, d/c SCD Stop plavix since coumadin therapeutic, INR  3. Pain Management: ankle pain on left likely due to DVT 4. Mood: LCSW to follow up for evaluation and support.  5. Neuropsych: This patient is capable of making decisions on her own behalf. 6. Skin/Wound Care: Routine pressure relief measures 7. Fluids/Electrolytes/Nutrition: Continue 1200 cc FR daily.  8. CAD: Treat medically and monitor for symptoms/tolerance with increase in activity. 9. HTN: monitor BP bid on lisinopril and coreg. Adjusting per Nephro 10. ESRD: Appreciate Nephro notes. Continue HD T,Th, Sa in afternoons to help with tolerance of therapy during the day.  -AVG site without further bleeding, good flow   11. DM type 2: Used novolog bid prn at home. May benefit from scheduled insulin dosing. Will follow for pattern and Continue to monitor BS ac/hs and use SSI for elevated BS.  -HS lantus, , 12/8 72 , reduce dose- 116 today 12. Anemia of chronic disease: On Aranesp.  Monitor and transfuse prn symptoms or Hgb less than 7.0--hgb gradually improving,  9.5 13. Thrombocytopenia: Monitoring closely with heparin on board.180K on 12/6 14. PVD: Off plavix ,multiple stents left iliac vein. --now on warfarin, INR therapeutic, INR 2.35 on 12/7 , 12/9 pending 15. 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

## 2015-02-22 NOTE — Progress Notes (Signed)
Physical Therapy Session Note  Patient Details  Name: Stefanie [name] MRN: [id number] Date of Birth: 1945-05-09  Today's Date: 02/22/2015 PT Individual Time: 1430-1530 PT Individual Time Calculation (min): 60 min   Short Term Goals: Week 2:  PT Short Term Goal 1 (Week 2): = LTG due to estimated length of stay  Skilled Therapeutic Interventions/Progress Updates:    Pt received seated in w/c, c/o knee pain as below and RN alerted. Standing x4 trials of 4-5 min each with occasional UE support on table while playing card game. Requires seated rest breaks due to fatigue and R knee pain. Pt requests seated activity due to increasing R knee pain and RN not yet arrived with pain relief cream. Engaged pt with pipe tree for BUE coordination, seated posture and tolerance, problem solving. Requires min verbal cues for problem solving with various size pipes to create image on picture. Returned pt to room, remained seated with ice on R knee, all needs in reach at completion of session.   Therapy Documentation Precautions:  Precautions Precautions: Fall Precaution Comments: history of B CVAs Restrictions Weight Bearing Restrictions: No Pain: Pain Assessment Pain Assessment: 0-10 Pain Score: 5  Pain Type: Chronic pain Pain Location: Knee Pain Orientation: Right Pain Descriptors / Indicators: Aching;Sore Pain Onset: On-going Patients Stated Pain Goal: 0 Pain Intervention(s): RN made aware Multiple Pain Sites: No   See Function Navigator for Current Functional Status.   Therapy/Group: Individual Therapy  Luberta Mutter 02/22/2015, 3:33 PM

## 2015-02-22 NOTE — Progress Notes (Signed)
Occupational Therapy Session Note  Patient Details  Name: Stefanie [name] MRN: [id number] Date of Birth: Dec 16, 1945  Today's Date: 02/22/2015 OT Individual Time: 1000-1100 OT Individual Time Calculation (min): 60 min    Short Term Goals: Week 2:  OT Short Term Goal 1 (Week 2): Pt will perform LB bathing with supervision shower level sit to stand. OT Short Term Goal 2 (Week 2): Pt will perform LB dressing with supervision sit to stand.  OT Short Term Goal 3 (Week 2): Pt will perform toilet transfers and toileting with supervision sit to stand.  OT Short Term Goal 4 (Week 2): Pt will perform simple snack prep with RW and close supervision.   Skilled Therapeutic Interventions/Progress Updates:    Pt seen for OT therapy session focusing on functional mobility, functional standing balance, and activity tolerance. Pt in w/c upon arrival, declining bathing/dressing session, however, agreeable to tx session. She ambulated throughout unit using rollator with supervision and  VCs for upright standing posture. In therapy gym, pt completed standing clothes pin tree with emphasis on functional standing balance (required to stand without UE support), reaching across midline and overhead to place pins. CGA provided with cues to maintain erect posture. Pt required seated rest break following ~1-2 minutes of standing task.   Pt completed toileting task in ADL apartment. She required assist to stand from low surface toilet with VCs provided for sit <> stand technique and weight shift. Pt ambulated back to room and desired to change LB clothing, pants and shoes donned/doffed with supervision. She required VCs throughout session for use of brakes when using rollator during sit <> stands.  Therapy Documentation Precautions:  Precautions Precautions: Fall Precaution Comments: history of B CVAs Restrictions Weight Bearing Restrictions: No Pain: Pain Assessment Pain Assessment: No/denies pain Pain Score: 0-No  pain  See Function Navigator for Current Functional Status.   Therapy/Group: Individual Therapy  Lewis, Wilkie Zenon C 02/22/2015, 12:05 PM

## 2015-02-23 ENCOUNTER — Inpatient Hospital Stay (HOSPITAL_COMMUNITY): Payer: Medicare Other | Admitting: Physical Therapy

## 2015-02-23 LAB — CBC
HCT: 30.5 % — ABNORMAL LOW (ref 36.0–46.0)
Hemoglobin: 9.9 g/dL — ABNORMAL LOW (ref 12.0–15.0)
MCH: 33.1 pg (ref 26.0–34.0)
MCHC: 32.5 g/dL (ref 30.0–36.0)
MCV: 102 fL — ABNORMAL HIGH (ref 78.0–100.0)
Platelets: 171 K/uL (ref 150–400)
RBC: 2.99 MIL/uL — ABNORMAL LOW (ref 3.87–5.11)
RDW: 18.2 % — ABNORMAL HIGH (ref 11.5–15.5)
WBC: 3.4 K/uL — ABNORMAL LOW (ref 4.0–10.5)

## 2015-02-23 LAB — GLUCOSE, CAPILLARY
GLUCOSE-CAPILLARY: 129 mg/dL — AB (ref 65–99)
Glucose-Capillary: 98 mg/dL (ref 65–99)
Glucose-Capillary: 128 mg/dL — ABNORMAL HIGH (ref 65–99)
Glucose-Capillary: 190 mg/dL — ABNORMAL HIGH (ref 65–99)

## 2015-02-23 LAB — PROTIME-INR
INR: 2.15 — AB (ref 0.00–1.49)
Prothrombin Time: 23.8 seconds — ABNORMAL HIGH (ref 11.6–15.2)

## 2015-02-23 MED ORDER — SODIUM CHLORIDE 0.9 % IV SOLN: 100.0000 mL | INTRAVENOUS | Status: DC | PRN

## 2015-02-23 MED ORDER — HEPARIN SODIUM (PORCINE) 1000 UNIT/ML DIALYSIS: 1000.0000 [IU] | INTRAMUSCULAR | Status: DC | PRN

## 2015-02-23 MED ORDER — PENTAFLUOROPROP-TETRAFLUOROETH EX AERO: 1.0000 "application " | INHALATION_SPRAY | CUTANEOUS | Status: DC | PRN

## 2015-02-23 MED ORDER — WARFARIN SODIUM 7.5 MG PO TABS
7.5000 mg | ORAL_TABLET | Freq: Once | ORAL | Status: AC
  Administered 2015-02-23: 7.5 mg via ORAL
  Filled 2015-02-23: qty 1

## 2015-02-23 MED ORDER — LIDOCAINE HCL (PF) 1 % IJ SOLN: 5.0000 mL | INTRAMUSCULAR | Status: DC | PRN

## 2015-02-23 MED ORDER — ALTEPLASE 2 MG IJ SOLR: 2.0000 mg | Freq: Once | INTRAMUSCULAR | Status: DC | PRN

## 2015-02-23 MED ORDER — LIDOCAINE-PRILOCAINE 2.5-2.5 % EX CREA: 1.0000 "application " | TOPICAL_CREAM | CUTANEOUS | Status: DC | PRN

## 2015-02-23 MED ORDER — HEPARIN SODIUM (PORCINE) 1000 UNIT/ML DIALYSIS: 4000.0000 [IU] | Freq: Once | INTRAMUSCULAR | Status: DC

## 2015-02-23 MED ORDER — HEPARIN SODIUM (PORCINE) 1000 UNIT/ML DIALYSIS: 2000.0000 [IU] | INTRAMUSCULAR | Status: DC | PRN

## 2015-02-23 NOTE — Progress Notes (Signed)
ANTICOAGULATION CONSULT NOTE - Follow Up Consult  Pharmacy Consult for Warfarin Indication: DVT  No Known Allergies  Patient Measurements: Weight: 173 lb 8 oz (78.699 kg)   Vital Signs: Temp: 98.3 F (36.8 C) (12/10 0621) Temp Source: Oral (12/10 0621) BP: 134/73 mmHg (12/10 0621) Pulse Rate: 57 (12/10 0621)  Labs:  Recent Labs  02/21/15 1545 02/22/15 0902 02/23/15 0546  HGB 9.0*  --   --   HCT 27.6*  --   --   PLT 152  --   --   LABPROT  --  20.9* 23.8*  INR  --  1.80* 2.15*  CREATININE 8.22*  --   --     Estimated Creatinine Clearance: 6.6 mL/min (by C-G formula based on Cr of 8.22).   Medications:  Scheduled:  . antiseptic oral rinse  7 mL Mouth Rinse BID  . calcitRIOL  1.5 mcg Oral Q T,Th,Sa-HD  . calcium acetate  667 mg Oral TID WC  . carvedilol  3.125 mg Oral BID WC  . cinacalcet  90 mg Oral Q breakfast  . darbepoetin (ARANESP) injection - DIALYSIS  100 mcg Intravenous Q Tue-HD  . gabapentin  300 mg Oral QHS  . insulin aspart  0-5 Units Subcutaneous QHS  . insulin aspart  0-9 Units Subcutaneous TID WC  . insulin glargine  10 Units Subcutaneous QHS  . ketoconazole  1 application Topical Daily  . latanoprost  1 drop Left Eye QHS  . multivitamin  1 tablet Oral QHS  . pantoprazole  40 mg Oral Daily  . polycarbophil  1,250 mg Oral Daily  . polyethylene glycol  17 g Oral Daily  . pravastatin  40 mg Oral Daily  . rOPINIRole  0.5 mg Oral QHS  . saccharomyces boulardii  250 mg Oral BID  . Warfarin - Pharmacist Dosing Inpatient   Does not apply q1800   Infusions:   PRN: sodium chloride, sodium chloride, sodium chloride, acetaminophen, albuterol, alteplase, bisacodyl, diclofenac sodium, docusate sodium, guaiFENesin, heparin, lidocaine (PF), lidocaine-prilocaine, MUSCLE RUB, nitroGLYCERIN, ondansetron, ondansetron, pentafluoroprop-tetrafluoroeth, traMADol  Assessment: 70 YOF on coumadin for DVT.  INR now therapeutic at 2.15 after subtherapeutic INR yesterday.   Will continue 7.5/5 mg alternating every day warfarin regimen.   No s/sx of bleeding noted. Hgb 9, plts 152   Goal of Therapy:  INR 2-3 Monitor platelets by anticoagulation protocol: Yes   Plan:  Warfarin 7.5 mg x1 (Continue 7.5/5 mg alternating every day warfarin regimen) Monitor for s/sx of bleeding Daily INR, CBC Q3D  Kelsy E Combs 02/23/2015,10:08 AM

## 2015-02-23 NOTE — Progress Notes (Signed)
[facility] PHYSICAL MEDICINE & REHABILITATION     PROGRESS NOTE    Subjective/Complaints: D/C held due to changes in HD schedule No ankle pain today  ROS: Pt denies , nausea, vomiting, abdominal pain, diarrhea, chest pain, shortness of breath,  Objective: Vital Signs: Blood pressure 134/73, pulse 57, temperature 98.3 F (36.8 C), temperature source Oral, resp. rate 18, weight 78.699 kg (173 lb 8 oz), SpO2 97 %. No results found.  Recent Labs  02/21/15 1545  WBC 4.1  HGB 9.0*  HCT 27.6*  PLT 152    Recent Labs  02/21/15 1545  NA 133*  K 4.7  CL 96*  GLUCOSE 141*  BUN 44*  CREATININE 8.22*  CALCIUM 9.2   CBG (last 3)   Recent Labs  02/22/15 1628 02/22/15 2123 02/23/15 0701  GLUCAP 168* 157* 129*    Wt Readings from Last 3 Encounters:  02/23/15 78.699 kg (173 lb 8 oz)  02/08/15 82.4 kg (181 lb 10.5 oz)  03/23/14 88.451 kg (195 lb)    Physical Exam:  Constitutional: She is oriented to person, place, and time. She appears well-developed and well-nourished.  HENT:  Head: Normocephalic and atraumatic.   Eyes: Conjunctivae are normal. Pupils are equal, round, and reactive to light.  Neck: Normal range of motion. Neck supple.  Cardiovascular: Normal rate and regular rhythm.  No murmur heard. Respiratory: Effort normal and breath sounds normal. No respiratory distress. She has no wheezes.  GI: Soft. She exhibits no distension. There is no tenderness.   Neurological: She is alert and oriented to person, place, and time.  Speech dysarthric (edentulous) and able to follow basic commands without difficulty.   Psychiatric: She has a normal mood and affect. Her behavior is normal. Thought content normal.   Assessment/Plan: 1. Weakness and balance deficits secondary to multiple medical issues/debility which require 3+ hours per day of interdisciplinary therapy in a comprehensive inpatient rehab setting. Physiatrist is providing close team supervision and  24 hour management of active medical problems listed below. Physiatrist and rehab team continue to assess barriers to discharge/monitor patient progress toward functional and medical goals.  Will plan d/c to SNF on Monday, HD today and Mon .Function:  Bathing Bathing position   Position: Shower  Bathing parts Body parts bathed by patient: Right arm, Left arm, Chest, Abdomen, Front perineal area, Right upper leg, Left upper leg, Buttocks, Right lower leg, Left lower leg Body parts bathed by helper: Back  Bathing assist Assist Level: Supervision or verbal cues   Set up : To adjust water temperature, To obtain items  Upper Body Dressing/Undressing Upper body dressing   What is the patient wearing?: Pull over shirt/dress, Bra Bra - Perfomed by patient: Thread/unthread right bra strap, Thread/unthread left bra strap, Hook/unhook bra (pull down sports bra) Bra - Perfomed by helper: Hook/unhook bra (pull down sports bra) Pull over shirt/dress - Perfomed by patient: Thread/unthread right sleeve, Thread/unthread left sleeve, Put head through opening, Pull shirt over trunk          Upper body assist Assist Level: More than reasonable time   Set up : To obtain clothing/put away  Lower Body Dressing/Undressing Lower body dressing   What is the patient wearing?: Pants, Shoes Underwear - Performed by patient: Thread/unthread right underwear leg, Thread/unthread left underwear leg, Pull underwear up/down Underwear - Performed by helper: Pull underwear up/down Pants- Performed by patient: Thread/unthread right pants leg, Thread/unthread left pants leg, Pull pants up/down Pants- Performed by helper: Thread/unthread left  pants leg, Thread/unthread right pants leg Non-skid slipper socks- Performed by patient: Don/doff right sock, Don/doff left sock Non-skid slipper socks- Performed by helper: Don/doff right sock, Don/doff left sock Socks - Performed by patient: Don/doff right sock, Don/doff left  sock   Shoes - Performed by patient: Don/doff right shoe, Don/doff left shoe, Fasten right, Fasten left Shoes - Performed by helper: Don/doff right shoe, Don/doff left shoe          Lower body assist Assist for lower body dressing: Supervision or verbal cues, Set up, More than reasonable time   Set up : To obtain clothing/put away  Toileting Toileting   Toileting steps completed by patient: Performs perineal hygiene, Adjust clothing prior to toileting, Adjust clothing after toileting Toileting steps completed by helper: Adjust clothing after toileting Toileting Assistive Devices: Grab bar or rail  Toileting assist Assist level: Touching or steadying assistance (Pt.75%)   Transfers Chair/bed transfer   Chair/bed transfer method: Ambulatory Chair/bed transfer assist level: Supervision or verbal cues Chair/bed transfer assistive device: Armrests, Medical sales representative     Max distance: 190 Assist level: Supervision or verbal cues   Wheelchair   Type: Manual Max wheelchair distance: 60 Assist Level: Touching or steadying assistance (Pt > 75%)  Cognition Comprehension Comprehension assist level: Understands complex 90% of the time/cues 10% of the time  Expression Expression assist level: Expresses complex 90% of the time/cues < 10% of the time  Social Interaction Social Interaction assist level: Interacts appropriately with others - No medications needed.  Problem Solving Problem solving assist level: Solves basic 90% of the time/requires cueing < 10% of the time  Memory Memory assist level: Recognizes or recalls 90% of the time/requires cueing < 10% of the time   Medical Problem List and Plan: 1. 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 LLE DVT Prophylaxis/Anticoagulation: Pharmaceutical: Coumadin therapeutic, d/c SCD Stop plavix since coumadin  therapeutic, INR  3. Pain Management: ankle pain on left likely due to DVT 4. Mood: LCSW to follow up for evaluation and support.  5. Neuropsych: This patient is capable of making decisions on her own behalf. 6. Skin/Wound Care: Routine pressure relief measures 7. Fluids/Electrolytes/Nutrition: Continue 1200 cc FR daily.  8. CAD: Treat medically and monitor for symptoms/tolerance with increase in activity. 9. HTN: monitor BP bid on lisinopril and coreg. Adjusting per Nephro 10. ESRD: Appreciate Nephro notes. Continue HD T,Th, Sa in afternoons to help with tolerance of therapy during the day.  -AVG site without further bleeding, good flow   11. DM type 2: Used novolog bid prn at home. May benefit from scheduled insulin dosing. Will follow for pattern and Continue to monitor BS ac/hs and use SSI for elevated BS.  -HS lantus, , 12/8 72 , reduce dose- 116 today 12. Anemia of chronic disease: On Aranesp. Monitor and transfuse prn symptoms or Hgb less than 7.0--hgb gradually improving,  9.5 13. Thrombocytopenia: Monitoring closely with heparin on board.180K on 12/6 14. PVD: Off plavix ,multiple stents left iliac vein. --now on warfarin, INR therapeutic, INR 2.35 on 12/7 , 12/9 pending 15. 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

## 2015-02-23 NOTE — Progress Notes (Signed)
Physical Therapy Session Note  Patient Details  Name: Stefanie [name] MRN: [id number] Date of Birth: [birth year]  Today's Date: 02/23/2015 PT Individual Time: 0900-0930 PT Individual Time Calculation (min): 30 min   Short Term Goals: Week 2:  PT Short Term Goal 1 (Week 2): = LTG due to estimated length of stay  Skilled Therapeutic Interventions/Progress Updates:    Pt received up in w/c - tearing apart paper - PT explains we can place it in shredding bin for her. Pt requests to do laundry and PT agrees to help her get it started. Gait Training - PT instructs pt in ambulation with 4WW x 85' req Supervision (w/c follow for rest break) and intermittent verbal cues for upright posture, which pt is able to comply with, but muscular fatigue and long-standing poor posture habit cause pt to return to crouched ambulation posture. Therapeutic Activity - Pt self propels manual w/c x 100' with B UEs (taking 5 minutes) and verbal cues to push harder with R arm. PT assist pt in starting a washing machine load req SBA for safety when pt stands and assist only to turn knob (too far for pt to reach). Pt ended up in w/c in room with all needs in reach. Continue per PT POC.   Therapy Documentation Precautions:  Precautions Precautions: Fall Precaution Comments: history of B CVAs Restrictions Weight Bearing Restrictions: No Pain: Pain Assessment Pain Assessment: 0-10 Pain Score: 5  Pain Type: Chronic pain Pain Location: Knee Pain Orientation: Right Pain Descriptors / Indicators: Aching;Sore Pain Onset: On-going Pain Intervention(s): Other (Comment) (Pt had just applied muscle rub cream) Multiple Pain Sites: No  See Function Navigator for Current Functional Status.   Therapy/Group: Individual Therapy  Catarino Vold M 02/23/2015, 9:04 AM

## 2015-02-24 ENCOUNTER — Inpatient Hospital Stay (HOSPITAL_COMMUNITY): Payer: Medicare Other

## 2015-02-24 ENCOUNTER — Inpatient Hospital Stay (HOSPITAL_COMMUNITY): Payer: Medicare Other | Admitting: Physical Therapy

## 2015-02-24 LAB — RENAL FUNCTION PANEL
ALBUMIN: 3.3 g/dL — AB (ref 3.5–5.0)
Anion gap: 11 (ref 5–15)
BUN: 51 mg/dL — AB (ref 6–20)
CHLORIDE: 95 mmol/L — AB (ref 101–111)
CO2: 27 mmol/L (ref 22–32)
CREATININE: 7.92 mg/dL — AB (ref 0.44–1.00)
Calcium: 10.1 mg/dL (ref 8.9–10.3)
GFR calc Af Amer: 5 mL/min — ABNORMAL LOW (ref >60)
GFR, EST NON AFRICAN AMERICAN: 5 mL/min — AB (ref >60)
GLUCOSE: 235 mg/dL — AB (ref 65–99)
Phosphorus: 3.9 mg/dL (ref 2.5–4.6)
Potassium: 4.6 mmol/L (ref 3.5–5.1)
Sodium: 133 mmol/L — ABNORMAL LOW (ref 135–145)

## 2015-02-24 LAB — CBC
HCT: 31.3 % — ABNORMAL LOW (ref 36.0–46.0)
Hemoglobin: 10.1 g/dL — ABNORMAL LOW (ref 12.0–15.0)
MCH: 33.1 pg (ref 26.0–34.0)
MCHC: 32.3 g/dL (ref 30.0–36.0)
MCV: 102.6 fL — ABNORMAL HIGH (ref 78.0–100.0)
PLATELETS: 134 10*3/uL — AB (ref 150–400)
RBC: 3.05 MIL/uL — ABNORMAL LOW (ref 3.87–5.11)
RDW: 18.2 % — AB (ref 11.5–15.5)
WBC: 3.3 10*3/uL — AB (ref 4.0–10.5)

## 2015-02-24 LAB — GLUCOSE, CAPILLARY
GLUCOSE-CAPILLARY: 81 mg/dL (ref 65–99)
GLUCOSE-CAPILLARY: 144 mg/dL — AB (ref 65–99)
GLUCOSE-CAPILLARY: 179 mg/dL — AB (ref 65–99)
Glucose-Capillary: 132 mg/dL — ABNORMAL HIGH (ref 65–99)

## 2015-02-24 LAB — PROTIME-INR
INR: 2.31 — AB (ref 0.00–1.49)
Prothrombin Time: 25.2 seconds — ABNORMAL HIGH (ref 11.6–15.2)

## 2015-02-24 MED ORDER — WARFARIN SODIUM 5 MG PO TABS
5.0000 mg | ORAL_TABLET | Freq: Once | ORAL | Status: AC
  Administered 2015-02-24: 5 mg via ORAL
  Filled 2015-02-24: qty 1

## 2015-02-24 NOTE — Progress Notes (Signed)
Physical Therapy Discharge Summary  Patient Details  Name: Stefanie [name] MRN: [id number] Date of Birth: February 08, 1946  Today's Date: 02/24/2015 PT Individual Time: 1000-1100 PT Individual Time Calculation (min): 60 min    Patient has met 8 of 8 long term goals due to improved activity tolerance, improved balance, increased strength and improved coordination.  Patient to discharge at an ambulatory level Supervision.   Patient's care partner unavailable to provide the necessary cognitive assistance at discharge.  Reasons goals not met: N/A  Recommendation:  Patient will benefit from ongoing skilled PT services in skilled nursing facility setting to continue to advance safe functional mobility, address ongoing impairments in balance, activity tolerance and strength, and minimize fall risk.  Equipment: No equipment provided  Reasons for discharge: treatment goals met and discharge from hospital  Patient/family agrees with progress made and goals achieved: Yes  PT Discharge Precautions/Restrictions Precautions Precautions: Fall Precaution Comments: history of B CVAs Restrictions Weight Bearing Restrictions: No Vital Signs   Pain Pain Assessment Pain Assessment: No/denies pain Vision/Perception     Cognition Overall Cognitive Status: Within Functional Limits for tasks assessed Arousal/Alertness: Awake/alert Orientation Level: Oriented X4 Focused Attention: Appears intact Memory: Impaired Memory Impairment: Decreased recall of new information Awareness: Appears intact Problem Solving: Appears intact Safety/Judgment: Appears intact Sensation Sensation Proprioception Impaired Details: Impaired LLE;Impaired RLE Motor  Motor Motor: Abnormal tone;Ataxia;Abnormal postural alignment and control  Mobility Bed Mobility Bed Mobility: Rolling Right;Rolling Left;Sit to Supine;Supine to Sit Rolling Right: 6: Modified independent (Device/Increase time) Rolling Left: 6: Modified  independent (Device/Increase time) Supine to Sit: 6: Modified independent (Device/Increase time) Sit to Supine: 6: Modified independent (Device/Increase time) Transfers Transfers: Yes Sit to Stand: 5: Supervision Stand to Sit: 5: Supervision Stand Pivot Transfers: 5: Supervision Locomotion  Ambulation Ambulation: Yes Ambulation/Gait Assistance: 5: Supervision Ambulation Distance (Feet): 175 Feet Assistive device: 4-wheeled walker Stairs / Additional Locomotion Stairs: Yes Stairs Assistance: 4: Min assist Stair Management Technique: Two rails Number of Stairs: 4 Height of Stairs: 6 Wheelchair Mobility Wheelchair Mobility: Yes Wheelchair Assistance: 5: Careers information officer: Both upper extremities Distance: 75  Trunk/Postural Assessment  Cervical Assessment Cervical Assessment: Exceptions to [facility] Thoracic Assessment Thoracic Assessment: Exceptions to [facility] Lumbar Assessment Lumbar Assessment: Exceptions to [facility] Postural Control Postural Control: Deficits on evaluation  Balance Static Sitting Balance Static Sitting - Balance Support: Feet supported Static Sitting - Level of Assistance: 6: Modified independent (Device/Increase time) Static Standing Balance Static Standing - Balance Support: During functional activity Static Standing - Level of Assistance: 5: Stand by assistance Dynamic Standing Balance Dynamic Standing - Balance Support: During functional activity Dynamic Standing - Level of Assistance: 5: Stand by assistance Extremity Assessment B UEs as per OT discharge summary.   RLE Assessment RLE Assessment: Within Functional Limits LLE Assessment LLE Assessment: Exceptions to [facility]   TREATMENT: Pt was seen bedside in the am. Pt willing to participate with therapy. Pt propelled w/c x 2 for 75 feet each time with S and verbal cues with increased time. Pt tends to veer to the R secondary to R UE weakness. Pt performed all sit to stand and stand pivot transfers  with 4WW and S. PT performed car transfers with 4WW and S with verbal cues for safety and increased time. Pt ambulated 175 feet with 4WW andS, with 2 standing rest breaks. Pt performed bed mobility with mod I. PT ascended/descended 4 stairs with 2 rails and min A with increased WOB and fatigue. Following treatment pt returned to room and left  sitting up in w/c with call bell within reach.    See Function Navigator for Current Functional Status.  Dub Amis 02/24/2015, 12:41 PM

## 2015-02-24 NOTE — Plan of Care (Signed)
Problem: RH Toilet Transfers Goal: LTG Patient will perform toilet transfers w/assist (OT) LTG: Patient will perform toilet transfers with assist, with/without cues using equipment (OT)  Outcome: Not Met (add Reason) Pt requires supervision for task

## 2015-02-24 NOTE — Progress Notes (Signed)
Occupational Therapy Session Note  Patient Details  Name: [name] Bottger MRN: [id number] Date of Birth: 04/25/1945  Today's Date: 02/24/2015 OT Individual Time: 0800-0900 OT Individual Time Calculation (min): 60 min    Short Term Goals: Week 2:  OT Short Term Goal 1 (Week 2): Pt will perform LB bathing with supervision shower level sit to stand. OT Short Term Goal 2 (Week 2): Pt will perform LB dressing with supervision sit to stand.  OT Short Term Goal 3 (Week 2): Pt will perform toilet transfers and toileting with supervision sit to stand.  OT Short Term Goal 4 (Week 2): Pt will perform simple snack prep with RW and close supervision.   Skilled Therapeutic Interventions/Progress Updates:    Pt engaged in BADL retraining including bathing at shower level and dressing with sit<>stand from w/c at sink.  Pt required more than a reasonable amount of time to complete all tasks with multiple rest breaks.  Focus on activity tolerance, sit<>stand, standing balance, functional transfers, and safety awareness to increased independence with BADLs.   Therapy Documentation Precautions:  Precautions Precautions: Fall Precaution Comments: history of B CVAs Restrictions Weight Bearing Restrictions: No   Pain:  Pt c/o increased pain in Rt knee with activity/standing; repositioned  See Function Navigator for Current Functional Status.   Therapy/Group: Individual Therapy  Leroy Libman 02/24/2015, 9:00 AM

## 2015-02-24 NOTE — Progress Notes (Signed)
Occupational Therapy Discharge Summary  Patient Details  Name: Stefanie [name] MRN: [id number] Date of Birth: Feb 19, 1946   Patient has met 6 of 9 long term goals due to improved activity tolerance, improved balance, postural control, ability to compensate for deficits and improved awareness.  Pt made steady progress with BADLs and IADLs during this admission but continues to require supervision for LB dressing tasks, toilet transfers, and toileting. Pt requires more than a reasonable amount of time to complete tasks with multiple rest breaks. Patient to discharge at overall Supervision level.  Patient's care partner unavailable to provide the necessary physical assistance at discharge.    Reasons goals not met: Pt continues to require supervision with LB dressing tasks, toilet transfers, and toileting.    Recommendation:  Patient will benefit from ongoing skilled OT services in skilled nursing facility setting to continue to advance functional skills in the area of BADL, iADL and Reduce care partner burden.  Equipment: No equipment providedPt d/c to SNF  Reasons for discharge: treatment goals met and discharge from hospital  Patient/family agrees with progress made and goals achieved: Yes  OT Discharge Vision/Perception  Vision- History Baseline Vision/History: Wears glasses Wears Glasses: Reading only Patient Visual Report: No change from baseline Vision- Assessment Vision Assessment?: No apparent visual deficits  Cognition Overall Cognitive Status: Within Functional Limits for tasks assessed Arousal/Alertness: Awake/alert Orientation Level: Oriented X4 Attention: Sustained Focused Attention: Appears intact Sustained Attention: Appears intact Memory: Impaired Memory Impairment: Decreased recall of new information Awareness: Appears intact Problem Solving: Appears intact Safety/Judgment: Appears intact Sensation Sensation Light Touch: Impaired Detail Light Touch Impaired  Details: Impaired RUE Stereognosis: Impaired Detail Stereognosis Impaired Details: Impaired RUE Hot/Cold: Appears Intact Proprioception: Not tested Proprioception Impaired Details: Impaired LLE;Impaired RLE Coordination Gross Motor Movements are Fluid and Coordinated: No Fine Motor Movements are Fluid and Coordinated: No Coordination and Movement Description: Pt with gross dysmetria bilaterally with decreased FM capabilities bilaterally as well with right hand being more limited than LUE.  She was able to use them both for bilateral tasks such as donning all clothing.  Finger Nose Finger Test: ataxic in B UEs; R worse than L Motor  Motor Motor: Abnormal tone;Ataxia;Abnormal postural alignment and control Mobility  Bed Mobility Bed Mobility: Rolling Right;Rolling Left;Sit to Supine;Supine to Sit Rolling Right: 6: Modified independent (Device/Increase time) Rolling Left: 6: Modified independent (Device/Increase time) Supine to Sit: 6: Modified independent (Device/Increase time) Sit to Supine: 6: Modified independent (Device/Increase time) Transfers Sit to Stand: 5: Supervision Stand to Sit: 5: Supervision  Trunk/Postural Assessment  Cervical Assessment Cervical Assessment: Exceptions to [facility] Cervical AROM Overall Cervical AROM: Deficits;Due to premorid status Overall Cervical AROM Comments: head postured forward and down Thoracic Assessment Thoracic Assessment: Exceptions to [facility] Thoracic AROM Overall Thoracic AROM: Deficits;Due to premorid status Overall Thoracic AROM Comments: significant kyphotic back Lumbar Assessment Lumbar Assessment: Exceptions to [facility] Postural Control Postural Control: Deficits on evaluation Postural Limitations: posterior pelvic tilt with decreased ability to achieve full hip extension and anterior pelvic tilt in standing.   Balance Static Sitting Balance Static Sitting - Balance Support: Feet supported Static Sitting - Level of Assistance: 6: Modified  independent (Device/Increase time) Dynamic Sitting Balance Dynamic Sitting - Level of Assistance: 5: Stand by assistance Static Standing Balance Static Standing - Balance Support: During functional activity Static Standing - Level of Assistance: 5: Stand by assistance Dynamic Standing Balance Dynamic Standing - Balance Support: During functional activity Dynamic Standing - Level of Assistance: 5: Stand by assistance Extremity/Trunk  Assessment RUE AROM (degrees) Overall AROM Right Upper Extremity: Deficits;Due to premorbid status RUE Overall AROM Comments: Pt with history of left CVA.   Demonstrates decreased strength throughout at 3+/5 with gross digit flexion and extension WFLS except for thumb.  Pt with limitations in IP and MP flexion and extension and unable to oppose thumb to tip of any digit.   RUE Strength RUE Overall Strength: Deficits;Due to premorbid status RUE Overall Strength Comments: arm flexion 3-/5, elbow 4+/5, grip 4+/5 RUE Tone RUE Tone: Hypertonic;Moderate Hypertonic Details: Pt finger flexors and elbow flexors with increased tone, but able to overcome with effort LUE Assessment LUE Assessment: Exceptions to WFL LUE AROM (degrees) Overall AROM Left Upper Extremity: Within functional limits for tasks assessed LUE Overall AROM Comments: AROM for RUE WFLS with strength at 4/5 throughout.  Dysmetria noted with decreased gross and FM coordination during functional use with bathing and dressing tasks.  LUE Strength LUE Overall Strength: Within Functional Limits for tasks assessed LUE Overall Strength Comments: arm flexion 4+/5; otherwise 5/5 LUE Tone LUE Tone: Mild;Hypertonic Hypertonic Details: elbow flexors   See Function Navigator for Current Functional Status.  Leotis Shames [facility] 02/24/2015, 3:56 PM

## 2015-02-24 NOTE — Plan of Care (Signed)
Problem: RH Toileting Goal: LTG Patient will perform toileting w/assist, cues/equip (OT) LTG: Patient will perform toiletiing (clothes management/hygiene) with assist, with/without cues using equipment (OT)  Outcome: Not Met (add Reason) Pt requires supervision for task

## 2015-02-24 NOTE — Progress Notes (Signed)
[place] PHYSICAL MEDICINE & REHABILITATION     PROGRESS NOTE    Subjective/Complaints: No issues overnite except was in HD from 9p-230a ROS: Pt denies , nausea, vomiting, abdominal pain, diarrhea, chest pain, shortness of breath,  Objective: Vital Signs: Blood pressure 145/62, pulse 62, temperature 97.9 F (36.6 C), temperature source Oral, resp. rate 17, weight 77 kg (169 lb 12.1 oz), SpO2 100 %. No results found.  Recent Labs  02/23/15 2329 02/24/15 0453  WBC 3.4* 3.3*  HGB 9.9* 10.1*  HCT 30.5* 31.3*  PLT 171 134*    Recent Labs  02/21/15 1545 02/23/15 2328  NA 133* 133*  K 4.7 4.6  CL 96* 95*  GLUCOSE 141* 235*  BUN 44* 51*  CREATININE 8.22* 7.92*  CALCIUM 9.2 10.1   CBG (last 3)   Recent Labs  02/23/15 1621 02/23/15 2035 02/24/15 0610  GLUCAP 128* 190* 81    Wt Readings from Last 3 Encounters:  02/24/15 77 kg (169 lb 12.1 oz)  02/08/15 82.4 kg (181 lb 10.5 oz)  03/23/14 88.451 kg (195 lb)    Physical Exam:  Constitutional: She is oriented to person, place, and time. She appears well-developed and well-nourished.  HENT:  Head: Normocephalic and atraumatic.   Eyes: Conjunctivae are normal. Pupils are equal, round, and reactive to light.  Neck: Normal range of motion. Neck supple.  Cardiovascular: Normal rate and regular rhythm.  No murmur heard. Respiratory: Effort normal and breath sounds normal. No respiratory distress. She has no wheezes.  GI: Soft. She exhibits no distension. There is no tenderness.   Neurological: She is alert and oriented to person, place, and time.  Speech dysarthric (edentulous) and able to follow basic commands without difficulty.   Psychiatric: She has a normal mood and affect. Her behavior is normal. Thought content normal.   Assessment/Plan: 1. Weakness and balance deficits secondary to multiple medical issues/debility which require 3+ hours per day of interdisciplinary therapy in a comprehensive  inpatient rehab setting. Physiatrist is providing close team supervision and 24 hour management of active medical problems listed below. Physiatrist and rehab team continue to assess barriers to discharge/monitor patient progress toward functional and medical goals.  Will plan d/c to SNF on Monday, HD today and Mon .Function:  Bathing Bathing position   Position: Shower  Bathing parts Body parts bathed by patient: Right arm, Left arm, Chest, Abdomen, Front perineal area, Right upper leg, Left upper leg, Buttocks, Right lower leg, Left lower leg Body parts bathed by helper: Back  Bathing assist Assist Level: Supervision or verbal cues   Set up : To adjust water temperature, To obtain items  Upper Body Dressing/Undressing Upper body dressing   What is the patient wearing?: Pull over shirt/dress, Bra Bra - Perfomed by patient: Thread/unthread right bra strap, Thread/unthread left bra strap, Hook/unhook bra (pull down sports bra) Bra - Perfomed by helper: Hook/unhook bra (pull down sports bra) Pull over shirt/dress - Perfomed by patient: Thread/unthread right sleeve, Thread/unthread left sleeve, Put head through opening, Pull shirt over trunk          Upper body assist Assist Level: More than reasonable time   Set up : To obtain clothing/put away  Lower Body Dressing/Undressing Lower body dressing   What is the patient wearing?: Pants, Shoes Underwear - Performed by patient: Thread/unthread right underwear leg, Thread/unthread left underwear leg, Pull underwear up/down Underwear - Performed by helper: Pull underwear up/down Pants- Performed by patient: Thread/unthread right pants leg, Thread/unthread left  pants leg, Pull pants up/down Pants- Performed by helper: Thread/unthread left pants leg, Thread/unthread right pants leg Non-skid slipper socks- Performed by patient: Don/doff right sock, Don/doff left sock Non-skid slipper socks- Performed by helper: Don/doff right sock, Don/doff  left sock Socks - Performed by patient: Don/doff right sock, Don/doff left sock   Shoes - Performed by patient: Don/doff right shoe, Don/doff left shoe, Fasten right, Fasten left Shoes - Performed by helper: Don/doff right shoe, Don/doff left shoe          Lower body assist Assist for lower body dressing: Supervision or verbal cues, Set up, More than reasonable time   Set up : To obtain clothing/put away  Toileting Toileting   Toileting steps completed by patient: Adjust clothing prior to toileting, Adjust clothing after toileting, Performs perineal hygiene Toileting steps completed by helper: Adjust clothing after toileting Toileting Assistive Devices: Grab bar or rail  Toileting assist Assist level: Touching or steadying assistance (Pt.75%)   Transfers Chair/bed transfer   Chair/bed transfer method: Ambulatory Chair/bed transfer assist level: Touching or steadying assistance (Pt > 75%) Chair/bed transfer assistive device: Walker, Air cabin crew     Max distance: 61' Assist level: Supervision or verbal cues   Wheelchair   Type: Manual Max wheelchair distance: 100' Assist Level: Supervision or verbal cues  Cognition Comprehension Comprehension assist level: Understands complex 90% of the time/cues 10% of the time  Expression Expression assist level: Expresses complex 90% of the time/cues < 10% of the time  Social Interaction Social Interaction assist level: Interacts appropriately with others - No medications needed.  Problem Solving Problem solving assist level: Solves basic 90% of the time/requires cueing < 10% of the time  Memory Memory assist level: Recognizes or recalls 90% of the time/requires cueing < 10% of the time   Medical Problem List and Plan: 1. 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 LLE DVT Prophylaxis/Anticoagulation:  Pharmaceutical: Coumadin therapeutic, no signs of bleeding 3. Pain Management: ankle pain on left likely due to DVT 4. Mood: LCSW to follow up for evaluation and support.  5. Neuropsych: This patient is capable of making decisions on her own behalf. 6. Skin/Wound Care: Routine pressure relief measures 7. Fluids/Electrolytes/Nutrition: Continue 1200 cc FR daily.  8. CAD: Treat medically and monitor for symptoms/tolerance with increase in activity. 9. HTN: monitor BP bid on lisinopril and coreg. Adjusting per Nephro 10. ESRD: Appreciate Nephro notes. Convert HD T,Th, Sa to M,W,F  -AVG site without further bleeding,    11. DM type 2: Used novolog bid prn at home. May benefit from scheduled insulin dosing. Will follow for pattern and Continue to monitor BS ac/hs and use SSI for elevated BS.  -HS lantus, , 12/8 72 , reduce dose- 134 today 12. Anemia of chronic disease: On Aranesp. Monitor and transfuse prn symptoms or Hgb less than 7.0--hgb gradually improving,  9.5 13. Thrombocytopenia: Monitoring closely with heparin on board.180K on 12/6 14. PVD: Off plavix ,multiple stents left iliac vein. --now on warfarin,  15. 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

## 2015-02-24 NOTE — Progress Notes (Signed)
ANTICOAGULATION CONSULT NOTE - Follow Up Consult  Pharmacy Consult for Warfarin Indication: DVT  No Known Allergies  Patient Measurements: Weight: 169 lb 12.1 oz (77 kg)  Vital Signs: Temp: 97.9 F (36.6 C) (12/11 0556) Temp Source: Oral (12/11 0556) BP: 133/62 mmHg (12/11 0800) Pulse Rate: 60 (12/11 0800)  Labs:  Recent Labs  02/21/15 1545 02/22/15 0902 02/23/15 0546 02/23/15 2328 02/23/15 2329 02/24/15 0453  HGB 9.0*  --   --   --  9.9* 10.1*  HCT 27.6*  --   --   --  30.5* 31.3*  PLT 152  --   --   --  171 134*  LABPROT  --  20.9* 23.8*  --   --  25.2*  INR  --  1.80* 2.15*  --   --  2.31*  CREATININE 8.22*  --   --  7.92*  --   --     Estimated Creatinine Clearance: 6.7 mL/min (by C-G formula based on Cr of 7.92).   Medications:  Scheduled:  . antiseptic oral rinse  7 mL Mouth Rinse BID  . calcitRIOL  1.5 mcg Oral Q T,Th,Sa-HD  . calcium acetate  667 mg Oral TID WC  . carvedilol  3.125 mg Oral BID WC  . cinacalcet  90 mg Oral Q breakfast  . darbepoetin (ARANESP) injection - DIALYSIS  100 mcg Intravenous Q Tue-HD  . gabapentin  300 mg Oral QHS  . heparin  4,000 Units Dialysis Once in dialysis  . insulin aspart  0-5 Units Subcutaneous QHS  . insulin aspart  0-9 Units Subcutaneous TID WC  . insulin glargine  10 Units Subcutaneous QHS  . ketoconazole  1 application Topical Daily  . latanoprost  1 drop Left Eye QHS  . multivitamin  1 tablet Oral QHS  . pantoprazole  40 mg Oral Daily  . polycarbophil  1,250 mg Oral Daily  . polyethylene glycol  17 g Oral Daily  . pravastatin  40 mg Oral Daily  . rOPINIRole  0.5 mg Oral QHS  . saccharomyces boulardii  250 mg Oral BID  . Warfarin - Pharmacist Dosing Inpatient   Does not apply q1800   Infusions:   PRN: sodium chloride, sodium chloride, acetaminophen, albuterol, alteplase, bisacodyl, diclofenac sodium, docusate sodium, guaiFENesin, heparin, heparin, lidocaine (PF), lidocaine-prilocaine, MUSCLE RUB,  nitroGLYCERIN, ondansetron, ondansetron, pentafluoroprop-tetrafluoroeth, traMADol  Assessment: 7 YOF on coumadin for DVT. INR now therapeutic at 2.15 after subtherapeutic INR yesterday. Will continue 7.5/5 mg alternating every day warfarin regimen. No bleeding noted.  Hgb 10.1, plts 134  Goal of Therapy:  INR 2-3 Monitor platelets by anticoagulation protocol: Yes   Plan:  Warfarin 5 mg x1  Monitor for s/sx of bleeding Daily INR, CBC Q3D  Kelsy E Combs 02/24/2015,12:12 PM

## 2015-02-24 NOTE — Plan of Care (Signed)
Problem: RH Dressing Goal: LTG Patient will perform lower body dressing w/assist (OT) LTG: Patient will perform lower body dressing with assist, with/without cues in positioning using equipment (OT)  Outcome: Not Met (add Reason) Pt requires supervision during task

## 2015-02-25 DIAGNOSIS — Z791 Long term (current) use of non-steroidal anti-inflammatories (NSAID): Secondary | ICD-10-CM | POA: Diagnosis not present

## 2015-02-25 DIAGNOSIS — S81812A Laceration without foreign body, left lower leg, initial encounter: Secondary | ICD-10-CM | POA: Diagnosis not present

## 2015-02-25 DIAGNOSIS — E1121 Type 2 diabetes mellitus with diabetic nephropathy: Secondary | ICD-10-CM | POA: Diagnosis not present

## 2015-02-25 DIAGNOSIS — D649 Anemia, unspecified: Secondary | ICD-10-CM | POA: Diagnosis not present

## 2015-02-25 DIAGNOSIS — E1129 Type 2 diabetes mellitus with other diabetic kidney complication: Secondary | ICD-10-CM | POA: Diagnosis not present

## 2015-02-25 DIAGNOSIS — E441 Mild protein-calorie malnutrition: Secondary | ICD-10-CM | POA: Diagnosis not present

## 2015-02-25 DIAGNOSIS — Y829 Unspecified medical devices associated with adverse incidents: Secondary | ICD-10-CM | POA: Diagnosis not present

## 2015-02-25 DIAGNOSIS — Z87891 Personal history of nicotine dependence: Secondary | ICD-10-CM | POA: Diagnosis not present

## 2015-02-25 DIAGNOSIS — T82838A Hemorrhage of vascular prosthetic devices, implants and grafts, initial encounter: Secondary | ICD-10-CM | POA: Diagnosis not present

## 2015-02-25 DIAGNOSIS — I2510 Atherosclerotic heart disease of native coronary artery without angina pectoris: Secondary | ICD-10-CM | POA: Diagnosis not present

## 2015-02-25 DIAGNOSIS — Z7901 Long term (current) use of anticoagulants: Secondary | ICD-10-CM | POA: Diagnosis not present

## 2015-02-25 DIAGNOSIS — R079 Chest pain, unspecified: Secondary | ICD-10-CM | POA: Diagnosis not present

## 2015-02-25 DIAGNOSIS — I120 Hypertensive chronic kidney disease with stage 5 chronic kidney disease or end stage renal disease: Secondary | ICD-10-CM | POA: Diagnosis not present

## 2015-02-25 DIAGNOSIS — I82402 Acute embolism and thrombosis of unspecified deep veins of left lower extremity: Secondary | ICD-10-CM | POA: Diagnosis not present

## 2015-02-25 DIAGNOSIS — N186 End stage renal disease: Secondary | ICD-10-CM | POA: Diagnosis not present

## 2015-02-25 DIAGNOSIS — T82510A Breakdown (mechanical) of surgically created arteriovenous fistula, initial encounter: Secondary | ICD-10-CM | POA: Diagnosis not present

## 2015-02-25 DIAGNOSIS — I69959 Hemiplegia and hemiparesis following unspecified cerebrovascular disease affecting unspecified side: Secondary | ICD-10-CM | POA: Diagnosis not present

## 2015-02-25 DIAGNOSIS — E1140 Type 2 diabetes mellitus with diabetic neuropathy, unspecified: Secondary | ICD-10-CM | POA: Diagnosis not present

## 2015-02-25 DIAGNOSIS — Z8669 Personal history of other diseases of the nervous system and sense organs: Secondary | ICD-10-CM | POA: Diagnosis not present

## 2015-02-25 DIAGNOSIS — T83498A Other mechanical complication of other prosthetic devices, implants and grafts of genital tract, initial encounter: Secondary | ICD-10-CM | POA: Diagnosis not present

## 2015-02-25 DIAGNOSIS — Z9221 Personal history of antineoplastic chemotherapy: Secondary | ICD-10-CM | POA: Diagnosis not present

## 2015-02-25 DIAGNOSIS — I509 Heart failure, unspecified: Secondary | ICD-10-CM | POA: Diagnosis not present

## 2015-02-25 DIAGNOSIS — R58 Hemorrhage, not elsewhere classified: Secondary | ICD-10-CM | POA: Diagnosis not present

## 2015-02-25 DIAGNOSIS — H33003 Unspecified retinal detachment with retinal break, bilateral: Secondary | ICD-10-CM | POA: Diagnosis not present

## 2015-02-25 DIAGNOSIS — R5381 Other malaise: Secondary | ICD-10-CM | POA: Diagnosis not present

## 2015-02-25 DIAGNOSIS — E119 Type 2 diabetes mellitus without complications: Secondary | ICD-10-CM | POA: Diagnosis not present

## 2015-02-25 DIAGNOSIS — I6350 Cerebral infarction due to unspecified occlusion or stenosis of unspecified cerebral artery: Secondary | ICD-10-CM | POA: Diagnosis not present

## 2015-02-25 DIAGNOSIS — Z8673 Personal history of transient ischemic attack (TIA), and cerebral infarction without residual deficits: Secondary | ICD-10-CM | POA: Diagnosis not present

## 2015-02-25 DIAGNOSIS — Z9889 Other specified postprocedural states: Secondary | ICD-10-CM | POA: Diagnosis not present

## 2015-02-25 DIAGNOSIS — I10 Essential (primary) hypertension: Secondary | ICD-10-CM | POA: Diagnosis not present

## 2015-02-25 DIAGNOSIS — T849XXA Unspecified complication of internal orthopedic prosthetic device, implant and graft, initial encounter: Secondary | ICD-10-CM | POA: Diagnosis not present

## 2015-02-25 DIAGNOSIS — Z992 Dependence on renal dialysis: Secondary | ICD-10-CM | POA: Diagnosis not present

## 2015-02-25 DIAGNOSIS — Z79899 Other long term (current) drug therapy: Secondary | ICD-10-CM | POA: Diagnosis not present

## 2015-02-25 DIAGNOSIS — N2581 Secondary hyperparathyroidism of renal origin: Secondary | ICD-10-CM | POA: Diagnosis not present

## 2015-02-25 DIAGNOSIS — Z794 Long term (current) use of insulin: Secondary | ICD-10-CM | POA: Diagnosis not present

## 2015-02-25 DIAGNOSIS — E785 Hyperlipidemia, unspecified: Secondary | ICD-10-CM | POA: Diagnosis not present

## 2015-02-25 DIAGNOSIS — R0602 Shortness of breath: Secondary | ICD-10-CM | POA: Diagnosis not present

## 2015-02-25 DIAGNOSIS — E211 Secondary hyperparathyroidism, not elsewhere classified: Secondary | ICD-10-CM | POA: Diagnosis not present

## 2015-02-25 DIAGNOSIS — D631 Anemia in chronic kidney disease: Secondary | ICD-10-CM | POA: Diagnosis not present

## 2015-02-25 DIAGNOSIS — T829XXA Unspecified complication of cardiac and vascular prosthetic device, implant and graft, initial encounter: Secondary | ICD-10-CM | POA: Diagnosis not present

## 2015-02-25 LAB — RENAL FUNCTION PANEL
Albumin: 3.5 g/dL (ref 3.5–5.0)
Anion gap: 11 (ref 5–15)
BUN: 42 mg/dL — AB (ref 6–20)
CALCIUM: 9.9 mg/dL (ref 8.9–10.3)
CO2: 27 mmol/L (ref 22–32)
CREATININE: 6.93 mg/dL — AB (ref 0.44–1.00)
Chloride: 95 mmol/L — ABNORMAL LOW (ref 101–111)
GFR calc non Af Amer: 5 mL/min — ABNORMAL LOW (ref >60)
GFR, EST AFRICAN AMERICAN: 6 mL/min — AB (ref >60)
GLUCOSE: 147 mg/dL — AB (ref 65–99)
Phosphorus: 4.2 mg/dL (ref 2.5–4.6)
Potassium: 4.3 mmol/L (ref 3.5–5.1)
SODIUM: 133 mmol/L — AB (ref 135–145)

## 2015-02-25 LAB — GLUCOSE, CAPILLARY
Glucose-Capillary: 117 mg/dL — ABNORMAL HIGH (ref 65–99)
Glucose-Capillary: 129 mg/dL — ABNORMAL HIGH (ref 65–99)

## 2015-02-25 LAB — CBC
HEMATOCRIT: 30.8 % — AB (ref 36.0–46.0)
Hemoglobin: 9.6 g/dL — ABNORMAL LOW (ref 12.0–15.0)
MCH: 32 pg (ref 26.0–34.0)
MCHC: 31.2 g/dL (ref 30.0–36.0)
MCV: 102.7 fL — ABNORMAL HIGH (ref 78.0–100.0)
PLATELETS: 149 10*3/uL — AB (ref 150–400)
RBC: 3 MIL/uL — ABNORMAL LOW (ref 3.87–5.11)
RDW: 18 % — AB (ref 11.5–15.5)
WBC: 3.6 10*3/uL — AB (ref 4.0–10.5)

## 2015-02-25 LAB — PROTIME-INR
INR: 2.21 — AB (ref 0.00–1.49)
Prothrombin Time: 24.3 seconds — ABNORMAL HIGH (ref 11.6–15.2)

## 2015-02-25 MED ORDER — WARFARIN SODIUM 5 MG PO TABS: 5.0000 mg | ORAL_TABLET | ORAL | Status: DC

## 2015-02-25 MED ORDER — SODIUM CHLORIDE 0.9 % IV SOLN: 100.0000 mL | INTRAVENOUS | Status: DC | PRN

## 2015-02-25 MED ORDER — WARFARIN SODIUM 5 MG PO TABS: 7.5000 mg | ORAL_TABLET | Freq: Every day | ORAL | Status: DC

## 2015-02-25 MED ORDER — DARBEPOETIN ALFA 100 MCG/0.5ML IJ SOSY
PREFILLED_SYRINGE | INTRAMUSCULAR | Status: AC
Start: 2015-02-25 — End: 2015-02-25
  Filled 2015-02-25: qty 0.5

## 2015-02-25 MED ORDER — PENTAFLUOROPROP-TETRAFLUOROETH EX AERO: 1.0000 "application " | INHALATION_SPRAY | CUTANEOUS | Status: DC | PRN

## 2015-02-25 MED ORDER — RENA-VITE PO TABS: 1.0000 | ORAL_TABLET | Freq: Every day | ORAL | Status: DC

## 2015-02-25 MED ORDER — NITROGLYCERIN 0.4 MG SL SUBL: 0.4000 mg | SUBLINGUAL_TABLET | SUBLINGUAL | Status: DC | PRN

## 2015-02-25 MED ORDER — CALCIUM POLYCARBOPHIL 625 MG PO TABS: 1250.0000 mg | ORAL_TABLET | Freq: Every day | ORAL | Status: DC

## 2015-02-25 MED ORDER — INSULIN GLARGINE 100 UNIT/ML ~~LOC~~ SOLN: 10.0000 [IU] | Freq: Every day | SUBCUTANEOUS | Status: DC

## 2015-02-25 MED ORDER — CARVEDILOL 3.125 MG PO TABS: 3.1250 mg | ORAL_TABLET | Freq: Two times a day (BID) | ORAL | Status: DC

## 2015-02-25 MED ORDER — LIDOCAINE-PRILOCAINE 2.5-2.5 % EX CREA: 1.0000 "application " | TOPICAL_CREAM | CUTANEOUS | Status: AC | PRN

## 2015-02-25 MED ORDER — HEPARIN SODIUM (PORCINE) 1000 UNIT/ML DIALYSIS: 1000.0000 [IU] | INTRAMUSCULAR | Status: DC | PRN

## 2015-02-25 MED ORDER — POLYETHYLENE GLYCOL 3350 17 G PO PACK: 17.0000 g | PACK | Freq: Every day | ORAL | Status: DC | PRN

## 2015-02-25 MED ORDER — LIDOCAINE-PRILOCAINE 2.5-2.5 % EX CREA
1.0000 "application " | TOPICAL_CREAM | CUTANEOUS | Status: DC | PRN
  Filled 2015-02-25: qty 5

## 2015-02-25 MED ORDER — WARFARIN SODIUM 7.5 MG PO TABS
7.5000 mg | ORAL_TABLET | ORAL | Status: DC
  Filled 2015-02-25: qty 1

## 2015-02-25 MED ORDER — MUSCLE RUB 10-15 % EX CREA: 1.0000 "application " | TOPICAL_CREAM | Freq: Two times a day (BID) | CUTANEOUS | Status: DC | PRN

## 2015-02-25 MED ORDER — CALCITRIOL 0.5 MCG PO CAPS: 1.5000 ug | ORAL_CAPSULE | ORAL | Status: DC

## 2015-02-25 MED ORDER — GUAIFENESIN ER 600 MG PO TB12: 600.0000 mg | ORAL_TABLET | Freq: Two times a day (BID) | ORAL | Status: DC | PRN

## 2015-02-25 MED ORDER — LIDOCAINE HCL (PF) 1 % IJ SOLN: 5.0000 mL | INTRAMUSCULAR | Status: DC | PRN

## 2015-02-25 MED ORDER — ACETAMINOPHEN 325 MG PO TABS: 325.0000 mg | ORAL_TABLET | ORAL | Status: DC | PRN

## 2015-02-25 MED ORDER — DARBEPOETIN ALFA 100 MCG/0.5ML IJ SOSY: 100.0000 ug | PREFILLED_SYRINGE | INTRAMUSCULAR | Status: DC

## 2015-02-25 MED ORDER — SACCHAROMYCES BOULARDII 250 MG PO CAPS: 250.0000 mg | ORAL_CAPSULE | Freq: Two times a day (BID) | ORAL | Status: DC

## 2015-02-25 MED ORDER — HEPARIN SODIUM (PORCINE) 1000 UNIT/ML DIALYSIS: 4000.0000 [IU] | Freq: Once | INTRAMUSCULAR | Status: DC

## 2015-02-25 MED ORDER — ALTEPLASE 2 MG IJ SOLR
2.0000 mg | Freq: Once | INTRAMUSCULAR | Status: DC | PRN
  Filled 2015-02-25: qty 2

## 2015-02-25 MED ORDER — WARFARIN SODIUM 5 MG PO TABS: ORAL_TABLET | ORAL | Status: DC

## 2015-02-25 NOTE — Discharge Summary (Signed)
Physician Discharge Summary  Patient ID: Stefanie [name] MRN: [id number] DOB/AGE: 09-10-1945 69 y.o.  Admit date: 02/08/2015 Discharge date: 02/25/2015  Discharge Diagnoses:  Principal Problem:   Debility Active Problems:   Essential hypertension   ESRD on dialysis [facility])   Type 2 diabetes, controlled, with neuropathy ([place])   Left leg DVT ([place])   DVT (deep venous thrombosis) ([place])   History of CVA with residual deficit   Discharged Condition: Stable   Significant Diagnostic Studies: Dg Chest 2 View  02/12/2015  CLINICAL DATA:  Short of breath EXAM: CHEST  2 VIEW COMPARISON:  02/02/2015 FINDINGS: Stable linear metal object projecting over the right apex. Moderate cardiomegaly. Lungs under aerated and clear. No pleural effusion. No pneumothorax. Vascular congestion. IMPRESSION: Cardiomegaly and vascular congestion. Electronically Signed   By: Marybelle Killings M.D.   On: 02/12/2015 20:44     Labs:  Basic Metabolic Panel:  Recent Labs Lab 02/19/15 1540 02/21/15 1545 02/23/15 2328 02/25/15 0947  NA 132* 133* 133* 133*  K 4.7 4.7 4.6 4.3  CL 93* 96* 95* 95*  CO2 27 26 27 27   GLUCOSE 185* 141* 235* 147*  BUN 40* 44* 51* 42*  CREATININE 8.91* 8.22* 7.92* 6.93*  CALCIUM 9.5 9.2 10.1 9.9  PHOS 4.5 4.1 3.9 4.2    CBC:  Recent Labs Lab 02/23/15 2329 02/24/15 0453 02/25/15 0437  WBC 3.4* 3.3* 3.6*  HGB 9.9* 10.1* 9.6*  HCT 30.5* 31.3* 30.8*  MCV 102.0* 102.6* 102.7*  PLT 171 134* 149*    CBG:  Recent Labs Lab 02/24/15 0610 02/24/15 1145 02/24/15 1642 02/24/15 2114 02/25/15 0644  GLUCAP 81 132* 144* 179* 117*    Brief HPI:   Stefanie S Newman is a 69 y.o. female with history fo HTN, DM type 2 with nephropathy, ESRD, CAD who was admitted from [facility] on [facility]/01/15 with SOB, left leg discomfort with edema and Left proximal femoral DVT. CTA lungs negative for PE and she was started on IV heparin. Dr. Cathie Olden consulted for input due to progressive SOB limiting activity.  Myoview done showing high risk with mixed infarct and ischemia therefore cath recommended for workup. Cardiac cath showed left ventricular EF < 25% with global hypokinesis and 100% stenosis 1st Diag lesion, 100% stenosed mid RCA lesion, 100% stenosed with faint left to right collaterals are present. To be treated medically per Dr. Burt Knack. Patient with history of left thigh AVG and Dr. Scot Dock consulted for input. 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 Oneida Alar on 11/23. PT evaluation done post procedure and CIR recommended due to deconditioned state, decreased balance, poor safety and inability to walk.    Hospital Course: Stefanie S Brisbin was admitted to rehab 02/08/2015 for inpatient therapies to consist of PT and OT at least three hours five days a week. Past admission physiatrist, therapy team and rehab RN have worked together to provide customized collaborative inpatient rehab. Respiratory status has improve and no complaints of chest discomfort reported with increase in activity. She continues to require more than normal amount of time to complete tasks with multiple rest breaks.  Diabetes has been monitored with ac/hs checks and blood sugars were noted to be poorly controlled.  Insulin glargine was resumed and has been titrated to 10 units at bedtime.  Po intake has been good and she is continent of bowel.  Blood pressures have been monitored on bid basis and  lisinopril was discontinued to prevent hypotensive episodes with HD.   Routine labs have been drawn with HD and anemia of chronic disease has been managed with aranesp.  Hgb is stable at 9.6.  She has been tolerating coumadin without side effects and INR is 2.21 today.  Recommend 7.5 mg alternating with 5 mg every other day. Protimes to be drawn with HD and coumadin to be managed by SNF pharmacy after discharge.  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 Read Drivers for Botox after discharge.  She has been making slow steady progress and requires supervision at discharge. Patient and family has elected on SNF for further therapies after discharge. She was discharged to [facility] on 02/25/15.    Rehab course: During patient's stay in rehab weekly team conferences were held to monitor patient's progress, set goals and discuss barriers to discharge. At admission, patient required total assist with mobility and min assist with basic self care tasks. She has had improvement in activity tolerance, balance, postural control, as well as ability to compensate for deficits. She has made steady progress and requires supervision with LB dressing, toileting and toilet transfers. She is ambulating 62' with RW and supervision and requires min assists to climb 4 stairs. Family education not done as patient discharging to SNF.     Disposition:  Skilled Nursing Facility  Diet: Renal diet with CM restrictions. 1200 cc FR/daily  Special Instructions: 1. Check bloods sugars ac/hs and titrate insulin as indicated. 2. Pharmacy to manage coumadin with INR goal 2-3.        Discharge Instructions    Ambulatory referral to Physical Medicine Rehab    Complete by:  As directed   Needs follow appointment in one month for injection of R-FPL due to post stroke spasticity.  She is going to nursing home for about 2 weeks and will likely resume TTS dialysis schedule after discharge.               Medication List    STOP taking these medications        celecoxib 200 MG capsule  Commonly known as:  CELEBREX     diphenoxylate-atropine 2.5-0.025 MG tablet  Commonly known as:  LOMOTIL     insulin aspart 100 UNIT/ML injection  Commonly known as:  novoLOG     lisinopril 20 MG tablet  Commonly known as:  PRINIVIL,ZESTRIL     lisinopril 30 MG tablet  Commonly known as:  PRINIVIL,ZESTRIL      silver sulfADIAZINE 1 % cream  Commonly known as:  SILVADENE     TOUJEO SOLOSTAR [place]  Replaced by:  insulin glargine 100 UNIT/ML injection     traMADol 50 MG tablet  Commonly known as:  ULTRAM      TAKE these medications        acetaminophen 325 MG tablet  Commonly known as:  TYLENOL  Take 1-2 tablets (325-650 mg total) by mouth every 4 (four) hours as needed for mild pain.     calcitRIOL 0.5 MCG capsule  Commonly known as:  ROCALTROL  Take 3 capsules (1.5 mcg total) by mouth Every Tuesday,Thursday,and Saturday with dialysis.     carvedilol 3.125 MG tablet  Commonly known as:  COREG  Take 1 tablet (3.125 mg total) by mouth 2 (two) times daily with a meal.     Darbepoetin Alfa 100 MCG/0.5ML Sosy injection  Commonly known as:  ARANESP  Inject 0.5 mLs (  100 mcg total) into the vein every Tuesday with hemodialysis.     ELIPHOS 667 MG tablet  Generic drug:  calcium acetate  Take 667 mg by mouth 3 (three) times daily with meals. Take 3 capsule in the morning, 2 capsules with a snack and 3 capsules in the evening.     folic acid-vitamin b complex-vitamin c-selenium-zinc 3 MG Tabs tablet  Take 1 tablet by mouth daily.     gabapentin 300 MG capsule  Commonly known as:  NEURONTIN  Take 300 mg by mouth at bedtime.     guaiFENesin 600 MG 12 hr tablet  Commonly known as:  MUCINEX  Take 1 tablet (600 mg total) by mouth 2 (two) times daily as needed for cough.     insulin glargine 100 UNIT/ML injection  Commonly known as:  LANTUS  Inject 0.1 mLs (10 Units total) into the skin at bedtime.     ketoconazole 2 % cream  Commonly known as:  NIZORAL  Apply 1 application topically daily.     lidocaine-prilocaine cream  Commonly known as:  EMLA  Apply 1 application topically as needed (topical anesthesia for hemodialysis if Gebauers and Lidocaine injection are ineffective.).     multivitamin Tabs tablet  Take 1 tablet by mouth at bedtime.     MUSCLE RUB 10-15 % Crea  Apply 1  application topically 2 (two) times daily as needed for muscle pain.     nitroGLYCERIN 0.4 MG SL tablet  Commonly known as:  NITROSTAT  Place 1 tablet (0.4 mg total) under the tongue every 5 (five) minutes as needed for chest pain.     omeprazole 40 MG capsule  Commonly known as:  PRILOSEC  Take 40 mg by mouth daily.     polycarbophil 625 MG tablet  Commonly known as:  FIBERCON  Take 2 tablets (1,250 mg total) by mouth daily.     polyethylene glycol packet  Commonly known as:  MIRALAX / GLYCOLAX  Take 17 g by mouth daily as needed.     pravastatin 40 MG tablet  Commonly known as:  PRAVACHOL  Take 40 mg by mouth daily.     PROAIR HFA 108 (90 BASE) MCG/ACT inhaler  Generic drug:  albuterol  Inhale 1 puff into the lungs every 4 (four) hours as needed for wheezing or shortness of breath.     rOPINIRole 0.5 MG tablet  Commonly known as:  REQUIP  Take 0.5 mg by mouth at bedtime.     saccharomyces boulardii 250 MG capsule  Commonly known as:  FLORASTOR  Take 1 capsule (250 mg total) by mouth 2 (two) times daily.     SENSIPAR 90 MG tablet  Generic drug:  cinacalcet  Take 90 mg by mouth daily.     VOLTAREN 1 % Gel  Generic drug:  diclofenac sodium  APPLY 2 GRAMS TO AREA UP TO 4 TIMES A DAY     warfarin 5 MG tablet  Commonly known as:  COUMADIN  Take 7.5 mg today with supper. Alternate 5 mg with 7.5 every other day.     XALATAN 0.005 % ophthalmic solution  Generic drug:  latanoprost  Place 1 drop into the left eye at bedtime.       Follow-up Information    Follow up with Charlett Blake, MD.   Specialty:  Physical Medicine and Rehabilitation   Why:  office to call you with appointment date   Contact information:   [address]  [place] [address] [phone]       Call Ruta Hinds, MD.   Specialties:  Vascular Surgery, Cardiology   Why:  for follow up appointment.    Contact information:   [address] [place] [address] [phone]        Call Nahser, Wonda Cheng, MD.   Specialty:  Cardiology   Why:  for follow up appointment for further cardiac workup.    Contact information:   [address] [place] [address] [phone]       Signed: Bary Leriche 02/25/2015, 12:14 PM

## 2015-02-25 NOTE — Progress Notes (Signed)
Social Work  Discharge Note  The overall goal for the admission was met for:   Discharge location: No-[facility]-SNF  Length of Stay: No-17 DAYS  Discharge activity level: Yes-SUPERVISION/MIN LEVEL  Home/community participation: Yes  Services provided included: MD, RD, PT, OT, RN, CM, TR, Pharmacy, Neuropsych and SW  Financial Services: Medicare and Medicaid  Follow-up services arranged: Other: NHP  Comments (or additional information):PT NOT AT A LEVEL SHE CAN [place], THIS IS THE GOAL  Patient/Family verbalized understanding of follow-up arrangements: Yes  Individual responsible for coordination of the follow-up plan: PATIENT & FALECIA-DAUGHTER  Confirmed correct DME delivered: [name] 02/25/2015    [name]

## 2015-02-25 NOTE — Progress Notes (Signed)
  [facility] Progress Note   Subjective: stable  Filed Vitals:   02/24/15 0800 02/24/15 1300 02/24/15 1744 02/25/15 0644  BP: 133/62 161/53 157/58   Pulse: 60 61 63 55  Temp:  98.1 F (36.7 C)  97.4 F (36.3 C)  TempSrc:  Oral  Oral  Resp:  17  18  Weight:    76 kg (167 lb 8.8 oz)  SpO2:  100%  100%    Inpatient medications: . antiseptic oral rinse  7 mL Mouth Rinse BID  . calcitRIOL  1.5 mcg Oral Q T,Th,Sa-HD  . calcium acetate  667 mg Oral TID WC  . carvedilol  3.125 mg Oral BID WC  . cinacalcet  90 mg Oral Q breakfast  . darbepoetin (ARANESP) injection - DIALYSIS  100 mcg Intravenous Q Tue-HD  . gabapentin  300 mg Oral QHS  . heparin  4,000 Units Dialysis Once in dialysis  . insulin aspart  0-5 Units Subcutaneous QHS  . insulin aspart  0-9 Units Subcutaneous TID WC  . insulin glargine  10 Units Subcutaneous QHS  . ketoconazole  1 application Topical Daily  . latanoprost  1 drop Left Eye QHS  . multivitamin  1 tablet Oral QHS  . pantoprazole  40 mg Oral Daily  . polycarbophil  1,250 mg Oral Daily  . polyethylene glycol  17 g Oral Daily  . pravastatin  40 mg Oral Daily  . rOPINIRole  0.5 mg Oral QHS  . saccharomyces boulardii  250 mg Oral BID  . 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 For dc today   Kelly Splinter MD [facility] pager [phone]    cell [phone] 02/25/2015, 8:29 AM    Recent Labs Lab 02/19/15 1540 02/21/15 1545 02/23/15 2328  NA 132* 133* 133*  K 4.7 4.7 4.6  CL 93* 96* 95*  CO2 27 26 27   GLUCOSE 185* 141* 235*  BUN 40* 44* 51*  CREATININE 8.91* 8.22* 7.92*  CALCIUM 9.5 9.2 10.1  PHOS 4.5 4.1 3.9    Recent Labs Lab 02/19/15 1540 02/21/15 1545 02/23/15 2328  ALBUMIN 3.4* 3.1* 3.3*    Recent Labs Lab 02/23/15 2329 02/24/15 0453 02/25/15 0437  WBC 3.4* 3.3* 3.6*  HGB 9.9* 10.1* 9.6*  HCT 30.5* 31.3* 30.8*  MCV 102.0* 102.6* 102.7*  PLT 171 134* 149*

## 2015-02-25 NOTE — Progress Notes (Signed)
[facility]     PROGRESS NOTE    Subjective/Complaints: No issues ROS: Pt denies , nausea, vomiting, abdominal pain, diarrhea, chest pain, shortness of breath,  Objective: Vital Signs: Blood pressure 157/58, pulse 55, temperature 97.4 F (36.3 C), temperature source Oral, resp. rate 18, weight 76 kg (167 lb 8.8 oz), SpO2 100 %. No results found.  Recent Labs  02/24/15 0453 02/25/15 0437  WBC 3.3* 3.6*  HGB 10.1* 9.6*  HCT 31.3* 30.8*  PLT 134* 149*    Recent Labs  02/23/15 2328  NA 133*  K 4.6  CL 95*  GLUCOSE 235*  BUN 51*  CREATININE 7.92*  CALCIUM 10.1   CBG (last 3)   Recent Labs  02/24/15 1145 02/24/15 1642 02/24/15 2114  GLUCAP 132* 144* 179*    Wt Readings from Last 3 Encounters:  02/25/15 76 kg (167 lb 8.8 oz)  02/08/15 82.4 kg (181 lb 10.5 oz)  03/23/14 88.451 kg (195 lb)    Physical Exam:  Constitutional: She is oriented to person, place, and time. She appears well-developed and well-nourished.  HENT:  Head: Normocephalic and atraumatic.   Eyes: Conjunctivae are normal. Pupils are equal, round, and reactive to light.  Neck: Normal range of motion. Neck supple.  Cardiovascular: Normal rate and regular rhythm.  No murmur heard. Respiratory: Effort normal and breath sounds normal. No respiratory distress. She has no wheezes.  GI: Soft. She exhibits no distension. There is no tenderness.  Ext- Left fem AVG good thrill Neurological: She is alert and oriented to person, place, and time.  Speech dysarthric (edentulous) and able to follow basic commands without difficulty.   Psychiatric: She has a normal mood and affect. Her behavior is normal. Thought content normal.   Assessment/Plan: 1. Weakness and balance deficits secondary to multiple medical issues/debility  Stable for D/C today to SNF  F/u PM&R 3-4 weeks See D/C summary See D/C instructions.Function:  Bathing Bathing position   Position:  Shower  Bathing parts Body parts bathed by patient: Right arm, Left arm, Chest, Abdomen, Front perineal area, Right upper leg, Left upper leg, Buttocks, Right lower leg, Left lower leg Body parts bathed by helper: Back  Bathing assist Assist Level: Supervision or verbal cues   Set up : To obtain items  Upper Body Dressing/Undressing Upper body dressing   What is the patient wearing?: Pull over shirt/dress, Bra Bra - Perfomed by patient: Thread/unthread right bra strap, Thread/unthread left bra strap, Hook/unhook bra (pull down sports bra) Bra - Perfomed by helper: Hook/unhook bra (pull down sports bra) Pull over shirt/dress - Perfomed by patient: Thread/unthread right sleeve, Thread/unthread left sleeve, Put head through opening, Pull shirt over trunk          Upper body assist Assist Level: More than reasonable time   Set up : To obtain clothing/put away  Lower Body Dressing/Undressing Lower body dressing   What is the patient wearing?: Pants, Shoes, Underwear, Socks Underwear - Performed by patient: Thread/unthread right underwear leg, Thread/unthread left underwear leg, Pull underwear up/down Underwear - Performed by helper: Pull underwear up/down Pants- Performed by patient: Thread/unthread right pants leg, Thread/unthread left pants leg, Pull pants up/down Pants- Performed by helper: Thread/unthread left pants leg, Thread/unthread right pants leg Non-skid slipper socks- Performed by patient: Don/doff right sock, Don/doff left sock Non-skid slipper socks- Performed by helper: Don/doff right sock, Don/doff left sock Socks - Performed by patient: Don/doff right sock, Don/doff left sock   Shoes -  Performed by patient: Don/doff right shoe, Don/doff left shoe, Fasten right, Fasten left Shoes - Performed by helper: Don/doff right shoe, Don/doff left shoe          Lower body assist Assist for lower body dressing: Supervision or verbal cues, Set up   Set up : To obtain clothing/put  away  Toileting Toileting   Toileting steps completed by patient: Adjust clothing prior to toileting, Adjust clothing after toileting, Performs perineal hygiene Toileting steps completed by helper: Adjust clothing after toileting Toileting Assistive Devices: Grab bar or rail  Toileting assist Assist level: Supervision or verbal cues   Transfers Chair/bed transfer   Chair/bed transfer method: Stand pivot, Ambulatory Chair/bed transfer assist level: Supervision or verbal cues Chair/bed transfer assistive device: Walker, Air cabin crew     Max distance: 175 Assist level: Supervision or verbal cues   Wheelchair   Type: Manual Max wheelchair distance: 100' Assist Level: Supervision or verbal cues  Cognition Comprehension Comprehension assist level: Understands complex 90% of the time/cues 10% of the time  Expression Expression assist level: Expresses complex 90% of the time/cues < 10% of the time  Social Interaction Social Interaction assist level: Interacts appropriately with others - No medications needed.  Problem Solving Problem solving assist level: Solves basic 90% of the time/requires cueing < 10% of the time  Memory Memory assist level: Recognizes or recalls 90% of the time/requires cueing < 10% of the time   Medical Problem List and Plan: 1. 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 LLE DVT Prophylaxis/Anticoagulation: Pharmaceutical: Coumadin therapeutic, no signs of bleeding 3. Pain Management: ankle pain on left likely due to DVT 4. Mood: LCSW to follow up for evaluation and support.  5. Neuropsych: This patient is capable of making decisions on her own behalf. 6. Skin/Wound Care: Routine pressure relief measures 7. Fluids/Electrolytes/Nutrition: Continue 1200 cc FR daily.  8. CAD: Treat medically and monitor for symptoms/tolerance with  increase in activity. 9. HTN: monitor BP bid on lisinopril and coreg. Adjusting per Nephro 10. ESRD: Appreciate Nephro notes. Convert HD T,Th, Sa to M,W,F  -AVG site without further bleeding,    11. DM type 2: Used novolog bid prn at home. May benefit from scheduled insulin dosing. Will follow for pattern and Continue to monitor BS ac/hs and use SSI for elevated BS.  -HS lantus, , 12/8 72 , reduce dose- 134 today 12. Anemia of chronic disease: On Aranesp. Monitor and transfuse prn symptoms or Hgb less than 7.0--hgb gradually improving,  9.5 13. Thrombocytopenia: Monitoring closely with heparin on board.149K on 12/12 14. PVD: Off plavix ,multiple stents left iliac vein. --now on warfarin,  15. 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

## 2015-02-25 NOTE — Progress Notes (Signed)
Patient discharged to [facility].  Patient left floor via stretcher, escorted by [employer].  All patient belongings sent with patient.  Report previously called to Lelon Frohlich, Therapist, sports.  Appears to be in no immediate distress at this time.  Brita Romp, RN

## 2015-02-26 DIAGNOSIS — N186 End stage renal disease: Secondary | ICD-10-CM | POA: Diagnosis not present

## 2015-02-26 DIAGNOSIS — Z992 Dependence on renal dialysis: Secondary | ICD-10-CM | POA: Diagnosis not present

## 2015-02-26 DIAGNOSIS — E1129 Type 2 diabetes mellitus with other diabetic kidney complication: Secondary | ICD-10-CM | POA: Diagnosis not present

## 2015-02-26 NOTE — Progress Notes (Signed)
Physical Therapy Note  Patient Details  Name: Stefanie [name] MRN: [id number] Date of Birth: [birth year] Today's Date: 02/26/2015    Late entry: Pt not seen for 60 minute treatment session 11/29 due to change in schedule for pt to receive dialysis.     Benjiman Core Tygielski 02/26/2015, 12:57 PM

## 2015-02-27 ENCOUNTER — Encounter: Payer: Self-pay | Admitting: Cardiovascular Disease

## 2015-02-27 DIAGNOSIS — E1129 Type 2 diabetes mellitus with other diabetic kidney complication: Secondary | ICD-10-CM | POA: Diagnosis not present

## 2015-02-27 DIAGNOSIS — D631 Anemia in chronic kidney disease: Secondary | ICD-10-CM | POA: Diagnosis not present

## 2015-02-27 DIAGNOSIS — N2581 Secondary hyperparathyroidism of renal origin: Secondary | ICD-10-CM | POA: Diagnosis not present

## 2015-02-27 DIAGNOSIS — N186 End stage renal disease: Secondary | ICD-10-CM | POA: Diagnosis not present

## 2015-02-28 DIAGNOSIS — Z7901 Long term (current) use of anticoagulants: Secondary | ICD-10-CM | POA: Diagnosis not present

## 2015-03-01 DIAGNOSIS — N186 End stage renal disease: Secondary | ICD-10-CM | POA: Diagnosis not present

## 2015-03-01 DIAGNOSIS — N2581 Secondary hyperparathyroidism of renal origin: Secondary | ICD-10-CM | POA: Diagnosis not present

## 2015-03-01 DIAGNOSIS — D631 Anemia in chronic kidney disease: Secondary | ICD-10-CM | POA: Diagnosis not present

## 2015-03-01 DIAGNOSIS — E1129 Type 2 diabetes mellitus with other diabetic kidney complication: Secondary | ICD-10-CM | POA: Diagnosis not present

## 2015-03-04 DIAGNOSIS — D631 Anemia in chronic kidney disease: Secondary | ICD-10-CM | POA: Diagnosis not present

## 2015-03-04 DIAGNOSIS — N186 End stage renal disease: Secondary | ICD-10-CM | POA: Diagnosis not present

## 2015-03-04 DIAGNOSIS — N2581 Secondary hyperparathyroidism of renal origin: Secondary | ICD-10-CM | POA: Diagnosis not present

## 2015-03-04 DIAGNOSIS — E1129 Type 2 diabetes mellitus with other diabetic kidney complication: Secondary | ICD-10-CM | POA: Diagnosis not present

## 2015-03-05 DIAGNOSIS — E119 Type 2 diabetes mellitus without complications: Secondary | ICD-10-CM | POA: Diagnosis not present

## 2015-03-05 DIAGNOSIS — Z79899 Other long term (current) drug therapy: Secondary | ICD-10-CM | POA: Diagnosis not present

## 2015-03-06 ENCOUNTER — Emergency Department (HOSPITAL_COMMUNITY)
Admission: EM | Admit: 2015-03-06 | Discharge: 2015-03-06 | Disposition: A | Discharge status: Home / Self Care | Payer: Medicare Other | Attending: Emergency Medicine | Admitting: Emergency Medicine

## 2015-03-06 DIAGNOSIS — E785 Hyperlipidemia, unspecified: Secondary | ICD-10-CM | POA: Insufficient documentation

## 2015-03-06 DIAGNOSIS — Y829 Unspecified medical devices associated with adverse incidents: Secondary | ICD-10-CM | POA: Insufficient documentation

## 2015-03-06 DIAGNOSIS — I509 Heart failure, unspecified: Secondary | ICD-10-CM | POA: Insufficient documentation

## 2015-03-06 DIAGNOSIS — E211 Secondary hyperparathyroidism, not elsewhere classified: Secondary | ICD-10-CM | POA: Insufficient documentation

## 2015-03-06 DIAGNOSIS — Z9889 Other specified postprocedural states: Secondary | ICD-10-CM | POA: Insufficient documentation

## 2015-03-06 DIAGNOSIS — I2510 Atherosclerotic heart disease of native coronary artery without angina pectoris: Secondary | ICD-10-CM | POA: Insufficient documentation

## 2015-03-06 DIAGNOSIS — Z7901 Long term (current) use of anticoagulants: Secondary | ICD-10-CM | POA: Insufficient documentation

## 2015-03-06 DIAGNOSIS — D631 Anemia in chronic kidney disease: Secondary | ICD-10-CM | POA: Diagnosis not present

## 2015-03-06 DIAGNOSIS — Z8669 Personal history of other diseases of the nervous system and sense organs: Secondary | ICD-10-CM | POA: Insufficient documentation

## 2015-03-06 DIAGNOSIS — Z79899 Other long term (current) drug therapy: Secondary | ICD-10-CM | POA: Insufficient documentation

## 2015-03-06 DIAGNOSIS — Z791 Long term (current) use of non-steroidal anti-inflammatories (NSAID): Secondary | ICD-10-CM | POA: Insufficient documentation

## 2015-03-06 DIAGNOSIS — T829XXA Unspecified complication of cardiac and vascular prosthetic device, implant and graft, initial encounter: Secondary | ICD-10-CM | POA: Diagnosis not present

## 2015-03-06 DIAGNOSIS — T82838A Hemorrhage of vascular prosthetic devices, implants and grafts, initial encounter: Secondary | ICD-10-CM | POA: Insufficient documentation

## 2015-03-06 DIAGNOSIS — I120 Hypertensive chronic kidney disease with stage 5 chronic kidney disease or end stage renal disease: Secondary | ICD-10-CM | POA: Insufficient documentation

## 2015-03-06 DIAGNOSIS — Z8673 Personal history of transient ischemic attack (TIA), and cerebral infarction without residual deficits: Secondary | ICD-10-CM | POA: Insufficient documentation

## 2015-03-06 DIAGNOSIS — N186 End stage renal disease: Secondary | ICD-10-CM | POA: Diagnosis not present

## 2015-03-06 DIAGNOSIS — D649 Anemia, unspecified: Secondary | ICD-10-CM | POA: Insufficient documentation

## 2015-03-06 DIAGNOSIS — Z794 Long term (current) use of insulin: Secondary | ICD-10-CM | POA: Insufficient documentation

## 2015-03-06 DIAGNOSIS — E1129 Type 2 diabetes mellitus with other diabetic kidney complication: Secondary | ICD-10-CM | POA: Diagnosis not present

## 2015-03-06 DIAGNOSIS — N2581 Secondary hyperparathyroidism of renal origin: Secondary | ICD-10-CM | POA: Diagnosis not present

## 2015-03-06 DIAGNOSIS — E119 Type 2 diabetes mellitus without complications: Secondary | ICD-10-CM | POA: Insufficient documentation

## 2015-03-06 DIAGNOSIS — Z87891 Personal history of nicotine dependence: Secondary | ICD-10-CM | POA: Insufficient documentation

## 2015-03-06 DIAGNOSIS — Z9221 Personal history of antineoplastic chemotherapy: Secondary | ICD-10-CM | POA: Insufficient documentation

## 2015-03-06 DIAGNOSIS — S81812A Laceration without foreign body, left lower leg, initial encounter: Secondary | ICD-10-CM | POA: Diagnosis not present

## 2015-03-06 LAB — CBC WITH DIFFERENTIAL/PLATELET
BASOS PCT: 1 %
Basophils Absolute: 0 10*3/uL (ref 0.0–0.1)
EOS ABS: 0.1 10*3/uL (ref 0.0–0.7)
Eosinophils Relative: 4 %
HEMATOCRIT: 34.9 % — AB (ref 36.0–46.0)
Hemoglobin: 10.8 g/dL — ABNORMAL LOW (ref 12.0–15.0)
Lymphocytes Relative: 25 %
Lymphs Abs: 0.8 10*3/uL (ref 0.7–4.0)
MCH: 31.7 pg (ref 26.0–34.0)
MCHC: 30.9 g/dL (ref 30.0–36.0)
MCV: 102.3 fL — ABNORMAL HIGH (ref 78.0–100.0)
MONO ABS: 0.5 10*3/uL (ref 0.1–1.0)
MONOS PCT: 13 %
NEUTROS ABS: 2 10*3/uL (ref 1.7–7.7)
Neutrophils Relative %: 57 %
Platelets: 152 10*3/uL (ref 150–400)
RBC: 3.41 MIL/uL — ABNORMAL LOW (ref 3.87–5.11)
RDW: 16.8 % — AB (ref 11.5–15.5)
WBC: 3.4 10*3/uL — ABNORMAL LOW (ref 4.0–10.5)

## 2015-03-06 LAB — BASIC METABOLIC PANEL
Anion gap: 14 (ref 5–15)
BUN: 20 mg/dL (ref 6–20)
CALCIUM: 9.8 mg/dL (ref 8.9–10.3)
CO2: 29 mmol/L (ref 22–32)
CREATININE: 5.27 mg/dL — AB (ref 0.44–1.00)
Chloride: 97 mmol/L — ABNORMAL LOW (ref 101–111)
GFR calc non Af Amer: 8 mL/min — ABNORMAL LOW (ref >60)
GFR, EST AFRICAN AMERICAN: 9 mL/min — AB (ref >60)
Glucose, Bld: 178 mg/dL — ABNORMAL HIGH (ref 65–99)
Potassium: 4.1 mmol/L (ref 3.5–5.1)
Sodium: 140 mmol/L (ref 135–145)

## 2015-03-06 LAB — PROTIME-INR
INR: 2.31 — AB (ref 0.00–1.49)
Prothrombin Time: 25.1 seconds — ABNORMAL HIGH (ref 11.6–15.2)

## 2015-03-06 MED ORDER — LIDOCAINE-EPINEPHRINE (PF) 2 %-1:200000 IJ SOLN
20.0000 mL | Freq: Once | INTRAMUSCULAR | Status: AC
  Administered 2015-03-06: 20 mL via INTRADERMAL
  Filled 2015-03-06: qty 20

## 2015-03-06 NOTE — ED Notes (Signed)
Provided patient with [place] sandwich and drink per PA. Patient to call family members for ride home.

## 2015-03-06 NOTE — ED Notes (Addendum)
Per EMS: pt from dialysis for eval of bleeding fistula x2 hours, EMS cannot give estimation of blood loss since bleeding started. Pt also noted to have dialysis catheter accessed to left thigh. Pt alert and oriented, nad noted. NP at bedside. Pt does take coumadin for blood thinner.

## 2015-03-06 NOTE — ED Notes (Signed)
IV team at bedside 

## 2015-03-06 NOTE — ED Provider Notes (Signed)
CSN: [id number]     Arrival date & time 03/06/15  1731 History   First MD Initiated Contact with Patient 03/06/15 1734     Chief Complaint  Patient presents with  . Vascular Access Problem     (Consider location/radiation/quality/duration/timing/severity/associated sxs/prior Treatment) HPI Comments: Pt had dailysis today.  Pt has a shunt/graft left leg.  When dialysis needle was removed pt began bleeding.  Pt had pressure applied for over 2 hours.  Paramedic holding pressure on arrival.  When pressure is removed, pt has large amount of bleeding, (ceiling high)  Pt reports graft is about 69 years old. Pt is on coumadin because she had a clot in her leg.  Pt has dialsis mwf.   The history is provided by the patient. No language interpreter was used.    Past Medical History  Diagnosis Date  . Hypertension   . Hyperlipidemia   . Diabetes mellitus   . CVA (cerebral vascular accident) ([place]) [place]    left sided weakness  . Protein malnutrition ([place])   . Secondary hyperparathyroidism ([place])   . Anemia   . End stage renal disease ([place])     initiated HD 2006  . Hx of echocardiogram     a. Echo 03/2010: EF 55-60%, Gr 1 diast dysfn, trivial MS, mild to mod LAE, PASP 34, ;  b. Echo 11/13: mild LVH, EF 60%, Gr 1 diast dysfn, Ao sclerosis, no AS, MAC, slight MS, mean 3 mmHg, PASP 34  . Hx of cardiovascular stress test     a. MV 03/2010:  no ischemia, EF 55%  . CHF (congestive heart failure) ([place])   . Retinal detachment, bilateral   . CAD (coronary artery disease)    Past Surgical History  Procedure Laterality Date  . Cataract extraction    . Tubal ligation    . Arteriovenous graft placement      BUE numerous times  . Arteriovenous graft placement  11/13/10    Lt femoral - Dr. Kellie Simmering  . Dg av dialysis graft declot or  06/06/11, 08/26/11    performed in IR  . Shuntogram N/A 05/29/2013    Procedure: Earney Mallet;  Surgeon: Conrad Shonto, MD;  Location: [facility] CATH LAB;  Service: Cardiovascular;   Laterality: N/A;  . Shuntogram Left 03/01/2014    Procedure: SHUNTOGRAM;  Surgeon: Conrad Grand View, MD;  Location: [facility] CATH LAB;  Service: Cardiovascular;  Laterality: Left;  . Cardiac catheterization N/A 02/05/2015    Procedure: Left Heart Cath and Coronary Angiography;  Surgeon: Jettie Booze, MD;  Location: [facility] CV LAB;  Service: Cardiovascular;  Laterality: N/A;  . Peripheral vascular catheterization N/A 02/06/2015    Procedure: A/V Shuntogram;  Surgeon: Elam Dutch, MD;  Location: [facility] CV LAB;  Service: Cardiovascular;  Laterality: N/A;   Family History  Problem Relation Age of Onset  . Cardiomyopathy    . Heart disease Mother   . Diabetes Mother   . Hypertension Mother   . Heart attack Mother   . Diabetes Other   . Kidney disease Other   . Diabetes Daughter   . Diabetes Son   . Hypertension Son    Social History  Substance Use Topics  . Smoking status: Former Smoker    Quit date: 03/17/1983  . Smokeless tobacco: Never Used  . Alcohol Use: No   OB History    No data available     Review of Systems  All other systems reviewed and are negative.  Allergies  Review of patient's allergies indicates no known allergies.  Home Medications   Prior to Admission medications   Medication Sig Start Date End Date Taking? Authorizing Provider  acetaminophen (TYLENOL) 325 MG tablet Take 1-2 tablets (325-650 mg total) by mouth every 4 (four) hours as needed for mild pain. 02/25/15   Bary Leriche, PA-C  calcitRIOL (ROCALTROL) 0.5 MCG capsule Take 3 capsules (1.5 mcg total) by mouth Every Tuesday,Thursday,and Saturday with dialysis. 02/25/15   Bary Leriche, PA-C  calcium acetate (ELIPHOS) 667 MG tablet Take 667 mg by mouth 3 (three) times daily with meals. Take 3 capsule in the morning, 2 capsules with a snack and 3 capsules in the evening.    Historical Provider, MD  carvedilol (COREG) 3.125 MG tablet Take 1 tablet (3.125 mg total) by mouth 2 (two) times  daily with a meal. 02/25/15   Bary Leriche, PA-C  cinacalcet (SENSIPAR) 90 MG tablet Take 90 mg by mouth daily.    Historical Provider, MD  Darbepoetin Alfa (ARANESP) 100 MCG/0.5ML SOSY injection Inject 0.5 mLs (100 mcg total) into the vein every Tuesday with hemodialysis. 02/25/15   Bary Leriche, PA-C  folic acid-vitamin b complex-vitamin c-selenium-zinc (DIALYVITE) 3 MG TABS tablet Take 1 tablet by mouth daily.    Historical Provider, MD  gabapentin (NEURONTIN) 300 MG capsule Take 300 mg by mouth at bedtime.    Historical Provider, MD  guaiFENesin (MUCINEX) 600 MG 12 hr tablet Take 1 tablet (600 mg total) by mouth 2 (two) times daily as needed for cough. 02/25/15   Ivan Anchors Love, PA-C  insulin glargine (LANTUS) 100 UNIT/ML injection Inject 0.1 mLs (10 Units total) into the skin at bedtime. 02/25/15   Ivan Anchors Love, PA-C  ketoconazole (NIZORAL) 2 % cream Apply 1 application topically daily. 01/17/15   Titorya Stover, DPM  latanoprost (XALATAN) 0.005 % ophthalmic solution Place 1 drop into the left eye at bedtime.    Historical Provider, MD  lidocaine-prilocaine (EMLA) cream Apply 1 application topically as needed (topical anesthesia for hemodialysis if Gebauers and Lidocaine injection are ineffective.). 02/25/15   Ivan Anchors Love, PA-C  Menthol-Methyl Salicylate (MUSCLE RUB) 10-15 % CREA Apply 1 application topically 2 (two) times daily as needed for muscle pain. 02/25/15   Bary Leriche, PA-C  multivitamin (RENA-VIT) TABS tablet Take 1 tablet by mouth at bedtime. 02/25/15   Bary Leriche, PA-C  nitroGLYCERIN (NITROSTAT) 0.4 MG SL tablet Place 1 tablet (0.4 mg total) under the tongue every 5 (five) minutes as needed for chest pain. 02/25/15   Bary Leriche, PA-C  omeprazole (PRILOSEC) 40 MG capsule Take 40 mg by mouth daily.    Historical Provider, MD  polycarbophil (FIBERCON) 625 MG tablet Take 2 tablets (1,250 mg total) by mouth daily. 02/25/15   Ivan Anchors Love, PA-C  polyethylene glycol (MIRALAX /  GLYCOLAX) packet Take 17 g by mouth daily as needed. 02/25/15   Ivan Anchors Love, PA-C  pravastatin (PRAVACHOL) 40 MG tablet Take 40 mg by mouth daily.  02/11/14   Historical Provider, MD  PROAIR HFA 108 (90 BASE) MCG/ACT inhaler Inhale 1 puff into the lungs every 4 (four) hours as needed for wheezing or shortness of breath.  01/13/14   Historical Provider, MD  rOPINIRole (REQUIP) 0.5 MG tablet Take 0.5 mg by mouth at bedtime. 07/27/14   Historical Provider, MD  saccharomyces boulardii (FLORASTOR) 250 MG capsule Take 1 capsule (250 mg total) by mouth 2 (two) times daily. 02/25/15  Ivan Anchors Love, PA-C  VOLTAREN 1 % GEL APPLY 2 GRAMS TO AREA UP TO 4 TIMES A DAY 09/25/14   Historical Provider, MD  warfarin (COUMADIN) 5 MG tablet Take 7.5 mg today with supper. Alternate 5 mg with 7.5 every other day. 02/25/15   Ivan Anchors Love, PA-C   BP 144/109 mmHg  Pulse 60  Temp(Src) 97.8 F (36.6 C) (Oral)  Resp 18  SpO2 100% Physical Exam  Constitutional: She is oriented to person, place, and time. She appears well-developed and well-nourished.  HENT:  Head: Normocephalic and atraumatic.  Eyes: EOM are normal. Pupils are equal, round, and reactive to light.  Neck: Normal range of motion.  Cardiovascular: Normal rate and normal heart sounds.   Pulmonary/Chest: Effort normal.  Abdominal: She exhibits no distension.  Musculoskeletal:  Puncture wound left leg,   Neurological: She is alert and oriented to person, place, and time.  Skin: Skin is warm. There is erythema.  Psychiatric: She has a normal mood and affect.  Nursing note and vitals reviewed.   ED Course  .Marland KitchenLaceration Repair Date/Time: 03/06/2015 10:55 PM Performed by: Fransico Meadow Authorized by: Fransico Meadow Consent: Verbal consent not obtained. Risks and benefits: risks, benefits and alternatives were discussed Consent given by: patient Required items: required blood products, implants, devices, and special equipment  available Laceration length: 0.3 cm Vascular damage: yes Preparation: Patient was prepped and draped in the usual sterile fashion. Skin closure: 4-0 Prolene Number of sutures: 1 Suture technique: pursestring. Approximation: close Patient tolerance: Patient tolerated the procedure well with no immediate complications   (including critical care time) Labs Review Labs Reviewed  CBC WITH DIFFERENTIAL/PLATELET - Abnormal; Notable for the following:    WBC 3.4 (*)    RBC 3.41 (*)    Hemoglobin 10.8 (*)    HCT 34.9 (*)    MCV 102.3 (*)    RDW 16.8 (*)    All other components within normal limits  BASIC METABOLIC PANEL - Abnormal; Notable for the following:    Chloride 97 (*)    Glucose, Bld 178 (*)    Creatinine, Ser 5.27 (*)    GFR calc non Af Amer 8 (*)    GFR calc Af Amer 9 (*)    All other components within normal limits  PROTIME-INR - Abnormal; Notable for the following:    Prothrombin Time 25.1 (*)    INR 2.31 (*)    All other components within normal limits    Imaging Review No results found. I have personally reviewed and evaluated these images and lab results as part of my medical decision-making.   EKG Interpretation None      MDM Bleeding controlled, Pt observed extended period.  Venous access needle removed by IV team.   I spoke to Dr. Oneida Alar who will see pt in the office at 9:00am tomorrow.   Final diagnoses:  Renal dialysis device, implant, or graft complication        Fransico Meadow, PA-C 03/06/15 2257  Leo Grosser, MD 03/07/15 [phone]

## 2015-03-06 NOTE — Discharge Instructions (Signed)
Dialysis Vascular Access Malfunction °A vascular access is an entrance to your blood vessels that can be used for dialysis. A vascular access can be made in one of several ways:  °· Joining an artery to a vein under your skin to make a bigger blood vessel called a fistula.   °· Joining an artery to a vein under your skin using a soft tube called a graft.   °· Placing a thin, flexible tube (catheter) in a large vein, usually in your neck.   °A vascular access may malfunction or become blocked.  °WHAT CAN CAUSE YOUR VASCULAR ACCESS TO MALFUNCTION? °· Infection (common).   °· A blood clot inside a part of the fistula, graft, or catheter. A blood clot can completely or partially block the flow of blood.   °· A kink in the graft or catheter.   °· A collection of blood (called a hematoma or bruise) next to the graft or catheter that pushes against it, blocking the flow of blood.   °WHAT ARE SIGNS AND SYMPTOMS OF VASCULAR ACCESS MALFUNCTION? °· There is a change in the vibration or pulse of your fistula or graft. °· The vibration or pulse of your fistula or graft is gone.   °· There is new or unusual swelling of the area around the access.   °· There was an unsuccessful puncture of your access by the dialysis team.   °· The flow of blood through the fistula, graft, or catheter is too slow for effective dialysis.   °· When routine dialysis is completed and the needle is removed, bleeding lasts for too long a time.   °WHAT HAPPENS IF MY VASCULAR ACCESS MALFUNCTIONS? °Your health care provider may order blood work, cultures, or an X-ray test in order to learn what may be wrong with your vascular access. The X-ray test involves the injection of a liquid into the vascular access. The liquid shows up on the X-ray and allows your health care provider to see if there is a blockage in the vascular access.  °Treatment varies depending on the cause of the malfunction:   °· If the vascular access is infected, your health care provider  may prescribe antibiotic medicine to control the infection.   °· If a clot is found in the vascular access, you may need surgery to remove the clot.   °· If a blockage in the vascular access is due to some other cause (such as a kink in a graft), then you will likely need surgery to unblock or replace the graft.   °HOME CARE INSTRUCTIONS: °Follow up with your surgeon or other health care provider if you were instructed to do so. This is very important. Any delay in follow-up could cause permanent dysfunction of the vascular access, which may be dangerous.  °SEEK MEDICAL CARE IF:  °· Fever develops.   °· Swelling and pain around the vascular access gets worse or new pain develops. °· Pain, numbness, or an unusual pale skin color develops in the hand on the side of your vascular access. °SEEK IMMEDIATE MEDICAL CARE IF: °Unusual bleeding develops at the location of the vascular access. °MAKE SURE YOU: °· Understand these instructions. °· Will watch your condition. °· Will get help right away if you are not doing well or get worse. °  °This information is not intended to replace advice given to you by your health care provider. Make sure you discuss any questions you have with your health care provider. °  °Document Released: 02/02/2006 Document Revised: 03/23/2014 Document Reviewed: 08/04/2012 °Elsevier Interactive Patient Education ©2016 [employer] ° °

## 2015-03-06 NOTE — ED Notes (Signed)
Patient verbalized understanding of discharge instructions and denies any further needs or questions at this time. Report given to [facility] and follow-up information explained to nurse. VS stable. [facility] called to take patient home.

## 2015-03-07 ENCOUNTER — Ambulatory Visit (INDEPENDENT_AMBULATORY_CARE_PROVIDER_SITE_OTHER): Payer: Medicare Other | Admitting: Vascular Surgery

## 2015-03-07 ENCOUNTER — Encounter: Payer: Self-pay | Admitting: Vascular Surgery

## 2015-03-07 VITALS — BP 136/66 | HR 61 | Temp 97.7°F | Resp 16 | Ht 64.0 in | Wt 169.0 lb

## 2015-03-07 DIAGNOSIS — T82590A Other mechanical complication of surgically created arteriovenous fistula, initial encounter: Secondary | ICD-10-CM

## 2015-03-07 DIAGNOSIS — I6350 Cerebral infarction due to unspecified occlusion or stenosis of unspecified cerebral artery: Secondary | ICD-10-CM

## 2015-03-07 DIAGNOSIS — T82510A Breakdown (mechanical) of surgically created arteriovenous fistula, initial encounter: Secondary | ICD-10-CM | POA: Diagnosis not present

## 2015-03-07 NOTE — Progress Notes (Signed)
Patient is a 69 year old female with a left thigh AV graft. She was seen for an episode of bleeding from this thigh graft after dialysis in the ER last evening. She has had no further bleeding. A suture was placed in the ER last evening.  Physical exam:  Filed Vitals:   03/07/15 0931  BP: 136/66  Pulse: 61  Temp: 97.7 F (36.5 C)  TempSrc: Oral  Resp: 16  Height: 5\' 4"  (1.626 m)  Weight: 169 lb (76.658 kg)  SpO2: 100%    Left thigh graft palpable thrill no ulceration of skin area of suture repair looks good  Assessment: Patent left thigh graft status post suture repair of bleeding  Plan: Avoid cannulation of this area for several weeks to allow for complete healing. The area was marked for the patient and her dialysis center. Patient will follow-up on as-needed basis.  Ruta Hinds, MD [facility] Office: [phone] Pager: [phone]

## 2015-03-08 DIAGNOSIS — D631 Anemia in chronic kidney disease: Secondary | ICD-10-CM | POA: Diagnosis not present

## 2015-03-08 DIAGNOSIS — N2581 Secondary hyperparathyroidism of renal origin: Secondary | ICD-10-CM | POA: Diagnosis not present

## 2015-03-08 DIAGNOSIS — N186 End stage renal disease: Secondary | ICD-10-CM | POA: Diagnosis not present

## 2015-03-08 DIAGNOSIS — E1129 Type 2 diabetes mellitus with other diabetic kidney complication: Secondary | ICD-10-CM | POA: Diagnosis not present

## 2015-03-11 DIAGNOSIS — N2581 Secondary hyperparathyroidism of renal origin: Secondary | ICD-10-CM | POA: Diagnosis not present

## 2015-03-11 DIAGNOSIS — E1129 Type 2 diabetes mellitus with other diabetic kidney complication: Secondary | ICD-10-CM | POA: Diagnosis not present

## 2015-03-11 DIAGNOSIS — N186 End stage renal disease: Secondary | ICD-10-CM | POA: Diagnosis not present

## 2015-03-11 DIAGNOSIS — D631 Anemia in chronic kidney disease: Secondary | ICD-10-CM | POA: Diagnosis not present

## 2015-03-14 DIAGNOSIS — N186 End stage renal disease: Secondary | ICD-10-CM | POA: Diagnosis not present

## 2015-03-14 DIAGNOSIS — I509 Heart failure, unspecified: Secondary | ICD-10-CM | POA: Diagnosis not present

## 2015-03-14 DIAGNOSIS — I82402 Acute embolism and thrombosis of unspecified deep veins of left lower extremity: Secondary | ICD-10-CM | POA: Diagnosis not present

## 2015-03-14 DIAGNOSIS — E1129 Type 2 diabetes mellitus with other diabetic kidney complication: Secondary | ICD-10-CM | POA: Diagnosis not present

## 2015-03-14 DIAGNOSIS — Z87891 Personal history of nicotine dependence: Secondary | ICD-10-CM | POA: Diagnosis not present

## 2015-03-14 DIAGNOSIS — N2581 Secondary hyperparathyroidism of renal origin: Secondary | ICD-10-CM | POA: Diagnosis not present

## 2015-03-14 DIAGNOSIS — E785 Hyperlipidemia, unspecified: Secondary | ICD-10-CM | POA: Diagnosis not present

## 2015-03-14 DIAGNOSIS — D631 Anemia in chronic kidney disease: Secondary | ICD-10-CM | POA: Diagnosis not present

## 2015-03-14 DIAGNOSIS — I69359 Hemiplegia and hemiparesis following cerebral infarction affecting unspecified side: Secondary | ICD-10-CM | POA: Diagnosis not present

## 2015-03-14 DIAGNOSIS — E1122 Type 2 diabetes mellitus with diabetic chronic kidney disease: Secondary | ICD-10-CM | POA: Diagnosis not present

## 2015-03-14 DIAGNOSIS — I132 Hypertensive heart and chronic kidney disease with heart failure and with stage 5 chronic kidney disease, or end stage renal disease: Secondary | ICD-10-CM | POA: Diagnosis not present

## 2015-03-14 DIAGNOSIS — Z992 Dependence on renal dialysis: Secondary | ICD-10-CM | POA: Diagnosis not present

## 2015-03-15 DIAGNOSIS — Z1231 Encounter for screening mammogram for malignant neoplasm of breast: Secondary | ICD-10-CM | POA: Diagnosis not present

## 2015-03-16 DIAGNOSIS — N186 End stage renal disease: Secondary | ICD-10-CM | POA: Diagnosis not present

## 2015-03-16 DIAGNOSIS — D631 Anemia in chronic kidney disease: Secondary | ICD-10-CM | POA: Diagnosis not present

## 2015-03-16 DIAGNOSIS — N2581 Secondary hyperparathyroidism of renal origin: Secondary | ICD-10-CM | POA: Diagnosis not present

## 2015-03-16 DIAGNOSIS — E1129 Type 2 diabetes mellitus with other diabetic kidney complication: Secondary | ICD-10-CM | POA: Diagnosis not present

## 2015-03-18 DIAGNOSIS — R0602 Shortness of breath: Secondary | ICD-10-CM | POA: Diagnosis not present

## 2015-03-18 DIAGNOSIS — I82419 Acute embolism and thrombosis of unspecified femoral vein: Secondary | ICD-10-CM | POA: Diagnosis not present

## 2015-03-18 DIAGNOSIS — I82412 Acute embolism and thrombosis of left femoral vein: Secondary | ICD-10-CM | POA: Diagnosis not present

## 2015-03-19 DIAGNOSIS — E1129 Type 2 diabetes mellitus with other diabetic kidney complication: Secondary | ICD-10-CM | POA: Diagnosis not present

## 2015-03-19 DIAGNOSIS — I82402 Acute embolism and thrombosis of unspecified deep veins of left lower extremity: Secondary | ICD-10-CM | POA: Diagnosis not present

## 2015-03-19 DIAGNOSIS — D631 Anemia in chronic kidney disease: Secondary | ICD-10-CM | POA: Diagnosis not present

## 2015-03-19 DIAGNOSIS — N2581 Secondary hyperparathyroidism of renal origin: Secondary | ICD-10-CM | POA: Diagnosis not present

## 2015-03-19 DIAGNOSIS — N186 End stage renal disease: Secondary | ICD-10-CM | POA: Diagnosis not present

## 2015-03-20 DIAGNOSIS — I82402 Acute embolism and thrombosis of unspecified deep veins of left lower extremity: Secondary | ICD-10-CM | POA: Diagnosis not present

## 2015-03-20 DIAGNOSIS — I132 Hypertensive heart and chronic kidney disease with heart failure and with stage 5 chronic kidney disease, or end stage renal disease: Secondary | ICD-10-CM | POA: Diagnosis not present

## 2015-03-20 DIAGNOSIS — E1122 Type 2 diabetes mellitus with diabetic chronic kidney disease: Secondary | ICD-10-CM | POA: Diagnosis not present

## 2015-03-20 DIAGNOSIS — N186 End stage renal disease: Secondary | ICD-10-CM | POA: Diagnosis not present

## 2015-03-20 DIAGNOSIS — I509 Heart failure, unspecified: Secondary | ICD-10-CM | POA: Diagnosis not present

## 2015-03-20 DIAGNOSIS — I69359 Hemiplegia and hemiparesis following cerebral infarction affecting unspecified side: Secondary | ICD-10-CM | POA: Diagnosis not present

## 2015-03-21 DIAGNOSIS — N2581 Secondary hyperparathyroidism of renal origin: Secondary | ICD-10-CM | POA: Diagnosis not present

## 2015-03-21 DIAGNOSIS — E1129 Type 2 diabetes mellitus with other diabetic kidney complication: Secondary | ICD-10-CM | POA: Diagnosis not present

## 2015-03-21 DIAGNOSIS — D631 Anemia in chronic kidney disease: Secondary | ICD-10-CM | POA: Diagnosis not present

## 2015-03-21 DIAGNOSIS — N186 End stage renal disease: Secondary | ICD-10-CM | POA: Diagnosis not present

## 2015-03-22 ENCOUNTER — Telehealth: Payer: Self-pay | Admitting: Nurse Practitioner

## 2015-03-22 ENCOUNTER — Encounter: Payer: Self-pay | Admitting: Vascular Surgery

## 2015-03-22 DIAGNOSIS — N186 End stage renal disease: Secondary | ICD-10-CM | POA: Diagnosis not present

## 2015-03-22 DIAGNOSIS — E1122 Type 2 diabetes mellitus with diabetic chronic kidney disease: Secondary | ICD-10-CM | POA: Diagnosis not present

## 2015-03-22 DIAGNOSIS — I132 Hypertensive heart and chronic kidney disease with heart failure and with stage 5 chronic kidney disease, or end stage renal disease: Secondary | ICD-10-CM | POA: Diagnosis not present

## 2015-03-22 DIAGNOSIS — I82402 Acute embolism and thrombosis of unspecified deep veins of left lower extremity: Secondary | ICD-10-CM | POA: Diagnosis not present

## 2015-03-22 DIAGNOSIS — I69359 Hemiplegia and hemiparesis following cerebral infarction affecting unspecified side: Secondary | ICD-10-CM | POA: Diagnosis not present

## 2015-03-22 DIAGNOSIS — I509 Heart failure, unspecified: Secondary | ICD-10-CM | POA: Diagnosis not present

## 2015-03-22 NOTE — Telephone Encounter (Signed)
Called patient's daughter because unable to reach patient on given number to reschedule Monday appointment due to inclement weather.  Daughter states she is out of town and will have to call back Monday to reschedule.  She states patient cannot come in Tuesday due to dialysis and does not have anyone else that can bring her later on Monday.  I advised her to call back Monday to reschedule.  She verbalized understanding and agreement.

## 2015-03-23 DIAGNOSIS — D631 Anemia in chronic kidney disease: Secondary | ICD-10-CM | POA: Diagnosis not present

## 2015-03-23 DIAGNOSIS — E1129 Type 2 diabetes mellitus with other diabetic kidney complication: Secondary | ICD-10-CM | POA: Diagnosis not present

## 2015-03-23 DIAGNOSIS — N2581 Secondary hyperparathyroidism of renal origin: Secondary | ICD-10-CM | POA: Diagnosis not present

## 2015-03-23 DIAGNOSIS — N186 End stage renal disease: Secondary | ICD-10-CM | POA: Diagnosis not present

## 2015-03-25 ENCOUNTER — Encounter: Payer: Medicare Other | Admitting: Cardiovascular Disease

## 2015-03-27 ENCOUNTER — Ambulatory Visit: Payer: Medicare Other | Admitting: Sports Medicine

## 2015-03-27 DIAGNOSIS — I82402 Acute embolism and thrombosis of unspecified deep veins of left lower extremity: Secondary | ICD-10-CM | POA: Diagnosis not present

## 2015-03-27 DIAGNOSIS — I69359 Hemiplegia and hemiparesis following cerebral infarction affecting unspecified side: Secondary | ICD-10-CM | POA: Diagnosis not present

## 2015-03-27 DIAGNOSIS — E1129 Type 2 diabetes mellitus with other diabetic kidney complication: Secondary | ICD-10-CM | POA: Diagnosis not present

## 2015-03-27 DIAGNOSIS — N2581 Secondary hyperparathyroidism of renal origin: Secondary | ICD-10-CM | POA: Diagnosis not present

## 2015-03-27 DIAGNOSIS — I509 Heart failure, unspecified: Secondary | ICD-10-CM | POA: Diagnosis not present

## 2015-03-27 DIAGNOSIS — N186 End stage renal disease: Secondary | ICD-10-CM | POA: Diagnosis not present

## 2015-03-27 DIAGNOSIS — D631 Anemia in chronic kidney disease: Secondary | ICD-10-CM | POA: Diagnosis not present

## 2015-03-27 DIAGNOSIS — I132 Hypertensive heart and chronic kidney disease with heart failure and with stage 5 chronic kidney disease, or end stage renal disease: Secondary | ICD-10-CM | POA: Diagnosis not present

## 2015-03-27 DIAGNOSIS — E1122 Type 2 diabetes mellitus with diabetic chronic kidney disease: Secondary | ICD-10-CM | POA: Diagnosis not present

## 2015-03-28 ENCOUNTER — Ambulatory Visit: Payer: Medicare Other | Admitting: Vascular Surgery

## 2015-03-28 DIAGNOSIS — N2581 Secondary hyperparathyroidism of renal origin: Secondary | ICD-10-CM | POA: Diagnosis not present

## 2015-03-28 DIAGNOSIS — D631 Anemia in chronic kidney disease: Secondary | ICD-10-CM | POA: Diagnosis not present

## 2015-03-28 DIAGNOSIS — N186 End stage renal disease: Secondary | ICD-10-CM | POA: Diagnosis not present

## 2015-03-28 DIAGNOSIS — E1129 Type 2 diabetes mellitus with other diabetic kidney complication: Secondary | ICD-10-CM | POA: Diagnosis not present

## 2015-03-29 DIAGNOSIS — M25561 Pain in right knee: Secondary | ICD-10-CM | POA: Diagnosis not present

## 2015-03-29 DIAGNOSIS — I69359 Hemiplegia and hemiparesis following cerebral infarction affecting unspecified side: Secondary | ICD-10-CM | POA: Diagnosis not present

## 2015-03-29 DIAGNOSIS — N186 End stage renal disease: Secondary | ICD-10-CM | POA: Diagnosis not present

## 2015-03-29 DIAGNOSIS — E119 Type 2 diabetes mellitus without complications: Secondary | ICD-10-CM | POA: Diagnosis not present

## 2015-03-29 DIAGNOSIS — I82402 Acute embolism and thrombosis of unspecified deep veins of left lower extremity: Secondary | ICD-10-CM | POA: Diagnosis not present

## 2015-03-29 DIAGNOSIS — G8929 Other chronic pain: Secondary | ICD-10-CM | POA: Diagnosis not present

## 2015-03-29 DIAGNOSIS — J00 Acute nasopharyngitis [common cold]: Secondary | ICD-10-CM | POA: Diagnosis not present

## 2015-03-29 DIAGNOSIS — E1122 Type 2 diabetes mellitus with diabetic chronic kidney disease: Secondary | ICD-10-CM | POA: Diagnosis not present

## 2015-03-29 DIAGNOSIS — I509 Heart failure, unspecified: Secondary | ICD-10-CM | POA: Diagnosis not present

## 2015-03-29 DIAGNOSIS — I5030 Unspecified diastolic (congestive) heart failure: Secondary | ICD-10-CM | POA: Diagnosis not present

## 2015-03-29 DIAGNOSIS — I132 Hypertensive heart and chronic kidney disease with heart failure and with stage 5 chronic kidney disease, or end stage renal disease: Secondary | ICD-10-CM | POA: Diagnosis not present

## 2015-03-30 DIAGNOSIS — D631 Anemia in chronic kidney disease: Secondary | ICD-10-CM | POA: Diagnosis not present

## 2015-03-30 DIAGNOSIS — E1129 Type 2 diabetes mellitus with other diabetic kidney complication: Secondary | ICD-10-CM | POA: Diagnosis not present

## 2015-03-30 DIAGNOSIS — N2581 Secondary hyperparathyroidism of renal origin: Secondary | ICD-10-CM | POA: Diagnosis not present

## 2015-03-30 DIAGNOSIS — N186 End stage renal disease: Secondary | ICD-10-CM | POA: Diagnosis not present

## 2015-04-01 DIAGNOSIS — M179 Osteoarthritis of knee, unspecified: Secondary | ICD-10-CM | POA: Diagnosis not present

## 2015-04-01 DIAGNOSIS — I82402 Acute embolism and thrombosis of unspecified deep veins of left lower extremity: Secondary | ICD-10-CM | POA: Diagnosis not present

## 2015-04-01 DIAGNOSIS — M25461 Effusion, right knee: Secondary | ICD-10-CM | POA: Diagnosis not present

## 2015-04-01 DIAGNOSIS — I69359 Hemiplegia and hemiparesis following cerebral infarction affecting unspecified side: Secondary | ICD-10-CM | POA: Diagnosis not present

## 2015-04-01 DIAGNOSIS — I509 Heart failure, unspecified: Secondary | ICD-10-CM | POA: Diagnosis not present

## 2015-04-01 DIAGNOSIS — G8929 Other chronic pain: Secondary | ICD-10-CM | POA: Diagnosis not present

## 2015-04-01 DIAGNOSIS — N186 End stage renal disease: Secondary | ICD-10-CM | POA: Diagnosis not present

## 2015-04-01 DIAGNOSIS — R05 Cough: Secondary | ICD-10-CM | POA: Diagnosis not present

## 2015-04-01 DIAGNOSIS — I132 Hypertensive heart and chronic kidney disease with heart failure and with stage 5 chronic kidney disease, or end stage renal disease: Secondary | ICD-10-CM | POA: Diagnosis not present

## 2015-04-01 DIAGNOSIS — M25561 Pain in right knee: Secondary | ICD-10-CM | POA: Diagnosis not present

## 2015-04-01 DIAGNOSIS — I5031 Acute diastolic (congestive) heart failure: Secondary | ICD-10-CM | POA: Diagnosis not present

## 2015-04-01 DIAGNOSIS — E1122 Type 2 diabetes mellitus with diabetic chronic kidney disease: Secondary | ICD-10-CM | POA: Diagnosis not present

## 2015-04-02 DIAGNOSIS — I82402 Acute embolism and thrombosis of unspecified deep veins of left lower extremity: Secondary | ICD-10-CM | POA: Diagnosis not present

## 2015-04-02 DIAGNOSIS — I132 Hypertensive heart and chronic kidney disease with heart failure and with stage 5 chronic kidney disease, or end stage renal disease: Secondary | ICD-10-CM | POA: Diagnosis not present

## 2015-04-02 DIAGNOSIS — N186 End stage renal disease: Secondary | ICD-10-CM | POA: Diagnosis not present

## 2015-04-02 DIAGNOSIS — I69359 Hemiplegia and hemiparesis following cerebral infarction affecting unspecified side: Secondary | ICD-10-CM | POA: Diagnosis not present

## 2015-04-02 DIAGNOSIS — N2581 Secondary hyperparathyroidism of renal origin: Secondary | ICD-10-CM | POA: Diagnosis not present

## 2015-04-02 DIAGNOSIS — E1129 Type 2 diabetes mellitus with other diabetic kidney complication: Secondary | ICD-10-CM | POA: Diagnosis not present

## 2015-04-02 DIAGNOSIS — I509 Heart failure, unspecified: Secondary | ICD-10-CM | POA: Diagnosis not present

## 2015-04-02 DIAGNOSIS — E1122 Type 2 diabetes mellitus with diabetic chronic kidney disease: Secondary | ICD-10-CM | POA: Diagnosis not present

## 2015-04-02 DIAGNOSIS — D631 Anemia in chronic kidney disease: Secondary | ICD-10-CM | POA: Diagnosis not present

## 2015-04-03 ENCOUNTER — Encounter: Payer: Self-pay | Admitting: Vascular Surgery

## 2015-04-03 DIAGNOSIS — I132 Hypertensive heart and chronic kidney disease with heart failure and with stage 5 chronic kidney disease, or end stage renal disease: Secondary | ICD-10-CM | POA: Diagnosis not present

## 2015-04-03 DIAGNOSIS — E1122 Type 2 diabetes mellitus with diabetic chronic kidney disease: Secondary | ICD-10-CM | POA: Diagnosis not present

## 2015-04-03 DIAGNOSIS — I69359 Hemiplegia and hemiparesis following cerebral infarction affecting unspecified side: Secondary | ICD-10-CM | POA: Diagnosis not present

## 2015-04-03 DIAGNOSIS — N186 End stage renal disease: Secondary | ICD-10-CM | POA: Diagnosis not present

## 2015-04-03 DIAGNOSIS — I82402 Acute embolism and thrombosis of unspecified deep veins of left lower extremity: Secondary | ICD-10-CM | POA: Diagnosis not present

## 2015-04-03 DIAGNOSIS — I509 Heart failure, unspecified: Secondary | ICD-10-CM | POA: Diagnosis not present

## 2015-04-04 ENCOUNTER — Encounter: Payer: Self-pay | Admitting: Sports Medicine

## 2015-04-04 ENCOUNTER — Ambulatory Visit (INDEPENDENT_AMBULATORY_CARE_PROVIDER_SITE_OTHER): Payer: Medicare Other | Admitting: Sports Medicine

## 2015-04-04 DIAGNOSIS — N186 End stage renal disease: Secondary | ICD-10-CM | POA: Diagnosis not present

## 2015-04-04 DIAGNOSIS — B351 Tinea unguium: Secondary | ICD-10-CM | POA: Diagnosis not present

## 2015-04-04 DIAGNOSIS — M79676 Pain in unspecified toe(s): Secondary | ICD-10-CM

## 2015-04-04 DIAGNOSIS — E1129 Type 2 diabetes mellitus with other diabetic kidney complication: Secondary | ICD-10-CM | POA: Diagnosis not present

## 2015-04-04 DIAGNOSIS — I739 Peripheral vascular disease, unspecified: Secondary | ICD-10-CM

## 2015-04-04 DIAGNOSIS — E1140 Type 2 diabetes mellitus with diabetic neuropathy, unspecified: Secondary | ICD-10-CM

## 2015-04-04 DIAGNOSIS — D631 Anemia in chronic kidney disease: Secondary | ICD-10-CM | POA: Diagnosis not present

## 2015-04-04 DIAGNOSIS — N2581 Secondary hyperparathyroidism of renal origin: Secondary | ICD-10-CM | POA: Diagnosis not present

## 2015-04-04 DIAGNOSIS — B353 Tinea pedis: Secondary | ICD-10-CM

## 2015-04-04 NOTE — Progress Notes (Signed)
Patient ID: Stefanie S Farooq, female   DOB: 01-Sep-1945, 70 y.o.   MRN: [id number]  Subjective: Stefanie [name] is a 70 y.o. female patient with history of PAD, ESRD on HD, type 2 diabetes who returns to office today  for routine nail care; Patient  reports that the glucose reading this morning was not recorded. Patient denies any new changes in medication or new problems. Patient denies any new cramping, numbness, burning or tingling in the legs.  Patient Active Problem List   Diagnosis Date Noted  . History of CVA with residual deficit 02/11/2015  . Debility 02/08/2015  . DVT (deep venous thrombosis) ([place])   . CHF (congestive heart failure) ([place])   . Chronic diastolic congestive heart failure ([place])   . ESRD (end stage renal disease) ([place])   . Congestive dilated cardiomyopathy ([place])   . Left leg DVT ([place]) 02/01/2015  . Leukocytosis 02/01/2015  . Elevated troponin 02/01/2015  . SOB (shortness of breath) 02/01/2015  . Type 2 diabetes, controlled, with neuropathy ([place]) 12/20/2014  . Pain due to onychomycosis of toenail 12/20/2014  . 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 Mechanical complication of other vascular device, implant, and graft 05/19/2013  . PAD (peripheral artery disease) ([place]) 02/24/2012  . Murmur 12/24/2011  . Bradycardia-intermittent sinus 07/10/2010  . DYSPNEA ON EXERTION 02/25/2010  . CHEST PAIN, EXERTIONAL 02/25/2010  . DM 02/24/2010  . PROTEIN MALNUTRITION 02/24/2010  . HLD (hyperlipidemia) 02/24/2010  . ANEMIA 02/24/2010  . Essential hypertension 02/24/2010  . Cerebral artery occlusion with cerebral infarction ([place]) 02/24/2010  . ESRD on dialysis ([place]) 02/24/2010  . 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 acetaminophen (TYLENOL) 325 MG tablet Take 1-2 tablets (325-650 mg total) by mouth every 4 (four) hours as needed for mild pain.    . calcitRIOL  (ROCALTROL) 0.5 MCG capsule Take 3 capsules (1.5 mcg total) by mouth Every Tuesday,Thursday,and Saturday with dialysis.    Marland Kitchen calcium acetate (ELIPHOS) 667 MG tablet Take 667 mg by mouth 3 (three) times daily with meals. Take 3 capsule in the morning, 2 capsules with a snack and 3 capsules in the evening.    . carvedilol (COREG) 3.125 MG tablet Take 1 tablet (3.125 mg total) by mouth 2 (two) times daily with a meal.    . cinacalcet (SENSIPAR) 90 MG tablet Take 90 mg by mouth daily.    . Darbepoetin Alfa (ARANESP) 100 MCG/0.5ML SOSY injection Inject 0.5 mLs (100 mcg total) into the vein every Tuesday with hemodialysis. 4.2 mL   . folic acid-vitamin b complex-vitamin c-selenium-zinc (DIALYVITE) 3 MG TABS tablet Take 1 tablet by mouth daily.    Marland Kitchen gabapentin (NEURONTIN) 300 MG capsule Take 300 mg by mouth at bedtime.    Marland Kitchen guaiFENesin (MUCINEX) 600 MG 12 hr tablet Take 1 tablet (600 mg total) by mouth 2 (two) times daily as needed for cough.    . insulin glargine (LANTUS) 100 UNIT/ML injection Inject 0.1 mLs (10 Units total) into the skin at bedtime. 10 mL 11  . ketoconazole (NIZORAL) 2 % cream Apply 1 application topically daily. 15 g 0  . latanoprost (XALATAN) 0.005 % ophthalmic solution Place 1 drop into the left eye at bedtime.    . lidocaine-prilocaine (EMLA) cream Apply 1 application topically as needed (topical anesthesia for hemodialysis if Gebauers and Lidocaine injection are ineffective.). 30 g 0  . Menthol-Methyl Salicylate (MUSCLE RUB) 10-15 %  CREA Apply 1 application topically 2 (two) times daily as needed for muscle pain.  0  . multivitamin (RENA-VIT) TABS tablet Take 1 tablet by mouth at bedtime.  0  . nitroGLYCERIN (NITROSTAT) 0.4 MG SL tablet Place 1 tablet (0.4 mg total) under the tongue every 5 (five) minutes as needed for chest pain.  12  . omeprazole (PRILOSEC) 40 MG capsule Take 40 mg by mouth daily.    . polycarbophil (FIBERCON) 625 MG tablet Take 2 tablets (1,250 mg total) by mouth  daily.    . polyethylene glycol (MIRALAX / GLYCOLAX) packet Take 17 g by mouth daily as needed. 14 each 0  . pravastatin (PRAVACHOL) 40 MG tablet Take 40 mg by mouth daily.     Marland Kitchen PROAIR HFA 108 (90 BASE) MCG/ACT inhaler Inhale 1 puff into the lungs every 4 (four) hours as needed for wheezing or shortness of breath.     Marland Kitchen rOPINIRole (REQUIP) 0.5 MG tablet Take 0.5 mg by mouth at bedtime.  12  . saccharomyces boulardii (FLORASTOR) 250 MG capsule Take 1 capsule (250 mg total) by mouth 2 (two) times daily.    . VOLTAREN 1 % GEL APPLY 2 GRAMS TO AREA UP TO 4 TIMES A DAY  2  . warfarin (COUMADIN) 5 MG tablet Take 7.5 mg today with supper. Alternate 5 mg with 7.5 every other day.     No current facility-administered medications on file prior to visit.   No Known Allergies  Labs: HEMOGLOBIN A1C- No recent lab found/available   Objective: General: Patient is awake, alert, and oriented x 3 and in no acute distress.  Integument: Skin is warm, dry and supple bilateral.  Nails 10 are thickened, dystrophic, with moderate subungual debris, consistent with onychomycosis. No open lesions or preulcerative lesions present bilateral.  Bilateral plantar xerosis/ tinea now much improved and resolved since treatment with ketoconazole. Remaining integument unremarkable.  Vasculature:  Dorsalis Pedis pulse 0/4 bilateral. Posterior Tibial pulse  0/4 bilateral.  No ischemia. No gangrene bilateral. Capillary fill time <5 sec 1-5 bilateral. No hair growth to the level of the digits. Temperature gradient within normal limits. No varicosities present bilateral. No edema present bilateral.   Neurology: The patient has diminished sensation measured with a 5.07/10g Semmes Weinstein Monofilament at all pedal sites bilateral. Vibratory sensation absent bilateral with tuning fork. No Babinski sign present bilateral.   Musculoskeletal: Old scar bunionectomy with residual deformity on right, Hammertoes 2-5 noted bilateral.  Muscular strength 5/5 in all lower extremity muscular groups bilateral without pain or limitation on range of motion . No tenderness with calf compression bilateral.  Assessment and Plan: Problem List Items Addressed This Visit      Cardiovascular and Mediastinum   PAD (peripheral artery disease) ([place])     Endocrine   Type 2 diabetes, controlled, with neuropathy ([facility])     Musculoskeletal and Integument   Pain due to onychomycosis of toenail - Primary    Other Visit Diagnoses    Tinea pedis, unspecified laterality        improved      -Examined patient. -Discussed and educated patient on diabetic foot care, especially with  regards to the vascular, neurological and musculoskeletal systems.  -Stressed the importance of good glycemic control and the detriment of not  controlling glucose levels in relation to the foot. -Mechanically debrided  Nails 10 using sterile nail nipper and dremel without incident.  -Cont Ketoconazole cream for tinea  Until complete -Answered all patient questions -Patient  to return  In 3 months for at risk foot care -Patient advised to call the office if any problems or questions arise in the  Meantime.  Landis Martins, DPM

## 2015-04-05 DIAGNOSIS — E1122 Type 2 diabetes mellitus with diabetic chronic kidney disease: Secondary | ICD-10-CM | POA: Diagnosis not present

## 2015-04-05 DIAGNOSIS — I82402 Acute embolism and thrombosis of unspecified deep veins of left lower extremity: Secondary | ICD-10-CM | POA: Diagnosis not present

## 2015-04-05 DIAGNOSIS — I69359 Hemiplegia and hemiparesis following cerebral infarction affecting unspecified side: Secondary | ICD-10-CM | POA: Diagnosis not present

## 2015-04-05 DIAGNOSIS — I132 Hypertensive heart and chronic kidney disease with heart failure and with stage 5 chronic kidney disease, or end stage renal disease: Secondary | ICD-10-CM | POA: Diagnosis not present

## 2015-04-05 DIAGNOSIS — I509 Heart failure, unspecified: Secondary | ICD-10-CM | POA: Diagnosis not present

## 2015-04-05 DIAGNOSIS — N186 End stage renal disease: Secondary | ICD-10-CM | POA: Diagnosis not present

## 2015-04-06 DIAGNOSIS — E1129 Type 2 diabetes mellitus with other diabetic kidney complication: Secondary | ICD-10-CM | POA: Diagnosis not present

## 2015-04-06 DIAGNOSIS — D631 Anemia in chronic kidney disease: Secondary | ICD-10-CM | POA: Diagnosis not present

## 2015-04-06 DIAGNOSIS — N2581 Secondary hyperparathyroidism of renal origin: Secondary | ICD-10-CM | POA: Diagnosis not present

## 2015-04-06 DIAGNOSIS — N186 End stage renal disease: Secondary | ICD-10-CM | POA: Diagnosis not present

## 2015-04-08 DIAGNOSIS — E119 Type 2 diabetes mellitus without complications: Secondary | ICD-10-CM | POA: Diagnosis not present

## 2015-04-08 DIAGNOSIS — I132 Hypertensive heart and chronic kidney disease with heart failure and with stage 5 chronic kidney disease, or end stage renal disease: Secondary | ICD-10-CM | POA: Diagnosis not present

## 2015-04-08 DIAGNOSIS — I509 Heart failure, unspecified: Secondary | ICD-10-CM | POA: Diagnosis not present

## 2015-04-08 DIAGNOSIS — E785 Hyperlipidemia, unspecified: Secondary | ICD-10-CM | POA: Diagnosis not present

## 2015-04-08 DIAGNOSIS — N186 End stage renal disease: Secondary | ICD-10-CM | POA: Diagnosis not present

## 2015-04-08 DIAGNOSIS — G8929 Other chronic pain: Secondary | ICD-10-CM | POA: Diagnosis not present

## 2015-04-08 DIAGNOSIS — I10 Essential (primary) hypertension: Secondary | ICD-10-CM | POA: Diagnosis not present

## 2015-04-08 DIAGNOSIS — E1121 Type 2 diabetes mellitus with diabetic nephropathy: Secondary | ICD-10-CM | POA: Diagnosis not present

## 2015-04-08 DIAGNOSIS — I69359 Hemiplegia and hemiparesis following cerebral infarction affecting unspecified side: Secondary | ICD-10-CM | POA: Diagnosis not present

## 2015-04-08 DIAGNOSIS — E1122 Type 2 diabetes mellitus with diabetic chronic kidney disease: Secondary | ICD-10-CM | POA: Diagnosis not present

## 2015-04-08 DIAGNOSIS — N185 Chronic kidney disease, stage 5: Secondary | ICD-10-CM | POA: Diagnosis not present

## 2015-04-08 DIAGNOSIS — I82402 Acute embolism and thrombosis of unspecified deep veins of left lower extremity: Secondary | ICD-10-CM | POA: Diagnosis not present

## 2015-04-08 NOTE — Progress Notes (Signed)
This encounter was created in error - please disregard.

## 2015-04-09 ENCOUNTER — Emergency Department (EMERGENCY_DEPARTMENT_HOSPITAL): Admit: 2015-04-09 | Discharge: 2015-04-09 | Disposition: A | Discharge status: Home / Self Care | Payer: Medicare Other

## 2015-04-09 ENCOUNTER — Encounter (HOSPITAL_COMMUNITY): Payer: Self-pay

## 2015-04-09 ENCOUNTER — Emergency Department (HOSPITAL_COMMUNITY): Payer: Medicare Other

## 2015-04-09 ENCOUNTER — Ambulatory Visit: Payer: Medicare Other | Admitting: Physical Medicine & Rehabilitation

## 2015-04-09 ENCOUNTER — Emergency Department (HOSPITAL_COMMUNITY): Admit: 2015-04-09 | Payer: Medicare Other

## 2015-04-09 ENCOUNTER — Inpatient Hospital Stay (HOSPITAL_COMMUNITY)
Admission: EM | Admit: 2015-04-09 | Discharge: 2015-04-19 | DRG: 602 | Disposition: A | Discharge status: Skilled Nursing Facility | Payer: Medicare Other | Attending: Family Medicine | Admitting: Family Medicine

## 2015-04-09 ENCOUNTER — Encounter: Payer: Medicare Other | Attending: Physical Medicine & Rehabilitation

## 2015-04-09 ENCOUNTER — Emergency Department (HOSPITAL_BASED_OUTPATIENT_CLINIC_OR_DEPARTMENT_OTHER): Admit: 2015-04-09 | Discharge: 2015-04-09 | Disposition: A | Discharge status: Home / Self Care | Payer: Medicare Other

## 2015-04-09 DIAGNOSIS — Z992 Dependence on renal dialysis: Secondary | ICD-10-CM

## 2015-04-09 DIAGNOSIS — M79662 Pain in left lower leg: Secondary | ICD-10-CM | POA: Diagnosis not present

## 2015-04-09 DIAGNOSIS — D631 Anemia in chronic kidney disease: Secondary | ICD-10-CM | POA: Diagnosis present

## 2015-04-09 DIAGNOSIS — I82402 Acute embolism and thrombosis of unspecified deep veins of left lower extremity: Secondary | ICD-10-CM | POA: Diagnosis not present

## 2015-04-09 DIAGNOSIS — R0989 Other specified symptoms and signs involving the circulatory and respiratory systems: Secondary | ICD-10-CM | POA: Diagnosis not present

## 2015-04-09 DIAGNOSIS — N2581 Secondary hyperparathyroidism of renal origin: Secondary | ICD-10-CM | POA: Diagnosis present

## 2015-04-09 DIAGNOSIS — R261 Paralytic gait: Secondary | ICD-10-CM | POA: Diagnosis not present

## 2015-04-09 DIAGNOSIS — E1151 Type 2 diabetes mellitus with diabetic peripheral angiopathy without gangrene: Secondary | ICD-10-CM | POA: Diagnosis present

## 2015-04-09 DIAGNOSIS — E7800 Pure hypercholesterolemia, unspecified: Secondary | ICD-10-CM | POA: Diagnosis present

## 2015-04-09 DIAGNOSIS — I69959 Hemiplegia and hemiparesis following unspecified cerebrovascular disease affecting unspecified side: Secondary | ICD-10-CM | POA: Diagnosis not present

## 2015-04-09 DIAGNOSIS — E872 Acidosis, unspecified: Secondary | ICD-10-CM | POA: Insufficient documentation

## 2015-04-09 DIAGNOSIS — E1122 Type 2 diabetes mellitus with diabetic chronic kidney disease: Secondary | ICD-10-CM | POA: Diagnosis present

## 2015-04-09 DIAGNOSIS — I639 Cerebral infarction, unspecified: Secondary | ICD-10-CM | POA: Diagnosis not present

## 2015-04-09 DIAGNOSIS — I5032 Chronic diastolic (congestive) heart failure: Secondary | ICD-10-CM | POA: Diagnosis present

## 2015-04-09 DIAGNOSIS — I739 Peripheral vascular disease, unspecified: Secondary | ICD-10-CM

## 2015-04-09 DIAGNOSIS — R509 Fever, unspecified: Secondary | ICD-10-CM | POA: Diagnosis not present

## 2015-04-09 DIAGNOSIS — R278 Other lack of coordination: Secondary | ICD-10-CM | POA: Diagnosis not present

## 2015-04-09 DIAGNOSIS — M79609 Pain in unspecified limb: Secondary | ICD-10-CM | POA: Diagnosis not present

## 2015-04-09 DIAGNOSIS — E8889 Other specified metabolic disorders: Secondary | ICD-10-CM | POA: Diagnosis present

## 2015-04-09 DIAGNOSIS — B9689 Other specified bacterial agents as the cause of diseases classified elsewhere: Secondary | ICD-10-CM | POA: Diagnosis present

## 2015-04-09 DIAGNOSIS — Z9849 Cataract extraction status, unspecified eye: Secondary | ICD-10-CM

## 2015-04-09 DIAGNOSIS — I2510 Atherosclerotic heart disease of native coronary artery without angina pectoris: Secondary | ICD-10-CM | POA: Diagnosis present

## 2015-04-09 DIAGNOSIS — I5042 Chronic combined systolic (congestive) and diastolic (congestive) heart failure: Secondary | ICD-10-CM | POA: Diagnosis present

## 2015-04-09 DIAGNOSIS — E1040 Type 1 diabetes mellitus with diabetic neuropathy, unspecified: Secondary | ICD-10-CM | POA: Diagnosis not present

## 2015-04-09 DIAGNOSIS — L0390 Cellulitis, unspecified: Secondary | ICD-10-CM | POA: Diagnosis present

## 2015-04-09 DIAGNOSIS — Z794 Long term (current) use of insulin: Secondary | ICD-10-CM

## 2015-04-09 DIAGNOSIS — I120 Hypertensive chronic kidney disease with stage 5 chronic kidney disease or end stage renal disease: Secondary | ICD-10-CM | POA: Diagnosis not present

## 2015-04-09 DIAGNOSIS — M2681 Anterior soft tissue impingement: Secondary | ICD-10-CM | POA: Diagnosis not present

## 2015-04-09 DIAGNOSIS — Z7901 Long term (current) use of anticoagulants: Secondary | ICD-10-CM

## 2015-04-09 DIAGNOSIS — L03116 Cellulitis of left lower limb: Secondary | ICD-10-CM | POA: Diagnosis not present

## 2015-04-09 DIAGNOSIS — I132 Hypertensive heart and chronic kidney disease with heart failure and with stage 5 chronic kidney disease, or end stage renal disease: Secondary | ICD-10-CM | POA: Diagnosis present

## 2015-04-09 DIAGNOSIS — E1121 Type 2 diabetes mellitus with diabetic nephropathy: Secondary | ICD-10-CM | POA: Diagnosis not present

## 2015-04-09 DIAGNOSIS — I10 Essential (primary) hypertension: Secondary | ICD-10-CM | POA: Diagnosis present

## 2015-04-09 DIAGNOSIS — R52 Pain, unspecified: Secondary | ICD-10-CM

## 2015-04-09 DIAGNOSIS — Z79899 Other long term (current) drug therapy: Secondary | ICD-10-CM | POA: Diagnosis not present

## 2015-04-09 DIAGNOSIS — Z95820 Peripheral vascular angioplasty status with implants and grafts: Secondary | ICD-10-CM | POA: Diagnosis not present

## 2015-04-09 DIAGNOSIS — J449 Chronic obstructive pulmonary disease, unspecified: Secondary | ICD-10-CM | POA: Diagnosis present

## 2015-04-09 DIAGNOSIS — G2581 Restless legs syndrome: Secondary | ICD-10-CM | POA: Diagnosis present

## 2015-04-09 DIAGNOSIS — Z87891 Personal history of nicotine dependence: Secondary | ICD-10-CM

## 2015-04-09 DIAGNOSIS — D696 Thrombocytopenia, unspecified: Secondary | ICD-10-CM | POA: Diagnosis present

## 2015-04-09 DIAGNOSIS — I420 Dilated cardiomyopathy: Secondary | ICD-10-CM | POA: Diagnosis not present

## 2015-04-09 DIAGNOSIS — I509 Heart failure, unspecified: Secondary | ICD-10-CM | POA: Diagnosis not present

## 2015-04-09 DIAGNOSIS — M6281 Muscle weakness (generalized): Secondary | ICD-10-CM | POA: Diagnosis not present

## 2015-04-09 DIAGNOSIS — I69320 Aphasia following cerebral infarction: Secondary | ICD-10-CM

## 2015-04-09 DIAGNOSIS — E785 Hyperlipidemia, unspecified: Secondary | ICD-10-CM | POA: Diagnosis present

## 2015-04-09 DIAGNOSIS — Z86718 Personal history of other venous thrombosis and embolism: Secondary | ICD-10-CM | POA: Diagnosis not present

## 2015-04-09 DIAGNOSIS — L03119 Cellulitis of unspecified part of limb: Secondary | ICD-10-CM | POA: Diagnosis not present

## 2015-04-09 DIAGNOSIS — E46 Unspecified protein-calorie malnutrition: Secondary | ICD-10-CM | POA: Diagnosis present

## 2015-04-09 DIAGNOSIS — N186 End stage renal disease: Secondary | ICD-10-CM | POA: Diagnosis not present

## 2015-04-09 DIAGNOSIS — E1129 Type 2 diabetes mellitus with other diabetic kidney complication: Secondary | ICD-10-CM | POA: Diagnosis not present

## 2015-04-09 DIAGNOSIS — R0602 Shortness of breath: Secondary | ICD-10-CM | POA: Diagnosis not present

## 2015-04-09 DIAGNOSIS — M79605 Pain in left leg: Secondary | ICD-10-CM | POA: Diagnosis not present

## 2015-04-09 DIAGNOSIS — R7881 Bacteremia: Secondary | ICD-10-CM | POA: Diagnosis not present

## 2015-04-09 DIAGNOSIS — R262 Difficulty in walking, not elsewhere classified: Secondary | ICD-10-CM | POA: Diagnosis not present

## 2015-04-09 DIAGNOSIS — D72829 Elevated white blood cell count, unspecified: Secondary | ICD-10-CM | POA: Diagnosis present

## 2015-04-09 DIAGNOSIS — I6990 Unspecified sequelae of unspecified cerebrovascular disease: Secondary | ICD-10-CM | POA: Diagnosis not present

## 2015-04-09 DIAGNOSIS — I6930 Unspecified sequelae of cerebral infarction: Secondary | ICD-10-CM

## 2015-04-09 DIAGNOSIS — H33003 Unspecified retinal detachment with retinal break, bilateral: Secondary | ICD-10-CM | POA: Diagnosis not present

## 2015-04-09 DIAGNOSIS — E441 Mild protein-calorie malnutrition: Secondary | ICD-10-CM | POA: Diagnosis not present

## 2015-04-09 HISTORY — DX: Disorder of kidney and ureter, unspecified: N28.9

## 2015-04-09 LAB — COMPREHENSIVE METABOLIC PANEL
ALBUMIN: 3.8 g/dL (ref 3.5–5.0)
ALK PHOS: 70 U/L (ref 38–126)
ALT: 104 U/L — AB (ref 14–54)
AST: 68 U/L — AB (ref 15–41)
Anion gap: 21 — ABNORMAL HIGH (ref 5–15)
BILIRUBIN TOTAL: 0.8 mg/dL (ref 0.3–1.2)
BUN: 88 mg/dL — AB (ref 6–20)
CALCIUM: 9.7 mg/dL (ref 8.9–10.3)
CO2: 24 mmol/L (ref 22–32)
CREATININE: 9.17 mg/dL — AB (ref 0.44–1.00)
Chloride: 98 mmol/L — ABNORMAL LOW (ref 101–111)
GFR calc Af Amer: 4 mL/min — ABNORMAL LOW (ref >60)
GFR calc non Af Amer: 4 mL/min — ABNORMAL LOW (ref >60)
GLUCOSE: 157 mg/dL — AB (ref 65–99)
POTASSIUM: 4.9 mmol/L (ref 3.5–5.1)
Sodium: 143 mmol/L (ref 135–145)
TOTAL PROTEIN: 6.1 g/dL — AB (ref 6.5–8.1)

## 2015-04-09 LAB — I-STAT CG4 LACTIC ACID, ED
LACTIC ACID, VENOUS: 3.37 mmol/L — AB (ref 0.5–2.0)
LACTIC ACID, VENOUS: 2.34 mmol/L — AB (ref 0.5–2.0)

## 2015-04-09 LAB — CBC WITH DIFFERENTIAL/PLATELET
BASOS PCT: 0 %
Basophils Absolute: 0 10*3/uL (ref 0.0–0.1)
Eosinophils Absolute: 0 10*3/uL (ref 0.0–0.7)
Eosinophils Relative: 0 %
HEMATOCRIT: 33.9 % — AB (ref 36.0–46.0)
HEMOGLOBIN: 11.2 g/dL — AB (ref 12.0–15.0)
LYMPHS PCT: 5 %
Lymphs Abs: 0.7 10*3/uL (ref 0.7–4.0)
MCH: 31.8 pg (ref 26.0–34.0)
MCHC: 33 g/dL (ref 30.0–36.0)
MCV: 96.3 fL (ref 78.0–100.0)
MONOS PCT: 5 %
Monocytes Absolute: 0.7 10*3/uL (ref 0.1–1.0)
NEUTROS ABS: 11.9 10*3/uL — AB (ref 1.7–7.7)
NEUTROS PCT: 90 %
Platelets: 123 10*3/uL — ABNORMAL LOW (ref 150–400)
RBC: 3.52 MIL/uL — ABNORMAL LOW (ref 3.87–5.11)
RDW: 18.2 % — ABNORMAL HIGH (ref 11.5–15.5)
WBC: 13.3 10*3/uL — ABNORMAL HIGH (ref 4.0–10.5)

## 2015-04-09 LAB — PROTIME-INR
INR: 1.21 (ref 0.00–1.49)
Prothrombin Time: 15.5 seconds — ABNORMAL HIGH (ref 11.6–15.2)

## 2015-04-09 LAB — APTT: aPTT: 26 seconds (ref 24–37)

## 2015-04-09 MED ORDER — VANCOMYCIN HCL 10 G IV SOLR
1500.0000 mg | Freq: Once | INTRAVENOUS | Status: AC
  Administered 2015-04-09: 1500 mg via INTRAVENOUS
  Filled 2015-04-09: qty 1500

## 2015-04-09 MED ORDER — HYDROMORPHONE HCL 1 MG/ML IJ SOLN
0.5000 mg | Freq: Once | INTRAMUSCULAR | Status: AC
  Administered 2015-04-09: 0.5 mg via INTRAVENOUS
  Filled 2015-04-09: qty 1

## 2015-04-09 MED ORDER — IOHEXOL 350 MG/ML SOLN
100.0000 mL | Freq: Once | INTRAVENOUS | Status: AC | PRN
  Administered 2015-04-09: 100 mL via INTRAVENOUS

## 2015-04-09 MED ORDER — VANCOMYCIN HCL IN DEXTROSE 750-5 MG/150ML-% IV SOLN
750.0000 mg | INTRAVENOUS | Status: DC
  Administered 2015-04-11: 750 mg via INTRAVENOUS
  Filled 2015-04-09 (×2): qty 150

## 2015-04-09 MED ORDER — SODIUM CHLORIDE 0.9 % IV BOLUS (SEPSIS)
500.0000 mL | Freq: Once | INTRAVENOUS | Status: AC
  Administered 2015-04-09: 500 mL via INTRAVENOUS

## 2015-04-09 MED ORDER — PIPERACILLIN-TAZOBACTAM 3.375 G IVPB 30 MIN
3.3750 g | Freq: Once | INTRAVENOUS | Status: AC
  Administered 2015-04-09: 3.375 g via INTRAVENOUS
  Filled 2015-04-09: qty 50

## 2015-04-09 MED ORDER — PIPERACILLIN-TAZOBACTAM IN DEX 2-0.25 GM/50ML IV SOLN
2.2500 g | Freq: Three times a day (TID) | INTRAVENOUS | Status: DC
  Administered 2015-04-10 – 2015-04-12 (×6): 2.25 g via INTRAVENOUS
  Filled 2015-04-09 (×12): qty 50

## 2015-04-09 MED ORDER — PIPERACILLIN-TAZOBACTAM IN DEX 2-0.25 GM/50ML IV SOLN
2.2500 g | Freq: Three times a day (TID) | INTRAVENOUS | Status: DC
  Filled 2015-04-09 (×3): qty 50

## 2015-04-09 NOTE — ED Notes (Signed)
Pt made aware of bed assignment 

## 2015-04-09 NOTE — ED Notes (Signed)
Dinner Tray ordered/Renal Diet @ [id number].

## 2015-04-09 NOTE — Progress Notes (Signed)
ANTIBIOTIC CONSULT NOTE - INITIAL  Pharmacy Consult for vancomycin, zosyn Indication: cellulitis  No Known Allergies  Patient Measurements: Height: 5\' 4"  (162.6 cm) Weight: 167 lb (75.751 kg) IBW/kg (Calculated) : 54.7 Adjusted Body Weight:   Vital Signs: Temp: 97.4 F (36.3 C) (01/24 0800) Temp Source: Oral (01/24 0800) BP: 137/60 mmHg (01/24 1100) Pulse Rate: 65 (01/24 1100) Intake/Output from previous day:   Intake/Output from this shift:    Labs:  Recent Labs  04/09/15 0842  WBC 13.3*  HGB 11.2*  PLT 123*  CREATININE 9.17*   Estimated Creatinine Clearance: 5.8 mL/min (by C-G formula based on Cr of 9.17). No results for input(s): VANCOTROUGH, VANCOPEAK, VANCORANDOM, GENTTROUGH, GENTPEAK, GENTRANDOM, TOBRATROUGH, TOBRAPEAK, TOBRARND, AMIKACINPEAK, AMIKACINTROU, AMIKACIN in the last 72 hours.   Microbiology: No results found for this or any previous visit (from the past 720 hour(s)).  Medical History: Past Medical History  Diagnosis Date  . Hypertension   . Hyperlipidemia   . Diabetes mellitus   . CVA (cerebral vascular accident) ([place]) [place]    left sided weakness  . Protein malnutrition ([place])   . Secondary hyperparathyroidism ([place])   . Anemia   . End stage renal disease ([place])     initiated HD 2006  . Hx of echocardiogram     a. Echo 03/2010: EF 55-60%, Gr 1 diast dysfn, trivial MS, mild to mod LAE, PASP 34, ;  b. Echo 11/13: mild LVH, EF 60%, Gr 1 diast dysfn, Ao sclerosis, no AS, MAC, slight MS, mean 3 mmHg, PASP 34  . Hx of cardiovascular stress test     a. MV 03/2010:  no ischemia, EF 55%  . CHF (congestive heart failure) ([place])   . Retinal detachment, bilateral   . CAD (coronary artery disease)     Medications:  See EMR  Assessment: 70 yo female admitted from dialysis with pain from L lower leg and discoloration of leg. Pt is on Eliquis pta for hx of DVT, currently, Dopplers are negative for acute DVT. Pt is ESRD on HD-TuThSat. History of  diabetic foot infections. Will start IV abx for cellulitis. WBC 13.3, LA 3.3 > 2.3.   Goal of Therapy:  Vancomycin trough level 10-15 mcg/ml  Plan:  -Vancomycin 1500 mg IV x1 then 750 mg IV qHD -Zosyn 2.25 g IV q8h -Monitor HD schedule, cultures, duration of therapy, vascular plans   Harvel Quale 04/09/2015,2:38 PM

## 2015-04-09 NOTE — ED Notes (Signed)
Daughter, Solmon Ice, called and updated with permission from pt.

## 2015-04-09 NOTE — ED Notes (Signed)
Pharmacy called about new Zosyn order and will reschedule due to recent administration

## 2015-04-09 NOTE — ED Notes (Signed)
ED MD made aware of pt

## 2015-04-09 NOTE — ED Notes (Signed)
Daughter called and updated on pt

## 2015-04-09 NOTE — Progress Notes (Signed)
*  PRELIMINARY RESULTS* Vascular Ultrasound Left lower extremity venous duplex has been completed.  Preliminary findings: no evidence of acute DVT. 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 An area of mixed echoes is noted in the left groin adjacent to the HD access. Possibly representing a hematoma.    Landry Mellow, RDMS, RVT  04/09/2015, 12:37 PM

## 2015-04-09 NOTE — ED Notes (Signed)
Vascular called and made aware of pt

## 2015-04-09 NOTE — ED Provider Notes (Signed)
CSN: [id number]     Arrival date & time 04/09/15  0751 History   First MD Initiated Contact with Patient 04/09/15 0800     Chief Complaint  Patient presents with  . Leg Pain     (Consider location/radiation/quality/duration/timing/severity/associated sxs/prior Treatment) HPI   Blood pressure 159/65, pulse 70, temperature 97.4 F (36.3 C), temperature source Oral, resp. rate 20, height 5\' 4"  (1.626 m), weight 75.751 kg, SpO2 99 %.  Stefanie [name] is a 70 y.o. female with past medical history significant for diabetes, CVA, CHF, CAD sent  from dialysis (not dialyzed, does not make urine) for evaluation of severe, atraumatic, left lower extremity pain onset yesterday. No pain medication taken prior to arrival. Patient has history of DVT in the same leg in 2016, states she's been compliant with her Coumadin. States that this felt similar to when she had a clot in the leg before. Denies recent trauma, scratches to the affected leg, fever, chills, chest pain, shortness of breath, numbness, weakness, decreased range of motion, decreased sensation.  Past Medical History  Diagnosis Date  . Hypertension   . Hyperlipidemia   . Diabetes mellitus   . CVA (cerebral vascular accident) ([place]) [place]    left sided weakness  . Protein malnutrition ([place])   . Secondary hyperparathyroidism ([place])   . Anemia   . End stage renal disease ([place])     initiated HD 2006  . Hx of echocardiogram     a. Echo 03/2010: EF 55-60%, Gr 1 diast dysfn, trivial MS, mild to mod LAE, PASP 34, ;  b. Echo 11/13: mild LVH, EF 60%, Gr 1 diast dysfn, Ao sclerosis, no AS, MAC, slight MS, mean 3 mmHg, PASP 34  . Hx of cardiovascular stress test     a. MV 03/2010:  no ischemia, EF 55%  . CHF (congestive heart failure) ([place])   . Retinal detachment, bilateral   . CAD (coronary artery disease)    Past Surgical History  Procedure Laterality Date  . Cataract extraction    . Tubal ligation    . Arteriovenous graft placement       BUE numerous times  . Arteriovenous graft placement  11/13/10    Lt femoral - Dr. Kellie Simmering  . Dg av dialysis graft declot or  06/06/11, 08/26/11    performed in IR  . Shuntogram N/A 05/29/2013    Procedure: Earney Mallet;  Surgeon: Conrad [place], MD;  Location: [facility] CATH LAB;  Service: Cardiovascular;  Laterality: N/A;  . Shuntogram Left 03/01/2014    Procedure: SHUNTOGRAM;  Surgeon: Conrad Tresckow, MD;  Location[facility] CATH LAB;  Service: Cardiovascular;  Laterality: Left;  . Cardiac catheterization N/A 02/05/2015    Procedure: Left Heart Cath and Coronary Angiography;  Surgeon: Jettie Booze, MD;  Location: [place] CV LAB;  Service: Cardiovascular;  Laterality: N/A;  . Peripheral vascular catheterization N/A 02/06/2015    Procedure: A/V Shuntogram;  Surgeon: Elam Dutch, MD;  Location: [place] CV LAB;  Service: Cardiovascular;  Laterality: N/A;   Family History  Problem Relation Age of Onset  . Cardiomyopathy    . Heart disease Mother   . Diabetes Mother   . Hypertension Mother   . Heart attack Mother   . Diabetes Other   . Kidney disease Other   . Diabetes Daughter   . Diabetes Son   . Hypertension Son    Social History  Substance Use Topics  . Smoking status: Former Smoker  Quit date: 03/17/1983  . Smokeless tobacco: Never Used  . Alcohol Use: No   OB History    No data available     Review of Systems  10 systems reviewed and found to be negative, except as noted in the HPI.   Allergies  Review of patient's allergies indicates no known allergies.  Home Medications   Prior to Admission medications   Medication Sig Start Date End Date Taking? Authorizing Provider  acetaminophen (TYLENOL) 325 MG tablet Take 1-2 tablets (325-650 mg total) by mouth every 4 (four) hours as needed for mild pain. 02/25/15  Yes Ivan Anchors Love, PA-C  apixaban (ELIQUIS) 5 MG TABS tablet Take 5 mg by mouth 2 (two) times daily.   Yes Historical Provider, MD  calcium acetate (ELIPHOS) 667  MG tablet Take 667 mg by mouth 3 (three) times daily with meals. Take 3 capsule in the morning, 2 capsules with a snack and 3 capsules in the evening.   Yes Historical Provider, MD  cinacalcet (SENSIPAR) 90 MG tablet Take 90 mg by mouth daily.   Yes Historical Provider, MD  folic acid-vitamin b complex-vitamin c-selenium-zinc (DIALYVITE) 3 MG TABS tablet Take 1 tablet by mouth daily.   Yes Historical Provider, MD  gabapentin (NEURONTIN) 300 MG capsule Take 300 mg by mouth at bedtime.   Yes Historical Provider, MD  guaiFENesin (MUCINEX) 600 MG 12 hr tablet Take 1 tablet (600 mg total) by mouth 2 (two) times daily as needed for cough. 02/25/15  Yes Ivan Anchors Love, PA-C  insulin glargine (LANTUS) 100 UNIT/ML injection Inject 0.1 mLs (10 Units total) into the skin at bedtime. 02/25/15  Yes Ivan Anchors Love, PA-C  latanoprost (XALATAN) 0.005 % ophthalmic solution Place 1 drop into the left eye at bedtime.   Yes Historical Provider, MD  lisinopril (PRINIVIL,ZESTRIL) 30 MG tablet Take 30 mg by mouth at bedtime.   Yes Historical Provider, MD  nitroGLYCERIN (NITROSTAT) 0.4 MG SL tablet Place 1 tablet (0.4 mg total) under the tongue every 5 (five) minutes as needed for chest pain. 02/25/15  Yes Ivan Anchors Love, PA-C  polyethylene glycol (MIRALAX / GLYCOLAX) packet Take 17 g by mouth daily as needed. Patient taking differently: Take 17 g by mouth daily.  02/25/15  Yes Ivan Anchors Love, PA-C  pravastatin (PRAVACHOL) 40 MG tablet Take 40 mg by mouth daily.  02/11/14  Yes Historical Provider, MD  PROAIR HFA 108 (90 BASE) MCG/ACT inhaler Inhale 1 puff into the lungs every 4 (four) hours as needed for wheezing or shortness of breath.  01/13/14  Yes Historical Provider, MD  rOPINIRole (REQUIP) 0.5 MG tablet Take 0.5 mg by mouth at bedtime. 07/27/14  Yes Historical Provider, MD  saccharomyces boulardii (FLORASTOR) 250 MG capsule Take 1 capsule (250 mg total) by mouth 2 (two) times daily. 02/25/15  Yes Pamela S Love, PA-C   VOLTAREN 1 % GEL APPLY 2 GRAMS TO AREA UP TO 4 TIMES A DAY 09/25/14  Yes Historical Provider, MD  calcitRIOL (ROCALTROL) 0.5 MCG capsule Take 3 capsules (1.5 mcg total) by mouth Every Tuesday,Thursday,and Saturday with dialysis. Patient not taking: Reported on 04/09/2015 02/25/15   Ivan Anchors Love, PA-C  carvedilol (COREG) 3.125 MG tablet Take 1 tablet (3.125 mg total) by mouth 2 (two) times daily with a meal. Patient not taking: Reported on 04/09/2015 02/25/15   Ivan Anchors Love, PA-C  Darbepoetin Alfa (ARANESP) 100 MCG/0.5ML SOSY injection Inject 0.5 mLs (100 mcg total) into the vein every Tuesday with hemodialysis.  Patient not taking: Reported on 04/09/2015 02/25/15   Ivan Anchors Love, PA-C  ketoconazole (NIZORAL) 2 % cream Apply 1 application topically daily. Patient not taking: Reported on 04/09/2015 01/17/15   Landis Martins, DPM  lidocaine-prilocaine (EMLA) cream Apply 1 application topically as needed (topical anesthesia for hemodialysis if Gebauers and Lidocaine injection are ineffective.). Patient not taking: Reported on 04/09/2015 02/25/15   Ivan Anchors Love, PA-C  Menthol-Methyl Salicylate (MUSCLE RUB) 10-15 % CREA Apply 1 application topically 2 (two) times daily as needed for muscle pain. Patient not taking: Reported on 04/09/2015 02/25/15   Ivan Anchors Love, PA-C  multivitamin (RENA-VIT) TABS tablet Take 1 tablet by mouth at bedtime. Patient not taking: Reported on 04/09/2015 02/25/15   Ivan Anchors Love, PA-C  polycarbophil (FIBERCON) 625 MG tablet Take 2 tablets (1,250 mg total) by mouth daily. Patient not taking: Reported on 04/09/2015 02/25/15   Ivan Anchors Love, PA-C  warfarin (COUMADIN) 5 MG tablet Take 7.5 mg today with supper. Alternate 5 mg with 7.5 every other day. Patient not taking: Reported on 04/09/2015 02/25/15   Ivan Anchors Love, PA-C   BP 137/60 mmHg  Pulse 65  Temp(Src) 97.4 F (36.3 C) (Oral)  Resp 20  Ht 5\' 4"  (1.626 m)  Wt 75.751 kg  BMI 28.65 kg/m2  SpO2 99% Physical Exam   Constitutional: She is oriented to person, place, and time. She appears well-developed and well-nourished. No distress.  HENT:  Head: Normocephalic and atraumatic.  Mouth/Throat: No oropharyngeal exudate.  Eyes: Conjunctivae and EOM are normal.  Cardiovascular: Normal rate and intact distal pulses.   Pulmonary/Chest: Effort normal. No stridor.  Musculoskeletal: Normal range of motion. She exhibits tenderness. She exhibits no edema.  Left lower extremity with hyperpigmentation and warmth, no frank edema or calf asymmetry. Strength 5 out of 5 to ankle and great toe. Distal sensation is intact, calf refill less than 2 seconds 5. Unable to palpate or auscultate dorsalis pedis, 2+ posterior tibial pulse  Fistula in place to left thigh with good thrill  Neurological: She is alert and oriented to person, place, and time.  Psychiatric: She has a normal mood and affect.  Nursing note and vitals reviewed.   ED Course  Procedures (including critical care time) Labs Review Labs Reviewed  CBC WITH DIFFERENTIAL/PLATELET - Abnormal; Notable for the following:    WBC 13.3 (*)    RBC 3.52 (*)    Hemoglobin 11.2 (*)    HCT 33.9 (*)    RDW 18.2 (*)    Platelets 123 (*)    Neutro Abs 11.9 (*)    All other components within normal limits  COMPREHENSIVE METABOLIC PANEL - Abnormal; Notable for the following:    Chloride 98 (*)    Glucose, Bld 157 (*)    BUN 88 (*)    Creatinine, Ser 9.17 (*)    Total Protein 6.1 (*)    AST 68 (*)    ALT 104 (*)    GFR calc non Af Amer 4 (*)    GFR calc Af Amer 4 (*)    Anion gap 21 (*)    All other components within normal limits  PROTIME-INR - Abnormal; Notable for the following:    Prothrombin Time 15.5 (*)    All other components within normal limits  I-STAT CG4 LACTIC ACID, ED - Abnormal; Notable for the following:    Lactic Acid, Venous 3.37 (*)    All other components within normal limits  I-STAT CG4 LACTIC ACID, ED -  Abnormal; Notable for the  following:    Lactic Acid, Venous 2.34 (*)    All other components within normal limits  APTT    Imaging Review Dg Chest Portable 1 View  04/09/2015  CLINICAL DATA:  Shortness of breath with extreme fatigue, weakness and possible fever. History of hypertension, diabetes, congestive heart failure and hemodialysis. EXAM: PORTABLE CHEST 1 VIEW COMPARISON:  04/01/2015 and 03/18/2015. FINDINGS: 1118 hours. There is stable cardiomegaly with chronic vascular congestion and central airway thickening. No overt pulmonary edema, confluent airspace opacity, pleural effusion or pneumothorax. Vascular stent in the left upper arm and metallic foreign body in the right supraclavicular fossa are unchanged. No acute osseous findings are seen. IMPRESSION: Cardiomegaly with chronic vascular congestion and central airway thickening, but no overt edema or focal airspace disease. Electronically Signed   By: Richardean Sale M.D.   On: 04/09/2015 11:55   I have personally reviewed and evaluated these images and lab results as part of my medical decision-making.   EKG Interpretation None      MDM   Final diagnoses:  Left leg pain  Decreased dorsalis pedis pulse  Pain    Filed Vitals:   04/09/15 0815 04/09/15 0900 04/09/15 1000 04/09/15 1100  BP: 159/70 149/50 132/56 137/60  Pulse: 69 71 66 65  Temp:      TempSrc:      Resp:      Height:      Weight:      SpO2: 99% 98% 98% 99%    Medications  HYDROmorphone (DILAUDID) injection 0.5 mg (0.5 mg Intravenous Given 04/09/15 0850)    Stefanie [name] is 70 y.o. female presenting with severe pain to left lower extremity, this is atraumatic. Patient had a DVT in the left lower extremity last year, she's been compliant with her Coumadin however she is subtherapeutic. Patient has normal motor function, 2+ posterior tibial pulse, dorsalis pedis is nonpalpable and not auscultated by Doppler. Vascular surgery consult from Dr. Trula Slade appreciated: He is reassured by  the flow in the posterior tibial artery and doubts that this is a acute arterial occlusion considering the good collateral flow. Recommend starting with DVT ultrasound and can add on arterial.  This is a shared visit with the attending physician who personally evaluated the patient and agrees with the care plan.    Subacute DVT, ABI 0.65 Will obtain arterial duplex. Vascular technician has called [facility] that there is monophasic flow throughout the left leg. Attending will d/w vascular, who has evaluated the patient and did not think the abnormalities in blood flow is the source of this patient's pain. Considering this patient's elevated lactic acid and the warmth to the lower extremity she will be started on Vanco and Zosyn for presumed cellulitis. There is no overt infection at the fistula site. Will obtain angiogram of left lower extremity to evaluate blood flow and look for deep tissue infection. Considering this patient's lack of dialysis, her strong blood pressure and lack of tachycardia we will not initiate the sepsis protocol fluid resuscitation. Patient's lactic acid is improving with no fluid resuscitation. Patient will be given 500 mL bolus.  Case signed out to PA Harris at shift change: Plan is to follow-up angiogram of left lower extremity and evaluate for deep tissue infection. She will need an unassigned admission for cellulitis and elevated lactic acid.      Monico Blitz, PA-C 04/09/15 [place], MD 04/09/15 [phone]

## 2015-04-09 NOTE — ED Provider Notes (Signed)
5:59 PM BP 135/77 mmHg  Pulse 62  Temp(Src) 97.4 F (36.3 C) (Oral)  Resp 20  Ht 5\' 4"  (1.626 m)  Wt 75.751 kg  BMI 28.65 kg/m2  SpO2 100%  [name] Birchall is a(n) 70 y.o. female who is given in sign out from [place]. She  has a past medical history of Hypertension; Hyperlipidemia; Diabetes mellitus; CVA (cerebral vascular accident) ([place]) ([place]); Protein malnutrition ([place]); Secondary hyperparathyroidism ([place]); Anemia; End stage renal disease ([place]); echocardiogram; cardiovascular stress test; CHF (congestive heart failure) ([place]); Retinal detachment, bilateral; CAD (coronary artery disease); and Renal insufficiency. She came in with leg pain and there was concern for acute vascular occlusion or DVT. Currently awaiting arteriogram.    jPatient with chronic DVT and no arteriofram . Will admit for lactic acidosis and cellulitis.  Patient will be admitted to inpatient.   Margarita Mail, PA-C 04/10/15 1352  Charlesetta Shanks, MD 04/12/15 [phone]

## 2015-04-09 NOTE — Progress Notes (Addendum)
*  PRELIMINARY RESULTS* Vascular Ultrasound Lower Extremity Arterial Duplex has been completed.  Preliminary findings: Monophasic flow noted throughout the left lower extremity. No focal areas of stenosis or obstruction. The right common femoral artery flow is biphasic.   ABI completed:   RIGHT    LEFT    PRESSURE WAVEFORM  PRESSURE WAVEFORM  BRACHIAL 151 Bi BRACHIAL 148 Bi  DP   DP    AT 162 Bi AT 111 [place]  PT 143 Bi PT 100 [place]  PER   PER    GREAT TOE  NA GREAT TOE  NA    RIGHT LEFT  ABI 1.07, WNL 0.74, moderate arterial disease      Landry Mellow, RDMS, RVT  04/09/2015, 1:06 PM

## 2015-04-09 NOTE — Consult Note (Signed)
Consult Note  Patient name: Stefanie [name] MRN: [id number] DOB: [birth year] Sex: female  Consulting Physician:  Dr. Billy Fischer (ER)  Reason for Consult:  Chief Complaint  Patient presents with  . Leg Pain    HISTORY OF PRESENT ILLNESS: This is a 70 year old female with history of end-stage renal disease who has had a left thigh dialysis graft in place for approximately 5 years.  She began complaining of left lower extremity pain beginning yesterday.  She has a history of DVT in that leg 1 year ago.  She has been on Coumadin.  This doesn't appear to feel similar to her pain experienced with her DVT.  She denies any trauma to the area.  The pain is in her calf.  She states she does not have pain in her toes or foot.  She does not notice any motor or sensory loss in the foot.  The patient suffers from diabetes.  Her most recent hemoglobin A1c was 6.0.  She takes a statin for hypercholesterolemia.  Blood pressure is medically managed.  Past Medical History  Diagnosis Date  . Hypertension   . Hyperlipidemia   . Diabetes mellitus   . CVA (cerebral vascular accident) ([place]) [place]    left sided weakness  . Protein malnutrition ([place])   . Secondary hyperparathyroidism ([place])   . Anemia   . End stage renal disease ([place])     initiated HD 2006  . Hx of echocardiogram     a. Echo 03/2010: EF 55-60%, Gr 1 diast dysfn, trivial MS, mild to mod LAE, PASP 34, ;  b. Echo 11/13: mild LVH, EF 60%, Gr 1 diast dysfn, Ao sclerosis, no AS, MAC, slight MS, mean 3 mmHg, PASP 34  . Hx of cardiovascular stress test     a. MV 03/2010:  no ischemia, EF 55%  . CHF (congestive heart failure) ([place])   . Retinal detachment, bilateral   . CAD (coronary artery disease)     Past Surgical History  Procedure Laterality Date  . Cataract extraction    . Tubal ligation    . Arteriovenous graft placement      BUE numerous times  . Arteriovenous graft placement  11/13/10    Lt femoral - Dr. Kellie Simmering  . Dg av  dialysis graft declot or  06/06/11, 08/26/11    performed in IR  . Shuntogram N/A 05/29/2013    Procedure: Earney Mallet;  Surgeon: Conrad [place], MD;  Location: [facility] CATH LAB;  Service: Cardiovascular;  Laterality: N/A;  . Shuntogram Left 03/01/2014    Procedure: SHUNTOGRAM;  Surgeon: Conrad [place], MD;  Location: [facility] CATH LAB;  Service: Cardiovascular;  Laterality: Left;  . Cardiac catheterization N/A 02/05/2015    Procedure: Left Heart Cath and Coronary Angiography;  Surgeon: Jettie Booze, MD;  Location: [place] CV LAB;  Service: Cardiovascular;  Laterality: N/A;  . Peripheral vascular catheterization N/A 02/06/2015    Procedure: A/V Shuntogram;  Surgeon: Elam Dutch, MD;  Location: [place] CV LAB;  Service: Cardiovascular;  Laterality: N/A;    Social History   Social History  . Marital Status: Single    Spouse Name: N/A  . Number of Children: 5  . Years of Education: N/A   Occupational History  .     Social History Main Topics  . Smoking status: Former Smoker    Quit date: 03/17/1983  . Smokeless tobacco: Never Used  . Alcohol Use: No  .  Drug Use: No  . Sexual Activity: Not on file   Other Topics Concern  . Not on file   Social History Narrative    Family History  Problem Relation Age of Onset  . Cardiomyopathy    . Heart disease Mother   . Diabetes Mother   . Hypertension Mother   . Heart attack Mother   . Diabetes Other   . Kidney disease Other   . Diabetes Daughter   . Diabetes Son   . Hypertension Son     Allergies as of 04/09/2015  . (No Known Allergies)    No current facility-administered medications on file prior to encounter.   Current Outpatient Prescriptions on File Prior to Encounter  Medication Sig Dispense Refill  . acetaminophen (TYLENOL) 325 MG tablet Take 1-2 tablets (325-650 mg total) by mouth every 4 (four) hours as needed for mild pain.    . calcium acetate (ELIPHOS) 667 MG tablet Take 667 mg by mouth 3 (three) times daily  with meals. Take 3 capsule in the morning, 2 capsules with a snack and 3 capsules in the evening.    . cinacalcet (SENSIPAR) 90 MG tablet Take 90 mg by mouth daily.    . folic acid-vitamin b complex-vitamin c-selenium-zinc (DIALYVITE) 3 MG TABS tablet Take 1 tablet by mouth daily.    Marland Kitchen gabapentin (NEURONTIN) 300 MG capsule Take 300 mg by mouth at bedtime.    Marland Kitchen guaiFENesin (MUCINEX) 600 MG 12 hr tablet Take 1 tablet (600 mg total) by mouth 2 (two) times daily as needed for cough.    . insulin glargine (LANTUS) 100 UNIT/ML injection Inject 0.1 mLs (10 Units total) into the skin at bedtime. 10 mL 11  . latanoprost (XALATAN) 0.005 % ophthalmic solution Place 1 drop into the left eye at bedtime.    . nitroGLYCERIN (NITROSTAT) 0.4 MG SL tablet Place 1 tablet (0.4 mg total) under the tongue every 5 (five) minutes as needed for chest pain.  12  . polyethylene glycol (MIRALAX / GLYCOLAX) packet Take 17 g by mouth daily as needed. (Patient taking differently: Take 17 g by mouth daily. ) 14 each 0  . pravastatin (PRAVACHOL) 40 MG tablet Take 40 mg by mouth daily.     Marland Kitchen PROAIR HFA 108 (90 BASE) MCG/ACT inhaler Inhale 1 puff into the lungs every 4 (four) hours as needed for wheezing or shortness of breath.     Marland Kitchen rOPINIRole (REQUIP) 0.5 MG tablet Take 0.5 mg by mouth at bedtime.  12  . saccharomyces boulardii (FLORASTOR) 250 MG capsule Take 1 capsule (250 mg total) by mouth 2 (two) times daily.    . VOLTAREN 1 % GEL APPLY 2 GRAMS TO AREA UP TO 4 TIMES A DAY  2  . calcitRIOL (ROCALTROL) 0.5 MCG capsule Take 3 capsules (1.5 mcg total) by mouth Every Tuesday,Thursday,and Saturday with dialysis. (Patient not taking: Reported on 04/09/2015)    . carvedilol (COREG) 3.125 MG tablet Take 1 tablet (3.125 mg total) by mouth 2 (two) times daily with a meal. (Patient not taking: Reported on 04/09/2015)    . Darbepoetin Alfa (ARANESP) 100 MCG/0.5ML SOSY injection Inject 0.5 mLs (100 mcg total) into the vein every Tuesday with  hemodialysis. (Patient not taking: Reported on 04/09/2015) 4.2 mL   . ketoconazole (NIZORAL) 2 % cream Apply 1 application topically daily. (Patient not taking: Reported on 04/09/2015) 15 g 0  . lidocaine-prilocaine (EMLA) cream Apply 1 application topically as needed (topical anesthesia for hemodialysis if Gebauers  and Lidocaine injection are ineffective.). (Patient not taking: Reported on 04/09/2015) 30 g 0  . Menthol-Methyl Salicylate (MUSCLE RUB) 10-15 % CREA Apply 1 application topically 2 (two) times daily as needed for muscle pain. (Patient not taking: Reported on 04/09/2015)  0  . multivitamin (RENA-VIT) TABS tablet Take 1 tablet by mouth at bedtime. (Patient not taking: Reported on 04/09/2015)  0  . polycarbophil (FIBERCON) 625 MG tablet Take 2 tablets (1,250 mg total) by mouth daily. (Patient not taking: Reported on 04/09/2015)    . warfarin (COUMADIN) 5 MG tablet Take 7.5 mg today with supper. Alternate 5 mg with 7.5 every other day. (Patient not taking: Reported on 04/09/2015)       REVIEW OF SYSTEMS: See history of present illness.  All other review of systems are negative  PHYSICAL EXAMINATION: General: The patient appears their stated age.  Vital signs are BP 142/62 mmHg  Pulse 58  Temp(Src) 97.4 F (36.3 C) (Oral)  Resp 14  Ht 5\' 4"  (1.626 m)  Wt 167 lb (75.751 kg)  BMI 28.65 kg/m2  SpO2 100% Pulmonary: Respirations are non-labored HEENT:  No gross abnormalities Abdomen: Soft and non-tender  Musculoskeletal: Left calf tenderness.  Sensitive to the touch Neurologic: No motor or sensory loss in either lower extremities years she has normal movement of the toes on her left foot as well as normal sensation Skin: No open wounds.  No erythema Psychiatric: The patient has normal affect. Cardiovascular: There is a regular rate and rhythm without significant murmur appreciated.  Palpable thrill within left thigh dialysis graft.  Doppler signal with brisk flow in the left posterior  tibial artery    Diagnostic Studies: I have personally reviewed her ultrasound studies and discussed them with the patient.  Her ABI on the right is 1.07.  On the left is 0.74.  No acute arterial pathology is noted.  There is no evidence of acute DVT.    Assessment:  Left leg pain Plan: After evaluating the patient and reviewing her ultrasound studies, I do not think that her pain is from vascular insufficiency.  She has a brisk Doppler signal in her posterior tibial artery.  Ankle brachial index is 0.74.  She has normal motor and sensory function of the foot.  The foot is warm as is the entire leg.  She has severe tenderness in the calf.  Her ultrasound of the venous system was negative for acute DVT.  I do not think the source of her discomfort is vascular in nature.     Eldridge Abrahams, M.D. [facility] Office: [phone] Pager:  [phone]

## 2015-04-09 NOTE — ED Notes (Signed)
Pt brought from dialysis, did not receive any of treatment, pt c/o left lower leg pain.  Pt has hx of DVT to the left leg.  Pt reports the pain began last night and denies any injury.  Pulses present and pt able to move extremity.  Discoloration noted to LLE ( darker than right).

## 2015-04-10 ENCOUNTER — Encounter: Payer: Medicare Other | Admitting: Physician Assistant

## 2015-04-10 DIAGNOSIS — M79605 Pain in left leg: Secondary | ICD-10-CM | POA: Insufficient documentation

## 2015-04-10 DIAGNOSIS — L03116 Cellulitis of left lower limb: Principal | ICD-10-CM

## 2015-04-10 DIAGNOSIS — E872 Acidosis, unspecified: Secondary | ICD-10-CM | POA: Insufficient documentation

## 2015-04-10 DIAGNOSIS — I739 Peripheral vascular disease, unspecified: Secondary | ICD-10-CM

## 2015-04-10 DIAGNOSIS — R7881 Bacteremia: Secondary | ICD-10-CM | POA: Diagnosis present

## 2015-04-10 LAB — MRSA PCR SCREENING: MRSA by PCR: NEGATIVE

## 2015-04-10 LAB — GLUCOSE, CAPILLARY
GLUCOSE-CAPILLARY: 177 mg/dL — AB (ref 65–99)
GLUCOSE-CAPILLARY: 151 mg/dL — AB (ref 65–99)
Glucose-Capillary: 365 mg/dL — ABNORMAL HIGH (ref 65–99)
Glucose-Capillary: 76 mg/dL (ref 65–99)
Glucose-Capillary: 115 mg/dL — ABNORMAL HIGH (ref 65–99)

## 2015-04-10 MED ORDER — ONDANSETRON HCL 4 MG PO TABS: 4.0000 mg | ORAL_TABLET | Freq: Four times a day (QID) | ORAL | Status: DC | PRN

## 2015-04-10 MED ORDER — CALCIUM ACETATE (PHOS BINDER) 667 MG PO CAPS
2001.0000 mg | ORAL_CAPSULE | Freq: Two times a day (BID) | ORAL | Status: DC
  Administered 2015-04-10 – 2015-04-19 (×16): 2001 mg via ORAL
  Filled 2015-04-10 (×17): qty 3

## 2015-04-10 MED ORDER — INSULIN ASPART 100 UNIT/ML ~~LOC~~ SOLN
0.0000 [IU] | Freq: Three times a day (TID) | SUBCUTANEOUS | Status: DC
Start: 2015-04-10 — End: 2015-04-10

## 2015-04-10 MED ORDER — LISINOPRIL 20 MG PO TABS
30.0000 mg | ORAL_TABLET | Freq: Every day | ORAL | Status: DC
  Administered 2015-04-10: 30 mg via ORAL
  Filled 2015-04-10: qty 1

## 2015-04-10 MED ORDER — ROPINIROLE HCL 1 MG PO TABS
0.5000 mg | ORAL_TABLET | Freq: Every day | ORAL | Status: DC
  Administered 2015-04-10 – 2015-04-18 (×9): 0.5 mg via ORAL
  Filled 2015-04-10 (×4): qty 1
  Filled 2015-04-10: qty 0.5
  Filled 2015-04-10 (×6): qty 1

## 2015-04-10 MED ORDER — NITROGLYCERIN 0.4 MG SL SUBL: 0.4000 mg | SUBLINGUAL_TABLET | SUBLINGUAL | Status: DC | PRN

## 2015-04-10 MED ORDER — SACCHAROMYCES BOULARDII 250 MG PO CAPS
250.0000 mg | ORAL_CAPSULE | Freq: Two times a day (BID) | ORAL | Status: DC
  Administered 2015-04-10 – 2015-04-19 (×19): 250 mg via ORAL
  Filled 2015-04-10 (×19): qty 1

## 2015-04-10 MED ORDER — GABAPENTIN 300 MG PO CAPS
300.0000 mg | ORAL_CAPSULE | Freq: Every day | ORAL | Status: DC
  Administered 2015-04-10 – 2015-04-13 (×4): 300 mg via ORAL
  Filled 2015-04-10 (×4): qty 1

## 2015-04-10 MED ORDER — INSULIN ASPART 100 UNIT/ML ~~LOC~~ SOLN
0.0000 [IU] | Freq: Three times a day (TID) | SUBCUTANEOUS | Status: DC
  Administered 2015-04-10: 3 [IU] via SUBCUTANEOUS
  Administered 2015-04-11 – 2015-04-14 (×2): 2 [IU] via SUBCUTANEOUS
  Administered 2015-04-14: 3 [IU] via SUBCUTANEOUS
  Administered 2015-04-15: 2 [IU] via SUBCUTANEOUS
  Administered 2015-04-15 – 2015-04-17 (×3): 3 [IU] via SUBCUTANEOUS
  Administered 2015-04-17: 2 [IU] via SUBCUTANEOUS
  Administered 2015-04-18 (×2): 3 [IU] via SUBCUTANEOUS
  Administered 2015-04-19: 2 [IU] via SUBCUTANEOUS

## 2015-04-10 MED ORDER — ONDANSETRON HCL 4 MG/2ML IJ SOLN: 4.0000 mg | Freq: Four times a day (QID) | INTRAMUSCULAR | Status: DC | PRN

## 2015-04-10 MED ORDER — POLYETHYLENE GLYCOL 3350 17 G PO PACK: 17.0000 g | PACK | Freq: Every day | ORAL | Status: DC | PRN

## 2015-04-10 MED ORDER — GUAIFENESIN ER 600 MG PO TB12: 600.0000 mg | ORAL_TABLET | Freq: Two times a day (BID) | ORAL | Status: DC | PRN

## 2015-04-10 MED ORDER — CINACALCET HCL 30 MG PO TABS
90.0000 mg | ORAL_TABLET | Freq: Every day | ORAL | Status: DC
  Administered 2015-04-10 – 2015-04-19 (×10): 90 mg via ORAL
  Filled 2015-04-10 (×10): qty 3

## 2015-04-10 MED ORDER — INSULIN GLARGINE 100 UNIT/ML ~~LOC~~ SOLN
10.0000 [IU] | Freq: Every day | SUBCUTANEOUS | Status: DC
  Administered 2015-04-10 – 2015-04-18 (×10): 10 [IU] via SUBCUTANEOUS
  Filled 2015-04-10 (×11): qty 0.1

## 2015-04-10 MED ORDER — APIXABAN 5 MG PO TABS
5.0000 mg | ORAL_TABLET | Freq: Two times a day (BID) | ORAL | Status: DC
  Administered 2015-04-10 – 2015-04-11 (×3): 5 mg via ORAL
  Filled 2015-04-10 (×3): qty 1

## 2015-04-10 MED ORDER — INSULIN ASPART 100 UNIT/ML ~~LOC~~ SOLN
4.0000 [IU] | Freq: Once | SUBCUTANEOUS | Status: AC
  Administered 2015-04-10: 4 [IU] via SUBCUTANEOUS

## 2015-04-10 MED ORDER — HYDROCODONE-ACETAMINOPHEN 5-325 MG PO TABS
1.0000 | ORAL_TABLET | ORAL | Status: DC | PRN
  Administered 2015-04-10 – 2015-04-17 (×6): 2 via ORAL
  Filled 2015-04-10 (×6): qty 2

## 2015-04-10 MED ORDER — RENA-VITE PO TABS
1.0000 | ORAL_TABLET | Freq: Every day | ORAL | Status: DC
  Administered 2015-04-10 – 2015-04-18 (×9): 1 via ORAL
  Filled 2015-04-10 (×9): qty 1

## 2015-04-10 MED ORDER — LATANOPROST 0.005 % OP SOLN
1.0000 [drp] | Freq: Every day | OPHTHALMIC | Status: DC
  Administered 2015-04-10 – 2015-04-18 (×8): 1 [drp] via OPHTHALMIC
  Filled 2015-04-10 (×3): qty 2.5

## 2015-04-10 MED ORDER — ALBUTEROL SULFATE (2.5 MG/3ML) 0.083% IN NEBU: 2.5000 mg | INHALATION_SOLUTION | RESPIRATORY_TRACT | Status: DC | PRN

## 2015-04-10 MED ORDER — CALCIUM ACETATE (PHOS BINDER) 667 MG PO TABS: 667.0000 mg | ORAL_TABLET | Freq: Three times a day (TID) | ORAL | Status: DC

## 2015-04-10 MED ORDER — HYDROMORPHONE HCL 1 MG/ML IJ SOLN
0.5000 mg | INTRAMUSCULAR | Status: DC | PRN
  Administered 2015-04-11: 0.5 mg via INTRAVENOUS
  Filled 2015-04-10: qty 1

## 2015-04-10 MED ORDER — CALCIUM ACETATE (PHOS BINDER) 667 MG PO CAPS
2001.0000 mg | ORAL_CAPSULE | ORAL | Status: DC | PRN
  Administered 2015-04-14 – 2015-04-16 (×2): 2001 mg via ORAL
  Filled 2015-04-10 (×2): qty 3

## 2015-04-10 MED ORDER — ACETAMINOPHEN 650 MG RE SUPP: 650.0000 mg | Freq: Four times a day (QID) | RECTAL | Status: DC | PRN

## 2015-04-10 MED ORDER — INSULIN ASPART 100 UNIT/ML ~~LOC~~ SOLN
0.0000 [IU] | Freq: Every day | SUBCUTANEOUS | Status: DC
  Administered 2015-04-15 – 2015-04-17 (×3): 2 [IU] via SUBCUTANEOUS

## 2015-04-10 MED ORDER — ACETAMINOPHEN 325 MG PO TABS
650.0000 mg | ORAL_TABLET | Freq: Four times a day (QID) | ORAL | Status: DC | PRN
  Filled 2015-04-10: qty 2

## 2015-04-10 MED ORDER — TRAZODONE HCL 50 MG PO TABS
25.0000 mg | ORAL_TABLET | Freq: Every evening | ORAL | Status: DC | PRN
  Administered 2015-04-10 – 2015-04-11 (×2): 25 mg via ORAL
  Filled 2015-04-10 (×2): qty 1

## 2015-04-10 MED ORDER — PRAVASTATIN SODIUM 40 MG PO TABS
40.0000 mg | ORAL_TABLET | Freq: Every day | ORAL | Status: DC
  Administered 2015-04-10 – 2015-04-18 (×8): 40 mg via ORAL
  Filled 2015-04-10 (×8): qty 1

## 2015-04-10 MED ORDER — ALBUTEROL SULFATE (2.5 MG/3ML) 0.083% IN NEBU
2.5000 mg | INHALATION_SOLUTION | RESPIRATORY_TRACT | Status: DC
  Administered 2015-04-10 (×2): 2.5 mg via RESPIRATORY_TRACT
  Filled 2015-04-10 (×2): qty 3

## 2015-04-10 NOTE — Progress Notes (Signed)
CRITICAL VALUE ALERT  Critical value received:  GRAM NEGATIVE RODS  IN PEDIATRIC BOTTLE   Date of notification:  04/10/15  Time of notification:  [id number]  Critical value read back:Yes.    Nurse who received alert:  Amado Coe, RN  MD notified (1st page):  Raliegh Ip Schorr  Time of first page:  [phone]

## 2015-04-10 NOTE — Progress Notes (Signed)
Utilization review complete. Akeia Perot RN CCM Case Mgmt phone [phone] 

## 2015-04-10 NOTE — Progress Notes (Signed)
New Admission Note:   Arrival Method: stretcher Mental Orientation:alert and  oriented Telemetry:17 Assessment: Completed Skin:intact [id number] a/c Pain:denies Tubes:none Safety Measures: Safety Fall Prevention Plan has been , discussed  Admission: Completed [place] Orientation: Patient has been orientated to the room, unit and staff.  Family:not present  Orders have been reviewed and implemented. Will continue to monitor the patient. Call light has been placed within reach and bed alarm has been activated.   Amado Coe, RN Phone number: [phone]

## 2015-04-10 NOTE — Consult Note (Signed)
[facility] Renal Consultation Note  Indication for Consultation:  Management of ESRD/hemodialysis; anemia, hypertension/volume and secondary hyperparathyroidism  HPI: Stefanie [name] is a 70 y.o. female admitted with Left leg   Cellulitis/pain . HO DVT on Coumadin. Patient evaluated by vascular surgery yesterday upon a not likely from vascular insufficiency. ESRD  TTS ( [facility] ) HD unable to complete hd yesterday sec leg pain and sent for evaluation . She denies fevers,cjills, chest pain , sob, GI symptoms. She has a left Femoral AVGG placed 5 years ago with ho dvt 1 year ago in Left leg and on Coumadin.      Past Medical History  Diagnosis Date  . Hypertension   . Hyperlipidemia   . Diabetes mellitus   . CVA (cerebral vascular accident) ([place]) [place]    left sided weakness  . Protein malnutrition ([place])   . Secondary hyperparathyroidism ([place])   . Anemia   . End stage renal disease ([place])     initiated HD 2006  . Hx of echocardiogram     a. Echo 03/2010: EF 55-60%, Gr 1 diast dysfn, trivial MS, mild to mod LAE, PASP 34, ;  b. Echo 11/13: mild LVH, EF 60%, Gr 1 diast dysfn, Ao sclerosis, no AS, MAC, slight MS, mean 3 mmHg, PASP 34  . Hx of cardiovascular stress test     a. MV 03/2010:  no ischemia, EF 55%  . CHF (congestive heart failure) ([place])   . Retinal detachment, bilateral   . CAD (coronary artery disease)   . Renal insufficiency     Past Surgical History  Procedure Laterality Date  . Cataract extraction    . Tubal ligation    . Arteriovenous graft placement      BUE numerous times  . Arteriovenous graft placement  11/13/10    Lt femoral - Dr. Kellie Simmering  . Dg av dialysis graft declot or  06/06/11, 08/26/11    performed in IR  . Shuntogram N/A 05/29/2013    Procedure: Earney Mallet;  Surgeon: Conrad [place], MD;  Location: [facility] CATH LAB;  Service: Cardiovascular;  Laterality: N/A;  . Shuntogram Left 03/01/2014    Procedure: SHUNTOGRAM;  Surgeon: Conrad [place], MD;   Location: [facility] CATH LAB;  Service: Cardiovascular;  Laterality: Left;  . Cardiac catheterization N/A 02/05/2015    Procedure: Left Heart Cath and Coronary Angiography;  Surgeon: Jettie Booze, MD;  Location: [place] CV LAB;  Service: Cardiovascular;  Laterality: N/A;  . Peripheral vascular catheterization N/A 02/06/2015    Procedure: A/V Shuntogram;  Surgeon: Elam Dutch, MD;  Location: [place] CV LAB;  Service: Cardiovascular;  Laterality: N/A;      Family History  Problem Relation Age of Onset  . Cardiomyopathy    . Heart disease Mother   . Diabetes Mother   . Hypertension Mother   . Heart attack Mother   . Diabetes Other   . Kidney disease Other   . Diabetes Daughter   . Diabetes Son   . Hypertension Son       reports that she quit smoking about 32 years ago. She has never used smokeless tobacco. She reports that she does not drink alcohol or use illicit drugs.  No Known Allergies  Prior to Admission medications   Medication Sig Start Date End Date Taking? Authorizing Provider  acetaminophen (TYLENOL) 325 MG tablet Take 1-2 tablets (325-650 mg total) by mouth every 4 (four) hours as needed for mild pain. 02/25/15  Yes Ivan Anchors Love, PA-C  apixaban (ELIQUIS) 5 MG TABS tablet Take 5 mg by mouth 2 (two) times daily.   Yes Historical Provider, MD  calcium acetate (ELIPHOS) 667 MG tablet Take 667 mg by mouth 3 (three) times daily with meals. Take 3 capsule in the morning, 2 capsules with a snack and 3 capsules in the evening.   Yes Historical Provider, MD  cinacalcet (SENSIPAR) 90 MG tablet Take 90 mg by mouth daily.   Yes Historical Provider, MD  folic acid-vitamin b complex-vitamin c-selenium-zinc (DIALYVITE) 3 MG TABS tablet Take 1 tablet by mouth daily.   Yes Historical Provider, MD  gabapentin (NEURONTIN) 300 MG capsule Take 300 mg by mouth at bedtime.   Yes Historical Provider, MD  guaiFENesin (MUCINEX) 600 MG 12 hr tablet Take 1 tablet (600 mg total) by mouth 2  (two) times daily as needed for cough. 02/25/15  Yes Ivan Anchors Love, PA-C  insulin glargine (LANTUS) 100 UNIT/ML injection Inject 0.1 mLs (10 Units total) into the skin at bedtime. 02/25/15  Yes Ivan Anchors Love, PA-C  latanoprost (XALATAN) 0.005 % ophthalmic solution Place 1 drop into the left eye at bedtime.   Yes Historical Provider, MD  lisinopril (PRINIVIL,ZESTRIL) 30 MG tablet Take 30 mg by mouth at bedtime.   Yes Historical Provider, MD  nitroGLYCERIN (NITROSTAT) 0.4 MG SL tablet Place 1 tablet (0.4 mg total) under the tongue every 5 (five) minutes as needed for chest pain. 02/25/15  Yes Ivan Anchors Love, PA-C  polyethylene glycol (MIRALAX / GLYCOLAX) packet Take 17 g by mouth daily as needed. Patient taking differently: Take 17 g by mouth daily.  02/25/15  Yes Ivan Anchors Love, PA-C  pravastatin (PRAVACHOL) 40 MG tablet Take 40 mg by mouth daily.  02/11/14  Yes Historical Provider, MD  PROAIR HFA 108 (90 BASE) MCG/ACT inhaler Inhale 1 puff into the lungs every 4 (four) hours as needed for wheezing or shortness of breath.  01/13/14  Yes Historical Provider, MD  rOPINIRole (REQUIP) 0.5 MG tablet Take 0.5 mg by mouth at bedtime. 07/27/14  Yes Historical Provider, MD  saccharomyces boulardii (FLORASTOR) 250 MG capsule Take 1 capsule (250 mg total) by mouth 2 (two) times daily. 02/25/15  Yes Pamela S Love, PA-C  VOLTAREN 1 % GEL APPLY 2 GRAMS TO AREA UP TO 4 TIMES A DAY 09/25/14  Yes Historical Provider, MD  calcitRIOL (ROCALTROL) 0.5 MCG capsule Take 3 capsules (1.5 mcg total) by mouth Every Tuesday,Thursday,and Saturday with dialysis. Patient not taking: Reported on 04/09/2015 02/25/15   Ivan Anchors Love, PA-C  carvedilol (COREG) 3.125 MG tablet Take 1 tablet (3.125 mg total) by mouth 2 (two) times daily with a meal. Patient not taking: Reported on 04/09/2015 02/25/15   Ivan Anchors Love, PA-C  Darbepoetin Alfa (ARANESP) 100 MCG/0.5ML SOSY injection Inject 0.5 mLs (100 mcg total) into the vein every Tuesday with  hemodialysis. Patient not taking: Reported on 04/09/2015 02/25/15   Ivan Anchors Love, PA-C  ketoconazole (NIZORAL) 2 % cream Apply 1 application topically daily. Patient not taking: Reported on 04/09/2015 01/17/15   Landis Martins, DPM  lidocaine-prilocaine (EMLA) cream Apply 1 application topically as needed (topical anesthesia for hemodialysis if Gebauers and Lidocaine injection are ineffective.). Patient not taking: Reported on 04/09/2015 02/25/15   Ivan Anchors Love, PA-C  Menthol-Methyl Salicylate (MUSCLE RUB) 10-15 % CREA Apply 1 application topically 2 (two) times daily as needed for muscle pain. Patient not taking: Reported on 04/09/2015 02/25/15   Bary Leriche,  PA-C  multivitamin (RENA-VIT) TABS tablet Take 1 tablet by mouth at bedtime. Patient not taking: Reported on 04/09/2015 02/25/15   Ivan Anchors Love, PA-C  polycarbophil (FIBERCON) 625 MG tablet Take 2 tablets (1,250 mg total) by mouth daily. Patient not taking: Reported on 04/09/2015 02/25/15   Ivan Anchors Love, PA-C  warfarin (COUMADIN) 5 MG tablet Take 7.5 mg today with supper. Alternate 5 mg with 7.5 every other day. Patient not taking: Reported on 04/09/2015 02/25/15   Bary Leriche, PA-C     Anti-infectives    Start     Dose/Rate Route Frequency Ordered Stop   04/11/15 1200  vancomycin (VANCOCIN) IVPB 750 mg/150 ml premix     750 mg 150 mL/hr over 60 Minutes Intravenous Every T-Th-Sa (Hemodialysis) 04/09/15 1458     04/09/15 2300  piperacillin-tazobactam (ZOSYN) IVPB 2.25 g     2.25 g 100 mL/hr over 30 Minutes Intravenous Every 8 hours 04/09/15 1528     04/09/15 1500  piperacillin-tazobactam (ZOSYN) IVPB 3.375 g     3.375 g 100 mL/hr over 30 Minutes Intravenous  Once 04/09/15 1442 04/09/15 1539   04/09/15 1500  vancomycin (VANCOCIN) 1,500 mg in sodium chloride 0.9 % 500 mL IVPB     1,500 mg 250 mL/hr over 120 Minutes Intravenous  Once 04/09/15 1442 04/10/15 0017   04/09/15 1500  piperacillin-tazobactam (ZOSYN) IVPB 2.25 g  Status:   Discontinued     2.25 g 100 mL/hr over 30 Minutes Intravenous 3 times per day 04/09/15 1458 04/09/15 1528      Results for orders placed or performed during the hospital encounter of 04/09/15 (from the past 48 hour(s))  CBC with Differential     Status: Abnormal   Collection Time: 04/09/15  8:42 AM  Result Value Ref Range   WBC 13.3 (H) 4.0 - 10.5 K/uL   RBC 3.52 (L) 3.87 - 5.11 MIL/uL   Hemoglobin 11.2 (L) 12.0 - 15.0 g/dL   HCT 33.9 (L) 36.0 - 46.0 %   MCV 96.3 78.0 - 100.0 fL   MCH 31.8 26.0 - 34.0 pg   MCHC 33.0 30.0 - 36.0 g/dL   RDW 18.2 (H) 11.5 - 15.5 %   Platelets 123 (L) 150 - 400 K/uL    Comment: PLATELET COUNT CONFIRMED BY SMEAR   Neutrophils Relative % 90 %   Neutro Abs 11.9 (H) 1.7 - 7.7 K/uL   Lymphocytes Relative 5 %   Lymphs Abs 0.7 0.7 - 4.0 K/uL   Monocytes Relative 5 %   Monocytes Absolute 0.7 0.1 - 1.0 K/uL   Eosinophils Relative 0 %   Eosinophils Absolute 0.0 0.0 - 0.7 K/uL   Basophils Relative 0 %   Basophils Absolute 0.0 0.0 - 0.1 K/uL  Comprehensive metabolic panel     Status: Abnormal   Collection Time: 04/09/15  8:42 AM  Result Value Ref Range   Sodium 143 135 - 145 mmol/L   Potassium 4.9 3.5 - 5.1 mmol/L   Chloride 98 (L) 101 - 111 mmol/L   CO2 24 22 - 32 mmol/L   Glucose, Bld 157 (H) 65 - 99 mg/dL   BUN 88 (H) 6 - 20 mg/dL   Creatinine, Ser 9.17 (H) 0.44 - 1.00 mg/dL   Calcium 9.7 8.9 - 10.3 mg/dL   Total Protein 6.1 (L) 6.5 - 8.1 g/dL   Albumin 3.8 3.5 - 5.0 g/dL   AST 68 (H) 15 - 41 U/L   ALT 104 (H) 14 - 54  U/L   Alkaline Phosphatase 70 38 - 126 U/L   Total Bilirubin 0.8 0.3 - 1.2 mg/dL   GFR calc non Af Amer 4 (L) >60 mL/min   GFR calc Af Amer 4 (L) >60 mL/min    Comment: (NOTE) The eGFR has been calculated using the CKD EPI equation. This calculation has not been validated in all clinical situations. eGFR's persistently <60 mL/min signify possible Chronic Kidney Disease.    Anion gap 21 (H) 5 - 15  Protime-INR     Status:  Abnormal   Collection Time: 04/09/15  8:42 AM  Result Value Ref Range   Prothrombin Time 15.5 (H) 11.6 - 15.2 seconds   INR 1.21 0.00 - 1.49  APTT     Status: None   Collection Time: 04/09/15  8:42 AM  Result Value Ref Range   aPTT 26 24 - 37 seconds  I-Stat CG4 Lactic Acid, ED     Status: Abnormal   Collection Time: 04/09/15  8:52 AM  Result Value Ref Range   Lactic Acid, Venous 3.37 (HH) 0.5 - 2.0 mmol/L   Comment NOTIFIED PHYSICIAN   I-Stat CG4 Lactic Acid, ED     Status: Abnormal   Collection Time: 04/09/15 11:52 AM  Result Value Ref Range   Lactic Acid, Venous 2.34 (HH) 0.5 - 2.0 mmol/L   Comment NOTIFIED PHYSICIAN   Blood culture (routine x 2)     Status: None (Preliminary result)   Collection Time: 04/09/15  2:09 PM  Result Value Ref Range   Specimen Description BLOOD RIGHT HAND    Special Requests BOTTLES DRAWN AEROBIC ONLY 3CC    Culture NO GROWTH < 24 HOURS    Report Status PENDING   Blood culture (routine x 2)     Status: None (Preliminary result)   Collection Time: 04/09/15  2:25 PM  Result Value Ref Range   Specimen Description BLOOD RIGHT ANTECUBITAL    Special Requests IN PEDIATRIC BOTTLE 2CC    Culture  Setup Time      GRAM NEGATIVE RODS IN PEDIATRIC BOTTLE CRITICAL RESULT CALLED TO, READ BACK BY AND VERIFIED WITH: CWICKER,RN '@[id number]'  04/10/15 MKELLY    Culture NO GROWTH < 24 HOURS    Report Status PENDING   Glucose, capillary     Status: Abnormal   Collection Time: 04/10/15  1:17 AM  Result Value Ref Range   Glucose-Capillary 365 (H) 65 - 99 mg/dL  MRSA PCR Screening     Status: None   Collection Time: 04/10/15  6:22 AM  Result Value Ref Range   MRSA by PCR NEGATIVE NEGATIVE    Comment:        The GeneXpert MRSA Assay (FDA approved for NASAL specimens only), is one component of a comprehensive MRSA colonization surveillance program. It is not intended to diagnose MRSA infection nor to guide or monitor treatment for MRSA infections.   Glucose,  capillary     Status: None   Collection Time: 04/10/15  8:09 AM  Result Value Ref Range   Glucose-Capillary 76 65 - 99 mg/dL   .  ROS: see hpi  Physical Exam: Filed Vitals:   04/10/15 0609 04/10/15 0855  BP: 147/63 138/57  Pulse: 67 62  Temp: 98.7 F (37.1 C) 98.5 F (36.9 C)  Resp: 21 20     General: alert AAF, NAD, Chronically ill  HEENT: [place],MMM, EOMI Neck:no jvd Heart: RRR, No mur, rub, gallop Lungs:  CTA  /Nonlabored breating Abdomen: Sl obese,NT,ND, BS pos  Extremities: Left  pedal edema trace , Skin: no overt rash/ left lower leg( calf) dark color/ tender to Palpitation /  no pedal ulcers  Neuro: Alert OX3 ,no acute focal deficits Dialysis Access: LFem AVGG pos bruit  Dialysis Orders: Center: [facility] on TTS. EDW 73.0 kg HD Bath 2k,2.ca Time 3hr 30 min Heparin 4000. Access L fem. avgg  Calcitriol 1.0 mcg  Po /HD/ Mircera 75 mcg  q 2wks last on 04/02/15 hd  Other op lab HGB 11 Ca 9.2 phos 3.5 pth 690    Assessment/Plan 1. L Lower Extremity Pain /  Cellulitis  l calf = VVS consulted, neg [place], Dr. Trula Slade =" Does not think  the source of her discomfort is vascular in nature" Has brisk Doppler signal in her posterior tibial artery. Ankle brachial index is 0.74 2. Bacteremia  ( 04/09/15 Blood culture pos for GNR on vaco /zosyn  /?source  3. ESRD -  Nl TTS  ([facility]) K4.9 and cxr showing no excess volume. Hd in am  On schedule 4. Hypertension/volume  - no excess vol bp ok / continue on Home Coreg And Lisinopril  5. 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 HO Diastolic Hrt failure = compensated / EF 35% 11/16 2d  7. Anemia  - hgb 11.2 ESA  Nest week hd  8. Metabolic bone disease -  Po vit  D on hd and binder /sensipar   Ernest Haber, PA-C [place] [phone] 04/10/2015, 12:36 PM   Pt seen, examined and agree w A/P as above.  ESRD patient with leg pain, swelling and tenderness, low grade fever and ^wbc.  On emp abx, 1/2 blood cx's already growing GNR's  which is unusual.  Plan HD tomorrow on schedule.  Kelly Splinter MD [employer] pager [phone]    cell [phone] 04/10/2015, 4:21 PM

## 2015-04-10 NOTE — H&P (Signed)
[facility] History and Physical  Stefanie S Rudd [id number] DOB: 1945/10/05 DOA: 04/09/2015  Referring physician:  PCP: Bonnita Nasuti, MD   Chief Complaint: left leg pain  HPI: Stefanie [name] is a very pleasant 70 y.o. female past medical history includes end-stage renal disease Tuesday Thursday Saturday dialysis schedule, diabetes, CVA, hypertension, DVT left leg presents to the emergency department from dialysis center in [place] with the chief complaint of left leg pain. Initial evaluation concerning for left leg DVT and/or cellulitis.  Information is obtained from the patient. She reports she went to dialysis center yesterday per her schedule and complained of left leg pain. The dialysis center staff evaluated her leg and transferred [facility] to [facility] as there was a concern for DVT and/or cellulitis. Patient reports pain is in the left leg started around the left foot/ankle about 3 days ago. Pain has progressed up her leg to about midcalf. Associated symptoms include swelling of the left foot and leg up to about the knee mild erythema and exquisite tenderness. She denies chest pain palpitation shortness of breath headache dizziness syncope or near-syncope. She denies any cough abdominal pain nausea vomiting diarrhea constipation. She denies fever chills or recent sick contacts  In the emergency department she is afebrile hemodynamically stable and not hypoxic. Vancomycin and Zosyn were initiated.   Review of Systems:  10 point review of systems complete and all systems are negative except as indicated in the history of present illness  Past Medical History  Diagnosis Date  . Hypertension   . Hyperlipidemia   . Diabetes mellitus   . CVA (cerebral vascular accident) ([place]) [place]    left sided weakness  . Protein malnutrition ([place])   . Secondary hyperparathyroidism ([place])   . Anemia   . End stage renal disease ([place])     initiated HD 2006  . Hx of echocardiogram     a.  Echo 03/2010: EF 55-60%, Gr 1 diast dysfn, trivial MS, mild to mod LAE, PASP 34, ;  b. Echo 11/13: mild LVH, EF 60%, Gr 1 diast dysfn, Ao sclerosis, no AS, MAC, slight MS, mean 3 mmHg, PASP 34  . Hx of cardiovascular stress test     a. MV 03/2010:  no ischemia, EF 55%  . CHF (congestive heart failure) ([place])   . Retinal detachment, bilateral   . CAD (coronary artery disease)   . Renal insufficiency    Past Surgical History  Procedure Laterality Date  . Cataract extraction    . Tubal ligation    . Arteriovenous graft placement      BUE numerous times  . Arteriovenous graft placement  11/13/10    Lt femoral - Dr. Kellie Simmering  . Dg av dialysis graft declot or  06/06/11, 08/26/11    performed in IR  . Shuntogram N/A 05/29/2013    Procedure: Earney Mallet;  Surgeon: Conrad St. Tammany, MD;  Location: [facility] CATH LAB;  Service: Cardiovascular;  Laterality: N/A;  . Shuntogram Left 03/01/2014    Procedure: SHUNTOGRAM;  Surgeon: Conrad Oasis, MD;  Location: [facility] CATH LAB;  Service: Cardiovascular;  Laterality: Left;  . Cardiac catheterization N/A 02/05/2015    Procedure: Left Heart Cath and Coronary Angiography;  Surgeon: Jettie Booze, MD;  Location: [place] CV LAB;  Service: Cardiovascular;  Laterality: N/A;  . Peripheral vascular catheterization N/A 02/06/2015    Procedure: A/V Shuntogram;  Surgeon: Elam Dutch, MD;  Location: [place] CV LAB;  Service: Cardiovascular;  Laterality: N/A;  Social History:  reports that she quit smoking about 32 years ago. She has never used smokeless tobacco. She reports that she does not drink alcohol or use illicit drugs.  No Known Allergies  Family History  Problem Relation Age of Onset  . Cardiomyopathy    . Heart disease Mother   . Diabetes Mother   . Hypertension Mother   . Heart attack Mother   . Diabetes Other   . Kidney disease Other   . Diabetes Daughter   . Diabetes Son   . Hypertension Son      Prior to Admission medications   Medication Sig  Start Date End Date Taking? Authorizing Provider  acetaminophen (TYLENOL) 325 MG tablet Take 1-2 tablets (325-650 mg total) by mouth every 4 (four) hours as needed for mild pain. 02/25/15  Yes Ivan Anchors Love, PA-C  apixaban (ELIQUIS) 5 MG TABS tablet Take 5 mg by mouth 2 (two) times daily.   Yes Historical Provider, MD  calcium acetate (ELIPHOS) 667 MG tablet Take 667 mg by mouth 3 (three) times daily with meals. Take 3 capsule in the morning, 2 capsules with a snack and 3 capsules in the evening.   Yes Historical Provider, MD  cinacalcet (SENSIPAR) 90 MG tablet Take 90 mg by mouth daily.   Yes Historical Provider, MD  folic acid-vitamin b complex-vitamin c-selenium-zinc (DIALYVITE) 3 MG TABS tablet Take 1 tablet by mouth daily.   Yes Historical Provider, MD  gabapentin (NEURONTIN) 300 MG capsule Take 300 mg by mouth at bedtime.   Yes Historical Provider, MD  guaiFENesin (MUCINEX) 600 MG 12 hr tablet Take 1 tablet (600 mg total) by mouth 2 (two) times daily as needed for cough. 02/25/15  Yes Ivan Anchors Love, PA-C  insulin glargine (LANTUS) 100 UNIT/ML injection Inject 0.1 mLs (10 Units total) into the skin at bedtime. 02/25/15  Yes Ivan Anchors Love, PA-C  latanoprost (XALATAN) 0.005 % ophthalmic solution Place 1 drop into the left eye at bedtime.   Yes Historical Provider, MD  lisinopril (PRINIVIL,ZESTRIL) 30 MG tablet Take 30 mg by mouth at bedtime.   Yes Historical Provider, MD  nitroGLYCERIN (NITROSTAT) 0.4 MG SL tablet Place 1 tablet (0.4 mg total) under the tongue every 5 (five) minutes as needed for chest pain. 02/25/15  Yes Ivan Anchors Love, PA-C  polyethylene glycol (MIRALAX / GLYCOLAX) packet Take 17 g by mouth daily as needed. Patient taking differently: Take 17 g by mouth daily.  02/25/15  Yes Ivan Anchors Love, PA-C  pravastatin (PRAVACHOL) 40 MG tablet Take 40 mg by mouth daily.  02/11/14  Yes Historical Provider, MD  PROAIR HFA 108 (90 BASE) MCG/ACT inhaler Inhale 1 puff into the lungs every 4 (four)  hours as needed for wheezing or shortness of breath.  01/13/14  Yes Historical Provider, MD  rOPINIRole (REQUIP) 0.5 MG tablet Take 0.5 mg by mouth at bedtime. 07/27/14  Yes Historical Provider, MD  saccharomyces boulardii (FLORASTOR) 250 MG capsule Take 1 capsule (250 mg total) by mouth 2 (two) times daily. 02/25/15  Yes Pamela S Love, PA-C  VOLTAREN 1 % GEL APPLY 2 GRAMS TO AREA UP TO 4 TIMES A DAY 09/25/14  Yes Historical Provider, MD  calcitRIOL (ROCALTROL) 0.5 MCG capsule Take 3 capsules (1.5 mcg total) by mouth Every Tuesday,Thursday,and Saturday with dialysis. Patient not taking: Reported on 04/09/2015 02/25/15   Ivan Anchors Love, PA-C  carvedilol (COREG) 3.125 MG tablet Take 1 tablet (3.125 mg total) by mouth 2 (two) times daily  with a meal. Patient not taking: Reported on 04/09/2015 02/25/15   Ivan Anchors Love, PA-C  Darbepoetin Alfa (ARANESP) 100 MCG/0.5ML SOSY injection Inject 0.5 mLs (100 mcg total) into the vein every Tuesday with hemodialysis. Patient not taking: Reported on 04/09/2015 02/25/15   Ivan Anchors Love, PA-C  ketoconazole (NIZORAL) 2 % cream Apply 1 application topically daily. Patient not taking: Reported on 04/09/2015 01/17/15   Landis Martins, DPM  lidocaine-prilocaine (EMLA) cream Apply 1 application topically as needed (topical anesthesia for hemodialysis if Gebauers and Lidocaine injection are ineffective.). Patient not taking: Reported on 04/09/2015 02/25/15   Ivan Anchors Love, PA-C  Menthol-Methyl Salicylate (MUSCLE RUB) 10-15 % CREA Apply 1 application topically 2 (two) times daily as needed for muscle pain. Patient not taking: Reported on 04/09/2015 02/25/15   Ivan Anchors Love, PA-C  multivitamin (RENA-VIT) TABS tablet Take 1 tablet by mouth at bedtime. Patient not taking: Reported on 04/09/2015 02/25/15   Ivan Anchors Love, PA-C  polycarbophil (FIBERCON) 625 MG tablet Take 2 tablets (1,250 mg total) by mouth daily. Patient not taking: Reported on 04/09/2015 02/25/15   Ivan Anchors Love, PA-C    warfarin (COUMADIN) 5 MG tablet Take 7.5 mg today with supper. Alternate 5 mg with 7.5 every other day. Patient not taking: Reported on 04/09/2015 02/25/15   Bary Leriche, PA-C   Physical Exam: Filed Vitals:   04/10/15 0000 04/10/15 0053 04/10/15 0609 04/10/15 0855  BP: 154/73 154/60 147/63 138/57  Pulse: 65 67 67 62  Temp:  98.8 F (37.1 C) 98.7 F (37.1 C) 98.5 F (36.9 C)  TempSrc:    Oral  Resp: 11 26 21 20   Height:      Weight:  75 kg (165 lb 5.5 oz)    SpO2: 100% 100% 100% 100%    Wt Readings from Last 3 Encounters:  04/10/15 75 kg (165 lb 5.5 oz)  03/07/15 76.658 kg (169 lb)  02/25/15 77.1 kg (169 lb 15.6 oz)    General:  Appears calm and comfortable Eyes: PERRL, normal lids, irises & conjunctiva ENT: grossly normal hearing, lips & tongue  Neck: no LAD, masses or thyromegaly Cardiovascular: RRR, no m/r/g. . left foot with swelling heat some induration up to mid leg behind the knee   Respiratory: CTA bilaterally, no w/r/r. I'll increased work of breathing with conversation  Abdomen: soft, ntnd Positive bowel sounds throughout Skin: no rash or induration seen on limited exam Musculoskeletal: grossly normal tone BUE/BLE Psychiatric: grossly normal mood and affect, speech fluent and appropriate Neurologic: grossly non-focal. Speech clear facial symmetry           Labs on Admission:  Basic Metabolic Panel:  Recent Labs Lab 04/09/15 0842  NA 143  K 4.9  CL 98*  CO2 24  GLUCOSE 157*  BUN 88*  CREATININE 9.17*  CALCIUM 9.7   Liver Function Tests:  Recent Labs Lab 04/09/15 0842  AST 68*  ALT 104*  ALKPHOS 70  BILITOT 0.8  PROT 6.1*  ALBUMIN 3.8   No results for input(s): LIPASE, AMYLASE in the last 168 hours. No results for input(s): AMMONIA in the last 168 hours. CBC:  Recent Labs Lab 04/09/15 0842  WBC 13.3*  NEUTROABS 11.9*  HGB 11.2*  HCT 33.9*  MCV 96.3  PLT 123*   Cardiac Enzymes: No results for input(s): CKTOTAL, CKMB,  CKMBINDEX, TROPONINI in the last 168 hours.  BNP (last 3 results) No results for input(s): BNP in the last 8760 hours.  ProBNP (last  3 results) No results for input(s): PROBNP in the last 8760 hours.  CBG:  Recent Labs Lab 04/10/15 0117 04/10/15 0809  GLUCAP 365* 76    Radiological Exams on Admission: Ct Angio Low Extrem Left W/cm &/or Wo/cm  04/09/2015  CLINICAL DATA:  Left lower extremity pain. History of end-stage renal disease with current left thigh dialysis graft. History of left lower extremity DVT 1 year ago. EXAM: CT ANGIOGRAPHY LOWER LEFT EXTREMITY TECHNIQUE: Multidetector CT was performed of the left lower extremity during arterial phase of contrast opacification with coronal and sagittal reformatted imaging. CONTRAST:  151mL OMNIPAQUE IOHEXOL 350 MG/ML SOLN COMPARISON:  None. FINDINGS: The visualized left common iliac artery shows calcified plaque. The common iliac origin and abdominal aorta were not imaged. No significant occlusive disease is seen involving the left external iliac or common femoral arteries. A patent loop dialysis graft is present in the thigh with arterial anastomosis to the profunda femoral artery. The graft shows mild pseudoaneurysm formation along the arterial limb. Distal graft anastomosis is well visualized with the common femoral vein and shows normal patency. Venous outflow stents extend from just above the venous anastomosis into the distal common iliac vein and shows normal patency. The left SFA shows scattered calcifications but is patent without significant occlusive disease. There is limited contrast opacification at the level of the popliteal artery and tibial arteries, limiting accurate assessment of arterial patency on the current study. This is likely partially related to the fact that the dialysis graft is creating relative steal of arterial contrast proximally. No obvious embolic disease is identified. No significant nonvascular findings. There is  no evidence to suggest infection or other inflammatory process of the extremity. Atrophy and fatty replacement of posterior calf musculature present. No abnormal fluid collections or evidence of hematoma. No evidence to suggest bony fracture, osteomyelitis or bone lesions. Review of the MIP images confirms the above findings. IMPRESSION: No significant occlusive disease identified of the arterial supply to the left lower extremity. Distal arterial opacification is quite limited due to relative steal of contrast by a left thigh dialysis graft. The graft is normally patent. No evidence to suggest infection or other acute process in the left lower extremity. No bone abnormalities identified. Electronically Signed   By: Aletta Edouard M.D.   On: 04/09/2015 17:04   Dg Chest Portable 1 View  04/09/2015  CLINICAL DATA:  Shortness of breath with extreme fatigue, weakness and possible fever. History of hypertension, diabetes, congestive heart failure and hemodialysis. EXAM: PORTABLE CHEST 1 VIEW COMPARISON:  04/01/2015 and 03/18/2015. FINDINGS: 1118 hours. There is stable cardiomegaly with chronic vascular congestion and central airway thickening. No overt pulmonary edema, confluent airspace opacity, pleural effusion or pneumothorax. Vascular stent in the left upper arm and metallic foreign body in the right supraclavicular fossa are unchanged. No acute osseous findings are seen. IMPRESSION: Cardiomegaly with chronic vascular congestion and central airway thickening, but no overt edema or focal airspace disease. Electronically Signed   By: Richardean Sale M.D.   On: 04/09/2015 11:55    EKG  Assessment/Plan Principal Problem:   Cellulitis Active Problems:   Essential hypertension   ESRD on dialysis [facility])   PAD (peripheral artery disease) ([facility])   Leukocytosis   Chronic diastolic congestive heart failure ([facility])   History of CVA with residual deficit   Bacteremia   #1. Cellulitis/pain left leg. Straight PVD  status post stenting to left common iliac vein and left common femoral vein. Patient evaluated by vascular surgery  yesterday upon a not likely from vascular insufficiency. Dated January 24 indicates brisk Doppler signal and ankle brachial index 0.74. She does have a mild leukocytosis and on exam left foot/ankle consistent with a cellulitis. Initial lactic acid elevated but trending down. She is afebrile and nontoxic appearing -Admit to telemetry -Continue vancomycin and Zosyn  #2. Bacteremia. Initial blood culture positive gram-negative rods. He is afebrile nontoxic appearing -Continue vancomycin -Continue Zosyn -Follow cultures and sensitivities for further decisions regarding antibiotics  #3. End-stage renal disease. She is a Tuesday Thursday Saturday dialysis patient. She did not have dialysis yesterday. creatinine is 9.17 on admission -Spoke to Dr. Tessa Lerner regarding dialysis -Continue home meds -Renal diet fluid restriction  #4. Diastolic heart failure. Appears compensated. Echo in November 2016 reveals an EF 35% and grade 2 diastolic dysfunction -Monitor daily weights -Obtain urine output -Continue home include lisinopril and Coreg.  #5. History DVT left leg. see #1. - coumadinPer pharmacy  6. Hypertension. Controlled on admission -Continue home meds    Code Status: full DVT Prophylaxis: Family Communication: none presemt Disposition Plan: home when ready  Time spent: 54 minutes  [facility]

## 2015-04-11 ENCOUNTER — Ambulatory Visit: Payer: Medicare Other | Admitting: Vascular Surgery

## 2015-04-11 LAB — CBC
HEMATOCRIT: 29.3 % — AB (ref 36.0–46.0)
HEMOGLOBIN: 9.5 g/dL — AB (ref 12.0–15.0)
MCH: 31.5 pg (ref 26.0–34.0)
MCHC: 32.4 g/dL (ref 30.0–36.0)
MCV: 97 fL (ref 78.0–100.0)
Platelets: 84 10*3/uL — ABNORMAL LOW (ref 150–400)
RBC: 3.02 MIL/uL — ABNORMAL LOW (ref 3.87–5.11)
RDW: 18.7 % — ABNORMAL HIGH (ref 11.5–15.5)
WBC: 6.1 10*3/uL (ref 4.0–10.5)

## 2015-04-11 LAB — GLUCOSE, CAPILLARY
GLUCOSE-CAPILLARY: 127 mg/dL — AB (ref 65–99)
GLUCOSE-CAPILLARY: 113 mg/dL — AB (ref 65–99)
GLUCOSE-CAPILLARY: 161 mg/dL — AB (ref 65–99)

## 2015-04-11 LAB — COMPREHENSIVE METABOLIC PANEL
ALBUMIN: 2.7 g/dL — AB (ref 3.5–5.0)
ALK PHOS: 61 U/L (ref 38–126)
ALT: 97 U/L — ABNORMAL HIGH (ref 14–54)
ANION GAP: 14 (ref 5–15)
AST: 50 U/L — AB (ref 15–41)
BUN: 97 mg/dL — ABNORMAL HIGH (ref 6–20)
CALCIUM: 8.8 mg/dL — AB (ref 8.9–10.3)
CO2: 25 mmol/L (ref 22–32)
Chloride: 100 mmol/L — ABNORMAL LOW (ref 101–111)
Creatinine, Ser: 11.2 mg/dL — ABNORMAL HIGH (ref 0.44–1.00)
GFR calc Af Amer: 4 mL/min — ABNORMAL LOW (ref >60)
GFR calc non Af Amer: 3 mL/min — ABNORMAL LOW (ref >60)
GLUCOSE: 117 mg/dL — AB (ref 65–99)
Potassium: 5 mmol/L (ref 3.5–5.1)
SODIUM: 139 mmol/L (ref 135–145)
Total Bilirubin: 0.9 mg/dL (ref 0.3–1.2)
Total Protein: 5.4 g/dL — ABNORMAL LOW (ref 6.5–8.1)

## 2015-04-11 LAB — HEMOGLOBIN A1C
Hgb A1c MFr Bld: 5.9 % — ABNORMAL HIGH (ref 4.8–5.6)
Mean Plasma Glucose: 123 mg/dL

## 2015-04-11 MED ORDER — APIXABAN 2.5 MG PO TABS
2.5000 mg | ORAL_TABLET | Freq: Two times a day (BID) | ORAL | Status: DC
  Administered 2015-04-11: 2.5 mg via ORAL
  Filled 2015-04-11: qty 1

## 2015-04-11 NOTE — Progress Notes (Addendum)
PROGRESS NOTE    Stefanie [name] [id number] DOB: 1945/11/01 DOA: 04/09/2015 PCP: Bonnita Nasuti, MD  HPI/Brief narrative 70 year old female patient with history of ESRD on TTS HD, DM, CVA, HTN, DVT LAD, presented from dialysis Center in [place] to [facility] ED on 04/09/15 with complaints of left leg pain. Vascular surgery evaluated ED and did not feel vascular insufficiency was etiology. Admitted for cellulitis of left leg and bacteremia. Nephrology following.   Assessment/Plan:   1. Left leg cellulitis: h/o PAD status post stenting to left common iliac vein and left common femoral vein. Patient evaluated by vascular surgery on 1/24 and pain felt not likely from vascular insufficiency. Venous Doppler negative for acute DVT. Started empirically on IV vancomycin and Zosyn. Improving. 2. GNR bacteremia: One of 2 blood cultures positive. Final results pending. Continue Zosyn pending final sensitivity results. Source not clear. 3. ESRD on TTS HD: Nephrology consulted and management per nephrology. 4. Chronic systolic CHF: Echo November 2016: LVEF 35% and grade 2 diastolic dysfunction. Compensated clinically but dry weight up by 7-8 kilos per nephrology who have discontinued lisinopril and plan to Max UF with HD today. Continue Coreg. 5. History of DVT left leg: Was on Coumadin initially when DVT first diagnosed in November 2016. At some point was seen at [facility] ED and started on Apixaban. Discussed extensively with pharmacy on 1/26: Apixaban not appropriate in patient with ESRD. Hence we will change to Coumadin per pharmacy. 6. Essential hypertension: Controlled. 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 Reasonable inpatient control. 8. Anemia and thrombocytopenia: Follow CBCs in a.m. 9. History of CVA with residual aphasia  DVT prophylaxis: Apixaban > change to Coumadin per pharmacy. Code Status: Full Family Communication: None at bedside Disposition Plan: DC to SNF  when medically stable, possibly in 2-3 days.   Consultants:  Vascular surgery  Nephrology  Procedures:  Hemodialysis  Antimicrobials:  Zosyn 1/24 >  Vancomycin 1/24 >  Subjective: Patient states that her left leg pain & denies any other complaints.  Objective: Filed Vitals:   04/11/15 1430 04/11/15 1500 04/11/15 1530 04/11/15 1600  BP: 154/76 111/47 110/68 126/35  Pulse: 62 64 64 63  Temp:      TempSrc:      Resp: 22 22 22 19   Height:      Weight:      SpO2:       temperature 97.47F, oxygen saturation 100%.  Intake/Output Summary (Last 24 hours) at 04/11/15 1653 Last data filed at 04/11/15 1400  Gross per 24 hour  Intake    620 ml  Output      0 ml  Net    620 ml   Filed Weights   04/10/15 0053 04/11/15 0446 04/11/15 1330  Weight: 75 kg (165 lb 5.5 oz) 80.6 kg (177 lb 11.1 oz) 81.3 kg (179 lb 3.7 oz)    Exam:  General exam: Pleasant middle-aged female lying comfortably in bed this morning. Respiratory system: Clear. No increased work of breathing. Cardiovascular system: S1 & S2 heard, RRR. No JVD, murmurs, gallops, clicks or pedal edema. Telemetry: SB in the 50s-SR in the 60s Gastrointestinal system: Abdomen is nondistended, soft and nontender. Normal bowel sounds heard. Central nervous system: Alert and oriented. No focal neurological deficits. Extremities: Symmetric 5 x 5 power. Left leg slightly warm and tender but no fluctuance or crepitus. Skin is hyperpigmented. Palpable left femoral AVG thrill. Nonfunctional left upper arm AVF.   Data Reviewed: Basic Metabolic Panel:  Recent Labs Lab 04/09/15 0842 04/11/15 0529  NA 143 139  K 4.9 5.0  CL 98* 100*  CO2 24 25  GLUCOSE 157* 117*  BUN 88* 97*  CREATININE 9.17* 11.20*  CALCIUM 9.7 8.8*   Liver Function Tests:  Recent Labs Lab 04/09/15 0842 04/11/15 0529  AST 68* 50*  ALT 104* 97*  ALKPHOS 70 61  BILITOT 0.8 0.9  PROT 6.1* 5.4*  ALBUMIN 3.8 2.7*   No results for input(s): LIPASE,  AMYLASE in the last 168 hours. No results for input(s): AMMONIA in the last 168 hours. CBC:  Recent Labs Lab 04/09/15 0842 04/11/15 0529  WBC 13.3* 6.1  NEUTROABS 11.9*  --   HGB 11.2* 9.5*  HCT 33.9* 29.3*  MCV 96.3 97.0  PLT 123* 84*   Cardiac Enzymes: No results for input(s): CKTOTAL, CKMB, CKMBINDEX, TROPONINI in the last 168 hours. BNP (last 3 results) No results for input(s): PROBNP in the last 8760 hours. CBG:  Recent Labs Lab 04/10/15 1308 04/10/15 1622 04/10/15 2148 04/11/15 0752 04/11/15 1149  GLUCAP 115* 177* 151* 127* 113*    Recent Results (from the past 240 hour(s))  Blood culture (routine x 2)     Status: None (Preliminary result)   Collection Time: 04/09/15  2:09 PM  Result Value Ref Range Status   Specimen Description BLOOD RIGHT HAND  Final   Special Requests BOTTLES DRAWN AEROBIC ONLY 3CC  Final   Culture NO GROWTH 2 DAYS  Final   Report Status PENDING  Incomplete  Blood culture (routine x 2)     Status: None (Preliminary result)   Collection Time: 04/09/15  2:25 PM  Result Value Ref Range Status   Specimen Description BLOOD RIGHT ANTECUBITAL  Final   Special Requests IN PEDIATRIC BOTTLE 2CC  Final   Culture  Setup Time   Final    GRAM NEGATIVE RODS IN PEDIATRIC BOTTLE CRITICAL RESULT CALLED TO, READ BACK BY AND VERIFIED WITH: CWICKER,RN @0554  04/10/15 MKELLY    Culture GRAM NEGATIVE RODS  Final   Report Status PENDING  Incomplete  MRSA PCR Screening     Status: None   Collection Time: 04/10/15  6:22 AM  Result Value Ref Range Status   MRSA by PCR NEGATIVE NEGATIVE Final    Comment:        The GeneXpert MRSA Assay (FDA approved for NASAL specimens only), is one component of a comprehensive MRSA colonization surveillance program. It is not intended to diagnose MRSA infection nor to guide or monitor treatment for MRSA infections.          Studies: No results found.      Scheduled Meds: . apixaban  5 mg Oral BID  .  calcium acetate  2,001 mg Oral BID WC  . cinacalcet  90 mg Oral Q breakfast  . gabapentin  300 mg Oral QHS  . insulin aspart  0-15 Units Subcutaneous TID WC  . insulin aspart  0-5 Units Subcutaneous QHS  . insulin glargine  10 Units Subcutaneous QHS  . latanoprost  1 drop Left Eye QHS  . multivitamin  1 tablet Oral QHS  . piperacillin-tazobactam (ZOSYN)  IV  2.25 g Intravenous Q8H  . pravastatin  40 mg Oral q1800  . rOPINIRole  0.5 mg Oral QHS  . saccharomyces boulardii  250 mg Oral BID  . vancomycin  750 mg Intravenous Q T,Th,Sa-HD   Continuous Infusions:   Principal Problem:   Cellulitis Active Problems:   Essential hypertension  ESRD on dialysis [facility])   PAD (peripheral artery disease) ([facility])   Leukocytosis   Chronic diastolic congestive heart failure (HCC)   History of CVA with residual deficit   Bacteremia   Lactic acidosis    Time spent: 30 minutes.    Vernell Leep, MD, FACP, FHM. [facility] Pager [phone]  If 7PM-7AM, please contact night-coverage [email or web address] Password [email or web address] 04/11/2015, 4:53 PM    LOS: 2 days

## 2015-04-11 NOTE — Evaluation (Signed)
Physical Therapy Evaluation Patient Details Name: Stefanie [name] MRN: [id number] DOB: 1945/09/12 Today's Date: 04/11/2015   History of Present Illness  Stefanie S Blayney is a 70 y.o. female with a Past Medical History of HTN, HLD, DM, CVA, protein calorie malnutrition, ESRD on dialysis, CHF, who presents with cellulitis, bacteremia, in need of dialysis  Clinical Impression  Pt admitted with the above complications. Pt currently with functional limitations due to the deficits listed below (see PT Problem List). Patient received incontinent of stool in the bed, which she reports is abnormal for her. She demonstrates difficulty with basic bed mobility and required assist with all tasks including transfers. She lives alone and reports being independent with an assistive device PTA. She has an aide that assists with cooking and cleaning only. She was motivated to work with therapy and will be a good candidate for ST SNF to improve her safety and independence prior to returning home. Pt will benefit from skilled PT to increase their independence and safety with mobility to allow discharge to the venue listed below.       Follow Up Recommendations SNF    Equipment Recommendations  None recommended by PT    Recommendations for Other Services       Precautions / Restrictions Precautions Precautions: Fall Restrictions Weight Bearing Restrictions: No      Mobility  Bed Mobility Overal bed mobility: Needs Assistance Bed Mobility: Supine to Sit;Rolling Rolling: Min assist   Supine to sit: Min assist     General bed mobility comments: Pt required min assist for LE and trunk to facilitate roll to Lt and Rt while performing pericare due to bowel incontinence. Min assist for truncal support to rise to EOB. Heavy posterior lean initially. Cues for technique.  Transfers Overall transfer level: Needs assistance Equipment used: Rolling walker (2 wheeled) Transfers: Sit to/from [employer] Sit to Stand: Min assist Stand pivot transfers: Min assist       General transfer comment: Min assist for boost to stand from bed and BSC. Practiced several time with loss of balance to the posterior. Pt leaning heavily over RW for support despite cues for upright posture. Difficulty maintaining. No overt buckling noted however does require frequent assist for balance and walker control during pivot to [facility] and recliner.  Ambulation/Gait                Stairs            Wheelchair Mobility    Modified Rankin (Stroke Patients Only)       Balance Overall balance assessment: Needs assistance Sitting-balance support: Feet supported;Single extremity supported Sitting balance-Leahy Scale: Poor   Postural control: Posterior lean Standing balance support: Bilateral upper extremity supported Standing balance-Leahy Scale: Poor                               Pertinent Vitals/Pain Pain Assessment: Faces Faces Pain Scale: Hurts even more Pain Location: LLE with movement Pain Descriptors / Indicators: Grimacing;Guarding Pain Intervention(s): Monitored during session;Repositioned    Home Living Family/patient expects to be discharged to:: Private residence Living Arrangements: Alone Available Help at Discharge: Personal care attendant (5 days/week 3 hours) Type of Home: Apartment Home Access: Elevator     Home Layout: One level Home Equipment: Environmental consultant - 4 wheels;Shower seat      Prior Function Level of Independence: Needs assistance   Gait / Transfers Assistance Needed: Uses Rollator for  ambulation  ADL's / Homemaking Assistance Needed: Aide assists with meals and cleaning only        Hand Dominance   Dominant Hand: Right    Extremity/Trunk Assessment   Upper Extremity Assessment: Defer to OT evaluation           Lower Extremity Assessment: Generalized weakness;LLE deficits/detail   LLE Deficits / Details: guarded due to  pain     Communication   Communication: No difficulties  Cognition Arousal/Alertness: Awake/alert Behavior During Therapy: WFL for tasks assessed/performed Overall Cognitive Status: Within Functional Limits for tasks assessed                      General Comments General comments (skin integrity, edema, etc.): Pt with bowel incontinence in bed, and had a second bowel movement once out of bed. pericare performed. RN notified. SpO2 95% on room air with 3/4 dyspnea with minimal movement in bed.    Exercises General Exercises - Lower Extremity Ankle Circles/Pumps: AROM;Both;10 reps;Seated Long Arc Quad: Strengthening;Both;10 reps;Seated Hip Flexion/Marching: Strengthening;Both;10 reps;Seated      Assessment/Plan    PT Assessment Patient needs continued PT services  PT Diagnosis Difficulty walking;Generalized weakness   PT Problem List Decreased strength;Decreased activity tolerance;Decreased balance;Decreased mobility;Decreased coordination;Decreased knowledge of use of DME;Pain  PT Treatment Interventions DME instruction;Gait training;Functional mobility training;Therapeutic activities;Balance training;Therapeutic exercise;Patient/family education   PT Goals (Current goals can be found in the Care Plan section) Acute Rehab PT Goals Patient Stated Goal: Feel better PT Goal Formulation: With patient Time For Goal Achievement: 04/25/15 Potential to Achieve Goals: Good    Frequency Min 3X/week   Barriers to discharge Decreased caregiver support lives alone    Co-evaluation               End of Session Equipment Utilized During Treatment: Gait belt Activity Tolerance: Other (comment) (Bowel incontinence) Patient left: in chair;with call bell/phone within reach;with chair alarm set;with nursing/sitter in room Nurse Communication: Mobility status         Time: [id number] PT Time Calculation (min) (ACUTE ONLY): 30 min   Charges:   PT Evaluation $PT Eval  Moderate Complexity: 1 Procedure PT Treatments $Therapeutic Activity: 8-22 mins   PT G CodesEllouise Newer 04/11/2015, 8:59 AM  Elayne Snare, [place]

## 2015-04-11 NOTE — Progress Notes (Signed)
Subjective:  Sitting in bedside chair  Leg feels better/ HD today  Objective Vital signs in last 24 hours: Filed Vitals:   04/10/15 0938 04/10/15 1700 04/10/15 2046 04/11/15 0446  BP:  142/50 128/49 141/60  Pulse:  58 58 56  Temp:  97.9 F (36.6 C) 98.4 F (36.9 C) 97.8 F (36.6 C)  TempSrc:  Oral Oral Oral  Resp:  18 18 20   Height:      Weight:    80.6 kg (177 lb 11.1 oz)  SpO2: 100% 100% 100% 100%   Weight change: 4.849 kg (10 lb 11.1 oz)  Physical Exam: General: alert AAF, NAD, appears more comfortable today Heart: RRR, No mur, rub, gallop Lungs: CTA /Nonlabored breating Abdomen: Sl obese,NT,ND, BS pos Extremities: Left pedal edema trace   Skin: no overt rash/ left lower leg( calf) dark color/ tender to Palpitation / no pedal ulcers  Dialysis Access: LFem AVGG pos bruit   OP Dialysis Orders: Center: [facility] on TTS. EDW 73.0 kg HD Bath 2k,2.ca Time 3hr 30 min Heparin 4000. Access L fem. avgg  Calcitriol 1.0 mcg Po /HD/ Mircera 75 mcg q 2wks last on 04/02/15 hd  Other op lab HGB 11 Ca 9.2 phos 3.5 pth 690    Assessment/Plan: 1. L Lower Extremity Pain / Cellulitis left calf = VVS consulted, neg [place], Dr. Trula Slade =" Does not think the source of her discomfort is vascular in nature" Has brisk Doppler signal in her posterior tibial artery. Ankle brachial index is 0.74/ pain improved today  2. Bacteremia 04/09/15 Blood culture pos for GNR final  Pending  on vaco /zosyn /?source  3. ESRD - Normal TTS ([facility]) K5.0  HD today 4. Hypertension/volume - cxr  no excess volume.bp ok / continue on Home Lisinopril/ off  Coreg 5. HO L DVT On Coumadin  6. HO Diastolic Hrt failure = compensated / EF 35% 11/16 2d  7. Anemia - hgb 11.2 > 9.5 ESA Next week hd / fu hgb trend  8. Metabolic bone disease - Po vit D on hd and binder/ sensipar    Ernest Haber, PA-C [place] [phone] 04/11/2015,8:54 AM  LOS: 2 days   Pt seen, examined and  agree w A/P as above. Max UF w HD today, up from dry wt by 7-8 kg. DC lisinopril.  Kelly Splinter MD [facility] pager [phone]    cell [phone] 04/11/2015, 3:17 PM    Labs: Basic Metabolic Panel:  Recent Labs Lab 04/09/15 0842 04/11/15 0529  NA 143 139  K 4.9 5.0  CL 98* 100*  CO2 24 25  GLUCOSE 157* 117*  BUN 88* 97*  CREATININE 9.17* 11.20*  CALCIUM 9.7 8.8*   Liver Function Tests:  Recent Labs Lab 04/09/15 0842 04/11/15 0529  AST 68* 50*  ALT 104* 97*  ALKPHOS 70 61  BILITOT 0.8 0.9  PROT 6.1* 5.4*  ALBUMIN 3.8 2.7*  CBC:  Recent Labs Lab 04/09/15 0842 04/11/15 0529  WBC 13.3* 6.1  NEUTROABS 11.9*  --   HGB 11.2* 9.5*  HCT 33.9* 29.3*  MCV 96.3 97.0  PLT 123* 84*    CBG:  Recent Labs Lab 04/10/15 0809 04/10/15 1308 04/10/15 1622 04/10/15 2148 04/11/15 0752  GLUCAP 76 115* 177* 151* 127*    Studies/Results: Ct Angio Low Extrem Left W/cm &/or Wo/cm  04/09/2015  CLINICAL DATA:  Left lower extremity pain. History of end-stage renal disease with current left thigh dialysis graft. History of left lower extremity  DVT 1 year ago. EXAM: CT ANGIOGRAPHY LOWER LEFT EXTREMITY TECHNIQUE: Multidetector CT was performed of the left lower extremity during arterial phase of contrast opacification with coronal and sagittal reformatted imaging. CONTRAST:  150mL OMNIPAQUE IOHEXOL 350 MG/ML SOLN COMPARISON:  None. FINDINGS: The visualized left common iliac artery shows calcified plaque. The common iliac origin and abdominal aorta were not imaged. No significant occlusive disease is seen involving the left external iliac or common femoral arteries. A patent loop dialysis graft is present in the thigh with arterial anastomosis to the profunda femoral artery. The graft shows mild pseudoaneurysm formation along the arterial limb. Distal graft anastomosis is well visualized with the common femoral vein and shows normal patency. Venous outflow stents extend from  just above the venous anastomosis into the distal common iliac vein and shows normal patency. The left SFA shows scattered calcifications but is patent without significant occlusive disease. There is limited contrast opacification at the level of the popliteal artery and tibial arteries, limiting accurate assessment of arterial patency on the current study. This is likely partially related to the fact that the dialysis graft is creating relative steal of arterial contrast proximally. No obvious embolic disease is identified. No significant nonvascular findings. There is no evidence to suggest infection or other inflammatory process of the extremity. Atrophy and fatty replacement of posterior calf musculature present. No abnormal fluid collections or evidence of hematoma. No evidence to suggest bony fracture, osteomyelitis or bone lesions. Review of the MIP images confirms the above findings. IMPRESSION: No significant occlusive disease identified of the arterial supply to the left lower extremity. Distal arterial opacification is quite limited due to relative steal of contrast by a left thigh dialysis graft. The graft is normally patent. No evidence to suggest infection or other acute process in the left lower extremity. No bone abnormalities identified. Electronically Signed   By: Aletta Edouard M.D.   On: 04/09/2015 17:04   Dg Chest Portable 1 View  04/09/2015  CLINICAL DATA:  Shortness of breath with extreme fatigue, weakness and possible fever. History of hypertension, diabetes, congestive heart failure and hemodialysis. EXAM: PORTABLE CHEST 1 VIEW COMPARISON:  04/01/2015 and 03/18/2015. FINDINGS: 1118 hours. There is stable cardiomegaly with chronic vascular congestion and central airway thickening. No overt pulmonary edema, confluent airspace opacity, pleural effusion or pneumothorax. Vascular stent in the left upper arm and metallic foreign body in the right supraclavicular fossa are unchanged. No acute  osseous findings are seen. IMPRESSION: Cardiomegaly with chronic vascular congestion and central airway thickening, but no overt edema or focal airspace disease. Electronically Signed   By: Richardean Sale M.D.   On: 04/09/2015 11:55   Medications:   . apixaban  5 mg Oral BID  . calcium acetate  2,001 mg Oral BID WC  . cinacalcet  90 mg Oral Q breakfast  . gabapentin  300 mg Oral QHS  . insulin aspart  0-15 Units Subcutaneous TID WC  . insulin aspart  0-5 Units Subcutaneous QHS  . insulin glargine  10 Units Subcutaneous QHS  . latanoprost  1 drop Left Eye QHS  . lisinopril  30 mg Oral QHS  . multivitamin  1 tablet Oral QHS  . piperacillin-tazobactam (ZOSYN)  IV  2.25 g Intravenous Q8H  . pravastatin  40 mg Oral q1800  . rOPINIRole  0.5 mg Oral QHS  . saccharomyces boulardii  250 mg Oral BID  . vancomycin  750 mg Intravenous Q T,Th,Sa-HD

## 2015-04-11 NOTE — Progress Notes (Signed)
ANTICOAGULATION CONSULT NOTE - Initial Consult  Pharmacy Consult : Apixaban (on pta) Indication: h/o LLE DVT (diagnosed 02/01/15)  No Known Allergies  Patient Measurements: Height: 5\' 4"  (162.6 cm) Weight: 179 lb 3.7 oz (81.3 kg) IBW/kg (Calculated) : 54.7   Vital Signs: Temp: 97.4 F (36.3 C) (01/26 0930) Temp Source: Oral (01/26 0930) BP: 121/52 mmHg (01/26 1700) Pulse Rate: 60 (01/26 1700)  Labs:  Recent Labs  04/09/15 0842 04/11/15 0529  HGB 11.2* 9.5*  HCT 33.9* 29.3*  PLT 123* 84*  APTT 26  --   LABPROT 15.5*  --   INR 1.21  --   CREATININE 9.17* 11.20*    Estimated Creatinine Clearance: 4.9 mL/min (by C-G formula based on Cr of 11.2).   Medical History: Past Medical History  Diagnosis Date  . Hypertension   . Hyperlipidemia   . Diabetes mellitus   . CVA (cerebral vascular accident) ([place]) [place]    left sided weakness  . Protein malnutrition ([place])   . Secondary hyperparathyroidism ([place])   . Anemia   . End stage renal disease ([place])     initiated HD 2006  . Hx of echocardiogram     a. Echo 03/2010: EF 55-60%, Gr 1 diast dysfn, trivial MS, mild to mod LAE, PASP 34, ;  b. Echo 11/13: mild LVH, EF 60%, Gr 1 diast dysfn, Ao sclerosis, no AS, MAC, slight MS, mean 3 mmHg, PASP 34  . Hx of cardiovascular stress test     a. MV 03/2010:  no ischemia, EF 55%  . CHF (congestive heart failure) ([place])   . Retinal detachment, bilateral   . CAD (coronary artery disease)   . Renal insufficiency     Medications:  Prescriptions prior to admission  Medication Sig Dispense Refill Last Dose  . acetaminophen (TYLENOL) 325 MG tablet Take 1-2 tablets (325-650 mg total) by mouth every 4 (four) hours as needed for mild pain.   04/08/2015 at Unknown time  . apixaban (ELIQUIS) 2.5 MG TABS tablet Take 2.5 mg by mouth 2 (two) times daily.   04/08/2015  . calcium acetate (ELIPHOS) 667 MG tablet Take 667 mg by mouth 3 (three) times daily with meals. Take 3 capsule in the morning,  2 capsules with a snack and 3 capsules in the evening.   04/08/2015 at Unknown time  . cinacalcet (SENSIPAR) 90 MG tablet Take 90 mg by mouth daily.   04/08/2015 at Unknown time  . folic acid-vitamin b complex-vitamin c-selenium-zinc (DIALYVITE) 3 MG TABS tablet Take 1 tablet by mouth daily.   04/08/2015 at Unknown time  . gabapentin (NEURONTIN) 300 MG capsule Take 300 mg by mouth at bedtime.   04/08/2015 at Unknown time  . guaiFENesin (MUCINEX) 600 MG 12 hr tablet Take 1 tablet (600 mg total) by mouth 2 (two) times daily as needed for cough.   04/08/2015 at Unknown time  . insulin glargine (LANTUS) 100 UNIT/ML injection Inject 0.1 mLs (10 Units total) into the skin at bedtime. 10 mL 11 04/08/2015 at Unknown time  . latanoprost (XALATAN) 0.005 % ophthalmic solution Place 1 drop into the left eye at bedtime.   04/08/2015 at Unknown time  . lisinopril (PRINIVIL,ZESTRIL) 30 MG tablet Take 30 mg by mouth at bedtime.   04/08/2015 at Unknown time  . nitroGLYCERIN (NITROSTAT) 0.4 MG SL tablet Place 1 tablet (0.4 mg total) under the tongue every 5 (five) minutes as needed for chest pain.  12 unk at unk  . polyethylene glycol (MIRALAX /  GLYCOLAX) packet Take 17 g by mouth daily as needed. (Patient taking differently: Take 17 g by mouth daily. ) 14 each 0 04/08/2015 at Unknown time  . pravastatin (PRAVACHOL) 40 MG tablet Take 40 mg by mouth daily.    04/08/2015 at Unknown time  . PROAIR HFA 108 (90 BASE) MCG/ACT inhaler Inhale 1 puff into the lungs every 4 (four) hours as needed for wheezing or shortness of breath.    04/08/2015 at Unknown time  . rOPINIRole (REQUIP) 0.5 MG tablet Take 0.5 mg by mouth at bedtime.  12 04/08/2015 at Unknown time  . saccharomyces boulardii (FLORASTOR) 250 MG capsule Take 1 capsule (250 mg total) by mouth 2 (two) times daily.   04/08/2015 at Unknown time  . VOLTAREN 1 % GEL APPLY 2 GRAMS TO AREA UP TO 4 TIMES A DAY  2 04/08/2015 at Unknown time  . calcitRIOL (ROCALTROL) 0.5 MCG capsule Take 3  capsules (1.5 mcg total) by mouth Every Tuesday,Thursday,and Saturday with dialysis. (Patient not taking: Reported on 04/09/2015)   Not Taking at Unknown time  . carvedilol (COREG) 3.125 MG tablet Take 1 tablet (3.125 mg total) by mouth 2 (two) times daily with a meal. (Patient not taking: Reported on 04/09/2015)   Not Taking at Unknown time  . Darbepoetin Alfa (ARANESP) 100 MCG/0.5ML SOSY injection Inject 0.5 mLs (100 mcg total) into the vein every Tuesday with hemodialysis. (Patient not taking: Reported on 04/09/2015) 4.2 mL  Not Taking at Unknown time  . ketoconazole (NIZORAL) 2 % cream Apply 1 application topically daily. (Patient not taking: Reported on 04/09/2015) 15 g 0 Not Taking at Unknown time  . lidocaine-prilocaine (EMLA) cream Apply 1 application topically as needed (topical anesthesia for hemodialysis if Gebauers and Lidocaine injection are ineffective.). (Patient not taking: Reported on 04/09/2015) 30 g 0 Not Taking at Unknown time  . Menthol-Methyl Salicylate (MUSCLE RUB) 10-15 % CREA Apply 1 application topically 2 (two) times daily as needed for muscle pain. (Patient not taking: Reported on 04/09/2015)  0 Not Taking at Unknown time  . multivitamin (RENA-VIT) TABS tablet Take 1 tablet by mouth at bedtime. (Patient not taking: Reported on 04/09/2015)  0 Not Taking at Unknown time  . polycarbophil (FIBERCON) 625 MG tablet Take 2 tablets (1,250 mg total) by mouth daily. (Patient not taking: Reported on 04/09/2015)   Not Taking at Unknown time  . warfarin (COUMADIN) 5 MG tablet Take 7.5 mg today with supper. Alternate 5 mg with 7.5 every other day. (Patient not taking: Reported on 04/09/2015)   Not Taking at Unknown time   Scheduled:  . apixaban  2.5 mg Oral BID  . calcium acetate  2,001 mg Oral BID WC  . cinacalcet  90 mg Oral Q breakfast  . gabapentin  300 mg Oral QHS  . insulin aspart  0-15 Units Subcutaneous TID WC  . insulin aspart  0-5 Units Subcutaneous QHS  . insulin glargine  10 Units  Subcutaneous QHS  . latanoprost  1 drop Left Eye QHS  . multivitamin  1 tablet Oral QHS  . piperacillin-tazobactam (ZOSYN)  IV  2.25 g Intravenous Q8H  . pravastatin  40 mg Oral q1800  . rOPINIRole  0.5 mg Oral QHS  . saccharomyces boulardii  250 mg Oral BID  . vancomycin  750 mg Intravenous Q T,Th,Sa-HD    Assessment: 70 y.o female with h/o ESRD on HD, PAD, LLE common femoral vein DVT  (diagnosed 02/01/15).  She presented to [facility] 04/09/15  With left  leg pain.  -On 04/09/15 Left  LLE venous duplex was negative for acute deep vein thrombosis involving the  visualized veins of the left lower extremity. Showed chronic thrombus areas noted in the left femoral vein.  -upon admission 04/09/15, pt reported she takes Apixaban 5mg  bid and that she was no longer taking Warfarin.   - [facility] contacted [facility], who states Rx is for Apixaban 2.5 mg 1 tablet BID, started on 03/18/2015 by a [facility] ED MD. - Review of history:  - After her diagnosis of LLE DVT on 02/01/15 at [facility], she was transferred to [facility] 02/01/15. (CT negative for PE 01/2015).  LLE DVT was treated with IV heparin and warfarin. Transferred to [facility] rehab unit, remained on warfarin and discharged to [facility] on 02/25/15 on Warfarin 7.5mg  alt w/5mg  qod.  -03/06/15 -She was seen for an episode of bleeding from lelft thigh graft after dialysis in the ER on 03/06/15. Noted no further bleeding and suture placed in ED 03/06/15.  -[facility] filled [facility] RX for Apixaban 2.5mg  1tablet BID on 03/18/15.  Discussed with patient. She says she went to [facility] ED early January because she thought she had another clot. She stated she did not have any bleeding complications with coumadin and she can not tell me why coumadin was changed to apixaban.  I discussed the above with Dr. Algis Liming and Dr. Jonnie Finner. Agree for now to continue the Apixaban 2.5mg  po BID.    Plan:  Resume Apixaban 2.5 mg po BID. Will follow up tomorrow  with Dr. Algis Liming and Nephrology.   Thank you for allowing pharmacy to be part of this patients care team. Nicole Cella, RPh Clinical Pharmacist Pager: [phone] 04/11/2015,5:29 PM

## 2015-04-12 LAB — CULTURE, BLOOD (ROUTINE X 2)

## 2015-04-12 LAB — HEPARIN LEVEL (UNFRACTIONATED)
HEPARIN UNFRACTIONATED: 1.06 [IU]/mL — AB (ref 0.30–0.70)
HEPARIN UNFRACTIONATED: 0.97 [IU]/mL — AB (ref 0.30–0.70)

## 2015-04-12 LAB — CBC
HEMATOCRIT: 28.1 % — AB (ref 36.0–46.0)
HEMOGLOBIN: 9.3 g/dL — AB (ref 12.0–15.0)
MCH: 32.4 pg (ref 26.0–34.0)
MCHC: 33.1 g/dL (ref 30.0–36.0)
MCV: 97.9 fL (ref 78.0–100.0)
Platelets: 70 10*3/uL — ABNORMAL LOW (ref 150–400)
RBC: 2.87 MIL/uL — AB (ref 3.87–5.11)
RDW: 18.5 % — ABNORMAL HIGH (ref 11.5–15.5)
WBC: 4.2 10*3/uL (ref 4.0–10.5)

## 2015-04-12 LAB — GLUCOSE, CAPILLARY
GLUCOSE-CAPILLARY: 142 mg/dL — AB (ref 65–99)
Glucose-Capillary: 116 mg/dL — ABNORMAL HIGH (ref 65–99)
Glucose-Capillary: 100 mg/dL — ABNORMAL HIGH (ref 65–99)
Glucose-Capillary: 99 mg/dL (ref 65–99)

## 2015-04-12 LAB — APTT
APTT: 29 s (ref 24–37)
APTT: 78 s — AB (ref 24–37)

## 2015-04-12 MED ORDER — WARFARIN - PHARMACIST DOSING INPATIENT
Freq: Every day | Status: DC
  Administered 2015-04-13 – 2015-04-17 (×2)

## 2015-04-12 MED ORDER — DEXTROSE 5 % IV SOLN
2.0000 g | INTRAVENOUS | Status: DC
  Filled 2015-04-12: qty 2

## 2015-04-12 MED ORDER — HEPARIN (PORCINE) IN NACL 100-0.45 UNIT/ML-% IJ SOLN
1300.0000 [IU]/h | INTRAMUSCULAR | Status: DC
  Administered 2015-04-12 – 2015-04-15 (×4): 1150 [IU]/h via INTRAVENOUS
  Administered 2015-04-16 – 2015-04-18 (×4): 1250 [IU]/h via INTRAVENOUS
  Administered 2015-04-19: 1300 [IU]/h via INTRAVENOUS
  Filled 2015-04-12 (×9): qty 250

## 2015-04-12 MED ORDER — WARFARIN SODIUM 7.5 MG PO TABS
7.5000 mg | ORAL_TABLET | Freq: Once | ORAL | Status: AC
  Administered 2015-04-12: 7.5 mg via ORAL
  Filled 2015-04-12: qty 1

## 2015-04-12 MED ORDER — DEXTROSE 5 % IV SOLN
2.0000 g | Freq: Once | INTRAVENOUS | Status: AC
  Administered 2015-04-12: 2 g via INTRAVENOUS
  Filled 2015-04-12: qty 2

## 2015-04-12 MED ORDER — HEPARIN BOLUS VIA INFUSION
2000.0000 [IU] | Freq: Once | INTRAVENOUS | Status: AC
  Administered 2015-04-12: 2000 [IU] via INTRAVENOUS
  Filled 2015-04-12: qty 2000

## 2015-04-12 MED ORDER — COUMADIN BOOK
Freq: Once | Status: AC
  Administered 2015-04-12: 18:00:00
  Filled 2015-04-12: qty 1

## 2015-04-12 MED ORDER — WARFARIN VIDEO: Freq: Once | Status: DC

## 2015-04-12 NOTE — Clinical Social Work Note (Signed)
Clinical Social Work Assessment  Patient Details  Name: Stefanie [name] MRN: [id number] Date of Birth: Sep 10, 1945  Date of referral:  04/12/15               Reason for consult:  Facility Placement                Permission sought to share information with:  Family Supports Permission granted to share information::  Yes, Verbal Permission Granted  Name::     Aliene Altes  Agency::     Relationship::  Daughter  Contact Information:  [phone]  Housing/Transportation Living arrangements for the past 2 months:  [place] of Information:  Patient Patient Interpreter Needed:  None Criminal Activity/Legal Involvement Pertinent to Current Situation/Hospitalization:  No - Comment as needed Significant Relationships:  Adult Children Lives with:  Self Do you feel safe going back to the place where you live?  Yes (Patient feels safe at home but understands and agrees that she needs rehab prior to returning home.) Need for family participation in patient care:  Yes (Comment)  Care giving concerns:  Patient lives alone and has no one to assist her post discharge.   Social Worker assessment / plan:  CSW talked with patient at the bedside regarding discharge planning and recommendation of ST rehab. Patient was alert/oriented and agreeable to talking with CSW. She was sitting in a chair eating lunch.  Patient in agreement and provided CSW with her facility preference ([facility]) as she has been there before.  Patient reported that she has one daughter Virgilio Belling) that lives in [place] and 2 who live in [place].   Employment status:  Retired Forensic scientist:  Information systems manager, Medicaid [place] PT Recommendations:  [place] / Referral to community resources:  [place] [employer] not given as patient had facility preference)  Patient/Family's Response to care:  No concerns expressed regarding care during this  hospitalization.  Patient/Family's Understanding of and Emotional Response to Diagnosis, Current Treatment, and Prognosis:  Not discussed.  Emotional Assessment Appearance:  Appears stated age Attitude/Demeanor/Rapport:  Other (Approprriate) Affect (typically observed):  Pleasant, Appropriate Orientation:  Oriented to Self, Oriented to Place, Oriented to  Time, Oriented to Situation Alcohol / Substance use:  Tobacco Use (Patient reports that she quit smoking about 32 years ago and that she does not dirnk or use illicit drugs.) Psych involvement (Current and /or in the community):     Discharge Needs  Concerns to be addressed:  Discharge Planning Concerns Readmission within the last 30 days:  No Current discharge risk:  None Barriers to Discharge:  No Barriers Identified   Sable Feil, LCSW 04/12/2015, 5:45 PM

## 2015-04-12 NOTE — Progress Notes (Addendum)
PROGRESS NOTE    Stefanie [name] [id number] DOB: [birth year] DOA: 04/09/2015 PCP: Bonnita Nasuti, MD  HPI/Brief narrative 70 year old female patient with history of ESRD on TTS HD, DM, CVA, HTN, DVT LAD, presented from dialysis Center in [place] to [facility] ED on 04/09/15 with complaints of left leg pain. Vascular surgery evaluated ED and did not feel vascular insufficiency was etiology. Admitted for cellulitis of left leg and bacteremia. Nephrology following.   Assessment/Plan:   1. Left leg cellulitis: h/o PAD status post stenting to left common iliac vein and left common femoral vein. Patient evaluated by vascular surgery on 1/24 and pain felt not likely from vascular insufficiency. Venous Doppler negative for acute DVT. Started empirically on IV vancomycin and Zosyn. Improving. Nephrology discussed with infectious disease who recommended transitioning to IV Fortaz across HD and treat for total 7-10 days. 2. Serratia bacteremia: One of 2 blood cultures positive. Initially started on vancomycin and Zosyn. Today switching to IV Fortaz across HD as per ID recommendations. 3. ESRD on TTS HD: Nephrology consulted and management per nephrology. 4. Chronic systolic CHF: Echo November 2016: LVEF 35% and grade 2 diastolic dysfunction. Compensated clinically but dry weight up by 7-8 kilos per nephrology who have discontinued lisinopril and plan to Max UF with HD today. Continue Coreg. 5. History of DVT left leg: Was on Coumadin initially when DVT first diagnosed in November 2016. At some point was seen at [facility] ED and started on Apixaban. Nephrology has weighed in and indicate that Apixaban is not well studied in ESRD patients and hence will be discontinued and patient will be started on Coumadin and IV heparin bridge. 6. Essential hypertension: Controlled. 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 Reasonable inpatient control. 8. Anemia and thrombocytopenia: Anemia possibly  due to chronic kidney disease. Thrombocytopenia? Related to bacteremia or antibiotics. Follow CBCs. 9. History of CVA with residual aphasia  DVT prophylaxis: Apixaban > change to Coumadin per pharmacy. Code Status: Full Family Communication: Discussed with daughter at bedside. Disposition Plan: DC to SNF when medically stable, possibly in 2-3 days.   Consultants:  Vascular surgery  Nephrology  Procedures:  Hemodialysis  Antimicrobials:  Zosyn 1/24 > discontinued  Vancomycin 1/24 > discontinue  IV Fortaz  Subjective: Left leg pain continues to improve.  Objective: Filed Vitals:   04/11/15 1833 04/11/15 1928 04/12/15 0514 04/12/15 1041  BP: 154/67 133/52 124/60 133/69  Pulse: 72 70 58 58  Temp: 98.2 F (36.8 C) 98 F (36.7 C) 98.3 F (36.8 C) 98 F (36.7 C)  TempSrc: Oral Oral Oral Oral  Resp: 20 18 17 18   Height:      Weight:  79.379 kg (175 lb)    SpO2: 96% 100% 100% 99%    Intake/Output Summary (Last 24 hours) at 04/12/15 1519 Last data filed at 04/12/15 1348  Gross per 24 hour  Intake    910 ml  Output   3305 ml  Net  -2395 ml   Filed Weights   04/11/15 1330 04/11/15 1756 04/11/15 1928  Weight: 81.3 kg (179 lb 3.7 oz) 78.1 kg (172 lb 2.9 oz) 79.379 kg (175 lb)    Exam:  General exam: Pleasant middle-aged female sitting up comfortably in chair this morning. Respiratory system: Clear. No increased work of breathing. Cardiovascular system: S1 & S2 heard, RRR. No JVD, murmurs, gallops, clicks or pedal edema. Tele: SB 50's-SR 60's. Gastrointestinal system: Abdomen is nondistended, soft and nontender. Normal bowel sounds heard. Central  nervous system: Alert and oriented. No focal neurological deficits. Extremities: Symmetric 5 x 5 power. Left leg slightly warm but no tenderness or fluctuance or crepitus. Skin is hyperpigmented. Palpable left femoral AVG thrill. Nonfunctional left upper arm AVF.   Data Reviewed: Basic Metabolic Panel:  Recent  Labs Lab 04/09/15 0842 04/11/15 0529  NA 143 139  K 4.9 5.0  CL 98* 100*  CO2 24 25  GLUCOSE 157* 117*  BUN 88* 97*  CREATININE 9.17* 11.20*  CALCIUM 9.7 8.8*   Liver Function Tests:  Recent Labs Lab 04/09/15 0842 04/11/15 0529  AST 68* 50*  ALT 104* 97*  ALKPHOS 70 61  BILITOT 0.8 0.9  PROT 6.1* 5.4*  ALBUMIN 3.8 2.7*   No results for input(s): LIPASE, AMYLASE in the last 168 hours. No results for input(s): AMMONIA in the last 168 hours. CBC:  Recent Labs Lab 04/09/15 0842 04/11/15 0529 04/12/15 0605  WBC 13.3* 6.1 4.2  NEUTROABS 11.9*  --   --   HGB 11.2* 9.5* 9.3*  HCT 33.9* 29.3* 28.1*  MCV 96.3 97.0 97.9  PLT 123* 84* 70*   Cardiac Enzymes: No results for input(s): CKTOTAL, CKMB, CKMBINDEX, TROPONINI in the last 168 hours. BNP (last 3 results) No results for input(s): PROBNP in the last 8760 hours. CBG:  Recent Labs Lab 04/11/15 0752 04/11/15 1149 04/11/15 1924 04/12/15 0807 04/12/15 1123  GLUCAP 127* 113* 161* 116* 100*    Recent Results (from the past 240 hour(s))  Blood culture (routine x 2)     Status: None (Preliminary result)   Collection Time: 04/09/15  2:09 PM  Result Value Ref Range Status   Specimen Description BLOOD RIGHT HAND  Final   Special Requests BOTTLES DRAWN AEROBIC ONLY 3CC  Final   Culture NO GROWTH 3 DAYS  Final   Report Status PENDING  Incomplete  Blood culture (routine x 2)     Status: None   Collection Time: 04/09/15  2:25 PM  Result Value Ref Range Status   Specimen Description BLOOD RIGHT ANTECUBITAL  Final   Special Requests IN PEDIATRIC BOTTLE 2CC  Final   Culture  Setup Time   Final    GRAM NEGATIVE RODS IN PEDIATRIC BOTTLE CRITICAL RESULT CALLED TO, READ BACK BY AND VERIFIED WITH: CWICKER,RN @0554  04/10/15 MKELLY    Culture SERRATIA MARCESCENS  Final   Report Status 04/12/2015 FINAL  Final   Organism ID, Bacteria SERRATIA MARCESCENS  Final      Susceptibility   Serratia marcescens - MIC*    CEFAZOLIN  >=64 RESISTANT Resistant     CEFEPIME <=1 SENSITIVE Sensitive     CEFTAZIDIME <=1 SENSITIVE Sensitive     CEFTRIAXONE <=1 SENSITIVE Sensitive     CIPROFLOXACIN <=0.25 SENSITIVE Sensitive     GENTAMICIN <=1 SENSITIVE Sensitive     TRIMETH/SULFA <=20 SENSITIVE Sensitive     * SERRATIA MARCESCENS  MRSA PCR Screening     Status: None   Collection Time: 04/10/15  6:22 AM  Result Value Ref Range Status   MRSA by PCR NEGATIVE NEGATIVE Final    Comment:        The GeneXpert MRSA Assay (FDA approved for NASAL specimens only), is one component of a comprehensive MRSA colonization surveillance program. It is not intended to diagnose MRSA infection nor to guide or monitor treatment for MRSA infections.          Studies: No results found.      Scheduled Meds: . calcium acetate  2,001 mg Oral BID WC  . [START ON 04/13/2015] cefTAZidime (FORTAZ)  IV  2 g Intravenous Q T,Th,Sa-HD  . cinacalcet  90 mg Oral Q breakfast  . coumadin book   Does not apply Once  . gabapentin  300 mg Oral QHS  . insulin aspart  0-15 Units Subcutaneous TID WC  . insulin aspart  0-5 Units Subcutaneous QHS  . insulin glargine  10 Units Subcutaneous QHS  . latanoprost  1 drop Left Eye QHS  . multivitamin  1 tablet Oral QHS  . pravastatin  40 mg Oral q1800  . rOPINIRole  0.5 mg Oral QHS  . saccharomyces boulardii  250 mg Oral BID  . warfarin   Does not apply Once  . Warfarin - Pharmacist Dosing Inpatient   Does not apply q1800   Continuous Infusions: . heparin 1,150 Units/hr (04/12/15 1358)    Principal Problem:   Cellulitis Active Problems:   Essential hypertension   ESRD on dialysis ([place])   PAD (peripheral artery disease) (HCC)   Leukocytosis   Chronic diastolic congestive heart failure (HCC)   History of CVA with residual deficit   Bacteremia   Lactic acidosis    Time spent: 30 minutes.    Vernell Leep, MD, FACP, FHM. [facility] Pager [phone]  If 7PM-7AM, please  contact night-coverage [email or web address] Password [email or web address] 04/12/2015, 3:19 PM    LOS: 3 days

## 2015-04-12 NOTE — Clinical Social Work Placement (Signed)
   CLINICAL SOCIAL WORK PLACEMENT  NOTE  Date:  04/12/2015  Patient Details  Name: Stefanie [name] MRN: [id number] Date of Birth: 07-Jul-1945  Clinical Social Work is seeking post-discharge placement for this patient at the [place] level of care (*CSW will initial, date and re-position this form in  chart as items are completed):  No   Patient/family provided with [facility] Work Department's list of facilities offering this level of care within the geographic area requested by the patient (or if unable, by the patient's family).  Yes   Patient/family informed of their freedom to choose among providers that offer the needed level of care, that participate in Medicare, Medicaid or managed care program needed by the patient, have an available bed and are willing to accept the patient.  Yes   Patient/family informed of [place]'s ownership interest in [facility] and [facility], as well as of the fact that they are under no obligation to receive care at these facilities.  PASRR submitted to EDS on       PASRR number received on       Existing PASRR number confirmed on 04/12/15     FL2 transmitted to all facilities in geographic area requested by pt/family on 04/12/15     FL2 transmitted to all facilities within larger geographic area on       Patient informed that his/her managed care company has contracts with or will negotiate with certain facilities, including the following:            Patient/family informed of bed offers received. (SEE COMMENT SECTION BELOW)  Patient chooses bed at       Physician recommends and patient chooses bed at      Patient to be transferred to   on  .  Patient to be transferred to facility by       Patient family notified on   of transfer.  Name of family member notified:        PHYSICIAN       Additional Comment:  04/12/15 - CSW's facility preference is [facility]. Facility contacted and they  have no available beds.  Patient informed.    _______________________________________________ Sable Feil, LCSW 04/12/2015, 5:50 PM

## 2015-04-12 NOTE — Progress Notes (Signed)
Pharmacy Antibiotic Follow-up Note  Stefanie [name] is a 70 y.o. year-old female admitted on 04/09/2015.  The patient is currently on day 4 of 10 for LLE cellultis/ GNR Bacteremia (Serratia marcescens) .  Assessment/Plan:Has resolving cellulitis complicating a swollen left leg with hx of acute DVT in Nov 2016 in the same leg. Blood cultures on 04/09/15,1 of 2 cultures grew Serratia (sensitive to Ceftazidime, cefepime and cipro).  After discussion with Dr. Jonnie Finner, vanc/zosyn will be changed to Ceftazdime based on cultures and/or susceptibilities.  The stop date is entered and will be 04/18/15. Dr. Jonnie Finner notes ID recommended treat bacteremia for 7-10 days. Will give Fortaz 2g IV  today and 2g IV after each HD x total 7 days..  Temp (24hrs), Avg:98 F (36.7 C), Min:97.3 F (36.3 C), Max:98.3 F (36.8 C)   Recent Labs Lab 04/09/15 0842 04/11/15 0529 04/12/15 0605  WBC 13.3* 6.1 4.2    Recent Labs Lab 04/09/15 0842 04/11/15 0529  CREATININE 9.17* 11.20*   Estimated Creatinine Clearance: 4.8 mL/min (by C-G formula based on Cr of 11.2).    No Known Allergies  Antimicrobials this admission: Vanc 1/24>> 1/27 (last dose given 1/26) Zosyn 1/24 >>1/27   Microbiology results: 1/24 BCx X2 1of 2 GNR Serratia marcescens (Sensitive to ceftazidime, cefepime,cipro; Resistant to Cefazolin) 1/24 MRSA PCR: negative  Thank you for allowing pharmacy to be a part of this patient's care.  Nicole Cella, RPh Clinical Pharmacist Pager: [phone] 04/12/2015 12:14 PM

## 2015-04-12 NOTE — Progress Notes (Signed)
ANTICOAGULATION CONSULT NOTE - Initial Consult  Pharmacy Consult : Switch from Apixaban to IV Heparin bridge to Coumadin  Indication: h/o LLE DVT (diagnosed 02/01/15)   No Known Allergies  Patient Measurements: Height: 5\' 4"  (162.6 cm) Weight: 175 lb (79.379 kg) IBW/kg (Calculated) : 54.7 Heparin dosing weight = 71.6 kg  Vital Signs: Temp: 98.3 F (36.8 C) (01/27 0514) Temp Source: Oral (01/27 0514) BP: 124/60 mmHg (01/27 0514) Pulse Rate: 58 (01/27 0514)  Labs:  Recent Labs  04/11/15 0529 04/12/15 0605  HGB 9.5* 9.3*  HCT 29.3* 28.1*  PLT 84* 70*  CREATININE 11.20*  --     Estimated Creatinine Clearance: 4.8 mL/min (by C-G formula based on Cr of 11.2).   Medical History: Past Medical History  Diagnosis Date  . Hypertension   . Hyperlipidemia   . Diabetes mellitus   . CVA (cerebral vascular accident) ([place]) [place]    left sided weakness  . Protein malnutrition ([place])   . Secondary hyperparathyroidism ([place])   . Anemia   . End stage renal disease ([place])     initiated HD 2006  . Hx of echocardiogram     a. Echo 03/2010: EF 55-60%, Gr 1 diast dysfn, trivial MS, mild to mod LAE, PASP 34, ;  b. Echo 11/13: mild LVH, EF 60%, Gr 1 diast dysfn, Ao sclerosis, no AS, MAC, slight MS, mean 3 mmHg, PASP 34  . Hx of cardiovascular stress test     a. MV 03/2010:  no ischemia, EF 55%  . CHF (congestive heart failure) ([place])   . Retinal detachment, bilateral   . CAD (coronary artery disease)   . Renal insufficiency     Medications:  Prescriptions prior to admission  Medication Sig Dispense Refill Last Dose  . acetaminophen (TYLENOL) 325 MG tablet Take 1-2 tablets (325-650 mg total) by mouth every 4 (four) hours as needed for mild pain.   04/08/2015 at Unknown time  . apixaban (ELIQUIS) 2.5 MG TABS tablet Take 2.5 mg by mouth 2 (two) times daily.   04/08/2015  . calcium acetate (ELIPHOS) 667 MG tablet Take 667 mg by mouth 3 (three) times daily with meals. Take 3 capsule in the  morning, 2 capsules with a snack and 3 capsules in the evening.   04/08/2015 at Unknown time  . cinacalcet (SENSIPAR) 90 MG tablet Take 90 mg by mouth daily.   04/08/2015 at Unknown time  . folic acid-vitamin b complex-vitamin c-selenium-zinc (DIALYVITE) 3 MG TABS tablet Take 1 tablet by mouth daily.   04/08/2015 at Unknown time  . gabapentin (NEURONTIN) 300 MG capsule Take 300 mg by mouth at bedtime.   04/08/2015 at Unknown time  . guaiFENesin (MUCINEX) 600 MG 12 hr tablet Take 1 tablet (600 mg total) by mouth 2 (two) times daily as needed for cough.   04/08/2015 at Unknown time  . insulin glargine (LANTUS) 100 UNIT/ML injection Inject 0.1 mLs (10 Units total) into the skin at bedtime. 10 mL 11 04/08/2015 at Unknown time  . latanoprost (XALATAN) 0.005 % ophthalmic solution Place 1 drop into the left eye at bedtime.   04/08/2015 at Unknown time  . lisinopril (PRINIVIL,ZESTRIL) 30 MG tablet Take 30 mg by mouth at bedtime.   04/08/2015 at Unknown time  . nitroGLYCERIN (NITROSTAT) 0.4 MG SL tablet Place 1 tablet (0.4 mg total) under the tongue every 5 (five) minutes as needed for chest pain.  12 unk at unk  . polyethylene glycol (MIRALAX / GLYCOLAX) packet Take 17  g by mouth daily as needed. (Patient taking differently: Take 17 g by mouth daily. ) 14 each 0 04/08/2015 at Unknown time  . pravastatin (PRAVACHOL) 40 MG tablet Take 40 mg by mouth daily.    04/08/2015 at Unknown time  . PROAIR HFA 108 (90 BASE) MCG/ACT inhaler Inhale 1 puff into the lungs every 4 (four) hours as needed for wheezing or shortness of breath.    04/08/2015 at Unknown time  . rOPINIRole (REQUIP) 0.5 MG tablet Take 0.5 mg by mouth at bedtime.  12 04/08/2015 at Unknown time  . saccharomyces boulardii (FLORASTOR) 250 MG capsule Take 1 capsule (250 mg total) by mouth 2 (two) times daily.   04/08/2015 at Unknown time  . VOLTAREN 1 % GEL APPLY 2 GRAMS TO AREA UP TO 4 TIMES A DAY  2 04/08/2015 at Unknown time  . calcitRIOL (ROCALTROL) 0.5 MCG capsule  Take 3 capsules (1.5 mcg total) by mouth Every Tuesday,Thursday,and Saturday with dialysis. (Patient not taking: Reported on 04/09/2015)   Not Taking at Unknown time  . carvedilol (COREG) 3.125 MG tablet Take 1 tablet (3.125 mg total) by mouth 2 (two) times daily with a meal. (Patient not taking: Reported on 04/09/2015)   Not Taking at Unknown time  . Darbepoetin Alfa (ARANESP) 100 MCG/0.5ML SOSY injection Inject 0.5 mLs (100 mcg total) into the vein every Tuesday with hemodialysis. (Patient not taking: Reported on 04/09/2015) 4.2 mL  Not Taking at Unknown time  . ketoconazole (NIZORAL) 2 % cream Apply 1 application topically daily. (Patient not taking: Reported on 04/09/2015) 15 g 0 Not Taking at Unknown time  . lidocaine-prilocaine (EMLA) cream Apply 1 application topically as needed (topical anesthesia for hemodialysis if Gebauers and Lidocaine injection are ineffective.). (Patient not taking: Reported on 04/09/2015) 30 g 0 Not Taking at Unknown time  . Menthol-Methyl Salicylate (MUSCLE RUB) 10-15 % CREA Apply 1 application topically 2 (two) times daily as needed for muscle pain. (Patient not taking: Reported on 04/09/2015)  0 Not Taking at Unknown time  . multivitamin (RENA-VIT) TABS tablet Take 1 tablet by mouth at bedtime. (Patient not taking: Reported on 04/09/2015)  0 Not Taking at Unknown time  . polycarbophil (FIBERCON) 625 MG tablet Take 2 tablets (1,250 mg total) by mouth daily. (Patient not taking: Reported on 04/09/2015)   Not Taking at Unknown time  . warfarin (COUMADIN) 5 MG tablet Take 7.5 mg today with supper. Alternate 5 mg with 7.5 every other day. (Patient not taking: Reported on 04/09/2015)   Not Taking at Unknown time   Scheduled:  . calcium acetate  2,001 mg Oral BID WC  . cinacalcet  90 mg Oral Q breakfast  . gabapentin  300 mg Oral QHS  . insulin aspart  0-15 Units Subcutaneous TID WC  . insulin aspart  0-5 Units Subcutaneous QHS  . insulin glargine  10 Units Subcutaneous QHS  .  latanoprost  1 drop Left Eye QHS  . multivitamin  1 tablet Oral QHS  . piperacillin-tazobactam (ZOSYN)  IV  2.25 g Intravenous Q8H  . pravastatin  40 mg Oral q1800  . rOPINIRole  0.5 mg Oral QHS  . saccharomyces boulardii  250 mg Oral BID  . vancomycin  750 mg Intravenous Q T,Th,Sa-HD    Assessment: 70 y.o female with h/o ESRD on HD, PAD, LLE common femoral vein DVT  (diagnosed 02/01/15).  She presented to [facility]/09/15  with left leg pain this admission. -On 04/09/15 Left  LLE venous duplex was  negative for acute deep vein thrombosis involving the  visualized veins of the left lower extremity. Showed chronic thrombus areas noted in the left femoral vein.  -upon admission 04/09/15, pt reported she takes Apixaban (reported 5mg  bid) and that she was no longer taking Warfarin.   - I contacted [facility] 1/[facility], who states Rx is for Apixaban 2.5 mg 1 tablet BID, started on 03/18/2015 by a [facility] ED MD.  I have discussed this with Dr. Jonnie Finner and Dr. Algis Liming.  As Apixaban (NOACs) is not approved for use in ESRD patient for VTE treament,  they are switching patient back to coumadin as she was initially on coumadin when discharged from [place] on 02/25/15. She will need IV heparin bridge to coumadin for recent acute LLE DVT (02/01/15) until INR therapeutic (2-3). Her prior coumadin dosage when discharged from [facility] 02/25/15 was 7.5mg  alternating with 5mg  every other day and  INR was 2.21, H/H 10.1/31.3, pltc 134K.   Outpatient INR: on 03/06/15 ED visit  INR= 2.31.  On this admit 04/08/14: INR =1.21, aPTT 26, H/H 11.2/33.9, PLTC 123K Today 1/27: H/H 9.3/28.1,  plct 70K Last Apixaban dose given 1/25 @ 21:18 (2.5mg )  Due to apixaban effect on heparin levels  we will need to adjust heparin infusion rate based on aPTT until effect of apixaban is gone.   Goal aPTT = 66-102 seconds  Heparin level 0.3-0.7 units/ml  Previous hospitalization  (including rehab stay), on heparin drip from  11/18-11/29/16 the heparin rate was 1150 to 1200 units/hr with therapeutic heparin level. (weight was 83kg, H/H was 8.4/25 and pltc was 155K).  ___________________________________________________ Review of history of recent LLE DVT:  -02/01/15 diagnosed with actuet LLE DVT at [facility] then transferred to [facility] 02/01/15. (CT negative for PE 01/2015 at [facility]).  - LLE DVT was treated with IV heparin and warfarin at [facility]. -02/08/15 transferred to [facility] rehab unit, remained on warfarin -02/25/15 discharged to [facility] on Warfarin 7.5mg  alt w/5mg  qod.  INR was 2.21  H/H 10.1/31.3, pltc 134K -Patient and her daughter report that pt was discharge from SNF to home around 03/10/16 and that the patient was not discharged from [facility] with a prescription for warfarin.   -03/17/15 or 03/18/15- patient went to [facility] ED because she "thought she had another clot in her left leg".  Patient reports that ED did not find a new clot and she was then started on Apixaban.  -[facility] for Apixaban 2.5mg  1tablet BID on 03/18/15- prescribed by a [facility] ED physician. Patient states she did not have any bleeding complications with coumadin. Only bleeding episode noted was on 03/07/15 at [facility] surgery office visit reports notes: pt had an episode of bleeding from left thigh graft after dialysis in the ER last evening. She has had no further bleeding. A suture was placed in the ER last evening.  End or prior to admit history related to recent LLE DVT. ___________________________________________________________  Plan:  STAT aPTT and heparin level (baseline) Give IV heparin bolus 2000 units x1, Heparin drip rate at 1150 units/hr 8 hour aPTT /HL  Coumadin 7.5 mg  Daily aPTT/heparin level, INR and CBC    Thank you for allowing pharmacy to be part of this patients care team. Nicole Cella, RPh Clinical Pharmacist Pager: [phone] 04/12/2015,10:26 AM

## 2015-04-12 NOTE — NC FL2 (Signed)
[place] LEVEL OF CARE SCREENING TOOL     IDENTIFICATION  Patient Name: Stefanie [name] Birthdate: 12/30/1945 Sex: female Admission Date (Current Location): 04/09/2015  [place] and [place] Number:  [place] [id number] [place] and Address:  [facility], [place] [address], [place], [place] [address]      Provider Number: [id number]  Attending Physician Name and Address:  Modena Jansky, MD  Relative Name and Phone Number:  Aliene Altes - daughter.  [phone]    Current Level of Care: Hospital Recommended Level of Care: [place] Prior Approval Number:    Date Approved/Denied:   PASRR Number: [id number] (Eff. 02/20/15)  Discharge Plan: SNF    Current Diagnoses: Patient Active Problem List   Diagnosis Date Noted  . Left leg pain 04/10/2015  . Bacteremia 04/10/2015  . Lactic acidosis   . Cellulitis 04/09/2015  . History of CVA with residual deficit 02/11/2015  . Debility 02/08/2015  . DVT (deep venous thrombosis) ([place])   . CHF (congestive heart failure) ([place])   . Chronic diastolic congestive heart failure ([place])   . ESRD (end stage renal disease) ([place])   . Congestive dilated cardiomyopathy ([place])   . Left leg DVT ([place]) 02/01/2015  . Leukocytosis 02/01/2015  . Elevated troponin 02/01/2015  . SOB (shortness of breath) 02/01/2015  . Type 2 diabetes, controlled, with neuropathy ([place]) 12/20/2014  . Pain due to onychomycosis of toenail 12/20/2014  . 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 Mechanical complication of other vascular device, implant, and graft 05/19/2013  . PAD (peripheral artery disease) ([place]) 02/24/2012  . Murmur 12/24/2011  . Bradycardia-intermittent sinus 07/10/2010  . DYSPNEA ON EXERTION 02/25/2010  . CHEST PAIN, EXERTIONAL 02/25/2010  . DM 02/24/2010  . PROTEIN MALNUTRITION 02/24/2010  . HLD (hyperlipidemia) 02/24/2010  . ANEMIA 02/24/2010  . Essential hypertension 02/24/2010  . Cerebral  artery occlusion with cerebral infarction ([place]) 02/24/2010  . ESRD on dialysis ([place]) 02/24/2010  . Secondary renal hyperparathyroidism ([place]) 02/24/2010    Orientation RESPIRATION BLADDER Height & Weight    Self, Time, Situation, Place  Normal Continent   175 lbs.  BEHAVIORAL SYMPTOMS/MOOD NEUROLOGICAL BOWEL NUTRITION STATUS      Continent Diet (Renal-carb modified )  AMBULATORY STATUS COMMUNICATION OF NEEDS Skin   Total Care (Patient did not ambulate during evaluatioin on 04/11/15) Verbally Normal                       Personal Care Assistance Level of Assistance  Bathing, Feeding, Dressing Bathing Assistance: Limited assistance Feeding assistance: Independent Dressing Assistance: Limited assistance     Functional Limitations Info  Sight, Hearing, Speech Sight Info: Adequate Hearing Info: Adequate Speech Info: Adequate    SPECIAL CARE FACTORS FREQUENCY  PT (By licensed PT)     PT Frequency: Evaluation 04/11/15 - A minimum of 3X per week therapy recommended              Contractures Contractures Info: Not present    Additional Factors Info  Code Status, Allergies, Insulin Sliding Scale Code Status Info: Full code Allergies Info: No known allergies   Insulin Sliding Scale Info: 4 times per day sliding scale       Current Medications (04/12/2015):  This is the current hospital active medication list Current Facility-Administered Medications  Medication Dose Route Frequency Provider Last Rate Last Dose  . acetaminophen (TYLENOL) tablet 650 mg  650 mg Oral Q6H PRN Radene Gunning, NP  Or  . acetaminophen (TYLENOL) suppository 650 mg  650 mg Rectal Q6H PRN Radene Gunning, NP      . albuterol (PROVENTIL) (2.5 MG/3ML) 0.083% nebulizer solution 2.5 mg  2.5 mg Inhalation Q4H PRN Lezlie Octave Black, NP      . calcium acetate (PHOSLO) capsule 2,001 mg  2,001 mg Oral BID WC Waldemar Dickens, MD   2,001 mg at 04/12/15 0850   And  . calcium acetate (PHOSLO) capsule 2,001 mg   2,001 mg Oral PRN Waldemar Dickens, MD      . Derrill Memo ON 04/13/2015] cefTAZidime (FORTAZ) 2 g in dextrose 5 % 50 mL IVPB  2 g Intravenous Q T,Th,Sa-HD Wendee Beavers, RPH      . cinacalcet (SENSIPAR) tablet 90 mg  90 mg Oral Q breakfast Radene Gunning, NP   90 mg at 04/12/15 0849  . coumadin book   Does not apply Once Wendee Beavers, [facility]      . gabapentin (NEURONTIN) capsule 300 mg  300 mg Oral QHS Radene Gunning, NP   300 mg at 04/11/15 2119  . guaiFENesin (MUCINEX) 12 hr tablet 600 mg  600 mg Oral BID PRN Radene Gunning, NP      . heparin ADULT infusion 100 units/mL (25000 units/250 mL)  1,150 Units/hr Intravenous Continuous Wendee Beavers, RPH 11.5 mL/hr at 04/12/15 1358 1,150 Units/hr at 04/12/15 1358  . HYDROcodone-acetaminophen (NORCO/VICODIN) 5-325 MG per tablet 1-2 tablet  1-2 tablet Oral Q4H PRN Radene Gunning, NP   2 tablet at 04/10/15 1927  . HYDROmorphone (DILAUDID) injection 0.5 mg  0.5 mg Intravenous Q3H PRN Radene Gunning, NP   0.5 mg at 04/11/15 1959  . insulin aspart (novoLOG) injection 0-15 Units  0-15 Units Subcutaneous TID WC Radene Gunning, NP   2 Units at 04/11/15 [phone]  . insulin aspart (novoLOG) injection 0-5 Units  0-5 Units Subcutaneous QHS Radene Gunning, NP   0 Units at 04/10/15 2217  . insulin glargine (LANTUS) injection 10 Units  10 Units Subcutaneous QHS Jeryl Columbia, NP   10 Units at 04/11/15 2124  . latanoprost (XALATAN) 0.005 % ophthalmic solution 1 drop  1 drop Left Eye QHS Radene Gunning, NP   1 drop at 04/10/15 2205  . multivitamin (RENA-VIT) tablet 1 tablet  1 tablet Oral QHS Radene Gunning, NP   1 tablet at 04/11/15 2120  . nitroGLYCERIN (NITROSTAT) SL tablet 0.4 mg  0.4 mg Sublingual Q5 min PRN Radene Gunning, NP      . ondansetron [facility]) tablet 4 mg  4 mg Oral Q6H PRN Radene Gunning, NP       Or  . ondansetron [facility]) injection 4 mg  4 mg Intravenous Q6H PRN Lezlie Octave Black, NP      . polyethylene glycol (MIRALAX / GLYCOLAX) packet 17 g  17 g Oral Daily PRN Radene Gunning, NP      . pravastatin (PRAVACHOL) tablet 40 mg  40 mg Oral q1800 Radene Gunning, NP   40 mg at 04/10/15 1717  . rOPINIRole (REQUIP) tablet 0.5 mg  0.5 mg Oral QHS Radene Gunning, NP   0.5 mg at 04/11/15 2119  . saccharomyces boulardii (FLORASTOR) capsule 250 mg  250 mg Oral BID Radene Gunning, NP   250 mg at 04/12/15 1044  . traZODone (DESYREL) tablet 25 mg  25 mg Oral QHS PRN Radene Gunning, NP  25 mg at 04/11/15 2118  . warfarin (COUMADIN) video   Does not apply Once Wendee Beavers, [facility]      . Warfarin - Pharmacist Dosing Inpatient   Does not apply [place], [facility]         Discharge Medications: Please see discharge summary for a list of discharge medications.  Relevant Imaging Results:  Relevant Lab Results:   Additional Information ss# [id number]  Sable Feil, LCSW

## 2015-04-12 NOTE — Care Management Important Message (Signed)
Important Message  Patient Details  Name: Stefanie [name] MRN: [id number] Date of Birth: 1945-05-03   Medicare Important Message Given:  Yes    Barb Merino [place] 04/12/2015, 4:31 PM

## 2015-04-12 NOTE — Progress Notes (Addendum)
Subjective:  L lower leg pain improving, tol hd yest.  Objective Vital signs in last 24 hours: Filed Vitals:   04/11/15 1756 04/11/15 1833 04/11/15 1928 04/12/15 0514  BP: 160/61 154/67 133/52 124/60  Pulse: 61 72 70 58  Temp: 97.3 F (36.3 C) 98.2 F (36.8 C) 98 F (36.7 C) 98.3 F (36.8 C)  TempSrc: Oral Oral Oral Oral  Resp: 21 20 18 17   Height:      Weight: 78.1 kg (172 lb 2.9 oz)  79.379 kg (175 lb)   SpO2: 96% 96% 100% 100%   Weight change: 0.7 kg (1 lb 8.7 oz) Physical Exam: General: alert , NAD,  comfortable today Heart: RRR, No mur, rub, gallop Lungs: soft L Lower base crackle /Nonlabored breating Abdomen: Sl obese,NT,ND, BS pos Extremities: Left pedal edema trace  Skin: no overt rash/ left lower leg( calf) dark color/less tender to Palpitation / no pedal ulcers  Dialysis Access: LFem AVGG pos bruit  OP Dialysis Orders: Center: [facility] on TTS. EDW 73.0 kg HD Bath 2k,2.ca Time 3hr 30 min Heparin 4000. Access L fem. avgg  Calcitriol 1.0 mcg Po /HD/ Mircera 75 mcg q 2wks last on 04/02/15 hd  Other op lab HGB 11 Ca 9.2 phos 3.5 pth 690    Problem/Plan: 1. L Lower Extremity Pain / Cellulitis left calf = pain resolving on antibiotics//VS consulted, neg [place], Dr. Trula Slade =" Does not think the source of her discomfort is vascular in nature" Has brisk Doppler signal in her posterior tibial artery. Ankle brachial index is 0.74/ pain improved today Has resolving cellulitis complicating a swollen left leg with hx of acute DVT in Nov 2016 in the same leg.  Also has thigh AV graft in that leg with multiple procedures and multiple stents in place (stenting "from common femoral vein to cava" per last VVS note) and not a candidate for any surgical revision on that leg from last VVS note.  Now here with LLE pain and swelling c/w cellulitis. Improving with abx. Growing Serratia in 1/2 blood cx's.  Discussed with ID, in this patient w complicating factors in that leg  and her comorbidities this should be considered a pathogen, recommended treatment for 7-10 days w Tressie Ellis after HD.   2. Bacteremia 04/09/15 Blood culture pos for GNR final Pending on vaco /zosyn /?source  3. ESRD - Normal TTS ([facility])  HD on schedule  4. Hypertension/volume -124/60 bp  yest 3300 uf with HD  Standing wt  77.1kg  Today/(edw was74kg)? Will need Incr edw as op cxr no excess volume./ now off  on Home Lisinopril and  Coreg  5. HO L DVT =home med list has Couamdin and Eliquis / now on Eliquis   / DVT  Was Nov 2016 will need coumadin  As OP for ESRD pt. (Dr. Acie Fredrickson Card in [place] ?his coumadin clinic will manage INR as OP . Reviewed report from [place] ED in Nov 2016 at which time she did have an acute DVT in the LLE.  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   6. HO Diastolic Hrt failure = compensated / EF 35% 11/16 2d  7. Anemia - hgb 11.2 > 9.5 ESA Next week hd / fu hgb trend  8. Metabolic bone disease - Po vit D on hd and binder/  Ernest Haber, PA-C [place] [phone] 04/12/2015,9:24 AM  LOS: 3 days   Pt seen, examined, agree w assess/plan as above  with additions as indicated. Cellulitis improving, needs to go back on coumadin for DVT Nov' 16.  Apixaban not well studied in ESRD pts especially for DVT.   Kelly Splinter MD [facility] pager [phone]    cell [phone] 04/12/2015, 10:37 AM     Labs: Basic Metabolic Panel:  Recent Labs Lab 04/09/15 0842 04/11/15 0529  NA 143 139  K 4.9 5.0  CL 98* 100*  CO2 24 25  GLUCOSE 157* 117*  BUN 88* 97*  CREATININE 9.17* 11.20*  CALCIUM 9.7 8.8*   Liver Function Tests:  Recent Labs Lab 04/09/15 0842 04/11/15 0529  AST 68* 50*  ALT 104* 97*  ALKPHOS 70 61  BILITOT 0.8 0.9  PROT 6.1* 5.4*  ALBUMIN 3.8 2.7*   No results for input(s): LIPASE, AMYLASE in the last 168 hours. No results for input(s): AMMONIA in the last 168  hours. CBC:  Recent Labs Lab 04/09/15 0842 04/11/15 0529 04/12/15 0605  WBC 13.3* 6.1 4.2  NEUTROABS 11.9*  --   --   HGB 11.2* 9.5* 9.3*  HCT 33.9* 29.3* 28.1*  MCV 96.3 97.0 97.9  PLT 123* 84* 70*   Cardiac Enzymes: No results for input(s): CKTOTAL, CKMB, CKMBINDEX, TROPONINI in the last 168 hours. CBG:  Recent Labs Lab 04/10/15 2148 04/11/15 0752 04/11/15 1149 04/11/15 1924 04/12/15 0807  GLUCAP 151* 127* 113* 161* 116*    Studies/Results: No results found. Medications:   . apixaban  2.5 mg Oral BID  . calcium acetate  2,001 mg Oral BID WC  . cinacalcet  90 mg Oral Q breakfast  . gabapentin  300 mg Oral QHS  . insulin aspart  0-15 Units Subcutaneous TID WC  . insulin aspart  0-5 Units Subcutaneous QHS  . insulin glargine  10 Units Subcutaneous QHS  . latanoprost  1 drop Left Eye QHS  . multivitamin  1 tablet Oral QHS  . piperacillin-tazobactam (ZOSYN)  IV  2.25 g Intravenous Q8H  . pravastatin  40 mg Oral q1800  . rOPINIRole  0.5 mg Oral QHS  . saccharomyces boulardii  250 mg Oral BID  . vancomycin  750 mg Intravenous Q T,Th,Sa-HD

## 2015-04-12 NOTE — Progress Notes (Signed)
ANTICOAGULATION CONSULT NOTE - Follow Up Consult  Pharmacy Consult for heparin/warfarin Indication: DVT  No Known Allergies  Patient Measurements: Height: 5\' 4"  (162.6 cm) Weight: 175 lb (79.379 kg) IBW/kg (Calculated) : 54.7 Heparin Dosing Weight: 70.6 KG  Vital Signs: Temp: 98.2 F (36.8 C) (01/27 1720) Temp Source: Oral (01/27 1720) BP: 147/54 mmHg (01/27 1720) Pulse Rate: 61 (01/27 1720)  Labs:  Recent Labs  04/11/15 0529 04/12/15 0605 04/12/15 1030 04/12/15 1145 04/12/15 2008  HGB 9.5* 9.3*  --   --   --   HCT 29.3* 28.1*  --   --   --   PLT 84* 70*  --   --   --   APTT  --   --  29  --  78*  HEPARINUNFRC  --   --   --  1.06* 0.97*  CREATININE 11.20*  --   --   --   --     Estimated Creatinine Clearance: 4.8 mL/min (by C-G formula based on Cr of 11.2).  Assessment: Admit Complaint: left leg pain  Patient on warfarin previously for recent LLE DVT then switch to apixaban. Patient is ESRD patient which apixaban has not been studied in. As a result, patient was converted to IV heparin as a bridge to warfarin.  Patient has had recent apixaban so will use aPTT to monitor heparin levels until the aPTT and HL are correlated.  Patient currently on 1150 units/hr heparin with aPTT of 78 which is within range. The HL is 0.97, which does not correlate.  Goal of Therapy:  INR 2-3 Heparin level 0.3-0.7 units/ml aPTT 66 - 102s seconds Monitor platelets by anticoagulation protocol: Yes   Plan:  Continue heparin at 1150 units/hr Next levels with am labs Daily HL/aPTT, CBC Monitor for s/sx of bleeding F/U INR for d/c of heparin when therapeutic  Levester Fresh, PharmD, BCPS, [facility] Clinical Pharmacist Pager [phone] 04/12/2015 9:03 PM

## 2015-04-13 LAB — CBC
HEMATOCRIT: 30.6 % — AB (ref 36.0–46.0)
HEMOGLOBIN: 10.1 g/dL — AB (ref 12.0–15.0)
MCH: 32.2 pg (ref 26.0–34.0)
MCHC: 33 g/dL (ref 30.0–36.0)
MCV: 97.5 fL (ref 78.0–100.0)
PLATELETS: 75 10*3/uL — AB (ref 150–400)
RBC: 3.14 MIL/uL — ABNORMAL LOW (ref 3.87–5.11)
RDW: 18.1 % — AB (ref 11.5–15.5)
WBC: 4.6 10*3/uL (ref 4.0–10.5)

## 2015-04-13 LAB — GLUCOSE, CAPILLARY
GLUCOSE-CAPILLARY: 95 mg/dL (ref 65–99)
GLUCOSE-CAPILLARY: 85 mg/dL (ref 65–99)
GLUCOSE-CAPILLARY: 197 mg/dL — AB (ref 65–99)
Glucose-Capillary: 118 mg/dL — ABNORMAL HIGH (ref 65–99)

## 2015-04-13 LAB — HEPATITIS B SURFACE ANTIGEN: Hepatitis B Surface Ag: NEGATIVE

## 2015-04-13 LAB — PROTIME-INR
INR: 1.17 (ref 0.00–1.49)
Prothrombin Time: 15.1 seconds (ref 11.6–15.2)

## 2015-04-13 LAB — HEPARIN LEVEL (UNFRACTIONATED)
Heparin Unfractionated: 0.69 IU/mL (ref 0.30–0.70)
Heparin Unfractionated: 0.54 IU/mL (ref 0.30–0.70)

## 2015-04-13 LAB — APTT: APTT: 72 s — AB (ref 24–37)

## 2015-04-13 MED ORDER — WARFARIN SODIUM 7.5 MG PO TABS
7.5000 mg | ORAL_TABLET | Freq: Once | ORAL | Status: AC
  Administered 2015-04-13: 7.5 mg via ORAL
  Filled 2015-04-13 (×2): qty 1

## 2015-04-13 NOTE — Progress Notes (Signed)
Subjective:  For hd today / very Minimal  L leg discomfort now   Objective Vital signs in last 24 hours: Filed Vitals:   04/12/15 1720 04/12/15 2144 04/13/15 0500 04/13/15 0547  BP: 147/54 158/64  139/55  Pulse: 61 68  63  Temp: 98.2 F (36.8 C) 98.7 F (37.1 C)  98.3 F (36.8 C)  TempSrc: Oral Oral  Oral  Resp: 20 18  16   Height:      Weight:   78.926 kg (174 lb)   SpO2: 100% 100%  100%   Weight change: -2.374 kg (-5 lb 3.7 oz)  Physical Exam: General: alert , calm , comfortable today Heart: RRR, No mur, rub, gallop Lungs:bilat coarse wheezes /Nonlabored breating Abdomen: Sl obese,NT,ND, BS pos Extremities: Left pedal edema trace  Skin: no overt rash/ left lower leg( calf) dark color/lNON tender to Palpitation NOW  Dialysis Access: LFem AVGG pos bruit  CXR 1/24 central vasc congestion  OP Dialysis Orders: Center: [facility] on TTS. EDW 73.0 kg HD Bath 2k,2.ca Time 3hr 30 min Heparin 4000. Access L fem. avgg  Calcitriol 1.0 mcg Po /HD/ Mircera 75 mcg q 2wks last on 04/02/15 hd  Other op lab HGB 11 Ca 9.2 phos 3.5 pth 690    Problem/Plan: 1. LLE cellulitis/ serratia bacteremia - for 10 days IV abx / fortaz w HD 2. ESRD - Normal TTS ([facility])  HD on schedule  3. HTN - BP's good, holding home lisinop/ coreg 4. LLE DVT 11/16 - got put on Eliquis for this which is not indicated in ESRD so switching back to coumadin. Dr. Acie Fredrickson Card in [place] coumadin clinic will manage INR as OP 5. Volume - up 5kg by wt, vasc cong by CXR 1/24 6. Anemia - hgb 11.2 > 9.5 ESA Next week hd / fu hgb trend 7.  HO COPD  Prior prn neb.  8. Disposition = NHP   9.   Ernest Haber, PA-C [place] [phone] 04/13/2015,8:12 AM  LOS: 4 days   Pt seen, examined and agree w A/P as above. HD max UF today, get down to dry wt. Stable for dc from renal standpoint. SNF pending.  Kelly Splinter MD [facility] pager [phone]    cell  [phone] 04/13/2015, 11:50 AM    Labs: Basic Metabolic Panel:  Recent Labs Lab 04/09/15 0842 04/11/15 0529  NA 143 139  K 4.9 5.0  CL 98* 100*  CO2 24 25  GLUCOSE 157* 117*  BUN 88* 97*  CREATININE 9.17* 11.20*  CALCIUM 9.7 8.8*   Liver Function Tests:  Recent Labs Lab 04/09/15 0842 04/11/15 0529  AST 68* 50*  ALT 104* 97*  ALKPHOS 70 61  BILITOT 0.8 0.9  PROT 6.1* 5.4*  ALBUMIN 3.8 2.7*   No results for input(s): LIPASE, AMYLASE in the last 168 hours. No results for input(s): AMMONIA in the last 168 hours. CBC:  Recent Labs Lab 04/09/15 0842 04/11/15 0529 04/12/15 0605 04/13/15 0428  WBC 13.3* 6.1 4.2 4.6  NEUTROABS 11.9*  --   --   --   HGB 11.2* 9.5* 9.3* 10.1*  HCT 33.9* 29.3* 28.1* 30.6*  MCV 96.3 97.0 97.9 97.5  PLT 123* 84* 70* PENDING   Cardiac Enzymes: No results for input(s): CKTOTAL, CKMB, CKMBINDEX, TROPONINI in the last 168 hours. CBG:  Recent Labs Lab 04/11/15 1924 04/12/15 0807 04/12/15 1123 04/12/15 1635 04/12/15 2143  GLUCAP 161* 116* 100* 99 142*    Studies/Results: No results found.  Medications: . heparin 1,150 Units/hr (04/13/15 0548)   . calcium acetate  2,001 mg Oral BID WC  . cefTAZidime (FORTAZ)  IV  2 g Intravenous Q T,Th,Sa-HD  . cinacalcet  90 mg Oral Q breakfast  . gabapentin  300 mg Oral QHS  . insulin aspart  0-15 Units Subcutaneous TID WC  . insulin aspart  0-5 Units Subcutaneous QHS  . insulin glargine  10 Units Subcutaneous QHS  . latanoprost  1 drop Left Eye QHS  . multivitamin  1 tablet Oral QHS  . pravastatin  40 mg Oral q1800  . rOPINIRole  0.5 mg Oral QHS  . saccharomyces boulardii  250 mg Oral BID  . warfarin   Does not apply Once  . Warfarin - Pharmacist Dosing Inpatient   Does not apply [phone]

## 2015-04-13 NOTE — Progress Notes (Signed)
PROGRESS NOTE    Stefanie [name] [id number] DOB: [birth year] DOA: 04/09/2015 PCP: Bonnita Nasuti, MD  HPI/Brief narrative 70 year old female patient with history of ESRD on TTS HD, DM, CVA, HTN, DVT LAD, presented from dialysis Center in [place] to [facility] ED on 04/09/15 with complaints of left leg pain. Vascular surgery evaluated ED and did not feel vascular insufficiency was etiology. Admitted for cellulitis of left leg and bacteremia. Nephrology following.   Assessment/Plan:   1. Left leg cellulitis: h/o PAD status post stenting to left common iliac vein and left common femoral vein. Patient evaluated by vascular surgery on 1/24 and pain felt not likely from vascular insufficiency. Venous Doppler negative for acute DVT. Started empirically on IV vancomycin and Zosyn. Improving. Nephrology discussed with infectious disease who recommended transitioning to IV Fortaz across HD and treat for total 7-10 days. 2. Serratia bacteremia: One of 2 blood cultures positive. Initially started on vancomycin and Zosyn. Switched to IV [place] across HD as per ID recommendations. 3. ESRD on TTS HD: Nephrology consulted and management per nephrology. 4. Chronic systolic CHF: Echo November 2016: LVEF 35% and grade 2 diastolic dysfunction. Compensated clinically but dry weight up by 7-8 kilos per nephrology who have discontinued lisinopril and plan to Max UF with HD today. Continue Coreg. 5. History of DVT left leg: Was on Coumadin initially when DVT first diagnosed in November 2016. At some point was seen at [facility] ED and started on Apixaban. Nephrology has weighed in and indicate that Apixaban is not well studied in ESRD patients and hence was discontinued and patient was started on Coumadin and IV heparin bridge. 6. Essential hypertension: Controlled. 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 Reasonable inpatient control. 8. Anemia and thrombocytopenia: Anemia possibly due to chronic  kidney disease. Thrombocytopenia? Related to bacteremia or antibiotics. Stable. Follow CBCs. 9. History of CVA with residual aphasia  DVT prophylaxis: Apixaban > change to Coumadin per pharmacy. Code Status: Full Family Communication: Discussed with daughter at bedside. Disposition Plan: DC to SNF when medically stable, possibly in 2-3 days, pending therapeutic INR   Consultants:  Vascular surgery  Nephrology  Procedures:  Hemodialysis  Antimicrobials:  Zosyn 1/24 > discontinued  Vancomycin 1/24 > discontinue  IV Fortaz  Subjective: Left leg pain continues to improve.  Objective: Filed Vitals:   04/13/15 1430 04/13/15 1500 04/13/15 1530 04/13/15 1600  BP: 141/57 151/68 177/77   Pulse: 61 64 67 68  Temp:      TempSrc:      Resp: 19 22 16 18   Height:      Weight:      SpO2:       temperature 70F, oxygen saturations 100%.  Intake/Output Summary (Last 24 hours) at 04/13/15 1627 Last data filed at 04/13/15 1211  Gross per 24 hour  Intake    240 ml  Output      1 ml  Net    239 ml   Filed Weights   04/11/15 1928 04/13/15 0500 04/13/15 1209  Weight: 79.379 kg (175 lb) 78.926 kg (174 lb) 77.7 kg (171 lb 4.8 oz)    Exam:  General exam: Pleasant middle-aged female sitting up comfortably in chair this morning. Respiratory system: Clear. No increased work of breathing. Cardiovascular system: S1 & S2 heard, RRR. No JVD, murmurs, gallops, clicks or pedal edema. Tele: SB SR. Gastrointestinal system: Abdomen is nondistended, soft and nontender. Normal bowel sounds heard. Central nervous system: Alert and oriented. No focal  neurological deficits. Extremities: Symmetric 5 x 5 power. Left leg slightly warm but no tenderness or fluctuance or crepitus-continues to improve. Skin is hyperpigmented. Palpable left femoral AVG thrill. Nonfunctional left upper arm AVF.   Data Reviewed: Basic Metabolic Panel:  Recent Labs Lab 04/09/15 0842 04/11/15 0529  NA 143 139  K  4.9 5.0  CL 98* 100*  CO2 24 25  GLUCOSE 157* 117*  BUN 88* 97*  CREATININE 9.17* 11.20*  CALCIUM 9.7 8.8*   Liver Function Tests:  Recent Labs Lab 04/09/15 0842 04/11/15 0529  AST 68* 50*  ALT 104* 97*  ALKPHOS 70 61  BILITOT 0.8 0.9  PROT 6.1* 5.4*  ALBUMIN 3.8 2.7*   No results for input(s): LIPASE, AMYLASE in the last 168 hours. No results for input(s): AMMONIA in the last 168 hours. CBC:  Recent Labs Lab 04/09/15 0842 04/11/15 0529 04/12/15 0605 04/13/15 0428  WBC 13.3* 6.1 4.2 4.6  NEUTROABS 11.9*  --   --   --   HGB 11.2* 9.5* 9.3* 10.1*  HCT 33.9* 29.3* 28.1* 30.6*  MCV 96.3 97.0 97.9 97.5  PLT 123* 84* 70* 75*   Cardiac Enzymes: No results for input(s): CKTOTAL, CKMB, CKMBINDEX, TROPONINI in the last 168 hours. BNP (last 3 results) No results for input(s): PROBNP in the last 8760 hours. CBG:  Recent Labs Lab 04/12/15 1123 04/12/15 1635 04/12/15 2143 04/13/15 0806 04/13/15 1112  GLUCAP 100* 99 142* 95 118*    Recent Results (from the past 240 hour(s))  Blood culture (routine x 2)     Status: None (Preliminary result)   Collection Time: 04/09/15  2:09 PM  Result Value Ref Range Status   Specimen Description BLOOD RIGHT HAND  Final   Special Requests BOTTLES DRAWN AEROBIC ONLY 3CC  Final   Culture NO GROWTH 4 DAYS  Final   Report Status PENDING  Incomplete  Blood culture (routine x 2)     Status: None   Collection Time: 04/09/15  2:25 PM  Result Value Ref Range Status   Specimen Description BLOOD RIGHT ANTECUBITAL  Final   Special Requests IN PEDIATRIC BOTTLE 2CC  Final   Culture  Setup Time   Final    GRAM NEGATIVE RODS IN PEDIATRIC BOTTLE CRITICAL RESULT CALLED TO, READ BACK BY AND VERIFIED WITH: CWICKER,RN @0554  04/10/15 MKELLY    Culture SERRATIA MARCESCENS  Final   Report Status 04/12/2015 FINAL  Final   Organism ID, Bacteria SERRATIA MARCESCENS  Final      Susceptibility   Serratia marcescens - MIC*    CEFAZOLIN >=64 RESISTANT  Resistant     CEFEPIME <=1 SENSITIVE Sensitive     CEFTAZIDIME <=1 SENSITIVE Sensitive     CEFTRIAXONE <=1 SENSITIVE Sensitive     CIPROFLOXACIN <=0.25 SENSITIVE Sensitive     GENTAMICIN <=1 SENSITIVE Sensitive     TRIMETH/SULFA <=20 SENSITIVE Sensitive     * SERRATIA MARCESCENS  MRSA PCR Screening     Status: None   Collection Time: 04/10/15  6:22 AM  Result Value Ref Range Status   MRSA by PCR NEGATIVE NEGATIVE Final    Comment:        The GeneXpert MRSA Assay (FDA approved for NASAL specimens only), is one component of a comprehensive MRSA colonization surveillance program. It is not intended to diagnose MRSA infection nor to guide or monitor treatment for MRSA infections.          Studies: No results found.      Scheduled  Meds: . calcium acetate  2,001 mg Oral BID WC  . cefTAZidime (FORTAZ)  IV  2 g Intravenous Q T,Th,Sa-HD  . cinacalcet  90 mg Oral Q breakfast  . gabapentin  300 mg Oral QHS  . insulin aspart  0-15 Units Subcutaneous TID WC  . insulin aspart  0-5 Units Subcutaneous QHS  . insulin glargine  10 Units Subcutaneous QHS  . latanoprost  1 drop Left Eye QHS  . multivitamin  1 tablet Oral QHS  . pravastatin  40 mg Oral q1800  . rOPINIRole  0.5 mg Oral QHS  . saccharomyces boulardii  250 mg Oral BID  . warfarin  7.5 mg Oral ONCE-1800  . warfarin   Does not apply Once  . Warfarin - Pharmacist Dosing Inpatient   Does not apply q1800   Continuous Infusions: . heparin 1,150 Units/hr (04/13/15 0548)    Principal Problem:   Cellulitis Active Problems:   Essential hypertension   ESRD on dialysis ([place])   PAD (peripheral artery disease) (HCC)   Leukocytosis   Chronic diastolic congestive heart failure (HCC)   History of CVA with residual deficit   Bacteremia   Lactic acidosis    Time spent: 30 minutes.    Vernell Leep, MD, FACP, FHM. [facility] Pager [phone]  If 7PM-7AM, please contact  night-coverage [email or web address] Password [email or web address] 04/13/2015, 4:27 PM    LOS: 4 days

## 2015-04-13 NOTE — Progress Notes (Signed)
ANTICOAGULATION CONSULT NOTE - Follow Up Consult  Pharmacy Consult for heparin/warfarin Indication: DVT  No Known Allergies  Patient Measurements: Height: 5\' 4"  (162.6 cm) Weight: 171 lb 4.8 oz (77.7 kg) (Standing scale) IBW/kg (Calculated) : 54.7 Heparin Dosing Weight: 70.6 KG  Labs:  Recent Labs  04/11/15 0529 04/12/15 0605 04/12/15 1030  04/12/15 2008 04/13/15 0428 04/13/15 1200 04/13/15 1242  HGB 9.5* 9.3*  --   --   --  10.1*  --   --   HCT 29.3* 28.1*  --   --   --  30.6*  --   --   PLT 84* 70*  --   --   --  75*  --   --   APTT  --   --  29  --  78* 72*  --   --   LABPROT  --   --   --   --   --   --  15.1  --   INR  --   --   --   --   --   --  1.17  --   HEPARINUNFRC  --   --   --   < > 0.97* 0.69  --  0.54  CREATININE 11.20*  --   --   --   --   --   --   --   < > = values in this interval not displayed.  Estimated Creatinine Clearance: 4.8 mL/min (by C-G formula based on Cr of 11.2).  Assessment: Admit Complaint: left leg pain  Patient on warfarin previously for recent LLE DVT then switch to apixaban. Patient is ESRD patient which apixaban has not been studied in. As a result, patient was converted to IV heparin as a bridge to warfarin.  Patient currently on 1150 units/hr heparin with aPTT in range x2, and HL in range x2. Will now use HL only to monitor therapeutic range of heparin.  Goal of Therapy:  HL 0.3-0.7 Monitor platelets by anticoagulation protocol: Yes   Plan:  Continue heparin at 1150 units/hr Coumadin 7.5 mg PO x1 Next levels with am labs Daily HL, INR, CBC Monitor for s/sx of bleeding   Joya San, PharmD Clinical Pharmacy Resident Pager # [phone] 04/13/2015 1:26 PM

## 2015-04-14 LAB — CULTURE, BLOOD (ROUTINE X 2): CULTURE: NO GROWTH

## 2015-04-14 LAB — CBC
HCT: 31.6 % — ABNORMAL LOW (ref 36.0–46.0)
HEMOGLOBIN: 10.1 g/dL — AB (ref 12.0–15.0)
MCH: 31.3 pg (ref 26.0–34.0)
MCHC: 32 g/dL (ref 30.0–36.0)
MCV: 97.8 fL (ref 78.0–100.0)
PLATELETS: 101 10*3/uL — AB (ref 150–400)
RBC: 3.23 MIL/uL — AB (ref 3.87–5.11)
RDW: 17.8 % — ABNORMAL HIGH (ref 11.5–15.5)
WBC: 4.3 10*3/uL (ref 4.0–10.5)

## 2015-04-14 LAB — GLUCOSE, CAPILLARY
GLUCOSE-CAPILLARY: 83 mg/dL (ref 65–99)
Glucose-Capillary: 131 mg/dL — ABNORMAL HIGH (ref 65–99)
Glucose-Capillary: 196 mg/dL — ABNORMAL HIGH (ref 65–99)
Glucose-Capillary: 191 mg/dL — ABNORMAL HIGH (ref 65–99)

## 2015-04-14 LAB — PROTIME-INR
INR: 1.15 (ref 0.00–1.49)
PROTHROMBIN TIME: 14.9 s (ref 11.6–15.2)

## 2015-04-14 LAB — HEPARIN LEVEL (UNFRACTIONATED): Heparin Unfractionated: 0.42 IU/mL (ref 0.30–0.70)

## 2015-04-14 LAB — APTT: aPTT: 86 seconds — ABNORMAL HIGH (ref 24–37)

## 2015-04-14 MED ORDER — LISINOPRIL 20 MG PO TABS
30.0000 mg | ORAL_TABLET | Freq: Every day | ORAL | Status: DC
  Administered 2015-04-14 – 2015-04-18 (×5): 30 mg via ORAL
  Filled 2015-04-14 (×5): qty 1

## 2015-04-14 MED ORDER — DEXTROSE 5 % IV SOLN
2.0000 g | Freq: Once | INTRAVENOUS | Status: AC
  Administered 2015-04-14: 2 g via INTRAVENOUS
  Filled 2015-04-14 (×2): qty 2

## 2015-04-14 MED ORDER — WARFARIN SODIUM 10 MG PO TABS
10.0000 mg | ORAL_TABLET | Freq: Once | ORAL | Status: AC
Start: 2015-04-14 — End: 2015-04-14
  Administered 2015-04-14: 10 mg via ORAL
  Filled 2015-04-14: qty 1

## 2015-04-14 MED ORDER — CARVEDILOL 3.125 MG PO TABS
3.1250 mg | ORAL_TABLET | Freq: Two times a day (BID) | ORAL | Status: DC
  Administered 2015-04-14 – 2015-04-19 (×9): 3.125 mg via ORAL
  Filled 2015-04-14 (×10): qty 1

## 2015-04-14 MED ORDER — GABAPENTIN 100 MG PO CAPS
100.0000 mg | ORAL_CAPSULE | Freq: Every day | ORAL | Status: DC
  Administered 2015-04-14 – 2015-04-18 (×5): 100 mg via ORAL
  Filled 2015-04-14 (×5): qty 1

## 2015-04-14 MED ORDER — DEXTROSE 5 % IV SOLN
2.0000 g | INTRAVENOUS | Status: AC
  Administered 2015-04-16 – 2015-04-18 (×2): 2 g via INTRAVENOUS
  Filled 2015-04-14 (×3): qty 2

## 2015-04-14 NOTE — Progress Notes (Signed)
PROGRESS NOTE    Stefanie [name] [id number] DOB: Feb 22, 1946 DOA: 04/09/2015 PCP: Bonnita Nasuti, MD  HPI/Brief narrative 70 year old female patient with history of ESRD on TTS HD, DM, CVA, HTN, DVT LAD, presented from dialysis Center in [place] to [facility] ED on 04/09/15 with complaints of left leg pain. Vascular surgery evaluated ED and did not feel vascular insufficiency was etiology. Admitted for cellulitis of left leg and bacteremia. Nephrology following.   Assessment/Plan:   1. Left leg cellulitis: h/o PAD status post stenting to left common iliac vein and left common femoral vein. Patient evaluated by vascular surgery on 1/24 and pain felt not likely from vascular insufficiency. Venous Doppler negative for acute DVT. Started empirically on IV vancomycin and Zosyn. Improving. Nephrology discussed with infectious disease who recommended transitioning to IV Fortaz across HD and treat for total 7-10 days. 2. Serratia bacteremia: One of 2 blood cultures positive. Initially started on vancomycin and Zosyn. Switched to IV [place] across HD as per ID recommendations. 3. ESRD on TTS HD: Nephrology consulted and management per nephrology. 4. Chronic systolic CHF: Echo November 2016: LVEF 35% and grade 2 diastolic dysfunction. Compensated clinically but dry weight up by 7-8 kilos per nephrology who have discontinued lisinopril and plan to Max UF with HD. Continue Coreg. 5. History of DVT left leg: Was on Coumadin initially when DVT first diagnosed in November 2016. At some point was seen at [facility] ED and started on Apixaban. Nephrology has weighed in and indicate that Apixaban is not well studied in ESRD patients and hence was discontinued and patient was started on Coumadin and IV heparin bridge. INR on 1/29:1.15. 6. Essential hypertension: Controlled. 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 Reasonable inpatient control. 8. Anemia and thrombocytopenia: Anemia possibly due  to chronic kidney disease. Thrombocytopenia? Related to bacteremia or antibiotics. Hemoglobin stable. Platelet count improving 70 > 75 > 101. Follow CBCs. 9. History of CVA with residual aphasia  DVT prophylaxis: Apixaban > change to Coumadin & IV heparin bridge, per pharmacy. Code Status: Full Family Communication: Discussed with daughter at bedside. Disposition Plan: DC to SNF when medically stable, possibly in 2-3 days, pending therapeutic INR   Consultants:  Vascular surgery  Nephrology  Procedures:  Hemodialysis  Antimicrobials:  Zosyn 1/24 > discontinued  Vancomycin 1/24 > discontinue  IV Fortaz  Subjective: Denies complaints. Requesting that her medications for restless leg be resumed.  Objective: Filed Vitals:   04/13/15 1744 04/13/15 2042 04/14/15 0550 04/14/15 0933  BP: 152/74 144/62 139/58 129/50  Pulse: 67 70 62 64  Temp: 98 F (36.7 C) 98.4 F (36.9 C) 98.4 F (36.9 C) 97.3 F (36.3 C)  TempSrc: Oral Oral Oral Oral  Resp: 18 16 16 17   Height:      Weight:      SpO2: 100% 100% 100% 100%    Intake/Output Summary (Last 24 hours) at 04/14/15 1247 Last data filed at 04/14/15 0840  Gross per 24 hour  Intake 700.38 ml  Output   4001 ml  Net -3300.62 ml   Filed Weights   04/13/15 0500 04/13/15 1209 04/13/15 1630  Weight: 78.926 kg (174 lb) 77.7 kg (171 lb 4.8 oz) 73.5 kg (162 lb 0.6 oz)    Exam:  General exam: Pleasant middle-aged female sitting up comfortably in chair this morning. Respiratory system: Clear. No increased work of breathing. Cardiovascular system: S1 & S2 heard, RRR. No JVD, murmurs, gallops, clicks or pedal edema. Gastrointestinal system: Abdomen  is nondistended, soft and nontender. Normal bowel sounds heard. Central nervous system: Alert and oriented. No focal neurological deficits. Extremities: Symmetric 5 x 5 power. Left leg slightly warm but no tenderness or fluctuance or crepitus-continues to improve/probably back to  baseline. Skin is hyperpigmented. Palpable left femoral AVG thrill. Nonfunctional left upper arm AVF.   Data Reviewed: Basic Metabolic Panel:  Recent Labs Lab 04/09/15 0842 04/11/15 0529  NA 143 139  K 4.9 5.0  CL 98* 100*  CO2 24 25  GLUCOSE 157* 117*  BUN 88* 97*  CREATININE 9.17* 11.20*  CALCIUM 9.7 8.8*   Liver Function Tests:  Recent Labs Lab 04/09/15 0842 04/11/15 0529  AST 68* 50*  ALT 104* 97*  ALKPHOS 70 61  BILITOT 0.8 0.9  PROT 6.1* 5.4*  ALBUMIN 3.8 2.7*   No results for input(s): LIPASE, AMYLASE in the last 168 hours. No results for input(s): AMMONIA in the last 168 hours. CBC:  Recent Labs Lab 04/09/15 0842 04/11/15 0529 04/12/15 0605 04/13/15 0428 04/14/15 0530  WBC 13.3* 6.1 4.2 4.6 4.3  NEUTROABS 11.9*  --   --   --   --   HGB 11.2* 9.5* 9.3* 10.1* 10.1*  HCT 33.9* 29.3* 28.1* 30.6* 31.6*  MCV 96.3 97.0 97.9 97.5 97.8  PLT 123* 84* 70* 75* 101*   Cardiac Enzymes: No results for input(s): CKTOTAL, CKMB, CKMBINDEX, TROPONINI in the last 168 hours. BNP (last 3 results) No results for input(s): PROBNP in the last 8760 hours. CBG:  Recent Labs Lab 04/13/15 1112 04/13/15 1739 04/13/15 2041 04/14/15 0806 04/14/15 1146  GLUCAP 118* 85 197* 83 131*    Recent Results (from the past 240 hour(s))  Blood culture (routine x 2)     Status: None (Preliminary result)   Collection Time: 04/09/15  2:09 PM  Result Value Ref Range Status   Specimen Description BLOOD RIGHT HAND  Final   Special Requests BOTTLES DRAWN AEROBIC ONLY 3CC  Final   Culture NO GROWTH 4 DAYS  Final   Report Status PENDING  Incomplete  Blood culture (routine x 2)     Status: None   Collection Time: 04/09/15  2:25 PM  Result Value Ref Range Status   Specimen Description BLOOD RIGHT ANTECUBITAL  Final   Special Requests IN PEDIATRIC BOTTLE 2CC  Final   Culture  Setup Time   Final    GRAM NEGATIVE RODS IN PEDIATRIC BOTTLE CRITICAL RESULT CALLED TO, READ BACK BY AND  VERIFIED WITH: CWICKER,RN @0554  04/10/15 MKELLY    Culture SERRATIA MARCESCENS  Final   Report Status 04/12/2015 FINAL  Final   Organism ID, Bacteria SERRATIA MARCESCENS  Final      Susceptibility   Serratia marcescens - MIC*    CEFAZOLIN >=64 RESISTANT Resistant     CEFEPIME <=1 SENSITIVE Sensitive     CEFTAZIDIME <=1 SENSITIVE Sensitive     CEFTRIAXONE <=1 SENSITIVE Sensitive     CIPROFLOXACIN <=0.25 SENSITIVE Sensitive     GENTAMICIN <=1 SENSITIVE Sensitive     TRIMETH/SULFA <=20 SENSITIVE Sensitive     * SERRATIA MARCESCENS  MRSA PCR Screening     Status: None   Collection Time: 04/10/15  6:22 AM  Result Value Ref Range Status   MRSA by PCR NEGATIVE NEGATIVE Final    Comment:        The GeneXpert MRSA Assay (FDA approved for NASAL specimens only), is one component of a comprehensive MRSA colonization surveillance program. It is not intended to diagnose  MRSA infection nor to guide or monitor treatment for MRSA infections.          Studies: No results found.      Scheduled Meds: . calcium acetate  2,001 mg Oral BID WC  . [START ON 04/16/2015] cefTAZidime (FORTAZ)  IV  2 g Intravenous Q T,Th,Sat-1800  . cinacalcet  90 mg Oral Q breakfast  . gabapentin  300 mg Oral QHS  . insulin aspart  0-15 Units Subcutaneous TID WC  . insulin aspart  0-5 Units Subcutaneous QHS  . insulin glargine  10 Units Subcutaneous QHS  . latanoprost  1 drop Left Eye QHS  . multivitamin  1 tablet Oral QHS  . pravastatin  40 mg Oral q1800  . rOPINIRole  0.5 mg Oral QHS  . saccharomyces boulardii  250 mg Oral BID  . warfarin  10 mg Oral ONCE-1800  . warfarin   Does not apply Once  . Warfarin - Pharmacist Dosing Inpatient   Does not apply q1800   Continuous Infusions: . heparin 1,150 Units/hr (04/14/15 0420)    Principal Problem:   Cellulitis Active Problems:   Essential hypertension   ESRD on dialysis ([place])   PAD (peripheral artery disease) (HCC)   Leukocytosis   Chronic  diastolic congestive heart failure (HCC)   History of CVA with residual deficit   Bacteremia   Lactic acidosis    Time spent: 15 minutes.    Vernell Leep, MD, FACP, FHM. [facility] Pager [phone]  If 7PM-7AM, please contact night-coverage [email or web address] Password [email or web address] 04/14/2015, 12:47 PM    LOS: 5 days

## 2015-04-14 NOTE — Clinical Social Work Note (Signed)
[place] bed offer given to the patient at bedside.    Liz Beach MSW, [place], [place], JI:[phone]

## 2015-04-14 NOTE — Progress Notes (Signed)
Pharmacy Antibiotic Follow-up Note  Stefanie [name] is a 70 y.o. year-old female admitted on 04/09/2015.  The patient is currently on day 6 of 10 for LLE cellultis/ GNR Bacteremia (Serratia marcescens) .  Assessment/Plan: Has resolving cellulitis complicating a swollen left leg with hx of acute DVT in Nov 2016 in the same leg. Blood cultures on 04/09/15,1 of 2 cultures grew Serratia (sensitive to Ceftazidime, cefepime and cipro). Vanc/Zosyn was changed to [place] on 1/27 per Dr. Jonnie Finner with a stop date of 2/2 to complete 10 days.   Cefatz was not given after HD yesterday as scheduled, therefore will give Ceftaz 2 g x1 today and then continue on Tuesday and Thursday after HD to complete treatment course.   Temp (24hrs), Avg:98.1 F (36.7 C), Min:97.7 F (36.5 C), Max:98.4 F (36.9 C)   Recent Labs Lab 04/09/15 0842 04/11/15 0529 04/12/15 0605 04/13/15 0428 04/14/15 0530  WBC 13.3* 6.1 4.2 4.6 4.3     Recent Labs Lab 04/09/15 0842 04/11/15 0529  CREATININE 9.17* 11.20*   Estimated Creatinine Clearance: 4.7 mL/min (by C-G formula based on Cr of 11.2).    No Known Allergies  Antimicrobials this admission: Vanc 1/24>> 1/27 (last dose given 1/26) Zosyn 1/24 >>1/27 Ceftaz 1/27 >>  Microbiology results: 1/24 BCx X2 1of 2 GNR Serratia marcescens (Sensitive to ceftazidime, cefepime,cipro; Resistant to Cefazolin) 1/24 MRSA PCR: negative  Thank you for allowing pharmacy to be a part of this patient's care.  Joya San, PharmD Clinical Pharmacy Resident Pager # [phone] 04/14/2015 8:30 AM

## 2015-04-14 NOTE — Progress Notes (Signed)
Subjective:  No complaints  Objective Vital signs in last 24 hours: Filed Vitals:   04/13/15 1744 04/13/15 2042 04/14/15 0550 04/14/15 0933  BP: 152/74 144/62 139/58 129/50  Pulse: 67 70 62 64  Temp: 98 F (36.7 C) 98.4 F (36.9 C) 98.4 F (36.9 C) 97.3 F (36.3 C)  TempSrc: Oral Oral Oral Oral  Resp: 18 16 16 17   Height:      Weight:      SpO2: 100% 100% 100% 100%   Weight change: -1.226 kg (-2 lb 11.2 oz)  Physical Exam: General: alert , calm Heart: RRR, No mur, rub, gallop Lungs:bilat coarse wheezes /Nonlabored breating Abdomen: Sl obese,NT,ND, BS pos Extremities: Left pedal edema trace  Skin: no overt rash/ left lower leg( calf) dark color/lNON tender to Palpitation NOW  Dialysis Access: LFem AVGG pos bruit  CXR 1/24 central vasc congestion  OP Dialysis Orders: Center: [facility] on TTS. EDW 73.0 kg HD Bath 2k,2.ca Time 3hr 30 min Heparin 4000. Access L fem. avgg  Calcitriol 1.0 mcg Po /HD/ Mircera 75 mcg q 2wks last on 04/02/15 hd  Other op lab HGB 11 Ca 9.2 phos 3.5 pth 690    Assessment: 1. LLE cellulitis w serratia bacteremia - for 10 days IV abx / fortaz w HD 2. ESRD - TTS HD  3. HTN - bp's recovering after septic episode, will resume coreg/ acei 4. LLE DVT 11/16 - recently was switched to Eliquis for this which is not indicated in ESRD so changing back to coumadin; getting IV heparin bridge. Dr. Acie Fredrickson in [place] coumadin clinic will manage INR as OP 5. Volume/ IS pulm edema - 81 > 73 kg here, now at dry wt. Dialyzed w BP in 70's and no sx  6. Anemia - hgb 11.2 > 9.5 ESA Next week hd / fu hgb trend 7.  HO COPD  Prior prn neb.  8. Disposition = SNF when INR up  Plan - next HD [place] pager [phone]    cell [phone] 04/14/2015, 4:49 PM    Labs: Basic Metabolic Panel:  Recent Labs Lab 04/09/15 0842 04/11/15 0529  NA 143 139  K 4.9 5.0  CL 98* 100*  CO2 24 25  GLUCOSE 157* 117*   BUN 88* 97*  CREATININE 9.17* 11.20*  CALCIUM 9.7 8.8*   Liver Function Tests:  Recent Labs Lab 04/09/15 0842 04/11/15 0529  AST 68* 50*  ALT 104* 97*  ALKPHOS 70 61  BILITOT 0.8 0.9  PROT 6.1* 5.4*  ALBUMIN 3.8 2.7*   No results for input(s): LIPASE, AMYLASE in the last 168 hours. No results for input(s): AMMONIA in the last 168 hours. CBC:  Recent Labs Lab 04/09/15 0842 04/11/15 0529 04/12/15 0605 04/13/15 0428 04/14/15 0530  WBC 13.3* 6.1 4.2 4.6 4.3  NEUTROABS 11.9*  --   --   --   --   HGB 11.2* 9.5* 9.3* 10.1* 10.1*  HCT 33.9* 29.3* 28.1* 30.6* 31.6*  MCV 96.3 97.0 97.9 97.5 97.8  PLT 123* 84* 70* 75* 101*   Cardiac Enzymes: No results for input(s): CKTOTAL, CKMB, CKMBINDEX, TROPONINI in the last 168 hours. CBG:  Recent Labs Lab 04/13/15 1112 04/13/15 1739 04/13/15 2041 04/14/15 0806 04/14/15 1146  GLUCAP 118* 85 197* 83 131*    Studies/Results: No results found. Medications: . heparin 1,150 Units/hr (04/14/15 0420)   . calcium acetate  2,001 mg Oral BID WC  . [START ON 04/16/2015] cefTAZidime (FORTAZ)  IV  2 g Intravenous Q T,Th,Sat-1800  . cinacalcet  90 mg Oral Q breakfast  . gabapentin  100 mg Oral QHS  . insulin aspart  0-15 Units Subcutaneous TID WC  . insulin aspart  0-5 Units Subcutaneous QHS  . insulin glargine  10 Units Subcutaneous QHS  . latanoprost  1 drop Left Eye QHS  . multivitamin  1 tablet Oral QHS  . pravastatin  40 mg Oral q1800  . rOPINIRole  0.5 mg Oral QHS  . saccharomyces boulardii  250 mg Oral BID  . warfarin  10 mg Oral ONCE-1800  . warfarin   Does not apply Once  . Warfarin - Pharmacist Dosing Inpatient   Does not apply [phone]

## 2015-04-14 NOTE — Progress Notes (Signed)
ANTICOAGULATION CONSULT NOTE - Follow Up Consult  Pharmacy Consult for heparin/warfarin Indication: DVT  No Known Allergies  Patient Measurements: Height: 5\' 4"  (162.6 cm) Weight: 162 lb 0.6 oz (73.5 kg) (Standing scale) IBW/kg (Calculated) : 54.7 Heparin Dosing Weight: 70.6 KG  Labs:  Recent Labs  04/12/15 0605  04/12/15 2008 04/13/15 0428 04/13/15 1200 04/13/15 1242 04/14/15 0530  HGB 9.3*  --   --  10.1*  --   --  10.1*  HCT 28.1*  --   --  30.6*  --   --  31.6*  PLT 70*  --   --  75*  --   --  101*  APTT  --   < > 78* 72*  --   --  86*  LABPROT  --   --   --   --  15.1  --  14.9  INR  --   --   --   --  1.17  --  1.15  HEPARINUNFRC  --   < > 0.97* 0.69  --  0.54 0.42  < > = values in this interval not displayed.  Estimated Creatinine Clearance: 4.7 mL/min (by C-G formula based on Cr of 11.2).  Assessment: Patient on warfarin previously for recent LLE DVT then switch to apixaban. Patient is ESRD patient which apixaban has not been studied in. As a result, patient was converted to IV heparin as a bridge to warfarin.  Patient currently at therapeutic HL on 1150 units/hr heparin. INR remains subtherapeutic after 2 days of 7.5 mg coumadin.  Goal of Therapy:  HL 0.3-0.7 INR 2-3 Monitor platelets by anticoagulation protocol: Yes   Plan:  Continue heparin at 1150 units/hr Coumadin 10 mg PO x1 Next levels with am labs Daily HL, INR, CBC Monitor for s/sx of bleeding   Joya San, PharmD Clinical Pharmacy Resident Pager # [phone] 04/14/2015 8:06 AM

## 2015-04-15 LAB — GLUCOSE, CAPILLARY
GLUCOSE-CAPILLARY: 106 mg/dL — AB (ref 65–99)
GLUCOSE-CAPILLARY: 214 mg/dL — AB (ref 65–99)
Glucose-Capillary: 136 mg/dL — ABNORMAL HIGH (ref 65–99)
Glucose-Capillary: 161 mg/dL — ABNORMAL HIGH (ref 65–99)

## 2015-04-15 LAB — CBC
HEMATOCRIT: 28.8 % — AB (ref 36.0–46.0)
HEMOGLOBIN: 9.4 g/dL — AB (ref 12.0–15.0)
MCH: 32.2 pg (ref 26.0–34.0)
MCHC: 32.6 g/dL (ref 30.0–36.0)
MCV: 98.6 fL (ref 78.0–100.0)
Platelets: 86 10*3/uL — ABNORMAL LOW (ref 150–400)
RBC: 2.92 MIL/uL — ABNORMAL LOW (ref 3.87–5.11)
RDW: 17.5 % — AB (ref 11.5–15.5)
WBC: 3.5 10*3/uL — ABNORMAL LOW (ref 4.0–10.5)

## 2015-04-15 LAB — PROTIME-INR
INR: 1.45 (ref 0.00–1.49)
PROTHROMBIN TIME: 17.7 s — AB (ref 11.6–15.2)

## 2015-04-15 LAB — HEPARIN LEVEL (UNFRACTIONATED): HEPARIN UNFRACTIONATED: 0.29 [IU]/mL — AB (ref 0.30–0.70)

## 2015-04-15 MED ORDER — CALCITRIOL 0.5 MCG PO CAPS
1.0000 ug | ORAL_CAPSULE | ORAL | Status: DC
  Administered 2015-04-16 – 2015-04-18 (×2): 1 ug via ORAL
  Filled 2015-04-15: qty 4
  Filled 2015-04-15 (×2): qty 2

## 2015-04-15 MED ORDER — WARFARIN SODIUM 10 MG PO TABS
10.0000 mg | ORAL_TABLET | Freq: Once | ORAL | Status: AC
  Administered 2015-04-15: 10 mg via ORAL
  Filled 2015-04-15: qty 1

## 2015-04-15 MED ORDER — DARBEPOETIN ALFA 60 MCG/0.3ML IJ SOSY
60.0000 ug | PREFILLED_SYRINGE | INTRAMUSCULAR | Status: DC
  Administered 2015-04-16: 60 ug via INTRAVENOUS
  Filled 2015-04-15: qty 0.3

## 2015-04-15 NOTE — Care Management Important Message (Signed)
Important Message  Patient Details  Name: Stefanie [name] MRN: [id number] Date of Birth: [birth year]   Medicare Important Message Given:  Yes    Barb Merino Jammie Clink 04/15/2015, 3:25 PM

## 2015-04-15 NOTE — Progress Notes (Signed)
PROGRESS NOTE    Stefanie [name] [id number] DOB: 04-29-1945 DOA: 04/09/2015 PCP: Bonnita Nasuti, MD  HPI/Brief narrative 70 year old female patient with history of ESRD on TTS HD, DM, CVA, HTN, DVT LAD, presented from dialysis Center in [place] to [facility] ED on 04/09/15 with complaints of left leg pain. Vascular surgery evaluated ED and did not feel vascular insufficiency was etiology. Admitted for cellulitis of left leg and bacteremia. Nephrology following.   Assessment/Plan:   1. Left leg cellulitis: h/o PAD status post stenting to left common iliac vein and left common femoral vein. Patient evaluated by vascular surgery on 1/24 and pain felt not likely from vascular insufficiency. Venous Doppler negative for acute DVT. Started empirically on IV vancomycin and Zosyn. Improving. Nephrology discussed with infectious disease who recommended transitioning to IV Fortaz across HD and treat for total 7-10 days. 2. Serratia bacteremia: One of 2 blood cultures positive. Initially started on vancomycin and Zosyn. Switched to IV Peconic across HD as per ID recommendations. 3. ESRD on TTS HD: Nephrology consulted and management per nephrology. 4. Chronic systolic CHF: Echo November 2016: LVEF 35% and grade 2 diastolic dysfunction. Compensated clinically but dry weight up by 7-8 kilos per nephrology who have discontinued lisinopril and plan to Max UF with HD. Continue Coreg. 5. History of DVT left leg: Was on Coumadin initially when DVT first diagnosed in November 2016. At some point was seen at [facility] ED and started on Apixaban. Nephrology has weighed in and indicate that Apixaban is not well studied in ESRD patients and hence was discontinued and patient was started on Coumadin and IV heparin bridge. INR on 1/29:1.45. Dr. Acie Fredrickson in [place] coumadin clinic will manage INR as OP 6. Essential hypertension: Controlled. 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 Reasonable inpatient  control. 8. 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 Thrombocytopenia? Related to bacteremia or antibiotics. Hemoglobin stable. Platelet count improving 70 > 75 > 101>86. Fluctuating.  Follow CBCs. 9. History of CVA with residual aphasia  DVT prophylaxis: Apixaban > change to Coumadin & IV heparin bridge, per pharmacy. Code Status: Full Family Communication: Discussed with daughter at bedside. Disposition Plan: DC to SNF when medically stable (per PT 1/26), possibly in 1-2 days, pending therapeutic INR. Will get PT to reevaluate.   Consultants:  Vascular surgery  Nephrology  Procedures:  Hemodialysis  Antimicrobials:  Zosyn 1/24 > discontinued  Vancomycin 1/24 > discontinue  IV Tressie Ellis 1/27 >  Subjective: Denies left leg pain. Denies complaints.  Objective: Filed Vitals:   04/14/15 1746 04/14/15 2048 04/15/15 0510 04/15/15 0928  BP: 157/73 160/59 146/54 136/54  Pulse: 67 63 58 59  Temp: 97.7 F (36.5 C) 98.1 F (36.7 C) 98.2 F (36.8 C) 97.7 F (36.5 C)  TempSrc: Oral   Oral  Resp: 18 19 21 19   Height:      Weight:  75.751 kg (167 lb)    SpO2: 100% 100% 100% 100%    Intake/Output Summary (Last 24 hours) at 04/15/15 1224 Last data filed at 04/15/15 0928  Gross per 24 hour  Intake 1300.62 ml  Output      0 ml  Net 1300.62 ml   Filed Weights   04/13/15 1209 04/13/15 1630 04/14/15 2048  Weight: 77.7 kg (171 lb 4.8 oz) 73.5 kg (162 lb 0.6 oz) 75.751 kg (167 lb)    Exam:  General exam: Pleasant middle-aged female sitting up comfortably in chair this morning. Respiratory system:  Clear. No increased work of breathing. Cardiovascular system: S1 & S2 heard, RRR. No JVD, murmurs, gallops, clicks or pedal edema. Gastrointestinal system: Abdomen is nondistended, soft and nontender. Normal bowel sounds heard. Central nervous system: Alert and oriented. No focal neurological deficits. Extremities: Symmetric 5 x 5 power. Left leg  slightly warm but no other acute findings. Probably back to baseline. Skin is hyperpigmented. Palpable left femoral AVG thrill. Nonfunctional left upper arm AVF.   Data Reviewed: Basic Metabolic Panel:  Recent Labs Lab 04/09/15 0842 04/11/15 0529  NA 143 139  K 4.9 5.0  CL 98* 100*  CO2 24 25  GLUCOSE 157* 117*  BUN 88* 97*  CREATININE 9.17* 11.20*  CALCIUM 9.7 8.8*   Liver Function Tests:  Recent Labs Lab 04/09/15 0842 04/11/15 0529  AST 68* 50*  ALT 104* 97*  ALKPHOS 70 61  BILITOT 0.8 0.9  PROT 6.1* 5.4*  ALBUMIN 3.8 2.7*   No results for input(s): LIPASE, AMYLASE in the last 168 hours. No results for input(s): AMMONIA in the last 168 hours. CBC:  Recent Labs Lab 04/09/15 0842 04/11/15 0529 04/12/15 0605 04/13/15 0428 04/14/15 0530 04/15/15 0605  WBC 13.3* 6.1 4.2 4.6 4.3 3.5*  NEUTROABS 11.9*  --   --   --   --   --   HGB 11.2* 9.5* 9.3* 10.1* 10.1* 9.4*  HCT 33.9* 29.3* 28.1* 30.6* 31.6* 28.8*  MCV 96.3 97.0 97.9 97.5 97.8 98.6  PLT 123* 84* 70* 75* 101* 86*   Cardiac Enzymes: No results for input(s): CKTOTAL, CKMB, CKMBINDEX, TROPONINI in the last 168 hours. BNP (last 3 results) No results for input(s): PROBNP in the last 8760 hours. CBG:  Recent Labs Lab 04/14/15 0806 04/14/15 1146 04/14/15 1705 04/14/15 2046 04/15/15 0742  GLUCAP 83 131* 196* 191* 136*    Recent Results (from the past 240 hour(s))  Blood culture (routine x 2)     Status: None   Collection Time: 04/09/15  2:09 PM  Result Value Ref Range Status   Specimen Description BLOOD RIGHT HAND  Final   Special Requests BOTTLES DRAWN AEROBIC ONLY 3CC  Final   Culture NO GROWTH 5 DAYS  Final   Report Status 04/14/2015 FINAL  Final  Blood culture (routine x 2)     Status: None   Collection Time: 04/09/15  2:25 PM  Result Value Ref Range Status   Specimen Description BLOOD RIGHT ANTECUBITAL  Final   Special Requests IN PEDIATRIC BOTTLE 2CC  Final   Culture  Setup Time   Final     GRAM NEGATIVE RODS IN PEDIATRIC BOTTLE CRITICAL RESULT CALLED TO, READ BACK BY AND VERIFIED WITH: CWICKER,RN @0554  04/10/15 MKELLY    Culture SERRATIA MARCESCENS  Final   Report Status 04/12/2015 FINAL  Final   Organism ID, Bacteria SERRATIA MARCESCENS  Final      Susceptibility   Serratia marcescens - MIC*    CEFAZOLIN >=64 RESISTANT Resistant     CEFEPIME <=1 SENSITIVE Sensitive     CEFTAZIDIME <=1 SENSITIVE Sensitive     CEFTRIAXONE <=1 SENSITIVE Sensitive     CIPROFLOXACIN <=0.25 SENSITIVE Sensitive     GENTAMICIN <=1 SENSITIVE Sensitive     TRIMETH/SULFA <=20 SENSITIVE Sensitive     * SERRATIA MARCESCENS  MRSA PCR Screening     Status: None   Collection Time: 04/10/15  6:22 AM  Result Value Ref Range Status   MRSA by PCR NEGATIVE NEGATIVE Final    Comment:  The GeneXpert MRSA Assay (FDA approved for NASAL specimens only), is one component of a comprehensive MRSA colonization surveillance program. It is not intended to diagnose MRSA infection nor to guide or monitor treatment for MRSA infections.          Studies: No results found.      Scheduled Meds: . [START ON 04/16/2015] calcitRIOL  1 mcg Oral Q T,Th,Sa-HD  . calcium acetate  2,001 mg Oral BID WC  . carvedilol  3.125 mg Oral BID WC  . [START ON 04/16/2015] cefTAZidime (FORTAZ)  IV  2 g Intravenous Q T,Th,Sat-1800  . cinacalcet  90 mg Oral Q breakfast  . [START ON 04/16/2015] darbepoetin (ARANESP) injection - DIALYSIS  60 mcg Intravenous Q Tue-HD  . gabapentin  100 mg Oral QHS  . insulin aspart  0-15 Units Subcutaneous TID WC  . insulin aspart  0-5 Units Subcutaneous QHS  . insulin glargine  10 Units Subcutaneous QHS  . latanoprost  1 drop Left Eye QHS  . lisinopril  30 mg Oral QHS  . multivitamin  1 tablet Oral QHS  . pravastatin  40 mg Oral q1800  . rOPINIRole  0.5 mg Oral QHS  . saccharomyces boulardii  250 mg Oral BID  . warfarin  10 mg Oral ONCE-1800  . warfarin   Does not apply Once  .  Warfarin - Pharmacist Dosing Inpatient   Does not apply q1800   Continuous Infusions: . heparin 1,250 Units/hr (04/15/15 [id number])    Principal Problem:   Cellulitis Active Problems:   Essential hypertension   ESRD on dialysis (HCC)   PAD (peripheral artery disease) (HCC)   Leukocytosis   Chronic diastolic congestive heart failure (HCC)   History of CVA with residual deficit   Bacteremia   Lactic acidosis    Time spent: 15 minutes.    Vernell Leep, MD, FACP, FHM. [facility] Pager [phone]  If 7PM-7AM, please contact night-coverage [email or web address] Password [email or web address] 04/15/2015, 12:24 PM    LOS: 6 days

## 2015-04-15 NOTE — Progress Notes (Signed)
ANTICOAGULATION CONSULT NOTE - Follow Up Consult  Pharmacy Consult for heparin/warfarin Indication: DVT  No Known Allergies  Patient Measurements: Height: 5\' 4"  (162.6 cm) Weight: 167 lb (75.751 kg) IBW/kg (Calculated) : 54.7 Heparin Dosing Weight: 70.6 KG  Labs:  Recent Labs  04/12/15 2008  04/13/15 0428 04/13/15 1200 04/13/15 1242 04/14/15 0530 04/15/15 0605  HGB  --   < > 10.1*  --   --  10.1* 9.4*  HCT  --   --  30.6*  --   --  31.6* 28.8*  PLT  --   --  75*  --   --  101* 86*  APTT 78*  --  72*  --   --  86*  --   LABPROT  --   --   --  15.1  --  14.9 17.7*  INR  --   --   --  1.17  --  1.15 1.45  HEPARINUNFRC 0.97*  --  0.69  --  0.54 0.42 0.29*  < > = values in this interval not displayed.  Estimated Creatinine Clearance: 4.7 mL/min (by C-G formula based on Cr of 11.2).  Assessment: Patient on warfarin previously for recent LLE DVT then switched to apixaban. Patient is ESRD patient (not well-studied in this population). As a result, patient was converted to IV heparin as a bridge with warfarin 1/27.  Patient currently very slightly subtherapeutic HL (0.29) on 1150 units/hr heparin. INR remains subtherapeutic, 1.15>>1.45. Hg low stable 9.4, plt low 86 (decreased). No bleed issues documented.  Goal of Therapy:  HL 0.3-0.7 INR 2-3 Monitor platelets by anticoagulation protocol: Yes   Plan:  Increase heparin slightly to 1250 units/hr Coumadin 10 mg PO x1 Next levels with am labs Daily HL, INR, CBC Monitor for s/sx of bleeding   Elicia Lamp, PharmD, [facility] Clinical Pharmacist Pager [phone] 04/15/2015 8:40 AM

## 2015-04-15 NOTE — Discharge Instructions (Addendum)

## 2015-04-15 NOTE — Progress Notes (Signed)
Physical Therapy Treatment Patient Details Name: [name] Holleman MRN: [id number] DOB: September 30, 1945 Today's Date: 04/15/2015    History of Present Illness [name] Seelman is a 70 y.o. female with a Past Medical History of HTN, HLD, DM, CVA, protein calorie malnutrition, ESRD on dialysis, CHF, who presents with cellulitis, bacteremia, in need of dialysis    PT Comments    Pt able to tolerate ambulation this date however was labored effort and very unsteady. Con't to recommend ST-SNF to achieve safe mod I level of function as PTA for safe transition home.  Follow Up Recommendations  SNF     Equipment Recommendations  None recommended by PT    Recommendations for Other Services       Precautions / Restrictions Precautions Precautions: Fall Restrictions Weight Bearing Restrictions: No    Mobility  Bed Mobility               General bed mobility comments: pt up in chair  Transfers Overall transfer level: Needs assistance Equipment used: Rolling walker (2 wheeled) Transfers: Sit to/from Stand Sit to Stand: Min assist         General transfer comment: v/c's for hand placement, minA for intial transfer, increaed time  Ambulation/Gait Ambulation/Gait assistance: Min assist (+2 beneficial for chair follow/line management) Ambulation Distance (Feet): 40 Feet Assistive device: Rolling walker (2 wheeled) Gait Pattern/deviations: Step-to pattern;Decreased step length - right;Decreased stance time - right;Wide base of support;Trunk flexed Gait velocity: slow Gait velocity interpretation: Below normal speed for age/gender General Gait Details: pt with minimal step height/shuffle like gait pattern. pt amb with bilat knee locked in extension. R LE signifcant decreased step length. v/c's to stay in walker and look straight ahead and stand up tall   Stairs            Wheelchair Mobility    Modified Rankin (Stroke Patients Only)       Balance Overall balance  assessment: Needs assistance Sitting-balance support: Feet supported;No upper extremity supported Sitting balance-Leahy Scale: Fair     Standing balance support: Bilateral upper extremity supported Standing balance-Leahy Scale: Poor Standing balance comment: requires RW                    Cognition Arousal/Alertness: Awake/alert Behavior During Therapy: WFL for tasks assessed/performed Overall Cognitive Status: Within Functional Limits for tasks assessed                      Exercises      General Comments        Pertinent Vitals/Pain Pain Assessment: No/denies pain    Home Living                      Prior Function            PT Goals (current goals can now be found in the care plan section) Progress towards PT goals: Progressing toward goals    Frequency  Min 3X/week    PT Plan Current plan remains appropriate    Co-evaluation             End of Session Equipment Utilized During Treatment: Gait belt Activity Tolerance: Patient tolerated treatment well Patient left: in chair;with call bell/phone within reach;with chair alarm set     Time: [id number] PT Time Calculation (min) (ACUTE ONLY): 19 min  Charges:  $Gait Training: 8-22 mins  G CodesKingsley Callander 04/15/2015, 4:39 PM  Kittie Plater, PT, DPT Pager #: [phone] Office #: [phone]

## 2015-04-15 NOTE — Progress Notes (Signed)
[facility] Progress Note  Assessment/Plan: 1. LLE cellulitis w serratia bacteremia - for 10 days IV abx / fortaz w HD. Afebrile. Per primary 2. ESRD -TTS at [facility]. For HD tomorrow. Last K+ 5.0. Use 2.0 K bath.  3. Anemia - HGB 9.4. Last ESA dose Mircera 75 mcg 04/02/15. Will give ESA dose tomorrow.  4. Secondary hyperparathyroidism - Ca 8.8 C Ca 9.84 Continue binders, sensipar and calcitriol as ordered.  5. HTN/volume - Last HD 04/13/15. Net UF 4000 post wt 73.5 kg. Wt today last wt 75.75 kg. Will attempt 3 liters 6. Nutrition - Albumin 2.7. Renal diet with Fld restrictions 7. LLE DVT: Per primary. On Heparin drip bridging to coumadin. Dr. Acie Fredrickson in [place] coumadin clinic will manage INR as OP 8. H/O COPD: Per primary.  9. DM: per primary. S/S insulin. BS controlled.   Disposition: home when INR therapeutic  [name] NP-C 04/15/2015, 11:01 AM  [facility] [phone]  Patient seen and examined, agree with above note with above modifications. Admitted with cellulitis/DVT and bacteremia- better- to continue fortaz with HD- home when INR is therapeutic  [name], MD 04/15/2015     Subjective:  "I'm doing OK". Told me she feels so much better  Denies pain in LLE, chest pain, SOB.   Objective Filed Vitals:   04/14/15 1746 04/14/15 2048 04/15/15 0510 04/15/15 0928  BP: 157/73 160/59 146/54 136/54  Pulse: 67 63 58 59  Temp: 97.7 F (36.5 C) 98.1 F (36.7 C) 98.2 F (36.8 C) 97.7 F (36.5 C)  TempSrc: Oral   Oral  Resp: 18 19 21 19   Height:      Weight:  75.751 kg (167 lb)    SpO2: 100% 100% 100% 100%   Physical Exam General: elderly chronically ill appearing female in NAD- looks good  Heart: S1, S2, No M/G/R Lungs: Bilateral breath sounds CTA A/P Abdomen: soft nontender with active BS Extremities: No LE edema. Healing cellulitis LLE Dialysis Access:  L thigh AVG + thrill. + bruit  Dialysis Orders:  [facility] TTS 3 hrs 30 min, 180NRe Optiflux, BFR 450, DFR Autoflow 1.5, EDW 73 (kg), Dialysate 2.0 K, 2.0 Ca, UFR Profile: Profile 4, Sodium Model: Linear, Access:  L thigh AV Graft  Heparin: 4000 units per treatment Calcitriol: 1.0 mcg PO q TTS (last dose 04/06/15) Mircera: 75 mcg IV q 2 weeks (last dose 04/02/15)   Additional Objective Labs: Basic Metabolic Panel:  Recent Labs Lab 04/09/15 0842 04/11/15 0529  NA 143 139  K 4.9 5.0  CL 98* 100*  CO2 24 25  GLUCOSE 157* 117*  BUN 88* 97*  CREATININE 9.17* 11.20*  CALCIUM 9.7 8.8*   Liver Function Tests:  Recent Labs Lab 04/09/15 0842 04/11/15 0529  AST 68* 50*  ALT 104* 97*  ALKPHOS 70 61  BILITOT 0.8 0.9  PROT 6.1* 5.4*  ALBUMIN 3.8 2.7*   No results for input(s): LIPASE, AMYLASE in the last 168 hours. CBC:  Recent Labs Lab 04/09/15 0842 04/11/15 0529 04/12/15 0605 04/13/15 0428 04/14/15 0530 04/15/15 0605  WBC 13.3* 6.1 4.2 4.6 4.3 3.5*  NEUTROABS 11.9*  --   --   --   --   --   HGB 11.2* 9.5* 9.3* 10.1* 10.1* 9.4*  HCT 33.9* 29.3* 28.1* 30.6* 31.6* 28.8*  MCV 96.3 97.0 97.9 97.5 97.8 98.6  PLT 123* 84* 70* 75* 101* 86*   Blood Culture    Component Value Date/Time  SDES BLOOD RIGHT ANTECUBITAL 04/09/2015 1425   SPECREQUEST IN PEDIATRIC BOTTLE 2CC 04/09/2015 1425   CULT SERRATIA MARCESCENS 04/09/2015 1425   REPTSTATUS 04/12/2015 FINAL 04/09/2015 1425    Cardiac Enzymes: No results for input(s): CKTOTAL, CKMB, CKMBINDEX, TROPONINI in the last 168 hours. CBG:  Recent Labs Lab 04/14/15 0806 04/14/15 1146 04/14/15 1705 04/14/15 2046 04/15/15 0742  GLUCAP 83 131* 196* 191* 136*   Iron Studies: No results for input(s): IRON, TIBC, TRANSFERRIN, FERRITIN in the last 72 hours. @lablastinr3 @ Studies/Results: No results found. Medications: . heparin 1,250 Units/hr (04/15/15 0852)   . calcium acetate  2,001 mg Oral BID WC  . carvedilol  3.125 mg Oral BID WC  . [START ON 04/16/2015] cefTAZidime  (FORTAZ)  IV  2 g Intravenous Q T,Th,Sat-1800  . cinacalcet  90 mg Oral Q breakfast  . gabapentin  100 mg Oral QHS  . insulin aspart  0-15 Units Subcutaneous TID WC  . insulin aspart  0-5 Units Subcutaneous QHS  . insulin glargine  10 Units Subcutaneous QHS  . latanoprost  1 drop Left Eye QHS  . lisinopril  30 mg Oral QHS  . multivitamin  1 tablet Oral QHS  . pravastatin  40 mg Oral q1800  . rOPINIRole  0.5 mg Oral QHS  . saccharomyces boulardii  250 mg Oral BID  . warfarin  10 mg Oral ONCE-1800  . warfarin   Does not apply Once  . Warfarin - Pharmacist Dosing Inpatient   Does not apply [phone]

## 2015-04-16 DIAGNOSIS — Z992 Dependence on renal dialysis: Secondary | ICD-10-CM | POA: Diagnosis not present

## 2015-04-16 DIAGNOSIS — N186 End stage renal disease: Secondary | ICD-10-CM | POA: Diagnosis not present

## 2015-04-16 DIAGNOSIS — E1129 Type 2 diabetes mellitus with other diabetic kidney complication: Secondary | ICD-10-CM | POA: Diagnosis not present

## 2015-04-16 LAB — CBC
HCT: 29.4 % — ABNORMAL LOW (ref 36.0–46.0)
HCT: 33.4 % — ABNORMAL LOW (ref 36.0–46.0)
Hemoglobin: 9.4 g/dL — ABNORMAL LOW (ref 12.0–15.0)
Hemoglobin: 10.8 g/dL — ABNORMAL LOW (ref 12.0–15.0)
MCH: 30.6 pg (ref 26.0–34.0)
MCH: 31.1 pg (ref 26.0–34.0)
MCHC: 32 g/dL (ref 30.0–36.0)
MCHC: 32.3 g/dL (ref 30.0–36.0)
MCV: 95.8 fL (ref 78.0–100.0)
MCV: 96.3 fL (ref 78.0–100.0)
PLATELETS: 107 10*3/uL — AB (ref 150–400)
Platelets: 107 10*3/uL — ABNORMAL LOW (ref 150–400)
RBC: 3.07 MIL/uL — AB (ref 3.87–5.11)
RBC: 3.47 MIL/uL — ABNORMAL LOW (ref 3.87–5.11)
RDW: 17.4 % — AB (ref 11.5–15.5)
RDW: 17.3 % — ABNORMAL HIGH (ref 11.5–15.5)
WBC: 4.1 10*3/uL (ref 4.0–10.5)
WBC: 4.2 10*3/uL (ref 4.0–10.5)

## 2015-04-16 LAB — RENAL FUNCTION PANEL
Albumin: 2.7 g/dL — ABNORMAL LOW (ref 3.5–5.0)
Anion gap: 14 (ref 5–15)
BUN: 44 mg/dL — ABNORMAL HIGH (ref 6–20)
CO2: 24 mmol/L (ref 22–32)
Calcium: 8 mg/dL — ABNORMAL LOW (ref 8.9–10.3)
Chloride: 97 mmol/L — ABNORMAL LOW (ref 101–111)
Creatinine, Ser: 7.89 mg/dL — ABNORMAL HIGH (ref 0.44–1.00)
GFR calc Af Amer: 5 mL/min — ABNORMAL LOW (ref >60)
GFR calc non Af Amer: 5 mL/min — ABNORMAL LOW (ref >60)
Glucose, Bld: 94 mg/dL (ref 65–99)
Phosphorus: 4.6 mg/dL (ref 2.5–4.6)
Potassium: 4.5 mmol/L (ref 3.5–5.1)
Sodium: 135 mmol/L (ref 135–145)

## 2015-04-16 LAB — PROTIME-INR
INR: 1.58 — ABNORMAL HIGH (ref 0.00–1.49)
PROTHROMBIN TIME: 18.9 s — AB (ref 11.6–15.2)

## 2015-04-16 LAB — GLUCOSE, CAPILLARY
Glucose-Capillary: 80 mg/dL (ref 65–99)
Glucose-Capillary: 100 mg/dL — ABNORMAL HIGH (ref 65–99)
Glucose-Capillary: 152 mg/dL — ABNORMAL HIGH (ref 65–99)
Glucose-Capillary: 234 mg/dL — ABNORMAL HIGH (ref 65–99)

## 2015-04-16 LAB — HEPARIN LEVEL (UNFRACTIONATED): Heparin Unfractionated: 0.57 IU/mL (ref 0.30–0.70)

## 2015-04-16 MED ORDER — SODIUM CHLORIDE 0.9 % IV SOLN: 100.0000 mL | INTRAVENOUS | Status: DC | PRN

## 2015-04-16 MED ORDER — LIDOCAINE HCL (PF) 1 % IJ SOLN: 5.0000 mL | INTRAMUSCULAR | Status: DC | PRN

## 2015-04-16 MED ORDER — PENTAFLUOROPROP-TETRAFLUOROETH EX AERO: 1.0000 "application " | INHALATION_SPRAY | CUTANEOUS | Status: DC | PRN

## 2015-04-16 MED ORDER — LIDOCAINE-PRILOCAINE 2.5-2.5 % EX CREA: 1.0000 "application " | TOPICAL_CREAM | CUTANEOUS | Status: DC | PRN

## 2015-04-16 MED ORDER — DARBEPOETIN ALFA 60 MCG/0.3ML IJ SOSY
PREFILLED_SYRINGE | INTRAMUSCULAR | Status: AC
  Filled 2015-04-16: qty 0.3

## 2015-04-16 MED ORDER — WARFARIN SODIUM 10 MG PO TABS
10.0000 mg | ORAL_TABLET | Freq: Once | ORAL | Status: AC
  Administered 2015-04-16: 10 mg via ORAL
  Filled 2015-04-16: qty 1

## 2015-04-16 NOTE — Progress Notes (Signed)
Physical Therapy Treatment Patient Details Name: Stefanie [name] MRN: [id number] DOB: December 03, 1945 Today's Date: 04/16/2015    History of Present Illness Stefanie [name] is a 70 y.o. female with a Past Medical History of HTN, HLD, DM, CVA, protein calorie malnutrition, ESRD on dialysis, CHF, who presents with cellulitis, bacteremia, in need of dialysis    PT Comments    Patient seen form mobility progression. Patient with continued difficulty ambulating secondary to pain in LE and fatigue. Patient was able to mobilize using RW with increased effort. Instability in BLEs with increased distance. Will continue to see and progress as tolerated. Continue to recommend ST SNF.   OF NOTE: BP elevated upon reaching chair [id number] systolic, decreased to [id number] with rest.  Follow Up Recommendations  SNF     Equipment Recommendations  None recommended by PT    Recommendations for Other Services       Precautions / Restrictions Precautions Precautions: Fall Restrictions Weight Bearing Restrictions: No    Mobility  Bed Mobility Overal bed mobility: Needs Assistance Bed Mobility: Supine to Sit;Rolling Rolling: Min assist   Supine to sit: Min assist     General bed mobility comments: Min assist to elevate trunk to upright in bed, assist to rotate hips to HOB. Patient with increased effort to perform.  Transfers Overall transfer level: Needs assistance Equipment used: Rolling walker (2 wheeled) Transfers: Sit to/from Stand Sit to Stand: Min assist         General transfer comment: Vcs for body positioning and technique. Cues for hand placement, min assist to power up to standing from lower chair surface.posterior BOS   Ambulation/Gait Ambulation/Gait assistance: Min assist Ambulation Distance (Feet): 50 Feet Assistive device: Rolling walker (2 wheeled) Gait Pattern/deviations: Step-to pattern;Decreased step length - right;Decreased stance time - right;Wide base of support;Trunk  flexed Gait velocity: decreased Gait velocity interpretation: Below normal speed for age/gender General Gait Details: patient continues to rpesent with alterred gait. decreased weight shift and antalgic pattern. VCs for upright posture.Continues to hyperextend knees with ambulation   Stairs            Wheelchair Mobility    Modified Rankin (Stroke Patients Only)       Balance   Sitting-balance support: Feet supported Sitting balance-Leahy Scale: Fair     Standing balance support: Bilateral upper extremity supported Standing balance-Leahy Scale: Poor Standing balance comment: heavy reliance on UE support                    Cognition Arousal/Alertness: Awake/alert Behavior During Therapy: Flat affect Overall Cognitive Status: Within Functional Limits for tasks assessed                      Exercises General Exercises - Lower Extremity Ankle Circles/Pumps: AROM;Both (30 seconds continuous)    General Comments        Pertinent Vitals/Pain Pain Assessment: 0-10 Faces Pain Scale: Hurts a little bit Pain Location: LLE ankle Pain Descriptors / Indicators: Grimacing;Guarding Pain Intervention(s): Limited activity within patient's tolerance;Monitored during session;Repositioned;Relaxation    Home Living                      Prior Function            PT Goals (current goals can now be found in the care plan section) Acute Rehab PT Goals PT Goal Formulation: With patient Time For Goal Achievement: 04/25/15 Potential to Achieve Goals: Good Progress towards PT goals:  Progressing toward goals    Frequency  Min 3X/week    PT Plan Current plan remains appropriate    Co-evaluation             End of Session Equipment Utilized During Treatment: Gait belt Activity Tolerance: Patient tolerated treatment well Patient left: in chair;with call bell/phone within reach;with chair alarm set     Time: [id number] PT Time Calculation  (min) (ACUTE ONLY): 18 min  Charges:  $Gait Training: 8-22 mins                    G CodesDuncan Dull 05/03/15, 11:46 AM Alben Deeds, PT DPT  [phone]

## 2015-04-16 NOTE — Progress Notes (Signed)
PT Cancellation Note  Patient Details Name: Stefanie [name] MRN: [id number] DOB: [birth year]   Cancelled Treatment:     Patient off the floor at HD.   Duncan Dull 04/16/2015, 10:40 AM Alben Deeds, PT DPT  [phone]

## 2015-04-16 NOTE — Progress Notes (Signed)
PROGRESS NOTE    [name] [place] [id number] DOB: 10/19/1945 DOA: 04/09/2015 PCP: Bonnita Nasuti, MD  HPI/Brief narrative 70 year old female patient with history of ESRD on TTS HD, DM, CVA, HTN, DVT LAD, presented from dialysis Center in [place] to [facility] ED on 04/09/15 with complaints of left leg pain. Vascular surgery evaluated ED and did not feel vascular insufficiency was etiology. Admitted for cellulitis of left leg and bacteremia. Nephrology following.   Assessment/Plan:   1. Left leg cellulitis: h/o PAD status post stenting to left common iliac vein and left common femoral vein. Patient evaluated by vascular surgery on 1/24 and pain felt not likely from vascular insufficiency. Venous Doppler negative for acute DVT. Started empirically on IV vancomycin and Zosyn. Improving. Nephrology discussed with infectious disease who recommended transitioning to IV Fortaz across HD and treat for total 7-10 days. 2. Serratia bacteremia: One of 2 blood cultures positive. Initially started on vancomycin and Zosyn. Switched to IV [place] across HD as per ID recommendations. 3. ESRD on TTS HD: Nephrology consulted and management per nephrology. 4. Chronic systolic CHF: Echo November 2016: LVEF 35% and grade 2 diastolic dysfunction. Compensated clinically but dry weight up by 7-8 kilos per nephrology who have discontinued lisinopril and plan to Max UF with HD. Continue Coreg. 5. History of DVT left leg: Was on Coumadin initially when DVT first diagnosed in November 2016. At some point was seen at [facility] ED and started on Apixaban. Nephrology has weighed in and indicate that Apixaban is not well studied in ESRD patients and hence was discontinued and patient was started on Coumadin and IV heparin bridge. INR on 1/31:1.58. Dr. Acie Fredrickson in [place] coumadin clinic will manage INR as OP 6. Essential hypertension: Controlled. 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 Reasonable inpatient  control. 8. 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 Thrombocytopenia? Related to bacteremia or antibiotics. Hemoglobin stable. Platelet count improving 70 > 75 > 101>86. Fluctuating.  Follow CBCs. 9. History of CVA with residual aphasia  DVT prophylaxis: Apixaban > change to Coumadin & IV heparin bridge, per pharmacy. Code Status: Full Family Communication: Discussed with daughter at bedside. Disposition Plan: DC to SNF when medically stable (per PT 1/26), possibly in 1-2 days, pending therapeutic INR. Will get PT to reevaluate.   Consultants:  Vascular surgery  Nephrology  Procedures:  Hemodialysis  Antimicrobials:  Zosyn 1/24 > discontinued  Vancomycin 1/24 > discontinue  IV [place]ie Ellis 1/27 >  Subjective: Denies left leg pain. Denies complaints. Seen at HD.  Objective: Filed Vitals:   04/16/15 0930 04/16/15 1000 04/16/15 1012 04/16/15 1107  BP: 143/60 146/79 137/69 169/50  Pulse: 57 57 66 66  Temp:   97.5 F (36.4 C) 97.8 F (36.6 C)  TempSrc:   Oral Oral  Resp: 20 18 18 16   Height:      Weight:   74.2 kg (163 lb 9.3 oz)   SpO2:    99%    Intake/Output Summary (Last 24 hours) at 04/16/15 1608 Last data filed at 04/16/15 1012  Gross per 24 hour  Intake    125 ml  Output   3000 ml  Net  -2875 ml   Filed Weights   04/14/15 2048 04/16/15 0642 04/16/15 1012  Weight: 75.751 kg (167 lb) 77.2 kg (170 lb 3.1 oz) 74.2 kg (163 lb 9.3 oz)    Exam:  General exam: Pleasant middle-aged female lying comfortably in bed undergoing HD this morning. Respiratory  system: Clear. No increased work of breathing. Cardiovascular system: S1 & S2 heard, RRR. No JVD, murmurs, gallops, clicks or pedal edema. Gastrointestinal system: Abdomen is nondistended, soft and nontender. Normal bowel sounds heard. Central nervous system: Alert and oriented. No focal neurological deficits. Extremities: Symmetric 5 x 5 power. Left leg slightly warm but no other  acute findings. Probably back to baseline. Skin is hyperpigmented. Palpable left femoral AVG thrill. Nonfunctional left upper arm AVF.   Data Reviewed: Basic Metabolic Panel:  Recent Labs Lab 04/11/15 0529 04/16/15 0640  NA 139 135  K 5.0 4.5  CL 100* 97*  CO2 25 24  GLUCOSE 117* 94  BUN 97* 44*  CREATININE 11.20* 7.89*  CALCIUM 8.8* 8.0*  PHOS  --  4.6   Liver Function Tests:  Recent Labs Lab 04/11/15 0529 04/16/15 0640  AST 50*  --   ALT 97*  --   ALKPHOS 61  --   BILITOT 0.9  --   PROT 5.4*  --   ALBUMIN 2.7* 2.7*   No results for input(s): LIPASE, AMYLASE in the last 168 hours. No results for input(s): AMMONIA in the last 168 hours. CBC:  Recent Labs Lab 04/13/15 0428 04/14/15 0530 04/15/15 0605 04/16/15 0640 04/16/15 1121  WBC 4.6 4.3 3.5* 4.1 4.2  HGB 10.1* 10.1* 9.4* 9.4* 10.8*  HCT 30.6* 31.6* 28.8* 29.4* 33.4*  MCV 97.5 97.8 98.6 95.8 96.3  PLT 75* 101* 86* 107* 107*   Cardiac Enzymes: No results for input(s): CKTOTAL, CKMB, CKMBINDEX, TROPONINI in the last 168 hours. BNP (last 3 results) No results for input(s): PROBNP in the last 8760 hours. CBG:  Recent Labs Lab 04/15/15 1232 04/15/15 1646 04/15/15 2040 04/16/15 1103 04/16/15 1145  GLUCAP 106* 161* 214* 80 100*    Recent Results (from the past 240 hour(s))  Blood culture (routine x 2)     Status: None   Collection Time: 04/09/15  2:09 PM  Result Value Ref Range Status   Specimen Description BLOOD RIGHT HAND  Final   Special Requests BOTTLES DRAWN AEROBIC ONLY 3CC  Final   Culture NO GROWTH 5 DAYS  Final   Report Status 04/14/2015 FINAL  Final  Blood culture (routine x 2)     Status: None   Collection Time: 04/09/15  2:25 PM  Result Value Ref Range Status   Specimen Description BLOOD RIGHT ANTECUBITAL  Final   Special Requests IN PEDIATRIC BOTTLE 2CC  Final   Culture  Setup Time   Final    GRAM NEGATIVE RODS IN PEDIATRIC BOTTLE CRITICAL RESULT CALLED TO, READ BACK BY AND  VERIFIED WITH: CWICKER,RN @0554  04/10/15 MKELLY    Culture SERRATIA MARCESCENS  Final   Report Status 04/12/2015 FINAL  Final   Organism ID, Bacteria SERRATIA MARCESCENS  Final      Susceptibility   Serratia marcescens - MIC*    CEFAZOLIN >=64 RESISTANT Resistant     CEFEPIME <=1 SENSITIVE Sensitive     CEFTAZIDIME <=1 SENSITIVE Sensitive     CEFTRIAXONE <=1 SENSITIVE Sensitive     CIPROFLOXACIN <=0.25 SENSITIVE Sensitive     GENTAMICIN <=1 SENSITIVE Sensitive     TRIMETH/SULFA <=20 SENSITIVE Sensitive     * SERRATIA MARCESCENS  MRSA PCR Screening     Status: None   Collection Time: 04/10/15  6:22 AM  Result Value Ref Range Status   MRSA by PCR NEGATIVE NEGATIVE Final    Comment:        The GeneXpert MRSA Assay (  FDA approved for NASAL specimens only), is one component of a comprehensive MRSA colonization surveillance program. It is not intended to diagnose MRSA infection nor to guide or monitor treatment for MRSA infections.          Studies: No results found.      Scheduled Meds: . calcitRIOL  1 mcg Oral Q T,Th,Sa-HD  . calcium acetate  2,001 mg Oral BID WC  . carvedilol  3.125 mg Oral BID WC  . cefTAZidime (FORTAZ)  IV  2 g Intravenous Q T,Th,Sat-1800  . cinacalcet  90 mg Oral Q breakfast  . darbepoetin (ARANESP) injection - DIALYSIS  60 mcg Intravenous Q Tue-HD  . gabapentin  100 mg Oral QHS  . insulin aspart  0-15 Units Subcutaneous TID WC  . insulin aspart  0-5 Units Subcutaneous QHS  . insulin glargine  10 Units Subcutaneous QHS  . latanoprost  1 drop Left Eye QHS  . lisinopril  30 mg Oral QHS  . multivitamin  1 tablet Oral QHS  . pravastatin  40 mg Oral q1800  . rOPINIRole  0.5 mg Oral QHS  . saccharomyces boulardii  250 mg Oral BID  . warfarin  10 mg Oral ONCE-1800  . warfarin   Does not apply Once  . Warfarin - Pharmacist Dosing Inpatient   Does not apply q1800   Continuous Infusions: . heparin 1,250 Units/hr (04/16/15 0230)    Principal  Problem:   Cellulitis Active Problems:   Essential hypertension   ESRD on dialysis ([place])   PAD (peripheral artery disease) (HCC)   Leukocytosis   Chronic diastolic congestive heart failure (HCC)   History of CVA with residual deficit   Bacteremia   Lactic acidosis    Time spent: 15 minutes.    Vernell Leep, MD, FACP, FHM. [facility] Pager [phone]  If 7PM-7AM, please contact night-coverage [email or web address] Password [email or web address] 04/16/2015, 4:08 PM    LOS: 7 days

## 2015-04-16 NOTE — Procedures (Signed)
Patient was seen on dialysis and the procedure was supervised.  BFR 400  Via thigh AVG BP is  146/79.   Patient appears to be tolerating treatment well  Stefanie [name] A 04/16/2015

## 2015-04-16 NOTE — Progress Notes (Signed)
Stefanie [name] Progress Note  Assessment/Plan: 1. LLE cellulitis w serratia bacteremia - for 10 days IV abx / fortaz w HD- will need end date at discharge. Afebrile. Per primary. Appears resolving  2. ESRD -TTS at [facility]. On HD now. Stable.K+ 4.5 3. Anemia - HGB 9.4. Again today. Given Mircera on 1/17 Will give ESA dose today, follow HGB  4. Secondary hyperparathyroidism - Ca 8.0 C Ca 9.04 Continue binders, sensipar and calcitriol as ordered. phos 4.6 5. HTN/volume - BP remains stable. UFG 3500 today.  6. Nutrition - Albumin 2.7. Renal diet with [place]ld restrictions 7. LLE DVT: Per primary. On Heparin drip bridging to coumadin. Dr. Acie Fredrickson in [place] coumadin clinic will manage INR as OP. INR 1.58 today. Hopefully DC soon. INR 1.58 today 8. H/O COPD: Per primary.  9. DM: per primary. S/S insulin. BS controlled.   Stefanie H. Brown NP-C 04/16/2015, 9:26 AM  [name] [phone]  Patient seen and examined, agree with above note with above modifications. Seen on HD- no c/o's- to continue fortaz at OP HD end date to be determined- awaiting INR at goal for discharge  Stefanie Parish, MD 04/16/2015     Subjective:  "I'm OK". Seen on HD, lying flat without WOB. Denies pain/discomfort at present.   Objective Filed Vitals:   04/16/15 0700 04/16/15 0800 04/16/15 0830 04/16/15 0900  BP: 156/68 148/67 132/61 156/70  Pulse: 53 57 56 61  Temp:      TempSrc:      Resp: 14 17 18 17   Height:      Weight:      SpO2:       Physical Exam General: elderly chronically ill appearing female in NAD- looks good  Heart: S1, S2, No M/G/R. SR on monitor, rate 60s.  Lungs: Bilateral breath sounds CTA A/P. No WOB.  Abdomen: soft nontender with active BS Extremities: No LE edema. Healing cellulitis LLE Dialysis Access: L thigh AVG + thrill. + bruit  Dialysis Orders: [place] TTS 3 hrs 30 min, 180NRe Optiflux, BFR 450, DFR Autoflow 1.5, EDW 73 (kg),  Dialysate 2.0 K, 2.0 Ca, UFR Profile: Profile 4, Sodium Model: Linear, Access: L thigh AV Graft  Heparin: 4000 units per treatment Calcitriol: 1.0 mcg PO q TTS (last dose 04/06/15) Mircera: 75 mcg IV q 2 weeks (last dose 04/02/15)   Additional Objective Labs: Basic Metabolic Panel:  Recent Labs Lab 04/11/15 0529 04/16/15 0640  NA 139 135  K 5.0 4.5  CL 100* 97*  CO2 25 24  GLUCOSE 117* 94  BUN 97* 44*  CREATININE 11.20* 7.89*  CALCIUM 8.8* 8.0*  PHOS  --  4.6   Liver Function Tests:  Recent Labs Lab 04/11/15 0529 04/16/15 0640  AST 50*  --   ALT 97*  --   ALKPHOS 61  --   BILITOT 0.9  --   PROT 5.4*  --   ALBUMIN 2.7* 2.7*   No results for input(s): LIPASE, AMYLASE in the last 168 hours. CBC:  Recent Labs Lab 04/12/15 0605 04/13/15 0428 04/14/15 0530 04/15/15 0605 04/16/15 0640  WBC 4.2 4.6 4.3 3.5* 4.1  HGB 9.3* 10.1* 10.1* 9.4* 9.4*  HCT 28.1* 30.6* 31.6* 28.8* 29.4*  MCV 97.9 97.5 97.8 98.6 95.8  PLT 70* 75* 101* 86* 107*   Blood Culture    Component Value Date/Time   SDES BLOOD RIGHT ANTECUBITAL 04/09/2015 1425   SPECREQUEST IN PEDIATRIC BOTTLE 2CC 04/09/2015 [place] 04/09/2015  1425   REPTSTATUS 04/12/2015 FINAL 04/09/2015 1425    Cardiac Enzymes: No results for input(s): CKTOTAL, CKMB, CKMBINDEX, TROPONINI in the last 168 hours. CBG:  Recent Labs Lab 04/14/15 2046 04/15/15 0742 04/15/15 1232 04/15/15 1646 04/15/15 2040  GLUCAP 191* 136* 106* 161* 214*   Iron Studies: No results for input(s): IRON, TIBC, TRANSFERRIN, FERRITIN in the last 72 hours. @lablastinr3 @ Studies/Results: No results found. Medications: . heparin 1,250 Units/hr (04/16/15 0230)   . calcitRIOL  1 mcg Oral Q T,Th,Sa-HD  . calcium acetate  2,001 mg Oral BID WC  . carvedilol  3.125 mg Oral BID WC  . cefTAZidime (FORTAZ)  IV  2 g Intravenous Q T,Th,Sat-1800  . cinacalcet  90 mg Oral Q breakfast  . Darbepoetin Alfa      . darbepoetin  (ARANESP) injection - DIALYSIS  60 mcg Intravenous Q Tue-HD  . gabapentin  100 mg Oral QHS  . insulin aspart  0-15 Units Subcutaneous TID WC  . insulin aspart  0-5 Units Subcutaneous QHS  . insulin glargine  10 Units Subcutaneous QHS  . latanoprost  1 drop Left Eye QHS  . lisinopril  30 mg Oral QHS  . multivitamin  1 tablet Oral QHS  . pravastatin  40 mg Oral q1800  . rOPINIRole  0.5 mg Oral QHS  . saccharomyces boulardii  250 mg Oral BID  . warfarin  10 mg Oral ONCE-1800  . warfarin   Does not apply Once  . Warfarin - Pharmacist Dosing Inpatient   Does not apply [phone]

## 2015-04-16 NOTE — Progress Notes (Signed)
ANTICOAGULATION CONSULT NOTE - Follow Up Consult  Pharmacy Consult for heparin/warfarin Indication: DVT  No Known Allergies  Patient Measurements: Height: 5\' 4"  (162.6 cm) Weight: 170 lb 3.1 oz (77.2 kg) IBW/kg (Calculated) : 54.7 Heparin Dosing Weight: 70.6 KG  Labs:  Recent Labs  04/14/15 0530 04/15/15 0605 04/16/15 0640 04/16/15 0720  HGB 10.1* 9.4* 9.4*  --   HCT 31.6* 28.8* 29.4*  --   PLT 101* 86* 107*  --   APTT 86*  --   --   --   LABPROT 14.9 17.7*  --  18.9*  INR 1.15 1.45  --  1.58*  HEPARINUNFRC 0.42 0.29*  --  0.57    Estimated Creatinine Clearance: 4.8 mL/min (by C-G formula based on Cr of 11.2).  Assessment: Patient on warfarin previously for recent LLE DVT then switched to apixaban. Patient is ESRD patient (not well-studied in this population). As a result, patient was converted to IV heparin as a bridge with warfarin 1/27.  Patient currently with therapeutic HL (0.57) on 1250 units/hr heparin. INR remains subtherapeutic, 1.45>>1.58. Hg low stable 9.4, plt low 106 (increased). No bleed issues documented.  Goal of Therapy:  HL 0.3-0.7 INR 2-3 Monitor platelets by anticoagulation protocol: Yes   Plan:  Continue heparin at 1250 units/hr Coumadin 10 mg PO x1 Daily HL, INR, CBC Monitor for s/sx of bleeding  Elicia Lamp, PharmD, BCPS Clinical Pharmacist Pager [phone] 04/16/2015 8:25 AM

## 2015-04-17 LAB — GLUCOSE, CAPILLARY
GLUCOSE-CAPILLARY: 76 mg/dL (ref 65–99)
GLUCOSE-CAPILLARY: 168 mg/dL — AB (ref 65–99)
GLUCOSE-CAPILLARY: 233 mg/dL — AB (ref 65–99)
Glucose-Capillary: 122 mg/dL — ABNORMAL HIGH (ref 65–99)

## 2015-04-17 LAB — CBC
HEMATOCRIT: 31.4 % — AB (ref 36.0–46.0)
HEMOGLOBIN: 10.1 g/dL — AB (ref 12.0–15.0)
MCH: 31.8 pg (ref 26.0–34.0)
MCHC: 32.2 g/dL (ref 30.0–36.0)
MCV: 98.7 fL (ref 78.0–100.0)
Platelets: 116 10*3/uL — ABNORMAL LOW (ref 150–400)
RBC: 3.18 MIL/uL — AB (ref 3.87–5.11)
RDW: 17.4 % — ABNORMAL HIGH (ref 11.5–15.5)
WBC: 4.8 10*3/uL (ref 4.0–10.5)

## 2015-04-17 LAB — PROTIME-INR
INR: 1.76 — ABNORMAL HIGH (ref 0.00–1.49)
Prothrombin Time: 20.5 seconds — ABNORMAL HIGH (ref 11.6–15.2)

## 2015-04-17 LAB — HEPARIN LEVEL (UNFRACTIONATED): HEPARIN UNFRACTIONATED: 0.6 [IU]/mL (ref 0.30–0.70)

## 2015-04-17 MED ORDER — LIDOCAINE HCL (PF) 1 % IJ SOLN: 5.0000 mL | INTRAMUSCULAR | Status: DC | PRN

## 2015-04-17 MED ORDER — PENTAFLUOROPROP-TETRAFLUOROETH EX AERO: 1.0000 "application " | INHALATION_SPRAY | CUTANEOUS | Status: DC | PRN

## 2015-04-17 MED ORDER — WARFARIN SODIUM 10 MG PO TABS
10.0000 mg | ORAL_TABLET | Freq: Once | ORAL | Status: AC
Start: 2015-04-17 — End: 2015-04-17
  Administered 2015-04-17: 10 mg via ORAL
  Filled 2015-04-17: qty 1

## 2015-04-17 MED ORDER — LIDOCAINE-PRILOCAINE 2.5-2.5 % EX CREA: 1.0000 "application " | TOPICAL_CREAM | CUTANEOUS | Status: DC | PRN

## 2015-04-17 MED ORDER — SODIUM CHLORIDE 0.9 % IV SOLN: 100.0000 mL | INTRAVENOUS | Status: DC | PRN

## 2015-04-17 MED ORDER — HEPARIN SODIUM (PORCINE) 1000 UNIT/ML DIALYSIS: 1000.0000 [IU] | INTRAMUSCULAR | Status: DC | PRN

## 2015-04-17 MED ORDER — ALTEPLASE 2 MG IJ SOLR
2.0000 mg | Freq: Once | INTRAMUSCULAR | Status: DC | PRN
  Filled 2015-04-17: qty 2

## 2015-04-17 NOTE — Progress Notes (Signed)
PROGRESS NOTE    Stefanie [name] [id number] DOB: 1945/06/10 DOA: 04/09/2015 PCP: Bonnita Nasuti, MD  HPI/Brief narrative 70 year old female patient with history of ESRD on TTS HD, DM, CVA, HTN, DVT LAD, presented from dialysis Center in [place] to [facility] ED on 04/09/15 with complaints of left leg pain. Vascular surgery evaluated ED and did not feel vascular insufficiency was etiology. Admitted for cellulitis of left leg and bacteremia. Nephrology following. Patient clinically improved. Eliquis discontinued and patient started on warfarin anticoagulation and IV heparin bridging. Left lower extremity cellulitis resolved. Completes IV antibiotics on 04/18/15. DC to SNF when INR therapeutic >2.   Assessment/Plan:   1. Left leg cellulitis: h/o PAD status post stenting to left common iliac vein and left common femoral vein. Patient evaluated by vascular surgery on 1/24 and pain felt not likely from vascular insufficiency. Venous Doppler negative for acute DVT. Started empirically on IV vancomycin and Zosyn. Nephrology discussed with infectious disease who recommended transitioning to IV Fortaz across HD and treat for total 7-10 days. Completes IV antibiotics after 04/18/15 doses. Cellulitis resolved. 2. Serratia bacteremia: One of 2 blood cultures positive. Initially started on vancomycin and Zosyn. Switched to IV [place] across HD as per ID recommendations and will complete antibiotics after 04/18/15 doses. 3. ESRD on TTS HD: Nephrology consulted and management per nephrology. 4. Chronic systolic CHF: Echo November 2016: LVEF 35% and grade 2 diastolic dysfunction. Compensated clinically but dry weight up by 7-8 kilos per nephrology who planned to Max UF with HD. Continue Coreg & lisinopril. 5. History of DVT left leg: Was on Coumadin initially when DVT first diagnosed in November 2016. At some point was seen at [facility] ED and started on Apixaban. Nephrology has weighed in and indicate that Apixaban is not  well studied in ESRD patients and hence was discontinued and patient was started on Coumadin and IV heparin bridge. INR on 2/1:1.76. Dr. Acie Fredrickson in [place] Coumadin clinic will manage INR as OP >this can be coordinated by nephrology service with blood draws across dialysis.Minimal bleeding L thigh AVG site-monitor closely while being anticoagulated 6. Essential hypertension: Controlled. Continue carvedilol and lisinopril. 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 Fluctuating but mostly controlled. 8. 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 Thrombocytopenia? Related to bacteremia or antibiotics. Hemoglobin stable. Platelet count improving. Follow CBCs. 9. History of CVA with residual aphasia  DVT prophylaxis: Apixaban > change to Coumadin & IV heparin bridge, per pharmacy. Code Status: Full Family Communication: Discussed with daughter at length on 04/17/15. Disposition Plan: DC to SNF when therapeutic INR, possibly in the next 1-2 days.   Consultants:  Vascular surgery  Nephrology  Procedures:  Hemodialysis  Antimicrobials:  Zosyn 1/24 > discontinued  Vancomycin 1/24 > discontinue  IV Fortaz 1/27 > 04/18/15  Subjective: Denies complaints. Minimal bleeding at left thigh AVG site.  Objective: Filed Vitals:   04/16/15 1747 04/16/15 2100 04/17/15 0431 04/17/15 0920  BP: 167/56 147/52 139/50 156/56  Pulse: 67 62 58 56  Temp: 97.5 F (36.4 C) 97.8 F (36.6 C) 98.6 F (37 C) 97.8 F (36.6 C)  TempSrc: Oral Oral Oral Oral  Resp: 18  19 20   Height:      Weight:  74.798 kg (164 lb 14.4 oz)    SpO2: 100% 100% 99% 100%    Intake/Output Summary (Last 24 hours) at 04/17/15 1347 Last data filed at 04/17/15 0859  Gross per 24 hour  Intake 887.51 ml  Output      0 ml  Net 887.51 ml   Filed Weights   04/16/15 [id number] 04/16/15 1012 04/16/15 2100  Weight: 77.2 kg (170 lb 3.1 oz) 74.2 kg (163 lb 9.3 oz) 74.798 kg (164 lb 14.4  oz)    Exam:  General exam: Pleasant middle-aged female sitting up comfortably in chair this morning. Respiratory system: Clear. No increased work of breathing. Cardiovascular system: S1 & S2 heard, RRR. No JVD, murmurs, gallops, clicks or pedal edema. Gastrointestinal system: Abdomen is nondistended, soft and nontender. Normal bowel sounds heard. Central nervous system: Alert and oriented. No focal neurological deficits. Extremities: Symmetric 5 x 5 power. Left leg slightly warm but no other acute findings. Probably back to baseline. Skin is hyperpigmented. Palpable left femoral AVG thrill. Nonfunctional left upper arm AVF. Minimal blood-tinged dressing left groin AVG site.   Data Reviewed: Basic Metabolic Panel:  Recent Labs Lab 04/11/15 0529 04/16/15 0640  NA 139 135  K 5.0 4.5  CL 100* 97*  CO2 25 24  GLUCOSE 117* 94  BUN 97* 44*  CREATININE 11.20* 7.89*  CALCIUM 8.8* 8.0*  PHOS  --  4.6   Liver Function Tests:  Recent Labs Lab 04/11/15 0529 04/16/15 0640  AST 50*  --   ALT 97*  --   ALKPHOS 61  --   BILITOT 0.9  --   PROT 5.4*  --   ALBUMIN 2.7* 2.7*   No results for input(s): LIPASE, AMYLASE in the last 168 hours. No results for input(s): AMMONIA in the last 168 hours. CBC:  Recent Labs Lab 04/14/15 0530 04/15/15 0605 04/16/15 0640 04/16/15 1121 04/17/15 0548  WBC 4.3 3.5* 4.1 4.2 4.8  HGB 10.1* 9.4* 9.4* 10.8* 10.1*  HCT 31.6* 28.8* 29.4* 33.4* 31.4*  MCV 97.8 98.6 95.8 96.3 98.7  PLT 101* 86* 107* 107* 116*   Cardiac Enzymes: No results for input(s): CKTOTAL, CKMB, CKMBINDEX, TROPONINI in the last 168 hours. BNP (last 3 results) No results for input(s): PROBNP in the last 8760 hours. CBG:  Recent Labs Lab 04/16/15 1145 04/16/15 1607 04/16/15 2107 04/17/15 0747 04/17/15 1210  GLUCAP 100* 152* 234* 76 122*    Recent Results (from the past 240 hour(s))  Blood culture (routine x 2)     Status: None   Collection Time: 04/09/15  2:09 PM    Result Value Ref Range Status   Specimen Description BLOOD RIGHT HAND  Final   Special Requests BOTTLES DRAWN AEROBIC ONLY 3CC  Final   Culture NO GROWTH 5 DAYS  Final   Report Status 04/14/2015 FINAL  Final  Blood culture (routine x 2)     Status: None   Collection Time: 04/09/15  2:25 PM  Result Value Ref Range Status   Specimen Description BLOOD RIGHT ANTECUBITAL  Final   Special Requests IN PEDIATRIC BOTTLE 2CC  Final   Culture  Setup Time   Final    GRAM NEGATIVE RODS IN PEDIATRIC BOTTLE CRITICAL RESULT CALLED TO, READ BACK BY AND VERIFIED WITH: CWICKER,RN @0554  04/10/15 MKELLY    Culture SERRATIA MARCESCENS  Final   Report Status 04/12/2015 FINAL  Final   Organism ID, Bacteria SERRATIA MARCESCENS  Final      Susceptibility   Serratia marcescens - MIC*    CEFAZOLIN >=64 RESISTANT Resistant     CEFEPIME <=1 SENSITIVE Sensitive     CEFTAZIDIME <=1 SENSITIVE Sensitive     CEFTRIAXONE <=1 SENSITIVE Sensitive     CIPROFLOXACIN <=0.25 SENSITIVE Sensitive  GENTAMICIN <=1 SENSITIVE Sensitive     TRIMETH/SULFA <=20 SENSITIVE Sensitive     * SERRATIA MARCESCENS  MRSA PCR Screening     Status: None   Collection Time: 04/10/15  6:22 AM  Result Value Ref Range Status   MRSA by PCR NEGATIVE NEGATIVE Final    Comment:        The GeneXpert MRSA Assay (FDA approved for NASAL specimens only), is one component of a comprehensive MRSA colonization surveillance program. It is not intended to diagnose MRSA infection nor to guide or monitor treatment for MRSA infections.          Studies: No results found.      Scheduled Meds: . calcitRIOL  1 mcg Oral Q T,Th,Sa-HD  . calcium acetate  2,001 mg Oral BID WC  . carvedilol  3.125 mg Oral BID WC  . cefTAZidime (FORTAZ)  IV  2 g Intravenous Q T,Th,Sat-1800  . cinacalcet  90 mg Oral Q breakfast  . darbepoetin (ARANESP) injection - DIALYSIS  60 mcg Intravenous Q Tue-HD  . gabapentin  100 mg Oral QHS  . insulin aspart  0-15  Units Subcutaneous TID WC  . insulin aspart  0-5 Units Subcutaneous QHS  . insulin glargine  10 Units Subcutaneous QHS  . latanoprost  1 drop Left Eye QHS  . lisinopril  30 mg Oral QHS  . multivitamin  1 tablet Oral QHS  . pravastatin  40 mg Oral q1800  . rOPINIRole  0.5 mg Oral QHS  . saccharomyces boulardii  250 mg Oral BID  . warfarin  10 mg Oral ONCE-1800  . warfarin   Does not apply Once  . Warfarin - Pharmacist Dosing Inpatient   Does not apply q1800   Continuous Infusions: . heparin 1,250 Units/hr (04/16/15 2258)    Principal Problem:   Cellulitis Active Problems:   Essential hypertension   ESRD on dialysis ([place])   PAD (peripheral artery disease) (HCC)   Leukocytosis   Chronic diastolic congestive heart failure (HCC)   History of CVA with residual deficit   Bacteremia   Lactic acidosis    Time spent: 15 minutes.    Vernell Leep, MD, FACP, FHM. [facility] Pager [phone]  If 7PM-7AM, please contact night-coverage [email or web address] Password [email or web address] 04/17/2015, 1:47 PM    LOS: 8 days

## 2015-04-17 NOTE — Progress Notes (Signed)
[facility] Progress Note  Assessment/Plan: 1. LLE cellulitis w serratia bacteremia - Per primary. Serratia Marcescens in 1 of 2 BCs. Rx 10 days IV abx / fortaz w HD- will need end date at discharge. Afebrile. Per primary. Cellulitis resolving.  2. ESRD -TTS at [facility]. HD 04/16/15. Will have HD here if still at [facility].  K+4.5 04/16/15.  3. Anemia - HGB 10.1. On ESA-rec'd 04/16/15. Follow HGB 4. Secondary hyperparathyroidism - Ca 8.0 C Ca 9.04 Continue binders, sensipar and calcitriol as ordered. phos 4.6 5. HTN/volume -HD 04/16/15. Net UF 3000 Post wt 74.2. Wt today 74.7. Still above OP EDW. BP slightly elevated. Will cont to lower volume tomorrow attempt UFG 4 liters.  6. Nutrition - Albumin 2.7. Renal diet with Fld restrictions 7. LLE DVT: Per primary. On Heparin drip bridging to coumadin. Dr. Acie Fredrickson in [place] coumadin clinic will manage INR as OP. INR 1.76  today.  8. H/O COPD: Per primary.  9. DM: per primary. S/S insulin. BS controlled.   Disposition: INR now 1.76. Hopefully DC home soon. Stable from renal prospective.    [name] NP-C 04/17/2015, 10:00 AM  [facility] [phone]  Patient seen and examined, agree with above note with above modifications. Doing fine- had a little oozing from her leg graft=- dressing reinforced- will follow - INR not quite there yet- hopefully can go tomorrow after HD [name], MD 04/17/2015     Subjective:  "I'm getting better". Up in chair, no complaints  Objective Filed Vitals:   04/16/15 1107 04/16/15 1747 04/16/15 2100 04/17/15 0431  BP: 169/50 167/56 147/52 139/50  Pulse: 66 67 62 58  Temp: 97.8 F (36.6 C) 97.5 F (36.4 C) 97.8 F (36.6 C) 98.6 F (37 C)  TempSrc: Oral Oral Oral Oral  Resp: 16 18  19   Height:      Weight:   74.798 kg (164 lb 14.4 oz)   SpO2: 99% 100% 100% 99%   Physical Exam General: elderly chronically ill appearing female in NAD- looks good   Heart: S1, S2, No M/G/R. SR on monitor, rate 60s.  Lungs: Bilateral breath sounds CTA A/P. No WOB.  Abdomen: soft nontender with active BS Extremities: LLE cellulitis resolving. Trace BLE edema-pretib Dialysis Access: L thigh AVG + thrill. + bruit  Dialysis Orders: [facility] TTS 3 hrs 30 min, 180NRe Optiflux, BFR 450, DFR Autoflow 1.5, EDW 73 (kg), Dialysate 2.0 K, 2.0 Ca, UFR Profile: Profile 4, Sodium Model: Linear, Access: L thigh AV Graft  Heparin: 4000 units per treatment Calcitriol: 1.0 mcg PO q TTS (last dose 04/06/15) Mircera: 75 mcg IV q 2 weeks (last dose 04/02/15)  Additional Objective Labs: Basic Metabolic Panel:  Recent Labs Lab 04/11/15 0529 04/16/15 0640  NA 139 135  K 5.0 4.5  CL 100* 97*  CO2 25 24  GLUCOSE 117* 94  BUN 97* 44*  CREATININE 11.20* 7.89*  CALCIUM 8.8* 8.0*  PHOS  --  4.6   Liver Function Tests:  Recent Labs Lab 04/11/15 0529 04/16/15 0640  AST 50*  --   ALT 97*  --   ALKPHOS 61  --   BILITOT 0.9  --   PROT 5.4*  --   ALBUMIN 2.7* 2.7*   No results for input(s): LIPASE, AMYLASE in the last 168 hours. CBC:  Recent Labs Lab 04/14/15 0530 04/15/15 0605 04/16/15 0640 04/16/15 1121 04/17/15 0548  WBC 4.3 3.5* 4.1 4.2 4.8  HGB 10.1* 9.4* 9.4* 10.8* 10.1*  HCT 31.6* 28.8* 29.4* 33.4* 31.4*  MCV 97.8 98.6 95.8 96.3 98.7  PLT 101* 86* 107* 107* 116*   Blood Culture    Component Value Date/Time   SDES BLOOD RIGHT ANTECUBITAL 04/09/2015 1425   SPECREQUEST IN PEDIATRIC BOTTLE 2CC 04/09/2015 1425   CULT SERRATIA MARCESCENS 04/09/2015 1425   REPTSTATUS 04/12/2015 FINAL 04/09/2015 1425    Cardiac Enzymes: No results for input(s): CKTOTAL, CKMB, CKMBINDEX, TROPONINI in the last 168 hours. CBG:  Recent Labs Lab 04/16/15 1103 04/16/15 1145 04/16/15 1607 04/16/15 2107 04/17/15 0747  GLUCAP 80 100* 152* 234* 76   Iron Studies: No results for input(s): IRON, TIBC, TRANSFERRIN, FERRITIN in the last 72  hours. @lablastinr3 @ Studies/Results: No results found. Medications: . heparin 1,250 Units/hr (04/16/15 2258)   . calcitRIOL  1 mcg Oral Q T,Th,Sa-HD  . calcium acetate  2,001 mg Oral BID WC  . carvedilol  3.125 mg Oral BID WC  . cefTAZidime (FORTAZ)  IV  2 g Intravenous Q T,Th,Sat-1800  . cinacalcet  90 mg Oral Q breakfast  . darbepoetin (ARANESP) injection - DIALYSIS  60 mcg Intravenous Q Tue-HD  . gabapentin  100 mg Oral QHS  . insulin aspart  0-15 Units Subcutaneous TID WC  . insulin aspart  0-5 Units Subcutaneous QHS  . insulin glargine  10 Units Subcutaneous QHS  . latanoprost  1 drop Left Eye QHS  . lisinopril  30 mg Oral QHS  . multivitamin  1 tablet Oral QHS  . pravastatin  40 mg Oral q1800  . rOPINIRole  0.5 mg Oral QHS  . saccharomyces boulardii  250 mg Oral BID  . warfarin  10 mg Oral ONCE-1800  . warfarin   Does not apply Once  . Warfarin - Pharmacist Dosing Inpatient   Does not apply [phone]

## 2015-04-17 NOTE — Progress Notes (Signed)
ANTICOAGULATION CONSULT NOTE - Follow Up Consult  Pharmacy Consult for heparin/warfarin Indication: DVT  No Known Allergies  Patient Measurements: Height: 5\' 4"  (162.6 cm) Weight: 164 lb 14.4 oz (74.798 kg) IBW/kg (Calculated) : 54.7 Heparin Dosing Weight: 70.6 KG  Labs:  Recent Labs  04/15/15 0605 04/16/15 0640 04/16/15 0720 04/16/15 1121 04/17/15 0548  HGB 9.4* 9.4*  --  10.8* 10.1*  HCT 28.8* 29.4*  --  33.4* 31.4*  PLT 86* 107*  --  107* 116*  LABPROT 17.7*  --  18.9*  --  20.5*  INR 1.45  --  1.58*  --  1.76*  HEPARINUNFRC 0.29*  --  0.57  --  0.60  CREATININE  --  7.89*  --   --   --     Estimated Creatinine Clearance: 6.7 mL/min (by C-G formula based on Cr of 7.89).  Assessment: Patient on warfarin previously for recent LLE DVT then switched to apixaban. Patient is ESRD patient (not well-studied in this population). As a result, patient was converted to IV heparin as a bridge with warfarin 1/27.  Patient currently with therapeutic HL (0.6) on 1250 units/h. INR improving 1.58>>1.76. Some oozing from AVG 2/1. No further bleed/IV line issues noted. Hg low stable 10.1, plt low 116 (increased). No bleed issues documented.  Goal of Therapy:  HL 0.3-0.7 INR 2-3 Monitor platelets by anticoagulation protocol: Yes   Plan:  Continue heparin at 1250 units/hr Coumadin 10 mg PO x1 Daily HL, INR, CBC Monitor for s/sx of bleeding  Elicia Lamp, PharmD, [facility] Clinical Pharmacist Pager [phone] 04/17/2015 8:24 AM

## 2015-04-17 NOTE — Progress Notes (Signed)
Notified by patient's RN that patient's left thigh AVG is oozing blood. Patient is currently on a heparin drip. Dry dressing reinforced. Instructed patient to inform RN if the blood starts to show through dressing. Dr. Moshe Cipro notified and is aware. Will continue to monitor.  Joellen Jersey, RN.

## 2015-04-17 NOTE — Progress Notes (Signed)
Pharmacy Antibiotic Follow-up Note  Stefanie [name] is a 70 y.o. year-old female admitted on 04/09/2015.  The patient is currently on day 9 of 10 of ceftaz for LLE cellultis/ GNR Bacteremia (Serratia marcescens) .  Assessment: Has resolving cellulitis complicating a swollen left leg with hx of acute DVT in Nov 2016 in the same leg. Blood cultures on 04/09/15,1 of 2 cultures grew Serratia (sensitive to Ceftazidime, cefepime and cipro). Vanc/Zosyn was changed to [place] on 1/27 per Dr. Jonnie Finner with a stop date of 2/2 to complete 10 days.  Plan: Ceftaz 2g IV qHD-TTS Monitor clinical progress, c/s Stop date 2/2  Temp (24hrs), Avg:97.8 F (36.6 C), Min:97.5 F (36.4 C), Max:98.6 F (37 C)   Recent Labs Lab 04/14/15 0530 04/15/15 0605 04/16/15 0640 04/16/15 1121 04/17/15 0548  WBC 4.3 3.5* 4.1 4.2 4.8     Recent Labs Lab 04/11/15 0529 04/16/15 0640  CREATININE 11.20* 7.89*   Estimated Creatinine Clearance: 6.7 mL/min (by C-G formula based on Cr of 7.89).    No Known Allergies  Antimicrobials this admission: Vanc 1/24>> 1/27 (last dose given 1/26) Zosyn 1/24 >>1/27 Ceftaz 1/27 >> (2/2)  Microbiology results: 1/24 BCx X2 1of 2 GNR Serratia marcescens (Sensitive to ceftazidime, cefepime,cipro; Resistant to Cefazolin) 1/24 MRSA PCR: negative  Elicia Lamp, PharmD, [facility] Clinical Pharmacist Pager [phone] 04/17/2015 8:38 AM

## 2015-04-18 DIAGNOSIS — I10 Essential (primary) hypertension: Secondary | ICD-10-CM

## 2015-04-18 DIAGNOSIS — L0390 Cellulitis, unspecified: Secondary | ICD-10-CM

## 2015-04-18 LAB — RENAL FUNCTION PANEL
ALBUMIN: 2.9 g/dL — AB (ref 3.5–5.0)
ANION GAP: 12 (ref 5–15)
BUN: 34 mg/dL — ABNORMAL HIGH (ref 6–20)
CALCIUM: 9 mg/dL (ref 8.9–10.3)
CO2: 27 mmol/L (ref 22–32)
CREATININE: 6.99 mg/dL — AB (ref 0.44–1.00)
Chloride: 94 mmol/L — ABNORMAL LOW (ref 101–111)
GFR, EST AFRICAN AMERICAN: 6 mL/min — AB (ref >60)
GFR, EST NON AFRICAN AMERICAN: 5 mL/min — AB (ref >60)
Glucose, Bld: 134 mg/dL — ABNORMAL HIGH (ref 65–99)
PHOSPHORUS: 4.7 mg/dL — AB (ref 2.5–4.6)
Potassium: 4.4 mmol/L (ref 3.5–5.1)
SODIUM: 133 mmol/L — AB (ref 135–145)

## 2015-04-18 LAB — HEPARIN LEVEL (UNFRACTIONATED)
HEPARIN UNFRACTIONATED: 0.51 [IU]/mL (ref 0.30–0.70)
Heparin Unfractionated: 0.24 IU/mL — ABNORMAL LOW (ref 0.30–0.70)

## 2015-04-18 LAB — CBC
HCT: 30.6 % — ABNORMAL LOW (ref 36.0–46.0)
HEMOGLOBIN: 9.9 g/dL — AB (ref 12.0–15.0)
MCH: 31.8 pg (ref 26.0–34.0)
MCHC: 32.4 g/dL (ref 30.0–36.0)
MCV: 98.4 fL (ref 78.0–100.0)
PLATELETS: 116 10*3/uL — AB (ref 150–400)
RBC: 3.11 MIL/uL — AB (ref 3.87–5.11)
RDW: 17.6 % — ABNORMAL HIGH (ref 11.5–15.5)
WBC: 3.9 10*3/uL — AB (ref 4.0–10.5)

## 2015-04-18 LAB — GLUCOSE, CAPILLARY
GLUCOSE-CAPILLARY: 155 mg/dL — AB (ref 65–99)
GLUCOSE-CAPILLARY: 59 mg/dL — AB (ref 65–99)
GLUCOSE-CAPILLARY: 200 mg/dL — AB (ref 65–99)
Glucose-Capillary: 72 mg/dL (ref 65–99)
Glucose-Capillary: 192 mg/dL — ABNORMAL HIGH (ref 65–99)

## 2015-04-18 LAB — PROTIME-INR
INR: 2.18 — ABNORMAL HIGH (ref 0.00–1.49)
Prothrombin Time: 24.1 seconds — ABNORMAL HIGH (ref 11.6–15.2)

## 2015-04-18 MED ORDER — HYDRALAZINE HCL 10 MG PO TABS: 10.0000 mg | ORAL_TABLET | Freq: Three times a day (TID) | ORAL | Status: DC

## 2015-04-18 MED ORDER — WARFARIN SODIUM 7.5 MG PO TABS
7.5000 mg | ORAL_TABLET | Freq: Once | ORAL | Status: AC
  Administered 2015-04-18: 7.5 mg via ORAL
  Filled 2015-04-18: qty 1

## 2015-04-18 NOTE — Clinical Social Work Note (Addendum)
CSW talked with patient's daughter Aliene Altes) by phone and she is requesting that her mother d/c to [facility] when ready. CSW advised daughter that follow-up will be made with her preferred SNF on Friday.   Glendene Wyer Givens, MSW, LCSW Licensed Clinical Social Worker [place] [phone]

## 2015-04-18 NOTE — Progress Notes (Signed)
ANTICOAGULATION CONSULT NOTE - Follow Up Consult  Pharmacy Consult for heparin >> warfarin Indication: DVT  No Known Allergies  Patient Measurements: Height: 5\' 4"  (162.6 cm) Weight: 158 lb 8.2 oz (71.9 kg) (standing scale) IBW/kg (Calculated) : 54.7 Heparin Dosing Weight: 70.6 KG  Labs:  Recent Labs  04/16/15 0640  04/16/15 0720 04/16/15 1121 04/17/15 0548 04/18/15 0636 04/18/15 1606  HGB 9.4*  --   --  10.8* 10.1* 9.9*  --   HCT 29.4*  --   --  33.4* 31.4* 30.6*  --   PLT 107*  --   --  107* 116* 116*  --   LABPROT  --   --  18.9*  --  20.5* 24.1*  --   INR  --   --  1.58*  --  1.76* 2.18*  --   HEPARINUNFRC  --   < > 0.57  --  0.60 0.24* 0.51  CREATININE 7.89*  --   --   --   --  6.99*  --   < > = values in this interval not displayed.  Estimated Creatinine Clearance: 7.4 mL/min (by C-G formula based on Cr of 6.99).  Assessment: Patient on warfarin previously for recent LLE DVT then switched to apixaban. Patient is ESRD patient (not well-studied in this population). As a result, patient was converted to IV heparin as a bridge with warfarin 1/27.  Heparin is therapeutic on 1300 units/hr.  Goal of Therapy:  HL 0.3-0.7 INR 2-3 Monitor platelets by anticoagulation protocol: Yes   Plan:  Continue heparin at 1300 units/hr  Follow-up with AM heparin level, CBC Monitor for s/sx of bleeding  [employer], Pharm.D., BCPS Clinical Pharmacist Pager [phone] 04/18/2015 5:56 PM

## 2015-04-18 NOTE — Progress Notes (Signed)
[facility] Progress Note  Assessment/Plan: 1. LLE cellulitis w serratia bacteremia - Per primary. Serratia Marcescens in 1 of 2 BCs. Rx 10 days IV abx / fortaz w HD  2. ESRD -TTS for HD today K 4.4; had bleeding from access yesterday- slight ooze- still BRB on dressing today but no active bleeding; last intervention was PTA of instent stenosis 11/23 by Dr. Oneida Alar; plan monitor treatment - has been on heparin drip; using no heparin today; repeat AF after discharge 3. Anemia - HGB 9.9. On ESA-rec'd 04/16/15. Follow HGB 4. Secondary hyperparathyroidism - Ca 9 Continue binders, sensipar and calcitriol  5. HTN/volume - Net UF 3000 Tuesday trying for more today as tolerated - she may not be able to tolerate entire goal of 4.5 Na low at 133 6. Nutrition - Albumin 2.7. Renal diet with Fld restrictions 7. LLE DVT:  INR therapeutic this am. Dr. Acie Fredrickson in [facility] coumadin clinic will manage INR  8. H/O COPD: Per primary.  9. DM: per primary. S/S insulin. BS controlled.  [place] d/c today   Stefanie Jacobson, PA-C [place] [phone] 04/18/2015,8:40 AM  LOS: 9 days    Patient seen and examined, agree with above note with above modifications. Seen in HD- INR therapeutic so hopefully home today - slight ooze from AVG but no further bleeding since I saw her yesterday - I more attiributed it to the heparin  Stefanie Parish, MD 04/18/2015    Subjective:   No c/o  Objective Filed Vitals:   04/17/15 1745 04/17/15 1813 04/17/15 2131 04/18/15 0515  BP: 148/52  104/61 141/56  Pulse: 67 78 61 56  Temp: 97.7 F (36.5 C)  97.9 F (36.6 C) 97.7 F (36.5 C)  TempSrc: Oral  Oral Oral  Resp: 18  18 18   Height:      Weight:      SpO2: 99%  100% 99%   Physical Exam General: NAD - looks good Heart: RRR Lungs: no rales Abdomen: soft NT Extremities: tr LLE edema LLE darker celllulitis improving Dialysis Access: left thigh AVGG + bruit - no active  bleeding pre HD  Dialysis Orders: 3 hrs 30 min, 180NRe Optiflux, BFR 450, DFR Autoflow 1.5, EDW 73 (kg), Dialysate 2.0 K, 2.0 Ca, UFR Profile: Profile 4, Sodium Model: Linear, Access: L thigh AV Graft  Heparin: 4000 units per treatment Calcitriol: 1.0 mcg PO q TTS (last dose 04/06/15) Mircera: 75 mcg IV q 2 weeks (last dose 04/02/15)   Additional Objective Labs: Lab Results  Component Value Date   INR 2.18* 04/18/2015   INR 1.76* 04/17/2015   INR 1.58* [id number]    Basic Metabolic Panel:  Recent Labs Lab 04/16/15 0640 04/18/15 0636  NA 135 133*  K 4.5 4.4  CL 97* 94*  CO2 24 27  GLUCOSE 94 134*  BUN 44* 34*  CREATININE 7.89* 6.99*  CALCIUM 8.0* 9.0  PHOS 4.6 4.7*   Liver Function Tests:  Recent Labs Lab 04/16/15 0640 04/18/15 0636  ALBUMIN 2.7* 2.9*   CBC:  Recent Labs Lab 04/15/15 0605 04/16/15 0640 04/16/15 1121 04/17/15 0548 04/18/15 0636  WBC 3.5* 4.1 4.2 4.8 3.9*  HGB 9.4* 9.4* 10.8* 10.1* 9.9*  HCT 28.8* 29.4* 33.4* 31.4* 30.6*  MCV 98.6 95.8 96.3 98.7 98.4  PLT 86* 107* 107* 116* 116*   Blood Culture    Component Value Date/Time   SDES BLOOD RIGHT ANTECUBITAL 04/09/2015 1425   SPECREQUEST IN PEDIATRIC BOTTLE [facility] 04/09/2015  [place] 04/09/2015 1425   REPTSTATUS 04/12/2015 FINAL 04/09/2015 1425   CBG:  Recent Labs Lab 04/17/15 0747 04/17/15 1210 04/17/15 1714 04/17/15 2131 04/18/15 0739  GLUCAP 76 122* 168* 233* 155*   Medications: . heparin 1,300 Units/hr (04/18/15 0834)   . calcitRIOL  1 mcg Oral Q T,Th,Sa-HD  . calcium acetate  2,001 mg Oral BID WC  . carvedilol  3.125 mg Oral BID WC  . cefTAZidime (FORTAZ)  IV  2 g Intravenous Q T,Th,Sat-1800  . cinacalcet  90 mg Oral Q breakfast  . darbepoetin (ARANESP) injection - DIALYSIS  60 mcg Intravenous Q Tue-HD  . gabapentin  100 mg Oral QHS  . insulin aspart  0-15 Units Subcutaneous TID WC  . insulin aspart  0-5 Units Subcutaneous QHS  . insulin  glargine  10 Units Subcutaneous QHS  . latanoprost  1 drop Left Eye QHS  . lisinopril  30 mg Oral QHS  . multivitamin  1 tablet Oral QHS  . pravastatin  40 mg Oral q1800  . rOPINIRole  0.5 mg Oral QHS  . saccharomyces boulardii  250 mg Oral BID  . warfarin  7.5 mg Oral ONCE-1800  . warfarin   Does not apply Once  . Warfarin - Pharmacist Dosing Inpatient   Does not apply [phone]

## 2015-04-18 NOTE — Progress Notes (Addendum)
ANTICOAGULATION CONSULT NOTE - Follow Up Consult  Pharmacy Consult for heparin/warfarin Indication: DVT  No Known Allergies  Patient Measurements: Height: 5\' 4"  (162.6 cm) Weight: 164 lb 14.4 oz (74.798 kg) IBW/kg (Calculated) : 54.7 Heparin Dosing Weight: 70.6 KG  Labs:  Recent Labs  04/16/15 0640 04/16/15 0720 04/16/15 1121 04/17/15 0548 04/18/15 0636  HGB 9.4*  --  10.8* 10.1* 9.9*  HCT 29.4*  --  33.4* 31.4* 30.6*  PLT 107*  --  107* 116* 116*  LABPROT  --  18.9*  --  20.5* 24.1*  INR  --  1.58*  --  1.76* 2.18*  HEPARINUNFRC  --  0.57  --  0.60 0.24*  CREATININE 7.89*  --   --   --  6.99*    Estimated Creatinine Clearance: 7.5 mL/min (by C-G formula based on Cr of 6.99).  Assessment: Patient on warfarin previously for recent LLE DVT then switched to apixaban. Patient is ESRD patient (not well-studied in this population). As a result, patient was converted to IV heparin as a bridge with warfarin 1/27.  Now following HLs - subtherapeutic (0.24) on 1250 units/h - previously therapeutic and RN not aware of drip being off but will increase conservatively. INR 1.76>>2.18. Some oozing from AVG 2/1. No further bleed/IV line issues noted per RN. Hgb low stable 9.9. Plt low stable 116.  Goal of Therapy:  HL 0.3-0.7 INR 2-3 Monitor platelets by anticoagulation protocol: Yes   Plan:  Increase heparin to 1300 units/hr - dc when INR therapeutic x2 Coumadin 7.5 mg PO x1 8h HL Daily HL, INR, CBC Monitor for s/sx of bleeding  Elicia Lamp, PharmD, BCPS Clinical Pharmacist Pager [phone] 04/18/2015 8:26 AM   ADDENDUM:  Pharmacy asked to recommend outpatient warfarin dose. Would recommend warfarin 7.5mg  daily for now and re-check INR in ~3-4 days. Recommendation communicated with MD.  Elicia Lamp, PharmD, BCPS Clinical Pharmacist Pager [phone] 04/18/2015 1:34 PM

## 2015-04-18 NOTE — Progress Notes (Signed)
PROGRESS NOTE    Stefanie [name] [id number] DOB: 07-11-1945 DOA: 04/09/2015 PCP: Bonnita Nasuti, MD  HPI/Brief narrative 70 year old female patient with history of ESRD on TTS HD, DM, CVA, HTN, DVT LAD, presented from dialysis Center in [place] to [facility] ED on 04/09/15 with complaints of left leg pain. Vascular surgery evaluated ED and did not feel vascular insufficiency was etiology. Admitted for cellulitis of left leg and bacteremia. Nephrology following. Patient clinically improved. Eliquis discontinued and patient started on warfarin anticoagulation and IV heparin bridging. Left lower extremity cellulitis resolved. Completes IV antibiotics on 04/18/15. DC to SNF when INR therapeutic >2.   Assessment/Plan:   1. Left leg cellulitis: h/o PAD status post stenting to left common iliac vein and left common femoral vein. Patient evaluated by vascular surgery on 1/24 and pain felt not likely from vascular insufficiency. Venous Doppler negative for acute DVT. Started empirically on IV vancomycin and Zosyn. Nephrology discussed with infectious disease who recommended transitioning to IV Fortaz across HD and treat for total 7-10 days. Completes IV antibiotics after 04/18/15 doses. Cellulitis resolved.  2. Serratia bacteremia: One of 2 blood cultures positive. Initially started on vancomycin and Zosyn. Switched to IV [place] across HD as per ID recommendations and will complete antibiotics after 04/18/15 doses.  3. ESRD on TTS HD: Nephrology consulted and management per nephrology. 4. Chronic systolic CHF: Echo November 2016: LVEF 35% and grade 2 diastolic dysfunction. Compensated clinically but dry weight up by 7-8 kilos per nephrology who planned to Max UF with HD. Continue Coreg & lisinopril. 5. History of DVT left leg: Was on Coumadin initially when DVT first diagnosed in November 2016. At some point was seen at [facility] ED and started on Apixaban. Nephrology has weighed in and indicate that Apixaban is not  well studied in ESRD patients and hence was discontinued and patient was started on Coumadin and IV heparin bridge. INR trending up Dr. Acie Fredrickson in [place] Coumadin clinic will manage INR as OP >this can be coordinated by nephrology service with blood draws across dialysis.Minimal bleeding L thigh AVG site-monitor closely while being anticoagulated 6. Essential hypertension: Not well controlled on last check. Continue carvedilol and lisinopril. Reassess next am. 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 Fluctuating but mostly controlled. 8. 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 Thrombocytopenia? Related to bacteremia or antibiotics. Hemoglobin stable. Platelet count improving. Follow CBCs. 9. History of CVA with residual aphasia  DVT prophylaxis: Therapeutic on coumadin Code Status: Full Family Communication: Discussed with daughter at length on 04/17/15. Disposition Plan: DC to SNF most likely next day 04/19/15   Consultants:  Vascular surgery  Nephrology  Procedures:  Hemodialysis  Antimicrobials:  Zosyn 1/24 > discontinued  Vancomycin 1/24 > discontinue  IV Fortaz 1/27 > 04/18/15  Subjective: Pt has no new complaints.   Objective: Filed Vitals:   04/18/15 1200 04/18/15 1230 04/18/15 1300 04/18/15 1442  BP: 125/53 127/56 130/53 171/59  Pulse: 52 56 51 66  Temp:   97 F (36.1 C) 97.8 F (36.6 C)  TempSrc:   Oral Oral  Resp:   20 20  Height:      Weight:   71.9 kg (158 lb 8.2 oz)   SpO2:   96% 100%    Intake/Output Summary (Last 24 hours) at 04/18/15 1708 Last data filed at 04/18/15 1300  Gross per 24 hour  Intake 1565.72 ml  Output   4007 ml  Net -2441.28 ml  Filed Weights   04/16/15 2100 04/18/15 0924 04/18/15 1300  Weight: 74.798 kg (164 lb 14.4 oz) 76.2 kg (167 lb 15.9 oz) 71.9 kg (158 lb 8.2 oz)    Exam:  General exam: alert and awake Respiratory system: CTA BL, no wheezes Cardiovascular  system: S1 & S2 heard, RRR. No JVD, murmurs, gallops, clicks or pedal edema. Gastrointestinal system: Abdomen is nondistended, soft and nontender. Normal bowel sounds heard. Central nervous system: Alert and oriented. No focal neurological deficits. Extremities: no cyanosis on limited exam.   Data Reviewed: Basic Metabolic Panel:  Recent Labs Lab 04/16/15 0640 04/18/15 0636  NA 135 133*  K 4.5 4.4  CL 97* 94*  CO2 24 27  GLUCOSE 94 134*  BUN 44* 34*  CREATININE 7.89* 6.99*  CALCIUM 8.0* 9.0  PHOS 4.6 4.7*   Liver Function Tests:  Recent Labs Lab 04/16/15 0640 04/18/15 0636  ALBUMIN 2.7* 2.9*   No results for input(s): LIPASE, AMYLASE in the last 168 hours. No results for input(s): AMMONIA in the last 168 hours. CBC:  Recent Labs Lab 04/15/15 0605 04/16/15 0640 04/16/15 1121 04/17/15 0548 04/18/15 0636  WBC 3.5* 4.1 4.2 4.8 3.9*  HGB 9.4* 9.4* 10.8* 10.1* 9.9*  HCT 28.8* 29.4* 33.4* 31.4* 30.6*  MCV 98.6 95.8 96.3 98.7 98.4  PLT 86* 107* 107* 116* 116*   Cardiac Enzymes: No results for input(s): CKTOTAL, CKMB, CKMBINDEX, TROPONINI in the last 168 hours. BNP (last 3 results) No results for input(s): PROBNP in the last 8760 hours. CBG:  Recent Labs Lab 04/17/15 1714 04/17/15 2131 04/18/15 0739 04/18/15 1441 04/18/15 1512  GLUCAP 168* 233* 155* 59* 72    Recent Results (from the past 240 hour(s))  Blood culture (routine x 2)     Status: None   Collection Time: 04/09/15  2:09 PM  Result Value Ref Range Status   Specimen Description BLOOD RIGHT HAND  Final   Special Requests BOTTLES DRAWN AEROBIC ONLY 3CC  Final   Culture NO GROWTH 5 DAYS  Final   Report Status 04/14/2015 FINAL  Final  Blood culture (routine x 2)     Status: None   Collection Time: 04/09/15  2:25 PM  Result Value Ref Range Status   Specimen Description BLOOD RIGHT ANTECUBITAL  Final   Special Requests IN PEDIATRIC BOTTLE 2CC  Final   Culture  Setup Time   Final    GRAM NEGATIVE  RODS IN PEDIATRIC BOTTLE CRITICAL RESULT CALLED TO, READ BACK BY AND VERIFIED WITH: CWICKER,RN @0554  04/10/15 MKELLY    Culture SERRATIA MARCESCENS  Final   Report Status 04/12/2015 FINAL  Final   Organism ID, Bacteria SERRATIA MARCESCENS  Final      Susceptibility   Serratia marcescens - MIC*    CEFAZOLIN >=64 RESISTANT Resistant     CEFEPIME <=1 SENSITIVE Sensitive     CEFTAZIDIME <=1 SENSITIVE Sensitive     CEFTRIAXONE <=1 SENSITIVE Sensitive     CIPROFLOXACIN <=0.25 SENSITIVE Sensitive     GENTAMICIN <=1 SENSITIVE Sensitive     TRIMETH/SULFA <=20 SENSITIVE Sensitive     * SERRATIA MARCESCENS  MRSA PCR Screening     Status: None   Collection Time: 04/10/15  6:22 AM  Result Value Ref Range Status   MRSA by PCR NEGATIVE NEGATIVE Final    Comment:        The GeneXpert MRSA Assay (FDA approved for NASAL specimens only), is one component of a comprehensive MRSA colonization surveillance program. It is not  intended to diagnose MRSA infection nor to guide or monitor treatment for MRSA infections.          Studies: No results found.      Scheduled Meds: . calcitRIOL  1 mcg Oral Q T,Th,Sa-HD  . calcium acetate  2,001 mg Oral BID WC  . carvedilol  3.125 mg Oral BID WC  . cinacalcet  90 mg Oral Q breakfast  . darbepoetin (ARANESP) injection - DIALYSIS  60 mcg Intravenous Q Tue-HD  . gabapentin  100 mg Oral QHS  . insulin aspart  0-15 Units Subcutaneous TID WC  . insulin aspart  0-5 Units Subcutaneous QHS  . insulin glargine  10 Units Subcutaneous QHS  . latanoprost  1 drop Left Eye QHS  . lisinopril  30 mg Oral QHS  . multivitamin  1 tablet Oral QHS  . pravastatin  40 mg Oral q1800  . rOPINIRole  0.5 mg Oral QHS  . saccharomyces boulardii  250 mg Oral BID  . warfarin   Does not apply Once  . Warfarin - Pharmacist Dosing Inpatient   Does not apply q1800   Continuous Infusions: . heparin 1,300 Units/hr (04/18/15 0834)    Principal Problem:    Cellulitis Active Problems:   Essential hypertension   ESRD on dialysis ([place])   PAD (peripheral artery disease) (HCC)   Leukocytosis   Chronic diastolic congestive heart failure (HCC)   History of CVA with residual deficit   Bacteremia   Lactic acidosis    Time spent: 15 minutes.    Velvet Bathe, MD, FACP, [facility]. [facility] Pager [phone]  If 7PM-7AM, please contact night-coverage [email or web address] Password [email or web address] 04/18/2015, 5:08 PM    LOS: 9 days

## 2015-04-18 NOTE — Procedures (Signed)
Patient was seen on dialysis and the procedure was supervised.  Just getting started  BP 141/56   Via AVG    Patient appears to be tolerating treatment well  Stefanie [name] A 04/18/2015

## 2015-04-18 NOTE — Progress Notes (Signed)
Physical Therapy Treatment Patient Details Name: Stefanie [name] MRN: [id number] DOB: Jul 25, 1945 Today's Date: 04/18/2015    History of Present Illness Stefanie S Baranowski is a 70 y.o. female with a Past Medical History of HTN, HLD, DM, CVA, protein calorie malnutrition, ESRD on dialysis, CHF, who presents with cellulitis, bacteremia, in need of dialysis    PT Comments    Pt eager to participate in PT this PM. Tolerated increased activity w/o dyspnea. Continues to display gait abnormalities and extremely slow gait speed however stable with RW support. Will continue to follow pt acutely before D/C to SNF to maximize recovery to Mod I for safe transition home.     Follow Up Recommendations  SNF     Equipment Recommendations  None recommended by PT    Recommendations for Other Services       Precautions / Restrictions Precautions Precautions: Fall Restrictions Weight Bearing Restrictions: No    Mobility  Bed Mobility               General bed mobility comments: received in chair/returned to chair  Transfers Overall transfer level: Needs assistance Equipment used: Rolling walker (2 wheeled) Transfers: Sit to/from Stand Sit to Stand: Min assist         General transfer comment: VCs to stand up straight initially, pt able to hold upright posture about ~20 seconds before reverting to flexed trunk, min A for initial boost  Ambulation/Gait Ambulation/Gait assistance: Min assist;Min guard Ambulation Distance (Feet): 75 Feet Assistive device: Standard walker Gait Pattern/deviations: Step-to pattern;Decreased stride length;Trunk flexed Gait velocity: decreased Gait velocity interpretation: Below normal speed for age/gender General Gait Details: pt with minimal knee flexion and ankle ROM with gait, Rt LE step to pattern, decreased step height, multiple cues to pick up head   Stairs            Wheelchair Mobility    Modified Rankin (Stroke Patients Only)       Balance Overall balance assessment: Needs assistance   Sitting balance-Leahy Scale: Fair       Standing balance-Leahy Scale: Poor Standing balance comment: RW                    Cognition Arousal/Alertness: Awake/alert Behavior During Therapy: WFL for tasks assessed/performed Overall Cognitive Status: Within Functional Limits for tasks assessed                      Exercises      General Comments General comments (skin integrity, edema, etc.): No c/o dyspnea with increased acitivty toelrance, pt with bowel incontinence while walking however unaware      Pertinent Vitals/Pain Pain Assessment: No/denies pain    Home Living                      Prior Function            PT Goals (current goals can now be found in the care plan section) Acute Rehab PT Goals Patient Stated Goal: walk today Progress towards PT goals: Progressing toward goals    Frequency  Min 3X/week    PT Plan Current plan remains appropriate    Co-evaluation             End of Session Equipment Utilized During Treatment: Gait belt Activity Tolerance: Patient tolerated treatment well Patient left: in chair;with call bell/phone within reach;with chair alarm set     Time: 1537-1600 PT Time Calculation (min) (ACUTE ONLY): 23  min  Charges:  $Gait Training: 8-22 mins $Therapeutic Activity: 8-22 mins                    G Codes:      Ara Kussmaul 05-16-15, 4:42 PM  Ara Kussmaul, Student Physical Therapist Acute Rehab [phone]

## 2015-04-19 DIAGNOSIS — I82402 Acute embolism and thrombosis of unspecified deep veins of left lower extremity: Secondary | ICD-10-CM | POA: Diagnosis not present

## 2015-04-19 DIAGNOSIS — R262 Difficulty in walking, not elsewhere classified: Secondary | ICD-10-CM | POA: Diagnosis not present

## 2015-04-19 DIAGNOSIS — R278 Other lack of coordination: Secondary | ICD-10-CM | POA: Diagnosis not present

## 2015-04-19 DIAGNOSIS — D631 Anemia in chronic kidney disease: Secondary | ICD-10-CM | POA: Diagnosis not present

## 2015-04-19 DIAGNOSIS — N186 End stage renal disease: Secondary | ICD-10-CM | POA: Diagnosis not present

## 2015-04-19 DIAGNOSIS — E441 Mild protein-calorie malnutrition: Secondary | ICD-10-CM | POA: Diagnosis not present

## 2015-04-19 DIAGNOSIS — I420 Dilated cardiomyopathy: Secondary | ICD-10-CM | POA: Diagnosis not present

## 2015-04-19 DIAGNOSIS — I509 Heart failure, unspecified: Secondary | ICD-10-CM | POA: Diagnosis not present

## 2015-04-19 DIAGNOSIS — I639 Cerebral infarction, unspecified: Secondary | ICD-10-CM | POA: Diagnosis not present

## 2015-04-19 DIAGNOSIS — Z743 Need for continuous supervision: Secondary | ICD-10-CM | POA: Diagnosis not present

## 2015-04-19 DIAGNOSIS — R270 Ataxia, unspecified: Secondary | ICD-10-CM | POA: Diagnosis not present

## 2015-04-19 DIAGNOSIS — M6281 Muscle weakness (generalized): Secondary | ICD-10-CM | POA: Diagnosis not present

## 2015-04-19 DIAGNOSIS — M2681 Anterior soft tissue impingement: Secondary | ICD-10-CM | POA: Diagnosis not present

## 2015-04-19 DIAGNOSIS — I69959 Hemiplegia and hemiparesis following unspecified cerebrovascular disease affecting unspecified side: Secondary | ICD-10-CM | POA: Diagnosis not present

## 2015-04-19 DIAGNOSIS — E785 Hyperlipidemia, unspecified: Secondary | ICD-10-CM | POA: Diagnosis not present

## 2015-04-19 DIAGNOSIS — Z992 Dependence on renal dialysis: Secondary | ICD-10-CM

## 2015-04-19 DIAGNOSIS — L03116 Cellulitis of left lower limb: Secondary | ICD-10-CM | POA: Diagnosis not present

## 2015-04-19 DIAGNOSIS — I739 Peripheral vascular disease, unspecified: Secondary | ICD-10-CM | POA: Diagnosis not present

## 2015-04-19 DIAGNOSIS — R261 Paralytic gait: Secondary | ICD-10-CM | POA: Diagnosis not present

## 2015-04-19 DIAGNOSIS — I6990 Unspecified sequelae of unspecified cerebrovascular disease: Secondary | ICD-10-CM | POA: Diagnosis not present

## 2015-04-19 DIAGNOSIS — E1040 Type 1 diabetes mellitus with diabetic neuropathy, unspecified: Secondary | ICD-10-CM | POA: Diagnosis not present

## 2015-04-19 DIAGNOSIS — H33003 Unspecified retinal detachment with retinal break, bilateral: Secondary | ICD-10-CM | POA: Diagnosis not present

## 2015-04-19 DIAGNOSIS — I2510 Atherosclerotic heart disease of native coronary artery without angina pectoris: Secondary | ICD-10-CM | POA: Diagnosis not present

## 2015-04-19 DIAGNOSIS — L03119 Cellulitis of unspecified part of limb: Secondary | ICD-10-CM | POA: Diagnosis not present

## 2015-04-19 DIAGNOSIS — E1129 Type 2 diabetes mellitus with other diabetic kidney complication: Secondary | ICD-10-CM | POA: Diagnosis not present

## 2015-04-19 DIAGNOSIS — I10 Essential (primary) hypertension: Secondary | ICD-10-CM | POA: Diagnosis not present

## 2015-04-19 DIAGNOSIS — I120 Hypertensive chronic kidney disease with stage 5 chronic kidney disease or end stage renal disease: Secondary | ICD-10-CM | POA: Diagnosis not present

## 2015-04-19 DIAGNOSIS — E1121 Type 2 diabetes mellitus with diabetic nephropathy: Secondary | ICD-10-CM | POA: Diagnosis not present

## 2015-04-19 DIAGNOSIS — L0390 Cellulitis, unspecified: Secondary | ICD-10-CM | POA: Diagnosis not present

## 2015-04-19 DIAGNOSIS — N2581 Secondary hyperparathyroidism of renal origin: Secondary | ICD-10-CM | POA: Diagnosis not present

## 2015-04-19 LAB — CBC
HCT: 30.9 % — ABNORMAL LOW (ref 36.0–46.0)
HEMOGLOBIN: 9.8 g/dL — AB (ref 12.0–15.0)
MCH: 31.6 pg (ref 26.0–34.0)
MCHC: 31.7 g/dL (ref 30.0–36.0)
MCV: 99.7 fL (ref 78.0–100.0)
PLATELETS: 141 10*3/uL — AB (ref 150–400)
RBC: 3.1 MIL/uL — AB (ref 3.87–5.11)
RDW: 17.9 % — ABNORMAL HIGH (ref 11.5–15.5)
WBC: 3.8 10*3/uL — AB (ref 4.0–10.5)

## 2015-04-19 LAB — PROTIME-INR
INR: 2.2 — ABNORMAL HIGH (ref 0.00–1.49)
PROTHROMBIN TIME: 24.3 s — AB (ref 11.6–15.2)

## 2015-04-19 LAB — GLUCOSE, CAPILLARY
GLUCOSE-CAPILLARY: 106 mg/dL — AB (ref 65–99)
GLUCOSE-CAPILLARY: 124 mg/dL — AB (ref 65–99)

## 2015-04-19 LAB — HEPARIN LEVEL (UNFRACTIONATED): Heparin Unfractionated: 0.29 IU/mL — ABNORMAL LOW (ref 0.30–0.70)

## 2015-04-19 MED ORDER — WARFARIN SODIUM 10 MG PO TABS: 10.0000 mg | ORAL_TABLET | Freq: Once | ORAL | Status: DC

## 2015-04-19 MED ORDER — WARFARIN SODIUM 5 MG PO TABS: ORAL_TABLET | ORAL | Status: DC

## 2015-04-19 MED ORDER — CALCITRIOL 0.5 MCG PO CAPS: 1.0000 ug | ORAL_CAPSULE | ORAL | Status: DC

## 2015-04-19 NOTE — Care Management Important Message (Signed)
Important Message  Patient Details  Name: Stefanie [name] MRN: [id number] Date of Birth: 1946-03-05   Medicare Important Message Given:  Yes    Dalylah Ramey P [place] 04/19/2015, 4:21 PM

## 2015-04-19 NOTE — Clinical Social Work Note (Signed)
Patient medically stable for discharge and will be transported by ambulance to [facility] facility in [place].  Daughter will be contacted once transport called.   Hamzeh Tall Givens, MSW, LCSW Licensed Clinical Social Worker [place] [phone]

## 2015-04-19 NOTE — Progress Notes (Signed)
[facility] Progress Note  Assessment/Plan: 1. LLE cellulitis w serratia bacteremia - Per primary. Serratia Marcescens in 1 of 2 BCs. Rx 10 days IV abx / fortaz w HD. Need to know when last dose of antibiotic is to be given.   2. ESRD -TTS for HD today K 4.4; had bleeding from access yesterday- slight ooze- No bleeding noted today. last intervention was PTA of instent stenosis 11/23 by Dr. Oneida Alar; plan monitor treatment - has been on heparin drip; using no heparin today; repeat AF after discharge. May need to see CKV. 3. Anemia - HGB 9.9. On ESA-rec'd 04/16/15. Follow HGB 4. Secondary hyperparathyroidism - Ca 9 Continue binders, sensipar and calcitriol  5. HTN/volume - HD 04/18/15 Net 4007 post wt 71.9 kg.  6. Nutrition - Albumin 2.9 Renal diet with Fld restrictions 7. LLE DVT: INR therapeutic this am 2.20. Dr. Acie Fredrickson in [place] coumadin clinic will manage INR  8. H/O COPD: Per primary.  9. DM: per primary. S/S insulin. BS controlled.  10. Disp - poss d/c today  [name] NP-C 04/19/2015, 10:39 AM  [facility] [phone]  Patient seen and examined, agree with above note with above modifications. Been waiting several days for discharge- family decided now to have her go to [facility] so that is in the works hopefully for today  [name], MD 04/19/2015     Subjective:  "I hope I get to leave today". States L ankle "still sore". Denies SOB.   Objective Filed Vitals:   04/18/15 1725 04/19/15 0500 04/19/15 0557 04/19/15 0821  BP: 120/89  134/54 135/74  Pulse: 67  59 57  Temp: 98.2 F (36.8 C)  98.6 F (37 C) 97.8 F (36.6 C)  TempSrc: Oral  Oral Oral  Resp: 20  22 20   Height:      Weight:  71.9 kg (158 lb 8.2 oz)    SpO2: 100%  99% 100%   Physical Exam General: elderly female up in chair NAD Heart: S1, S2, No M/G/R. SR on monitor,   Lungs: Bilateral breath sounds CTA A/P. No WOB.  Abdomen: soft nontender with active  BS Extremities: No LE edema. Healing cellulitis LLE Dialysis Access: L thigh AVG + thrill. + bruit. Bandage intact from HD yesterday, no bleeding.   Dialysis Orders: 3 hrs 30 min, 180NRe Optiflux, BFR 450, DFR Autoflow 1.5, EDW 73 (kg), Dialysate 2.0 K, 2.0 Ca, UFR Profile: Profile 4, Sodium Model: Linear, Access: L thigh AV Graft  Heparin: 4000 units per treatment Calcitriol: 1.0 mcg PO q TTS (last dose 04/06/15) Mircera: 75 mcg IV q 2 weeks (last dose 04/02/15)  Additional Objective Labs: Basic Metabolic Panel:  Recent Labs Lab 04/16/15 0640 04/18/15 0636  NA 135 133*  K 4.5 4.4  CL 97* 94*  CO2 24 27  GLUCOSE 94 134*  BUN 44* 34*  CREATININE 7.89* 6.99*  CALCIUM 8.0* 9.0  PHOS 4.6 4.7*   Liver Function Tests:  Recent Labs Lab 04/16/15 0640 04/18/15 0636  ALBUMIN 2.7* 2.9*   No results for input(s): LIPASE, AMYLASE in the last 168 hours. CBC:  Recent Labs Lab 04/16/15 0640 04/16/15 1121 04/17/15 0548 04/18/15 0636 04/19/15 0530  WBC 4.1 4.2 4.8 3.9* 3.8*  HGB 9.4* 10.8* 10.1* 9.9* 9.8*  HCT 29.4* 33.4* 31.4* 30.6* 30.9*  MCV 95.8 96.3 98.7 98.4 99.7  PLT 107* 107* 116* 116* 141*   Blood Culture    Component Value Date/Time   SDES BLOOD RIGHT ANTECUBITAL  04/09/2015 1425   [place] 04/09/2015 [place] 04/09/2015 1425   REPTSTATUS 04/12/2015 FINAL 04/09/2015 1425    Cardiac Enzymes: No results for input(s): CKTOTAL, CKMB, CKMBINDEX, TROPONINI in the last 168 hours. CBG:  Recent Labs Lab 04/18/15 1441 04/18/15 1512 04/18/15 1717 04/18/15 2021 04/19/15 0805  GLUCAP 59* 72 200* 192* 106*   Iron Studies: No results for input(s): IRON, TIBC, TRANSFERRIN, FERRITIN in the last 72 hours. @lablastinr3 @ Studies/Results: No results found. Medications:   . calcitRIOL  1 mcg Oral Q T,Th,Sa-HD  . calcium acetate  2,001 mg Oral BID WC  . carvedilol  3.125 mg Oral BID WC  . cinacalcet  90 mg Oral Q  breakfast  . darbepoetin (ARANESP) injection - DIALYSIS  60 mcg Intravenous Q Tue-HD  . gabapentin  100 mg Oral QHS  . insulin aspart  0-15 Units Subcutaneous TID WC  . insulin aspart  0-5 Units Subcutaneous QHS  . insulin glargine  10 Units Subcutaneous QHS  . latanoprost  1 drop Left Eye QHS  . lisinopril  30 mg Oral QHS  . multivitamin  1 tablet Oral QHS  . pravastatin  40 mg Oral q1800  . rOPINIRole  0.5 mg Oral QHS  . saccharomyces boulardii  250 mg Oral BID  . warfarin  10 mg Oral ONCE-1800  . warfarin   Does not apply Once  . Warfarin - Pharmacist Dosing Inpatient   Does not apply [phone]

## 2015-04-19 NOTE — Discharge Summary (Signed)
Physician Discharge Summary  Stefanie [name] [id number] DOB: 1946-03-12 DOA: 04/09/2015  PCP: Bonnita Nasuti, MD  Admit date: 04/09/2015 Discharge date: 04/19/2015  Time spent: > 35 minutes  Recommendations for Outpatient Follow-up:  1.    Discharge Diagnoses:  Principal Problem:   Cellulitis Active Problems:   Essential hypertension   ESRD on dialysis [facility])   PAD (peripheral artery disease) (HCC)   Leukocytosis   Chronic diastolic congestive heart failure (HCC)   History of CVA with residual deficit   Bacteremia   Lactic acidosis   Discharge Condition: stable  Diet recommendation: renal diet  Filed Weights   04/18/15 0924 04/18/15 1300 04/19/15 0500  Weight: 76.2 kg (167 lb 15.9 oz) 71.9 kg (158 lb 8.2 oz) 71.9 kg (158 lb 8.2 oz)    History of present illness:  70 year old female patient with history of ESRD on TTS HD, DM, CVA, HTN, DVT LAD, presented from dialysis Center in [place] to [facility] ED on 04/09/15 with complaints of left leg pain. Vascular surgery evaluated ED and did not feel vascular insufficiency was etiology. Admitted for cellulitis of left leg and bacteremia. Nephrology following. Patient clinically improved. Eliquis discontinued and patient started on warfarin anticoagulation and IV heparin bridging. Left lower extremity cellulitis resolved. Completes IV antibiotics on 04/18/15.  Hospital Course:  1. Left leg cellulitis: h/o PAD status post stenting to left common iliac vein and left common femoral vein. Patient evaluated by vascular surgery on 1/24 and pain felt not likely from vascular insufficiency. Venous Doppler negative for acute DVT. Started empirically on IV vancomycin and Zosyn. Nephrology discussed with infectious disease who recommended transitioning to IV Fortaz across HD and treat for total 7-10 days. Completes IV antibiotics after 04/18/15 doses. Cellulitis resolved.  2. Serratia bacteremia: One of 2 blood cultures positive. Initially started on  vancomycin and Zosyn. Switched to IV [place] across HD as per ID recommendations and will complete antibiotics after 04/18/15 doses.  3. ESRD on TTS HD: Nephrology consulted and management per nephrology while patient was in house. 4. Chronic systolic CHF: Echo November 2016: LVEF 35% and grade 2 diastolic dysfunction. Compensated clinically but dry weight up by 7-8 kilos per nephrology who planned to Max UF with HD. Continue Coreg & lisinopril. 5. History of DVT left leg: Was on Coumadin initially when DVT first diagnosed in November 2016. At some point was seen at [facility] ED and started on Apixaban. Nephrology has weighed in and indicate that Apixaban is not well studied in ESRD patients and hence was discontinued and patient was started on Coumadin and IV heparin bridge. INR trending up Dr. Acie Fredrickson in [place] Coumadin clinic will manage INR as OP >this can be coordinated by nephrology service with blood draws across dialysis.Minimal bleeding L thigh AVG site-monitor closely while being anticoagulated 6. Essential hypertension:  Continue carvedilol and lisinopril.  7. Type II DM with renal complications: Continue Lantus and diabetic diet.  8. 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 Hemoglobin stable.  9. History of CVA with residual aphasia  Procedures:  Please see above  Consultations:  Nephrology  Vascular surgeon  Discharge Exam: Filed Vitals:   04/19/15 0557 04/19/15 0821  BP: 134/54 135/74  Pulse: 59 57  Temp: 98.6 F (37 C) 97.8 F (36.6 C)  Resp: 22 20    General: Pt in nad, alert and awake Cardiovascular: rrr, no mrg Respiratory: cta bl, no wheezes  Discharge Instructions   Discharge Instructions    Call MD  for:  difficulty breathing, headache or visual disturbances    Complete by:  As directed      Call MD for:  severe uncontrolled pain    Complete by:  As directed      Call MD for:  temperature >100.4    Complete by:  As  directed      Diet - low sodium heart healthy    Complete by:  As directed      Discharge instructions    Complete by:  As directed   Continue routine follow-up with nephrologist for management of your end-stage renal disease     Increase activity slowly    Complete by:  As directed           Current Discharge Medication List    CONTINUE these medications which have CHANGED   Details  calcitRIOL (ROCALTROL) 0.5 MCG capsule Take 2 capsules (1 mcg total) by mouth Every Tuesday,Thursday,and Saturday with dialysis. Qty: 30 capsule, Refills: 0    warfarin (COUMADIN) 5 MG tablet Take 7.5 mg po daily      CONTINUE these medications which have NOT CHANGED   Details  acetaminophen (TYLENOL) 325 MG tablet Take 1-2 tablets (325-650 mg total) by mouth every 4 (four) hours as needed for mild pain.    calcium acetate (ELIPHOS) 667 MG tablet Take 667 mg by mouth 3 (three) times daily with meals. Take 3 capsule in the morning, 2 capsules with a snack and 3 capsules in the evening.    cinacalcet (SENSIPAR) 90 MG tablet Take 90 mg by mouth daily.    folic acid-vitamin b complex-vitamin c-selenium-zinc (DIALYVITE) 3 MG TABS tablet Take 1 tablet by mouth daily.    gabapentin (NEURONTIN) 300 MG capsule Take 300 mg by mouth at bedtime.    insulin glargine (LANTUS) 100 UNIT/ML injection Inject 0.1 mLs (10 Units total) into the skin at bedtime. Qty: 10 mL, Refills: 11    latanoprost (XALATAN) 0.005 % ophthalmic solution Place 1 drop into the left eye at bedtime.    lisinopril (PRINIVIL,ZESTRIL) 30 MG tablet Take 30 mg by mouth at bedtime.    nitroGLYCERIN (NITROSTAT) 0.4 MG SL tablet Place 1 tablet (0.4 mg total) under the tongue every 5 (five) minutes as needed for chest pain. Refills: 12    polyethylene glycol (MIRALAX / GLYCOLAX) packet Take 17 g by mouth daily as needed. Qty: 14 each, Refills: 0    pravastatin (PRAVACHOL) 40 MG tablet Take 40 mg by mouth daily.     PROAIR HFA 108 (90  BASE) MCG/ACT inhaler Inhale 1 puff into the lungs every 4 (four) hours as needed for wheezing or shortness of breath.     rOPINIRole (REQUIP) 0.5 MG tablet Take 0.5 mg by mouth at bedtime. Refills: 12    saccharomyces boulardii (FLORASTOR) 250 MG capsule Take 1 capsule (250 mg total) by mouth 2 (two) times daily.    VOLTAREN 1 % GEL APPLY 2 GRAMS TO AREA UP TO 4 TIMES A DAY Refills: 2    carvedilol (COREG) 3.125 MG tablet Take 1 tablet (3.125 mg total) by mouth 2 (two) times daily with a meal.    Darbepoetin Alfa (ARANESP) 100 MCG/0.5ML SOSY injection Inject 0.5 mLs (100 mcg total) into the vein every Tuesday with hemodialysis. Qty: 4.2 mL    ketoconazole (NIZORAL) 2 % cream Apply 1 application topically daily. Qty: 15 g, Refills: 0    lidocaine-prilocaine (EMLA) cream Apply 1 application topically as needed (topical anesthesia for hemodialysis  if Gebauers and Lidocaine injection are ineffective.). Qty: 30 g, Refills: 0    Menthol-Methyl Salicylate (MUSCLE RUB) 10-15 % CREA Apply 1 application topically 2 (two) times daily as needed for muscle pain. Refills: 0    multivitamin (RENA-VIT) TABS tablet Take 1 tablet by mouth at bedtime. Refills: 0    polycarbophil (FIBERCON) 625 MG tablet Take 2 tablets (1,250 mg total) by mouth daily.      STOP taking these medications     guaiFENesin (MUCINEX) 600 MG 12 hr tablet        No Known Allergies Follow-up Information    Follow up with [facility] .   Specialty:  [place] information:   [address] [place] [address] [phone]       The results of significant diagnostics from this hospitalization (including imaging, microbiology, ancillary and laboratory) are listed below for reference.    Significant Diagnostic Studies: Ct Angio Low Extrem Left W/cm &/or Wo/cm  04/09/2015  CLINICAL DATA:  Left lower extremity pain. History of end-stage renal disease with  current left thigh dialysis graft. History of left lower extremity DVT 1 year ago. EXAM: CT ANGIOGRAPHY LOWER LEFT EXTREMITY TECHNIQUE: Multidetector CT was performed of the left lower extremity during arterial phase of contrast opacification with coronal and sagittal reformatted imaging. CONTRAST:  167mL OMNIPAQUE IOHEXOL 350 MG/ML SOLN COMPARISON:  None. FINDINGS: The visualized left common iliac artery shows calcified plaque. The common iliac origin and abdominal aorta were not imaged. No significant occlusive disease is seen involving the left external iliac or common femoral arteries. A patent loop dialysis graft is present in the thigh with arterial anastomosis to the profunda femoral artery. The graft shows mild pseudoaneurysm formation along the arterial limb. Distal graft anastomosis is well visualized with the common femoral vein and shows normal patency. Venous outflow stents extend from just above the venous anastomosis into the distal common iliac vein and shows normal patency. The left SFA shows scattered calcifications but is patent without significant occlusive disease. There is limited contrast opacification at the level of the popliteal artery and tibial arteries, limiting accurate assessment of arterial patency on the current study. This is likely partially related to the fact that the dialysis graft is creating relative steal of arterial contrast proximally. No obvious embolic disease is identified. No significant nonvascular findings. There is no evidence to suggest infection or other inflammatory process of the extremity. Atrophy and fatty replacement of posterior calf musculature present. No abnormal fluid collections or evidence of hematoma. No evidence to suggest bony fracture, osteomyelitis or bone lesions. Review of the MIP images confirms the above findings. IMPRESSION: No significant occlusive disease identified of the arterial supply to the left lower extremity. Distal arterial  opacification is quite limited due to relative steal of contrast by a left thigh dialysis graft. The graft is normally patent. No evidence to suggest infection or other acute process in the left lower extremity. No bone abnormalities identified. Electronically Signed   By: Aletta Edouard M.D.   On: 04/09/2015 17:04   Dg Chest Portable 1 View  04/09/2015  CLINICAL DATA:  Shortness of breath with extreme fatigue, weakness and possible fever. History of hypertension, diabetes, congestive heart failure and hemodialysis. EXAM: PORTABLE CHEST 1 VIEW COMPARISON:  04/01/2015 and 03/18/2015. FINDINGS: 1118 hours. There is stable cardiomegaly with chronic vascular congestion and central airway thickening. No overt pulmonary edema, confluent airspace opacity, pleural effusion or pneumothorax. Vascular  stent in the left upper arm and metallic foreign body in the right supraclavicular fossa are unchanged. No acute osseous findings are seen. IMPRESSION: Cardiomegaly with chronic vascular congestion and central airway thickening, but no overt edema or focal airspace disease. Electronically Signed   By: Richardean Sale M.D.   On: 04/09/2015 11:55    Microbiology: Recent Results (from the past 240 hour(s))  Blood culture (routine x 2)     Status: None   Collection Time: 04/09/15  2:09 PM  Result Value Ref Range Status   Specimen Description BLOOD RIGHT HAND  Final   Special Requests BOTTLES DRAWN AEROBIC ONLY 3CC  Final   Culture NO GROWTH 5 DAYS  Final   Report Status 04/14/2015 FINAL  Final  Blood culture (routine x 2)     Status: None   Collection Time: 04/09/15  2:25 PM  Result Value Ref Range Status   Specimen Description BLOOD RIGHT ANTECUBITAL  Final   Special Requests IN PEDIATRIC BOTTLE 2CC  Final   Culture  Setup Time   Final    GRAM NEGATIVE RODS IN PEDIATRIC BOTTLE CRITICAL RESULT CALLED TO, READ BACK BY AND VERIFIED WITH: CWICKER,RN @0554  04/10/15 MKELLY    Culture SERRATIA MARCESCENS  Final    Report Status 04/12/2015 FINAL  Final   Organism ID, Bacteria SERRATIA MARCESCENS  Final      Susceptibility   Serratia marcescens - MIC*    CEFAZOLIN >=64 RESISTANT Resistant     CEFEPIME <=1 SENSITIVE Sensitive     CEFTAZIDIME <=1 SENSITIVE Sensitive     CEFTRIAXONE <=1 SENSITIVE Sensitive     CIPROFLOXACIN <=0.25 SENSITIVE Sensitive     GENTAMICIN <=1 SENSITIVE Sensitive     TRIMETH/SULFA <=20 SENSITIVE Sensitive     * SERRATIA MARCESCENS  MRSA PCR Screening     Status: None   Collection Time: 04/10/15  6:22 AM  Result Value Ref Range Status   MRSA by PCR NEGATIVE NEGATIVE Final    Comment:        The GeneXpert MRSA Assay (FDA approved for NASAL specimens only), is one component of a comprehensive MRSA colonization surveillance program. It is not intended to diagnose MRSA infection nor to guide or monitor treatment for MRSA infections.      Labs: Basic Metabolic Panel:  Recent Labs Lab 04/16/15 0640 04/18/15 0636  NA 135 133*  K 4.5 4.4  CL 97* 94*  CO2 24 27  GLUCOSE 94 134*  BUN 44* 34*  CREATININE 7.89* 6.99*  CALCIUM 8.0* 9.0  PHOS 4.6 4.7*   Liver Function Tests:  Recent Labs Lab 04/16/15 0640 04/18/15 0636  ALBUMIN 2.7* 2.9*   No results for input(s): LIPASE, AMYLASE in the last 168 hours. No results for input(s): AMMONIA in the last 168 hours. CBC:  Recent Labs Lab 04/16/15 0640 04/16/15 1121 04/17/15 0548 04/18/15 0636 04/19/15 0530  WBC 4.1 4.2 4.8 3.9* 3.8*  HGB 9.4* 10.8* 10.1* 9.9* 9.8*  HCT 29.4* 33.4* 31.4* 30.6* 30.9*  MCV 95.8 96.3 98.7 98.4 99.7  PLT 107* 107* 116* 116* 141*   Cardiac Enzymes: No results for input(s): CKTOTAL, CKMB, CKMBINDEX, TROPONINI in the last 168 hours. BNP: BNP (last 3 results) No results for input(s): BNP in the last 8760 hours.  ProBNP (last 3 results) No results for input(s): PROBNP in the last 8760 hours.  CBG:  Recent Labs Lab 04/18/15 1512 04/18/15 1717 04/18/15 2021 04/19/15 0805  04/19/15 1143  GLUCAP 72 200* 192* 106* 124*  Signed:  Velvet Bathe MD.  [facility] 04/19/2015, 12:44 PM

## 2015-04-19 NOTE — Progress Notes (Signed)
ANTICOAGULATION CONSULT NOTE - Follow Up Consult  Pharmacy Consult for heparin >> warfarin Indication: DVT  No Known Allergies  Patient Measurements: Height: 5\' 4"  (162.6 cm) Weight: 158 lb 8.2 oz (71.9 kg) IBW/kg (Calculated) : 54.7 Heparin Dosing Weight: 70.6 kg  Labs:  Recent Labs  04/17/15 0548 04/18/15 0636 04/18/15 1606 04/19/15 0530  HGB 10.1* 9.9*  --  9.8*  HCT 31.4* 30.6*  --  30.9*  PLT 116* 116*  --  141*  LABPROT 20.5* 24.1*  --  24.3*  INR 1.76* 2.18*  --  2.20*  HEPARINUNFRC 0.60 0.24* 0.51 0.29*  CREATININE  --  6.99*  --   --     Estimated Creatinine Clearance: 7.4 mL/min (by C-G formula based on Cr of 6.99).  Assessment: Patient on warfarin previously for recent LLE DVT then switched to apixaban. Patient is ESRD patient (not well-studied in this population). As a result, patient was converted to IV heparin as a bridge with warfarin 1/27.  INR therapeutic x2 days so will d/c heparin drip. INR 2.2 this morning. No bleeding noted. CBC stable.  Goal of Therapy:  INR 2-3 Monitor platelets by anticoagulation protocol: Yes   Plan:  Discontinue heparin drip Warfarin 10 mg PO tonight  Daily INR and CBC Monitor for s/sx of bleeding Consider warfarin 7.5 mg daily except 10 mg on Fri at discharge  [facility], [place].D., BCPS Clinical Pharmacist Pager: [phone] 04/19/2015 8:22 AM

## 2015-04-22 DIAGNOSIS — L03116 Cellulitis of left lower limb: Secondary | ICD-10-CM | POA: Diagnosis not present

## 2015-04-22 DIAGNOSIS — N2581 Secondary hyperparathyroidism of renal origin: Secondary | ICD-10-CM | POA: Diagnosis not present

## 2015-04-22 DIAGNOSIS — D631 Anemia in chronic kidney disease: Secondary | ICD-10-CM | POA: Diagnosis not present

## 2015-04-22 DIAGNOSIS — E1129 Type 2 diabetes mellitus with other diabetic kidney complication: Secondary | ICD-10-CM | POA: Diagnosis not present

## 2015-04-22 DIAGNOSIS — N186 End stage renal disease: Secondary | ICD-10-CM | POA: Diagnosis not present

## 2015-04-24 DIAGNOSIS — E1129 Type 2 diabetes mellitus with other diabetic kidney complication: Secondary | ICD-10-CM | POA: Diagnosis not present

## 2015-04-24 DIAGNOSIS — L03116 Cellulitis of left lower limb: Secondary | ICD-10-CM | POA: Diagnosis not present

## 2015-04-24 DIAGNOSIS — D631 Anemia in chronic kidney disease: Secondary | ICD-10-CM | POA: Diagnosis not present

## 2015-04-24 DIAGNOSIS — N2581 Secondary hyperparathyroidism of renal origin: Secondary | ICD-10-CM | POA: Diagnosis not present

## 2015-04-24 DIAGNOSIS — N186 End stage renal disease: Secondary | ICD-10-CM | POA: Diagnosis not present

## 2015-04-27 DIAGNOSIS — D631 Anemia in chronic kidney disease: Secondary | ICD-10-CM | POA: Diagnosis not present

## 2015-04-27 DIAGNOSIS — N186 End stage renal disease: Secondary | ICD-10-CM | POA: Diagnosis not present

## 2015-04-27 DIAGNOSIS — L03116 Cellulitis of left lower limb: Secondary | ICD-10-CM | POA: Diagnosis not present

## 2015-04-27 DIAGNOSIS — N2581 Secondary hyperparathyroidism of renal origin: Secondary | ICD-10-CM | POA: Diagnosis not present

## 2015-04-27 DIAGNOSIS — E1129 Type 2 diabetes mellitus with other diabetic kidney complication: Secondary | ICD-10-CM | POA: Diagnosis not present

## 2015-04-30 DIAGNOSIS — L03116 Cellulitis of left lower limb: Secondary | ICD-10-CM | POA: Diagnosis not present

## 2015-04-30 DIAGNOSIS — N2581 Secondary hyperparathyroidism of renal origin: Secondary | ICD-10-CM | POA: Diagnosis not present

## 2015-04-30 DIAGNOSIS — D631 Anemia in chronic kidney disease: Secondary | ICD-10-CM | POA: Diagnosis not present

## 2015-04-30 DIAGNOSIS — I82402 Acute embolism and thrombosis of unspecified deep veins of left lower extremity: Secondary | ICD-10-CM | POA: Diagnosis not present

## 2015-04-30 DIAGNOSIS — E1129 Type 2 diabetes mellitus with other diabetic kidney complication: Secondary | ICD-10-CM | POA: Diagnosis not present

## 2015-04-30 DIAGNOSIS — N186 End stage renal disease: Secondary | ICD-10-CM | POA: Diagnosis not present

## 2015-05-01 DIAGNOSIS — I132 Hypertensive heart and chronic kidney disease with heart failure and with stage 5 chronic kidney disease, or end stage renal disease: Secondary | ICD-10-CM | POA: Diagnosis not present

## 2015-05-01 DIAGNOSIS — N186 End stage renal disease: Secondary | ICD-10-CM | POA: Diagnosis not present

## 2015-05-01 DIAGNOSIS — I69359 Hemiplegia and hemiparesis following cerebral infarction affecting unspecified side: Secondary | ICD-10-CM | POA: Diagnosis not present

## 2015-05-01 DIAGNOSIS — E1122 Type 2 diabetes mellitus with diabetic chronic kidney disease: Secondary | ICD-10-CM | POA: Diagnosis not present

## 2015-05-01 DIAGNOSIS — I509 Heart failure, unspecified: Secondary | ICD-10-CM | POA: Diagnosis not present

## 2015-05-01 DIAGNOSIS — I82402 Acute embolism and thrombosis of unspecified deep veins of left lower extremity: Secondary | ICD-10-CM | POA: Diagnosis not present

## 2015-05-02 DIAGNOSIS — N2581 Secondary hyperparathyroidism of renal origin: Secondary | ICD-10-CM | POA: Diagnosis not present

## 2015-05-02 DIAGNOSIS — N186 End stage renal disease: Secondary | ICD-10-CM | POA: Diagnosis not present

## 2015-05-02 DIAGNOSIS — D631 Anemia in chronic kidney disease: Secondary | ICD-10-CM | POA: Diagnosis not present

## 2015-05-02 DIAGNOSIS — E1129 Type 2 diabetes mellitus with other diabetic kidney complication: Secondary | ICD-10-CM | POA: Diagnosis not present

## 2015-05-02 DIAGNOSIS — L03116 Cellulitis of left lower limb: Secondary | ICD-10-CM | POA: Diagnosis not present

## 2015-05-03 DIAGNOSIS — M7989 Other specified soft tissue disorders: Secondary | ICD-10-CM | POA: Diagnosis not present

## 2015-05-03 DIAGNOSIS — L03116 Cellulitis of left lower limb: Secondary | ICD-10-CM | POA: Diagnosis not present

## 2015-05-03 DIAGNOSIS — M79662 Pain in left lower leg: Secondary | ICD-10-CM | POA: Diagnosis not present

## 2015-05-04 DIAGNOSIS — N2581 Secondary hyperparathyroidism of renal origin: Secondary | ICD-10-CM | POA: Diagnosis not present

## 2015-05-04 DIAGNOSIS — E1129 Type 2 diabetes mellitus with other diabetic kidney complication: Secondary | ICD-10-CM | POA: Diagnosis not present

## 2015-05-04 DIAGNOSIS — D631 Anemia in chronic kidney disease: Secondary | ICD-10-CM | POA: Diagnosis not present

## 2015-05-04 DIAGNOSIS — N186 End stage renal disease: Secondary | ICD-10-CM | POA: Diagnosis not present

## 2015-05-04 DIAGNOSIS — L03116 Cellulitis of left lower limb: Secondary | ICD-10-CM | POA: Diagnosis not present

## 2015-05-07 DIAGNOSIS — D631 Anemia in chronic kidney disease: Secondary | ICD-10-CM | POA: Diagnosis not present

## 2015-05-07 DIAGNOSIS — N2581 Secondary hyperparathyroidism of renal origin: Secondary | ICD-10-CM | POA: Diagnosis not present

## 2015-05-07 DIAGNOSIS — I82402 Acute embolism and thrombosis of unspecified deep veins of left lower extremity: Secondary | ICD-10-CM | POA: Diagnosis not present

## 2015-05-07 DIAGNOSIS — E1129 Type 2 diabetes mellitus with other diabetic kidney complication: Secondary | ICD-10-CM | POA: Diagnosis not present

## 2015-05-07 DIAGNOSIS — L03116 Cellulitis of left lower limb: Secondary | ICD-10-CM | POA: Diagnosis not present

## 2015-05-07 DIAGNOSIS — N186 End stage renal disease: Secondary | ICD-10-CM | POA: Diagnosis not present

## 2015-05-08 DIAGNOSIS — I82402 Acute embolism and thrombosis of unspecified deep veins of left lower extremity: Secondary | ICD-10-CM | POA: Diagnosis not present

## 2015-05-08 DIAGNOSIS — I132 Hypertensive heart and chronic kidney disease with heart failure and with stage 5 chronic kidney disease, or end stage renal disease: Secondary | ICD-10-CM | POA: Diagnosis not present

## 2015-05-08 DIAGNOSIS — E1122 Type 2 diabetes mellitus with diabetic chronic kidney disease: Secondary | ICD-10-CM | POA: Diagnosis not present

## 2015-05-08 DIAGNOSIS — I69359 Hemiplegia and hemiparesis following cerebral infarction affecting unspecified side: Secondary | ICD-10-CM | POA: Diagnosis not present

## 2015-05-08 DIAGNOSIS — N186 End stage renal disease: Secondary | ICD-10-CM | POA: Diagnosis not present

## 2015-05-08 DIAGNOSIS — I509 Heart failure, unspecified: Secondary | ICD-10-CM | POA: Diagnosis not present

## 2015-05-09 DIAGNOSIS — N186 End stage renal disease: Secondary | ICD-10-CM | POA: Diagnosis not present

## 2015-05-09 DIAGNOSIS — N2581 Secondary hyperparathyroidism of renal origin: Secondary | ICD-10-CM | POA: Diagnosis not present

## 2015-05-09 DIAGNOSIS — L03116 Cellulitis of left lower limb: Secondary | ICD-10-CM | POA: Diagnosis not present

## 2015-05-09 DIAGNOSIS — E1129 Type 2 diabetes mellitus with other diabetic kidney complication: Secondary | ICD-10-CM | POA: Diagnosis not present

## 2015-05-09 DIAGNOSIS — I82402 Acute embolism and thrombosis of unspecified deep veins of left lower extremity: Secondary | ICD-10-CM | POA: Diagnosis not present

## 2015-05-09 DIAGNOSIS — D631 Anemia in chronic kidney disease: Secondary | ICD-10-CM | POA: Diagnosis not present

## 2015-05-11 DIAGNOSIS — N2581 Secondary hyperparathyroidism of renal origin: Secondary | ICD-10-CM | POA: Diagnosis not present

## 2015-05-11 DIAGNOSIS — E1129 Type 2 diabetes mellitus with other diabetic kidney complication: Secondary | ICD-10-CM | POA: Diagnosis not present

## 2015-05-11 DIAGNOSIS — N186 End stage renal disease: Secondary | ICD-10-CM | POA: Diagnosis not present

## 2015-05-11 DIAGNOSIS — D631 Anemia in chronic kidney disease: Secondary | ICD-10-CM | POA: Diagnosis not present

## 2015-05-11 DIAGNOSIS — L03116 Cellulitis of left lower limb: Secondary | ICD-10-CM | POA: Diagnosis not present

## 2015-05-13 DIAGNOSIS — Z992 Dependence on renal dialysis: Secondary | ICD-10-CM | POA: Diagnosis not present

## 2015-05-13 DIAGNOSIS — L03116 Cellulitis of left lower limb: Secondary | ICD-10-CM | POA: Diagnosis not present

## 2015-05-13 DIAGNOSIS — Z794 Long term (current) use of insulin: Secondary | ICD-10-CM | POA: Diagnosis not present

## 2015-05-13 DIAGNOSIS — I10 Essential (primary) hypertension: Secondary | ICD-10-CM | POA: Diagnosis not present

## 2015-05-13 DIAGNOSIS — E1121 Type 2 diabetes mellitus with diabetic nephropathy: Secondary | ICD-10-CM | POA: Diagnosis not present

## 2015-05-13 DIAGNOSIS — N186 End stage renal disease: Secondary | ICD-10-CM | POA: Diagnosis not present

## 2015-05-13 DIAGNOSIS — N185 Chronic kidney disease, stage 5: Secondary | ICD-10-CM | POA: Diagnosis not present

## 2015-05-13 DIAGNOSIS — I82402 Acute embolism and thrombosis of unspecified deep veins of left lower extremity: Secondary | ICD-10-CM | POA: Diagnosis not present

## 2015-05-13 DIAGNOSIS — E1122 Type 2 diabetes mellitus with diabetic chronic kidney disease: Secondary | ICD-10-CM | POA: Diagnosis not present

## 2015-05-13 DIAGNOSIS — Z7901 Long term (current) use of anticoagulants: Secondary | ICD-10-CM | POA: Diagnosis not present

## 2015-05-13 DIAGNOSIS — Z87891 Personal history of nicotine dependence: Secondary | ICD-10-CM | POA: Diagnosis not present

## 2015-05-13 DIAGNOSIS — I132 Hypertensive heart and chronic kidney disease with heart failure and with stage 5 chronic kidney disease, or end stage renal disease: Secondary | ICD-10-CM | POA: Diagnosis not present

## 2015-05-13 DIAGNOSIS — E785 Hyperlipidemia, unspecified: Secondary | ICD-10-CM | POA: Diagnosis not present

## 2015-05-13 DIAGNOSIS — I69359 Hemiplegia and hemiparesis following cerebral infarction affecting unspecified side: Secondary | ICD-10-CM | POA: Diagnosis not present

## 2015-05-13 DIAGNOSIS — I509 Heart failure, unspecified: Secondary | ICD-10-CM | POA: Diagnosis not present

## 2015-05-13 DIAGNOSIS — M6281 Muscle weakness (generalized): Secondary | ICD-10-CM | POA: Diagnosis not present

## 2015-05-14 DIAGNOSIS — Z992 Dependence on renal dialysis: Secondary | ICD-10-CM | POA: Diagnosis not present

## 2015-05-14 DIAGNOSIS — L03116 Cellulitis of left lower limb: Secondary | ICD-10-CM | POA: Diagnosis not present

## 2015-05-14 DIAGNOSIS — D631 Anemia in chronic kidney disease: Secondary | ICD-10-CM | POA: Diagnosis not present

## 2015-05-14 DIAGNOSIS — N186 End stage renal disease: Secondary | ICD-10-CM | POA: Diagnosis not present

## 2015-05-14 DIAGNOSIS — E1129 Type 2 diabetes mellitus with other diabetic kidney complication: Secondary | ICD-10-CM | POA: Diagnosis not present

## 2015-05-14 DIAGNOSIS — N2581 Secondary hyperparathyroidism of renal origin: Secondary | ICD-10-CM | POA: Diagnosis not present

## 2015-05-14 DIAGNOSIS — I82402 Acute embolism and thrombosis of unspecified deep veins of left lower extremity: Secondary | ICD-10-CM | POA: Diagnosis not present

## 2015-05-15 DIAGNOSIS — I509 Heart failure, unspecified: Secondary | ICD-10-CM | POA: Diagnosis not present

## 2015-05-15 DIAGNOSIS — I69359 Hemiplegia and hemiparesis following cerebral infarction affecting unspecified side: Secondary | ICD-10-CM | POA: Diagnosis not present

## 2015-05-15 DIAGNOSIS — E1122 Type 2 diabetes mellitus with diabetic chronic kidney disease: Secondary | ICD-10-CM | POA: Diagnosis not present

## 2015-05-15 DIAGNOSIS — I132 Hypertensive heart and chronic kidney disease with heart failure and with stage 5 chronic kidney disease, or end stage renal disease: Secondary | ICD-10-CM | POA: Diagnosis not present

## 2015-05-15 DIAGNOSIS — N186 End stage renal disease: Secondary | ICD-10-CM | POA: Diagnosis not present

## 2015-05-15 DIAGNOSIS — L03116 Cellulitis of left lower limb: Secondary | ICD-10-CM | POA: Diagnosis not present

## 2015-05-16 DIAGNOSIS — N2581 Secondary hyperparathyroidism of renal origin: Secondary | ICD-10-CM | POA: Diagnosis not present

## 2015-05-16 DIAGNOSIS — D631 Anemia in chronic kidney disease: Secondary | ICD-10-CM | POA: Diagnosis not present

## 2015-05-16 DIAGNOSIS — L03116 Cellulitis of left lower limb: Secondary | ICD-10-CM | POA: Diagnosis not present

## 2015-05-16 DIAGNOSIS — N186 End stage renal disease: Secondary | ICD-10-CM | POA: Diagnosis not present

## 2015-05-17 DIAGNOSIS — I509 Heart failure, unspecified: Secondary | ICD-10-CM | POA: Diagnosis not present

## 2015-05-17 DIAGNOSIS — N186 End stage renal disease: Secondary | ICD-10-CM | POA: Diagnosis not present

## 2015-05-17 DIAGNOSIS — I132 Hypertensive heart and chronic kidney disease with heart failure and with stage 5 chronic kidney disease, or end stage renal disease: Secondary | ICD-10-CM | POA: Diagnosis not present

## 2015-05-17 DIAGNOSIS — I69359 Hemiplegia and hemiparesis following cerebral infarction affecting unspecified side: Secondary | ICD-10-CM | POA: Diagnosis not present

## 2015-05-17 DIAGNOSIS — E1122 Type 2 diabetes mellitus with diabetic chronic kidney disease: Secondary | ICD-10-CM | POA: Diagnosis not present

## 2015-05-17 DIAGNOSIS — L03116 Cellulitis of left lower limb: Secondary | ICD-10-CM | POA: Diagnosis not present

## 2015-05-18 DIAGNOSIS — D631 Anemia in chronic kidney disease: Secondary | ICD-10-CM | POA: Diagnosis not present

## 2015-05-18 DIAGNOSIS — N2581 Secondary hyperparathyroidism of renal origin: Secondary | ICD-10-CM | POA: Diagnosis not present

## 2015-05-18 DIAGNOSIS — L03116 Cellulitis of left lower limb: Secondary | ICD-10-CM | POA: Diagnosis not present

## 2015-05-18 DIAGNOSIS — N186 End stage renal disease: Secondary | ICD-10-CM | POA: Diagnosis not present

## 2015-05-20 DIAGNOSIS — I509 Heart failure, unspecified: Secondary | ICD-10-CM | POA: Diagnosis not present

## 2015-05-20 DIAGNOSIS — E1122 Type 2 diabetes mellitus with diabetic chronic kidney disease: Secondary | ICD-10-CM | POA: Diagnosis not present

## 2015-05-20 DIAGNOSIS — I69359 Hemiplegia and hemiparesis following cerebral infarction affecting unspecified side: Secondary | ICD-10-CM | POA: Diagnosis not present

## 2015-05-20 DIAGNOSIS — N186 End stage renal disease: Secondary | ICD-10-CM | POA: Diagnosis not present

## 2015-05-20 DIAGNOSIS — L03116 Cellulitis of left lower limb: Secondary | ICD-10-CM | POA: Diagnosis not present

## 2015-05-20 DIAGNOSIS — I132 Hypertensive heart and chronic kidney disease with heart failure and with stage 5 chronic kidney disease, or end stage renal disease: Secondary | ICD-10-CM | POA: Diagnosis not present

## 2015-05-21 DIAGNOSIS — D631 Anemia in chronic kidney disease: Secondary | ICD-10-CM | POA: Diagnosis not present

## 2015-05-21 DIAGNOSIS — N186 End stage renal disease: Secondary | ICD-10-CM | POA: Diagnosis not present

## 2015-05-21 DIAGNOSIS — I82402 Acute embolism and thrombosis of unspecified deep veins of left lower extremity: Secondary | ICD-10-CM | POA: Diagnosis not present

## 2015-05-21 DIAGNOSIS — L03116 Cellulitis of left lower limb: Secondary | ICD-10-CM | POA: Diagnosis not present

## 2015-05-21 DIAGNOSIS — N2581 Secondary hyperparathyroidism of renal origin: Secondary | ICD-10-CM | POA: Diagnosis not present

## 2015-05-22 DIAGNOSIS — I509 Heart failure, unspecified: Secondary | ICD-10-CM | POA: Diagnosis not present

## 2015-05-22 DIAGNOSIS — L03116 Cellulitis of left lower limb: Secondary | ICD-10-CM | POA: Diagnosis not present

## 2015-05-22 DIAGNOSIS — N186 End stage renal disease: Secondary | ICD-10-CM | POA: Diagnosis not present

## 2015-05-22 DIAGNOSIS — I132 Hypertensive heart and chronic kidney disease with heart failure and with stage 5 chronic kidney disease, or end stage renal disease: Secondary | ICD-10-CM | POA: Diagnosis not present

## 2015-05-22 DIAGNOSIS — I69359 Hemiplegia and hemiparesis following cerebral infarction affecting unspecified side: Secondary | ICD-10-CM | POA: Diagnosis not present

## 2015-05-22 DIAGNOSIS — E1122 Type 2 diabetes mellitus with diabetic chronic kidney disease: Secondary | ICD-10-CM | POA: Diagnosis not present

## 2015-05-23 DIAGNOSIS — D631 Anemia in chronic kidney disease: Secondary | ICD-10-CM | POA: Diagnosis not present

## 2015-05-23 DIAGNOSIS — L03116 Cellulitis of left lower limb: Secondary | ICD-10-CM | POA: Diagnosis not present

## 2015-05-23 DIAGNOSIS — N2581 Secondary hyperparathyroidism of renal origin: Secondary | ICD-10-CM | POA: Diagnosis not present

## 2015-05-23 DIAGNOSIS — N186 End stage renal disease: Secondary | ICD-10-CM | POA: Diagnosis not present

## 2015-05-24 DIAGNOSIS — S299XXA Unspecified injury of thorax, initial encounter: Secondary | ICD-10-CM | POA: Diagnosis not present

## 2015-05-24 DIAGNOSIS — I509 Heart failure, unspecified: Secondary | ICD-10-CM | POA: Diagnosis not present

## 2015-05-24 DIAGNOSIS — N186 End stage renal disease: Secondary | ICD-10-CM | POA: Diagnosis not present

## 2015-05-24 DIAGNOSIS — E7800 Pure hypercholesterolemia, unspecified: Secondary | ICD-10-CM | POA: Diagnosis not present

## 2015-05-24 DIAGNOSIS — R531 Weakness: Secondary | ICD-10-CM | POA: Diagnosis not present

## 2015-05-24 DIAGNOSIS — Z794 Long term (current) use of insulin: Secondary | ICD-10-CM | POA: Diagnosis not present

## 2015-05-24 DIAGNOSIS — N3000 Acute cystitis without hematuria: Secondary | ICD-10-CM | POA: Diagnosis not present

## 2015-05-24 DIAGNOSIS — E118 Type 2 diabetes mellitus with unspecified complications: Secondary | ICD-10-CM | POA: Diagnosis not present

## 2015-05-24 DIAGNOSIS — S3993XA Unspecified injury of pelvis, initial encounter: Secondary | ICD-10-CM | POA: Diagnosis not present

## 2015-05-24 DIAGNOSIS — N3001 Acute cystitis with hematuria: Secondary | ICD-10-CM | POA: Diagnosis not present

## 2015-05-24 DIAGNOSIS — E1122 Type 2 diabetes mellitus with diabetic chronic kidney disease: Secondary | ICD-10-CM | POA: Diagnosis not present

## 2015-05-24 DIAGNOSIS — I825Y2 Chronic embolism and thrombosis of unspecified deep veins of left proximal lower extremity: Secondary | ICD-10-CM | POA: Diagnosis not present

## 2015-05-24 DIAGNOSIS — I10 Essential (primary) hypertension: Secondary | ICD-10-CM | POA: Diagnosis not present

## 2015-05-24 DIAGNOSIS — I120 Hypertensive chronic kidney disease with stage 5 chronic kidney disease or end stage renal disease: Secondary | ICD-10-CM | POA: Diagnosis not present

## 2015-05-24 DIAGNOSIS — R259 Unspecified abnormal involuntary movements: Secondary | ICD-10-CM | POA: Diagnosis not present

## 2015-05-24 DIAGNOSIS — Z86718 Personal history of other venous thrombosis and embolism: Secondary | ICD-10-CM | POA: Diagnosis not present

## 2015-05-24 DIAGNOSIS — Z992 Dependence on renal dialysis: Secondary | ICD-10-CM | POA: Diagnosis not present

## 2015-05-24 DIAGNOSIS — Z7901 Long term (current) use of anticoagulants: Secondary | ICD-10-CM | POA: Diagnosis not present

## 2015-05-24 DIAGNOSIS — M79606 Pain in leg, unspecified: Secondary | ICD-10-CM | POA: Diagnosis not present

## 2015-05-24 DIAGNOSIS — N390 Urinary tract infection, site not specified: Secondary | ICD-10-CM | POA: Diagnosis not present

## 2015-05-25 DIAGNOSIS — N186 End stage renal disease: Secondary | ICD-10-CM | POA: Diagnosis not present

## 2015-05-25 DIAGNOSIS — Z992 Dependence on renal dialysis: Secondary | ICD-10-CM | POA: Diagnosis not present

## 2015-05-25 DIAGNOSIS — I509 Heart failure, unspecified: Secondary | ICD-10-CM | POA: Diagnosis not present

## 2015-05-25 DIAGNOSIS — I825Y2 Chronic embolism and thrombosis of unspecified deep veins of left proximal lower extremity: Secondary | ICD-10-CM | POA: Diagnosis not present

## 2015-05-25 DIAGNOSIS — I120 Hypertensive chronic kidney disease with stage 5 chronic kidney disease or end stage renal disease: Secondary | ICD-10-CM | POA: Diagnosis not present

## 2015-05-25 DIAGNOSIS — E1122 Type 2 diabetes mellitus with diabetic chronic kidney disease: Secondary | ICD-10-CM | POA: Diagnosis not present

## 2015-05-25 DIAGNOSIS — E118 Type 2 diabetes mellitus with unspecified complications: Secondary | ICD-10-CM | POA: Diagnosis not present

## 2015-05-25 DIAGNOSIS — N390 Urinary tract infection, site not specified: Secondary | ICD-10-CM | POA: Diagnosis not present

## 2015-05-26 DIAGNOSIS — I509 Heart failure, unspecified: Secondary | ICD-10-CM | POA: Diagnosis not present

## 2015-05-26 DIAGNOSIS — I10 Essential (primary) hypertension: Secondary | ICD-10-CM | POA: Diagnosis not present

## 2015-05-26 DIAGNOSIS — Z992 Dependence on renal dialysis: Secondary | ICD-10-CM | POA: Diagnosis not present

## 2015-05-26 DIAGNOSIS — I120 Hypertensive chronic kidney disease with stage 5 chronic kidney disease or end stage renal disease: Secondary | ICD-10-CM | POA: Diagnosis not present

## 2015-05-26 DIAGNOSIS — I825Y2 Chronic embolism and thrombosis of unspecified deep veins of left proximal lower extremity: Secondary | ICD-10-CM | POA: Diagnosis not present

## 2015-05-26 DIAGNOSIS — N186 End stage renal disease: Secondary | ICD-10-CM | POA: Diagnosis not present

## 2015-05-26 DIAGNOSIS — E118 Type 2 diabetes mellitus with unspecified complications: Secondary | ICD-10-CM | POA: Diagnosis not present

## 2015-05-26 DIAGNOSIS — E1122 Type 2 diabetes mellitus with diabetic chronic kidney disease: Secondary | ICD-10-CM | POA: Diagnosis not present

## 2015-05-26 DIAGNOSIS — N390 Urinary tract infection, site not specified: Secondary | ICD-10-CM | POA: Diagnosis not present

## 2015-05-27 DIAGNOSIS — I10 Essential (primary) hypertension: Secondary | ICD-10-CM | POA: Diagnosis not present

## 2015-05-27 DIAGNOSIS — E118 Type 2 diabetes mellitus with unspecified complications: Secondary | ICD-10-CM | POA: Diagnosis not present

## 2015-05-27 DIAGNOSIS — E1122 Type 2 diabetes mellitus with diabetic chronic kidney disease: Secondary | ICD-10-CM | POA: Diagnosis not present

## 2015-05-27 DIAGNOSIS — N390 Urinary tract infection, site not specified: Secondary | ICD-10-CM | POA: Diagnosis not present

## 2015-05-27 DIAGNOSIS — I825Y2 Chronic embolism and thrombosis of unspecified deep veins of left proximal lower extremity: Secondary | ICD-10-CM | POA: Diagnosis not present

## 2015-05-27 DIAGNOSIS — N186 End stage renal disease: Secondary | ICD-10-CM | POA: Diagnosis not present

## 2015-05-27 DIAGNOSIS — Z7409 Other reduced mobility: Secondary | ICD-10-CM | POA: Diagnosis not present

## 2015-05-27 DIAGNOSIS — I120 Hypertensive chronic kidney disease with stage 5 chronic kidney disease or end stage renal disease: Secondary | ICD-10-CM | POA: Diagnosis not present

## 2015-05-27 DIAGNOSIS — I509 Heart failure, unspecified: Secondary | ICD-10-CM | POA: Diagnosis not present

## 2015-05-27 DIAGNOSIS — R404 Transient alteration of awareness: Secondary | ICD-10-CM | POA: Diagnosis not present

## 2015-05-27 DIAGNOSIS — Z992 Dependence on renal dialysis: Secondary | ICD-10-CM | POA: Diagnosis not present

## 2015-05-29 DIAGNOSIS — D631 Anemia in chronic kidney disease: Secondary | ICD-10-CM | POA: Diagnosis not present

## 2015-05-29 DIAGNOSIS — N2581 Secondary hyperparathyroidism of renal origin: Secondary | ICD-10-CM | POA: Diagnosis not present

## 2015-05-29 DIAGNOSIS — I82402 Acute embolism and thrombosis of unspecified deep veins of left lower extremity: Secondary | ICD-10-CM | POA: Diagnosis not present

## 2015-05-29 DIAGNOSIS — L03116 Cellulitis of left lower limb: Secondary | ICD-10-CM | POA: Diagnosis not present

## 2015-05-29 DIAGNOSIS — N186 End stage renal disease: Secondary | ICD-10-CM | POA: Diagnosis not present

## 2015-05-31 DIAGNOSIS — E1121 Type 2 diabetes mellitus with diabetic nephropathy: Secondary | ICD-10-CM | POA: Diagnosis not present

## 2015-05-31 DIAGNOSIS — I82502 Chronic embolism and thrombosis of unspecified deep veins of left lower extremity: Secondary | ICD-10-CM | POA: Diagnosis not present

## 2015-05-31 DIAGNOSIS — N186 End stage renal disease: Secondary | ICD-10-CM | POA: Diagnosis not present

## 2015-05-31 DIAGNOSIS — Z992 Dependence on renal dialysis: Secondary | ICD-10-CM | POA: Diagnosis not present

## 2015-06-01 DIAGNOSIS — L03116 Cellulitis of left lower limb: Secondary | ICD-10-CM | POA: Diagnosis not present

## 2015-06-01 DIAGNOSIS — D631 Anemia in chronic kidney disease: Secondary | ICD-10-CM | POA: Diagnosis not present

## 2015-06-01 DIAGNOSIS — N2581 Secondary hyperparathyroidism of renal origin: Secondary | ICD-10-CM | POA: Diagnosis not present

## 2015-06-01 DIAGNOSIS — N186 End stage renal disease: Secondary | ICD-10-CM | POA: Diagnosis not present

## 2015-06-04 DIAGNOSIS — N186 End stage renal disease: Secondary | ICD-10-CM | POA: Diagnosis not present

## 2015-06-04 DIAGNOSIS — N2581 Secondary hyperparathyroidism of renal origin: Secondary | ICD-10-CM | POA: Diagnosis not present

## 2015-06-04 DIAGNOSIS — D631 Anemia in chronic kidney disease: Secondary | ICD-10-CM | POA: Diagnosis not present

## 2015-06-04 DIAGNOSIS — D509 Iron deficiency anemia, unspecified: Secondary | ICD-10-CM | POA: Diagnosis not present

## 2015-06-04 DIAGNOSIS — E1129 Type 2 diabetes mellitus with other diabetic kidney complication: Secondary | ICD-10-CM | POA: Diagnosis not present

## 2015-06-05 DIAGNOSIS — Z7901 Long term (current) use of anticoagulants: Secondary | ICD-10-CM | POA: Diagnosis not present

## 2015-06-05 DIAGNOSIS — Z1389 Encounter for screening for other disorder: Secondary | ICD-10-CM | POA: Diagnosis not present

## 2015-06-05 DIAGNOSIS — N186 End stage renal disease: Secondary | ICD-10-CM | POA: Diagnosis not present

## 2015-06-05 DIAGNOSIS — E1121 Type 2 diabetes mellitus with diabetic nephropathy: Secondary | ICD-10-CM | POA: Diagnosis not present

## 2015-06-05 DIAGNOSIS — I82502 Chronic embolism and thrombosis of unspecified deep veins of left lower extremity: Secondary | ICD-10-CM | POA: Diagnosis not present

## 2015-06-06 DIAGNOSIS — E1129 Type 2 diabetes mellitus with other diabetic kidney complication: Secondary | ICD-10-CM | POA: Diagnosis not present

## 2015-06-06 DIAGNOSIS — D631 Anemia in chronic kidney disease: Secondary | ICD-10-CM | POA: Diagnosis not present

## 2015-06-06 DIAGNOSIS — N2581 Secondary hyperparathyroidism of renal origin: Secondary | ICD-10-CM | POA: Diagnosis not present

## 2015-06-06 DIAGNOSIS — N186 End stage renal disease: Secondary | ICD-10-CM | POA: Diagnosis not present

## 2015-06-06 DIAGNOSIS — D509 Iron deficiency anemia, unspecified: Secondary | ICD-10-CM | POA: Diagnosis not present

## 2015-06-08 DIAGNOSIS — N2581 Secondary hyperparathyroidism of renal origin: Secondary | ICD-10-CM | POA: Diagnosis not present

## 2015-06-08 DIAGNOSIS — D631 Anemia in chronic kidney disease: Secondary | ICD-10-CM | POA: Diagnosis not present

## 2015-06-08 DIAGNOSIS — E1129 Type 2 diabetes mellitus with other diabetic kidney complication: Secondary | ICD-10-CM | POA: Diagnosis not present

## 2015-06-08 DIAGNOSIS — D509 Iron deficiency anemia, unspecified: Secondary | ICD-10-CM | POA: Diagnosis not present

## 2015-06-08 DIAGNOSIS — N186 End stage renal disease: Secondary | ICD-10-CM | POA: Diagnosis not present

## 2015-06-11 DIAGNOSIS — N186 End stage renal disease: Secondary | ICD-10-CM | POA: Diagnosis not present

## 2015-06-11 DIAGNOSIS — E1129 Type 2 diabetes mellitus with other diabetic kidney complication: Secondary | ICD-10-CM | POA: Diagnosis not present

## 2015-06-11 DIAGNOSIS — I10 Essential (primary) hypertension: Secondary | ICD-10-CM | POA: Diagnosis not present

## 2015-06-11 DIAGNOSIS — G2581 Restless legs syndrome: Secondary | ICD-10-CM | POA: Diagnosis not present

## 2015-06-11 DIAGNOSIS — N2581 Secondary hyperparathyroidism of renal origin: Secondary | ICD-10-CM | POA: Diagnosis not present

## 2015-06-11 DIAGNOSIS — E213 Hyperparathyroidism, unspecified: Secondary | ICD-10-CM | POA: Diagnosis not present

## 2015-06-11 DIAGNOSIS — E785 Hyperlipidemia, unspecified: Secondary | ICD-10-CM | POA: Diagnosis not present

## 2015-06-11 DIAGNOSIS — D631 Anemia in chronic kidney disease: Secondary | ICD-10-CM | POA: Diagnosis not present

## 2015-06-11 DIAGNOSIS — I69954 Hemiplegia and hemiparesis following unspecified cerebrovascular disease affecting left non-dominant side: Secondary | ICD-10-CM | POA: Diagnosis not present

## 2015-06-11 DIAGNOSIS — I509 Heart failure, unspecified: Secondary | ICD-10-CM | POA: Diagnosis not present

## 2015-06-11 DIAGNOSIS — H409 Unspecified glaucoma: Secondary | ICD-10-CM | POA: Diagnosis not present

## 2015-06-11 DIAGNOSIS — D509 Iron deficiency anemia, unspecified: Secondary | ICD-10-CM | POA: Diagnosis not present

## 2015-06-12 DIAGNOSIS — Z79899 Other long term (current) drug therapy: Secondary | ICD-10-CM | POA: Diagnosis not present

## 2015-06-12 DIAGNOSIS — Z7901 Long term (current) use of anticoagulants: Secondary | ICD-10-CM | POA: Diagnosis not present

## 2015-06-13 DIAGNOSIS — N2581 Secondary hyperparathyroidism of renal origin: Secondary | ICD-10-CM | POA: Diagnosis not present

## 2015-06-13 DIAGNOSIS — N186 End stage renal disease: Secondary | ICD-10-CM | POA: Diagnosis not present

## 2015-06-13 DIAGNOSIS — E1129 Type 2 diabetes mellitus with other diabetic kidney complication: Secondary | ICD-10-CM | POA: Diagnosis not present

## 2015-06-13 DIAGNOSIS — D631 Anemia in chronic kidney disease: Secondary | ICD-10-CM | POA: Diagnosis not present

## 2015-06-13 DIAGNOSIS — D509 Iron deficiency anemia, unspecified: Secondary | ICD-10-CM | POA: Diagnosis not present

## 2015-06-14 DIAGNOSIS — E1129 Type 2 diabetes mellitus with other diabetic kidney complication: Secondary | ICD-10-CM | POA: Diagnosis not present

## 2015-06-14 DIAGNOSIS — Z992 Dependence on renal dialysis: Secondary | ICD-10-CM | POA: Diagnosis not present

## 2015-06-14 DIAGNOSIS — N186 End stage renal disease: Secondary | ICD-10-CM | POA: Diagnosis not present

## 2015-06-18 DIAGNOSIS — N2581 Secondary hyperparathyroidism of renal origin: Secondary | ICD-10-CM | POA: Diagnosis not present

## 2015-06-18 DIAGNOSIS — D509 Iron deficiency anemia, unspecified: Secondary | ICD-10-CM | POA: Diagnosis not present

## 2015-06-18 DIAGNOSIS — N186 End stage renal disease: Secondary | ICD-10-CM | POA: Diagnosis not present

## 2015-06-18 DIAGNOSIS — D631 Anemia in chronic kidney disease: Secondary | ICD-10-CM | POA: Diagnosis not present

## 2015-06-18 DIAGNOSIS — E1129 Type 2 diabetes mellitus with other diabetic kidney complication: Secondary | ICD-10-CM | POA: Diagnosis not present

## 2015-06-20 DIAGNOSIS — D509 Iron deficiency anemia, unspecified: Secondary | ICD-10-CM | POA: Diagnosis not present

## 2015-06-20 DIAGNOSIS — N2581 Secondary hyperparathyroidism of renal origin: Secondary | ICD-10-CM | POA: Diagnosis not present

## 2015-06-20 DIAGNOSIS — D631 Anemia in chronic kidney disease: Secondary | ICD-10-CM | POA: Diagnosis not present

## 2015-06-20 DIAGNOSIS — E1129 Type 2 diabetes mellitus with other diabetic kidney complication: Secondary | ICD-10-CM | POA: Diagnosis not present

## 2015-06-20 DIAGNOSIS — N186 End stage renal disease: Secondary | ICD-10-CM | POA: Diagnosis not present

## 2015-06-22 DIAGNOSIS — N2581 Secondary hyperparathyroidism of renal origin: Secondary | ICD-10-CM | POA: Diagnosis not present

## 2015-06-22 DIAGNOSIS — N186 End stage renal disease: Secondary | ICD-10-CM | POA: Diagnosis not present

## 2015-06-22 DIAGNOSIS — D509 Iron deficiency anemia, unspecified: Secondary | ICD-10-CM | POA: Diagnosis not present

## 2015-06-22 DIAGNOSIS — D631 Anemia in chronic kidney disease: Secondary | ICD-10-CM | POA: Diagnosis not present

## 2015-06-22 DIAGNOSIS — E1129 Type 2 diabetes mellitus with other diabetic kidney complication: Secondary | ICD-10-CM | POA: Diagnosis not present

## 2015-06-25 DIAGNOSIS — N2581 Secondary hyperparathyroidism of renal origin: Secondary | ICD-10-CM | POA: Diagnosis not present

## 2015-06-25 DIAGNOSIS — E1129 Type 2 diabetes mellitus with other diabetic kidney complication: Secondary | ICD-10-CM | POA: Diagnosis not present

## 2015-06-25 DIAGNOSIS — N186 End stage renal disease: Secondary | ICD-10-CM | POA: Diagnosis not present

## 2015-06-25 DIAGNOSIS — D509 Iron deficiency anemia, unspecified: Secondary | ICD-10-CM | POA: Diagnosis not present

## 2015-06-25 DIAGNOSIS — D631 Anemia in chronic kidney disease: Secondary | ICD-10-CM | POA: Diagnosis not present

## 2015-06-27 DIAGNOSIS — N186 End stage renal disease: Secondary | ICD-10-CM | POA: Diagnosis not present

## 2015-06-27 DIAGNOSIS — D509 Iron deficiency anemia, unspecified: Secondary | ICD-10-CM | POA: Diagnosis not present

## 2015-06-27 DIAGNOSIS — D631 Anemia in chronic kidney disease: Secondary | ICD-10-CM | POA: Diagnosis not present

## 2015-06-27 DIAGNOSIS — E1129 Type 2 diabetes mellitus with other diabetic kidney complication: Secondary | ICD-10-CM | POA: Diagnosis not present

## 2015-06-27 DIAGNOSIS — N2581 Secondary hyperparathyroidism of renal origin: Secondary | ICD-10-CM | POA: Diagnosis not present

## 2015-06-29 DIAGNOSIS — D509 Iron deficiency anemia, unspecified: Secondary | ICD-10-CM | POA: Diagnosis not present

## 2015-06-29 DIAGNOSIS — D631 Anemia in chronic kidney disease: Secondary | ICD-10-CM | POA: Diagnosis not present

## 2015-06-29 DIAGNOSIS — E1129 Type 2 diabetes mellitus with other diabetic kidney complication: Secondary | ICD-10-CM | POA: Diagnosis not present

## 2015-06-29 DIAGNOSIS — N2581 Secondary hyperparathyroidism of renal origin: Secondary | ICD-10-CM | POA: Diagnosis not present

## 2015-06-29 DIAGNOSIS — N186 End stage renal disease: Secondary | ICD-10-CM | POA: Diagnosis not present

## 2015-07-02 DIAGNOSIS — E1129 Type 2 diabetes mellitus with other diabetic kidney complication: Secondary | ICD-10-CM | POA: Diagnosis not present

## 2015-07-02 DIAGNOSIS — D631 Anemia in chronic kidney disease: Secondary | ICD-10-CM | POA: Diagnosis not present

## 2015-07-02 DIAGNOSIS — N2581 Secondary hyperparathyroidism of renal origin: Secondary | ICD-10-CM | POA: Diagnosis not present

## 2015-07-02 DIAGNOSIS — N186 End stage renal disease: Secondary | ICD-10-CM | POA: Diagnosis not present

## 2015-07-02 DIAGNOSIS — D509 Iron deficiency anemia, unspecified: Secondary | ICD-10-CM | POA: Diagnosis not present

## 2015-07-04 ENCOUNTER — Ambulatory Visit: Payer: Medicare Other | Admitting: Sports Medicine

## 2015-07-04 DIAGNOSIS — N2581 Secondary hyperparathyroidism of renal origin: Secondary | ICD-10-CM | POA: Diagnosis not present

## 2015-07-04 DIAGNOSIS — D631 Anemia in chronic kidney disease: Secondary | ICD-10-CM | POA: Diagnosis not present

## 2015-07-04 DIAGNOSIS — D509 Iron deficiency anemia, unspecified: Secondary | ICD-10-CM | POA: Diagnosis not present

## 2015-07-04 DIAGNOSIS — N186 End stage renal disease: Secondary | ICD-10-CM | POA: Diagnosis not present

## 2015-07-04 DIAGNOSIS — E1129 Type 2 diabetes mellitus with other diabetic kidney complication: Secondary | ICD-10-CM | POA: Diagnosis not present

## 2015-07-06 DIAGNOSIS — D509 Iron deficiency anemia, unspecified: Secondary | ICD-10-CM | POA: Diagnosis not present

## 2015-07-06 DIAGNOSIS — D631 Anemia in chronic kidney disease: Secondary | ICD-10-CM | POA: Diagnosis not present

## 2015-07-06 DIAGNOSIS — N2581 Secondary hyperparathyroidism of renal origin: Secondary | ICD-10-CM | POA: Diagnosis not present

## 2015-07-06 DIAGNOSIS — E1129 Type 2 diabetes mellitus with other diabetic kidney complication: Secondary | ICD-10-CM | POA: Diagnosis not present

## 2015-07-06 DIAGNOSIS — N186 End stage renal disease: Secondary | ICD-10-CM | POA: Diagnosis not present

## 2015-07-09 DIAGNOSIS — D631 Anemia in chronic kidney disease: Secondary | ICD-10-CM | POA: Diagnosis not present

## 2015-07-09 DIAGNOSIS — D509 Iron deficiency anemia, unspecified: Secondary | ICD-10-CM | POA: Diagnosis not present

## 2015-07-09 DIAGNOSIS — E1129 Type 2 diabetes mellitus with other diabetic kidney complication: Secondary | ICD-10-CM | POA: Diagnosis not present

## 2015-07-09 DIAGNOSIS — N2581 Secondary hyperparathyroidism of renal origin: Secondary | ICD-10-CM | POA: Diagnosis not present

## 2015-07-09 DIAGNOSIS — N186 End stage renal disease: Secondary | ICD-10-CM | POA: Diagnosis not present

## 2015-07-10 DIAGNOSIS — M79673 Pain in unspecified foot: Secondary | ICD-10-CM | POA: Diagnosis not present

## 2015-07-10 DIAGNOSIS — B351 Tinea unguium: Secondary | ICD-10-CM | POA: Diagnosis not present

## 2015-07-10 DIAGNOSIS — B070 Plantar wart: Secondary | ICD-10-CM | POA: Diagnosis not present

## 2015-07-10 DIAGNOSIS — L851 Acquired keratosis [keratoderma] palmaris et plantaris: Secondary | ICD-10-CM | POA: Diagnosis not present

## 2015-07-11 DIAGNOSIS — N2581 Secondary hyperparathyroidism of renal origin: Secondary | ICD-10-CM | POA: Diagnosis not present

## 2015-07-11 DIAGNOSIS — D631 Anemia in chronic kidney disease: Secondary | ICD-10-CM | POA: Diagnosis not present

## 2015-07-11 DIAGNOSIS — E1129 Type 2 diabetes mellitus with other diabetic kidney complication: Secondary | ICD-10-CM | POA: Diagnosis not present

## 2015-07-11 DIAGNOSIS — D509 Iron deficiency anemia, unspecified: Secondary | ICD-10-CM | POA: Diagnosis not present

## 2015-07-11 DIAGNOSIS — N186 End stage renal disease: Secondary | ICD-10-CM | POA: Diagnosis not present

## 2015-07-13 DIAGNOSIS — N186 End stage renal disease: Secondary | ICD-10-CM | POA: Diagnosis not present

## 2015-07-13 DIAGNOSIS — E1129 Type 2 diabetes mellitus with other diabetic kidney complication: Secondary | ICD-10-CM | POA: Diagnosis not present

## 2015-07-13 DIAGNOSIS — N2581 Secondary hyperparathyroidism of renal origin: Secondary | ICD-10-CM | POA: Diagnosis not present

## 2015-07-13 DIAGNOSIS — D509 Iron deficiency anemia, unspecified: Secondary | ICD-10-CM | POA: Diagnosis not present

## 2015-07-13 DIAGNOSIS — D631 Anemia in chronic kidney disease: Secondary | ICD-10-CM | POA: Diagnosis not present

## 2015-07-14 DIAGNOSIS — Z992 Dependence on renal dialysis: Secondary | ICD-10-CM | POA: Diagnosis not present

## 2015-07-14 DIAGNOSIS — N186 End stage renal disease: Secondary | ICD-10-CM | POA: Diagnosis not present

## 2015-07-14 DIAGNOSIS — E1129 Type 2 diabetes mellitus with other diabetic kidney complication: Secondary | ICD-10-CM | POA: Diagnosis not present

## 2015-07-16 DIAGNOSIS — E213 Hyperparathyroidism, unspecified: Secondary | ICD-10-CM | POA: Diagnosis not present

## 2015-07-16 DIAGNOSIS — H409 Unspecified glaucoma: Secondary | ICD-10-CM | POA: Diagnosis not present

## 2015-07-16 DIAGNOSIS — E785 Hyperlipidemia, unspecified: Secondary | ICD-10-CM | POA: Diagnosis not present

## 2015-07-16 DIAGNOSIS — N186 End stage renal disease: Secondary | ICD-10-CM | POA: Diagnosis not present

## 2015-07-16 DIAGNOSIS — N2581 Secondary hyperparathyroidism of renal origin: Secondary | ICD-10-CM | POA: Diagnosis not present

## 2015-07-16 DIAGNOSIS — I10 Essential (primary) hypertension: Secondary | ICD-10-CM | POA: Diagnosis not present

## 2015-07-16 DIAGNOSIS — I69954 Hemiplegia and hemiparesis following unspecified cerebrovascular disease affecting left non-dominant side: Secondary | ICD-10-CM | POA: Diagnosis not present

## 2015-07-16 DIAGNOSIS — D509 Iron deficiency anemia, unspecified: Secondary | ICD-10-CM | POA: Diagnosis not present

## 2015-07-16 DIAGNOSIS — E1129 Type 2 diabetes mellitus with other diabetic kidney complication: Secondary | ICD-10-CM | POA: Diagnosis not present

## 2015-07-16 DIAGNOSIS — G2581 Restless legs syndrome: Secondary | ICD-10-CM | POA: Diagnosis not present

## 2015-07-16 DIAGNOSIS — I509 Heart failure, unspecified: Secondary | ICD-10-CM | POA: Diagnosis not present

## 2015-07-17 DIAGNOSIS — Z7901 Long term (current) use of anticoagulants: Secondary | ICD-10-CM | POA: Diagnosis not present

## 2015-07-17 DIAGNOSIS — Z79899 Other long term (current) drug therapy: Secondary | ICD-10-CM | POA: Diagnosis not present

## 2015-07-18 DIAGNOSIS — E1129 Type 2 diabetes mellitus with other diabetic kidney complication: Secondary | ICD-10-CM | POA: Diagnosis not present

## 2015-07-18 DIAGNOSIS — N2581 Secondary hyperparathyroidism of renal origin: Secondary | ICD-10-CM | POA: Diagnosis not present

## 2015-07-18 DIAGNOSIS — N186 End stage renal disease: Secondary | ICD-10-CM | POA: Diagnosis not present

## 2015-07-18 DIAGNOSIS — D509 Iron deficiency anemia, unspecified: Secondary | ICD-10-CM | POA: Diagnosis not present

## 2015-07-20 DIAGNOSIS — E1129 Type 2 diabetes mellitus with other diabetic kidney complication: Secondary | ICD-10-CM | POA: Diagnosis not present

## 2015-07-20 DIAGNOSIS — N2581 Secondary hyperparathyroidism of renal origin: Secondary | ICD-10-CM | POA: Diagnosis not present

## 2015-07-20 DIAGNOSIS — D509 Iron deficiency anemia, unspecified: Secondary | ICD-10-CM | POA: Diagnosis not present

## 2015-07-20 DIAGNOSIS — N186 End stage renal disease: Secondary | ICD-10-CM | POA: Diagnosis not present

## 2015-07-23 DIAGNOSIS — N2581 Secondary hyperparathyroidism of renal origin: Secondary | ICD-10-CM | POA: Diagnosis not present

## 2015-07-23 DIAGNOSIS — D509 Iron deficiency anemia, unspecified: Secondary | ICD-10-CM | POA: Diagnosis not present

## 2015-07-23 DIAGNOSIS — N186 End stage renal disease: Secondary | ICD-10-CM | POA: Diagnosis not present

## 2015-07-23 DIAGNOSIS — E1129 Type 2 diabetes mellitus with other diabetic kidney complication: Secondary | ICD-10-CM | POA: Diagnosis not present

## 2015-07-24 DIAGNOSIS — Z7901 Long term (current) use of anticoagulants: Secondary | ICD-10-CM | POA: Diagnosis not present

## 2015-07-25 DIAGNOSIS — N186 End stage renal disease: Secondary | ICD-10-CM | POA: Diagnosis not present

## 2015-07-25 DIAGNOSIS — N2581 Secondary hyperparathyroidism of renal origin: Secondary | ICD-10-CM | POA: Diagnosis not present

## 2015-07-25 DIAGNOSIS — E1129 Type 2 diabetes mellitus with other diabetic kidney complication: Secondary | ICD-10-CM | POA: Diagnosis not present

## 2015-07-25 DIAGNOSIS — D509 Iron deficiency anemia, unspecified: Secondary | ICD-10-CM | POA: Diagnosis not present

## 2015-07-26 DIAGNOSIS — M170 Bilateral primary osteoarthritis of knee: Secondary | ICD-10-CM | POA: Diagnosis not present

## 2015-07-26 DIAGNOSIS — M25561 Pain in right knee: Secondary | ICD-10-CM | POA: Diagnosis not present

## 2015-07-26 DIAGNOSIS — M1712 Unilateral primary osteoarthritis, left knee: Secondary | ICD-10-CM | POA: Diagnosis not present

## 2015-07-26 DIAGNOSIS — G8929 Other chronic pain: Secondary | ICD-10-CM | POA: Diagnosis not present

## 2015-07-26 DIAGNOSIS — M1711 Unilateral primary osteoarthritis, right knee: Secondary | ICD-10-CM | POA: Diagnosis not present

## 2015-07-27 DIAGNOSIS — D509 Iron deficiency anemia, unspecified: Secondary | ICD-10-CM | POA: Diagnosis not present

## 2015-07-27 DIAGNOSIS — N186 End stage renal disease: Secondary | ICD-10-CM | POA: Diagnosis not present

## 2015-07-27 DIAGNOSIS — E1129 Type 2 diabetes mellitus with other diabetic kidney complication: Secondary | ICD-10-CM | POA: Diagnosis not present

## 2015-07-27 DIAGNOSIS — N2581 Secondary hyperparathyroidism of renal origin: Secondary | ICD-10-CM | POA: Diagnosis not present

## 2015-07-30 DIAGNOSIS — E1129 Type 2 diabetes mellitus with other diabetic kidney complication: Secondary | ICD-10-CM | POA: Diagnosis not present

## 2015-07-30 DIAGNOSIS — N186 End stage renal disease: Secondary | ICD-10-CM | POA: Diagnosis not present

## 2015-07-30 DIAGNOSIS — N2581 Secondary hyperparathyroidism of renal origin: Secondary | ICD-10-CM | POA: Diagnosis not present

## 2015-07-30 DIAGNOSIS — D509 Iron deficiency anemia, unspecified: Secondary | ICD-10-CM | POA: Diagnosis not present

## 2015-07-31 DIAGNOSIS — Z7901 Long term (current) use of anticoagulants: Secondary | ICD-10-CM | POA: Diagnosis not present

## 2015-08-01 DIAGNOSIS — N186 End stage renal disease: Secondary | ICD-10-CM | POA: Diagnosis not present

## 2015-08-01 DIAGNOSIS — E1129 Type 2 diabetes mellitus with other diabetic kidney complication: Secondary | ICD-10-CM | POA: Diagnosis not present

## 2015-08-01 DIAGNOSIS — D509 Iron deficiency anemia, unspecified: Secondary | ICD-10-CM | POA: Diagnosis not present

## 2015-08-01 DIAGNOSIS — N2581 Secondary hyperparathyroidism of renal origin: Secondary | ICD-10-CM | POA: Diagnosis not present

## 2015-08-03 DIAGNOSIS — N186 End stage renal disease: Secondary | ICD-10-CM | POA: Diagnosis not present

## 2015-08-03 DIAGNOSIS — E1129 Type 2 diabetes mellitus with other diabetic kidney complication: Secondary | ICD-10-CM | POA: Diagnosis not present

## 2015-08-03 DIAGNOSIS — D509 Iron deficiency anemia, unspecified: Secondary | ICD-10-CM | POA: Diagnosis not present

## 2015-08-03 DIAGNOSIS — N2581 Secondary hyperparathyroidism of renal origin: Secondary | ICD-10-CM | POA: Diagnosis not present

## 2015-08-06 DIAGNOSIS — N2581 Secondary hyperparathyroidism of renal origin: Secondary | ICD-10-CM | POA: Diagnosis not present

## 2015-08-06 DIAGNOSIS — N186 End stage renal disease: Secondary | ICD-10-CM | POA: Diagnosis not present

## 2015-08-06 DIAGNOSIS — E1129 Type 2 diabetes mellitus with other diabetic kidney complication: Secondary | ICD-10-CM | POA: Diagnosis not present

## 2015-08-06 DIAGNOSIS — D509 Iron deficiency anemia, unspecified: Secondary | ICD-10-CM | POA: Diagnosis not present

## 2015-08-07 DIAGNOSIS — Z7901 Long term (current) use of anticoagulants: Secondary | ICD-10-CM | POA: Diagnosis not present

## 2015-08-08 DIAGNOSIS — N186 End stage renal disease: Secondary | ICD-10-CM | POA: Diagnosis not present

## 2015-08-08 DIAGNOSIS — D509 Iron deficiency anemia, unspecified: Secondary | ICD-10-CM | POA: Diagnosis not present

## 2015-08-08 DIAGNOSIS — E1129 Type 2 diabetes mellitus with other diabetic kidney complication: Secondary | ICD-10-CM | POA: Diagnosis not present

## 2015-08-08 DIAGNOSIS — N2581 Secondary hyperparathyroidism of renal origin: Secondary | ICD-10-CM | POA: Diagnosis not present

## 2015-08-10 DIAGNOSIS — N186 End stage renal disease: Secondary | ICD-10-CM | POA: Diagnosis not present

## 2015-08-10 DIAGNOSIS — D509 Iron deficiency anemia, unspecified: Secondary | ICD-10-CM | POA: Diagnosis not present

## 2015-08-10 DIAGNOSIS — E1129 Type 2 diabetes mellitus with other diabetic kidney complication: Secondary | ICD-10-CM | POA: Diagnosis not present

## 2015-08-10 DIAGNOSIS — N2581 Secondary hyperparathyroidism of renal origin: Secondary | ICD-10-CM | POA: Diagnosis not present

## 2015-08-13 DIAGNOSIS — N2581 Secondary hyperparathyroidism of renal origin: Secondary | ICD-10-CM | POA: Diagnosis not present

## 2015-08-13 DIAGNOSIS — D509 Iron deficiency anemia, unspecified: Secondary | ICD-10-CM | POA: Diagnosis not present

## 2015-08-13 DIAGNOSIS — N186 End stage renal disease: Secondary | ICD-10-CM | POA: Diagnosis not present

## 2015-08-13 DIAGNOSIS — E1129 Type 2 diabetes mellitus with other diabetic kidney complication: Secondary | ICD-10-CM | POA: Diagnosis not present

## 2015-08-14 DIAGNOSIS — N186 End stage renal disease: Secondary | ICD-10-CM | POA: Diagnosis not present

## 2015-08-14 DIAGNOSIS — E1129 Type 2 diabetes mellitus with other diabetic kidney complication: Secondary | ICD-10-CM | POA: Diagnosis not present

## 2015-08-14 DIAGNOSIS — Z992 Dependence on renal dialysis: Secondary | ICD-10-CM | POA: Diagnosis not present

## 2015-08-14 DIAGNOSIS — Z7901 Long term (current) use of anticoagulants: Secondary | ICD-10-CM | POA: Diagnosis not present

## 2015-08-15 DIAGNOSIS — D631 Anemia in chronic kidney disease: Secondary | ICD-10-CM | POA: Diagnosis not present

## 2015-08-15 DIAGNOSIS — D509 Iron deficiency anemia, unspecified: Secondary | ICD-10-CM | POA: Diagnosis not present

## 2015-08-15 DIAGNOSIS — E1129 Type 2 diabetes mellitus with other diabetic kidney complication: Secondary | ICD-10-CM | POA: Diagnosis not present

## 2015-08-15 DIAGNOSIS — N186 End stage renal disease: Secondary | ICD-10-CM | POA: Diagnosis not present

## 2015-08-15 DIAGNOSIS — N2581 Secondary hyperparathyroidism of renal origin: Secondary | ICD-10-CM | POA: Diagnosis not present

## 2015-08-17 DIAGNOSIS — D509 Iron deficiency anemia, unspecified: Secondary | ICD-10-CM | POA: Diagnosis not present

## 2015-08-17 DIAGNOSIS — N186 End stage renal disease: Secondary | ICD-10-CM | POA: Diagnosis not present

## 2015-08-17 DIAGNOSIS — E1129 Type 2 diabetes mellitus with other diabetic kidney complication: Secondary | ICD-10-CM | POA: Diagnosis not present

## 2015-08-17 DIAGNOSIS — N2581 Secondary hyperparathyroidism of renal origin: Secondary | ICD-10-CM | POA: Diagnosis not present

## 2015-08-17 DIAGNOSIS — D631 Anemia in chronic kidney disease: Secondary | ICD-10-CM | POA: Diagnosis not present

## 2015-08-20 DIAGNOSIS — D631 Anemia in chronic kidney disease: Secondary | ICD-10-CM | POA: Diagnosis not present

## 2015-08-20 DIAGNOSIS — E1129 Type 2 diabetes mellitus with other diabetic kidney complication: Secondary | ICD-10-CM | POA: Diagnosis not present

## 2015-08-20 DIAGNOSIS — D509 Iron deficiency anemia, unspecified: Secondary | ICD-10-CM | POA: Diagnosis not present

## 2015-08-20 DIAGNOSIS — N2581 Secondary hyperparathyroidism of renal origin: Secondary | ICD-10-CM | POA: Diagnosis not present

## 2015-08-20 DIAGNOSIS — N186 End stage renal disease: Secondary | ICD-10-CM | POA: Diagnosis not present

## 2015-08-22 DIAGNOSIS — N2581 Secondary hyperparathyroidism of renal origin: Secondary | ICD-10-CM | POA: Diagnosis not present

## 2015-08-22 DIAGNOSIS — N186 End stage renal disease: Secondary | ICD-10-CM | POA: Diagnosis not present

## 2015-08-22 DIAGNOSIS — D509 Iron deficiency anemia, unspecified: Secondary | ICD-10-CM | POA: Diagnosis not present

## 2015-08-22 DIAGNOSIS — D631 Anemia in chronic kidney disease: Secondary | ICD-10-CM | POA: Diagnosis not present

## 2015-08-22 DIAGNOSIS — E1129 Type 2 diabetes mellitus with other diabetic kidney complication: Secondary | ICD-10-CM | POA: Diagnosis not present

## 2015-08-24 DIAGNOSIS — N2581 Secondary hyperparathyroidism of renal origin: Secondary | ICD-10-CM | POA: Diagnosis not present

## 2015-08-24 DIAGNOSIS — N186 End stage renal disease: Secondary | ICD-10-CM | POA: Diagnosis not present

## 2015-08-24 DIAGNOSIS — D509 Iron deficiency anemia, unspecified: Secondary | ICD-10-CM | POA: Diagnosis not present

## 2015-08-24 DIAGNOSIS — E1129 Type 2 diabetes mellitus with other diabetic kidney complication: Secondary | ICD-10-CM | POA: Diagnosis not present

## 2015-08-24 DIAGNOSIS — D631 Anemia in chronic kidney disease: Secondary | ICD-10-CM | POA: Diagnosis not present

## 2015-08-26 DIAGNOSIS — Z7901 Long term (current) use of anticoagulants: Secondary | ICD-10-CM | POA: Diagnosis not present

## 2015-08-27 DIAGNOSIS — I10 Essential (primary) hypertension: Secondary | ICD-10-CM | POA: Diagnosis not present

## 2015-08-27 DIAGNOSIS — E1129 Type 2 diabetes mellitus with other diabetic kidney complication: Secondary | ICD-10-CM | POA: Diagnosis not present

## 2015-08-27 DIAGNOSIS — E213 Hyperparathyroidism, unspecified: Secondary | ICD-10-CM | POA: Diagnosis not present

## 2015-08-27 DIAGNOSIS — D509 Iron deficiency anemia, unspecified: Secondary | ICD-10-CM | POA: Diagnosis not present

## 2015-08-27 DIAGNOSIS — H409 Unspecified glaucoma: Secondary | ICD-10-CM | POA: Diagnosis not present

## 2015-08-27 DIAGNOSIS — Z86718 Personal history of other venous thrombosis and embolism: Secondary | ICD-10-CM | POA: Diagnosis not present

## 2015-08-27 DIAGNOSIS — N2581 Secondary hyperparathyroidism of renal origin: Secondary | ICD-10-CM | POA: Diagnosis not present

## 2015-08-27 DIAGNOSIS — E785 Hyperlipidemia, unspecified: Secondary | ICD-10-CM | POA: Diagnosis not present

## 2015-08-27 DIAGNOSIS — N186 End stage renal disease: Secondary | ICD-10-CM | POA: Diagnosis not present

## 2015-08-27 DIAGNOSIS — I509 Heart failure, unspecified: Secondary | ICD-10-CM | POA: Diagnosis not present

## 2015-08-27 DIAGNOSIS — D631 Anemia in chronic kidney disease: Secondary | ICD-10-CM | POA: Diagnosis not present

## 2015-08-27 DIAGNOSIS — E119 Type 2 diabetes mellitus without complications: Secondary | ICD-10-CM | POA: Diagnosis not present

## 2015-08-29 DIAGNOSIS — N186 End stage renal disease: Secondary | ICD-10-CM | POA: Diagnosis not present

## 2015-08-29 DIAGNOSIS — N2581 Secondary hyperparathyroidism of renal origin: Secondary | ICD-10-CM | POA: Diagnosis not present

## 2015-08-29 DIAGNOSIS — E1129 Type 2 diabetes mellitus with other diabetic kidney complication: Secondary | ICD-10-CM | POA: Diagnosis not present

## 2015-08-29 DIAGNOSIS — D631 Anemia in chronic kidney disease: Secondary | ICD-10-CM | POA: Diagnosis not present

## 2015-08-29 DIAGNOSIS — D509 Iron deficiency anemia, unspecified: Secondary | ICD-10-CM | POA: Diagnosis not present

## 2015-08-31 DIAGNOSIS — D509 Iron deficiency anemia, unspecified: Secondary | ICD-10-CM | POA: Diagnosis not present

## 2015-08-31 DIAGNOSIS — D631 Anemia in chronic kidney disease: Secondary | ICD-10-CM | POA: Diagnosis not present

## 2015-08-31 DIAGNOSIS — E1129 Type 2 diabetes mellitus with other diabetic kidney complication: Secondary | ICD-10-CM | POA: Diagnosis not present

## 2015-08-31 DIAGNOSIS — N2581 Secondary hyperparathyroidism of renal origin: Secondary | ICD-10-CM | POA: Diagnosis not present

## 2015-08-31 DIAGNOSIS — N186 End stage renal disease: Secondary | ICD-10-CM | POA: Diagnosis not present

## 2015-09-01 ENCOUNTER — Encounter (HOSPITAL_COMMUNITY): Payer: Self-pay

## 2015-09-01 ENCOUNTER — Other Ambulatory Visit: Payer: Self-pay

## 2015-09-01 ENCOUNTER — Emergency Department (HOSPITAL_COMMUNITY): Payer: Medicare Other

## 2015-09-01 ENCOUNTER — Emergency Department (HOSPITAL_COMMUNITY)
Admission: EM | Admit: 2015-09-01 | Discharge: 2015-09-02 | Disposition: A | Discharge status: Home / Self Care | Payer: Medicare Other | Attending: Emergency Medicine | Admitting: Emergency Medicine

## 2015-09-01 DIAGNOSIS — I2510 Atherosclerotic heart disease of native coronary artery without angina pectoris: Secondary | ICD-10-CM | POA: Insufficient documentation

## 2015-09-01 DIAGNOSIS — Z794 Long term (current) use of insulin: Secondary | ICD-10-CM | POA: Diagnosis not present

## 2015-09-01 DIAGNOSIS — Z7901 Long term (current) use of anticoagulants: Secondary | ICD-10-CM | POA: Diagnosis not present

## 2015-09-01 DIAGNOSIS — K5900 Constipation, unspecified: Secondary | ICD-10-CM | POA: Diagnosis not present

## 2015-09-01 DIAGNOSIS — R10812 Left upper quadrant abdominal tenderness: Secondary | ICD-10-CM | POA: Diagnosis not present

## 2015-09-01 DIAGNOSIS — Z79899 Other long term (current) drug therapy: Secondary | ICD-10-CM | POA: Diagnosis not present

## 2015-09-01 DIAGNOSIS — I509 Heart failure, unspecified: Secondary | ICD-10-CM | POA: Diagnosis not present

## 2015-09-01 DIAGNOSIS — I132 Hypertensive heart and chronic kidney disease with heart failure and with stage 5 chronic kidney disease, or end stage renal disease: Secondary | ICD-10-CM | POA: Diagnosis not present

## 2015-09-01 DIAGNOSIS — K2970 Gastritis, unspecified, without bleeding: Secondary | ICD-10-CM | POA: Diagnosis not present

## 2015-09-01 DIAGNOSIS — R112 Nausea with vomiting, unspecified: Secondary | ICD-10-CM | POA: Diagnosis not present

## 2015-09-01 DIAGNOSIS — Z87891 Personal history of nicotine dependence: Secondary | ICD-10-CM | POA: Insufficient documentation

## 2015-09-01 DIAGNOSIS — R1010 Upper abdominal pain, unspecified: Secondary | ICD-10-CM | POA: Diagnosis not present

## 2015-09-01 DIAGNOSIS — R1013 Epigastric pain: Secondary | ICD-10-CM | POA: Diagnosis not present

## 2015-09-01 DIAGNOSIS — N186 End stage renal disease: Secondary | ICD-10-CM | POA: Insufficient documentation

## 2015-09-01 DIAGNOSIS — I120 Hypertensive chronic kidney disease with stage 5 chronic kidney disease or end stage renal disease: Secondary | ICD-10-CM | POA: Diagnosis not present

## 2015-09-01 DIAGNOSIS — Z8673 Personal history of transient ischemic attack (TIA), and cerebral infarction without residual deficits: Secondary | ICD-10-CM | POA: Insufficient documentation

## 2015-09-01 DIAGNOSIS — E1122 Type 2 diabetes mellitus with diabetic chronic kidney disease: Secondary | ICD-10-CM | POA: Insufficient documentation

## 2015-09-01 HISTORY — DX: Constipation, unspecified: K59.00

## 2015-09-01 LAB — CBC WITH DIFFERENTIAL/PLATELET
BASOS ABS: 0 10*3/uL (ref 0.0–0.1)
BASOS PCT: 0 %
Eosinophils Absolute: 0 10*3/uL (ref 0.0–0.7)
Eosinophils Relative: 1 %
HEMATOCRIT: 31.6 % — AB (ref 36.0–46.0)
HEMOGLOBIN: 10 g/dL — AB (ref 12.0–15.0)
Lymphocytes Relative: 35 %
Lymphs Abs: 1.6 10*3/uL (ref 0.7–4.0)
MCH: 30.5 pg (ref 26.0–34.0)
MCHC: 31.6 g/dL (ref 30.0–36.0)
MCV: 96.3 fL (ref 78.0–100.0)
Monocytes Absolute: 0.4 10*3/uL (ref 0.1–1.0)
Monocytes Relative: 9 %
NEUTROS ABS: 2.5 10*3/uL (ref 1.7–7.7)
NEUTROS PCT: 55 %
Platelets: 123 10*3/uL — ABNORMAL LOW (ref 150–400)
RBC: 3.28 MIL/uL — ABNORMAL LOW (ref 3.87–5.11)
RDW: 15 % (ref 11.5–15.5)
WBC: 4.5 10*3/uL (ref 4.0–10.5)

## 2015-09-01 NOTE — ED Notes (Signed)
Patient transported to Ultrasound 

## 2015-09-01 NOTE — ED Notes (Signed)
Patient transported to X-ray 

## 2015-09-01 NOTE — ED Notes (Signed)
Nehemiah Settle PA at bedside

## 2015-09-01 NOTE — ED Provider Notes (Signed)
CSN: [id number]     Arrival date & time 09/01/15  2216 History   First MD Initiated Contact with Patient 09/01/15 2225     Chief Complaint  Patient presents with  . Abdominal Pain     (Consider location/radiation/quality/duration/timing/severity/associated sxs/prior Treatment) HPI Comments: Patient with a history of HTN, ESRD-HD (T, Th, Sat), CHF, HLD, DM, DVT (on Coumadin) presents with complaint of abdominal pain that started 4 days ago, limited to the epigastric region. Nausea with vomiting x 1, no fever. She has a history of constipation and feels this pain is similar to her last episode. She was given a suppository at [facility] where she lives and passed a small amount of gas but no stool. She reports her last bowel movement was 4 days ago.  Patient is a 70 y.o. female presenting with abdominal pain. The history is provided by the patient. No language interpreter was used.  Abdominal Pain Pain location:  Epigastric Pain quality: sharp   Pain severity:  Moderate Duration:  4 days Associated symptoms: constipation, nausea and vomiting   Associated symptoms: no chest pain, no chills, no fever and no shortness of breath     Past Medical History  Diagnosis Date  . Hypertension   . Hyperlipidemia   . Diabetes mellitus   . CVA (cerebral vascular accident) ([place]) [place]    left sided weakness  . Protein malnutrition ([place])   . Secondary hyperparathyroidism ([place])   . Anemia   . End stage renal disease ([place])     initiated HD 2006  . Hx of echocardiogram     a. Echo 03/2010: EF 55-60%, Gr 1 diast dysfn, trivial MS, mild to mod LAE, PASP 34, ;  b. Echo 11/13: mild LVH, EF 60%, Gr 1 diast dysfn, Ao sclerosis, no AS, MAC, slight MS, mean 3 mmHg, PASP 34  . Hx of cardiovascular stress test     a. MV 03/2010:  no ischemia, EF 55%  . CHF (congestive heart failure) ([place])   . Retinal detachment, bilateral   . CAD (coronary artery disease)   . Renal insufficiency   . Constipation     Past Surgical History  Procedure Laterality Date  . Cataract extraction    . Tubal ligation    . Arteriovenous graft placement      BUE numerous times  . Arteriovenous graft placement  11/13/10    Lt femoral - Dr. Kellie Simmering  . Dg av dialysis graft declot or  06/06/11, 08/26/11    performed in IR  . Shuntogram N/A 05/29/2013    Procedure: Earney Mallet;  Surgeon: Conrad [place], MD;  Location: [facility] CATH LAB;  Service: Cardiovascular;  Laterality: N/A;  . Shuntogram Left 03/01/2014    Procedure: SHUNTOGRAM;  Surgeon: Conrad Sundance, MD;  Location: [facility] CATH LAB;  Service: Cardiovascular;  Laterality: Left;  . Cardiac catheterization N/A 02/05/2015    Procedure: Left Heart Cath and Coronary Angiography;  Surgeon: Jettie Booze, MD;  Location: [place] CV LAB;  Service: Cardiovascular;  Laterality: N/A;  . Peripheral vascular catheterization N/A 02/06/2015    Procedure: A/V Shuntogram;  Surgeon: Elam Dutch, MD;  Location: [place] CV LAB;  Service: Cardiovascular;  Laterality: N/A;   Family History  Problem Relation Age of Onset  . Cardiomyopathy    . Heart disease Mother   . Diabetes Mother   . Hypertension Mother   . Heart attack Mother   . Diabetes Other   . Kidney disease  Other   . Diabetes Daughter   . Diabetes Son   . Hypertension Son    Social History  Substance Use Topics  . Smoking status: Former Smoker    Quit date: 03/17/1983  . Smokeless tobacco: Never Used  . Alcohol Use: No   OB History    No data available     Review of Systems  Constitutional: Negative for fever and chills.  Respiratory: Negative.  Negative for shortness of breath.   Cardiovascular: Negative.  Negative for chest pain.  Gastrointestinal: Positive for nausea, vomiting, abdominal pain and constipation.  Musculoskeletal: Negative.  Negative for myalgias.  Neurological: Negative.  Negative for weakness.      Allergies  Review of patient's allergies indicates no known  allergies.  Home Medications   Prior to Admission medications   Medication Sig Start Date End Date Taking? Authorizing Provider  acetaminophen (TYLENOL) 325 MG tablet Take 1-2 tablets (325-650 mg total) by mouth every 4 (four) hours as needed for mild pain. 02/25/15   Bary Leriche, PA-C  calcitRIOL (ROCALTROL) 0.5 MCG capsule Take 2 capsules (1 mcg total) by mouth Every Tuesday,Thursday,and Saturday with dialysis. 04/19/15   Velvet Bathe, MD  calcium acetate (ELIPHOS) 667 MG tablet Take 667 mg by mouth 3 (three) times daily with meals. Take 3 capsule in the morning, 2 capsules with a snack and 3 capsules in the evening.    Historical Provider, MD  carvedilol (COREG) 3.125 MG tablet Take 1 tablet (3.125 mg total) by mouth 2 (two) times daily with a meal. Patient not taking: Reported on 04/09/2015 02/25/15   Bary Leriche, PA-C  cinacalcet (SENSIPAR) 90 MG tablet Take 90 mg by mouth daily.    Historical Provider, MD  Darbepoetin Alfa (ARANESP) 100 MCG/0.5ML SOSY injection Inject 0.5 mLs (100 mcg total) into the vein every Tuesday with hemodialysis. Patient not taking: Reported on 04/09/2015 02/25/15   Ivan Anchors Love, PA-C  folic acid-vitamin b complex-vitamin c-selenium-zinc (DIALYVITE) 3 MG TABS tablet Take 1 tablet by mouth daily.    Historical Provider, MD  gabapentin (NEURONTIN) 300 MG capsule Take 300 mg by mouth at bedtime.    Historical Provider, MD  insulin glargine (LANTUS) 100 UNIT/ML injection Inject 0.1 mLs (10 Units total) into the skin at bedtime. 02/25/15   Ivan Anchors Love, PA-C  ketoconazole (NIZORAL) 2 % cream Apply 1 application topically daily. Patient not taking: Reported on 04/09/2015 01/17/15   Landis Martins, DPM  latanoprost (XALATAN) 0.005 % ophthalmic solution Place 1 drop into the left eye at bedtime.    Historical Provider, MD  lidocaine-prilocaine (EMLA) cream Apply 1 application topically as needed (topical anesthesia for hemodialysis if Gebauers and Lidocaine injection are  ineffective.). Patient not taking: Reported on 04/09/2015 02/25/15   Ivan Anchors Love, PA-C  lisinopril (PRINIVIL,ZESTRIL) 30 MG tablet Take 30 mg by mouth at bedtime.    Historical Provider, MD  Menthol-Methyl Salicylate (MUSCLE RUB) 10-15 % CREA Apply 1 application topically 2 (two) times daily as needed for muscle pain. Patient not taking: Reported on 04/09/2015 02/25/15   Ivan Anchors Love, PA-C  multivitamin (RENA-VIT) TABS tablet Take 1 tablet by mouth at bedtime. Patient not taking: Reported on 04/09/2015 02/25/15   Ivan Anchors Love, PA-C  nitroGLYCERIN (NITROSTAT) 0.4 MG SL tablet Place 1 tablet (0.4 mg total) under the tongue every 5 (five) minutes as needed for chest pain. 02/25/15   Bary Leriche, PA-C  polycarbophil (FIBERCON) 625 MG tablet Take 2 tablets (1,250 mg  total) by mouth daily. Patient not taking: Reported on 04/09/2015 02/25/15   Ivan Anchors Love, PA-C  polyethylene glycol [facility] / GLYCOLAX) packet Take 17 g by mouth daily as needed. Patient taking differently: Take 17 g by mouth daily.  02/25/15   Ivan Anchors Love, PA-C  pravastatin (PRAVACHOL) 40 MG tablet Take 40 mg by mouth daily.  02/11/14   Historical Provider, MD  PROAIR HFA 108 (90 BASE) MCG/ACT inhaler Inhale 1 puff into the lungs every 4 (four) hours as needed for wheezing or shortness of breath.  01/13/14   Historical Provider, MD  rOPINIRole (REQUIP) 0.5 MG tablet Take 0.5 mg by mouth at bedtime. 07/27/14   Historical Provider, MD  saccharomyces boulardii (FLORASTOR) 250 MG capsule Take 1 capsule (250 mg total) by mouth 2 (two) times daily. 02/25/15   Ivan Anchors Love, PA-C  VOLTAREN 1 % GEL APPLY 2 GRAMS TO AREA UP TO 4 TIMES A DAY 09/25/14   Historical Provider, MD  warfarin (COUMADIN) 5 MG tablet Take 7.5 mg po daily 04/19/15   Velvet Bathe, MD   BP 133/63 mmHg  Pulse 70  Temp(Src) 98.5 F (36.9 C)  Resp 19  SpO2 100% Physical Exam  Constitutional: She is oriented to person, place, and time. She appears well-developed and  well-nourished.  HENT:  Head: Normocephalic.  Neck: Normal range of motion. Neck supple.  Cardiovascular: Normal rate and regular rhythm.   Pulmonary/Chest: Effort normal and breath sounds normal.  Abdominal: Soft. Bowel sounds are normal. There is tenderness (Tenderness limited to epigastric area.). There is no rebound and no guarding.  Bruits auscultated over LLQ abdomen   Musculoskeletal: Normal range of motion. She exhibits no edema.  Dialysis graft in left thigh.  Neurological: She is alert and oriented to person, place, and time.  Skin: Skin is warm and dry. No rash noted. No erythema.  Psychiatric: She has a normal mood and affect.    ED Course  Procedures (including critical care time) Labs Review Labs Reviewed - No data to display Results for orders placed or performed during the hospital encounter of 09/01/15  CBC with Differential  Result Value Ref Range   WBC 4.5 4.0 - 10.5 K/uL   RBC 3.28 (L) 3.87 - 5.11 MIL/uL   Hemoglobin 10.0 (L) 12.0 - 15.0 g/dL   HCT 31.6 (L) 36.0 - 46.0 %   MCV 96.3 78.0 - 100.0 fL   MCH 30.5 26.0 - 34.0 pg   MCHC 31.6 30.0 - 36.0 g/dL   RDW 15.0 11.5 - 15.5 %   Platelets 123 (L) 150 - 400 K/uL   Neutrophils Relative % 55 %   Neutro Abs 2.5 1.7 - 7.7 K/uL   Lymphocytes Relative 35 %   Lymphs Abs 1.6 0.7 - 4.0 K/uL   Monocytes Relative 9 %   Monocytes Absolute 0.4 0.1 - 1.0 K/uL   Eosinophils Relative 1 %   Eosinophils Absolute 0.0 0.0 - 0.7 K/uL   Basophils Relative 0 %   Basophils Absolute 0.0 0.0 - 0.1 K/uL  Comprehensive metabolic panel  Result Value Ref Range   Sodium 133 (L) 135 - 145 mmol/L   Potassium 3.6 3.5 - 5.1 mmol/L   Chloride 92 (L) 101 - 111 mmol/L   CO2 33 (H) 22 - 32 mmol/L   Glucose, Bld 89 65 - 99 mg/dL   BUN 17 6 - 20 mg/dL   Creatinine, Ser 5.12 (H) 0.44 - 1.00 mg/dL   Calcium 9.1 8.9 -  10.3 mg/dL   Total Protein 6.0 (L) 6.5 - 8.1 g/dL   Albumin 3.4 (L) 3.5 - 5.0 g/dL   AST 21 15 - 41 U/L   ALT 19 14 - 54  U/L   Alkaline Phosphatase 67 38 - 126 U/L   Total Bilirubin 0.4 0.3 - 1.2 mg/dL   GFR calc non Af Amer 8 (L) >60 mL/min   GFR calc Af Amer 9 (L) >60 mL/min   Anion gap 8 5 - 15  Lipase, blood  Result Value Ref Range   Lipase 29 11 - 51 U/L  Troponin I  Result Value Ref Range   Troponin I 0.07 (H) <0.031 ng/mL   Ct Abdomen Pelvis W Contrast  09/02/2015  CLINICAL DATA:  70 year old female with epigastric pain. EXAM: CT ABDOMEN AND PELVIS WITH CONTRAST TECHNIQUE: Multidetector CT imaging of the abdomen and pelvis was performed using the standard protocol following bolus administration of intravenous contrast. CONTRAST:  168mL ISOVUE-300 IOPAMIDOL (ISOVUE-300) INJECTION 61% COMPARISON:  Abdominal radiograph dated 09/01/2015 FINDINGS: The visualized lung bases are clear. Cardiomegaly. No intra-abdominal free air or free fluid. Multiple small scattered hepatic hypodense lesions, incompletely characterized but may represent cysts or hemangioma. MRI without and with contrast may provide better characterisation. The gallbladder, pancreas, and the adrenal glands appear unremarkable. Small splenic hypodense lesion is not well characterized but may represent a cyst or hemangioma. There are bilateral renal atrophy. There are multiple bilateral renal hypodense lesions which are not characterized on this CT. Further evaluation with MRI without and with contrast recommended. Bilateral renal vascular calcifications. A 4 mm nonobstructing calculus may be present in the inferior pole of the left kidney. The visualized ureters cc appear unremarkable. The urinary bladder is collapsed. The uterus is anteverted. There is sigmoid diverticulosis with muscular hypertrophy. No acute inflammatory changes. Constipation. There is no evidence of bowel obstruction or active inflammation. The appendix is not visualized with certainty. No inflammatory changes identified in the right lower quadrant. There is advanced aortoiliac  atherosclerotic disease. There is atherosclerotic calcifications of the origins of the celiac axis, SMA and IMA. The origins of these vessels remain patent. The IVC appears patent. No portal venous gas identified. There is no adenopathy. There is mild diffuse subcutaneous soft tissue stranding of the abdominal wall. There is degenerative changes of the spine. No acute fracture. IMPRESSION: Bilateral renal atrophy with multiple renal hypodense lesions. Further evaluation with nonemergent MRI without and with contrast recommended for additional characterization. Bilateral renal vascular calcification. Probable 4 mm left renal inferior pole calculus. Multiple hepatic hypodense lesions, incompletely characterized. Constipation. No evidence of bowel obstruction or active inflammation. Diverticulosis. Electronically Signed   By: Anner Crete M.D.   On: 09/02/2015 03:08   Dg Abd Acute W/chest  09/02/2015  CLINICAL DATA:  Initial evaluation for acute upper abdominal pain. History of constipation. EXAM: DG ABDOMEN ACUTE W/ 1V CHEST COMPARISON:  Prior radiograph from 05/24/2015. FINDINGS: Cardiomegaly is stable from prior. Mediastinal silhouette within normal limits. Tortuosity the intrathoracic aorta noted. Lungs normally inflated. Mild vascular congestion without overt pulmonary edema. No pleural effusion. No focal infiltrates. No pneumothorax. Vascular stent overlies the left axilla. Radiopaque linear density overlying the right lung apex is unchanged. Bowel gas pattern within normal limits without evidence for obstruction or ileus. No free air. No soft tissue mass. Extensive vascular calcifications noted. Stent overlies the region of the left iliac vessels. Surgical clips at the left inguinal region. Advanced degenerative changes within the lower lumbar spine. IMPRESSION:  1. Nonobstructive bowel gas pattern with no radiographic evidence for acute intra-abdominal process. 2. Cardiomegaly with mild perihilar vascular  congestion without overt pulmonary edema. 3. Chronic findings as above. Electronically Signed   By: Jeannine Boga M.D.   On: 09/02/2015 00:34    Imaging Review No results found. I have personally reviewed and evaluated these images and lab results as part of my medical decision-making.   EKG Interpretation None      MDM   Final diagnoses:  None    1. Constipation 2. Abdominal pain 3. ESRD-HD, stable  The patient presents with pain limited to epigastric abdomen and constipation reporting no bowel movement in 4 days. She has had same in the past. No fever, vomiting x 1.  Lab and imaging evaluation in ED supports diagnosis of constipation. No evidence for acute infection, bowel blockage, or ischemia. There is "mild diffuse subcutaneous soft tissue stranding of the abdominal wall", however, there is no erythema, rash, skin tenderness of abdominal wall on exam.  Will treat with Miralax TID (3 days maximum). She is stable for discharge home to [facility].     Charlann Lange, PA-C 09/02/15 0345  Forde Dandy, MD 09/02/15 [phone]

## 2015-09-01 NOTE — ED Notes (Signed)
Per [facility]: Pt from [facility], [address] Rm 302 - Pt Complaining of upper abdominal pain. HX constipation. Hasn't had a bowel movement in 4 days, tried a suppository and had a small loose bowel movement, took a second suppository, and passed gas. Pt had one episode of vomiting after the suppository. Pt is no longer nauseated.

## 2015-09-02 ENCOUNTER — Emergency Department (HOSPITAL_COMMUNITY): Payer: Medicare Other

## 2015-09-02 DIAGNOSIS — K5900 Constipation, unspecified: Secondary | ICD-10-CM | POA: Diagnosis not present

## 2015-09-02 DIAGNOSIS — R1084 Generalized abdominal pain: Secondary | ICD-10-CM | POA: Diagnosis not present

## 2015-09-02 DIAGNOSIS — I132 Hypertensive heart and chronic kidney disease with heart failure and with stage 5 chronic kidney disease, or end stage renal disease: Secondary | ICD-10-CM | POA: Diagnosis not present

## 2015-09-02 LAB — COMPREHENSIVE METABOLIC PANEL
ALBUMIN: 3.4 g/dL — AB (ref 3.5–5.0)
ALT: 19 U/L (ref 14–54)
AST: 21 U/L (ref 15–41)
Alkaline Phosphatase: 67 U/L (ref 38–126)
Anion gap: 8 (ref 5–15)
BUN: 17 mg/dL (ref 6–20)
CHLORIDE: 92 mmol/L — AB (ref 101–111)
CO2: 33 mmol/L — AB (ref 22–32)
CREATININE: 5.12 mg/dL — AB (ref 0.44–1.00)
Calcium: 9.1 mg/dL (ref 8.9–10.3)
GFR calc Af Amer: 9 mL/min — ABNORMAL LOW (ref >60)
GFR, EST NON AFRICAN AMERICAN: 8 mL/min — AB (ref >60)
Glucose, Bld: 89 mg/dL (ref 65–99)
POTASSIUM: 3.6 mmol/L (ref 3.5–5.1)
SODIUM: 133 mmol/L — AB (ref 135–145)
Total Bilirubin: 0.4 mg/dL (ref 0.3–1.2)
Total Protein: 6 g/dL — ABNORMAL LOW (ref 6.5–8.1)

## 2015-09-02 LAB — TROPONIN I: Troponin I: 0.07 ng/mL — ABNORMAL HIGH

## 2015-09-02 LAB — LIPASE, BLOOD: LIPASE: 29 U/L (ref 11–51)

## 2015-09-02 MED ORDER — POLYETHYLENE GLYCOL 3350 17 G PO PACK
17.0000 g | PACK | Freq: Every day | ORAL | Status: DC
  Administered 2015-09-02: 17 g via ORAL
  Filled 2015-09-02: qty 1

## 2015-09-02 MED ORDER — MORPHINE SULFATE (PF) 4 MG/ML IV SOLN
4.0000 mg | Freq: Once | INTRAVENOUS | Status: AC
Start: 2015-09-02 — End: 2015-09-02
  Administered 2015-09-02: 4 mg via INTRAVENOUS
  Filled 2015-09-02: qty 1

## 2015-09-02 MED ORDER — POLYETHYLENE GLYCOL 3350 17 G PO PACK: PACK | ORAL | Status: DC

## 2015-09-02 MED ORDER — IOPAMIDOL (ISOVUE-300) INJECTION 61%
INTRAVENOUS | Status: AC
  Administered 2015-09-02: 100 mL
  Filled 2015-09-02: qty 100

## 2015-09-02 NOTE — Discharge Instructions (Signed)
Constipation, Adult °Constipation is when a person has fewer than three bowel movements a week, has difficulty having a bowel movement, or has stools that are dry, hard, or larger than normal. As people grow older, constipation is more common. A low-fiber diet, not taking in enough fluids, and taking certain medicines may make constipation worse.  °CAUSES  °· Certain medicines, such as antidepressants, pain medicine, iron supplements, antacids, and water pills.   °· Certain diseases, such as diabetes, irritable bowel syndrome (IBS), thyroid disease, or depression.   °· Not drinking enough water.   °· Not eating enough fiber-rich foods.   °· Stress or travel.   °· Lack of physical activity or exercise.   °· Ignoring the urge to have a bowel movement.   °· Using laxatives too much.   °SIGNS AND SYMPTOMS  °· Having fewer than three bowel movements a week.   °· Straining to have a bowel movement.   °· Having stools that are hard, dry, or larger than normal.   °· Feeling full or bloated.   °· Pain in the lower abdomen.   °· Not feeling relief after having a bowel movement.   °DIAGNOSIS  °Your health care provider will take a medical history and perform a physical exam. Further testing may be done for severe constipation. Some tests may include: °· A barium enema X-ray to examine your rectum, colon, and, sometimes, your small intestine.   °· A sigmoidoscopy to examine your lower colon.   °· A colonoscopy to examine your entire colon. °TREATMENT  °Treatment will depend on the severity of your constipation and what is causing it. Some dietary treatments include drinking more fluids and eating more fiber-rich foods. Lifestyle treatments may include regular exercise. If these diet and lifestyle recommendations do not help, your health care provider may recommend taking over-the-counter laxative medicines to help you have bowel movements. Prescription medicines may be prescribed if over-the-counter medicines do not work.    °HOME CARE INSTRUCTIONS  °· Eat foods that have a lot of fiber, such as fruits, vegetables, whole grains, and beans. °· Limit foods high in fat and processed sugars, such as french fries, hamburgers, cookies, candies, and soda.   °· A fiber supplement may be added to your diet if you cannot get enough fiber from foods.   °· Drink enough fluids to keep your urine clear or pale yellow.   °· Exercise regularly or as directed by your health care provider.   °· Go to the restroom when you have the urge to go. Do not hold it.   °· Only take over-the-counter or prescription medicines as directed by your health care provider. Do not take other medicines for constipation without talking to your health care provider first.   °SEEK IMMEDIATE MEDICAL CARE IF:  °· You have bright red blood in your stool.   °· Your constipation lasts for more than 4 days or gets worse.   °· You have abdominal or rectal pain.   °· You have thin, pencil-like stools.   °· You have unexplained weight loss. °MAKE SURE YOU:  °· Understand these instructions. °· Will watch your condition. °· Will get help right away if you are not doing well or get worse. °  °This information is not intended to replace advice given to you by your health care provider. Make sure you discuss any questions you have with your health care provider. °  °Document Released: 11/29/2003 Document Revised: 03/23/2014 Document Reviewed: 12/12/2012 °Elsevier Interactive Patient Education ©2016 [employer] Inc[employer] ° °High-Fiber Diet °Fiber, also called dietary fiber, is a type of carbohydrate found in fruits, vegetables, whole grains, and   beans. A high-fiber diet can have many health benefits. Your health care provider may recommend a high-fiber diet to help: °· Prevent constipation. Fiber can make your bowel movements more regular. °· Lower your cholesterol. °· Relieve hemorrhoids, uncomplicated diverticulosis, or irritable bowel syndrome. °· Prevent overeating as part of a weight-loss  plan. °· Prevent heart disease, type 2 diabetes, and certain cancers. °WHAT IS MY PLAN? °The recommended daily intake of fiber includes: °· 38 grams for men under age 50. °· 30 grams for men over age 50. °· 25 grams for women under age 50. °· 21 grams for women over age 50. °You can get the recommended daily intake of dietary fiber by eating a variety of fruits, vegetables, grains, and beans. Your health care provider may also recommend a fiber supplement if it is not possible to get enough fiber through your diet. °WHAT DO I NEED TO KNOW ABOUT A HIGH-FIBER DIET? °· Fiber supplements have not been widely studied for their effectiveness, so it is better to get fiber through food sources. °· Always check the fiber content on the nutrition facts label of any prepackaged food. Look for foods that contain at least 5 grams of fiber per serving. °· Ask your dietitian if you have questions about specific foods that are related to your condition, especially if those foods are not listed in the following section. °· Increase your daily fiber consumption gradually. Increasing your intake of dietary fiber too quickly may cause bloating, cramping, or gas. °· Drink plenty of water. Water helps you to digest fiber. °WHAT FOODS CAN I EAT? °Grains °Whole-grain breads. Multigrain cereal. Oats and oatmeal. Brown rice. Barley. Bulgur wheat. Millet. Bran muffins. Popcorn. Rye wafer crackers. °Vegetables °Sweet potatoes. Spinach. Kale. Artichokes. Cabbage. Broccoli. Green peas. Carrots. Squash. °Fruits °Berries. Pears. Apples. Oranges. Avocados. Prunes and raisins. Dried figs. °Meats and Other Protein Sources °Navy, kidney, pinto, and soy beans. Split peas. Lentils. Nuts and seeds. °Dairy °Fiber-fortified yogurt. °Beverages °Fiber-fortified soy milk. Fiber-fortified orange juice. °Other °Fiber bars. °The items listed above may not be a complete list of recommended foods or beverages. Contact your dietitian for more options. °WHAT FOODS  ARE NOT RECOMMENDED? °Grains °White bread. Pasta made with refined flour. White rice. °Vegetables °Fried potatoes. Canned vegetables. Well-cooked vegetables.  °Fruits °Fruit juice. Cooked, strained fruit. °Meats and Other Protein Sources °Fatty cuts of meat. Fried poultry or fried fish. °Dairy °Milk. Yogurt. Cream cheese. Sour cream. °Beverages °Soft drinks. °Other °Cakes and pastries. Butter and oils. °The items listed above may not be a complete list of foods and beverages to avoid. Contact your dietitian for more information. °WHAT ARE SOME TIPS FOR INCLUDING HIGH-FIBER FOODS IN MY DIET? °· Eat a wide variety of high-fiber foods. °· Make sure that half of all grains consumed each day are whole grains. °· Replace breads and cereals made from refined flour or white flour with whole-grain breads and cereals. °· Replace white rice with brown rice, bulgur wheat, or millet. °· Start the day with a breakfast that is high in fiber, such as a cereal that contains at least 5 grams of fiber per serving. °· Use beans in place of meat in soups, salads, or pasta. °· Eat high-fiber snacks, such as berries, raw vegetables, nuts, or popcorn. °  °This information is not intended to replace advice given to you by your health care provider. Make sure you discuss any questions you have with your health care provider. °  °Document Released: 03/02/2005 Document Revised: 03/23/2014 Document   Reviewed: 08/15/2013 °Elsevier Interactive Patient Education ©2016 Elsevier Inc. ° °

## 2015-09-02 NOTE — ED Notes (Signed)
Dr. Nanavati at bedside 

## 2015-09-02 NOTE — ED Notes (Signed)
Attempted to call report back to facility, no answer.

## 2015-09-02 NOTE — ED Notes (Signed)
Patient transported to CT 

## 2015-09-03 ENCOUNTER — Emergency Department (HOSPITAL_COMMUNITY)
Admission: EM | Admit: 2015-09-03 | Discharge: 2015-09-04 | Disposition: A | Discharge status: Home / Self Care | Payer: Medicare Other | Attending: Emergency Medicine | Admitting: Emergency Medicine

## 2015-09-03 ENCOUNTER — Encounter (HOSPITAL_COMMUNITY): Payer: Self-pay | Admitting: Emergency Medicine

## 2015-09-03 DIAGNOSIS — T82837A Hemorrhage of cardiac prosthetic devices, implants and grafts, initial encounter: Secondary | ICD-10-CM | POA: Diagnosis not present

## 2015-09-03 DIAGNOSIS — T82898A Other specified complication of vascular prosthetic devices, implants and grafts, initial encounter: Secondary | ICD-10-CM | POA: Diagnosis not present

## 2015-09-03 DIAGNOSIS — I2510 Atherosclerotic heart disease of native coronary artery without angina pectoris: Secondary | ICD-10-CM | POA: Diagnosis not present

## 2015-09-03 DIAGNOSIS — R109 Unspecified abdominal pain: Secondary | ICD-10-CM | POA: Diagnosis not present

## 2015-09-03 DIAGNOSIS — Y713 Surgical instruments, materials and cardiovascular devices (including sutures) associated with adverse incidents: Secondary | ICD-10-CM | POA: Diagnosis not present

## 2015-09-03 DIAGNOSIS — T829XXA Unspecified complication of cardiac and vascular prosthetic device, implant and graft, initial encounter: Secondary | ICD-10-CM | POA: Diagnosis present

## 2015-09-03 DIAGNOSIS — Z7901 Long term (current) use of anticoagulants: Secondary | ICD-10-CM | POA: Insufficient documentation

## 2015-09-03 DIAGNOSIS — D631 Anemia in chronic kidney disease: Secondary | ICD-10-CM | POA: Diagnosis not present

## 2015-09-03 DIAGNOSIS — K5900 Constipation, unspecified: Secondary | ICD-10-CM | POA: Insufficient documentation

## 2015-09-03 DIAGNOSIS — Z794 Long term (current) use of insulin: Secondary | ICD-10-CM | POA: Diagnosis not present

## 2015-09-03 DIAGNOSIS — I509 Heart failure, unspecified: Secondary | ICD-10-CM | POA: Insufficient documentation

## 2015-09-03 DIAGNOSIS — Z79899 Other long term (current) drug therapy: Secondary | ICD-10-CM | POA: Diagnosis not present

## 2015-09-03 DIAGNOSIS — E1129 Type 2 diabetes mellitus with other diabetic kidney complication: Secondary | ICD-10-CM | POA: Diagnosis not present

## 2015-09-03 DIAGNOSIS — Z87891 Personal history of nicotine dependence: Secondary | ICD-10-CM | POA: Insufficient documentation

## 2015-09-03 DIAGNOSIS — N2581 Secondary hyperparathyroidism of renal origin: Secondary | ICD-10-CM | POA: Diagnosis not present

## 2015-09-03 DIAGNOSIS — T82590A Other mechanical complication of surgically created arteriovenous fistula, initial encounter: Secondary | ICD-10-CM

## 2015-09-03 DIAGNOSIS — I110 Hypertensive heart disease with heart failure: Secondary | ICD-10-CM | POA: Diagnosis not present

## 2015-09-03 DIAGNOSIS — Z8673 Personal history of transient ischemic attack (TIA), and cerebral infarction without residual deficits: Secondary | ICD-10-CM | POA: Insufficient documentation

## 2015-09-03 DIAGNOSIS — E119 Type 2 diabetes mellitus without complications: Secondary | ICD-10-CM | POA: Diagnosis not present

## 2015-09-03 DIAGNOSIS — D509 Iron deficiency anemia, unspecified: Secondary | ICD-10-CM | POA: Diagnosis not present

## 2015-09-03 DIAGNOSIS — N186 End stage renal disease: Secondary | ICD-10-CM | POA: Diagnosis not present

## 2015-09-03 LAB — BASIC METABOLIC PANEL
ANION GAP: 13 (ref 5–15)
BUN: 7 mg/dL (ref 6–20)
CHLORIDE: 94 mmol/L — AB (ref 101–111)
CO2: 28 mmol/L (ref 22–32)
CREATININE: 3.74 mg/dL — AB (ref 0.44–1.00)
Calcium: 9 mg/dL (ref 8.9–10.3)
GFR calc non Af Amer: 11 mL/min — ABNORMAL LOW (ref >60)
GFR, EST AFRICAN AMERICAN: 13 mL/min — AB (ref >60)
Glucose, Bld: 76 mg/dL (ref 65–99)
Potassium: 3.4 mmol/L — ABNORMAL LOW (ref 3.5–5.1)
Sodium: 135 mmol/L (ref 135–145)

## 2015-09-03 LAB — CBC WITH DIFFERENTIAL/PLATELET
BASOS PCT: 0 %
Basophils Absolute: 0 10*3/uL (ref 0.0–0.1)
Eosinophils Absolute: 0 10*3/uL (ref 0.0–0.7)
Eosinophils Relative: 1 %
HEMATOCRIT: 36.2 % (ref 36.0–46.0)
HEMOGLOBIN: 11.3 g/dL — AB (ref 12.0–15.0)
LYMPHS PCT: 21 %
Lymphs Abs: 0.8 10*3/uL (ref 0.7–4.0)
MCH: 30.1 pg (ref 26.0–34.0)
MCHC: 31.2 g/dL (ref 30.0–36.0)
MCV: 96.3 fL (ref 78.0–100.0)
MONOS PCT: 10 %
Monocytes Absolute: 0.3 10*3/uL (ref 0.1–1.0)
NEUTROS ABS: 2.4 10*3/uL (ref 1.7–7.7)
NEUTROS PCT: 68 %
Platelets: 140 10*3/uL — ABNORMAL LOW (ref 150–400)
RBC: 3.76 MIL/uL — ABNORMAL LOW (ref 3.87–5.11)
RDW: 15.4 % (ref 11.5–15.5)
WBC: 3.5 10*3/uL — ABNORMAL LOW (ref 4.0–10.5)

## 2015-09-03 LAB — SAMPLE TO BLOOD BANK

## 2015-09-03 LAB — PROTIME-INR
INR: 4.85 — AB (ref 0.00–1.49)
Prothrombin Time: 44 seconds — ABNORMAL HIGH (ref 11.6–15.2)

## 2015-09-03 MED ORDER — MILK AND MOLASSES ENEMA
1.0000 | Freq: Once | RECTAL | Status: DC
  Filled 2015-09-03: qty 250

## 2015-09-03 MED ORDER — FLEET ENEMA 7-19 GM/118ML RE ENEM
1.0000 | ENEMA | Freq: Once | RECTAL | Status: AC
  Administered 2015-09-03: 1 via RECTAL
  Filled 2015-09-03: qty 1

## 2015-09-03 MED ORDER — PROTAMINE SULFATE 10 MG/ML IV SOLN
50.0000 mg | Freq: Once | INTRAVENOUS | Status: AC
  Administered 2015-09-03: 50 mg via INTRAVENOUS
  Filled 2015-09-03: qty 5

## 2015-09-03 NOTE — ED Provider Notes (Signed)
CSN: [id number]     Arrival date & time 09/03/15  1802 History   First MD Initiated Contact with Patient 09/03/15 1804     Chief Complaint  Patient presents with  . graft bleeding      (Consider location/radiation/quality/duration/timing/severity/associated sxs/prior Treatment) HPI Comments: Patient with end-stage renal disease 13 years, with acute pulsatile bleeding from left lower extremity graft placed 5 years ago. Bleeding began around 3:20 PM. Pressure has been held since that time without improvement. Patient also complains of constipation x 6 days. Patient was seen in emergency department 2 nights ago with same complaint. CT scan performed at that time was negative. Patient was placed on MiraLAX which she states she has been taking. No other complaints. The onset of this condition was acute. The course is constant. Aggravating factors: none. Alleviating factors: none.    The history is provided by the patient and medical records.    Past Medical History  Diagnosis Date  . Hypertension   . Hyperlipidemia   . Diabetes mellitus   . CVA (cerebral vascular accident) ([place]) [place]    left sided weakness  . Protein malnutrition ([place])   . Secondary hyperparathyroidism ([place])   . Anemia   . End stage renal disease ([place])     initiated HD 2006  . Hx of echocardiogram     a. Echo 03/2010: EF 55-60%, Gr 1 diast dysfn, trivial MS, mild to mod LAE, PASP 34, ;  b. Echo 11/13: mild LVH, EF 60%, Gr 1 diast dysfn, Ao sclerosis, no AS, MAC, slight MS, mean 3 mmHg, PASP 34  . Hx of cardiovascular stress test     a. MV 03/2010:  no ischemia, EF 55%  . CHF (congestive heart failure) ([place])   . Retinal detachment, bilateral   . CAD (coronary artery disease)   . Renal insufficiency   . Constipation    Past Surgical History  Procedure Laterality Date  . Cataract extraction    . Tubal ligation    . Arteriovenous graft placement      BUE numerous times  . Arteriovenous graft placement  11/13/10     Lt femoral - Dr. Kellie Simmering  . Dg av dialysis graft declot or  06/06/11, 08/26/11    performed in IR  . Shuntogram N/A 05/29/2013    Procedure: Earney Mallet;  Surgeon: Conrad Mars, MD;  Location: [facility] CATH LAB;  Service: Cardiovascular;  Laterality: N/A;  . Shuntogram Left 03/01/2014    Procedure: SHUNTOGRAM;  Surgeon: Conrad La [place], MD;  Location: [facility] CATH LAB;  Service: Cardiovascular;  Laterality: Left;  . Cardiac catheterization N/A 02/05/2015    Procedure: Left Heart Cath and Coronary Angiography;  Surgeon: Jettie Booze, MD;  Location: [place] CV LAB;  Service: Cardiovascular;  Laterality: N/A;  . Peripheral vascular catheterization N/A 02/06/2015    Procedure: A/V Shuntogram;  Surgeon: Elam Dutch, MD;  Location: [place] CV LAB;  Service: Cardiovascular;  Laterality: N/A;   Family History  Problem Relation Age of Onset  . Cardiomyopathy    . Heart disease Mother   . Diabetes Mother   . Hypertension Mother   . Heart attack Mother   . Diabetes Other   . Kidney disease Other   . Diabetes Daughter   . Diabetes Son   . Hypertension Son    Social History  Substance Use Topics  . Smoking status: Former Smoker    Quit date: 03/17/1983  . Smokeless tobacco: Never Used  .  Alcohol Use: No   OB History    No data available     Review of Systems  Constitutional: Negative for fever.  HENT: Negative for rhinorrhea and sore throat.   Eyes: Negative for redness.  Respiratory: Negative for cough.   Cardiovascular: Negative for chest pain.  Gastrointestinal: Positive for constipation. Negative for nausea, vomiting, abdominal pain and diarrhea.  Genitourinary: Negative for dysuria.  Musculoskeletal: Negative for myalgias.  Skin: Positive for wound. Negative for rash.  Neurological: Negative for headaches.    Allergies  Review of patient's allergies indicates no known allergies.  Home Medications   Prior to Admission medications   Medication Sig Start Date End Date  Taking? Authorizing Provider  acetaminophen (TYLENOL) 325 MG tablet Take 1-2 tablets (325-650 mg total) by mouth every 4 (four) hours as needed for mild pain. Patient not taking: Reported on 09/01/2015 02/25/15   Ivan Anchors Love, PA-C  calcitRIOL (ROCALTROL) 0.5 MCG capsule Take 2 capsules (1 mcg total) by mouth Every Tuesday,Thursday,and Saturday with dialysis. Patient not taking: Reported on 09/01/2015 04/19/15   Velvet Bathe, MD  carvedilol (COREG) 3.125 MG tablet Take 1 tablet (3.125 mg total) by mouth 2 (two) times daily with a meal. Patient not taking: Reported on 04/09/2015 02/25/15   Ivan Anchors Love, PA-C  cinacalcet (SENSIPAR) 30 MG tablet Take 60 mg by mouth daily.    Historical Provider, MD  Darbepoetin Alfa (ARANESP) 100 MCG/0.5ML SOSY injection Inject 0.5 mLs (100 mcg total) into the vein every Tuesday with hemodialysis. Patient not taking: Reported on 04/09/2015 02/25/15   Ivan Anchors Love, PA-C  Insulin Degludec (TRESIBA FLEXTOUCH) 100 UNIT/ML SOPN Inject 8 Units into the skin at bedtime.    Historical Provider, MD  insulin glargine (LANTUS) 100 UNIT/ML injection Inject 0.1 mLs (10 Units total) into the skin at bedtime. Patient not taking: Reported on 09/01/2015 02/25/15   Ivan Anchors Love, PA-C  ketoconazole (NIZORAL) 2 % cream Apply 1 application topically daily. Patient not taking: Reported on 04/09/2015 01/17/15   Landis Martins, DPM  latanoprost (XALATAN) 0.005 % ophthalmic solution Place 1 drop into the left eye at bedtime.    Historical Provider, MD  lidocaine-prilocaine (EMLA) cream Apply 1 application topically as needed (topical anesthesia for hemodialysis if Gebauers and Lidocaine injection are ineffective.). Patient taking differently: Apply 1 application topically 3 (three) times a week.  02/25/15   Ivan Anchors Love, PA-C  lisinopril (PRINIVIL,ZESTRIL) 30 MG tablet Take 30 mg by mouth at bedtime.    Historical Provider, MD  loratadine (CLARITIN) 10 MG tablet Take 10 mg by mouth daily.     Historical Provider, MD  Menthol-Methyl Salicylate (MUSCLE RUB) 10-15 % CREA Apply 1 application topically 2 (two) times daily as needed for muscle pain. Patient not taking: Reported on 04/09/2015 02/25/15   Ivan Anchors Love, PA-C  multivitamin (RENA-VIT) TABS tablet Take 1 tablet by mouth at bedtime. Patient not taking: Reported on 04/09/2015 02/25/15   Ivan Anchors Love, PA-C  nitroGLYCERIN (NITROSTAT) 0.4 MG SL tablet Place 1 tablet (0.4 mg total) under the tongue every 5 (five) minutes as needed for chest pain. 02/25/15   Bary Leriche, PA-C  polycarbophil (FIBERCON) 625 MG tablet Take 2 tablets (1,250 mg total) by mouth daily. Patient not taking: Reported on 04/09/2015 02/25/15   Ivan Anchors Love, PA-C  polyethylene glycol [facility]) packet Use 17 gms three times daily until bowels move, with maximum of 3 consecutive days 09/02/15   Charlann Lange, PA-C  pravastatin (PRAVACHOL) 40  MG tablet Take 40 mg by mouth daily.  02/11/14   Historical Provider, MD  rOPINIRole (REQUIP) 0.5 MG tablet Take 0.5 mg by mouth at bedtime. 07/27/14   Historical Provider, MD  saccharomyces boulardii (FLORASTOR) 250 MG capsule Take 1 capsule (250 mg total) by mouth 2 (two) times daily. Patient not taking: Reported on 09/01/2015 02/25/15   Ivan Anchors Love, PA-C  sevelamer carbonate (RENVELA) 2.4 g PACK Take 2.4 g by mouth See admin instructions. Three times a day and with a snack    Historical Provider, MD  traMADol (ULTRAM) 50 MG tablet Take 50 mg by mouth 2 (two) times daily.    Historical Provider, MD  warfarin (COUMADIN) 5 MG tablet Take 7.5 mg po daily Patient not taking: Reported on 09/01/2015 04/19/15   Velvet Bathe, MD  warfarin (COUMADIN) 7.5 MG tablet Take 7.5 mg by mouth daily.    Historical Provider, MD   BP 160/74 mmHg  Pulse 67  Temp(Src) 97.6 F (36.4 C) (Oral)  Resp 16  Wt 71.215 kg  SpO2 99%   Physical Exam  Constitutional: She appears well-developed and well-nourished.  HENT:  Head: Normocephalic and  atraumatic.  Eyes: Conjunctivae are normal. Right eye exhibits no discharge. Left eye exhibits no discharge.  Neck: Normal range of motion. Neck supple.  Cardiovascular: Normal rate, regular rhythm and normal heart sounds.   Pulmonary/Chest: Effort normal and breath sounds normal.  Abdominal: Soft. There is no tenderness.  Neurological: She is alert.  Skin: Skin is warm and dry.  Pulsatile bleeding from anterior AV graft in left LE. Distal leg is normal color, temperature.   Psychiatric: She has a normal mood and affect.  Nursing note and vitals reviewed.   ED Course  Procedures (including critical care time) Labs Review Labs Reviewed  CBC WITH DIFFERENTIAL/PLATELET - Abnormal; Notable for the following:    WBC 3.5 (*)    RBC 3.76 (*)    Hemoglobin 11.3 (*)    Platelets 140 (*)    All other components within normal limits  BASIC METABOLIC PANEL - Abnormal; Notable for the following:    Potassium 3.4 (*)    Chloride 94 (*)    Creatinine, Ser 3.74 (*)    GFR calc non Af Amer 11 (*)    GFR calc Af Amer 13 (*)    All other components within normal limits  PROTIME-INR - Abnormal; Notable for the following:    Prothrombin Time 44.0 (*)    INR 4.85 (*)    All other components within normal limits  SAMPLE TO BLOOD BANK    Imaging Review Ct Abdomen Pelvis W Contrast  09/02/2015  CLINICAL DATA:  70 year old female with epigastric pain. EXAM: CT ABDOMEN AND PELVIS WITH CONTRAST TECHNIQUE: Multidetector CT imaging of the abdomen and pelvis was performed using the standard protocol following bolus administration of intravenous contrast. CONTRAST:  177mL ISOVUE-300 IOPAMIDOL (ISOVUE-300) INJECTION 61% COMPARISON:  Abdominal radiograph dated 09/01/2015 FINDINGS: The visualized lung bases are clear. Cardiomegaly. No intra-abdominal free air or free fluid. Multiple small scattered hepatic hypodense lesions, incompletely characterized but may represent cysts or hemangioma. MRI without and with  contrast may provide better characterisation. The gallbladder, pancreas, and the adrenal glands appear unremarkable. Small splenic hypodense lesion is not well characterized but may represent a cyst or hemangioma. There are bilateral renal atrophy. There are multiple bilateral renal hypodense lesions which are not characterized on this CT. Further evaluation with MRI without and with contrast recommended. Bilateral renal vascular  calcifications. A 4 mm nonobstructing calculus may be present in the inferior pole of the left kidney. The visualized ureters cc appear unremarkable. The urinary bladder is collapsed. The uterus is anteverted. There is sigmoid diverticulosis with muscular hypertrophy. No acute inflammatory changes. Constipation. There is no evidence of bowel obstruction or active inflammation. The appendix is not visualized with certainty. No inflammatory changes identified in the right lower quadrant. There is advanced aortoiliac atherosclerotic disease. There is atherosclerotic calcifications of the origins of the celiac axis, SMA and IMA. The origins of these vessels remain patent. The IVC appears patent. No portal venous gas identified. There is no adenopathy. There is mild diffuse subcutaneous soft tissue stranding of the abdominal wall. There is degenerative changes of the spine. No acute fracture. IMPRESSION: Bilateral renal atrophy with multiple renal hypodense lesions. Further evaluation with nonemergent MRI without and with contrast recommended for additional characterization. Bilateral renal vascular calcification. Probable 4 mm left renal inferior pole calculus. Multiple hepatic hypodense lesions, incompletely characterized. Constipation. No evidence of bowel obstruction or active inflammation. Diverticulosis. Electronically Signed   By: Anner Crete M.D.   On: 09/02/2015 03:08   Dg Abd Acute W/chest  09/02/2015  CLINICAL DATA:  Initial evaluation for acute upper abdominal pain. History  of constipation. EXAM: DG ABDOMEN ACUTE W/ 1V CHEST COMPARISON:  Prior radiograph from 05/24/2015. FINDINGS: Cardiomegaly is stable from prior. Mediastinal silhouette within normal limits. Tortuosity the intrathoracic aorta noted. Lungs normally inflated. Mild vascular congestion without overt pulmonary edema. No pleural effusion. No focal infiltrates. No pneumothorax. Vascular stent overlies the left axilla. Radiopaque linear density overlying the right lung apex is unchanged. Bowel gas pattern within normal limits without evidence for obstruction or ileus. No free air. No soft tissue mass. Extensive vascular calcifications noted. Stent overlies the region of the left iliac vessels. Surgical clips at the left inguinal region. Advanced degenerative changes within the lower lumbar spine. IMPRESSION: 1. Nonobstructive bowel gas pattern with no radiographic evidence for acute intra-abdominal process. 2. Cardiomegaly with mild perihilar vascular congestion without overt pulmonary edema. 3. Chronic findings as above. Electronically Signed   By: Jeannine Boga M.D.   On: 09/02/2015 00:34   I have personally reviewed and evaluated these images and lab results as part of my medical decision-making.  6:46 PM Patient seen and examined with Dr. Jeanell Sparrow. Gauze removed and point pressure being held starting at 6:30pm. Will continue to hold pressure. Labs ordered. Will need to check INR. Protamine ordered. Patient is hemodynamically stable.   Vital signs reviewed and are as follows: BP 160/74 mmHg  Pulse 67  Temp(Src) 97.6 F (36.4 C) (Oral)  Resp 16  Wt 71.215 kg  SpO2 99%  6:57 PM Pressure held for 25 minutes, continuing. No bleed through. No IV yet. Phlebotomy is in with patient.   9:33 PM Pressure bandage was applied at 1930 by myself. There was a delay in obtaining blood as patient is a difficult stick. Wound rechecked. No bleed through. Bleeding is controlled. Leg is well perfused. Will leave dressing in  place.   Hgb is 11.3. INR 4.85. Patient is to skip coumadin dose tomorrow and follow-up regarding this at dialysis in 2 days.   Patient also continues to c/o constipation. She requests enema. Ordered. Will give fleet enema x 1. Aware that we don't want to overload with phosphate. Will send home with glycerin suppository as well.   12:28 AM Patient with moderate BM with enema. Will d/c to home with glycerin supp.  Bleeding remains controlled. Leg is well perfused. Patient counseled leave pressure dressing in place for 24 hours. Once again reiterated, hold Coumadin tomorrow, and discuss resuming this medication with dialysis.  Patient ready for discharge to home. Will need transportation.  MDM   Final diagnoses:  Malfunction of arteriovenous graft, initial encounter (HCC)  Constipation, unspecified constipation type   Pulsatile bleeding from AV graft: Controlled with pressure. Bleeding remains controlled in the ED after administration of pressure bandage.  Constipation: Patient had CT scan in the emergency department 2 nights ago. She has been taking MiraLAX without relief. Some improvement tonight with enema. Will add glycerin suppositories. Patient will continue to use MiraLAX.    Carlisle Cater, PA-C 09/04/15 [id number]  Pattricia Boss, MD 09/04/15 [phone]

## 2015-09-03 NOTE — ED Notes (Signed)
Pt to ED via [facility], from [address] [facility] with graft bleeding left upper thigh-- continues to bleed since 1520-- direct pressure held continuously.

## 2015-09-03 NOTE — ED Notes (Signed)
Pt had one medium size bowel movement post enema

## 2015-09-03 NOTE — ED Notes (Signed)
PA at bedside.

## 2015-09-03 NOTE — ED Provider Notes (Signed)
70 year old female with left thigh AV graft that began bleeding after dialysis today. She continues to have what appears to be high pressure bleeding from the graft. Point pressure has been applied. She has continued to bleed and patient is receiving protamine IV.   I performed a history and physical examination of Stefanie [name] and discussed her management with Mr. Rondel Oh.  I agree with the history, physical, assessment, and plan of care, with the following exceptions: None  I was present for the following procedures: None Time Spent in Critical Care of the patient: None Time spent in discussions with the patient and family: 10  Stefanie Kertesz Shaune Pollack, MD 09/04/15 1331

## 2015-09-04 DIAGNOSIS — R58 Hemorrhage, not elsewhere classified: Secondary | ICD-10-CM | POA: Diagnosis not present

## 2015-09-04 DIAGNOSIS — Z7901 Long term (current) use of anticoagulants: Secondary | ICD-10-CM | POA: Diagnosis not present

## 2015-09-04 MED ORDER — GLYCERIN (LAXATIVE) 2 G RE SUPP: 1.0000 | Freq: Once | RECTAL | Status: DC

## 2015-09-04 NOTE — Discharge Instructions (Signed)
Please read and follow all provided instructions.  Your diagnoses today include:  1. Malfunction of arteriovenous graft, initial encounter ([place])   2. Constipation, unspecified constipation type    Tests performed today include:  Coumadin level - 4.8 which is high - do not take coumadin tomorrow (6/21)  Blood counts - mildly low red blood cell count  Vital signs. See below for your results today.   Medications prescribed:   Glycerin suppository  Take any prescribed medications only as directed.  Home care instructions:  Follow any educational materials contained in this packet.  Follow-up instructions: Please follow-up with your primary care provider in the next 3 days for further evaluation of your symptoms.   Return instructions:   Please return to the Emergency Department if you experience worsening symptoms.   Please return if you have any other emergent concerns.  Additional Information:  Your vital signs today were: BP 119/55 mmHg   Pulse 68   Temp(Src) 97.6 F (36.4 C) (Oral)   Resp 16   Wt 71.215 kg   SpO2 97% If your blood pressure (BP) was elevated above 135/85 this visit, please have this repeated by your doctor within one month. --------------

## 2015-09-04 NOTE — ED Notes (Signed)
[facility] contacted to tx patient back to [facility]

## 2015-09-05 DIAGNOSIS — D509 Iron deficiency anemia, unspecified: Secondary | ICD-10-CM | POA: Diagnosis not present

## 2015-09-05 DIAGNOSIS — N2581 Secondary hyperparathyroidism of renal origin: Secondary | ICD-10-CM | POA: Diagnosis not present

## 2015-09-05 DIAGNOSIS — N186 End stage renal disease: Secondary | ICD-10-CM | POA: Diagnosis not present

## 2015-09-05 DIAGNOSIS — E1129 Type 2 diabetes mellitus with other diabetic kidney complication: Secondary | ICD-10-CM | POA: Diagnosis not present

## 2015-09-05 DIAGNOSIS — D631 Anemia in chronic kidney disease: Secondary | ICD-10-CM | POA: Diagnosis not present

## 2015-09-07 DIAGNOSIS — D631 Anemia in chronic kidney disease: Secondary | ICD-10-CM | POA: Diagnosis not present

## 2015-09-07 DIAGNOSIS — E1129 Type 2 diabetes mellitus with other diabetic kidney complication: Secondary | ICD-10-CM | POA: Diagnosis not present

## 2015-09-07 DIAGNOSIS — N2581 Secondary hyperparathyroidism of renal origin: Secondary | ICD-10-CM | POA: Diagnosis not present

## 2015-09-07 DIAGNOSIS — N186 End stage renal disease: Secondary | ICD-10-CM | POA: Diagnosis not present

## 2015-09-07 DIAGNOSIS — D509 Iron deficiency anemia, unspecified: Secondary | ICD-10-CM | POA: Diagnosis not present

## 2015-09-10 DIAGNOSIS — D631 Anemia in chronic kidney disease: Secondary | ICD-10-CM | POA: Diagnosis not present

## 2015-09-10 DIAGNOSIS — N2581 Secondary hyperparathyroidism of renal origin: Secondary | ICD-10-CM | POA: Diagnosis not present

## 2015-09-10 DIAGNOSIS — D509 Iron deficiency anemia, unspecified: Secondary | ICD-10-CM | POA: Diagnosis not present

## 2015-09-10 DIAGNOSIS — H409 Unspecified glaucoma: Secondary | ICD-10-CM | POA: Diagnosis not present

## 2015-09-10 DIAGNOSIS — I10 Essential (primary) hypertension: Secondary | ICD-10-CM | POA: Diagnosis not present

## 2015-09-10 DIAGNOSIS — G2581 Restless legs syndrome: Secondary | ICD-10-CM | POA: Diagnosis not present

## 2015-09-10 DIAGNOSIS — E1129 Type 2 diabetes mellitus with other diabetic kidney complication: Secondary | ICD-10-CM | POA: Diagnosis not present

## 2015-09-10 DIAGNOSIS — I509 Heart failure, unspecified: Secondary | ICD-10-CM | POA: Diagnosis not present

## 2015-09-10 DIAGNOSIS — E213 Hyperparathyroidism, unspecified: Secondary | ICD-10-CM | POA: Diagnosis not present

## 2015-09-10 DIAGNOSIS — I69954 Hemiplegia and hemiparesis following unspecified cerebrovascular disease affecting left non-dominant side: Secondary | ICD-10-CM | POA: Diagnosis not present

## 2015-09-10 DIAGNOSIS — E785 Hyperlipidemia, unspecified: Secondary | ICD-10-CM | POA: Diagnosis not present

## 2015-09-10 DIAGNOSIS — N186 End stage renal disease: Secondary | ICD-10-CM | POA: Diagnosis not present

## 2015-09-10 DIAGNOSIS — E119 Type 2 diabetes mellitus without complications: Secondary | ICD-10-CM | POA: Diagnosis not present

## 2015-09-11 DIAGNOSIS — M79673 Pain in unspecified foot: Secondary | ICD-10-CM | POA: Diagnosis not present

## 2015-09-11 DIAGNOSIS — Z7901 Long term (current) use of anticoagulants: Secondary | ICD-10-CM | POA: Diagnosis not present

## 2015-09-11 DIAGNOSIS — B351 Tinea unguium: Secondary | ICD-10-CM | POA: Diagnosis not present

## 2015-09-11 DIAGNOSIS — B070 Plantar wart: Secondary | ICD-10-CM | POA: Diagnosis not present

## 2015-09-11 DIAGNOSIS — L851 Acquired keratosis [keratoderma] palmaris et plantaris: Secondary | ICD-10-CM | POA: Diagnosis not present

## 2015-09-12 DIAGNOSIS — D631 Anemia in chronic kidney disease: Secondary | ICD-10-CM | POA: Diagnosis not present

## 2015-09-12 DIAGNOSIS — E1129 Type 2 diabetes mellitus with other diabetic kidney complication: Secondary | ICD-10-CM | POA: Diagnosis not present

## 2015-09-12 DIAGNOSIS — D509 Iron deficiency anemia, unspecified: Secondary | ICD-10-CM | POA: Diagnosis not present

## 2015-09-12 DIAGNOSIS — N2581 Secondary hyperparathyroidism of renal origin: Secondary | ICD-10-CM | POA: Diagnosis not present

## 2015-09-12 DIAGNOSIS — N186 End stage renal disease: Secondary | ICD-10-CM | POA: Diagnosis not present

## 2015-09-13 DIAGNOSIS — Z992 Dependence on renal dialysis: Secondary | ICD-10-CM | POA: Diagnosis not present

## 2015-09-13 DIAGNOSIS — E1129 Type 2 diabetes mellitus with other diabetic kidney complication: Secondary | ICD-10-CM | POA: Diagnosis not present

## 2015-09-13 DIAGNOSIS — N186 End stage renal disease: Secondary | ICD-10-CM | POA: Diagnosis not present

## 2015-09-14 DIAGNOSIS — D631 Anemia in chronic kidney disease: Secondary | ICD-10-CM | POA: Diagnosis not present

## 2015-09-14 DIAGNOSIS — N2581 Secondary hyperparathyroidism of renal origin: Secondary | ICD-10-CM | POA: Diagnosis not present

## 2015-09-14 DIAGNOSIS — E1129 Type 2 diabetes mellitus with other diabetic kidney complication: Secondary | ICD-10-CM | POA: Diagnosis not present

## 2015-09-14 DIAGNOSIS — N186 End stage renal disease: Secondary | ICD-10-CM | POA: Diagnosis not present

## 2015-09-14 DIAGNOSIS — D509 Iron deficiency anemia, unspecified: Secondary | ICD-10-CM | POA: Diagnosis not present

## 2015-09-17 DIAGNOSIS — D631 Anemia in chronic kidney disease: Secondary | ICD-10-CM | POA: Diagnosis not present

## 2015-09-17 DIAGNOSIS — N186 End stage renal disease: Secondary | ICD-10-CM | POA: Diagnosis not present

## 2015-09-17 DIAGNOSIS — E1129 Type 2 diabetes mellitus with other diabetic kidney complication: Secondary | ICD-10-CM | POA: Diagnosis not present

## 2015-09-17 DIAGNOSIS — D509 Iron deficiency anemia, unspecified: Secondary | ICD-10-CM | POA: Diagnosis not present

## 2015-09-17 DIAGNOSIS — N2581 Secondary hyperparathyroidism of renal origin: Secondary | ICD-10-CM | POA: Diagnosis not present

## 2015-09-18 DIAGNOSIS — N186 End stage renal disease: Secondary | ICD-10-CM | POA: Diagnosis not present

## 2015-09-18 DIAGNOSIS — E119 Type 2 diabetes mellitus without complications: Secondary | ICD-10-CM | POA: Diagnosis not present

## 2015-09-18 DIAGNOSIS — E213 Hyperparathyroidism, unspecified: Secondary | ICD-10-CM | POA: Diagnosis not present

## 2015-09-18 DIAGNOSIS — Z7901 Long term (current) use of anticoagulants: Secondary | ICD-10-CM | POA: Diagnosis not present

## 2015-09-18 DIAGNOSIS — E785 Hyperlipidemia, unspecified: Secondary | ICD-10-CM | POA: Diagnosis not present

## 2015-09-18 DIAGNOSIS — I10 Essential (primary) hypertension: Secondary | ICD-10-CM | POA: Diagnosis not present

## 2015-09-19 DIAGNOSIS — E1129 Type 2 diabetes mellitus with other diabetic kidney complication: Secondary | ICD-10-CM | POA: Diagnosis not present

## 2015-09-19 DIAGNOSIS — D631 Anemia in chronic kidney disease: Secondary | ICD-10-CM | POA: Diagnosis not present

## 2015-09-19 DIAGNOSIS — D509 Iron deficiency anemia, unspecified: Secondary | ICD-10-CM | POA: Diagnosis not present

## 2015-09-19 DIAGNOSIS — N2581 Secondary hyperparathyroidism of renal origin: Secondary | ICD-10-CM | POA: Diagnosis not present

## 2015-09-19 DIAGNOSIS — N186 End stage renal disease: Secondary | ICD-10-CM | POA: Diagnosis not present

## 2015-09-21 DIAGNOSIS — N186 End stage renal disease: Secondary | ICD-10-CM | POA: Diagnosis not present

## 2015-09-21 DIAGNOSIS — E1129 Type 2 diabetes mellitus with other diabetic kidney complication: Secondary | ICD-10-CM | POA: Diagnosis not present

## 2015-09-21 DIAGNOSIS — D631 Anemia in chronic kidney disease: Secondary | ICD-10-CM | POA: Diagnosis not present

## 2015-09-21 DIAGNOSIS — N2581 Secondary hyperparathyroidism of renal origin: Secondary | ICD-10-CM | POA: Diagnosis not present

## 2015-09-21 DIAGNOSIS — D509 Iron deficiency anemia, unspecified: Secondary | ICD-10-CM | POA: Diagnosis not present

## 2015-09-23 DIAGNOSIS — E1122 Type 2 diabetes mellitus with diabetic chronic kidney disease: Secondary | ICD-10-CM | POA: Diagnosis not present

## 2015-09-23 DIAGNOSIS — Z794 Long term (current) use of insulin: Secondary | ICD-10-CM | POA: Diagnosis not present

## 2015-09-23 DIAGNOSIS — Z7901 Long term (current) use of anticoagulants: Secondary | ICD-10-CM | POA: Diagnosis not present

## 2015-09-23 DIAGNOSIS — N186 End stage renal disease: Secondary | ICD-10-CM | POA: Diagnosis not present

## 2015-09-23 DIAGNOSIS — M1711 Unilateral primary osteoarthritis, right knee: Secondary | ICD-10-CM | POA: Diagnosis not present

## 2015-09-23 DIAGNOSIS — Z992 Dependence on renal dialysis: Secondary | ICD-10-CM | POA: Diagnosis not present

## 2015-09-23 DIAGNOSIS — R296 Repeated falls: Secondary | ICD-10-CM | POA: Diagnosis not present

## 2015-09-23 DIAGNOSIS — Z9181 History of falling: Secondary | ICD-10-CM | POA: Diagnosis not present

## 2015-09-23 DIAGNOSIS — I129 Hypertensive chronic kidney disease with stage 1 through stage 4 chronic kidney disease, or unspecified chronic kidney disease: Secondary | ICD-10-CM | POA: Diagnosis not present

## 2015-09-24 DIAGNOSIS — D509 Iron deficiency anemia, unspecified: Secondary | ICD-10-CM | POA: Diagnosis not present

## 2015-09-24 DIAGNOSIS — D631 Anemia in chronic kidney disease: Secondary | ICD-10-CM | POA: Diagnosis not present

## 2015-09-24 DIAGNOSIS — N186 End stage renal disease: Secondary | ICD-10-CM | POA: Diagnosis not present

## 2015-09-24 DIAGNOSIS — E1129 Type 2 diabetes mellitus with other diabetic kidney complication: Secondary | ICD-10-CM | POA: Diagnosis not present

## 2015-09-24 DIAGNOSIS — N2581 Secondary hyperparathyroidism of renal origin: Secondary | ICD-10-CM | POA: Diagnosis not present

## 2015-09-25 DIAGNOSIS — M1711 Unilateral primary osteoarthritis, right knee: Secondary | ICD-10-CM | POA: Diagnosis not present

## 2015-09-25 DIAGNOSIS — R296 Repeated falls: Secondary | ICD-10-CM | POA: Diagnosis not present

## 2015-09-25 DIAGNOSIS — Z992 Dependence on renal dialysis: Secondary | ICD-10-CM | POA: Diagnosis not present

## 2015-09-25 DIAGNOSIS — E1122 Type 2 diabetes mellitus with diabetic chronic kidney disease: Secondary | ICD-10-CM | POA: Diagnosis not present

## 2015-09-25 DIAGNOSIS — Z7901 Long term (current) use of anticoagulants: Secondary | ICD-10-CM | POA: Diagnosis not present

## 2015-09-25 DIAGNOSIS — I129 Hypertensive chronic kidney disease with stage 1 through stage 4 chronic kidney disease, or unspecified chronic kidney disease: Secondary | ICD-10-CM | POA: Diagnosis not present

## 2015-09-25 DIAGNOSIS — N186 End stage renal disease: Secondary | ICD-10-CM | POA: Diagnosis not present

## 2015-09-26 DIAGNOSIS — N2581 Secondary hyperparathyroidism of renal origin: Secondary | ICD-10-CM | POA: Diagnosis not present

## 2015-09-26 DIAGNOSIS — E1129 Type 2 diabetes mellitus with other diabetic kidney complication: Secondary | ICD-10-CM | POA: Diagnosis not present

## 2015-09-26 DIAGNOSIS — D509 Iron deficiency anemia, unspecified: Secondary | ICD-10-CM | POA: Diagnosis not present

## 2015-09-26 DIAGNOSIS — N186 End stage renal disease: Secondary | ICD-10-CM | POA: Diagnosis not present

## 2015-09-26 DIAGNOSIS — D631 Anemia in chronic kidney disease: Secondary | ICD-10-CM | POA: Diagnosis not present

## 2015-09-27 DIAGNOSIS — M170 Bilateral primary osteoarthritis of knee: Secondary | ICD-10-CM | POA: Diagnosis not present

## 2015-09-28 DIAGNOSIS — D509 Iron deficiency anemia, unspecified: Secondary | ICD-10-CM | POA: Diagnosis not present

## 2015-09-28 DIAGNOSIS — N2581 Secondary hyperparathyroidism of renal origin: Secondary | ICD-10-CM | POA: Diagnosis not present

## 2015-09-28 DIAGNOSIS — D631 Anemia in chronic kidney disease: Secondary | ICD-10-CM | POA: Diagnosis not present

## 2015-09-28 DIAGNOSIS — N186 End stage renal disease: Secondary | ICD-10-CM | POA: Diagnosis not present

## 2015-09-28 DIAGNOSIS — E1129 Type 2 diabetes mellitus with other diabetic kidney complication: Secondary | ICD-10-CM | POA: Diagnosis not present

## 2015-09-30 DIAGNOSIS — N186 End stage renal disease: Secondary | ICD-10-CM | POA: Diagnosis not present

## 2015-09-30 DIAGNOSIS — E1122 Type 2 diabetes mellitus with diabetic chronic kidney disease: Secondary | ICD-10-CM | POA: Diagnosis not present

## 2015-09-30 DIAGNOSIS — Z992 Dependence on renal dialysis: Secondary | ICD-10-CM | POA: Diagnosis not present

## 2015-09-30 DIAGNOSIS — M1711 Unilateral primary osteoarthritis, right knee: Secondary | ICD-10-CM | POA: Diagnosis not present

## 2015-09-30 DIAGNOSIS — I129 Hypertensive chronic kidney disease with stage 1 through stage 4 chronic kidney disease, or unspecified chronic kidney disease: Secondary | ICD-10-CM | POA: Diagnosis not present

## 2015-09-30 DIAGNOSIS — R296 Repeated falls: Secondary | ICD-10-CM | POA: Diagnosis not present

## 2015-10-01 DIAGNOSIS — D631 Anemia in chronic kidney disease: Secondary | ICD-10-CM | POA: Diagnosis not present

## 2015-10-01 DIAGNOSIS — D509 Iron deficiency anemia, unspecified: Secondary | ICD-10-CM | POA: Diagnosis not present

## 2015-10-01 DIAGNOSIS — E1129 Type 2 diabetes mellitus with other diabetic kidney complication: Secondary | ICD-10-CM | POA: Diagnosis not present

## 2015-10-01 DIAGNOSIS — N186 End stage renal disease: Secondary | ICD-10-CM | POA: Diagnosis not present

## 2015-10-01 DIAGNOSIS — N2581 Secondary hyperparathyroidism of renal origin: Secondary | ICD-10-CM | POA: Diagnosis not present

## 2015-10-02 DIAGNOSIS — I129 Hypertensive chronic kidney disease with stage 1 through stage 4 chronic kidney disease, or unspecified chronic kidney disease: Secondary | ICD-10-CM | POA: Diagnosis not present

## 2015-10-02 DIAGNOSIS — E1122 Type 2 diabetes mellitus with diabetic chronic kidney disease: Secondary | ICD-10-CM | POA: Diagnosis not present

## 2015-10-02 DIAGNOSIS — Z7901 Long term (current) use of anticoagulants: Secondary | ICD-10-CM | POA: Diagnosis not present

## 2015-10-02 DIAGNOSIS — R296 Repeated falls: Secondary | ICD-10-CM | POA: Diagnosis not present

## 2015-10-02 DIAGNOSIS — Z992 Dependence on renal dialysis: Secondary | ICD-10-CM | POA: Diagnosis not present

## 2015-10-02 DIAGNOSIS — M1711 Unilateral primary osteoarthritis, right knee: Secondary | ICD-10-CM | POA: Diagnosis not present

## 2015-10-02 DIAGNOSIS — N186 End stage renal disease: Secondary | ICD-10-CM | POA: Diagnosis not present

## 2015-10-03 DIAGNOSIS — E1122 Type 2 diabetes mellitus with diabetic chronic kidney disease: Secondary | ICD-10-CM | POA: Diagnosis not present

## 2015-10-03 DIAGNOSIS — I129 Hypertensive chronic kidney disease with stage 1 through stage 4 chronic kidney disease, or unspecified chronic kidney disease: Secondary | ICD-10-CM | POA: Diagnosis not present

## 2015-10-03 DIAGNOSIS — R296 Repeated falls: Secondary | ICD-10-CM | POA: Diagnosis not present

## 2015-10-03 DIAGNOSIS — M1711 Unilateral primary osteoarthritis, right knee: Secondary | ICD-10-CM | POA: Diagnosis not present

## 2015-10-03 DIAGNOSIS — Z992 Dependence on renal dialysis: Secondary | ICD-10-CM | POA: Diagnosis not present

## 2015-10-03 DIAGNOSIS — N186 End stage renal disease: Secondary | ICD-10-CM | POA: Diagnosis not present

## 2015-10-04 DIAGNOSIS — E1122 Type 2 diabetes mellitus with diabetic chronic kidney disease: Secondary | ICD-10-CM | POA: Diagnosis not present

## 2015-10-04 DIAGNOSIS — R296 Repeated falls: Secondary | ICD-10-CM | POA: Diagnosis not present

## 2015-10-04 DIAGNOSIS — I129 Hypertensive chronic kidney disease with stage 1 through stage 4 chronic kidney disease, or unspecified chronic kidney disease: Secondary | ICD-10-CM | POA: Diagnosis not present

## 2015-10-04 DIAGNOSIS — Z992 Dependence on renal dialysis: Secondary | ICD-10-CM | POA: Diagnosis not present

## 2015-10-04 DIAGNOSIS — N186 End stage renal disease: Secondary | ICD-10-CM | POA: Diagnosis not present

## 2015-10-04 DIAGNOSIS — M1711 Unilateral primary osteoarthritis, right knee: Secondary | ICD-10-CM | POA: Diagnosis not present

## 2015-10-05 DIAGNOSIS — N186 End stage renal disease: Secondary | ICD-10-CM | POA: Diagnosis not present

## 2015-10-05 DIAGNOSIS — D631 Anemia in chronic kidney disease: Secondary | ICD-10-CM | POA: Diagnosis not present

## 2015-10-05 DIAGNOSIS — N2581 Secondary hyperparathyroidism of renal origin: Secondary | ICD-10-CM | POA: Diagnosis not present

## 2015-10-05 DIAGNOSIS — D509 Iron deficiency anemia, unspecified: Secondary | ICD-10-CM | POA: Diagnosis not present

## 2015-10-05 DIAGNOSIS — E1129 Type 2 diabetes mellitus with other diabetic kidney complication: Secondary | ICD-10-CM | POA: Diagnosis not present

## 2015-10-08 DIAGNOSIS — I69954 Hemiplegia and hemiparesis following unspecified cerebrovascular disease affecting left non-dominant side: Secondary | ICD-10-CM | POA: Diagnosis not present

## 2015-10-08 DIAGNOSIS — E213 Hyperparathyroidism, unspecified: Secondary | ICD-10-CM | POA: Diagnosis not present

## 2015-10-08 DIAGNOSIS — I10 Essential (primary) hypertension: Secondary | ICD-10-CM | POA: Diagnosis not present

## 2015-10-08 DIAGNOSIS — E1129 Type 2 diabetes mellitus with other diabetic kidney complication: Secondary | ICD-10-CM | POA: Diagnosis not present

## 2015-10-08 DIAGNOSIS — E785 Hyperlipidemia, unspecified: Secondary | ICD-10-CM | POA: Diagnosis not present

## 2015-10-08 DIAGNOSIS — N2581 Secondary hyperparathyroidism of renal origin: Secondary | ICD-10-CM | POA: Diagnosis not present

## 2015-10-08 DIAGNOSIS — H409 Unspecified glaucoma: Secondary | ICD-10-CM | POA: Diagnosis not present

## 2015-10-08 DIAGNOSIS — D631 Anemia in chronic kidney disease: Secondary | ICD-10-CM | POA: Diagnosis not present

## 2015-10-08 DIAGNOSIS — E119 Type 2 diabetes mellitus without complications: Secondary | ICD-10-CM | POA: Diagnosis not present

## 2015-10-08 DIAGNOSIS — N186 End stage renal disease: Secondary | ICD-10-CM | POA: Diagnosis not present

## 2015-10-08 DIAGNOSIS — I509 Heart failure, unspecified: Secondary | ICD-10-CM | POA: Diagnosis not present

## 2015-10-08 DIAGNOSIS — D509 Iron deficiency anemia, unspecified: Secondary | ICD-10-CM | POA: Diagnosis not present

## 2015-10-09 DIAGNOSIS — E1122 Type 2 diabetes mellitus with diabetic chronic kidney disease: Secondary | ICD-10-CM | POA: Diagnosis not present

## 2015-10-09 DIAGNOSIS — N186 End stage renal disease: Secondary | ICD-10-CM | POA: Diagnosis not present

## 2015-10-09 DIAGNOSIS — R296 Repeated falls: Secondary | ICD-10-CM | POA: Diagnosis not present

## 2015-10-09 DIAGNOSIS — Z7901 Long term (current) use of anticoagulants: Secondary | ICD-10-CM | POA: Diagnosis not present

## 2015-10-09 DIAGNOSIS — I129 Hypertensive chronic kidney disease with stage 1 through stage 4 chronic kidney disease, or unspecified chronic kidney disease: Secondary | ICD-10-CM | POA: Diagnosis not present

## 2015-10-09 DIAGNOSIS — Z992 Dependence on renal dialysis: Secondary | ICD-10-CM | POA: Diagnosis not present

## 2015-10-09 DIAGNOSIS — M1711 Unilateral primary osteoarthritis, right knee: Secondary | ICD-10-CM | POA: Diagnosis not present

## 2015-10-10 DIAGNOSIS — E1129 Type 2 diabetes mellitus with other diabetic kidney complication: Secondary | ICD-10-CM | POA: Diagnosis not present

## 2015-10-10 DIAGNOSIS — N2581 Secondary hyperparathyroidism of renal origin: Secondary | ICD-10-CM | POA: Diagnosis not present

## 2015-10-10 DIAGNOSIS — D509 Iron deficiency anemia, unspecified: Secondary | ICD-10-CM | POA: Diagnosis not present

## 2015-10-10 DIAGNOSIS — D631 Anemia in chronic kidney disease: Secondary | ICD-10-CM | POA: Diagnosis not present

## 2015-10-10 DIAGNOSIS — N186 End stage renal disease: Secondary | ICD-10-CM | POA: Diagnosis not present

## 2015-10-11 DIAGNOSIS — R296 Repeated falls: Secondary | ICD-10-CM | POA: Diagnosis not present

## 2015-10-11 DIAGNOSIS — Z992 Dependence on renal dialysis: Secondary | ICD-10-CM | POA: Diagnosis not present

## 2015-10-11 DIAGNOSIS — M1711 Unilateral primary osteoarthritis, right knee: Secondary | ICD-10-CM | POA: Diagnosis not present

## 2015-10-11 DIAGNOSIS — E1122 Type 2 diabetes mellitus with diabetic chronic kidney disease: Secondary | ICD-10-CM | POA: Diagnosis not present

## 2015-10-11 DIAGNOSIS — N186 End stage renal disease: Secondary | ICD-10-CM | POA: Diagnosis not present

## 2015-10-11 DIAGNOSIS — I129 Hypertensive chronic kidney disease with stage 1 through stage 4 chronic kidney disease, or unspecified chronic kidney disease: Secondary | ICD-10-CM | POA: Diagnosis not present

## 2015-10-12 DIAGNOSIS — N2581 Secondary hyperparathyroidism of renal origin: Secondary | ICD-10-CM | POA: Diagnosis not present

## 2015-10-12 DIAGNOSIS — D509 Iron deficiency anemia, unspecified: Secondary | ICD-10-CM | POA: Diagnosis not present

## 2015-10-12 DIAGNOSIS — E1129 Type 2 diabetes mellitus with other diabetic kidney complication: Secondary | ICD-10-CM | POA: Diagnosis not present

## 2015-10-12 DIAGNOSIS — D631 Anemia in chronic kidney disease: Secondary | ICD-10-CM | POA: Diagnosis not present

## 2015-10-12 DIAGNOSIS — N186 End stage renal disease: Secondary | ICD-10-CM | POA: Diagnosis not present

## 2015-10-14 DIAGNOSIS — E1129 Type 2 diabetes mellitus with other diabetic kidney complication: Secondary | ICD-10-CM | POA: Diagnosis not present

## 2015-10-14 DIAGNOSIS — N186 End stage renal disease: Secondary | ICD-10-CM | POA: Diagnosis not present

## 2015-10-14 DIAGNOSIS — Z992 Dependence on renal dialysis: Secondary | ICD-10-CM | POA: Diagnosis not present

## 2015-10-15 DIAGNOSIS — N186 End stage renal disease: Secondary | ICD-10-CM | POA: Diagnosis not present

## 2015-10-15 DIAGNOSIS — N2581 Secondary hyperparathyroidism of renal origin: Secondary | ICD-10-CM | POA: Diagnosis not present

## 2015-10-15 DIAGNOSIS — D509 Iron deficiency anemia, unspecified: Secondary | ICD-10-CM | POA: Diagnosis not present

## 2015-10-15 DIAGNOSIS — D631 Anemia in chronic kidney disease: Secondary | ICD-10-CM | POA: Diagnosis not present

## 2015-10-15 DIAGNOSIS — E1129 Type 2 diabetes mellitus with other diabetic kidney complication: Secondary | ICD-10-CM | POA: Diagnosis not present

## 2015-10-16 DIAGNOSIS — Z7901 Long term (current) use of anticoagulants: Secondary | ICD-10-CM | POA: Diagnosis not present

## 2015-10-16 DIAGNOSIS — E119 Type 2 diabetes mellitus without complications: Secondary | ICD-10-CM | POA: Diagnosis not present

## 2015-10-16 DIAGNOSIS — I129 Hypertensive chronic kidney disease with stage 1 through stage 4 chronic kidney disease, or unspecified chronic kidney disease: Secondary | ICD-10-CM | POA: Diagnosis not present

## 2015-10-16 DIAGNOSIS — R296 Repeated falls: Secondary | ICD-10-CM | POA: Diagnosis not present

## 2015-10-16 DIAGNOSIS — E1122 Type 2 diabetes mellitus with diabetic chronic kidney disease: Secondary | ICD-10-CM | POA: Diagnosis not present

## 2015-10-16 DIAGNOSIS — M1711 Unilateral primary osteoarthritis, right knee: Secondary | ICD-10-CM | POA: Diagnosis not present

## 2015-10-16 DIAGNOSIS — Z79899 Other long term (current) drug therapy: Secondary | ICD-10-CM | POA: Diagnosis not present

## 2015-10-16 DIAGNOSIS — Z992 Dependence on renal dialysis: Secondary | ICD-10-CM | POA: Diagnosis not present

## 2015-10-16 DIAGNOSIS — N186 End stage renal disease: Secondary | ICD-10-CM | POA: Diagnosis not present

## 2015-10-18 DIAGNOSIS — E1122 Type 2 diabetes mellitus with diabetic chronic kidney disease: Secondary | ICD-10-CM | POA: Diagnosis not present

## 2015-10-18 DIAGNOSIS — I129 Hypertensive chronic kidney disease with stage 1 through stage 4 chronic kidney disease, or unspecified chronic kidney disease: Secondary | ICD-10-CM | POA: Diagnosis not present

## 2015-10-18 DIAGNOSIS — M1711 Unilateral primary osteoarthritis, right knee: Secondary | ICD-10-CM | POA: Diagnosis not present

## 2015-10-18 DIAGNOSIS — Z992 Dependence on renal dialysis: Secondary | ICD-10-CM | POA: Diagnosis not present

## 2015-10-18 DIAGNOSIS — N186 End stage renal disease: Secondary | ICD-10-CM | POA: Diagnosis not present

## 2015-10-18 DIAGNOSIS — R296 Repeated falls: Secondary | ICD-10-CM | POA: Diagnosis not present

## 2015-10-21 DIAGNOSIS — E1122 Type 2 diabetes mellitus with diabetic chronic kidney disease: Secondary | ICD-10-CM | POA: Diagnosis not present

## 2015-10-21 DIAGNOSIS — R296 Repeated falls: Secondary | ICD-10-CM | POA: Diagnosis not present

## 2015-10-21 DIAGNOSIS — M1711 Unilateral primary osteoarthritis, right knee: Secondary | ICD-10-CM | POA: Diagnosis not present

## 2015-10-21 DIAGNOSIS — N186 End stage renal disease: Secondary | ICD-10-CM | POA: Diagnosis not present

## 2015-10-21 DIAGNOSIS — Z992 Dependence on renal dialysis: Secondary | ICD-10-CM | POA: Diagnosis not present

## 2015-10-21 DIAGNOSIS — I129 Hypertensive chronic kidney disease with stage 1 through stage 4 chronic kidney disease, or unspecified chronic kidney disease: Secondary | ICD-10-CM | POA: Diagnosis not present

## 2015-10-23 DIAGNOSIS — N186 End stage renal disease: Secondary | ICD-10-CM | POA: Diagnosis not present

## 2015-10-23 DIAGNOSIS — Z992 Dependence on renal dialysis: Secondary | ICD-10-CM | POA: Diagnosis not present

## 2015-10-23 DIAGNOSIS — E1122 Type 2 diabetes mellitus with diabetic chronic kidney disease: Secondary | ICD-10-CM | POA: Diagnosis not present

## 2015-10-23 DIAGNOSIS — Z7901 Long term (current) use of anticoagulants: Secondary | ICD-10-CM | POA: Diagnosis not present

## 2015-10-23 DIAGNOSIS — M1711 Unilateral primary osteoarthritis, right knee: Secondary | ICD-10-CM | POA: Diagnosis not present

## 2015-10-23 DIAGNOSIS — R296 Repeated falls: Secondary | ICD-10-CM | POA: Diagnosis not present

## 2015-10-23 DIAGNOSIS — I129 Hypertensive chronic kidney disease with stage 1 through stage 4 chronic kidney disease, or unspecified chronic kidney disease: Secondary | ICD-10-CM | POA: Diagnosis not present

## 2015-10-29 DIAGNOSIS — E1122 Type 2 diabetes mellitus with diabetic chronic kidney disease: Secondary | ICD-10-CM | POA: Diagnosis not present

## 2015-10-29 DIAGNOSIS — M1711 Unilateral primary osteoarthritis, right knee: Secondary | ICD-10-CM | POA: Diagnosis not present

## 2015-10-29 DIAGNOSIS — N186 End stage renal disease: Secondary | ICD-10-CM | POA: Diagnosis not present

## 2015-10-29 DIAGNOSIS — Z992 Dependence on renal dialysis: Secondary | ICD-10-CM | POA: Diagnosis not present

## 2015-10-29 DIAGNOSIS — R296 Repeated falls: Secondary | ICD-10-CM | POA: Diagnosis not present

## 2015-10-29 DIAGNOSIS — I129 Hypertensive chronic kidney disease with stage 1 through stage 4 chronic kidney disease, or unspecified chronic kidney disease: Secondary | ICD-10-CM | POA: Diagnosis not present

## 2015-10-30 DIAGNOSIS — Z7901 Long term (current) use of anticoagulants: Secondary | ICD-10-CM | POA: Diagnosis not present

## 2015-11-01 DIAGNOSIS — E1122 Type 2 diabetes mellitus with diabetic chronic kidney disease: Secondary | ICD-10-CM | POA: Diagnosis not present

## 2015-11-01 DIAGNOSIS — R296 Repeated falls: Secondary | ICD-10-CM | POA: Diagnosis not present

## 2015-11-01 DIAGNOSIS — I129 Hypertensive chronic kidney disease with stage 1 through stage 4 chronic kidney disease, or unspecified chronic kidney disease: Secondary | ICD-10-CM | POA: Diagnosis not present

## 2015-11-01 DIAGNOSIS — N186 End stage renal disease: Secondary | ICD-10-CM | POA: Diagnosis not present

## 2015-11-01 DIAGNOSIS — Z992 Dependence on renal dialysis: Secondary | ICD-10-CM | POA: Diagnosis not present

## 2015-11-01 DIAGNOSIS — M1711 Unilateral primary osteoarthritis, right knee: Secondary | ICD-10-CM | POA: Diagnosis not present

## 2015-11-04 DIAGNOSIS — N186 End stage renal disease: Secondary | ICD-10-CM | POA: Diagnosis not present

## 2015-11-04 DIAGNOSIS — E1122 Type 2 diabetes mellitus with diabetic chronic kidney disease: Secondary | ICD-10-CM | POA: Diagnosis not present

## 2015-11-04 DIAGNOSIS — R296 Repeated falls: Secondary | ICD-10-CM | POA: Diagnosis not present

## 2015-11-04 DIAGNOSIS — I129 Hypertensive chronic kidney disease with stage 1 through stage 4 chronic kidney disease, or unspecified chronic kidney disease: Secondary | ICD-10-CM | POA: Diagnosis not present

## 2015-11-04 DIAGNOSIS — M1711 Unilateral primary osteoarthritis, right knee: Secondary | ICD-10-CM | POA: Diagnosis not present

## 2015-11-04 DIAGNOSIS — Z992 Dependence on renal dialysis: Secondary | ICD-10-CM | POA: Diagnosis not present

## 2015-11-06 DIAGNOSIS — Z7901 Long term (current) use of anticoagulants: Secondary | ICD-10-CM | POA: Diagnosis not present

## 2015-11-08 DIAGNOSIS — M1711 Unilateral primary osteoarthritis, right knee: Secondary | ICD-10-CM | POA: Diagnosis not present

## 2015-11-08 DIAGNOSIS — Z992 Dependence on renal dialysis: Secondary | ICD-10-CM | POA: Diagnosis not present

## 2015-11-08 DIAGNOSIS — R296 Repeated falls: Secondary | ICD-10-CM | POA: Diagnosis not present

## 2015-11-08 DIAGNOSIS — N186 End stage renal disease: Secondary | ICD-10-CM | POA: Diagnosis not present

## 2015-11-08 DIAGNOSIS — E1122 Type 2 diabetes mellitus with diabetic chronic kidney disease: Secondary | ICD-10-CM | POA: Diagnosis not present

## 2015-11-08 DIAGNOSIS — I129 Hypertensive chronic kidney disease with stage 1 through stage 4 chronic kidney disease, or unspecified chronic kidney disease: Secondary | ICD-10-CM | POA: Diagnosis not present

## 2015-11-11 DIAGNOSIS — R296 Repeated falls: Secondary | ICD-10-CM | POA: Diagnosis not present

## 2015-11-11 DIAGNOSIS — E1122 Type 2 diabetes mellitus with diabetic chronic kidney disease: Secondary | ICD-10-CM | POA: Diagnosis not present

## 2015-11-11 DIAGNOSIS — Z992 Dependence on renal dialysis: Secondary | ICD-10-CM | POA: Diagnosis not present

## 2015-11-11 DIAGNOSIS — I129 Hypertensive chronic kidney disease with stage 1 through stage 4 chronic kidney disease, or unspecified chronic kidney disease: Secondary | ICD-10-CM | POA: Diagnosis not present

## 2015-11-11 DIAGNOSIS — N186 End stage renal disease: Secondary | ICD-10-CM | POA: Diagnosis not present

## 2015-11-11 DIAGNOSIS — M1711 Unilateral primary osteoarthritis, right knee: Secondary | ICD-10-CM | POA: Diagnosis not present

## 2015-11-12 DIAGNOSIS — H409 Unspecified glaucoma: Secondary | ICD-10-CM | POA: Diagnosis not present

## 2015-11-12 DIAGNOSIS — E119 Type 2 diabetes mellitus without complications: Secondary | ICD-10-CM | POA: Diagnosis not present

## 2015-11-12 DIAGNOSIS — N186 End stage renal disease: Secondary | ICD-10-CM | POA: Diagnosis not present

## 2015-11-12 DIAGNOSIS — I10 Essential (primary) hypertension: Secondary | ICD-10-CM | POA: Diagnosis not present

## 2015-11-12 DIAGNOSIS — E785 Hyperlipidemia, unspecified: Secondary | ICD-10-CM | POA: Diagnosis not present

## 2015-11-12 DIAGNOSIS — E213 Hyperparathyroidism, unspecified: Secondary | ICD-10-CM | POA: Diagnosis not present

## 2015-11-12 DIAGNOSIS — I509 Heart failure, unspecified: Secondary | ICD-10-CM | POA: Diagnosis not present

## 2015-11-12 DIAGNOSIS — Z86718 Personal history of other venous thrombosis and embolism: Secondary | ICD-10-CM | POA: Diagnosis not present

## 2015-11-13 DIAGNOSIS — R262 Difficulty in walking, not elsewhere classified: Secondary | ICD-10-CM | POA: Diagnosis not present

## 2015-11-13 DIAGNOSIS — L600 Ingrowing nail: Secondary | ICD-10-CM | POA: Diagnosis not present

## 2015-11-13 DIAGNOSIS — M79673 Pain in unspecified foot: Secondary | ICD-10-CM | POA: Diagnosis not present

## 2015-11-13 DIAGNOSIS — B351 Tinea unguium: Secondary | ICD-10-CM | POA: Diagnosis not present

## 2015-11-13 DIAGNOSIS — Z7901 Long term (current) use of anticoagulants: Secondary | ICD-10-CM | POA: Diagnosis not present

## 2015-11-14 DIAGNOSIS — Z992 Dependence on renal dialysis: Secondary | ICD-10-CM | POA: Diagnosis not present

## 2015-11-14 DIAGNOSIS — E1129 Type 2 diabetes mellitus with other diabetic kidney complication: Secondary | ICD-10-CM | POA: Diagnosis not present

## 2015-11-14 DIAGNOSIS — N186 End stage renal disease: Secondary | ICD-10-CM | POA: Diagnosis not present

## 2015-11-15 DIAGNOSIS — I129 Hypertensive chronic kidney disease with stage 1 through stage 4 chronic kidney disease, or unspecified chronic kidney disease: Secondary | ICD-10-CM | POA: Diagnosis not present

## 2015-11-15 DIAGNOSIS — E1122 Type 2 diabetes mellitus with diabetic chronic kidney disease: Secondary | ICD-10-CM | POA: Diagnosis not present

## 2015-11-15 DIAGNOSIS — M1711 Unilateral primary osteoarthritis, right knee: Secondary | ICD-10-CM | POA: Diagnosis not present

## 2015-11-15 DIAGNOSIS — R296 Repeated falls: Secondary | ICD-10-CM | POA: Diagnosis not present

## 2015-11-15 DIAGNOSIS — Z992 Dependence on renal dialysis: Secondary | ICD-10-CM | POA: Diagnosis not present

## 2015-11-15 DIAGNOSIS — N186 End stage renal disease: Secondary | ICD-10-CM | POA: Diagnosis not present

## 2015-11-16 DIAGNOSIS — D631 Anemia in chronic kidney disease: Secondary | ICD-10-CM | POA: Diagnosis not present

## 2015-11-16 DIAGNOSIS — Z23 Encounter for immunization: Secondary | ICD-10-CM | POA: Diagnosis not present

## 2015-11-16 DIAGNOSIS — E1129 Type 2 diabetes mellitus with other diabetic kidney complication: Secondary | ICD-10-CM | POA: Diagnosis not present

## 2015-11-16 DIAGNOSIS — N186 End stage renal disease: Secondary | ICD-10-CM | POA: Diagnosis not present

## 2015-11-16 DIAGNOSIS — N2581 Secondary hyperparathyroidism of renal origin: Secondary | ICD-10-CM | POA: Diagnosis not present

## 2015-11-16 DIAGNOSIS — D509 Iron deficiency anemia, unspecified: Secondary | ICD-10-CM | POA: Diagnosis not present

## 2015-11-19 DIAGNOSIS — E1129 Type 2 diabetes mellitus with other diabetic kidney complication: Secondary | ICD-10-CM | POA: Diagnosis not present

## 2015-11-19 DIAGNOSIS — D631 Anemia in chronic kidney disease: Secondary | ICD-10-CM | POA: Diagnosis not present

## 2015-11-19 DIAGNOSIS — D509 Iron deficiency anemia, unspecified: Secondary | ICD-10-CM | POA: Diagnosis not present

## 2015-11-19 DIAGNOSIS — N2581 Secondary hyperparathyroidism of renal origin: Secondary | ICD-10-CM | POA: Diagnosis not present

## 2015-11-19 DIAGNOSIS — N186 End stage renal disease: Secondary | ICD-10-CM | POA: Diagnosis not present

## 2015-11-19 DIAGNOSIS — Z23 Encounter for immunization: Secondary | ICD-10-CM | POA: Diagnosis not present

## 2015-11-20 DIAGNOSIS — E1122 Type 2 diabetes mellitus with diabetic chronic kidney disease: Secondary | ICD-10-CM | POA: Diagnosis not present

## 2015-11-20 DIAGNOSIS — Z992 Dependence on renal dialysis: Secondary | ICD-10-CM | POA: Diagnosis not present

## 2015-11-20 DIAGNOSIS — M1711 Unilateral primary osteoarthritis, right knee: Secondary | ICD-10-CM | POA: Diagnosis not present

## 2015-11-20 DIAGNOSIS — I129 Hypertensive chronic kidney disease with stage 1 through stage 4 chronic kidney disease, or unspecified chronic kidney disease: Secondary | ICD-10-CM | POA: Diagnosis not present

## 2015-11-20 DIAGNOSIS — Z7901 Long term (current) use of anticoagulants: Secondary | ICD-10-CM | POA: Diagnosis not present

## 2015-11-20 DIAGNOSIS — H4311 Vitreous hemorrhage, right eye: Secondary | ICD-10-CM | POA: Diagnosis not present

## 2015-11-20 DIAGNOSIS — H3341 Traction detachment of retina, right eye: Secondary | ICD-10-CM | POA: Diagnosis not present

## 2015-11-20 DIAGNOSIS — E113553 Type 2 diabetes mellitus with stable proliferative diabetic retinopathy, bilateral: Secondary | ICD-10-CM | POA: Diagnosis not present

## 2015-11-20 DIAGNOSIS — R296 Repeated falls: Secondary | ICD-10-CM | POA: Diagnosis not present

## 2015-11-20 DIAGNOSIS — N186 End stage renal disease: Secondary | ICD-10-CM | POA: Diagnosis not present

## 2015-11-20 DIAGNOSIS — H26491 Other secondary cataract, right eye: Secondary | ICD-10-CM | POA: Diagnosis not present

## 2015-11-21 DIAGNOSIS — D509 Iron deficiency anemia, unspecified: Secondary | ICD-10-CM | POA: Diagnosis not present

## 2015-11-21 DIAGNOSIS — Z23 Encounter for immunization: Secondary | ICD-10-CM | POA: Diagnosis not present

## 2015-11-21 DIAGNOSIS — N2581 Secondary hyperparathyroidism of renal origin: Secondary | ICD-10-CM | POA: Diagnosis not present

## 2015-11-21 DIAGNOSIS — E1129 Type 2 diabetes mellitus with other diabetic kidney complication: Secondary | ICD-10-CM | POA: Diagnosis not present

## 2015-11-21 DIAGNOSIS — D631 Anemia in chronic kidney disease: Secondary | ICD-10-CM | POA: Diagnosis not present

## 2015-11-21 DIAGNOSIS — N186 End stage renal disease: Secondary | ICD-10-CM | POA: Diagnosis not present

## 2015-11-23 DIAGNOSIS — N186 End stage renal disease: Secondary | ICD-10-CM | POA: Diagnosis not present

## 2015-11-23 DIAGNOSIS — D631 Anemia in chronic kidney disease: Secondary | ICD-10-CM | POA: Diagnosis not present

## 2015-11-23 DIAGNOSIS — Z23 Encounter for immunization: Secondary | ICD-10-CM | POA: Diagnosis not present

## 2015-11-23 DIAGNOSIS — D509 Iron deficiency anemia, unspecified: Secondary | ICD-10-CM | POA: Diagnosis not present

## 2015-11-23 DIAGNOSIS — N2581 Secondary hyperparathyroidism of renal origin: Secondary | ICD-10-CM | POA: Diagnosis not present

## 2015-11-23 DIAGNOSIS — E1129 Type 2 diabetes mellitus with other diabetic kidney complication: Secondary | ICD-10-CM | POA: Diagnosis not present

## 2015-11-25 DIAGNOSIS — N186 End stage renal disease: Secondary | ICD-10-CM | POA: Diagnosis not present

## 2015-11-25 DIAGNOSIS — I871 Compression of vein: Secondary | ICD-10-CM | POA: Diagnosis not present

## 2015-11-25 DIAGNOSIS — Z992 Dependence on renal dialysis: Secondary | ICD-10-CM | POA: Diagnosis not present

## 2015-11-25 DIAGNOSIS — T82868D Thrombosis of vascular prosthetic devices, implants and grafts, subsequent encounter: Secondary | ICD-10-CM | POA: Diagnosis not present

## 2015-11-26 DIAGNOSIS — E1129 Type 2 diabetes mellitus with other diabetic kidney complication: Secondary | ICD-10-CM | POA: Diagnosis not present

## 2015-11-26 DIAGNOSIS — N2581 Secondary hyperparathyroidism of renal origin: Secondary | ICD-10-CM | POA: Diagnosis not present

## 2015-11-26 DIAGNOSIS — N186 End stage renal disease: Secondary | ICD-10-CM | POA: Diagnosis not present

## 2015-11-26 DIAGNOSIS — D509 Iron deficiency anemia, unspecified: Secondary | ICD-10-CM | POA: Diagnosis not present

## 2015-11-26 DIAGNOSIS — Z23 Encounter for immunization: Secondary | ICD-10-CM | POA: Diagnosis not present

## 2015-11-26 DIAGNOSIS — D631 Anemia in chronic kidney disease: Secondary | ICD-10-CM | POA: Diagnosis not present

## 2015-11-27 DIAGNOSIS — Z7901 Long term (current) use of anticoagulants: Secondary | ICD-10-CM | POA: Diagnosis not present

## 2015-11-28 DIAGNOSIS — N2581 Secondary hyperparathyroidism of renal origin: Secondary | ICD-10-CM | POA: Diagnosis not present

## 2015-11-28 DIAGNOSIS — D631 Anemia in chronic kidney disease: Secondary | ICD-10-CM | POA: Diagnosis not present

## 2015-11-28 DIAGNOSIS — Z23 Encounter for immunization: Secondary | ICD-10-CM | POA: Diagnosis not present

## 2015-11-28 DIAGNOSIS — N186 End stage renal disease: Secondary | ICD-10-CM | POA: Diagnosis not present

## 2015-11-28 DIAGNOSIS — D509 Iron deficiency anemia, unspecified: Secondary | ICD-10-CM | POA: Diagnosis not present

## 2015-11-28 DIAGNOSIS — E1129 Type 2 diabetes mellitus with other diabetic kidney complication: Secondary | ICD-10-CM | POA: Diagnosis not present

## 2015-11-30 DIAGNOSIS — E1129 Type 2 diabetes mellitus with other diabetic kidney complication: Secondary | ICD-10-CM | POA: Diagnosis not present

## 2015-11-30 DIAGNOSIS — N186 End stage renal disease: Secondary | ICD-10-CM | POA: Diagnosis not present

## 2015-11-30 DIAGNOSIS — Z23 Encounter for immunization: Secondary | ICD-10-CM | POA: Diagnosis not present

## 2015-11-30 DIAGNOSIS — D509 Iron deficiency anemia, unspecified: Secondary | ICD-10-CM | POA: Diagnosis not present

## 2015-11-30 DIAGNOSIS — N2581 Secondary hyperparathyroidism of renal origin: Secondary | ICD-10-CM | POA: Diagnosis not present

## 2015-11-30 DIAGNOSIS — D631 Anemia in chronic kidney disease: Secondary | ICD-10-CM | POA: Diagnosis not present

## 2015-12-03 DIAGNOSIS — N186 End stage renal disease: Secondary | ICD-10-CM | POA: Diagnosis not present

## 2015-12-03 DIAGNOSIS — D509 Iron deficiency anemia, unspecified: Secondary | ICD-10-CM | POA: Diagnosis not present

## 2015-12-03 DIAGNOSIS — Z23 Encounter for immunization: Secondary | ICD-10-CM | POA: Diagnosis not present

## 2015-12-03 DIAGNOSIS — E1129 Type 2 diabetes mellitus with other diabetic kidney complication: Secondary | ICD-10-CM | POA: Diagnosis not present

## 2015-12-03 DIAGNOSIS — D631 Anemia in chronic kidney disease: Secondary | ICD-10-CM | POA: Diagnosis not present

## 2015-12-03 DIAGNOSIS — N2581 Secondary hyperparathyroidism of renal origin: Secondary | ICD-10-CM | POA: Diagnosis not present

## 2015-12-04 DIAGNOSIS — Z7901 Long term (current) use of anticoagulants: Secondary | ICD-10-CM | POA: Diagnosis not present

## 2015-12-05 DIAGNOSIS — E1129 Type 2 diabetes mellitus with other diabetic kidney complication: Secondary | ICD-10-CM | POA: Diagnosis not present

## 2015-12-05 DIAGNOSIS — N186 End stage renal disease: Secondary | ICD-10-CM | POA: Diagnosis not present

## 2015-12-05 DIAGNOSIS — D509 Iron deficiency anemia, unspecified: Secondary | ICD-10-CM | POA: Diagnosis not present

## 2015-12-05 DIAGNOSIS — N2581 Secondary hyperparathyroidism of renal origin: Secondary | ICD-10-CM | POA: Diagnosis not present

## 2015-12-05 DIAGNOSIS — D631 Anemia in chronic kidney disease: Secondary | ICD-10-CM | POA: Diagnosis not present

## 2015-12-05 DIAGNOSIS — Z23 Encounter for immunization: Secondary | ICD-10-CM | POA: Diagnosis not present

## 2015-12-07 DIAGNOSIS — D509 Iron deficiency anemia, unspecified: Secondary | ICD-10-CM | POA: Diagnosis not present

## 2015-12-07 DIAGNOSIS — N186 End stage renal disease: Secondary | ICD-10-CM | POA: Diagnosis not present

## 2015-12-07 DIAGNOSIS — D631 Anemia in chronic kidney disease: Secondary | ICD-10-CM | POA: Diagnosis not present

## 2015-12-07 DIAGNOSIS — E1129 Type 2 diabetes mellitus with other diabetic kidney complication: Secondary | ICD-10-CM | POA: Diagnosis not present

## 2015-12-07 DIAGNOSIS — N2581 Secondary hyperparathyroidism of renal origin: Secondary | ICD-10-CM | POA: Diagnosis not present

## 2015-12-07 DIAGNOSIS — Z23 Encounter for immunization: Secondary | ICD-10-CM | POA: Diagnosis not present

## 2015-12-10 DIAGNOSIS — N186 End stage renal disease: Secondary | ICD-10-CM | POA: Diagnosis not present

## 2015-12-10 DIAGNOSIS — I69954 Hemiplegia and hemiparesis following unspecified cerebrovascular disease affecting left non-dominant side: Secondary | ICD-10-CM | POA: Diagnosis not present

## 2015-12-10 DIAGNOSIS — N2581 Secondary hyperparathyroidism of renal origin: Secondary | ICD-10-CM | POA: Diagnosis not present

## 2015-12-10 DIAGNOSIS — E1129 Type 2 diabetes mellitus with other diabetic kidney complication: Secondary | ICD-10-CM | POA: Diagnosis not present

## 2015-12-10 DIAGNOSIS — I10 Essential (primary) hypertension: Secondary | ICD-10-CM | POA: Diagnosis not present

## 2015-12-10 DIAGNOSIS — I509 Heart failure, unspecified: Secondary | ICD-10-CM | POA: Diagnosis not present

## 2015-12-10 DIAGNOSIS — Z23 Encounter for immunization: Secondary | ICD-10-CM | POA: Diagnosis not present

## 2015-12-10 DIAGNOSIS — G2581 Restless legs syndrome: Secondary | ICD-10-CM | POA: Diagnosis not present

## 2015-12-10 DIAGNOSIS — M25569 Pain in unspecified knee: Secondary | ICD-10-CM | POA: Diagnosis not present

## 2015-12-10 DIAGNOSIS — Z86718 Personal history of other venous thrombosis and embolism: Secondary | ICD-10-CM | POA: Diagnosis not present

## 2015-12-10 DIAGNOSIS — D509 Iron deficiency anemia, unspecified: Secondary | ICD-10-CM | POA: Diagnosis not present

## 2015-12-10 DIAGNOSIS — M1990 Unspecified osteoarthritis, unspecified site: Secondary | ICD-10-CM | POA: Diagnosis not present

## 2015-12-10 DIAGNOSIS — D631 Anemia in chronic kidney disease: Secondary | ICD-10-CM | POA: Diagnosis not present

## 2015-12-11 DIAGNOSIS — H33021 Retinal detachment with multiple breaks, right eye: Secondary | ICD-10-CM | POA: Diagnosis not present

## 2015-12-11 DIAGNOSIS — H3341 Traction detachment of retina, right eye: Secondary | ICD-10-CM | POA: Diagnosis not present

## 2015-12-11 DIAGNOSIS — E113541 Type 2 diabetes mellitus with proliferative diabetic retinopathy with combined traction retinal detachment and rhegmatogenous retinal detachment, right eye: Secondary | ICD-10-CM | POA: Diagnosis not present

## 2015-12-11 DIAGNOSIS — E113521 Type 2 diabetes mellitus with proliferative diabetic retinopathy with traction retinal detachment involving the macula, right eye: Secondary | ICD-10-CM | POA: Diagnosis not present

## 2015-12-11 DIAGNOSIS — E113531 Type 2 diabetes mellitus with proliferative diabetic retinopathy with traction retinal detachment not involving the macula, right eye: Secondary | ICD-10-CM | POA: Diagnosis not present

## 2015-12-12 DIAGNOSIS — N186 End stage renal disease: Secondary | ICD-10-CM | POA: Diagnosis not present

## 2015-12-12 DIAGNOSIS — Z23 Encounter for immunization: Secondary | ICD-10-CM | POA: Diagnosis not present

## 2015-12-12 DIAGNOSIS — D509 Iron deficiency anemia, unspecified: Secondary | ICD-10-CM | POA: Diagnosis not present

## 2015-12-12 DIAGNOSIS — N2581 Secondary hyperparathyroidism of renal origin: Secondary | ICD-10-CM | POA: Diagnosis not present

## 2015-12-12 DIAGNOSIS — E1129 Type 2 diabetes mellitus with other diabetic kidney complication: Secondary | ICD-10-CM | POA: Diagnosis not present

## 2015-12-12 DIAGNOSIS — D631 Anemia in chronic kidney disease: Secondary | ICD-10-CM | POA: Diagnosis not present

## 2015-12-13 DIAGNOSIS — Z7901 Long term (current) use of anticoagulants: Secondary | ICD-10-CM | POA: Diagnosis not present

## 2015-12-14 DIAGNOSIS — Z992 Dependence on renal dialysis: Secondary | ICD-10-CM | POA: Diagnosis not present

## 2015-12-14 DIAGNOSIS — D509 Iron deficiency anemia, unspecified: Secondary | ICD-10-CM | POA: Diagnosis not present

## 2015-12-14 DIAGNOSIS — D631 Anemia in chronic kidney disease: Secondary | ICD-10-CM | POA: Diagnosis not present

## 2015-12-14 DIAGNOSIS — N186 End stage renal disease: Secondary | ICD-10-CM | POA: Diagnosis not present

## 2015-12-14 DIAGNOSIS — E1129 Type 2 diabetes mellitus with other diabetic kidney complication: Secondary | ICD-10-CM | POA: Diagnosis not present

## 2015-12-14 DIAGNOSIS — N2581 Secondary hyperparathyroidism of renal origin: Secondary | ICD-10-CM | POA: Diagnosis not present

## 2015-12-14 DIAGNOSIS — Z23 Encounter for immunization: Secondary | ICD-10-CM | POA: Diagnosis not present

## 2015-12-17 DIAGNOSIS — D631 Anemia in chronic kidney disease: Secondary | ICD-10-CM | POA: Diagnosis not present

## 2015-12-17 DIAGNOSIS — N2581 Secondary hyperparathyroidism of renal origin: Secondary | ICD-10-CM | POA: Diagnosis not present

## 2015-12-17 DIAGNOSIS — N186 End stage renal disease: Secondary | ICD-10-CM | POA: Diagnosis not present

## 2015-12-17 DIAGNOSIS — D509 Iron deficiency anemia, unspecified: Secondary | ICD-10-CM | POA: Diagnosis not present

## 2015-12-17 DIAGNOSIS — E1129 Type 2 diabetes mellitus with other diabetic kidney complication: Secondary | ICD-10-CM | POA: Diagnosis not present

## 2015-12-18 DIAGNOSIS — Z7901 Long term (current) use of anticoagulants: Secondary | ICD-10-CM | POA: Diagnosis not present

## 2015-12-19 DIAGNOSIS — E1129 Type 2 diabetes mellitus with other diabetic kidney complication: Secondary | ICD-10-CM | POA: Diagnosis not present

## 2015-12-19 DIAGNOSIS — N2581 Secondary hyperparathyroidism of renal origin: Secondary | ICD-10-CM | POA: Diagnosis not present

## 2015-12-19 DIAGNOSIS — N186 End stage renal disease: Secondary | ICD-10-CM | POA: Diagnosis not present

## 2015-12-19 DIAGNOSIS — D509 Iron deficiency anemia, unspecified: Secondary | ICD-10-CM | POA: Diagnosis not present

## 2015-12-19 DIAGNOSIS — D631 Anemia in chronic kidney disease: Secondary | ICD-10-CM | POA: Diagnosis not present

## 2015-12-20 DIAGNOSIS — M1711 Unilateral primary osteoarthritis, right knee: Secondary | ICD-10-CM | POA: Diagnosis not present

## 2015-12-21 DIAGNOSIS — D631 Anemia in chronic kidney disease: Secondary | ICD-10-CM | POA: Diagnosis not present

## 2015-12-21 DIAGNOSIS — D509 Iron deficiency anemia, unspecified: Secondary | ICD-10-CM | POA: Diagnosis not present

## 2015-12-21 DIAGNOSIS — N186 End stage renal disease: Secondary | ICD-10-CM | POA: Diagnosis not present

## 2015-12-21 DIAGNOSIS — N2581 Secondary hyperparathyroidism of renal origin: Secondary | ICD-10-CM | POA: Diagnosis not present

## 2015-12-21 DIAGNOSIS — E1129 Type 2 diabetes mellitus with other diabetic kidney complication: Secondary | ICD-10-CM | POA: Diagnosis not present

## 2015-12-24 DIAGNOSIS — E1129 Type 2 diabetes mellitus with other diabetic kidney complication: Secondary | ICD-10-CM | POA: Diagnosis not present

## 2015-12-24 DIAGNOSIS — D509 Iron deficiency anemia, unspecified: Secondary | ICD-10-CM | POA: Diagnosis not present

## 2015-12-24 DIAGNOSIS — N186 End stage renal disease: Secondary | ICD-10-CM | POA: Diagnosis not present

## 2015-12-24 DIAGNOSIS — N2581 Secondary hyperparathyroidism of renal origin: Secondary | ICD-10-CM | POA: Diagnosis not present

## 2015-12-24 DIAGNOSIS — D631 Anemia in chronic kidney disease: Secondary | ICD-10-CM | POA: Diagnosis not present

## 2015-12-25 DIAGNOSIS — Z7901 Long term (current) use of anticoagulants: Secondary | ICD-10-CM | POA: Diagnosis not present

## 2015-12-26 DIAGNOSIS — N2581 Secondary hyperparathyroidism of renal origin: Secondary | ICD-10-CM | POA: Diagnosis not present

## 2015-12-26 DIAGNOSIS — N186 End stage renal disease: Secondary | ICD-10-CM | POA: Diagnosis not present

## 2015-12-26 DIAGNOSIS — E1129 Type 2 diabetes mellitus with other diabetic kidney complication: Secondary | ICD-10-CM | POA: Diagnosis not present

## 2015-12-26 DIAGNOSIS — D509 Iron deficiency anemia, unspecified: Secondary | ICD-10-CM | POA: Diagnosis not present

## 2015-12-26 DIAGNOSIS — D631 Anemia in chronic kidney disease: Secondary | ICD-10-CM | POA: Diagnosis not present

## 2015-12-28 DIAGNOSIS — N186 End stage renal disease: Secondary | ICD-10-CM | POA: Diagnosis not present

## 2015-12-28 DIAGNOSIS — N2581 Secondary hyperparathyroidism of renal origin: Secondary | ICD-10-CM | POA: Diagnosis not present

## 2015-12-28 DIAGNOSIS — E1129 Type 2 diabetes mellitus with other diabetic kidney complication: Secondary | ICD-10-CM | POA: Diagnosis not present

## 2015-12-28 DIAGNOSIS — D631 Anemia in chronic kidney disease: Secondary | ICD-10-CM | POA: Diagnosis not present

## 2015-12-28 DIAGNOSIS — D509 Iron deficiency anemia, unspecified: Secondary | ICD-10-CM | POA: Diagnosis not present

## 2015-12-31 DIAGNOSIS — D631 Anemia in chronic kidney disease: Secondary | ICD-10-CM | POA: Diagnosis not present

## 2015-12-31 DIAGNOSIS — N2581 Secondary hyperparathyroidism of renal origin: Secondary | ICD-10-CM | POA: Diagnosis not present

## 2015-12-31 DIAGNOSIS — N186 End stage renal disease: Secondary | ICD-10-CM | POA: Diagnosis not present

## 2015-12-31 DIAGNOSIS — D509 Iron deficiency anemia, unspecified: Secondary | ICD-10-CM | POA: Diagnosis not present

## 2015-12-31 DIAGNOSIS — E1129 Type 2 diabetes mellitus with other diabetic kidney complication: Secondary | ICD-10-CM | POA: Diagnosis not present

## 2016-01-02 DIAGNOSIS — D509 Iron deficiency anemia, unspecified: Secondary | ICD-10-CM | POA: Diagnosis not present

## 2016-01-02 DIAGNOSIS — E1129 Type 2 diabetes mellitus with other diabetic kidney complication: Secondary | ICD-10-CM | POA: Diagnosis not present

## 2016-01-02 DIAGNOSIS — D631 Anemia in chronic kidney disease: Secondary | ICD-10-CM | POA: Diagnosis not present

## 2016-01-02 DIAGNOSIS — N186 End stage renal disease: Secondary | ICD-10-CM | POA: Diagnosis not present

## 2016-01-02 DIAGNOSIS — N2581 Secondary hyperparathyroidism of renal origin: Secondary | ICD-10-CM | POA: Diagnosis not present

## 2016-01-03 DIAGNOSIS — M1711 Unilateral primary osteoarthritis, right knee: Secondary | ICD-10-CM | POA: Diagnosis not present

## 2016-01-03 DIAGNOSIS — Z7901 Long term (current) use of anticoagulants: Secondary | ICD-10-CM | POA: Diagnosis not present

## 2016-01-04 DIAGNOSIS — N2581 Secondary hyperparathyroidism of renal origin: Secondary | ICD-10-CM | POA: Diagnosis not present

## 2016-01-04 DIAGNOSIS — D509 Iron deficiency anemia, unspecified: Secondary | ICD-10-CM | POA: Diagnosis not present

## 2016-01-04 DIAGNOSIS — D631 Anemia in chronic kidney disease: Secondary | ICD-10-CM | POA: Diagnosis not present

## 2016-01-04 DIAGNOSIS — E1129 Type 2 diabetes mellitus with other diabetic kidney complication: Secondary | ICD-10-CM | POA: Diagnosis not present

## 2016-01-04 DIAGNOSIS — N186 End stage renal disease: Secondary | ICD-10-CM | POA: Diagnosis not present

## 2016-01-06 DIAGNOSIS — N186 End stage renal disease: Secondary | ICD-10-CM | POA: Diagnosis not present

## 2016-01-06 DIAGNOSIS — H409 Unspecified glaucoma: Secondary | ICD-10-CM | POA: Diagnosis not present

## 2016-01-06 DIAGNOSIS — I10 Essential (primary) hypertension: Secondary | ICD-10-CM | POA: Diagnosis not present

## 2016-01-06 DIAGNOSIS — E119 Type 2 diabetes mellitus without complications: Secondary | ICD-10-CM | POA: Diagnosis not present

## 2016-01-06 DIAGNOSIS — I69954 Hemiplegia and hemiparesis following unspecified cerebrovascular disease affecting left non-dominant side: Secondary | ICD-10-CM | POA: Diagnosis not present

## 2016-01-06 DIAGNOSIS — I509 Heart failure, unspecified: Secondary | ICD-10-CM | POA: Diagnosis not present

## 2016-01-06 DIAGNOSIS — M1990 Unspecified osteoarthritis, unspecified site: Secondary | ICD-10-CM | POA: Diagnosis not present

## 2016-01-06 DIAGNOSIS — M25569 Pain in unspecified knee: Secondary | ICD-10-CM | POA: Diagnosis not present

## 2016-01-07 DIAGNOSIS — N2581 Secondary hyperparathyroidism of renal origin: Secondary | ICD-10-CM | POA: Diagnosis not present

## 2016-01-07 DIAGNOSIS — N186 End stage renal disease: Secondary | ICD-10-CM | POA: Diagnosis not present

## 2016-01-07 DIAGNOSIS — D631 Anemia in chronic kidney disease: Secondary | ICD-10-CM | POA: Diagnosis not present

## 2016-01-07 DIAGNOSIS — D509 Iron deficiency anemia, unspecified: Secondary | ICD-10-CM | POA: Diagnosis not present

## 2016-01-07 DIAGNOSIS — E1129 Type 2 diabetes mellitus with other diabetic kidney complication: Secondary | ICD-10-CM | POA: Diagnosis not present

## 2016-01-09 DIAGNOSIS — M1711 Unilateral primary osteoarthritis, right knee: Secondary | ICD-10-CM | POA: Diagnosis not present

## 2016-01-09 DIAGNOSIS — M79641 Pain in right hand: Secondary | ICD-10-CM | POA: Diagnosis not present

## 2016-01-09 DIAGNOSIS — M65331 Trigger finger, right middle finger: Secondary | ICD-10-CM | POA: Diagnosis not present

## 2016-01-10 DIAGNOSIS — D509 Iron deficiency anemia, unspecified: Secondary | ICD-10-CM | POA: Diagnosis not present

## 2016-01-10 DIAGNOSIS — D631 Anemia in chronic kidney disease: Secondary | ICD-10-CM | POA: Diagnosis not present

## 2016-01-10 DIAGNOSIS — E1129 Type 2 diabetes mellitus with other diabetic kidney complication: Secondary | ICD-10-CM | POA: Diagnosis not present

## 2016-01-10 DIAGNOSIS — N186 End stage renal disease: Secondary | ICD-10-CM | POA: Diagnosis not present

## 2016-01-10 DIAGNOSIS — Z7901 Long term (current) use of anticoagulants: Secondary | ICD-10-CM | POA: Diagnosis not present

## 2016-01-10 DIAGNOSIS — N2581 Secondary hyperparathyroidism of renal origin: Secondary | ICD-10-CM | POA: Diagnosis not present

## 2016-01-11 DIAGNOSIS — D631 Anemia in chronic kidney disease: Secondary | ICD-10-CM | POA: Diagnosis not present

## 2016-01-11 DIAGNOSIS — D509 Iron deficiency anemia, unspecified: Secondary | ICD-10-CM | POA: Diagnosis not present

## 2016-01-11 DIAGNOSIS — E1129 Type 2 diabetes mellitus with other diabetic kidney complication: Secondary | ICD-10-CM | POA: Diagnosis not present

## 2016-01-11 DIAGNOSIS — N186 End stage renal disease: Secondary | ICD-10-CM | POA: Diagnosis not present

## 2016-01-11 DIAGNOSIS — N2581 Secondary hyperparathyroidism of renal origin: Secondary | ICD-10-CM | POA: Diagnosis not present

## 2016-01-14 DIAGNOSIS — E1129 Type 2 diabetes mellitus with other diabetic kidney complication: Secondary | ICD-10-CM | POA: Diagnosis not present

## 2016-01-14 DIAGNOSIS — N186 End stage renal disease: Secondary | ICD-10-CM | POA: Diagnosis not present

## 2016-01-14 DIAGNOSIS — D509 Iron deficiency anemia, unspecified: Secondary | ICD-10-CM | POA: Diagnosis not present

## 2016-01-14 DIAGNOSIS — D631 Anemia in chronic kidney disease: Secondary | ICD-10-CM | POA: Diagnosis not present

## 2016-01-14 DIAGNOSIS — N2581 Secondary hyperparathyroidism of renal origin: Secondary | ICD-10-CM | POA: Diagnosis not present

## 2016-01-14 DIAGNOSIS — Z992 Dependence on renal dialysis: Secondary | ICD-10-CM | POA: Diagnosis not present

## 2016-01-15 DIAGNOSIS — L600 Ingrowing nail: Secondary | ICD-10-CM | POA: Diagnosis not present

## 2016-01-15 DIAGNOSIS — M79673 Pain in unspecified foot: Secondary | ICD-10-CM | POA: Diagnosis not present

## 2016-01-15 DIAGNOSIS — Z7901 Long term (current) use of anticoagulants: Secondary | ICD-10-CM | POA: Diagnosis not present

## 2016-01-15 DIAGNOSIS — B351 Tinea unguium: Secondary | ICD-10-CM | POA: Diagnosis not present

## 2016-01-15 DIAGNOSIS — R262 Difficulty in walking, not elsewhere classified: Secondary | ICD-10-CM | POA: Diagnosis not present

## 2016-01-16 DIAGNOSIS — E1129 Type 2 diabetes mellitus with other diabetic kidney complication: Secondary | ICD-10-CM | POA: Diagnosis not present

## 2016-01-16 DIAGNOSIS — D509 Iron deficiency anemia, unspecified: Secondary | ICD-10-CM | POA: Diagnosis not present

## 2016-01-16 DIAGNOSIS — N186 End stage renal disease: Secondary | ICD-10-CM | POA: Diagnosis not present

## 2016-01-16 DIAGNOSIS — N2581 Secondary hyperparathyroidism of renal origin: Secondary | ICD-10-CM | POA: Diagnosis not present

## 2016-01-16 DIAGNOSIS — D500 Iron deficiency anemia secondary to blood loss (chronic): Secondary | ICD-10-CM | POA: Diagnosis not present

## 2016-01-18 DIAGNOSIS — D500 Iron deficiency anemia secondary to blood loss (chronic): Secondary | ICD-10-CM | POA: Diagnosis not present

## 2016-01-18 DIAGNOSIS — E1129 Type 2 diabetes mellitus with other diabetic kidney complication: Secondary | ICD-10-CM | POA: Diagnosis not present

## 2016-01-18 DIAGNOSIS — N2581 Secondary hyperparathyroidism of renal origin: Secondary | ICD-10-CM | POA: Diagnosis not present

## 2016-01-18 DIAGNOSIS — N186 End stage renal disease: Secondary | ICD-10-CM | POA: Diagnosis not present

## 2016-01-18 DIAGNOSIS — D509 Iron deficiency anemia, unspecified: Secondary | ICD-10-CM | POA: Diagnosis not present

## 2016-01-21 DIAGNOSIS — D500 Iron deficiency anemia secondary to blood loss (chronic): Secondary | ICD-10-CM | POA: Diagnosis not present

## 2016-01-21 DIAGNOSIS — N2581 Secondary hyperparathyroidism of renal origin: Secondary | ICD-10-CM | POA: Diagnosis not present

## 2016-01-21 DIAGNOSIS — D509 Iron deficiency anemia, unspecified: Secondary | ICD-10-CM | POA: Diagnosis not present

## 2016-01-21 DIAGNOSIS — E1129 Type 2 diabetes mellitus with other diabetic kidney complication: Secondary | ICD-10-CM | POA: Diagnosis not present

## 2016-01-21 DIAGNOSIS — N186 End stage renal disease: Secondary | ICD-10-CM | POA: Diagnosis not present

## 2016-01-22 DIAGNOSIS — Z7901 Long term (current) use of anticoagulants: Secondary | ICD-10-CM | POA: Diagnosis not present

## 2016-01-23 DIAGNOSIS — E1129 Type 2 diabetes mellitus with other diabetic kidney complication: Secondary | ICD-10-CM | POA: Diagnosis not present

## 2016-01-23 DIAGNOSIS — N2581 Secondary hyperparathyroidism of renal origin: Secondary | ICD-10-CM | POA: Diagnosis not present

## 2016-01-23 DIAGNOSIS — D509 Iron deficiency anemia, unspecified: Secondary | ICD-10-CM | POA: Diagnosis not present

## 2016-01-23 DIAGNOSIS — N186 End stage renal disease: Secondary | ICD-10-CM | POA: Diagnosis not present

## 2016-01-23 DIAGNOSIS — D500 Iron deficiency anemia secondary to blood loss (chronic): Secondary | ICD-10-CM | POA: Diagnosis not present

## 2016-01-24 DIAGNOSIS — Z86010 Personal history of colonic polyps: Secondary | ICD-10-CM | POA: Diagnosis not present

## 2016-01-24 DIAGNOSIS — I824Z9 Acute embolism and thrombosis of unspecified deep veins of unspecified distal lower extremity: Secondary | ICD-10-CM | POA: Diagnosis not present

## 2016-01-24 DIAGNOSIS — Z7901 Long term (current) use of anticoagulants: Secondary | ICD-10-CM | POA: Diagnosis not present

## 2016-01-25 DIAGNOSIS — D500 Iron deficiency anemia secondary to blood loss (chronic): Secondary | ICD-10-CM | POA: Diagnosis not present

## 2016-01-25 DIAGNOSIS — N2581 Secondary hyperparathyroidism of renal origin: Secondary | ICD-10-CM | POA: Diagnosis not present

## 2016-01-25 DIAGNOSIS — D509 Iron deficiency anemia, unspecified: Secondary | ICD-10-CM | POA: Diagnosis not present

## 2016-01-25 DIAGNOSIS — E1129 Type 2 diabetes mellitus with other diabetic kidney complication: Secondary | ICD-10-CM | POA: Diagnosis not present

## 2016-01-25 DIAGNOSIS — N186 End stage renal disease: Secondary | ICD-10-CM | POA: Diagnosis not present

## 2016-01-27 DIAGNOSIS — I509 Heart failure, unspecified: Secondary | ICD-10-CM | POA: Diagnosis not present

## 2016-01-27 DIAGNOSIS — I10 Essential (primary) hypertension: Secondary | ICD-10-CM | POA: Diagnosis not present

## 2016-01-27 DIAGNOSIS — H409 Unspecified glaucoma: Secondary | ICD-10-CM | POA: Diagnosis not present

## 2016-01-27 DIAGNOSIS — N186 End stage renal disease: Secondary | ICD-10-CM | POA: Diagnosis not present

## 2016-01-27 DIAGNOSIS — I69954 Hemiplegia and hemiparesis following unspecified cerebrovascular disease affecting left non-dominant side: Secondary | ICD-10-CM | POA: Diagnosis not present

## 2016-01-27 DIAGNOSIS — G2581 Restless legs syndrome: Secondary | ICD-10-CM | POA: Diagnosis not present

## 2016-01-27 DIAGNOSIS — M25569 Pain in unspecified knee: Secondary | ICD-10-CM | POA: Diagnosis not present

## 2016-01-27 DIAGNOSIS — M1990 Unspecified osteoarthritis, unspecified site: Secondary | ICD-10-CM | POA: Diagnosis not present

## 2016-01-28 DIAGNOSIS — E1129 Type 2 diabetes mellitus with other diabetic kidney complication: Secondary | ICD-10-CM | POA: Diagnosis not present

## 2016-01-28 DIAGNOSIS — D509 Iron deficiency anemia, unspecified: Secondary | ICD-10-CM | POA: Diagnosis not present

## 2016-01-28 DIAGNOSIS — D500 Iron deficiency anemia secondary to blood loss (chronic): Secondary | ICD-10-CM | POA: Diagnosis not present

## 2016-01-28 DIAGNOSIS — N186 End stage renal disease: Secondary | ICD-10-CM | POA: Diagnosis not present

## 2016-01-28 DIAGNOSIS — N2581 Secondary hyperparathyroidism of renal origin: Secondary | ICD-10-CM | POA: Diagnosis not present

## 2016-01-29 DIAGNOSIS — Z79899 Other long term (current) drug therapy: Secondary | ICD-10-CM | POA: Diagnosis not present

## 2016-01-29 DIAGNOSIS — Z7901 Long term (current) use of anticoagulants: Secondary | ICD-10-CM | POA: Diagnosis not present

## 2016-01-29 DIAGNOSIS — E785 Hyperlipidemia, unspecified: Secondary | ICD-10-CM | POA: Diagnosis not present

## 2016-01-29 DIAGNOSIS — E119 Type 2 diabetes mellitus without complications: Secondary | ICD-10-CM | POA: Diagnosis not present

## 2016-01-30 DIAGNOSIS — D500 Iron deficiency anemia secondary to blood loss (chronic): Secondary | ICD-10-CM | POA: Diagnosis not present

## 2016-01-30 DIAGNOSIS — E1129 Type 2 diabetes mellitus with other diabetic kidney complication: Secondary | ICD-10-CM | POA: Diagnosis not present

## 2016-01-30 DIAGNOSIS — D509 Iron deficiency anemia, unspecified: Secondary | ICD-10-CM | POA: Diagnosis not present

## 2016-01-30 DIAGNOSIS — N2581 Secondary hyperparathyroidism of renal origin: Secondary | ICD-10-CM | POA: Diagnosis not present

## 2016-01-30 DIAGNOSIS — N186 End stage renal disease: Secondary | ICD-10-CM | POA: Diagnosis not present

## 2016-02-01 DIAGNOSIS — D500 Iron deficiency anemia secondary to blood loss (chronic): Secondary | ICD-10-CM | POA: Diagnosis not present

## 2016-02-01 DIAGNOSIS — N2581 Secondary hyperparathyroidism of renal origin: Secondary | ICD-10-CM | POA: Diagnosis not present

## 2016-02-01 DIAGNOSIS — N186 End stage renal disease: Secondary | ICD-10-CM | POA: Diagnosis not present

## 2016-02-01 DIAGNOSIS — E1129 Type 2 diabetes mellitus with other diabetic kidney complication: Secondary | ICD-10-CM | POA: Diagnosis not present

## 2016-02-01 DIAGNOSIS — D509 Iron deficiency anemia, unspecified: Secondary | ICD-10-CM | POA: Diagnosis not present

## 2016-02-03 DIAGNOSIS — D509 Iron deficiency anemia, unspecified: Secondary | ICD-10-CM | POA: Diagnosis not present

## 2016-02-03 DIAGNOSIS — N186 End stage renal disease: Secondary | ICD-10-CM | POA: Diagnosis not present

## 2016-02-03 DIAGNOSIS — D500 Iron deficiency anemia secondary to blood loss (chronic): Secondary | ICD-10-CM | POA: Diagnosis not present

## 2016-02-03 DIAGNOSIS — E1129 Type 2 diabetes mellitus with other diabetic kidney complication: Secondary | ICD-10-CM | POA: Diagnosis not present

## 2016-02-03 DIAGNOSIS — N2581 Secondary hyperparathyroidism of renal origin: Secondary | ICD-10-CM | POA: Diagnosis not present

## 2016-02-05 DIAGNOSIS — D509 Iron deficiency anemia, unspecified: Secondary | ICD-10-CM | POA: Diagnosis not present

## 2016-02-05 DIAGNOSIS — N2581 Secondary hyperparathyroidism of renal origin: Secondary | ICD-10-CM | POA: Diagnosis not present

## 2016-02-05 DIAGNOSIS — D500 Iron deficiency anemia secondary to blood loss (chronic): Secondary | ICD-10-CM | POA: Diagnosis not present

## 2016-02-05 DIAGNOSIS — N186 End stage renal disease: Secondary | ICD-10-CM | POA: Diagnosis not present

## 2016-02-05 DIAGNOSIS — E1129 Type 2 diabetes mellitus with other diabetic kidney complication: Secondary | ICD-10-CM | POA: Diagnosis not present

## 2016-02-08 ENCOUNTER — Encounter (HOSPITAL_COMMUNITY): Payer: Self-pay | Admitting: Emergency Medicine

## 2016-02-08 ENCOUNTER — Inpatient Hospital Stay (HOSPITAL_COMMUNITY)
Admission: EM | Admit: 2016-02-08 | Discharge: 2016-02-10 | DRG: 314 | Disposition: A | Discharge status: Nursing Home (Includes ICF) | Payer: Medicare Other | Attending: Internal Medicine | Admitting: Internal Medicine

## 2016-02-08 DIAGNOSIS — I132 Hypertensive heart and chronic kidney disease with heart failure and with stage 5 chronic kidney disease, or end stage renal disease: Secondary | ICD-10-CM | POA: Diagnosis present

## 2016-02-08 DIAGNOSIS — I10 Essential (primary) hypertension: Secondary | ICD-10-CM | POA: Diagnosis not present

## 2016-02-08 DIAGNOSIS — Z87891 Personal history of nicotine dependence: Secondary | ICD-10-CM

## 2016-02-08 DIAGNOSIS — Z8249 Family history of ischemic heart disease and other diseases of the circulatory system: Secondary | ICD-10-CM

## 2016-02-08 DIAGNOSIS — T82838A Hemorrhage of vascular prosthetic devices, implants and grafts, initial encounter: Principal | ICD-10-CM | POA: Diagnosis present

## 2016-02-08 DIAGNOSIS — I82409 Acute embolism and thrombosis of unspecified deep veins of unspecified lower extremity: Secondary | ICD-10-CM | POA: Diagnosis present

## 2016-02-08 DIAGNOSIS — Z66 Do not resuscitate: Secondary | ICD-10-CM | POA: Diagnosis present

## 2016-02-08 DIAGNOSIS — I2510 Atherosclerotic heart disease of native coronary artery without angina pectoris: Secondary | ICD-10-CM | POA: Diagnosis present

## 2016-02-08 DIAGNOSIS — I69322 Dysarthria following cerebral infarction: Secondary | ICD-10-CM

## 2016-02-08 DIAGNOSIS — N186 End stage renal disease: Secondary | ICD-10-CM | POA: Diagnosis present

## 2016-02-08 DIAGNOSIS — R578 Other shock: Secondary | ICD-10-CM | POA: Diagnosis present

## 2016-02-08 DIAGNOSIS — D509 Iron deficiency anemia, unspecified: Secondary | ICD-10-CM | POA: Diagnosis not present

## 2016-02-08 DIAGNOSIS — E1122 Type 2 diabetes mellitus with diabetic chronic kidney disease: Secondary | ICD-10-CM | POA: Diagnosis present

## 2016-02-08 DIAGNOSIS — Z79899 Other long term (current) drug therapy: Secondary | ICD-10-CM

## 2016-02-08 DIAGNOSIS — Z992 Dependence on renal dialysis: Secondary | ICD-10-CM

## 2016-02-08 DIAGNOSIS — T82898A Other specified complication of vascular prosthetic devices, implants and grafts, initial encounter: Secondary | ICD-10-CM | POA: Diagnosis not present

## 2016-02-08 DIAGNOSIS — N2581 Secondary hyperparathyroidism of renal origin: Secondary | ICD-10-CM | POA: Diagnosis not present

## 2016-02-08 DIAGNOSIS — I69354 Hemiplegia and hemiparesis following cerebral infarction affecting left non-dominant side: Secondary | ICD-10-CM

## 2016-02-08 DIAGNOSIS — Z7901 Long term (current) use of anticoagulants: Secondary | ICD-10-CM

## 2016-02-08 DIAGNOSIS — I9589 Other hypotension: Secondary | ICD-10-CM | POA: Diagnosis not present

## 2016-02-08 DIAGNOSIS — E1129 Type 2 diabetes mellitus with other diabetic kidney complication: Secondary | ICD-10-CM | POA: Diagnosis not present

## 2016-02-08 DIAGNOSIS — R58 Hemorrhage, not elsewhere classified: Secondary | ICD-10-CM | POA: Diagnosis present

## 2016-02-08 DIAGNOSIS — Z833 Family history of diabetes mellitus: Secondary | ICD-10-CM

## 2016-02-08 DIAGNOSIS — E785 Hyperlipidemia, unspecified: Secondary | ICD-10-CM | POA: Diagnosis present

## 2016-02-08 DIAGNOSIS — E1140 Type 2 diabetes mellitus with diabetic neuropathy, unspecified: Secondary | ICD-10-CM | POA: Diagnosis present

## 2016-02-08 DIAGNOSIS — T45515A Adverse effect of anticoagulants, initial encounter: Secondary | ICD-10-CM | POA: Diagnosis present

## 2016-02-08 DIAGNOSIS — Z794 Long term (current) use of insulin: Secondary | ICD-10-CM

## 2016-02-08 DIAGNOSIS — D62 Acute posthemorrhagic anemia: Secondary | ICD-10-CM

## 2016-02-08 DIAGNOSIS — D689 Coagulation defect, unspecified: Secondary | ICD-10-CM | POA: Diagnosis present

## 2016-02-08 DIAGNOSIS — I5042 Chronic combined systolic (congestive) and diastolic (congestive) heart failure: Secondary | ICD-10-CM

## 2016-02-08 DIAGNOSIS — D500 Iron deficiency anemia secondary to blood loss (chronic): Secondary | ICD-10-CM | POA: Diagnosis not present

## 2016-02-08 DIAGNOSIS — D649 Anemia, unspecified: Secondary | ICD-10-CM | POA: Diagnosis present

## 2016-02-08 DIAGNOSIS — Y841 Kidney dialysis as the cause of abnormal reaction of the patient, or of later complication, without mention of misadventure at the time of the procedure: Secondary | ICD-10-CM | POA: Diagnosis present

## 2016-02-08 DIAGNOSIS — Z86718 Personal history of other venous thrombosis and embolism: Secondary | ICD-10-CM

## 2016-02-08 LAB — CBC WITH DIFFERENTIAL/PLATELET
Basophils Absolute: 0 K/uL (ref 0.0–0.1)
Basophils Relative: 0 %
Eosinophils Absolute: 0.1 K/uL (ref 0.0–0.7)
Eosinophils Relative: 2 %
HCT: 34.5 % — ABNORMAL LOW (ref 36.0–46.0)
Hemoglobin: 11.2 g/dL — ABNORMAL LOW (ref 12.0–15.0)
Lymphocytes Relative: 33 %
Lymphs Abs: 1.1 K/uL (ref 0.7–4.0)
MCH: 31.1 pg (ref 26.0–34.0)
MCHC: 32.5 g/dL (ref 30.0–36.0)
MCV: 95.8 fL (ref 78.0–100.0)
Monocytes Absolute: 0.3 K/uL (ref 0.1–1.0)
Monocytes Relative: 8 %
Neutro Abs: 1.9 K/uL (ref 1.7–7.7)
Neutrophils Relative %: 57 %
Platelets: 122 K/uL — ABNORMAL LOW (ref 150–400)
RBC: 3.6 MIL/uL — ABNORMAL LOW (ref 3.87–5.11)
RDW: 15.8 % — ABNORMAL HIGH (ref 11.5–15.5)
WBC: 3.3 K/uL — ABNORMAL LOW (ref 4.0–10.5)

## 2016-02-08 LAB — BASIC METABOLIC PANEL
Anion gap: 13 (ref 5–15)
BUN: 14 mg/dL (ref 6–20)
CALCIUM: 8.2 mg/dL — AB (ref 8.9–10.3)
CO2: 29 mmol/L (ref 22–32)
CREATININE: 3.49 mg/dL — AB (ref 0.44–1.00)
Chloride: 95 mmol/L — ABNORMAL LOW (ref 101–111)
GFR calc Af Amer: 14 mL/min — ABNORMAL LOW (ref >60)
GFR, EST NON AFRICAN AMERICAN: 12 mL/min — AB (ref >60)
Glucose, Bld: 105 mg/dL — ABNORMAL HIGH (ref 65–99)
Potassium: 4.4 mmol/L (ref 3.5–5.1)
SODIUM: 137 mmol/L (ref 135–145)

## 2016-02-08 LAB — CBC
HCT: 29.1 % — ABNORMAL LOW (ref 36.0–46.0)
Hemoglobin: 9.3 g/dL — ABNORMAL LOW (ref 12.0–15.0)
MCH: 30.7 pg (ref 26.0–34.0)
MCHC: 32 g/dL (ref 30.0–36.0)
MCV: 96 fL (ref 78.0–100.0)
Platelets: 122 K/uL — ABNORMAL LOW (ref 150–400)
RBC: 3.03 MIL/uL — ABNORMAL LOW (ref 3.87–5.11)
RDW: 15.9 % — ABNORMAL HIGH (ref 11.5–15.5)
WBC: 3.8 K/uL — ABNORMAL LOW (ref 4.0–10.5)

## 2016-02-08 LAB — PROTIME-INR
INR: 3.8
Prothrombin Time: 38.4 s — ABNORMAL HIGH (ref 11.4–15.2)

## 2016-02-08 LAB — PREPARE RBC (CROSSMATCH)

## 2016-02-08 MED ORDER — INSULIN ASPART 100 UNIT/ML ~~LOC~~ SOLN
0.0000 [IU] | Freq: Three times a day (TID) | SUBCUTANEOUS | Status: DC
Start: 2016-02-09 — End: 2016-02-10
  Administered 2016-02-09: 1 [IU] via SUBCUTANEOUS
  Administered 2016-02-10: 2 [IU] via SUBCUTANEOUS

## 2016-02-08 MED ORDER — TRANEXAMIC ACID 1000 MG/10ML IV SOLN
500.0000 mg | Freq: Once | INTRAVENOUS | Status: AC
  Administered 2016-02-08: 500 mg via TOPICAL
  Filled 2016-02-08: qty 10

## 2016-02-08 MED ORDER — INSULIN GLARGINE 100 UNIT/ML ~~LOC~~ SOLN
10.0000 [IU] | Freq: Every day | SUBCUTANEOUS | Status: DC
  Administered 2016-02-09: 10 [IU] via SUBCUTANEOUS
  Filled 2016-02-08 (×2): qty 0.1

## 2016-02-08 MED ORDER — LATANOPROST 0.005 % OP SOLN
1.0000 [drp] | Freq: Every day | OPHTHALMIC | Status: DC
  Administered 2016-02-09 (×2): 1 [drp] via OPHTHALMIC
  Filled 2016-02-08: qty 2.5

## 2016-02-08 MED ORDER — LISINOPRIL 20 MG PO TABS
30.0000 mg | ORAL_TABLET | Freq: Every day | ORAL | Status: DC
  Filled 2016-02-08: qty 4

## 2016-02-08 MED ORDER — ONDANSETRON HCL 4 MG PO TABS: 4.0000 mg | ORAL_TABLET | Freq: Four times a day (QID) | ORAL | Status: DC | PRN

## 2016-02-08 MED ORDER — SODIUM CHLORIDE 0.9% FLUSH: 3.0000 mL | INTRAVENOUS | Status: DC | PRN

## 2016-02-08 MED ORDER — POLYETHYLENE GLYCOL 3350 17 G PO PACK: 17.0000 g | PACK | Freq: Every day | ORAL | Status: DC | PRN

## 2016-02-08 MED ORDER — VITAMIN K1 10 MG/ML IJ SOLN
10.0000 mg | Freq: Once | INTRAMUSCULAR | Status: AC
  Administered 2016-02-08: 10 mg via INTRAVENOUS
  Filled 2016-02-08: qty 1

## 2016-02-08 MED ORDER — ONDANSETRON HCL 4 MG/2ML IJ SOLN: 4.0000 mg | Freq: Four times a day (QID) | INTRAMUSCULAR | Status: DC | PRN

## 2016-02-08 MED ORDER — SEVELAMER CARBONATE 2.4 G PO PACK
2.4000 g | PACK | Freq: Three times a day (TID) | ORAL | Status: DC
  Administered 2016-02-09 – 2016-02-10 (×3): 2.4 g via ORAL
  Filled 2016-02-08 (×6): qty 1

## 2016-02-08 MED ORDER — ACETAMINOPHEN 650 MG RE SUPP: 650.0000 mg | Freq: Four times a day (QID) | RECTAL | Status: DC | PRN

## 2016-02-08 MED ORDER — ROPINIROLE HCL 1 MG PO TABS
0.5000 mg | ORAL_TABLET | Freq: Every day | ORAL | Status: DC
  Administered 2016-02-09 (×2): 0.5 mg via ORAL
  Filled 2016-02-08 (×2): qty 1

## 2016-02-08 MED ORDER — SODIUM CHLORIDE 0.9 % IV SOLN
Freq: Once | INTRAVENOUS | Status: AC
  Administered 2016-02-09: 01:00:00 via INTRAVENOUS

## 2016-02-08 MED ORDER — SODIUM CHLORIDE 0.9% FLUSH
3.0000 mL | Freq: Two times a day (BID) | INTRAVENOUS | Status: DC
Start: 2016-02-08 — End: 2016-02-10
  Administered 2016-02-09 – 2016-02-10 (×4): 3 mL via INTRAVENOUS

## 2016-02-08 MED ORDER — SODIUM CHLORIDE 0.9 % IV SOLN: 250.0000 mL | INTRAVENOUS | Status: DC | PRN

## 2016-02-08 MED ORDER — CINACALCET HCL 30 MG PO TABS
60.0000 mg | ORAL_TABLET | Freq: Every day | ORAL | Status: DC
  Administered 2016-02-09 – 2016-02-10 (×2): 60 mg via ORAL
  Filled 2016-02-08 (×2): qty 2

## 2016-02-08 MED ORDER — LORATADINE 10 MG PO TABS
10.0000 mg | ORAL_TABLET | Freq: Every day | ORAL | Status: DC
  Administered 2016-02-09 – 2016-02-10 (×2): 10 mg via ORAL
  Filled 2016-02-08 (×2): qty 1

## 2016-02-08 MED ORDER — ACETAMINOPHEN 325 MG PO TABS: 650.0000 mg | ORAL_TABLET | Freq: Four times a day (QID) | ORAL | Status: DC | PRN

## 2016-02-08 MED ORDER — PRAVASTATIN SODIUM 40 MG PO TABS
40.0000 mg | ORAL_TABLET | Freq: Every day | ORAL | Status: DC
  Administered 2016-02-09 – 2016-02-10 (×2): 40 mg via ORAL
  Filled 2016-02-08 (×2): qty 1

## 2016-02-08 MED ORDER — TRAMADOL HCL 50 MG PO TABS
50.0000 mg | ORAL_TABLET | Freq: Two times a day (BID) | ORAL | Status: DC
  Administered 2016-02-09 – 2016-02-10 (×4): 50 mg via ORAL
  Filled 2016-02-08 (×4): qty 1

## 2016-02-08 MED ORDER — SEVELAMER CARBONATE 2.4 G PO PACK: 2.4000 g | PACK | ORAL | Status: DC | PRN

## 2016-02-08 NOTE — ED Notes (Signed)
This RN attempted x2 for IV access without success. 

## 2016-02-08 NOTE — ED Provider Notes (Signed)
[facility] DEPT Provider Note   CSN: [id number] Arrival date & time: 02/08/16  1727     History   Chief Complaint Chief Complaint  Patient presents with  . Other    graft bleeding    HPI Stefanie [name] is a 70 y.o. female.  HPI 71 year old female with extensive past medical history listed below including chronic anticoagulation on Coumadin, ESRD on dialysis with a left femoral AV fistula presents from dialysis center for uncontrolled bleeding following dialysis Center. Patient has been seen here for the same previously requiring prolonged compression. HD staff has been holding pressure and placed quick clot since 1430 without improvement. Currently denies any other physical complaints.   Past Medical History:  Diagnosis Date  . Anemia   . CAD (coronary artery disease)   . CHF (congestive heart failure) ([place])   . Constipation   . CVA (cerebral vascular accident) ([place]) [place]   left sided weakness  . Diabetes mellitus   . End stage renal disease ([place])    initiated HD 2006  . Hx of cardiovascular stress test    a. MV 03/2010:  no ischemia, EF 55%  . Hx of echocardiogram    a. Echo 03/2010: EF 55-60%, Gr 1 diast dysfn, trivial MS, mild to mod LAE, PASP 34, ;  b. Echo 11/13: mild LVH, EF 60%, Gr 1 diast dysfn, Ao sclerosis, no AS, MAC, slight MS, mean 3 mmHg, PASP 34  . Hyperlipidemia   . Hypertension   . Protein malnutrition ([place])   . Renal insufficiency   . Retinal detachment, bilateral   . Secondary hyperparathyroidism [facility])     Patient Active Problem List   Diagnosis Date Noted  . Hemorrhage 02/08/2016  . Anemia 02/08/2016  . Left leg pain 04/10/2015  . Bacteremia 04/10/2015  . Lactic acidosis   . Cellulitis 04/09/2015  . History of CVA with residual deficit 02/11/2015  . Debility 02/08/2015  . DVT (deep venous thrombosis) ([place])   . CHF (congestive heart failure) ([place])   . Chronic diastolic congestive heart failure ([place])   . ESRD (end stage renal  disease) ([place])   . Congestive dilated cardiomyopathy ([place])   . Left leg DVT ([place]) 02/01/2015  . Leukocytosis 02/01/2015  . Elevated troponin 02/01/2015  . SOB (shortness of breath) 02/01/2015  . Type 2 diabetes, controlled, with neuropathy ([place]) 12/20/2014  . Pain due to onychomycosis of toenail 12/20/2014  . 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 Mechanical complication of other vascular device, implant, and graft 05/19/2013  . PAD (peripheral artery disease) ([place]) 02/24/2012  . Murmur 12/24/2011  . Bradycardia-intermittent sinus 07/10/2010  . DYSPNEA ON EXERTION 02/25/2010  . CHEST PAIN, EXERTIONAL 02/25/2010  . DM 02/24/2010  . PROTEIN MALNUTRITION 02/24/2010  . HLD (hyperlipidemia) 02/24/2010  . ANEMIA 02/24/2010  . Essential hypertension 02/24/2010  . Cerebral artery occlusion with cerebral infarction ([place]) 02/24/2010  . ESRD on dialysis ([place]) 02/24/2010  . Secondary renal hyperparathyroidism ([place]) 02/24/2010    Past Surgical History:  Procedure Laterality Date  . ARTERIOVENOUS GRAFT PLACEMENT     BUE numerous times  . ARTERIOVENOUS GRAFT PLACEMENT  11/13/10   Lt femoral - Dr. Kellie Simmering  . CARDIAC CATHETERIZATION N/A 02/05/2015   Procedure: Left Heart Cath and Coronary Angiography;  Surgeon: Jettie Booze, MD;  Location: [place] CV LAB;  Service: Cardiovascular;  Laterality: N/A;  . CATARACT EXTRACTION    . DG AV DIALYSIS GRAFT DECLOT OR  06/06/11, 08/26/11   performed in  IR  . PERIPHERAL VASCULAR CATHETERIZATION N/A 02/06/2015   Procedure: A/V Shuntogram;  Surgeon: Elam Dutch, MD;  Location: [facility] CV LAB;  Service: Cardiovascular;  Laterality: N/A;  . SHUNTOGRAM N/A 05/29/2013   Procedure: Earney Mallet;  Surgeon: Conrad Ronda, MD;  Location: [facility] CATH LAB;  Service: Cardiovascular;  Laterality: N/A;  . SHUNTOGRAM Left 03/01/2014   Procedure: Earney Mallet;  Surgeon: Conrad Magnolia, MD;  Location: [facility] CATH LAB;  Service: Cardiovascular;  Laterality: Left;  . TUBAL  LIGATION      OB History    No data available       Home Medications    Prior to Admission medications   Medication Sig Start Date End Date Taking? Authorizing Provider  acetaminophen (MAPAP) 325 MG tablet Take 650 mg by mouth 2 (two) times daily.   Yes Historical Provider, MD  cinacalcet (SENSIPAR) 30 MG tablet Take 60 mg by mouth daily.   Yes Historical Provider, MD  diclofenac sodium (VOLTAREN) 1 % GEL Apply 2 g topically See admin instructions. Apply to both knees daily as needed for pain and to right hand 2-3 times a day as needed for pain (BRAND NAME VOLTAREN)   Yes Historical Provider, MD  Insulin Degludec (TRESIBA FLEXTOUCH) 100 UNIT/ML SOPN Inject 8 Units into the skin at bedtime. CHECK BGL PRIOR TO DOSING   Yes Historical Provider, MD  latanoprost (XALATAN) 0.005 % ophthalmic solution Place 1 drop into the left eye at bedtime.   Yes Historical Provider, MD  lidocaine-prilocaine (EMLA) cream Apply 1 application topically as needed (topical anesthesia for hemodialysis if Gebauers and Lidocaine injection are ineffective.). Patient taking differently: Apply 1 application topically 3 (three) times a week.  02/25/15  Yes Ivan Anchors Love, PA-C  lisinopril (PRINIVIL,ZESTRIL) 30 MG tablet Take 30 mg by mouth every morning.    Yes Historical Provider, MD  loratadine (CLARITIN) 10 MG tablet Take 10 mg by mouth daily.   Yes Historical Provider, MD  ofloxacin (OCUFLOX) 0.3 % ophthalmic solution Place 1 drop into the right eye 4 (four) times daily.   Yes Historical Provider, MD  oxyCODONE (OXY IR/ROXICODONE) 5 MG immediate release tablet Take 5 mg by mouth 2 (two) times daily.   Yes Historical Provider, MD  polyethylene glycol (MIRALAX) packet Use 17 gms three times daily until bowels move, with maximum of 3 consecutive days Patient taking differently: See admin instructions. Mix 17 grams with 8 ounces of liquid and drink once a day as needed for constipation on Tues/Thurs/Sat 09/02/15  Yes Shari  Upstill, PA-C  pravastatin (PRAVACHOL) 20 MG tablet Take 20 mg by mouth at bedtime.   Yes Historical Provider, MD  prednisoLONE acetate (PRED FORTE) 1 % ophthalmic suspension Place 1 drop into the right eye 4 (four) times daily.   Yes Historical Provider, MD  rOPINIRole (REQUIP) 0.5 MG tablet Take 0.5 mg by mouth at bedtime. 07/27/14  Yes Historical Provider, MD  senna-docusate (SENOKOT-S) 8.6-50 MG tablet Take 2 tablets by mouth daily as needed for mild constipation.   Yes Historical Provider, MD  sorbitol 70 % solution Take 30 mLs by mouth daily as needed (for constipation).   Yes Historical Provider, MD  warfarin (COUMADIN) 7.5 MG tablet Take 7.5 mg by mouth every evening.    Yes Historical Provider, MD  glycerin adult (GLYCERIN ADULT) 2 g SUPP Place 1 suppository rectally once. Patient not taking: Reported on 02/08/2016 09/04/15   Carlisle Cater, PA-C  insulin glargine (LANTUS) 100 UNIT/ML injection Inject 0.1  mLs (10 Units total) into the skin at bedtime. Patient not taking: Reported on 02/08/2016 02/25/15   Ivan Anchors Love, PA-C  nitroGLYCERIN (NITROSTAT) 0.4 MG SL tablet Place 1 tablet (0.4 mg total) under the tongue every 5 (five) minutes as needed for chest pain. Patient not taking: Reported on 02/08/2016 02/25/15   Ivan Anchors Love, PA-C  sevelamer carbonate (RENVELA) 2.4 g PACK Take 2.4 g by mouth 3 (three) times daily with meals. And 1 packet with each snack    Historical Provider, MD  traMADol (ULTRAM) 50 MG tablet Take 50 mg by mouth 2 (two) times daily.    Historical Provider, MD    Family History Family History  Problem Relation Age of Onset  . Heart disease Mother   . Diabetes Mother   . Hypertension Mother   . Heart attack Mother   . Diabetes Other   . Kidney disease Other   . Cardiomyopathy    . Diabetes Daughter   . Diabetes Son   . Hypertension Son     Social History Social History  Substance Use Topics  . Smoking status: Former Smoker    Quit date: 03/17/1983  .  Smokeless tobacco: Never Used  . Alcohol use No     Allergies   Patient has no known allergies.   Review of Systems Review of Systems Ten systems are reviewed and are negative for acute change except as noted in the HPI   Physical Exam Updated Vital Signs BP (!) 129/44 (BP Location: Right Arm)   Pulse 78   Temp 97.9 F (36.6 C) (Oral)   Resp 17   SpO2 97%   Physical Exam  Constitutional: She is oriented to person, place, and time. She appears well-developed and well-nourished. No distress.  HENT:  Head: Normocephalic and atraumatic.  Nose: Nose normal.  Eyes: Conjunctivae and EOM are normal. Pupils are equal, round, and reactive to light. Right eye exhibits no discharge. Left eye exhibits no discharge. No scleral icterus.  Neck: Normal range of motion. Neck supple.  Cardiovascular: Normal rate and regular rhythm.  Exam reveals no gallop and no friction rub.   No murmur heard. Pulsatile bleeding of left femoral AV fistula  Pulmonary/Chest: Effort normal and breath sounds normal. No stridor. No respiratory distress. She has no rales.  Abdominal: Soft. She exhibits no distension. There is no tenderness.  Musculoskeletal: She exhibits no edema or tenderness.  Neurological: She is alert and oriented to person, place, and time.  Skin: Skin is warm and dry. No rash noted. She is not diaphoretic. No erythema.  Psychiatric: She has a normal mood and affect.  Vitals reviewed.    ED Treatments / Results  Labs (all labs ordered are listed, but only abnormal results are displayed) Labs Reviewed  CBC WITH DIFFERENTIAL/PLATELET - Abnormal; Notable for the following:       Result Value   WBC 3.3 (*)    RBC 3.60 (*)    Hemoglobin 11.2 (*)    HCT 34.5 (*)    RDW 15.8 (*)    Platelets 122 (*)    All other components within normal limits  BASIC METABOLIC PANEL - Abnormal; Notable for the following:    Chloride 95 (*)    Glucose, Bld 105 (*)    Creatinine, Ser 3.49 (*)     Calcium 8.2 (*)    GFR calc non Af Amer 12 (*)    GFR calc Af Amer 14 (*)    All other components  within normal limits  PROTIME-INR - Abnormal; Notable for the following:    Prothrombin Time 38.4 (*)    All other components within normal limits  CBC - Abnormal; Notable for the following:    WBC 3.8 (*)    RBC 3.03 (*)    Hemoglobin 9.3 (*)    HCT 29.1 (*)    RDW 15.9 (*)    Platelets 122 (*)    All other components within normal limits  GLUCOSE, CAPILLARY - Abnormal; Notable for the following:    Glucose-Capillary 143 (*)    All other components within normal limits  MRSA PCR SCREENING  HEMOGLOBIN A1C  CBC  COMPREHENSIVE METABOLIC PANEL  PROTIME-INR  TYPE AND SCREEN  PREPARE RBC (CROSSMATCH)  PREPARE FRESH FROZEN PLASMA    EKG  EKG Interpretation None       Radiology No results found.  Procedures Procedures (including critical care time) CRITICAL CARE Performed by: Grayce Sessions Cardama Total critical care time: 35 minutes Critical care time was exclusive of separately billable procedures and treating other patients. Critical care was necessary to treat or prevent imminent or life-threatening deterioration. Critical care was time spent personally by me on the following activities: development of treatment plan with patient and/or surrogate as well as nursing, discussions with consultants, evaluation of patient's response to treatment, examination of patient, obtaining history from patient or surrogate, ordering and performing treatments and interventions, ordering and review of laboratory studies, ordering and review of radiographic studies, pulse oximetry and re-evaluation of patient's condition.   Medications Ordered in ED Medications  cinacalcet (SENSIPAR) tablet 60 mg (not administered)  loratadine (CLARITIN) tablet 10 mg (not administered)  polyethylene glycol (MIRALAX / GLYCOLAX) packet 17 g (not administered)  sevelamer carbonate (RENVELA) powder PACK 2.4 g  (not administered)  traMADol (ULTRAM) tablet 50 mg (50 mg Oral Given 02/09/16 0036)  lisinopril (PRINIVIL,ZESTRIL) tablet 30 mg (30 mg Oral Not Given 02/09/16 0036)  insulin glargine (LANTUS) injection 10 Units (10 Units Subcutaneous Given 02/09/16 0037)  latanoprost (XALATAN) 0.005 % ophthalmic solution 1 drop (1 drop Left Eye Given 02/09/16 0034)  rOPINIRole (REQUIP) tablet 0.5 mg (0.5 mg Oral Given 02/09/16 0037)  pravastatin (PRAVACHOL) tablet 40 mg (not administered)  sodium chloride flush (NS) 0.9 % injection 3 mL (3 mLs Intravenous Given 02/09/16 0038)  sodium chloride flush (NS) 0.9 % injection 3 mL (not administered)  0.9 %  sodium chloride infusion (not administered)  ondansetron (ZOFRAN) tablet 4 mg (not administered)    Or  ondansetron (ZOFRAN) injection 4 mg (not administered)  acetaminophen (TYLENOL) tablet 650 mg (not administered)    Or  acetaminophen (TYLENOL) suppository 650 mg (not administered)  insulin aspart (novoLOG) injection 0-9 Units (not administered)  sevelamer carbonate (RENVELA) powder PACK 2.4 g (not administered)  tranexamic acid (CYKLOKAPRON) injection 500 mg (500 mg Topical Given 02/08/16 1806)  0.9 %  sodium chloride infusion ( Intravenous New Bag/Given 02/09/16 0034)  phytonadione (VITAMIN K) 10 mg in dextrose 5 % 50 mL IVPB (10 mg Intravenous New Bag/Given 02/08/16 2110)     Initial Impression / Assessment and Plan / ED Course  I have reviewed the triage vital signs and the nursing notes.  Pertinent labs & imaging results that were available during my care of the patient were reviewed by me and considered in my medical decision making (see chart for details).  Clinical Course     Mildly super therapeutic INR. Applying compression dressing with TXA. Patient continued to bleed through.  Became hypotensive which responded to IV fluids. Vitamin K given. Blood transfusions and FFP ordered.  Tight compression wrapping applied, which had significant  improvement in bleeding. Bleed was from a pulsatile bleed to a mild slight ooze. Pressure dressing reapplied. Discussed case with vascular surgery who will follow along during patient's admission.  Patient admitted to stepdown unit under hospitalist service for close monitoring and continued management.    Final Clinical Impressions(s) / ED Diagnoses   Final diagnoses:  Hemorrhage  Other specified hypotension      Fatima Blank, MD 02/09/16 0126

## 2016-02-08 NOTE — ED Notes (Signed)
Attmepted report x1.  

## 2016-02-08 NOTE — H&P (Signed)
[facility] H&P    Patient Demographics:    Stefanie [name], is a 70 y.o. female  MRN: [id number]  DOB - 1945/05/08  Admit Date - 02/08/2016    Outpatient Primary MD for the patient is [name], [name], MD  Patient coming from: Home  Chief Complaint  Patient presents with  . Other    graft bleeding      HPI:    Stefanie [name]  is a 70 y.o. female, With history of ESRD on dialysis with left femoral AV fistula, CAD, anemia, came to the ED from dialysis center for uncontrolled bleeding from left femoral AV fistula after dialysis. Patient is on Coumadin for DVT, INR today 3.80. Patient's blood pressure dropped initially in the ED with systolic blood pressure in 70s with map of 43. But improved compared pressure stable. Patient was given vitamin K, also transfused fresh frozen plasma. Vascular surgery was consulted by the ED physician, no intervention planned at this time.  Patient denies passing out, no chest pain or shortness of breath. No nausea vomiting or diarrhea.    Review of systems:    In addition to the HPI above,  No Fever-chills, No Headache, No changes with Vision or hearing, No problems swallowing food or Liquids, No Chest pain, Cough or Shortness of Breath, No new weakness, tingling, numbness in any extremity, No recent weight gain or loss, No significant Mental Stressors.  A full 10 point Review of Systems was done, except as stated above, all other Review of Systems were negative.   With Past History of the following :    Past Medical History:  Diagnosis Date  . Anemia   . CAD (coronary artery disease)   . CHF (congestive heart failure) ([place])   . Constipation   . CVA (cerebral vascular accident) ([place]) [place]   left sided weakness  . Diabetes mellitus   . End stage renal disease ([place])    initiated HD 2006  . Hx of cardiovascular stress test    a. MV 03/2010:  no ischemia, EF 55%   . Hx of echocardiogram    a. Echo 03/2010: EF 55-60%, Gr 1 diast dysfn, trivial MS, mild to mod LAE, PASP 34, ;  b. Echo 11/13: mild LVH, EF 60%, Gr 1 diast dysfn, Ao sclerosis, no AS, MAC, slight MS, mean 3 mmHg, PASP 34  . Hyperlipidemia   . Hypertension   . Protein malnutrition ([place])   . Renal insufficiency   . Retinal detachment, bilateral   . Secondary hyperparathyroidism ([place])       Past Surgical History:  Procedure Laterality Date  . ARTERIOVENOUS GRAFT PLACEMENT     BUE numerous times  . ARTERIOVENOUS GRAFT PLACEMENT  11/13/10   Lt femoral - Dr. Kellie Simmering  . CARDIAC CATHETERIZATION N/A 02/05/2015   Procedure: Left Heart Cath and Coronary Angiography;  Surgeon: Jettie Booze, MD;  Location: [facility] CV LAB;  Service: Cardiovascular;  Laterality: N/A;  . CATARACT EXTRACTION    . DG AV DIALYSIS GRAFT DECLOT OR  06/06/11, 08/26/11  performed in IR  . PERIPHERAL VASCULAR CATHETERIZATION N/A 02/06/2015   Procedure: A/V Shuntogram;  Surgeon: Elam Dutch, MD;  Location: [place] CV LAB;  Service: Cardiovascular;  Laterality: N/A;  . SHUNTOGRAM N/A 05/29/2013   Procedure: Earney Mallet;  Surgeon: Conrad [place], MD;  Location: [facility] CATH LAB;  Service: Cardiovascular;  Laterality: N/A;  . SHUNTOGRAM Left 03/01/2014   Procedure: Earney Mallet;  Surgeon: Conrad Denning, MD;  Location: [facility] CATH LAB;  Service: Cardiovascular;  Laterality: Left;  . TUBAL LIGATION        Social History:      Social History  Substance Use Topics  . Smoking status: Former Smoker    Quit date: 03/17/1983  . Smokeless tobacco: Never Used  . Alcohol use No       Family History :     Family History  Problem Relation Age of Onset  . Heart disease Mother   . Diabetes Mother   . Hypertension Mother   . Heart attack Mother   . Diabetes Other   . Kidney disease Other   . Cardiomyopathy    . Diabetes Daughter   . Diabetes Son   . Hypertension Son       Home Medications:   Prior to Admission  medications   Medication Sig Start Date End Date Taking? Authorizing Provider  cinacalcet (SENSIPAR) 30 MG tablet Take 60 mg by mouth daily.    Historical Provider, MD  glycerin adult (GLYCERIN ADULT) 2 g SUPP Place 1 suppository rectally once. 09/04/15   Carlisle Cater, PA-C  Insulin Degludec (TRESIBA FLEXTOUCH) 100 UNIT/ML SOPN Inject 8 Units into the skin at bedtime.    Historical Provider, MD  insulin glargine (LANTUS) 100 UNIT/ML injection Inject 0.1 mLs (10 Units total) into the skin at bedtime. 02/25/15   Ivan Anchors Love, PA-C  latanoprost (XALATAN) 0.005 % ophthalmic solution Place 1 drop into the left eye at bedtime.    Historical Provider, MD  lidocaine-prilocaine (EMLA) cream Apply 1 application topically as needed (topical anesthesia for hemodialysis if Gebauers and Lidocaine injection are ineffective.). Patient taking differently: Apply 1 application topically 3 (three) times a week.  02/25/15   Ivan Anchors Love, PA-C  lisinopril (PRINIVIL,ZESTRIL) 30 MG tablet Take 30 mg by mouth at bedtime.    Historical Provider, MD  loratadine (CLARITIN) 10 MG tablet Take 10 mg by mouth daily.    Historical Provider, MD  nitroGLYCERIN (NITROSTAT) 0.4 MG SL tablet Place 1 tablet (0.4 mg total) under the tongue every 5 (five) minutes as needed for chest pain. 02/25/15   Bary Leriche, PA-C  polyethylene glycol [facility]) packet Use 17 gms three times daily until bowels move, with maximum of 3 consecutive days 09/02/15   Charlann Lange, PA-C  pravastatin (PRAVACHOL) 40 MG tablet Take 40 mg by mouth daily.  02/11/14   Historical Provider, MD  rOPINIRole (REQUIP) 0.5 MG tablet Take 0.5 mg by mouth at bedtime. 07/27/14   Historical Provider, MD  sevelamer carbonate (RENVELA) 2.4 g PACK Take 2.4 g by mouth 3 (three) times daily with meals. And 1 packet with each snack    Historical Provider, MD  sorbitol 70 % solution Take 30 mLs by mouth daily as needed (for constipation).    Historical Provider, MD  traMADol  (ULTRAM) 50 MG tablet Take 50 mg by mouth 2 (two) times daily.    Historical Provider, MD  warfarin (COUMADIN) 7.5 MG tablet Take 7.5 mg by mouth every evening.  Historical Provider, MD     Allergies:    No Known Allergies   Physical Exam:   Vitals  Blood pressure 104/90, pulse 78, temperature 97.9 F (36.6 C), temperature source Oral, resp. rate 21, SpO2 97 %.  1.  General: Appears in no acute distress  2. Psychiatric:  Intact judgement and  insight, awake alert, oriented x 3.  3. Neurologic: No focal neurological deficits, all cranial nerves intact.Strength 5/5 all 4 extremities, sensation intact all 4 extremities, plantars down going.  4. Eyes :  anicteric sclerae, moist conjunctivae with no lid lag. PERRLA.  5. ENMT:  Oropharynx clear with moist mucous membranes and good dentition  6. Neck:  supple, no cervical lymphadenopathy appriciated, No thyromegaly  7. Respiratory : Normal respiratory effort, good air movement bilaterally,clear to  auscultation bilaterally  8. Cardiovascular : RRR, no gallops, rubs or murmurs, no leg edema  9. Gastrointestinal:  Positive bowel sounds, abdomen soft, non-tender to palpation,no hepatosplenomegaly, no rigidity or guarding       10. Skin:  No cyanosis, normal texture and turgor, no rash, lesions or ulcers  11.Musculoskeletal:  Good muscle tone,  joints appear normal , no effusions,  normal range of motion    Data Review:    CBC  Recent Labs Lab 02/08/16 1756 02/08/16 2034  WBC 3.3* 3.8*  HGB 11.2* 9.3*  HCT 34.5* 29.1*  PLT 122* 122*  MCV 95.8 96.0  MCH 31.1 30.7  MCHC 32.5 32.0  RDW 15.8* 15.9*  LYMPHSABS 1.1  --   MONOABS 0.3  --   EOSABS 0.1  --   BASOSABS 0.0  --    ------------------------------------------------------------------------------------------------------------------  Chemistries   Recent Labs Lab 02/08/16 1756  NA 137  K 4.4  CL 95*  CO2 29  GLUCOSE 105*  BUN 14  CREATININE  3.49*  CALCIUM 8.2*   ------------------------------------------------------------------------------------------------------------------  ------------------------------------------------------------------------------------------------------------------ Coagulation Profile:  Recent Labs Lab 02/08/16 1756  INR 3.80    --------------------------------------------------------------------------------------------------------------- Urine analysis: No results found for: COLORURINE, APPEARANCEUR, LABSPEC, PHURINE, GLUCOSEU, HGBUR, BILIRUBINUR, KETONESUR, PROTEINUR, UROBILINOGEN, NITRITE, LEUKOCYTESUR    Imaging Results:       Assessment & Plan:    Active Problems:   Essential hypertension   ESRD on dialysis ([place])   ESRD (end stage renal disease) ([place])   DVT (deep venous thrombosis) ([facility])   Hemorrhage   Anemia    1. Bleeding from left femoral AV fistula- resolved after compression dressing, vascular surgery Dr. Oneida Alar consulted by ED physician. No intervention planned at this time. 2. Coagulopathy- INR elevated 3.38, transfused 1 units fresh frozen plasma, given vitamin K 10 mg IV 1, and tranexamic acid, pharmacy to manage Coumadin. 3. ESRD on hemodialysis- patient gets dialysis Tuesday Thursday and Saturday, got dialysis today next dialysis is due on Tuesday.  4. Diabetes mellitus- continue Lantus, start sliding scale insulin with NovoLog. 5. Hypotension- patient initially had to sit of hypotension in the ED which has resolved. Will closely monitor patient on stepdown unit. 6. Anemia- hemoglobin is 9.2, transfused 2 units PRBC. Check CBC in a.m.   DVT Prophylaxis-   On coumadin  AM Labs Ordered, also please review Full Orders  Family Communication: No family at bedside  Code Status:  DO NOT RESUSCITATE  Admission status: Observation    Time spent in minutes : 50 minutes   Tip Atienza S M.D on 02/08/2016 at 10:20 PM  Between 7am to 7pm - Pager - [phone]. After  7pm go to [email or web address] - password [facility]  [facility] - Office  [phone]

## 2016-02-08 NOTE — ED Triage Notes (Signed)
Pt here from dialysis center. Graft has been bleeding for 2 hours. Pt is holding pressure to graft to left thigh. Pt reports this has happened before and required stitches.

## 2016-02-08 NOTE — Progress Notes (Signed)
ANTICOAGULATION CONSULT NOTE - Initial Consult  Pharmacy Consult for Warfarin  Indication: DVT  No Known Allergies  Patient Measurements: Height: 5\' 4"  (162.6 cm) Weight: 159 lb 2.8 oz (72.2 kg) IBW/kg (Calculated) : 54.7 Vital Signs: Temp: 98.2 F (36.8 C) (11/25 2338) Temp Source: Oral (11/25 2338) BP: 93/44 (11/25 2300) Pulse Rate: 81 (11/25 2338)  Labs:  Recent Labs  02/08/16 1756 02/08/16 2034  HGB 11.2* 9.3*  HCT 34.5* 29.1*  PLT 122* 122*  LABPROT 38.4*  --   INR 3.80  --   CREATININE 3.49*  --     Estimated Creatinine Clearance: 14.6 mL/min (by C-G formula based on SCr of 3.49 mg/dL (H)).   Medical History: Past Medical History:  Diagnosis Date  . Anemia   . CAD (coronary artery disease)   . CHF (congestive heart failure) ([place])   . Constipation   . CVA (cerebral vascular accident) ([place]) [place]   left sided weakness  . Diabetes mellitus   . End stage renal disease ([place])    initiated HD 2006  . Hx of cardiovascular stress test    a. MV 03/2010:  no ischemia, EF 55%  . Hx of echocardiogram    a. Echo 03/2010: EF 55-60%, Gr 1 diast dysfn, trivial MS, mild to mod LAE, PASP 34, ;  b. Echo 11/13: mild LVH, EF 60%, Gr 1 diast dysfn, Ao sclerosis, no AS, MAC, slight MS, mean 3 mmHg, PASP 34  . Hyperlipidemia   . Hypertension   . Protein malnutrition ([place])   . Renal insufficiency   . Retinal detachment, bilateral   . Secondary hyperparathyroidism [facility])     Assessment: 70 y/o F with ESRD on HD here with bleeding fistula. On warfarin PTA for hx DVT with INR elevated at 3.8. Given 10 mg Vit K. Hgb 9.3. Plts 122.   Goal of Therapy:  INR 2-3 Monitor platelets by anticoagulation protocol: Yes   Plan:  -Hold warfarin -Daily PT/INR, re-start warfarin as INR allows if bleeding issues have subsided   Narda Bonds 02/08/2016,11:44 PM

## 2016-02-08 NOTE — ED Notes (Signed)
This RN and Rodman Pickle applied tranexamic acid to left inner thigh where site of bleeding is located. Applied pressure and occlusive dressing.

## 2016-02-09 DIAGNOSIS — Z8249 Family history of ischemic heart disease and other diseases of the circulatory system: Secondary | ICD-10-CM | POA: Diagnosis not present

## 2016-02-09 DIAGNOSIS — Z992 Dependence on renal dialysis: Secondary | ICD-10-CM | POA: Diagnosis not present

## 2016-02-09 DIAGNOSIS — I2510 Atherosclerotic heart disease of native coronary artery without angina pectoris: Secondary | ICD-10-CM | POA: Diagnosis present

## 2016-02-09 DIAGNOSIS — N2581 Secondary hyperparathyroidism of renal origin: Secondary | ICD-10-CM | POA: Diagnosis present

## 2016-02-09 DIAGNOSIS — Z79899 Other long term (current) drug therapy: Secondary | ICD-10-CM | POA: Diagnosis not present

## 2016-02-09 DIAGNOSIS — I5042 Chronic combined systolic (congestive) and diastolic (congestive) heart failure: Secondary | ICD-10-CM

## 2016-02-09 DIAGNOSIS — D62 Acute posthemorrhagic anemia: Secondary | ICD-10-CM

## 2016-02-09 DIAGNOSIS — I132 Hypertensive heart and chronic kidney disease with heart failure and with stage 5 chronic kidney disease, or end stage renal disease: Secondary | ICD-10-CM | POA: Diagnosis present

## 2016-02-09 DIAGNOSIS — I82402 Acute embolism and thrombosis of unspecified deep veins of left lower extremity: Secondary | ICD-10-CM | POA: Diagnosis not present

## 2016-02-09 DIAGNOSIS — D689 Coagulation defect, unspecified: Secondary | ICD-10-CM | POA: Diagnosis present

## 2016-02-09 DIAGNOSIS — I9589 Other hypotension: Secondary | ICD-10-CM

## 2016-02-09 DIAGNOSIS — E1140 Type 2 diabetes mellitus with diabetic neuropathy, unspecified: Secondary | ICD-10-CM | POA: Diagnosis present

## 2016-02-09 DIAGNOSIS — R578 Other shock: Secondary | ICD-10-CM | POA: Diagnosis present

## 2016-02-09 DIAGNOSIS — I69322 Dysarthria following cerebral infarction: Secondary | ICD-10-CM | POA: Diagnosis not present

## 2016-02-09 DIAGNOSIS — Z66 Do not resuscitate: Secondary | ICD-10-CM | POA: Diagnosis present

## 2016-02-09 DIAGNOSIS — R58 Hemorrhage, not elsewhere classified: Secondary | ICD-10-CM

## 2016-02-09 DIAGNOSIS — Z86718 Personal history of other venous thrombosis and embolism: Secondary | ICD-10-CM | POA: Diagnosis not present

## 2016-02-09 DIAGNOSIS — Z833 Family history of diabetes mellitus: Secondary | ICD-10-CM | POA: Diagnosis not present

## 2016-02-09 DIAGNOSIS — N186 End stage renal disease: Secondary | ICD-10-CM | POA: Diagnosis not present

## 2016-02-09 DIAGNOSIS — T82838A Hemorrhage of vascular prosthetic devices, implants and grafts, initial encounter: Secondary | ICD-10-CM | POA: Diagnosis present

## 2016-02-09 DIAGNOSIS — Z87891 Personal history of nicotine dependence: Secondary | ICD-10-CM | POA: Diagnosis not present

## 2016-02-09 DIAGNOSIS — Z7901 Long term (current) use of anticoagulants: Secondary | ICD-10-CM | POA: Diagnosis not present

## 2016-02-09 DIAGNOSIS — E785 Hyperlipidemia, unspecified: Secondary | ICD-10-CM | POA: Diagnosis present

## 2016-02-09 DIAGNOSIS — T45515A Adverse effect of anticoagulants, initial encounter: Secondary | ICD-10-CM | POA: Diagnosis present

## 2016-02-09 DIAGNOSIS — E1122 Type 2 diabetes mellitus with diabetic chronic kidney disease: Secondary | ICD-10-CM | POA: Diagnosis present

## 2016-02-09 DIAGNOSIS — Y841 Kidney dialysis as the cause of abnormal reaction of the patient, or of later complication, without mention of misadventure at the time of the procedure: Secondary | ICD-10-CM | POA: Diagnosis present

## 2016-02-09 DIAGNOSIS — I69354 Hemiplegia and hemiparesis following cerebral infarction affecting left non-dominant side: Secondary | ICD-10-CM | POA: Diagnosis not present

## 2016-02-09 DIAGNOSIS — Z794 Long term (current) use of insulin: Secondary | ICD-10-CM | POA: Diagnosis not present

## 2016-02-09 LAB — PROTIME-INR
INR: 1.5
PROTHROMBIN TIME: 18.3 s — AB (ref 11.4–15.2)

## 2016-02-09 LAB — CBC
HCT: 26.4 % — ABNORMAL LOW (ref 36.0–46.0)
Hemoglobin: 8.4 g/dL — ABNORMAL LOW (ref 12.0–15.0)
MCH: 30.8 pg (ref 26.0–34.0)
MCHC: 31.8 g/dL (ref 30.0–36.0)
MCV: 96.7 fL (ref 78.0–100.0)
PLATELETS: 115 10*3/uL — AB (ref 150–400)
RBC: 2.73 MIL/uL — AB (ref 3.87–5.11)
RDW: 16.4 % — ABNORMAL HIGH (ref 11.5–15.5)
WBC: 3.1 10*3/uL — AB (ref 4.0–10.5)

## 2016-02-09 LAB — COMPREHENSIVE METABOLIC PANEL
ALBUMIN: 2.9 g/dL — AB (ref 3.5–5.0)
ALT: 13 U/L — AB (ref 14–54)
AST: 19 U/L (ref 15–41)
Alkaline Phosphatase: 55 U/L (ref 38–126)
Anion gap: 10 (ref 5–15)
BUN: 23 mg/dL — AB (ref 6–20)
CHLORIDE: 97 mmol/L — AB (ref 101–111)
CO2: 30 mmol/L (ref 22–32)
CREATININE: 5.11 mg/dL — AB (ref 0.44–1.00)
Calcium: 8.2 mg/dL — ABNORMAL LOW (ref 8.9–10.3)
GFR calc Af Amer: 9 mL/min — ABNORMAL LOW (ref >60)
GFR calc non Af Amer: 8 mL/min — ABNORMAL LOW (ref >60)
GLUCOSE: 127 mg/dL — AB (ref 65–99)
Potassium: 5.1 mmol/L (ref 3.5–5.1)
SODIUM: 137 mmol/L (ref 135–145)
Total Bilirubin: 0.6 mg/dL (ref 0.3–1.2)
Total Protein: 5.5 g/dL — ABNORMAL LOW (ref 6.5–8.1)

## 2016-02-09 LAB — GLUCOSE, CAPILLARY
GLUCOSE-CAPILLARY: 123 mg/dL — AB (ref 65–99)
GLUCOSE-CAPILLARY: 143 mg/dL — AB (ref 65–99)
GLUCOSE-CAPILLARY: 189 mg/dL — AB (ref 65–99)
GLUCOSE-CAPILLARY: 55 mg/dL — AB (ref 65–99)
Glucose-Capillary: 75 mg/dL (ref 65–99)
Glucose-Capillary: 110 mg/dL — ABNORMAL HIGH (ref 65–99)

## 2016-02-09 LAB — MRSA PCR SCREENING: MRSA by PCR: NEGATIVE

## 2016-02-09 MED ORDER — INSULIN GLARGINE 100 UNIT/ML ~~LOC~~ SOLN
4.0000 [IU] | Freq: Every day | SUBCUTANEOUS | Status: DC
  Administered 2016-02-09: 4 [IU] via SUBCUTANEOUS
  Filled 2016-02-09 (×2): qty 0.04

## 2016-02-09 MED ORDER — OXYCODONE-ACETAMINOPHEN 5-325 MG PO TABS
1.0000 | ORAL_TABLET | Freq: Once | ORAL | Status: AC
  Administered 2016-02-09: 1 via ORAL
  Filled 2016-02-09: qty 1

## 2016-02-09 MED ORDER — SODIUM CHLORIDE 0.9 % IV SOLN: Freq: Once | INTRAVENOUS | Status: DC

## 2016-02-09 NOTE — Progress Notes (Addendum)
ANTICOAGULATION CONSULT NOTE -Follow up  Pharmacy Consult for Warfarin  Indication: h/o left common femoral vein  DVT (diagnosed Nov 2016)  No Known Allergies  Patient Measurements: Height: 5\' 4"  (162.6 cm) Weight: 159 lb 2.8 oz (72.2 kg) IBW/kg (Calculated) : 54.7 Vital Signs: Temp: 98.4 F (36.9 C) (11/26 0741) Temp Source: Oral (11/26 0741) BP: 116/44 (11/26 1247) Pulse Rate: 78 (11/26 1247)  Labs:  Recent Labs  02/08/16 1756 02/08/16 2034 02/09/16 0925  HGB 11.2* 9.3* 8.4*  HCT 34.5* 29.1* 26.4*  PLT 122* 122* 115*  LABPROT 38.4*  --  18.3*  INR 3.80  --  1.50  CREATININE 3.49*  --  5.11*    Estimated Creatinine Clearance: 10 mL/min (by C-G formula based on SCr of 5.11 mg/dL (H)).   Medical History: Past Medical History:  Diagnosis Date  . Anemia   . CAD (coronary artery disease)   . CHF (congestive heart failure) ([place])   . Constipation   . CVA (cerebral vascular accident) ([place]) [place]   left sided weakness  . Diabetes mellitus   . End stage renal disease ([place])    initiated HD 2006  . Hx of cardiovascular stress test    a. MV 03/2010:  no ischemia, EF 55%  . Hx of echocardiogram    a. Echo 03/2010: EF 55-60%, Gr 1 diast dysfn, trivial MS, mild to mod LAE, PASP 34, ;  b. Echo 11/13: mild LVH, EF 60%, Gr 1 diast dysfn, Ao sclerosis, no AS, MAC, slight MS, mean 3 mmHg, PASP 34  . Hyperlipidemia   . Hypertension   . Protein malnutrition ([place])   . Renal insufficiency   . Retinal detachment, bilateral   . Secondary hyperparathyroidism [facility])     Assessment: 70 y/o F with ESRD on HD here with bleeding fistula. On warfarin PTA for hx left common femoral vein  DVT with INR elevated at 3.8 on admission last night 02/08/16. The patient was giventranexamic acid, 1 unit of FFP and vitamin K 10 mg IV 1.  INR = 1.5 today is SUBtherpeutic after Vitamin K 10mg  IVPB given last night.  Hgb 11.2--> 9.3, low stable at 8.4, pltc low stable at 115K MD today notes that  bleeding appears to have stopped .  Vascular surgery consulted--no intervention presently  She was diagnosed with a left common femoral vein DVT in November 2016 after which, the patient was started on warfarin. Dr. Carles Collet notes that  the patient had a shuntogram which revealed a patent AV graft. It was noted that her DVT was distal to her AV graft at that time. Dr. Carles Collet questions whether the patient needs to be started back with warfarin at all--will discuss with vascular surgery.  [facility] says they will schedule elective thigh graft shuntogram as outpatient.    Goal of Therapy:  INR 2-3 Monitor platelets by anticoagulation protocol: Yes   Plan:  -I will page Dr. Carles Collet to see if plan to continue coumadin. -Daily PT/INR, re-start warfarin as INR allows if bleeding issues have subsided   Nicole Cella, RPh Clinical Pharmacist Pager: [phone] 02/09/2016,3:49 PM   ADDENDUM:  Dr. Carles Collet says to discontinue coumadin for this patient. He will DC the protocol/consult.   Thanks,  Nicole Cella, RPh Clinical Pharmacist 02/09/2016 4:11 PM

## 2016-02-09 NOTE — Progress Notes (Addendum)
PROGRESS NOTE  Stefanie [name] CBU:[id number] DOB: 01/05/1946 DOA: 02/08/2016 PCP: Bonnita Nasuti, MD  Brief History:  70 year old female with a history of ESRD, systolic and diastolic CHF, coronary artery disease, stroke with residual dysarthria, diabetes mellitus, hypertension, hyperlipidemia presented with uncontrolled bleeding from her left femoral AV graft after dialysis on 12/09/2015. Upon presentation to the emergency department, the patient had postop bleeding and became hypotensive with blood pressure as low as 67/47. Compression dressing was applied, and vascular surgery was consulted. They did not feel the patient needed any urgent surgical intervention. The patient's INR was 3.80 at the time of presentation. The patient was given tranexamic acid, 1 unit of FFP and vitamin K 10 mg IV 1.  The patient was given IV fluids with improvement of her blood pressure. The patient was admitted to the stepdown unit for close observation.  Notably, the patient was diagnosed with a left common femoral vein DVT in November 2016 after which, the patient was started on warfarin. The patient has been on anticoagulation since that hospital admission. Notably, the patient had a shuntogram which revealed a patent AV graft. It was noted that her DVT was distal to her AV graft at that time.  Assessment/Plan: Hemorrhagic shock/acute blood loss anemia -Secondary to bleeding Location from left AV graft -Bleeding appears to have stopped -Patient is now hemodynamically stable -Hemoglobin dropped to 11.2--> 9.3 -Monitor serial hemoglobin -Appreciate vascular surgery consult--no intervention presently  Coagulopathy -Secondary to warfarin -Presenting INR 3.80 -Given FFP one unit, vitamin K, tranexamic acid -Question whether the patient needs to be started back with warfarin at all--will discuss with vascular surgery -repeat INR  ESRD -Last dialysis 02/08/2016 -will consult nephrology if pt stays  beyond next scheduled HD  Chronic Systolic and diastolic CHF -Clinically compensated -02/02/2015 echo EF 30-35%, diffuse HK, grade 2 DD, PASP 55  Coronary artery disease -No anginal symptoms  Hypertension -Hold lisinopril in the setting of hemorrhagic shock  Diabetes mellitus type 2  -NovoLog sliding scale  -Reduced dose Lantus  -Hemoglobin A1c   Hyperlipidemia -Continue statin    Disposition Plan:   Home in 1-2 days  Family Communication:   No Family at bedside--Total time spent 35 minutes.  Greater than 50% spent face to face counseling and coordinating care.   Consultants:  [facility]  Code Status:  DNR  DVT Prophylaxis:  Holding in setting of bleeding complication   Procedures: As Listed in Progress Note Above  Antibiotics: None    Subjective: Patient denies fevers, chills, headache, chest pain, dyspnea, nausea, vomiting, diarrhea, abdominal pain, dysuria, hematuria, hematochezia, and melena.   Objective: Vitals:   02/08/16 2328 02/08/16 2338 02/09/16 0435 02/09/16 0741  BP:   (!) 144/58   Pulse:  81 77   Resp:  18 13   Temp: 98.1 F (36.7 C) 98.2 F (36.8 C) 98.1 F (36.7 C) 98.4 F (36.9 C)  TempSrc: Oral Oral Oral Oral  SpO2:  99% 96%   Weight: 72.3 kg (159 lb 4.8 oz) 72.2 kg (159 lb 2.8 oz)    Height: 5\' 4"  (1.626 m) 5\' 4"  (1.626 m)      Intake/Output Summary (Last 24 hours) at 02/09/16 0748 Last data filed at 02/09/16 0500  Gross per 24 hour  Intake              250 ml  Output  0 ml  Net              250 ml   Weight change:  Exam:   General:  Pt is alert, follows commands appropriately, not in acute distress  HEENT: No icterus, No thrush, No neck mass, [place]/AT  Cardiovascular: RRR, S1/S2, no rubs, no gallops  Respiratory: CTA bilaterally, no wheezing, no crackles, no rhonchi  Abdomen: Soft/+BS, non tender, non distended, no guarding  Extremities: No edema, No lymphangitis, No petechiae, No rashes, no synovitis;  L-AVG--pulsatile, no blood through dressing   Data Reviewed: I have personally reviewed following labs and imaging studies Basic Metabolic Panel:  Recent Labs Lab 02/08/16 1756  NA 137  K 4.4  CL 95*  CO2 29  GLUCOSE 105*  BUN 14  CREATININE 3.49*  CALCIUM 8.2*   Liver Function Tests: No results for input(s): AST, ALT, ALKPHOS, BILITOT, PROT, ALBUMIN in the last 168 hours. No results for input(s): LIPASE, AMYLASE in the last 168 hours. No results for input(s): AMMONIA in the last 168 hours. Coagulation Profile:  Recent Labs Lab 02/08/16 1756  INR 3.80   CBC:  Recent Labs Lab 02/08/16 1756 02/08/16 2034  WBC 3.3* 3.8*  NEUTROABS 1.9  --   HGB 11.2* 9.3*  HCT 34.5* 29.1*  MCV 95.8 96.0  PLT 122* 122*   Cardiac Enzymes: No results for input(s): CKTOTAL, CKMB, CKMBINDEX, TROPONINI in the last 168 hours. BNP: Invalid input(s): POCBNP CBG:  Recent Labs Lab 02/09/16 0032 02/09/16 0744  GLUCAP 143* 123*   HbA1C: No results for input(s): HGBA1C in the last 72 hours. Urine analysis: No results found for: COLORURINE, APPEARANCEUR, LABSPEC, [place], GLUCOSEU, HGBUR, BILIRUBINUR, [place], [place], UROBILINOGEN, NITRITE, LEUKOCYTESUR Sepsis Labs: @LABRCNTIP (procalcitonin:4,lacticidven:4) ) Recent Results (from the past 240 hour(s))  MRSA PCR Screening     Status: None   Collection Time: 02/08/16 11:30 PM  Result Value Ref Range Status   MRSA by PCR NEGATIVE NEGATIVE Final    Comment:        The GeneXpert MRSA Assay (FDA approved for NASAL specimens only), is one component of a comprehensive MRSA colonization surveillance program. It is not intended to diagnose MRSA infection nor to guide or monitor treatment for MRSA infections.      Scheduled Meds: . cinacalcet  60 mg Oral Q breakfast  . insulin aspart  0-9 Units Subcutaneous TID WC  . insulin glargine  10 Units Subcutaneous QHS  . latanoprost  1 drop Left Eye QHS  . lisinopril  30 mg Oral  QHS  . loratadine  10 mg Oral Daily  . pravastatin  40 mg Oral Daily  . rOPINIRole  0.5 mg Oral QHS  . sevelamer carbonate  2.4 g Oral TID WC  . sodium chloride flush  3 mL Intravenous Q12H  . traMADol  50 mg Oral BID   Continuous Infusions:  Procedures/Studies: No results found.  Stefanie Waymire, DO  [facility] Pager [phone]  If 7PM-7AM, please contact night-coverage [email or web address] Password [email or web address] 02/09/2016, 7:48 AM   LOS: 1 day

## 2016-02-09 NOTE — Discharge Summary (Addendum)
Physician Discharge Summary  Stefanie [name] BPZ:[id number] DOB: [birth year] DOA: 02/08/2016  PCP: Bonnita Nasuti, MD  Admit date: 02/08/2016 Discharge date: 02/10/16  Admitted From: home Disposition:  home  Recommendations for Outpatient Follow-up:  1. Follow up with PCP in 1-2 weeks 2. Please obtain CBC in one week   Home Health: No Equipment/Devices:None  Discharge Condition:Stable CODE STATUS:FULL Diet recommendation: Heart Healthy   Brief/Interim Summary: 70 year old female with a history of ESRD, systolic and diastolic CHF, coronary artery disease, stroke with residual dysarthria, diabetes mellitus, hypertension, hyperlipidemia presented with uncontrolled bleeding from her left femoral AV graft after dialysis on 12/09/2015. Upon presentation to the emergency department, the patient had postop bleeding and became hypotensive with blood pressure as low as 67/47. Compression dressing was applied, and vascular surgery was consulted. They did not feel the patient needed any urgent surgical intervention. The patient's INR was 3.80 at the time of presentation. The patient was giventranexamic acid, 1 unit of FFP and vitamin K 10 mg IV 1.  The patient was given IV fluids with improvement of her blood pressure. The patient was admitted to the stepdown unit for close observation.  Notably, the patient was diagnosed with a left common femoral vein DVT in November 2016 after which, the patient was started on warfarin. The patient has been on anticoagulation since that hospital admission. Notably, the patient had a shuntogram which revealed a patent AV graft. It was noted that her DVT was distal to her AV graft at that time.  Discharge Diagnoses:  Hemorrhagic shock/acute blood loss anemia -Secondary to bleeding Location from left AV graft -Bleeding appears to have stopped -Patient is now hemodynamically stable -Hemoglobin dropped to 11.2--> 9.3-->8.4-->8.1 -Monitor serial  hemoglobin-->stable since initial drop -Appreciate vascular surgery consult--no intervention presently  Coagulopathy -Secondary to warfarin -Presenting INR 3.80-->1.50 -Given FFP one unit, vitamin K, tranexamic acid -will not restart warfarin as pt has had one year anticoagulation for DVT in leg--discussed with Dr. Oneida Alar who agrees -no previous hx of VTE prior to Left common femoral DVT -repeat INR-->1.50  ESRD -Last dialysis 02/08/2016 -will consult nephrology if pt stays beyond next scheduled HD  Chronic Systolic and diastolic CHF -Clinically compensated -02/02/2015 echo EF 30-35%, diffuse HK, grade 2 DD, PASP 55  Coronary artery disease -No anginal symptoms  Hypertension -Hold lisinopril in the setting of hemorrhagic shock -BP rising again -restart lisinopril  Diabetes mellitus type 2  -NovoLog sliding scale  -Reduced dose Lantus during the hospitalization -resume [place] after discharge -Hemoglobin A1c--7.5  Hyperlipidemia -Continue statin  Discharge Instructions  Discharge Instructions    Diet - low sodium heart healthy    Complete by:  As directed    Increase activity slowly    Complete by:  As directed        Medication List    STOP taking these medications   glycerin adult 2 g Supp   insulin glargine 100 UNIT/ML injection Commonly known as:  LANTUS   nitroGLYCERIN 0.4 MG SL tablet Commonly known as:  NITROSTAT   warfarin 7.5 MG tablet Commonly known as:  COUMADIN     TAKE these medications   cinacalcet 30 MG tablet Commonly known as:  SENSIPAR Take 60 mg by mouth daily.   lidocaine-prilocaine cream Commonly known as:  EMLA Apply 1 application topically as needed (topical anesthesia for hemodialysis if [place]ers and Lidocaine injection are ineffective.). What changed:  when to take this   lisinopril 30 MG tablet Commonly known as:  PRINIVIL,ZESTRIL Take  30 mg by mouth every morning.   loratadine 10 MG tablet Commonly known as:   CLARITIN Take 10 mg by mouth daily.   MAPAP 325 MG tablet Generic drug:  acetaminophen Take 650 mg by mouth 2 (two) times daily.   ofloxacin 0.3 % ophthalmic solution Commonly known as:  OCUFLOX Place 1 drop into the right eye 4 (four) times daily.   oxyCODONE 5 MG immediate release tablet Commonly known as:  Oxy IR/ROXICODONE Take 5 mg by mouth 2 (two) times daily.   polyethylene glycol packet Commonly known as:  MIRALAX Use 17 gms three times daily until bowels move, with maximum of 3 consecutive days What changed:  when to take this  additional instructions   pravastatin 20 MG tablet Commonly known as:  PRAVACHOL Take 20 mg by mouth at bedtime.   PRED FORTE 1 % ophthalmic suspension Generic drug:  prednisoLONE acetate Place 1 drop into the right eye 4 (four) times daily.   rOPINIRole 0.5 MG tablet Commonly known as:  REQUIP Take 0.5 mg by mouth at bedtime.   senna-docusate 8.6-50 MG tablet Commonly known as:  Senokot-S Take 2 tablets by mouth daily as needed for mild constipation.   sevelamer carbonate 2.4 g Pack Commonly known as:  RENVELA Take 2.4 g by mouth 3 (three) times daily with meals. And 1 packet with each snack   sorbitol 70 % solution Take 30 mLs by mouth daily as needed (for constipation).   traMADol 50 MG tablet Commonly known as:  ULTRAM Take 50 mg by mouth 2 (two) times daily.   TRESIBA FLEXTOUCH 100 UNIT/ML Sopn FlexTouch Pen Generic drug:  insulin degludec Inject 8 Units into the skin at bedtime. CHECK BGL PRIOR TO DOSING   VOLTAREN 1 % Gel Generic drug:  diclofenac sodium Apply 2 g topically See admin instructions. Apply to both knees daily as needed for pain and to right hand 2-3 times a day as needed for pain (BRAND NAME VOLTAREN)   XALATAN 0.005 % ophthalmic solution Generic drug:  latanoprost Place 1 drop into the left eye at bedtime.       No Known Allergies  Consultations:  Vascular surgery   Procedures/Studies: No  results found.      Discharge Exam: Vitals:   02/10/16 0400 02/10/16 0800  BP: 138/62 (!) 144/51  Pulse: 62   Resp: 16 15  Temp: 98.2 F (36.8 C) 98 F (36.7 C)   Vitals:   02/09/16 2006 02/09/16 2343 02/10/16 0400 02/10/16 0800  BP: (!) 154/90 (!) 139/46 138/62 (!) 144/51  Pulse: 85 78 62   Resp: 19 (!) 6 16 15   Temp: 98.4 F (36.9 C) 98.4 F (36.9 C) 98.2 F (36.8 C) 98 F (36.7 C)  TempSrc: Oral Oral Oral Oral  SpO2: 100% 99% 100%   Weight:      Height:        General: Pt is alert, awake, not in acute distress Cardiovascular: RRR, S1/S2 +, no rubs, no gallops Respiratory: CTA bilaterally, no wheezing, no rhonchi Abdominal: Soft, NT, ND, bowel sounds + Extremities: no edema, no cyanosis--left leg/thigh without signs of bleeding or hematoma; palpable thrill L-fem   The results of significant diagnostics from this hospitalization (including imaging, microbiology, ancillary and laboratory) are listed below for reference.    Significant Diagnostic Studies: No results found.   Microbiology: Recent Results (from the past 240 hour(s))  MRSA PCR Screening     Status: None   Collection Time: 02/08/16 11:30  PM  Result Value Ref Range Status   MRSA by PCR NEGATIVE NEGATIVE Final    Comment:        The GeneXpert MRSA Assay (FDA approved for NASAL specimens only), is one component of a comprehensive MRSA colonization surveillance program. It is not intended to diagnose MRSA infection nor to guide or monitor treatment for MRSA infections.      Labs: Basic Metabolic Panel:  Recent Labs Lab 02/08/16 1756 02/09/16 0925 02/10/16 0203  NA 137 137 137  K 4.4 5.1 5.1  CL 95* 97* 97*  CO2 29 30 30   GLUCOSE 105* 127* 105*  BUN 14 23* 34*  CREATININE 3.49* 5.11* 6.58*  CALCIUM 8.2* 8.2* 8.4*   Liver Function Tests:  Recent Labs Lab 02/09/16 0925  AST 19  ALT 13*  ALKPHOS 55  BILITOT 0.6  PROT 5.5*  ALBUMIN 2.9*   No results for input(s): LIPASE,  AMYLASE in the last 168 hours. No results for input(s): AMMONIA in the last 168 hours. CBC:  Recent Labs Lab 02/08/16 1756 02/08/16 2034 02/09/16 0925 02/10/16 0203  WBC 3.3* 3.8* 3.1* 3.1*  NEUTROABS 1.9  --   --   --   HGB 11.2* 9.3* 8.4* 8.1*  HCT 34.5* 29.1* 26.4* 24.9*  MCV 95.8 96.0 96.7 96.5  PLT 122* 122* 115* 110*   Cardiac Enzymes: No results for input(s): CKTOTAL, CKMB, CKMBINDEX, TROPONINI in the last 168 hours. BNP: Invalid input(s): POCBNP CBG:  Recent Labs Lab 02/09/16 1242 02/09/16 1305 02/09/16 1711 02/09/16 2049 02/10/16 0746  GLUCAP 55* 75 110* 189* 70    Time coordinating discharge:  Greater than 30 minutes  Signed:  Shealee Yordy, DO [facility] Pager: [phone] 02/10/2016, 11:05 AM

## 2016-02-09 NOTE — Progress Notes (Signed)
Blood sugar 52.apple juice given with lunch tray. Repeat blood sugar 75.

## 2016-02-09 NOTE — Consult Note (Signed)
[facility]  Subjective  - no complaints   Objective (!) 144/58 77 98.4 F (36.9 C) (Oral) 13 96%  Intake/Output Summary (Last 24 hours) at 02/09/16 1015 Last data filed at 02/09/16 0500  Gross per 24 hour  Intake              250 ml  Output                0 ml  Net              250 ml   Left thigh graft pulsatile, no bleeding no eschar no obvious needle hole  Assessment/Planning: Bleeding left thigh graft probably related to coagulapathy Does she still need coumadin? Will schedule elective thigh graft shuntogram as outpt to exclude venous hypertension  Ruta Hinds 02/09/2016 10:15 AM --  Laboratory Lab Results:  Recent Labs  02/08/16 1756 02/08/16 2034  WBC 3.3* 3.8*  HGB 11.2* 9.3*  HCT 34.5* 29.1*  PLT 122* 122*   BMET  Recent Labs  02/08/16 1756  NA 137  K 4.4  CL 95*  CO2 29  GLUCOSE 105*  BUN 14  CREATININE 3.49*  CALCIUM 8.2*    COAG Lab Results  Component Value Date   INR 3.80 02/08/2016   INR 4.85 (H) 09/03/2015   INR 2.20 (H) 04/19/2015   No results found for: PTT

## 2016-02-10 LAB — GLUCOSE, CAPILLARY
GLUCOSE-CAPILLARY: 70 mg/dL (ref 65–99)
Glucose-Capillary: 173 mg/dL — ABNORMAL HIGH (ref 65–99)

## 2016-02-10 LAB — PREPARE FRESH FROZEN PLASMA: UNIT DIVISION: 0

## 2016-02-10 LAB — BASIC METABOLIC PANEL
ANION GAP: 10 (ref 5–15)
BUN: 34 mg/dL — ABNORMAL HIGH (ref 6–20)
CALCIUM: 8.4 mg/dL — AB (ref 8.9–10.3)
CO2: 30 mmol/L (ref 22–32)
CREATININE: 6.58 mg/dL — AB (ref 0.44–1.00)
Chloride: 97 mmol/L — ABNORMAL LOW (ref 101–111)
GFR, EST AFRICAN AMERICAN: 7 mL/min — AB (ref >60)
GFR, EST NON AFRICAN AMERICAN: 6 mL/min — AB (ref >60)
Glucose, Bld: 105 mg/dL — ABNORMAL HIGH (ref 65–99)
Potassium: 5.1 mmol/L (ref 3.5–5.1)
SODIUM: 137 mmol/L (ref 135–145)

## 2016-02-10 LAB — CBC
HCT: 24.9 % — ABNORMAL LOW (ref 36.0–46.0)
Hemoglobin: 8.1 g/dL — ABNORMAL LOW (ref 12.0–15.0)
MCH: 31.4 pg (ref 26.0–34.0)
MCHC: 32.5 g/dL (ref 30.0–36.0)
MCV: 96.5 fL (ref 78.0–100.0)
PLATELETS: 110 10*3/uL — AB (ref 150–400)
RBC: 2.58 MIL/uL — ABNORMAL LOW (ref 3.87–5.11)
RDW: 16.1 % — AB (ref 11.5–15.5)
WBC: 3.1 10*3/uL — AB (ref 4.0–10.5)

## 2016-02-10 LAB — HEMOGLOBIN A1C
HEMOGLOBIN A1C: 7.5 % — AB (ref 4.8–5.6)
Mean Plasma Glucose: 169 mg/dL

## 2016-02-10 NOTE — Progress Notes (Signed)
Ants found in pt's belongings contained to her lunch box and small insulated carry sack. Noted that pt had small packets of jelly, Ketchup, and other condiments. Pt's belongings cleaned Pt said it was OK to throw those sticky items out. Clothes all shook out and were clean. Daughter and facility where pt lives [facility])  were notified.

## 2016-02-10 NOTE — Progress Notes (Signed)
Pt discharged per [facility] with all personal belongings including cell phone and both upper and lower dentures Daughter Solmon Ice is aware of pt's return to her assisted living. Observed all her personal belongings no more ants were observed.

## 2016-02-10 NOTE — Progress Notes (Signed)
Went over d/c notes with pt but lives in assisted living aware she is going home today She does not take her own meds or have to cook & they help her with bathing and dressing.

## 2016-02-10 NOTE — NC FL2 (Addendum)
[place] MEDICAID FL2 LEVEL OF CARE SCREENING TOOL     IDENTIFICATION  Patient Name: Stefanie [name] Birthdate: 02/25/1946 Sex: female Admission Date (Current Location): 02/08/2016  [facility] and [place] Number:  Herbalist and Address:  [facility], [place] [address] [place], [place] [address]      Provider Number: [id number]  Attending Physician Name and Address:  Orson Eva, MD  Relative Name and Phone Number:       Current Level of Care: Hospital Recommended Level of Care: ALF Prior Approval Number:    Date Approved/Denied:   PASRR Number:    Discharge Plan: Other (Comment) (ALF)    Current Diagnoses: Patient Active Problem List   Diagnosis Date Noted  . Acute blood loss anemia 02/09/2016  . Hypotension due to blood loss 02/09/2016  . Chronic combined systolic and diastolic CHF (congestive heart failure) ([place]) 02/09/2016  . Hemorrhage 02/08/2016  . Anemia 02/08/2016  . Left leg pain 04/10/2015  . Bacteremia 04/10/2015  . Lactic acidosis   . Cellulitis 04/09/2015  . History of CVA with residual deficit 02/11/2015  . Debility 02/08/2015  . DVT (deep venous thrombosis) ([place])   . CHF (congestive heart failure) ([place])   . Chronic diastolic congestive heart failure ([place])   . ESRD (end stage renal disease) ([place])   . Congestive dilated cardiomyopathy ([place])   . Left leg DVT ([place]) 02/01/2015  . Leukocytosis 02/01/2015  . Elevated troponin 02/01/2015  . SOB (shortness of breath) 02/01/2015  . Type 2 diabetes, controlled, with neuropathy ([place]) 12/20/2014  . Pain due to onychomycosis of toenail 12/20/2014  . 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 Mechanical complication of other vascular device, implant, and graft 05/19/2013  . PAD (peripheral artery disease) ([place]) 02/24/2012  . Murmur 12/24/2011  . Bradycardia-intermittent sinus 07/10/2010  . DYSPNEA ON EXERTION 02/25/2010  . CHEST PAIN, EXERTIONAL 02/25/2010  . DM 02/24/2010  .  PROTEIN MALNUTRITION 02/24/2010  . HLD (hyperlipidemia) 02/24/2010  . ANEMIA 02/24/2010  . Essential hypertension 02/24/2010  . Cerebral artery occlusion with cerebral infarction ([place]) 02/24/2010  . ESRD on dialysis ([place]) 02/24/2010  . Secondary renal hyperparathyroidism ([place]) 02/24/2010    Orientation RESPIRATION BLADDER Height & Weight     Self, Place  Normal Continent Weight: 159 lb 2.8 oz (72.2 kg) Height:  5\' 4"  (162.6 cm)  BEHAVIORAL SYMPTOMS/MOOD NEUROLOGICAL BOWEL NUTRITION STATUS      Continent regular  AMBULATORY STATUS COMMUNICATION OF NEEDS Skin   Extensive Assist Verbally Normal                       Personal Care Assistance Level of Assistance  Bathing, Dressing Bathing Assistance: Limited assistance   Dressing Assistance: Limited assistance     Functional Limitations Info             SPECIAL CARE FACTORS FREQUENCY  PT (By licensed PT), OT (By licensed OT)     PT Frequency: 3/wk with home health OT Frequency: 3/wk with home health            Contractures      Additional Factors Info  Code Status, Allergies Code Status Info: DNR Allergies Info: NKA            Discharge Medications: Medication List    STOP taking these medications   glycerin adult 2 g Supp  insulin glargine 100 UNIT/ML injection Commonly known as:  LANTUS  nitroGLYCERIN 0.4 MG SL tablet  Commonly known as:  NITROSTAT  warfarin 7.5 MG tablet Commonly known as:  COUMADIN    TAKE these medications   cinacalcet 30 MG tablet Commonly known as:  SENSIPAR Take 60 mg by mouth daily.  lidocaine-prilocaine cream Commonly known as:  EMLA Apply 1 application topically as needed (topical anesthesia for hemodialysis if Gebauers and Lidocaine injection are ineffective.). What changed:  when to take this  lisinopril 30 MG tablet Commonly known as:  PRINIVIL,ZESTRIL Take 30 mg by mouth every morning.  loratadine 10 MG tablet Commonly known as:  CLARITIN Take 10 mg by  mouth daily.  MAPAP 325 MG tablet Generic drug:  acetaminophen Take 650 mg by mouth 2 (two) times daily.  ofloxacin 0.3 % ophthalmic solution Commonly known as:  OCUFLOX Place 1 drop into the right eye 4 (four) times daily.  oxyCODONE 5 MG immediate release tablet Commonly known as:  Oxy IR/ROXICODONE Take 5 mg by mouth 2 (two) times daily.  polyethylene glycol packet Commonly known as:  MIRALAX Use 17 gms three times daily until bowels move, with maximum of 3 consecutive days What changed:  when to take this  additional instructions  pravastatin 20 MG tablet Commonly known as:  PRAVACHOL Take 20 mg by mouth at bedtime.  PRED FORTE 1 % ophthalmic suspension Generic drug:  prednisoLONE acetate Place 1 drop into the right eye 4 (four) times daily.  rOPINIRole 0.5 MG tablet Commonly known as:  REQUIP Take 0.5 mg by mouth at bedtime.  senna-docusate 8.6-50 MG tablet Commonly known as:  Senokot-S Take 2 tablets by mouth daily as needed for mild constipation.  sevelamer carbonate 2.4 g Pack Commonly known as:  RENVELA Take 2.4 g by mouth 3 (three) times daily with meals. And 1 packet with each snack  sorbitol 70 % solution Take 30 mLs by mouth daily as needed (for constipation).  traMADol 50 MG tablet Commonly known as:  ULTRAM Take 50 mg by mouth 2 (two) times daily.  TRESIBA FLEXTOUCH 100 UNIT/ML Sopn FlexTouch Pen Generic drug:  insulin degludec Inject 8 Units into the skin at bedtime. CHECK BGL PRIOR TO DOSING  VOLTAREN 1 % Gel Generic drug:  diclofenac sodium Apply 2 g topically See admin instructions. Apply to both knees daily as needed for pain and to right hand 2-3 times a day as needed for pain (BRAND NAME VOLTAREN)  XALATAN 0.005 % ophthalmic solution Generic drug:  latanoprost Place 1 drop into the left eye at bedtime.       Relevant Imaging Results:  Relevant Lab Results:   Additional Information ss# [id number]  Jorge Ny, [place]

## 2016-02-10 NOTE — Progress Notes (Signed)
CSW informed pt is from [facility] and is ready to return to facility today- CSW confirmed this plan with patient daughter Stefanie [name] and confirmed pt ok to return with facility (DC and fl2 sent to facility)  Patient will discharge to [facility] Anticipated discharge date: 11/27 Family notified: Stefanie [name] Transportation by [employer]- called at 2:25pm  [place] signing off.  Jorge Ny, LCSW Clinical Social Worker [phone]

## 2016-02-10 NOTE — Evaluation (Signed)
Physical Therapy Evaluation Patient Details Name: Stefanie [name] MRN: [id number] DOB: [birth year] Today's Date: 02/10/2016   History of Present Illness  70 y.o. female, With history of ESRD on dialysis with left femoral AV fistula, CAD, anemia, came to the ED from dialysis center for uncontrolled bleeding from left femoral AV fistula after dialysis  Clinical Impression  Patient demonstrates deficits in functional mobility as indicated below. Will need continued skilled PT to address deficits and maximize function. Will see as indicated and progress as tolerated. Recommend return to facility with assist up acute discharge.    Follow Up Recommendations SNF;Supervision/Assistance - 24 hour (return to facility)    Equipment Recommendations  None recommended by PT    Recommendations for Other Services       Precautions / Restrictions Precautions Precautions: Fall Restrictions Weight Bearing Restrictions: No      Mobility  Bed Mobility Overal bed mobility: Modified Independent             General bed mobility comments: increased time to perform due to RLE knee pain but no physical assist required. use of rail to come to EOB  Transfers Overall transfer level: Needs assistance   Transfers: Sit to/from [employer] Transfers Sit to Stand: Min guard   Squat pivot transfers: Min guard     General transfer comment: min guard for safety  Ambulation/Gait             General Gait Details: pt reports non ambulatory at baseline  Stairs            Wheelchair Mobility    Modified Rankin (Stroke Patients Only)       Balance Overall balance assessment: Needs assistance   Sitting balance-Leahy Scale: Fair Sitting balance - Comments: able to sit EOB without difficulty                                     Pertinent Vitals/Pain Pain Assessment: Faces Faces Pain Scale: Hurts even more Pain Location: right knee Pain Descriptors /  Indicators: Grimacing;Guarding;Sore Pain Intervention(s): Limited activity within patient's tolerance;Repositioned    Home Living Family/patient expects to be discharged to:: Skilled nursing facility Living Arrangements: Alone Available Help at Discharge: [facility] Type of Home: Assisted living Home Access: [place]: One level Home Equipment: Environmental consultant - 4 wheels;Shower seat;Wheelchair - manual      Prior Function Level of Independence: Needs assistance   Gait / Transfers Assistance Needed: uses wheel chair           Hand Dominance   Dominant Hand: Right    Extremity/Trunk Assessment   Upper Extremity Assessment: Generalized weakness;Defer to OT evaluation           Lower Extremity Assessment: Generalized weakness;RLE deficits/detail RLE Deficits / Details: Patient with extremely painful Right knee, residual wkness from CVA hx    Cervical / Trunk Assessment: Kyphotic  Communication   Communication: No difficulties  Cognition Arousal/Alertness: Awake/alert Behavior During Therapy: WFL for tasks assessed/performed Overall Cognitive Status: No family/caregiver present to determine baseline cognitive functioning                      General Comments      Exercises     Assessment/Plan    PT Assessment Patient needs continued PT services  PT Problem List Decreased strength;Decreased activity tolerance;Pain  PT Treatment Interventions Functional mobility training;Therapeutic activities;Therapeutic exercise;Balance training;Patient/family education    PT Goals (Current goals can be found in the Care Plan section)  Acute Rehab PT Goals Patient Stated Goal: to go home PT Goal Formulation: With patient Time For Goal Achievement: 02/24/16 Potential to Achieve Goals: Fair    Frequency Min 2X/week   Barriers to discharge        Co-evaluation               End of Session Equipment Utilized During  Treatment: Gait belt Activity Tolerance: Patient limited by pain Patient left: in chair;with call bell/phone within reach (no chair alarm available) Nurse Communication: Mobility status (no chair alarm)         Time: 2979-8921 PT Time Calculation (min) (ACUTE ONLY): 17 min   Charges:   PT Evaluation $PT Eval Moderate Complexity: 1 Procedure     PT G Codes:        Duncan Dull 02/24/16, 1:11 PM Alben Deeds, [place] DPT  [phone]

## 2016-02-11 DIAGNOSIS — E1129 Type 2 diabetes mellitus with other diabetic kidney complication: Secondary | ICD-10-CM | POA: Diagnosis not present

## 2016-02-11 DIAGNOSIS — D509 Iron deficiency anemia, unspecified: Secondary | ICD-10-CM | POA: Diagnosis not present

## 2016-02-11 DIAGNOSIS — N186 End stage renal disease: Secondary | ICD-10-CM | POA: Diagnosis not present

## 2016-02-11 DIAGNOSIS — D500 Iron deficiency anemia secondary to blood loss (chronic): Secondary | ICD-10-CM | POA: Diagnosis not present

## 2016-02-11 DIAGNOSIS — N2581 Secondary hyperparathyroidism of renal origin: Secondary | ICD-10-CM | POA: Diagnosis not present

## 2016-02-12 DIAGNOSIS — Z7901 Long term (current) use of anticoagulants: Secondary | ICD-10-CM | POA: Diagnosis not present

## 2016-02-12 LAB — TYPE AND SCREEN
ABO/RH(D): O POS
ANTIBODY SCREEN: NEGATIVE
Unit division: 0
Unit division: 0

## 2016-02-13 DIAGNOSIS — D509 Iron deficiency anemia, unspecified: Secondary | ICD-10-CM | POA: Diagnosis not present

## 2016-02-13 DIAGNOSIS — N186 End stage renal disease: Secondary | ICD-10-CM | POA: Diagnosis not present

## 2016-02-13 DIAGNOSIS — D500 Iron deficiency anemia secondary to blood loss (chronic): Secondary | ICD-10-CM | POA: Diagnosis not present

## 2016-02-13 DIAGNOSIS — Z992 Dependence on renal dialysis: Secondary | ICD-10-CM | POA: Diagnosis not present

## 2016-02-13 DIAGNOSIS — E1129 Type 2 diabetes mellitus with other diabetic kidney complication: Secondary | ICD-10-CM | POA: Diagnosis not present

## 2016-02-13 DIAGNOSIS — N2581 Secondary hyperparathyroidism of renal origin: Secondary | ICD-10-CM | POA: Diagnosis not present

## 2016-02-14 ENCOUNTER — Other Ambulatory Visit: Payer: Self-pay

## 2016-02-15 DIAGNOSIS — E1129 Type 2 diabetes mellitus with other diabetic kidney complication: Secondary | ICD-10-CM | POA: Diagnosis not present

## 2016-02-15 DIAGNOSIS — D509 Iron deficiency anemia, unspecified: Secondary | ICD-10-CM | POA: Diagnosis not present

## 2016-02-15 DIAGNOSIS — N186 End stage renal disease: Secondary | ICD-10-CM | POA: Diagnosis not present

## 2016-02-15 DIAGNOSIS — N2581 Secondary hyperparathyroidism of renal origin: Secondary | ICD-10-CM | POA: Diagnosis not present

## 2016-02-15 DIAGNOSIS — D500 Iron deficiency anemia secondary to blood loss (chronic): Secondary | ICD-10-CM | POA: Diagnosis not present

## 2016-02-19 DIAGNOSIS — G5601 Carpal tunnel syndrome, right upper limb: Secondary | ICD-10-CM | POA: Diagnosis not present

## 2016-02-19 DIAGNOSIS — M65311 Trigger thumb, right thumb: Secondary | ICD-10-CM | POA: Diagnosis not present

## 2016-02-19 DIAGNOSIS — M65331 Trigger finger, right middle finger: Secondary | ICD-10-CM | POA: Diagnosis not present

## 2016-02-19 DIAGNOSIS — M79641 Pain in right hand: Secondary | ICD-10-CM | POA: Diagnosis not present

## 2016-02-21 ENCOUNTER — Ambulatory Visit (HOSPITAL_COMMUNITY)
Admission: RE | Admit: 2016-02-21 | Discharge: 2016-02-21 | Disposition: A | Discharge status: Home / Self Care | Payer: Medicare Other | Source: Ambulatory Visit | Attending: Vascular Surgery | Admitting: Vascular Surgery

## 2016-02-21 ENCOUNTER — Encounter (HOSPITAL_COMMUNITY)
Admission: RE | Disposition: A | Discharge status: Home / Self Care | Payer: Self-pay | Source: Ambulatory Visit | Attending: Vascular Surgery

## 2016-02-21 DIAGNOSIS — Z5309 Procedure and treatment not carried out because of other contraindication: Secondary | ICD-10-CM | POA: Diagnosis not present

## 2016-02-21 DIAGNOSIS — X58XXXA Exposure to other specified factors, initial encounter: Secondary | ICD-10-CM | POA: Insufficient documentation

## 2016-02-21 DIAGNOSIS — T82838A Hemorrhage of vascular prosthetic devices, implants and grafts, initial encounter: Secondary | ICD-10-CM | POA: Diagnosis not present

## 2016-02-21 LAB — POCT I-STAT, CHEM 8
BUN: 20 mg/dL (ref 6–20)
CALCIUM ION: 0.75 mmol/L — AB (ref 1.15–1.40)
CHLORIDE: 101 mmol/L (ref 101–111)
Creatinine, Ser: 4.5 mg/dL — ABNORMAL HIGH (ref 0.44–1.00)
GLUCOSE: 124 mg/dL — AB (ref 65–99)
HCT: 26 % — ABNORMAL LOW (ref 36.0–46.0)
Hemoglobin: 8.8 g/dL — ABNORMAL LOW (ref 12.0–15.0)
Potassium: 4.7 mmol/L (ref 3.5–5.1)
Sodium: 134 mmol/L — ABNORMAL LOW (ref 135–145)
TCO2: 24 mmol/L (ref 0–100)

## 2016-02-21 LAB — PROTIME-INR
INR: 2.7
Prothrombin Time: 29.2 seconds — ABNORMAL HIGH (ref 11.4–15.2)

## 2016-02-21 SURGERY — INVASIVE LAB ABORTED CASE
Laterality: Left

## 2016-02-21 MED ORDER — SODIUM CHLORIDE 0.9% FLUSH: 3.0000 mL | INTRAVENOUS | Status: DC | PRN

## 2016-02-21 SURGICAL SUPPLY — 9 items
BAG SNAP BAND KOVER 36X36 (MISCELLANEOUS) IMPLANT
COVER DOME SNAP 22 D (MISCELLANEOUS) IMPLANT
COVER PRB 48X5XTLSCP FOLD TPE (BAG) IMPLANT
COVER PROBE 5X48 (BAG)
PROTECTION STATION PRESSURIZED (MISCELLANEOUS)
STATION PROTECTION PRESSURIZED (MISCELLANEOUS) IMPLANT
STOPCOCK MORSE 400PSI 3WAY (MISCELLANEOUS) IMPLANT
TRAY PV CATH (CUSTOM PROCEDURE TRAY) IMPLANT
TUBING CIL FLEX 10 FLL-RA (TUBING) IMPLANT

## 2016-02-21 NOTE — H&P (View-Only) (Signed)
[facility] [place]  Subjective  - no complaints   Objective (!) 144/58 77 98.4 F (36.9 C) (Oral) 13 96%  Intake/Output Summary (Last 24 hours) at 02/09/16 1015 Last data filed at 02/09/16 0500  Gross per 24 hour  Intake              250 ml  Output                0 ml  Net              250 ml   Left thigh graft pulsatile, no bleeding no eschar no obvious needle hole  Assessment/Planning: Bleeding left thigh graft probably related to coagulapathy Does Stefanie [name] still need coumadin? Will schedule elective thigh graft shuntogram as outpt to exclude venous hypertension  Stefanie [name] 02/09/2016 10:15 AM --  Laboratory Lab Results:  Recent Labs  02/08/16 1756 02/08/16 2034  WBC 3.3* 3.8*  HGB 11.2* 9.3*  HCT 34.5* 29.1*  PLT 122* 122*   BMET  Recent Labs  02/08/16 1756  NA 137  K 4.4  CL 95*  CO2 29  GLUCOSE 105*  BUN 14  CREATININE 3.49*  CALCIUM 8.2*    COAG Lab Results  Component Value Date   INR 3.80 02/08/2016   INR 4.85 (H) 09/03/2015   INR 2.20 (H) 04/19/2015   No results found for: PTT

## 2016-02-21 NOTE — Interval H&P Note (Signed)
History and Physical Interval Note:  02/21/2016 9:41 AM  [name] Coletti  has presented today for surgery, with the diagnosis of bleeding from thigh grawft post dialysis  The various methods of treatment have been discussed with the patient and family. After consideration of risks, benefits and other options for treatment, the patient has consented to  Procedure(s): A/V Shuntogram (Left) as a surgical intervention .  The patient's history has been reviewed, patient examined, no change in status, stable for surgery.  I have reviewed the patient's chart and labs.  Questions were answered to the patient's satisfaction.     Ruta Hinds

## 2016-02-21 NOTE — Progress Notes (Signed)
Pt INR 2.7 today.  Procedure cancelled.  Pt was to permanently d/c coumadin on last d/c from hospital but apparently this did not happen.  Will reschedule in the near future.  D/c coumadin as this was for DVT and she has been treated greater than one year  Stefanie [name]

## 2016-02-24 ENCOUNTER — Other Ambulatory Visit: Payer: Self-pay

## 2016-02-24 DIAGNOSIS — I69954 Hemiplegia and hemiparesis following unspecified cerebrovascular disease affecting left non-dominant side: Secondary | ICD-10-CM | POA: Diagnosis not present

## 2016-02-24 DIAGNOSIS — H409 Unspecified glaucoma: Secondary | ICD-10-CM | POA: Diagnosis not present

## 2016-02-24 DIAGNOSIS — I509 Heart failure, unspecified: Secondary | ICD-10-CM | POA: Diagnosis not present

## 2016-02-24 DIAGNOSIS — N186 End stage renal disease: Secondary | ICD-10-CM | POA: Diagnosis not present

## 2016-02-24 DIAGNOSIS — M1990 Unspecified osteoarthritis, unspecified site: Secondary | ICD-10-CM | POA: Diagnosis not present

## 2016-02-24 DIAGNOSIS — Z86718 Personal history of other venous thrombosis and embolism: Secondary | ICD-10-CM | POA: Diagnosis not present

## 2016-02-24 DIAGNOSIS — M25569 Pain in unspecified knee: Secondary | ICD-10-CM | POA: Diagnosis not present

## 2016-02-24 DIAGNOSIS — I10 Essential (primary) hypertension: Secondary | ICD-10-CM | POA: Diagnosis not present

## 2016-02-26 ENCOUNTER — Encounter (HOSPITAL_COMMUNITY): Payer: Self-pay | Admitting: Vascular Surgery

## 2016-02-26 ENCOUNTER — Ambulatory Visit (HOSPITAL_COMMUNITY)
Admission: RE | Admit: 2016-02-26 | Discharge: 2016-02-26 | Disposition: A | Discharge status: Home / Self Care | Payer: Medicare Other | Source: Ambulatory Visit | Attending: Vascular Surgery | Admitting: Vascular Surgery

## 2016-02-26 ENCOUNTER — Encounter (HOSPITAL_COMMUNITY)
Admission: RE | Disposition: A | Discharge status: Home / Self Care | Payer: Self-pay | Source: Ambulatory Visit | Attending: Vascular Surgery

## 2016-02-26 DIAGNOSIS — Y832 Surgical operation with anastomosis, bypass or graft as the cause of abnormal reaction of the patient, or of later complication, without mention of misadventure at the time of the procedure: Secondary | ICD-10-CM | POA: Diagnosis not present

## 2016-02-26 DIAGNOSIS — T82898A Other specified complication of vascular prosthetic devices, implants and grafts, initial encounter: Secondary | ICD-10-CM | POA: Diagnosis not present

## 2016-02-26 DIAGNOSIS — N186 End stage renal disease: Secondary | ICD-10-CM | POA: Insufficient documentation

## 2016-02-26 DIAGNOSIS — N185 Chronic kidney disease, stage 5: Secondary | ICD-10-CM | POA: Diagnosis not present

## 2016-02-26 DIAGNOSIS — Z992 Dependence on renal dialysis: Secondary | ICD-10-CM | POA: Diagnosis not present

## 2016-02-26 DIAGNOSIS — T82858A Stenosis of vascular prosthetic devices, implants and grafts, initial encounter: Secondary | ICD-10-CM | POA: Diagnosis not present

## 2016-02-26 HISTORY — PX: PERIPHERAL VASCULAR CATHETERIZATION: SHX172C

## 2016-02-26 LAB — PROTIME-INR
INR: 1.16
PROTHROMBIN TIME: 14.8 s (ref 11.4–15.2)

## 2016-02-26 LAB — POCT I-STAT, CHEM 8
BUN: 22 mg/dL — ABNORMAL HIGH (ref 6–20)
CREATININE: 4.7 mg/dL — AB (ref 0.44–1.00)
Calcium, Ion: 1.11 mmol/L — ABNORMAL LOW (ref 1.15–1.40)
Chloride: 93 mmol/L — ABNORMAL LOW (ref 101–111)
GLUCOSE: 121 mg/dL — AB (ref 65–99)
HCT: 32 % — ABNORMAL LOW (ref 36.0–46.0)
HEMOGLOBIN: 10.9 g/dL — AB (ref 12.0–15.0)
POTASSIUM: 4.6 mmol/L (ref 3.5–5.1)
Sodium: 137 mmol/L (ref 135–145)
TCO2: 33 mmol/L (ref 0–100)

## 2016-02-26 LAB — GLUCOSE, CAPILLARY
Glucose-Capillary: 117 mg/dL — ABNORMAL HIGH (ref 65–99)
Glucose-Capillary: 107 mg/dL — ABNORMAL HIGH (ref 65–99)

## 2016-02-26 SURGERY — A/V SHUNTOGRAM
Anesthesia: LOCAL | Site: Thigh | Laterality: Left

## 2016-02-26 MED ORDER — HEPARIN (PORCINE) IN NACL 2-0.9 UNIT/ML-% IJ SOLN
INTRAMUSCULAR | Status: DC | PRN
  Administered 2016-02-26: 500 mL

## 2016-02-26 MED ORDER — LIDOCAINE HCL (PF) 1 % IJ SOLN
INTRAMUSCULAR | Status: DC | PRN
  Administered 2016-02-26: 4 mL

## 2016-02-26 MED ORDER — IODIXANOL 320 MG/ML IV SOLN
INTRAVENOUS | Status: DC | PRN
  Administered 2016-02-26: 35 mL

## 2016-02-26 MED ORDER — LIDOCAINE HCL (PF) 1 % IJ SOLN
INTRAMUSCULAR | Status: AC
  Filled 2016-02-26: qty 30

## 2016-02-26 MED ORDER — HEPARIN (PORCINE) IN NACL 2-0.9 UNIT/ML-% IJ SOLN
INTRAMUSCULAR | Status: AC
  Filled 2016-02-26: qty 500

## 2016-02-26 MED ORDER — SODIUM CHLORIDE 0.9% FLUSH: 3.0000 mL | INTRAVENOUS | Status: DC | PRN

## 2016-02-26 SURGICAL SUPPLY — 15 items
BAG SNAP BAND KOVER 36X36 (MISCELLANEOUS) ×3 IMPLANT
BALLN ARMADA 5X80X80 (BALLOONS) ×3
BALLOON ARMADA 5X80X80 (BALLOONS) ×2 IMPLANT
COVER DOME SNAP 22 D (MISCELLANEOUS) ×3 IMPLANT
COVER PRB 48X5XTLSCP FOLD TPE (BAG) ×2 IMPLANT
COVER PROBE 5X48 (BAG) ×1
KIT ENCORE 26 ADVANTAGE (KITS) ×3 IMPLANT
KIT MICROINTRODUCER STIFF 5F (SHEATH) ×3 IMPLANT
PROTECTION STATION PRESSURIZED (MISCELLANEOUS) ×3
SHEATH PINNACLE R/O II 6F 4CM (SHEATH) ×3 IMPLANT
STATION PROTECTION PRESSURIZED (MISCELLANEOUS) ×2 IMPLANT
STOPCOCK MORSE 400PSI 3WAY (MISCELLANEOUS) ×3 IMPLANT
TRAY PV CATH (CUSTOM PROCEDURE TRAY) ×3 IMPLANT
TUBING CIL FLEX 10 FLL-RA (TUBING) ×3 IMPLANT
WIRE BENTSON .035X145CM (WIRE) ×3 IMPLANT

## 2016-02-26 NOTE — Discharge Instructions (Signed)
Fistulogram, Care After °Introduction °Refer to this sheet in the next few weeks. These instructions provide you with information on caring for yourself after your procedure. Your health care provider may also give you more specific instructions. Your treatment has been planned according to current medical practices, but problems sometimes occur. Call your health care provider if you have any problems or questions after your procedure. °What can I expect after the procedure? °After your procedure, it is typical to have the following: °· A small amount of discomfort in the area where the catheters were placed. °· A small amount of bruising around the fistula. °· Sleepiness and fatigue. °Follow these instructions at home: °· Rest at home for the day following your procedure. °· Do not drive or operate heavy machinery while taking pain medicine. °· Take medicines only as directed by your health care provider. °· Do not take baths, swim, or use a hot tub until your health care provider approves. You may shower 24 hours after the procedure or as directed by your health care provider. °· There are many different ways to close and cover an incision, including stitches, skin glue, and adhesive strips. Follow your health care provider's instructions on: °¨ Incision care. °¨ Bandage (dressing) changes and removal. °¨ Incision closure removal. °· Monitor your dialysis fistula carefully. °Contact a health care provider if: °· You have drainage, redness, swelling, or pain at your catheter site. °· You have a fever. °· You have chills. °Get help right away if: °· You feel weak. °· You have trouble balancing. °· You have trouble moving your arms or legs. °· You have problems with your speech or vision. °· You can no longer feel a vibration or buzz when you put your fingers over your dialysis fistula. °· The limb that was used for the procedure: °¨ Swells. °¨ Is painful. °¨ Is cold. °¨ Is discolored, such as blue or pale  white. °This information is not intended to replace advice given to you by your health care provider. Make sure you discuss any questions you have with your health care provider. °Document Released: 07/17/2013 Document Revised: 08/08/2015 Document Reviewed: 04/21/2013 °© 2017 Elsevier ° °

## 2016-02-26 NOTE — H&P (Signed)
     History and Physical Update  The patient was interviewed and re-examined.  The patient's previous History and Physical has been reviewed and is unchanged from previous evaluation. Left thigh fistulogram with likely intervention. She is off coumadin.   Jazzalynn Rhudy C. Donzetta Matters, MD [facility] [place] Office: [phone] Pager: [phone]   02/26/2016, 10:03 AM

## 2016-02-26 NOTE — Op Note (Signed)
    Patient name: Stefanie [name] MRN: [id number] DOB: [birth year] Sex: female  02/26/2016 Pre-operative Diagnosis: esrd, increased dialysis time left thigh av graft Post-operative diagnosis:  Same Surgeon:  Eda Paschal. Donzetta Matters, MD Procedure Performed: 1.  US guided cannulation left thigh av graft 2.  Shuntogram with central runoff 3.  Balloon venoplasty of left common femoral vein and venous anastomosis with 62mm balloon  Indications:  70 year old female with end-stage renal disease on dialysis via left thigh AV graft. She has known stenting of her left common external iliac veins as well as left common femoral vein on the left. She recently has increased dialysis times and bleeding on dialysis with cannulation. She is therefore indicated for shuntogram.  Findings: On initial entry there was a micropuncture wire fracture which remained in the soft tissue. Shuntogram demonstrated tight stenosis at the venous anastomosis that was improved with 5 mm balloon and brisk runoff post intervention. There did not appear to be any arterial stenoses.   Procedure:  The patient was identified in the holding area and taken to room 8.  The patient was then placed supine on the table and prepped and draped in the usual sterile fashion.  A time out was called.  Ultrasound was used to evaluate the left leg AV graft. Micropuncture needle was used to cannulate the graft and micropuncture wire was attempted to pass. Unfortunately this would not pass initially and the wire and needle were attempted to remove together. Unfortunately they would not pull back together so the needle itself was removed. We tented to pass the micropuncture sheath over the wire and this also would not work. Ultimately excess pressure was needed and the wire to fracture. I cannot feel in the soft tissue we could see it under fluoroscopy. We then used ultrasound to cannulate with a separate micropuncture needle and placed a wire that now passed easily.  This is exchanged for micropuncture sheath and the above noted venogram was performed. We then placed a Bentson wire and exchanged for 6 French sheath. A 5 mm balloon was brought to the area of stenosis and inflated. Retrograde shuntogram demonstrated the above-noted findings without any arterial stenosis. At this time the fracture wire was clearly within the soft tissue without any limitation of flow. The balloon was deflated and completion venogram demonstrated brisk runoff improvement of the stenosis. Stents due to extensive common femoral vein on the left side of further stenting should be performed as this would likely jail profunda vein on the left. With this wire was removed for Monocryl was used to close the stick site and the sheath removed and pressure held. Patient tolerated this procedure well.  Contrast: 35  Complications: Wire fracture with residual wire in left thigh soft tissue.   Brandon C. Donzetta Matters, MD [facility] [place] Office: [phone] Pager: [phone]

## 2016-03-04 DIAGNOSIS — Z7901 Long term (current) use of anticoagulants: Secondary | ICD-10-CM | POA: Diagnosis not present

## 2016-03-11 DIAGNOSIS — Z7901 Long term (current) use of anticoagulants: Secondary | ICD-10-CM | POA: Diagnosis not present

## 2016-03-15 DIAGNOSIS — E1129 Type 2 diabetes mellitus with other diabetic kidney complication: Secondary | ICD-10-CM | POA: Diagnosis not present

## 2016-03-15 DIAGNOSIS — N186 End stage renal disease: Secondary | ICD-10-CM | POA: Diagnosis not present

## 2016-03-15 DIAGNOSIS — Z992 Dependence on renal dialysis: Secondary | ICD-10-CM | POA: Diagnosis not present

## 2016-03-17 DIAGNOSIS — E1129 Type 2 diabetes mellitus with other diabetic kidney complication: Secondary | ICD-10-CM | POA: Diagnosis not present

## 2016-03-17 DIAGNOSIS — D500 Iron deficiency anemia secondary to blood loss (chronic): Secondary | ICD-10-CM | POA: Diagnosis not present

## 2016-03-17 DIAGNOSIS — D696 Thrombocytopenia, unspecified: Secondary | ICD-10-CM | POA: Diagnosis not present

## 2016-03-17 DIAGNOSIS — D631 Anemia in chronic kidney disease: Secondary | ICD-10-CM | POA: Diagnosis not present

## 2016-03-17 DIAGNOSIS — N2581 Secondary hyperparathyroidism of renal origin: Secondary | ICD-10-CM | POA: Diagnosis not present

## 2016-03-17 DIAGNOSIS — D509 Iron deficiency anemia, unspecified: Secondary | ICD-10-CM | POA: Diagnosis not present

## 2016-03-17 DIAGNOSIS — N186 End stage renal disease: Secondary | ICD-10-CM | POA: Diagnosis not present

## 2016-03-18 DIAGNOSIS — Z7901 Long term (current) use of anticoagulants: Secondary | ICD-10-CM | POA: Diagnosis not present

## 2016-03-19 DIAGNOSIS — N186 End stage renal disease: Secondary | ICD-10-CM | POA: Diagnosis not present

## 2016-03-19 DIAGNOSIS — N2581 Secondary hyperparathyroidism of renal origin: Secondary | ICD-10-CM | POA: Diagnosis not present

## 2016-03-19 DIAGNOSIS — E1129 Type 2 diabetes mellitus with other diabetic kidney complication: Secondary | ICD-10-CM | POA: Diagnosis not present

## 2016-03-19 DIAGNOSIS — D631 Anemia in chronic kidney disease: Secondary | ICD-10-CM | POA: Diagnosis not present

## 2016-03-19 DIAGNOSIS — D509 Iron deficiency anemia, unspecified: Secondary | ICD-10-CM | POA: Diagnosis not present

## 2016-03-19 DIAGNOSIS — D696 Thrombocytopenia, unspecified: Secondary | ICD-10-CM | POA: Diagnosis not present

## 2016-03-21 DIAGNOSIS — N186 End stage renal disease: Secondary | ICD-10-CM | POA: Diagnosis not present

## 2016-03-21 DIAGNOSIS — D509 Iron deficiency anemia, unspecified: Secondary | ICD-10-CM | POA: Diagnosis not present

## 2016-03-21 DIAGNOSIS — N2581 Secondary hyperparathyroidism of renal origin: Secondary | ICD-10-CM | POA: Diagnosis not present

## 2016-03-21 DIAGNOSIS — D631 Anemia in chronic kidney disease: Secondary | ICD-10-CM | POA: Diagnosis not present

## 2016-03-21 DIAGNOSIS — E1129 Type 2 diabetes mellitus with other diabetic kidney complication: Secondary | ICD-10-CM | POA: Diagnosis not present

## 2016-03-21 DIAGNOSIS — D696 Thrombocytopenia, unspecified: Secondary | ICD-10-CM | POA: Diagnosis not present

## 2016-03-24 DIAGNOSIS — D696 Thrombocytopenia, unspecified: Secondary | ICD-10-CM | POA: Diagnosis not present

## 2016-03-24 DIAGNOSIS — E1129 Type 2 diabetes mellitus with other diabetic kidney complication: Secondary | ICD-10-CM | POA: Diagnosis not present

## 2016-03-24 DIAGNOSIS — D509 Iron deficiency anemia, unspecified: Secondary | ICD-10-CM | POA: Diagnosis not present

## 2016-03-24 DIAGNOSIS — D631 Anemia in chronic kidney disease: Secondary | ICD-10-CM | POA: Diagnosis not present

## 2016-03-24 DIAGNOSIS — N2581 Secondary hyperparathyroidism of renal origin: Secondary | ICD-10-CM | POA: Diagnosis not present

## 2016-03-24 DIAGNOSIS — N186 End stage renal disease: Secondary | ICD-10-CM | POA: Diagnosis not present

## 2016-03-25 DIAGNOSIS — Z7901 Long term (current) use of anticoagulants: Secondary | ICD-10-CM | POA: Diagnosis not present

## 2016-03-26 DIAGNOSIS — N2581 Secondary hyperparathyroidism of renal origin: Secondary | ICD-10-CM | POA: Diagnosis not present

## 2016-03-26 DIAGNOSIS — D509 Iron deficiency anemia, unspecified: Secondary | ICD-10-CM | POA: Diagnosis not present

## 2016-03-26 DIAGNOSIS — D696 Thrombocytopenia, unspecified: Secondary | ICD-10-CM | POA: Diagnosis not present

## 2016-03-26 DIAGNOSIS — D631 Anemia in chronic kidney disease: Secondary | ICD-10-CM | POA: Diagnosis not present

## 2016-03-26 DIAGNOSIS — E1129 Type 2 diabetes mellitus with other diabetic kidney complication: Secondary | ICD-10-CM | POA: Diagnosis not present

## 2016-03-26 DIAGNOSIS — N186 End stage renal disease: Secondary | ICD-10-CM | POA: Diagnosis not present

## 2016-03-28 DIAGNOSIS — N186 End stage renal disease: Secondary | ICD-10-CM | POA: Diagnosis not present

## 2016-03-28 DIAGNOSIS — D696 Thrombocytopenia, unspecified: Secondary | ICD-10-CM | POA: Diagnosis not present

## 2016-03-28 DIAGNOSIS — N2581 Secondary hyperparathyroidism of renal origin: Secondary | ICD-10-CM | POA: Diagnosis not present

## 2016-03-28 DIAGNOSIS — D509 Iron deficiency anemia, unspecified: Secondary | ICD-10-CM | POA: Diagnosis not present

## 2016-03-28 DIAGNOSIS — E1129 Type 2 diabetes mellitus with other diabetic kidney complication: Secondary | ICD-10-CM | POA: Diagnosis not present

## 2016-03-28 DIAGNOSIS — D631 Anemia in chronic kidney disease: Secondary | ICD-10-CM | POA: Diagnosis not present

## 2016-03-30 DIAGNOSIS — E119 Type 2 diabetes mellitus without complications: Secondary | ICD-10-CM | POA: Diagnosis not present

## 2016-03-30 DIAGNOSIS — H409 Unspecified glaucoma: Secondary | ICD-10-CM | POA: Diagnosis not present

## 2016-03-30 DIAGNOSIS — M25569 Pain in unspecified knee: Secondary | ICD-10-CM | POA: Diagnosis not present

## 2016-03-30 DIAGNOSIS — M1990 Unspecified osteoarthritis, unspecified site: Secondary | ICD-10-CM | POA: Diagnosis not present

## 2016-03-30 DIAGNOSIS — N186 End stage renal disease: Secondary | ICD-10-CM | POA: Diagnosis not present

## 2016-03-30 DIAGNOSIS — Z86718 Personal history of other venous thrombosis and embolism: Secondary | ICD-10-CM | POA: Diagnosis not present

## 2016-03-30 DIAGNOSIS — I10 Essential (primary) hypertension: Secondary | ICD-10-CM | POA: Diagnosis not present

## 2016-03-30 DIAGNOSIS — I509 Heart failure, unspecified: Secondary | ICD-10-CM | POA: Diagnosis not present

## 2016-03-31 DIAGNOSIS — E1129 Type 2 diabetes mellitus with other diabetic kidney complication: Secondary | ICD-10-CM | POA: Diagnosis not present

## 2016-03-31 DIAGNOSIS — N2581 Secondary hyperparathyroidism of renal origin: Secondary | ICD-10-CM | POA: Diagnosis not present

## 2016-03-31 DIAGNOSIS — D696 Thrombocytopenia, unspecified: Secondary | ICD-10-CM | POA: Diagnosis not present

## 2016-03-31 DIAGNOSIS — D631 Anemia in chronic kidney disease: Secondary | ICD-10-CM | POA: Diagnosis not present

## 2016-03-31 DIAGNOSIS — N186 End stage renal disease: Secondary | ICD-10-CM | POA: Diagnosis not present

## 2016-03-31 DIAGNOSIS — D509 Iron deficiency anemia, unspecified: Secondary | ICD-10-CM | POA: Diagnosis not present

## 2016-04-02 DIAGNOSIS — E1129 Type 2 diabetes mellitus with other diabetic kidney complication: Secondary | ICD-10-CM | POA: Diagnosis not present

## 2016-04-02 DIAGNOSIS — D631 Anemia in chronic kidney disease: Secondary | ICD-10-CM | POA: Diagnosis not present

## 2016-04-02 DIAGNOSIS — D696 Thrombocytopenia, unspecified: Secondary | ICD-10-CM | POA: Diagnosis not present

## 2016-04-02 DIAGNOSIS — N2581 Secondary hyperparathyroidism of renal origin: Secondary | ICD-10-CM | POA: Diagnosis not present

## 2016-04-02 DIAGNOSIS — D509 Iron deficiency anemia, unspecified: Secondary | ICD-10-CM | POA: Diagnosis not present

## 2016-04-02 DIAGNOSIS — N186 End stage renal disease: Secondary | ICD-10-CM | POA: Diagnosis not present

## 2016-04-03 DIAGNOSIS — Z7901 Long term (current) use of anticoagulants: Secondary | ICD-10-CM | POA: Diagnosis not present

## 2016-04-04 DIAGNOSIS — D631 Anemia in chronic kidney disease: Secondary | ICD-10-CM | POA: Diagnosis not present

## 2016-04-04 DIAGNOSIS — N186 End stage renal disease: Secondary | ICD-10-CM | POA: Diagnosis not present

## 2016-04-04 DIAGNOSIS — N2581 Secondary hyperparathyroidism of renal origin: Secondary | ICD-10-CM | POA: Diagnosis not present

## 2016-04-04 DIAGNOSIS — E1129 Type 2 diabetes mellitus with other diabetic kidney complication: Secondary | ICD-10-CM | POA: Diagnosis not present

## 2016-04-04 DIAGNOSIS — D509 Iron deficiency anemia, unspecified: Secondary | ICD-10-CM | POA: Diagnosis not present

## 2016-04-04 DIAGNOSIS — D696 Thrombocytopenia, unspecified: Secondary | ICD-10-CM | POA: Diagnosis not present

## 2016-04-07 DIAGNOSIS — E1129 Type 2 diabetes mellitus with other diabetic kidney complication: Secondary | ICD-10-CM | POA: Diagnosis not present

## 2016-04-07 DIAGNOSIS — D631 Anemia in chronic kidney disease: Secondary | ICD-10-CM | POA: Diagnosis not present

## 2016-04-07 DIAGNOSIS — D696 Thrombocytopenia, unspecified: Secondary | ICD-10-CM | POA: Diagnosis not present

## 2016-04-07 DIAGNOSIS — N186 End stage renal disease: Secondary | ICD-10-CM | POA: Diagnosis not present

## 2016-04-07 DIAGNOSIS — N2581 Secondary hyperparathyroidism of renal origin: Secondary | ICD-10-CM | POA: Diagnosis not present

## 2016-04-07 DIAGNOSIS — D509 Iron deficiency anemia, unspecified: Secondary | ICD-10-CM | POA: Diagnosis not present

## 2016-04-08 DIAGNOSIS — Z7901 Long term (current) use of anticoagulants: Secondary | ICD-10-CM | POA: Diagnosis not present

## 2016-04-09 DIAGNOSIS — D509 Iron deficiency anemia, unspecified: Secondary | ICD-10-CM | POA: Diagnosis not present

## 2016-04-09 DIAGNOSIS — E1129 Type 2 diabetes mellitus with other diabetic kidney complication: Secondary | ICD-10-CM | POA: Diagnosis not present

## 2016-04-09 DIAGNOSIS — N186 End stage renal disease: Secondary | ICD-10-CM | POA: Diagnosis not present

## 2016-04-09 DIAGNOSIS — N2581 Secondary hyperparathyroidism of renal origin: Secondary | ICD-10-CM | POA: Diagnosis not present

## 2016-04-09 DIAGNOSIS — D631 Anemia in chronic kidney disease: Secondary | ICD-10-CM | POA: Diagnosis not present

## 2016-04-09 DIAGNOSIS — D696 Thrombocytopenia, unspecified: Secondary | ICD-10-CM | POA: Diagnosis not present

## 2016-04-10 DIAGNOSIS — M65841 Other synovitis and tenosynovitis, right hand: Secondary | ICD-10-CM | POA: Diagnosis not present

## 2016-04-10 DIAGNOSIS — M65311 Trigger thumb, right thumb: Secondary | ICD-10-CM | POA: Diagnosis not present

## 2016-04-10 DIAGNOSIS — G5601 Carpal tunnel syndrome, right upper limb: Secondary | ICD-10-CM | POA: Diagnosis not present

## 2016-04-11 DIAGNOSIS — N2581 Secondary hyperparathyroidism of renal origin: Secondary | ICD-10-CM | POA: Diagnosis not present

## 2016-04-11 DIAGNOSIS — D509 Iron deficiency anemia, unspecified: Secondary | ICD-10-CM | POA: Diagnosis not present

## 2016-04-11 DIAGNOSIS — E1129 Type 2 diabetes mellitus with other diabetic kidney complication: Secondary | ICD-10-CM | POA: Diagnosis not present

## 2016-04-11 DIAGNOSIS — N186 End stage renal disease: Secondary | ICD-10-CM | POA: Diagnosis not present

## 2016-04-11 DIAGNOSIS — D631 Anemia in chronic kidney disease: Secondary | ICD-10-CM | POA: Diagnosis not present

## 2016-04-11 DIAGNOSIS — D696 Thrombocytopenia, unspecified: Secondary | ICD-10-CM | POA: Diagnosis not present

## 2016-04-13 DIAGNOSIS — M25569 Pain in unspecified knee: Secondary | ICD-10-CM | POA: Diagnosis not present

## 2016-04-13 DIAGNOSIS — I69954 Hemiplegia and hemiparesis following unspecified cerebrovascular disease affecting left non-dominant side: Secondary | ICD-10-CM | POA: Diagnosis not present

## 2016-04-13 DIAGNOSIS — H409 Unspecified glaucoma: Secondary | ICD-10-CM | POA: Diagnosis not present

## 2016-04-13 DIAGNOSIS — E119 Type 2 diabetes mellitus without complications: Secondary | ICD-10-CM | POA: Diagnosis not present

## 2016-04-13 DIAGNOSIS — I509 Heart failure, unspecified: Secondary | ICD-10-CM | POA: Diagnosis not present

## 2016-04-13 DIAGNOSIS — M1990 Unspecified osteoarthritis, unspecified site: Secondary | ICD-10-CM | POA: Diagnosis not present

## 2016-04-13 DIAGNOSIS — N186 End stage renal disease: Secondary | ICD-10-CM | POA: Diagnosis not present

## 2016-04-13 DIAGNOSIS — I10 Essential (primary) hypertension: Secondary | ICD-10-CM | POA: Diagnosis not present

## 2016-04-14 DIAGNOSIS — E119 Type 2 diabetes mellitus without complications: Secondary | ICD-10-CM | POA: Diagnosis not present

## 2016-04-14 DIAGNOSIS — B070 Plantar wart: Secondary | ICD-10-CM | POA: Diagnosis not present

## 2016-04-14 DIAGNOSIS — N2581 Secondary hyperparathyroidism of renal origin: Secondary | ICD-10-CM | POA: Diagnosis not present

## 2016-04-14 DIAGNOSIS — D509 Iron deficiency anemia, unspecified: Secondary | ICD-10-CM | POA: Diagnosis not present

## 2016-04-14 DIAGNOSIS — B351 Tinea unguium: Secondary | ICD-10-CM | POA: Diagnosis not present

## 2016-04-14 DIAGNOSIS — Z7901 Long term (current) use of anticoagulants: Secondary | ICD-10-CM | POA: Diagnosis not present

## 2016-04-14 DIAGNOSIS — D631 Anemia in chronic kidney disease: Secondary | ICD-10-CM | POA: Diagnosis not present

## 2016-04-14 DIAGNOSIS — N186 End stage renal disease: Secondary | ICD-10-CM | POA: Diagnosis not present

## 2016-04-14 DIAGNOSIS — D696 Thrombocytopenia, unspecified: Secondary | ICD-10-CM | POA: Diagnosis not present

## 2016-04-14 DIAGNOSIS — E213 Hyperparathyroidism, unspecified: Secondary | ICD-10-CM | POA: Diagnosis not present

## 2016-04-14 DIAGNOSIS — I10 Essential (primary) hypertension: Secondary | ICD-10-CM | POA: Diagnosis not present

## 2016-04-14 DIAGNOSIS — E1129 Type 2 diabetes mellitus with other diabetic kidney complication: Secondary | ICD-10-CM | POA: Diagnosis not present

## 2016-04-14 DIAGNOSIS — Z993 Dependence on wheelchair: Secondary | ICD-10-CM | POA: Diagnosis not present

## 2016-04-14 DIAGNOSIS — M79673 Pain in unspecified foot: Secondary | ICD-10-CM | POA: Diagnosis not present

## 2016-04-14 DIAGNOSIS — L851 Acquired keratosis [keratoderma] palmaris et plantaris: Secondary | ICD-10-CM | POA: Diagnosis not present

## 2016-04-14 DIAGNOSIS — Z79899 Other long term (current) drug therapy: Secondary | ICD-10-CM | POA: Diagnosis not present

## 2016-04-14 IMAGING — DX DG CHEST 2V
2 series · 2 of 2 positions shown · non-contrast
Comparison: 02/02/2015

CLINICAL DATA: Short of breath

EXAM:
CHEST  2 VIEW

[x chest ap]
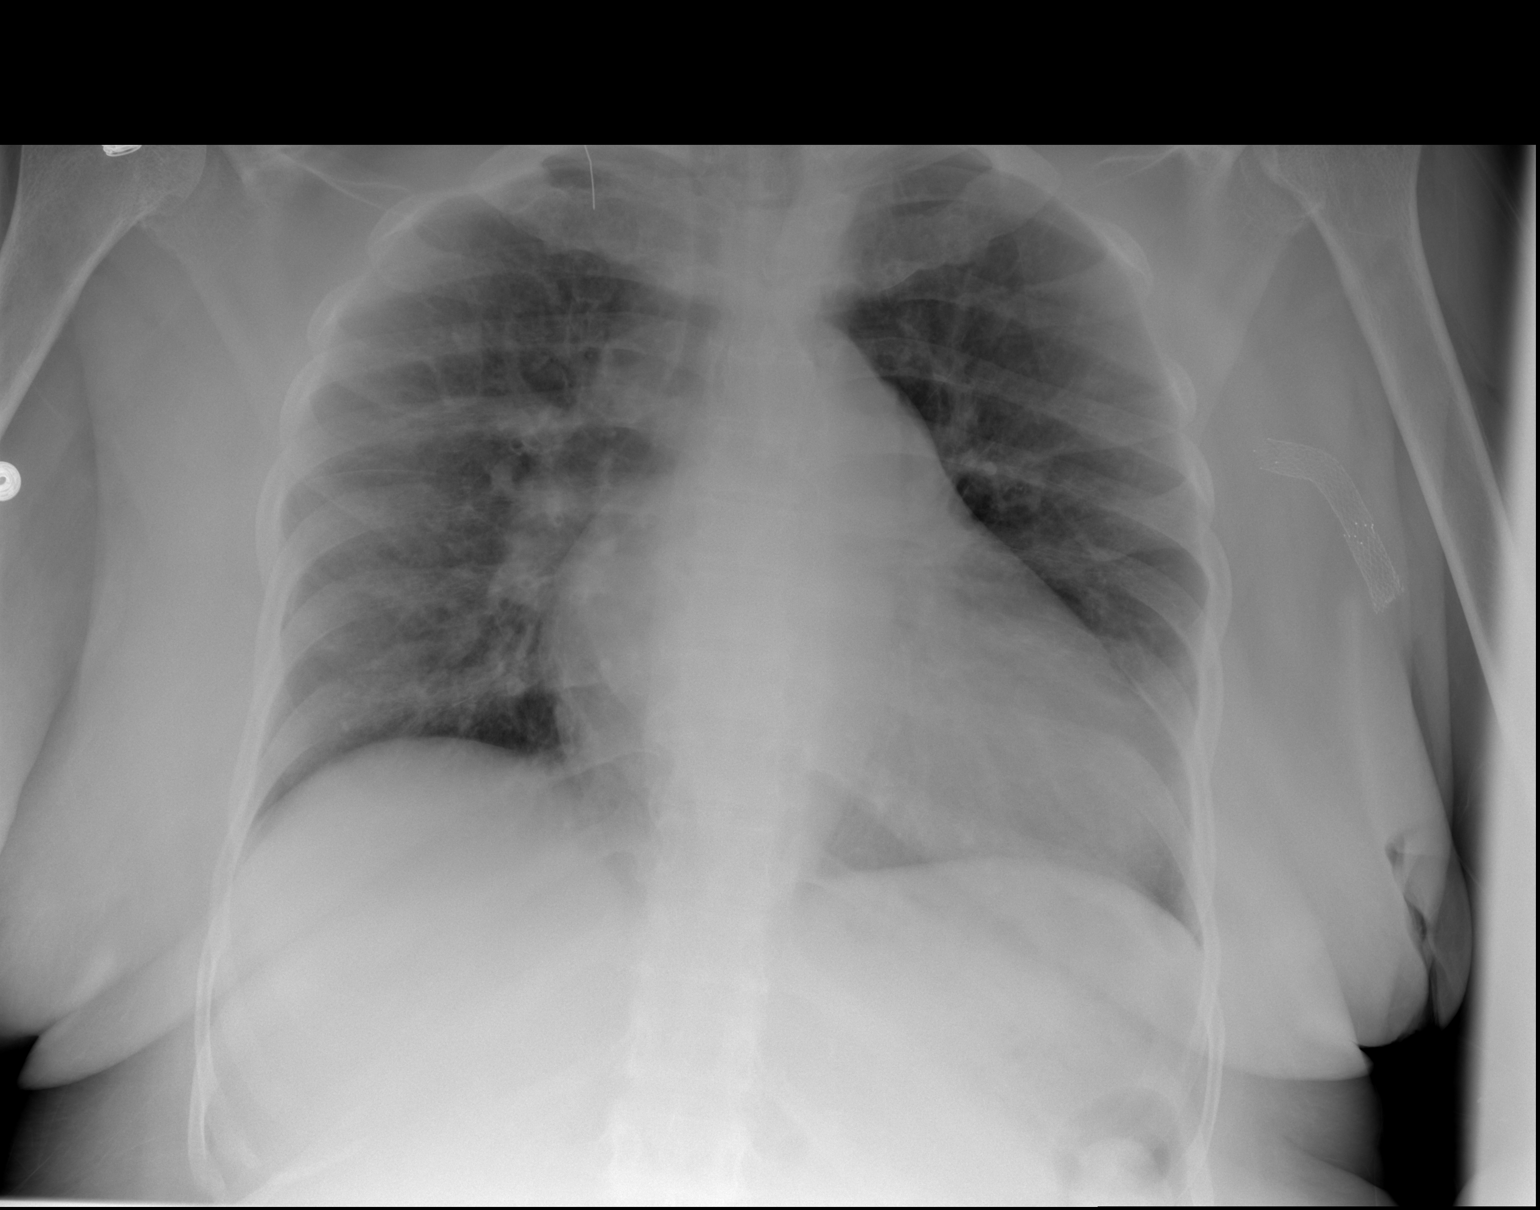

[w chest lat]
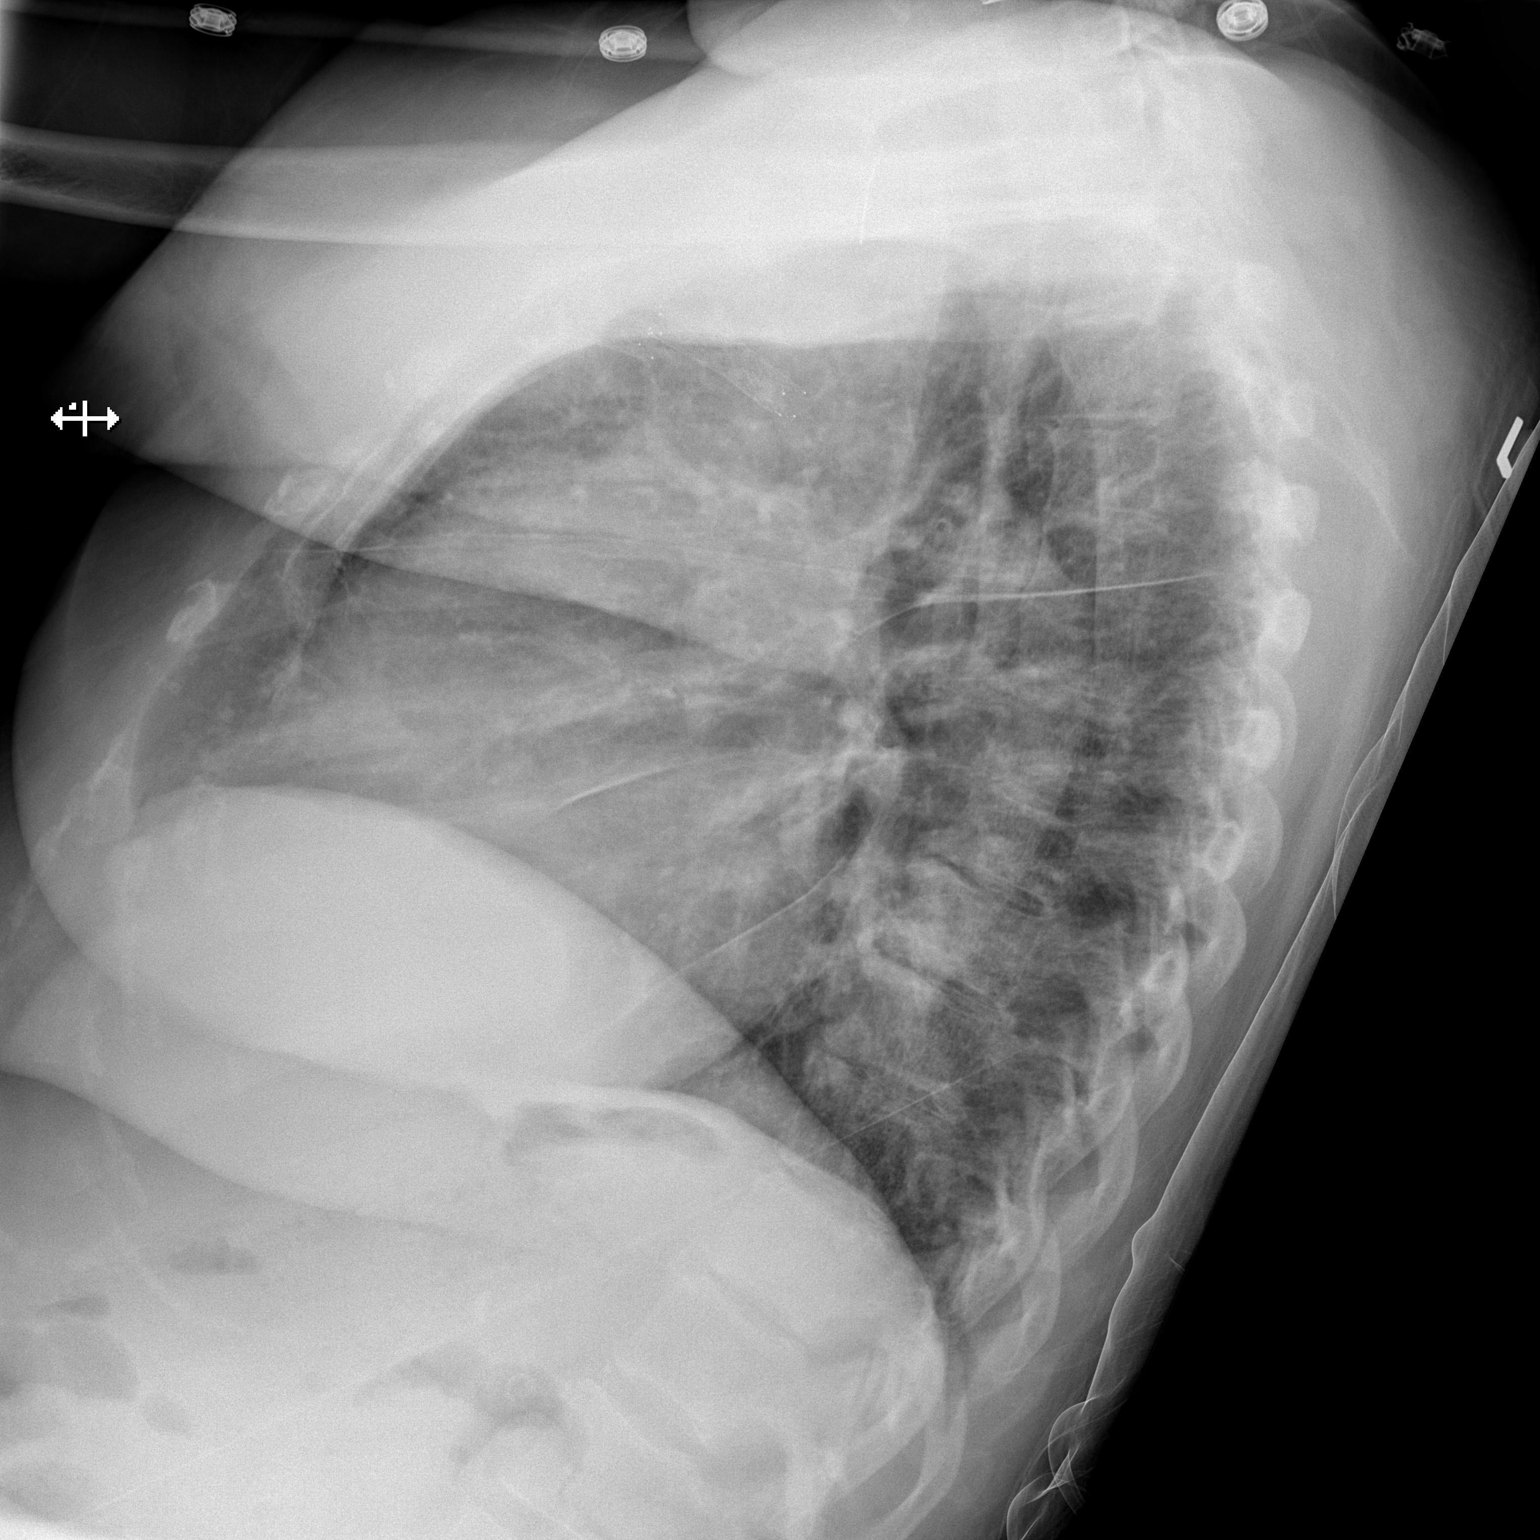

[2 of 2 positions shown; findings below may reference images not displayed]

FINDINGS: Stable linear metal object projecting over the right apex. Moderate
cardiomegaly. Lungs under aerated and clear. No pleural effusion. No
pneumothorax. Vascular congestion.
IMPRESSION: Cardiomegaly and vascular congestion.

## 2016-04-15 DIAGNOSIS — Z7901 Long term (current) use of anticoagulants: Secondary | ICD-10-CM | POA: Diagnosis not present

## 2016-04-15 DIAGNOSIS — N186 End stage renal disease: Secondary | ICD-10-CM | POA: Diagnosis not present

## 2016-04-15 DIAGNOSIS — E1129 Type 2 diabetes mellitus with other diabetic kidney complication: Secondary | ICD-10-CM | POA: Diagnosis not present

## 2016-04-15 DIAGNOSIS — Z992 Dependence on renal dialysis: Secondary | ICD-10-CM | POA: Diagnosis not present

## 2016-04-16 DIAGNOSIS — N186 End stage renal disease: Secondary | ICD-10-CM | POA: Diagnosis not present

## 2016-04-16 DIAGNOSIS — N2581 Secondary hyperparathyroidism of renal origin: Secondary | ICD-10-CM | POA: Diagnosis not present

## 2016-04-16 DIAGNOSIS — D509 Iron deficiency anemia, unspecified: Secondary | ICD-10-CM | POA: Diagnosis not present

## 2016-04-16 DIAGNOSIS — E1129 Type 2 diabetes mellitus with other diabetic kidney complication: Secondary | ICD-10-CM | POA: Diagnosis not present

## 2016-04-18 DIAGNOSIS — N2581 Secondary hyperparathyroidism of renal origin: Secondary | ICD-10-CM | POA: Diagnosis not present

## 2016-04-18 DIAGNOSIS — E1129 Type 2 diabetes mellitus with other diabetic kidney complication: Secondary | ICD-10-CM | POA: Diagnosis not present

## 2016-04-18 DIAGNOSIS — D509 Iron deficiency anemia, unspecified: Secondary | ICD-10-CM | POA: Diagnosis not present

## 2016-04-18 DIAGNOSIS — N186 End stage renal disease: Secondary | ICD-10-CM | POA: Diagnosis not present

## 2016-04-20 DIAGNOSIS — N2581 Secondary hyperparathyroidism of renal origin: Secondary | ICD-10-CM | POA: Diagnosis not present

## 2016-04-20 DIAGNOSIS — N186 End stage renal disease: Secondary | ICD-10-CM | POA: Diagnosis not present

## 2016-04-20 DIAGNOSIS — D509 Iron deficiency anemia, unspecified: Secondary | ICD-10-CM | POA: Diagnosis not present

## 2016-04-20 DIAGNOSIS — E1129 Type 2 diabetes mellitus with other diabetic kidney complication: Secondary | ICD-10-CM | POA: Diagnosis not present

## 2016-04-21 DIAGNOSIS — M25571 Pain in right ankle and joints of right foot: Secondary | ICD-10-CM | POA: Diagnosis not present

## 2016-04-21 DIAGNOSIS — R200 Anesthesia of skin: Secondary | ICD-10-CM | POA: Diagnosis not present

## 2016-04-22 DIAGNOSIS — Z7901 Long term (current) use of anticoagulants: Secondary | ICD-10-CM | POA: Diagnosis not present

## 2016-04-23 DIAGNOSIS — E1129 Type 2 diabetes mellitus with other diabetic kidney complication: Secondary | ICD-10-CM | POA: Diagnosis not present

## 2016-04-23 DIAGNOSIS — N186 End stage renal disease: Secondary | ICD-10-CM | POA: Diagnosis not present

## 2016-04-23 DIAGNOSIS — D509 Iron deficiency anemia, unspecified: Secondary | ICD-10-CM | POA: Diagnosis not present

## 2016-04-23 DIAGNOSIS — N2581 Secondary hyperparathyroidism of renal origin: Secondary | ICD-10-CM | POA: Diagnosis not present

## 2016-04-24 DIAGNOSIS — M79641 Pain in right hand: Secondary | ICD-10-CM | POA: Diagnosis not present

## 2016-04-25 DIAGNOSIS — E1129 Type 2 diabetes mellitus with other diabetic kidney complication: Secondary | ICD-10-CM | POA: Diagnosis not present

## 2016-04-25 DIAGNOSIS — N186 End stage renal disease: Secondary | ICD-10-CM | POA: Diagnosis not present

## 2016-04-25 DIAGNOSIS — D509 Iron deficiency anemia, unspecified: Secondary | ICD-10-CM | POA: Diagnosis not present

## 2016-04-25 DIAGNOSIS — N2581 Secondary hyperparathyroidism of renal origin: Secondary | ICD-10-CM | POA: Diagnosis not present

## 2016-04-28 ENCOUNTER — Emergency Department (HOSPITAL_COMMUNITY)
Admission: EM | Admit: 2016-04-28 | Discharge: 2016-04-28 | Disposition: A | Discharge status: Home / Self Care | Payer: Medicare Other | Attending: Emergency Medicine | Admitting: Emergency Medicine

## 2016-04-28 ENCOUNTER — Encounter (HOSPITAL_COMMUNITY): Payer: Self-pay

## 2016-04-28 DIAGNOSIS — Z87891 Personal history of nicotine dependence: Secondary | ICD-10-CM | POA: Diagnosis not present

## 2016-04-28 DIAGNOSIS — I132 Hypertensive heart and chronic kidney disease with heart failure and with stage 5 chronic kidney disease, or end stage renal disease: Secondary | ICD-10-CM | POA: Diagnosis not present

## 2016-04-28 DIAGNOSIS — T8189XA Other complications of procedures, not elsewhere classified, initial encounter: Secondary | ICD-10-CM | POA: Diagnosis not present

## 2016-04-28 DIAGNOSIS — Z794 Long term (current) use of insulin: Secondary | ICD-10-CM | POA: Insufficient documentation

## 2016-04-28 DIAGNOSIS — I2510 Atherosclerotic heart disease of native coronary artery without angina pectoris: Secondary | ICD-10-CM | POA: Insufficient documentation

## 2016-04-28 DIAGNOSIS — Z7901 Long term (current) use of anticoagulants: Secondary | ICD-10-CM | POA: Insufficient documentation

## 2016-04-28 DIAGNOSIS — I5042 Chronic combined systolic (congestive) and diastolic (congestive) heart failure: Secondary | ICD-10-CM | POA: Diagnosis not present

## 2016-04-28 DIAGNOSIS — E1122 Type 2 diabetes mellitus with diabetic chronic kidney disease: Secondary | ICD-10-CM | POA: Insufficient documentation

## 2016-04-28 DIAGNOSIS — N189 Chronic kidney disease, unspecified: Secondary | ICD-10-CM | POA: Diagnosis not present

## 2016-04-28 DIAGNOSIS — E1129 Type 2 diabetes mellitus with other diabetic kidney complication: Secondary | ICD-10-CM | POA: Diagnosis not present

## 2016-04-28 DIAGNOSIS — E1140 Type 2 diabetes mellitus with diabetic neuropathy, unspecified: Secondary | ICD-10-CM | POA: Diagnosis not present

## 2016-04-28 DIAGNOSIS — T82838A Hemorrhage of vascular prosthetic devices, implants and grafts, initial encounter: Secondary | ICD-10-CM | POA: Insufficient documentation

## 2016-04-28 DIAGNOSIS — Y658 Other specified misadventures during surgical and medical care: Secondary | ICD-10-CM | POA: Insufficient documentation

## 2016-04-28 DIAGNOSIS — Z8673 Personal history of transient ischemic attack (TIA), and cerebral infarction without residual deficits: Secondary | ICD-10-CM | POA: Insufficient documentation

## 2016-04-28 DIAGNOSIS — Z992 Dependence on renal dialysis: Secondary | ICD-10-CM | POA: Insufficient documentation

## 2016-04-28 DIAGNOSIS — N2581 Secondary hyperparathyroidism of renal origin: Secondary | ICD-10-CM | POA: Diagnosis not present

## 2016-04-28 DIAGNOSIS — R58 Hemorrhage, not elsewhere classified: Secondary | ICD-10-CM | POA: Diagnosis not present

## 2016-04-28 DIAGNOSIS — T82898A Other specified complication of vascular prosthetic devices, implants and grafts, initial encounter: Secondary | ICD-10-CM | POA: Diagnosis not present

## 2016-04-28 DIAGNOSIS — N186 End stage renal disease: Secondary | ICD-10-CM | POA: Insufficient documentation

## 2016-04-28 DIAGNOSIS — R791 Abnormal coagulation profile: Secondary | ICD-10-CM

## 2016-04-28 DIAGNOSIS — D509 Iron deficiency anemia, unspecified: Secondary | ICD-10-CM | POA: Diagnosis not present

## 2016-04-28 DIAGNOSIS — K5900 Constipation, unspecified: Secondary | ICD-10-CM

## 2016-04-28 LAB — I-STAT CHEM 8, ED
BUN: 28 mg/dL — ABNORMAL HIGH (ref 6–20)
CHLORIDE: 91 mmol/L — AB (ref 101–111)
CREATININE: 5.5 mg/dL — AB (ref 0.44–1.00)
Calcium, Ion: 1 mmol/L — ABNORMAL LOW (ref 1.15–1.40)
GLUCOSE: 313 mg/dL — AB (ref 65–99)
HCT: 45 % (ref 36.0–46.0)
Hemoglobin: 15.3 g/dL — ABNORMAL HIGH (ref 12.0–15.0)
Potassium: 4.2 mmol/L (ref 3.5–5.1)
Sodium: 135 mmol/L (ref 135–145)
TCO2: 33 mmol/L (ref 0–100)

## 2016-04-28 LAB — CBC
HEMATOCRIT: 42.2 % (ref 36.0–46.0)
HEMOGLOBIN: 13.8 g/dL (ref 12.0–15.0)
MCH: 31.9 pg (ref 26.0–34.0)
MCHC: 32.7 g/dL (ref 30.0–36.0)
MCV: 97.5 fL (ref 78.0–100.0)
Platelets: 101 10*3/uL — ABNORMAL LOW (ref 150–400)
RBC: 4.33 MIL/uL (ref 3.87–5.11)
RDW: 17.8 % — ABNORMAL HIGH (ref 11.5–15.5)
WBC: 3.3 10*3/uL — ABNORMAL LOW (ref 4.0–10.5)

## 2016-04-28 LAB — POC OCCULT BLOOD, ED: FECAL OCCULT BLD: NEGATIVE

## 2016-04-28 LAB — PROTIME-INR
INR: 5.81 — AB
Prothrombin Time: 54 seconds — ABNORMAL HIGH (ref 11.4–15.2)

## 2016-04-28 NOTE — ED Notes (Signed)
Nursing home staff verbalized understanding of stopping coumadin x 2 days.

## 2016-04-28 NOTE — ED Triage Notes (Signed)
Pt. Coming from [facility] via [facility] for bleeding shunt. Pt. Had dialysis today, which ended at 1230. Pt. Standing at her sink today when she noticed her left leg shunt dripping blood. EMS reports ~30mL blood noted on floor when they arrived. Bleeding stopped on its own. Pt. Denies any pain. Pt. Aox4.

## 2016-04-28 NOTE — ED Provider Notes (Signed)
[facility] [facility] DEPT Provider Note   CSN: [id number] Arrival date & time: 04/28/16  1850     History   Chief Complaint Chief Complaint  Patient presents with  . Vascular Access Problem    HPI Stefanie [name] is a 71 y.o. female.  HPI Pt. Coming from [facility] nursing facility via [facility] for bleeding shunt. Pt. Had dialysis today, which ended at 1230. Pt. Standing at her sink today when she noticed her left leg shunt dripping blood. EMS reports ~45m blood noted on floor when they arrived. Bleeding stopped on its own. Pt. Denies any pain. Pt. Aox4.   Patient states the bleeding has been ongoing for about 3 days. She was dialysis on Tuesday, Thursday, Saturday. She is on Coumadin and has been taking her medication as prescribed. She denies any rectal bleeding or hematemesis, hematuria, or other bleeding. No difficulty with dialysis today and had her full treatment. She denies lightheadedness, chest pain, shortness of breath, palpitations.  Past Medical History:  Diagnosis Date  . Anemia   . CAD (coronary artery disease)   . CHF (congestive heart failure) ([place]   . Constipation   . CVA (cerebral vascular accident) ([place]  left sided weakness  . Diabetes mellitus   . End stage renal disease ([place]    initiated HD 2006  . Hx of cardiovascular stress test    a. MV 03/2010:  no ischemia, EF 55%  . Hx of echocardiogram    a. Echo 03/2010: EF 55-60%, Gr 1 diast dysfn, trivial MS, mild to mod LAE, PASP 34, ;  b. Echo 11/13: mild LVH, EF 60%, Gr 1 diast dysfn, Ao sclerosis, no AS, MAC, slight MS, mean 3 mmHg, PASP 34  . Hyperlipidemia   . Hypertension   . Protein malnutrition (HP[place]   . Renal insufficiency   . Retinal detachment, bilateral   . Secondary hyperparathyroidism ([facility]     Patient Active Problem List   Diagnosis Date Noted  . Acute blood loss anemia 02/09/2016  . Hypotension due to blood loss 02/09/2016  . Chronic combined systolic and diastolic CHF  (congestive heart failure) ([place] 02/09/2016  . Hemorrhage 02/08/2016  . Anemia 02/08/2016  . Left leg pain 04/10/2015  . Bacteremia 04/10/2015  . Lactic acidosis   . Cellulitis 04/09/2015  . History of CVA with residual deficit 02/11/2015  . Debility 02/08/2015  . DVT (deep venous thrombosis) ([place] [place]   . CHF (congestive heart failure) ([place]   . Chronic diastolic congestive heart failure ([place]   . ESRD (end stage renal disease) ([place]   . Congestive dilated cardiomyopathy (HC[place]   . Left leg DVT (HM[place] 02/01/2015  . Leukocytosis 02/01/2015  . Elevated troponin 02/01/2015  . SOB (shortness of breath) 02/01/2015  . Type 2 diabetes, controlled, with neuropathy ([place] 12/20/2014  . Pain due to onychomycosis of toenail 12/20/2014  . 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 Mechanical complication of other vascular device, implant, and graft 05/19/2013  . PAD (peripheral artery disease) ([place] 02/24/2012  . Murmur 12/24/2011  . Bradycardia-intermittent sinus 07/10/2010  . DYSPNEA ON EXERTION 02/25/2010  . CHEST PAIN, EXERTIONAL 02/25/2010  . DM 02/24/2010  . PROTEIN MALNUTRITION 02/24/2010  . HLD (hyperlipidemia) 02/24/2010  . ANEMIA 02/24/2010  . Essential hypertension 02/24/2010  . Cerebral artery occlusion with cerebral infarction ([place] 02/24/2010  . ESRD on dialysis ([place] 02/24/2010  . Secondary renal hyperparathyroidism (HW[place] 02/24/2010    Past Surgical History:  Procedure Laterality Date  . ARTERIOVENOUS  GRAFT PLACEMENT     BUE numerous times  . ARTERIOVENOUS GRAFT PLACEMENT  11/13/10   Lt femoral - Dr. Kellie Simmering  . CARDIAC CATHETERIZATION N/A 02/05/2015   Procedure: Left Heart Cath and Coronary Angiography;  Surgeon: Jettie Booze, MD;  Location: [place] [facility];  Service: Cardiovascular;  Laterality: N/A;  . CATARACT EXTRACTION    . DG AV DIALYSIS GRAFT DECLOT OR  06/06/11, 08/26/11   performed in IR  . PERIPHERAL VASCULAR CATHETERIZATION N/A 02/06/2015   Procedure: A/V  Shuntogram;  Surgeon: Elam Dutch, MD;  Location: [place] [facility];  Service: Cardiovascular;  Laterality: N/A;  . PERIPHERAL VASCULAR CATHETERIZATION Left 02/26/2016   Procedure: A/V Shuntogram;  Surgeon: Waynetta Sandy, MD;  Location: [place] [facility];  Service: Cardiovascular;  Laterality: Left;  . PERIPHERAL VASCULAR CATHETERIZATION Left 02/26/2016   Procedure: Peripheral Vascular Balloon Angioplasty;  Surgeon: Waynetta Sandy, MD;  Location: [place] [facility];  Service: Cardiovascular;  Laterality: Left;  Lt thigh avf  . SHUNTOGRAM N/A 05/29/2013   Procedure: Earney Mallet;  Surgeon: Conrad [place], MD;  Location[facility] CATH LAB;  Service: Cardiovascular;  Laterality: N/A;  . SHUNTOGRAM Left 03/01/2014   Procedure: Earney Mallet;  Surgeon: Conrad Addy, MD;  Location: [facility] C[facility] LAB;  Service: Cardiovascular;  Laterality: Left;  . TUBAL LIGATION      OB History    No data available       Home Medications    Prior to Admission medications   Medication Sig Start Date End Date Taking? Authorizing Provider  cinacalcet (SENSIPAR) 30 MG tablet Take 60 mg by mouth daily.    Historical Provider, MD  diclofenac sodium (VOLTAREN) 1 % GEL Apply 2 g topically See admin instructions. Apply to both knees daily as needed for pain and to right hand 2-3 times a day as needed for pain (BRAND NAME VOLTAREN)    Historical Provider, MD  Insulin Degludec (TRESIBA FLEXTOUCH) 100 UNIT/ML SOPN Inject 4 Units into the skin at bedtime. CHECK BGL PRIOR TO DOSING    Historical Provider, MD  latanoprost (XALATAN) 0.005 % ophthalmic solution Place 1 drop into the left eye at bedtime.    Historical Provider, MD  lidocaine-prilocaine (EMLA) cream Apply 1 application topically as needed (topical anesthesia for hemodialysis if Gebauers and Lidocaine injection are ineffective.). Patient taking differently: Apply 1 application topically 3 (three) times a week.  02/25/15   Ivan Anchors Love, PA-C  lisinopril  (PRINIVIL,ZESTRIL) 30 MG tablet Take 30 mg by mouth every morning.     Historical Provider, MD  loratadine (CLARITIN) 10 MG tablet Take 10 mg by mouth daily.    Historical Provider, MD  oxyCODONE (OXY IR/ROXICODONE) 5 MG immediate release tablet Take 5 mg by mouth 2 (two) times daily.    Historical Provider, MD  polyethylene glycol [facility]) packet Use 17 gms three times daily until bowels move, with maximum of 3 consecutive days Patient taking differently: Take 17 g by mouth every Monday, Wednesday, and Friday. Mix 17 grams with 8 ounces of liquid 09/02/15   Charlann Lange, PA-C  pravastatin (PRAVACHOL) 20 MG tablet Take 20 mg by mouth at bedtime.    Historical Provider, MD  rOPINIRole (REQUIP) 0.5 MG tablet Take 0.5 mg by mouth at bedtime.    Historical Provider, MD  sennosides-docusate sodium (SENOKOT-S) 8.6-50 MG tablet Take 2 tablets by mouth daily.    Historical Provider, MD  sorbitol 70 % solution Take 30 mLs by mouth daily  as needed (for constipation).    Historical Provider, MD  warfarin (COUMADIN) 7.5 MG tablet Take 7.5 mg by mouth daily at 6 PM.    Historical Provider, MD    Family History Family History  Problem Relation Age of Onset  . Heart disease Mother   . Diabetes Mother   . Hypertension Mother   . Heart attack Mother   . Diabetes Other   . Kidney disease Other   . Cardiomyopathy    . Diabetes Daughter   . Diabetes Son   . Hypertension Son     Social History Social History  Substance Use Topics  . Smoking status: Former Smoker    Quit date: 03/17/1983  . Smokeless tobacco: Never Used  . Alcohol use No     Allergies   Patient has no known allergies.   Review of Systems Review of Systems  Constitutional: Negative for fever.  Allergic/Immunologic: Negative for immunocompromised state.  All other systems reviewed and are negative.    Physical Exam Updated Vital Signs BP (!) 86/42 (BP Location: Right Leg)   Pulse 72   Temp 98.5 F (36.9 C) (Oral)   Resp  16   Ht _0  (1.626 m)   Wt 71.2 kg   SpO2 100%   BMI 26.95 kg/m   Physical Exam  Constitutional: She appears well-developed and well-nourished. No distress.  HENT:  Head: Normocephalic and atraumatic.  Eyes: Conjunctivae are normal.  Neck: Neck supple.  Cardiovascular: Normal rate and regular rhythm.   No murmur heard. Fistula with thrill in LLE, hemostatic, nontender  Pulmonary/Chest: Effort normal and breath sounds normal. No respiratory distress.  Abdominal: Soft. There is no tenderness.  Genitourinary:  Genitourinary Comments: Rectal wo bleeding, melena, light brown stool with impaction  Musculoskeletal: She exhibits no edema.  Neurological: She is alert.  Skin: Skin is warm and dry.  Psychiatric: She has a normal mood and affect.  Nursing note and vitals reviewed.    ED Treatments / Results  Labs (all labs ordered are listed, but only abnormal results are displayed) Labs Reviewed  CBC - Abnormal; Notable for the following:       Result Value   WBC 3.3 (*)    RDW 17.8 (*)    Platelets 101 (*)    All other components within normal limits  PROTIME-INR - Abnormal; Notable for the following:    Prothrombin Time 54.0 (*)    INR 5.81 (*)    All other components within normal limits  I-STAT CHEM 8, ED - Abnormal; Notable for the following:    Chloride 91 (*)    BUN 28 (*)    Creatinine, Ser 5.50 (*)    Glucose, Bld 313 (*)    Calcium, Ion 1.00 (*)    Hemoglobin 15.3 (*)    All other components within normal limits  OCCULT BLOOD X 1 CARD TO LAB, STOOL  POC OCCULT BLOOD, ED    EKG  EKG Interpretation None       Radiology No results found.  Procedures Procedures (including critical care time)  Medications Ordered in ED Medications - No data to display   Initial Impression / Assessment and Plan / ED Course  I have reviewed the triage vital signs and the nursing notes.  Pertinent labs & imaging results that were available during my care of the  patient were reviewed by me and considered in my medical decision making (see chart for details).     Pt initially hypotensive  but repeat bp in 130s. Not symptomatic. Likely from volume down from dialysis. Hgb stable. INR elevated. Rectal negative. Pt also impacted on exam but is having bms. Advised to hold coumadin x 2 days and fu on 2nd day to repeat INR eval. Pt lives at a facility. Safe for dc home, strict return precautions. DC in good condition.     Final Clinical Impressions(s) / ED Diagnoses   Final diagnoses:  Constipation, unspecified constipation type  Elevated INR    New Prescriptions Discharge Medication List as of 04/28/2016  9:36 PM       Karma Greaser, MD 04/29/16 0020    Orlie Dakin, MD 04/29/16 [id number]

## 2016-04-28 NOTE — Discharge Instructions (Addendum)
Stop your coumadin for 2 days Your INR is 5.81 - resume again after You should have your INR checked again on the second day you stop it You have a lot of stool in your rectum - use miralax to help move stool

## 2016-04-28 NOTE — ED Provider Notes (Signed)
She reports bleeding from her dialysis graft at left thigh earlier today immediately after dialysis. Bleeding stopped when she applied direct pressure to the graft. She is presently asymptomatic. On exam alert no distress left lower extremity dialysis graft with good thrill. Nontender. No active bleeding.   Orlie Dakin, MD 04/28/16 2003

## 2016-04-29 DIAGNOSIS — Z7901 Long term (current) use of anticoagulants: Secondary | ICD-10-CM | POA: Diagnosis not present

## 2016-04-30 DIAGNOSIS — N186 End stage renal disease: Secondary | ICD-10-CM | POA: Diagnosis not present

## 2016-04-30 DIAGNOSIS — D509 Iron deficiency anemia, unspecified: Secondary | ICD-10-CM | POA: Diagnosis not present

## 2016-04-30 DIAGNOSIS — E1129 Type 2 diabetes mellitus with other diabetic kidney complication: Secondary | ICD-10-CM | POA: Diagnosis not present

## 2016-04-30 DIAGNOSIS — N2581 Secondary hyperparathyroidism of renal origin: Secondary | ICD-10-CM | POA: Diagnosis not present

## 2016-05-01 DIAGNOSIS — I871 Compression of vein: Secondary | ICD-10-CM | POA: Diagnosis not present

## 2016-05-01 DIAGNOSIS — Z992 Dependence on renal dialysis: Secondary | ICD-10-CM | POA: Diagnosis not present

## 2016-05-01 DIAGNOSIS — N186 End stage renal disease: Secondary | ICD-10-CM | POA: Diagnosis not present

## 2016-05-02 DIAGNOSIS — N2581 Secondary hyperparathyroidism of renal origin: Secondary | ICD-10-CM | POA: Diagnosis not present

## 2016-05-02 DIAGNOSIS — D509 Iron deficiency anemia, unspecified: Secondary | ICD-10-CM | POA: Diagnosis not present

## 2016-05-02 DIAGNOSIS — N186 End stage renal disease: Secondary | ICD-10-CM | POA: Diagnosis not present

## 2016-05-02 DIAGNOSIS — E1129 Type 2 diabetes mellitus with other diabetic kidney complication: Secondary | ICD-10-CM | POA: Diagnosis not present

## 2016-05-05 DIAGNOSIS — E1129 Type 2 diabetes mellitus with other diabetic kidney complication: Secondary | ICD-10-CM | POA: Diagnosis not present

## 2016-05-05 DIAGNOSIS — N2581 Secondary hyperparathyroidism of renal origin: Secondary | ICD-10-CM | POA: Diagnosis not present

## 2016-05-05 DIAGNOSIS — N186 End stage renal disease: Secondary | ICD-10-CM | POA: Diagnosis not present

## 2016-05-05 DIAGNOSIS — D509 Iron deficiency anemia, unspecified: Secondary | ICD-10-CM | POA: Diagnosis not present

## 2016-05-06 DIAGNOSIS — Z7901 Long term (current) use of anticoagulants: Secondary | ICD-10-CM | POA: Diagnosis not present

## 2016-05-07 DIAGNOSIS — E1129 Type 2 diabetes mellitus with other diabetic kidney complication: Secondary | ICD-10-CM | POA: Diagnosis not present

## 2016-05-07 DIAGNOSIS — N186 End stage renal disease: Secondary | ICD-10-CM | POA: Diagnosis not present

## 2016-05-07 DIAGNOSIS — N2581 Secondary hyperparathyroidism of renal origin: Secondary | ICD-10-CM | POA: Diagnosis not present

## 2016-05-07 DIAGNOSIS — D509 Iron deficiency anemia, unspecified: Secondary | ICD-10-CM | POA: Diagnosis not present

## 2016-05-09 DIAGNOSIS — D509 Iron deficiency anemia, unspecified: Secondary | ICD-10-CM | POA: Diagnosis not present

## 2016-05-09 DIAGNOSIS — N2581 Secondary hyperparathyroidism of renal origin: Secondary | ICD-10-CM | POA: Diagnosis not present

## 2016-05-09 DIAGNOSIS — E1129 Type 2 diabetes mellitus with other diabetic kidney complication: Secondary | ICD-10-CM | POA: Diagnosis not present

## 2016-05-09 DIAGNOSIS — N186 End stage renal disease: Secondary | ICD-10-CM | POA: Diagnosis not present

## 2016-05-11 DIAGNOSIS — M25571 Pain in right ankle and joints of right foot: Secondary | ICD-10-CM | POA: Diagnosis not present

## 2016-05-11 DIAGNOSIS — G5601 Carpal tunnel syndrome, right upper limb: Secondary | ICD-10-CM | POA: Diagnosis not present

## 2016-05-11 DIAGNOSIS — M79641 Pain in right hand: Secondary | ICD-10-CM | POA: Diagnosis not present

## 2016-05-11 DIAGNOSIS — Z4789 Encounter for other orthopedic aftercare: Secondary | ICD-10-CM | POA: Diagnosis not present

## 2016-05-11 DIAGNOSIS — M65311 Trigger thumb, right thumb: Secondary | ICD-10-CM | POA: Diagnosis not present

## 2016-05-12 DIAGNOSIS — N2581 Secondary hyperparathyroidism of renal origin: Secondary | ICD-10-CM | POA: Diagnosis not present

## 2016-05-12 DIAGNOSIS — D509 Iron deficiency anemia, unspecified: Secondary | ICD-10-CM | POA: Diagnosis not present

## 2016-05-12 DIAGNOSIS — E1129 Type 2 diabetes mellitus with other diabetic kidney complication: Secondary | ICD-10-CM | POA: Diagnosis not present

## 2016-05-12 DIAGNOSIS — N186 End stage renal disease: Secondary | ICD-10-CM | POA: Diagnosis not present

## 2016-05-13 DIAGNOSIS — Z992 Dependence on renal dialysis: Secondary | ICD-10-CM | POA: Diagnosis not present

## 2016-05-13 DIAGNOSIS — N186 End stage renal disease: Secondary | ICD-10-CM | POA: Diagnosis not present

## 2016-05-13 DIAGNOSIS — E1129 Type 2 diabetes mellitus with other diabetic kidney complication: Secondary | ICD-10-CM | POA: Diagnosis not present

## 2016-05-13 DIAGNOSIS — Z7901 Long term (current) use of anticoagulants: Secondary | ICD-10-CM | POA: Diagnosis not present

## 2016-05-14 DIAGNOSIS — E1129 Type 2 diabetes mellitus with other diabetic kidney complication: Secondary | ICD-10-CM | POA: Diagnosis not present

## 2016-05-14 DIAGNOSIS — D631 Anemia in chronic kidney disease: Secondary | ICD-10-CM | POA: Diagnosis not present

## 2016-05-14 DIAGNOSIS — D509 Iron deficiency anemia, unspecified: Secondary | ICD-10-CM | POA: Diagnosis not present

## 2016-05-14 DIAGNOSIS — N2581 Secondary hyperparathyroidism of renal origin: Secondary | ICD-10-CM | POA: Diagnosis not present

## 2016-05-14 DIAGNOSIS — N186 End stage renal disease: Secondary | ICD-10-CM | POA: Diagnosis not present

## 2016-05-16 DIAGNOSIS — D509 Iron deficiency anemia, unspecified: Secondary | ICD-10-CM | POA: Diagnosis not present

## 2016-05-16 DIAGNOSIS — E1129 Type 2 diabetes mellitus with other diabetic kidney complication: Secondary | ICD-10-CM | POA: Diagnosis not present

## 2016-05-16 DIAGNOSIS — N2581 Secondary hyperparathyroidism of renal origin: Secondary | ICD-10-CM | POA: Diagnosis not present

## 2016-05-16 DIAGNOSIS — N186 End stage renal disease: Secondary | ICD-10-CM | POA: Diagnosis not present

## 2016-05-16 DIAGNOSIS — D631 Anemia in chronic kidney disease: Secondary | ICD-10-CM | POA: Diagnosis not present

## 2016-05-18 DIAGNOSIS — E119 Type 2 diabetes mellitus without complications: Secondary | ICD-10-CM | POA: Diagnosis not present

## 2016-05-18 DIAGNOSIS — I69954 Hemiplegia and hemiparesis following unspecified cerebrovascular disease affecting left non-dominant side: Secondary | ICD-10-CM | POA: Diagnosis not present

## 2016-05-18 DIAGNOSIS — M1990 Unspecified osteoarthritis, unspecified site: Secondary | ICD-10-CM | POA: Diagnosis not present

## 2016-05-18 DIAGNOSIS — G2581 Restless legs syndrome: Secondary | ICD-10-CM | POA: Diagnosis not present

## 2016-05-18 DIAGNOSIS — I509 Heart failure, unspecified: Secondary | ICD-10-CM | POA: Diagnosis not present

## 2016-05-18 DIAGNOSIS — I10 Essential (primary) hypertension: Secondary | ICD-10-CM | POA: Diagnosis not present

## 2016-05-18 DIAGNOSIS — M25569 Pain in unspecified knee: Secondary | ICD-10-CM | POA: Diagnosis not present

## 2016-05-18 DIAGNOSIS — H409 Unspecified glaucoma: Secondary | ICD-10-CM | POA: Diagnosis not present

## 2016-05-19 DIAGNOSIS — N186 End stage renal disease: Secondary | ICD-10-CM | POA: Diagnosis not present

## 2016-05-19 DIAGNOSIS — D509 Iron deficiency anemia, unspecified: Secondary | ICD-10-CM | POA: Diagnosis not present

## 2016-05-19 DIAGNOSIS — N2581 Secondary hyperparathyroidism of renal origin: Secondary | ICD-10-CM | POA: Diagnosis not present

## 2016-05-19 DIAGNOSIS — E1129 Type 2 diabetes mellitus with other diabetic kidney complication: Secondary | ICD-10-CM | POA: Diagnosis not present

## 2016-05-19 DIAGNOSIS — D631 Anemia in chronic kidney disease: Secondary | ICD-10-CM | POA: Diagnosis not present

## 2016-05-20 DIAGNOSIS — Z7901 Long term (current) use of anticoagulants: Secondary | ICD-10-CM | POA: Diagnosis not present

## 2016-05-21 DIAGNOSIS — R627 Adult failure to thrive: Secondary | ICD-10-CM | POA: Diagnosis not present

## 2016-05-21 DIAGNOSIS — D631 Anemia in chronic kidney disease: Secondary | ICD-10-CM | POA: Diagnosis not present

## 2016-05-21 DIAGNOSIS — E1129 Type 2 diabetes mellitus with other diabetic kidney complication: Secondary | ICD-10-CM | POA: Diagnosis not present

## 2016-05-21 DIAGNOSIS — N186 End stage renal disease: Secondary | ICD-10-CM | POA: Diagnosis not present

## 2016-05-21 DIAGNOSIS — N2581 Secondary hyperparathyroidism of renal origin: Secondary | ICD-10-CM | POA: Diagnosis not present

## 2016-05-21 DIAGNOSIS — D509 Iron deficiency anemia, unspecified: Secondary | ICD-10-CM | POA: Diagnosis not present

## 2016-05-23 DIAGNOSIS — E1129 Type 2 diabetes mellitus with other diabetic kidney complication: Secondary | ICD-10-CM | POA: Diagnosis not present

## 2016-05-23 DIAGNOSIS — D509 Iron deficiency anemia, unspecified: Secondary | ICD-10-CM | POA: Diagnosis not present

## 2016-05-23 DIAGNOSIS — N186 End stage renal disease: Secondary | ICD-10-CM | POA: Diagnosis not present

## 2016-05-23 DIAGNOSIS — D631 Anemia in chronic kidney disease: Secondary | ICD-10-CM | POA: Diagnosis not present

## 2016-05-23 DIAGNOSIS — N2581 Secondary hyperparathyroidism of renal origin: Secondary | ICD-10-CM | POA: Diagnosis not present

## 2016-05-26 DIAGNOSIS — N186 End stage renal disease: Secondary | ICD-10-CM | POA: Diagnosis not present

## 2016-05-26 DIAGNOSIS — D631 Anemia in chronic kidney disease: Secondary | ICD-10-CM | POA: Diagnosis not present

## 2016-05-26 DIAGNOSIS — N2581 Secondary hyperparathyroidism of renal origin: Secondary | ICD-10-CM | POA: Diagnosis not present

## 2016-05-26 DIAGNOSIS — E1129 Type 2 diabetes mellitus with other diabetic kidney complication: Secondary | ICD-10-CM | POA: Diagnosis not present

## 2016-05-26 DIAGNOSIS — D509 Iron deficiency anemia, unspecified: Secondary | ICD-10-CM | POA: Diagnosis not present

## 2016-05-27 DIAGNOSIS — E119 Type 2 diabetes mellitus without complications: Secondary | ICD-10-CM | POA: Diagnosis not present

## 2016-05-27 DIAGNOSIS — Z79899 Other long term (current) drug therapy: Secondary | ICD-10-CM | POA: Diagnosis not present

## 2016-05-28 DIAGNOSIS — N186 End stage renal disease: Secondary | ICD-10-CM | POA: Diagnosis not present

## 2016-05-28 DIAGNOSIS — N2581 Secondary hyperparathyroidism of renal origin: Secondary | ICD-10-CM | POA: Diagnosis not present

## 2016-05-28 DIAGNOSIS — D631 Anemia in chronic kidney disease: Secondary | ICD-10-CM | POA: Diagnosis not present

## 2016-05-28 DIAGNOSIS — E1129 Type 2 diabetes mellitus with other diabetic kidney complication: Secondary | ICD-10-CM | POA: Diagnosis not present

## 2016-05-28 DIAGNOSIS — D509 Iron deficiency anemia, unspecified: Secondary | ICD-10-CM | POA: Diagnosis not present

## 2016-05-30 DIAGNOSIS — N2581 Secondary hyperparathyroidism of renal origin: Secondary | ICD-10-CM | POA: Diagnosis not present

## 2016-05-30 DIAGNOSIS — E1129 Type 2 diabetes mellitus with other diabetic kidney complication: Secondary | ICD-10-CM | POA: Diagnosis not present

## 2016-05-30 DIAGNOSIS — D631 Anemia in chronic kidney disease: Secondary | ICD-10-CM | POA: Diagnosis not present

## 2016-05-30 DIAGNOSIS — D509 Iron deficiency anemia, unspecified: Secondary | ICD-10-CM | POA: Diagnosis not present

## 2016-05-30 DIAGNOSIS — N186 End stage renal disease: Secondary | ICD-10-CM | POA: Diagnosis not present

## 2016-06-01 DIAGNOSIS — E119 Type 2 diabetes mellitus without complications: Secondary | ICD-10-CM | POA: Diagnosis not present

## 2016-06-01 DIAGNOSIS — G2581 Restless legs syndrome: Secondary | ICD-10-CM | POA: Diagnosis not present

## 2016-06-01 DIAGNOSIS — I10 Essential (primary) hypertension: Secondary | ICD-10-CM | POA: Diagnosis not present

## 2016-06-01 DIAGNOSIS — N186 End stage renal disease: Secondary | ICD-10-CM | POA: Diagnosis not present

## 2016-06-01 DIAGNOSIS — Z86718 Personal history of other venous thrombosis and embolism: Secondary | ICD-10-CM | POA: Diagnosis not present

## 2016-06-01 DIAGNOSIS — M25569 Pain in unspecified knee: Secondary | ICD-10-CM | POA: Diagnosis not present

## 2016-06-01 DIAGNOSIS — M1990 Unspecified osteoarthritis, unspecified site: Secondary | ICD-10-CM | POA: Diagnosis not present

## 2016-06-01 DIAGNOSIS — I509 Heart failure, unspecified: Secondary | ICD-10-CM | POA: Diagnosis not present

## 2016-06-02 DIAGNOSIS — E1129 Type 2 diabetes mellitus with other diabetic kidney complication: Secondary | ICD-10-CM | POA: Diagnosis not present

## 2016-06-02 DIAGNOSIS — N2581 Secondary hyperparathyroidism of renal origin: Secondary | ICD-10-CM | POA: Diagnosis not present

## 2016-06-02 DIAGNOSIS — N186 End stage renal disease: Secondary | ICD-10-CM | POA: Diagnosis not present

## 2016-06-02 DIAGNOSIS — D509 Iron deficiency anemia, unspecified: Secondary | ICD-10-CM | POA: Diagnosis not present

## 2016-06-02 DIAGNOSIS — D631 Anemia in chronic kidney disease: Secondary | ICD-10-CM | POA: Diagnosis not present

## 2016-06-04 DIAGNOSIS — E1129 Type 2 diabetes mellitus with other diabetic kidney complication: Secondary | ICD-10-CM | POA: Diagnosis not present

## 2016-06-04 DIAGNOSIS — D631 Anemia in chronic kidney disease: Secondary | ICD-10-CM | POA: Diagnosis not present

## 2016-06-04 DIAGNOSIS — N2581 Secondary hyperparathyroidism of renal origin: Secondary | ICD-10-CM | POA: Diagnosis not present

## 2016-06-04 DIAGNOSIS — D509 Iron deficiency anemia, unspecified: Secondary | ICD-10-CM | POA: Diagnosis not present

## 2016-06-04 DIAGNOSIS — N186 End stage renal disease: Secondary | ICD-10-CM | POA: Diagnosis not present

## 2016-06-06 DIAGNOSIS — D631 Anemia in chronic kidney disease: Secondary | ICD-10-CM | POA: Diagnosis not present

## 2016-06-06 DIAGNOSIS — N186 End stage renal disease: Secondary | ICD-10-CM | POA: Diagnosis not present

## 2016-06-06 DIAGNOSIS — D509 Iron deficiency anemia, unspecified: Secondary | ICD-10-CM | POA: Diagnosis not present

## 2016-06-06 DIAGNOSIS — E1129 Type 2 diabetes mellitus with other diabetic kidney complication: Secondary | ICD-10-CM | POA: Diagnosis not present

## 2016-06-06 DIAGNOSIS — N2581 Secondary hyperparathyroidism of renal origin: Secondary | ICD-10-CM | POA: Diagnosis not present

## 2016-06-09 DIAGNOSIS — D509 Iron deficiency anemia, unspecified: Secondary | ICD-10-CM | POA: Diagnosis not present

## 2016-06-09 DIAGNOSIS — D631 Anemia in chronic kidney disease: Secondary | ICD-10-CM | POA: Diagnosis not present

## 2016-06-09 DIAGNOSIS — N2581 Secondary hyperparathyroidism of renal origin: Secondary | ICD-10-CM | POA: Diagnosis not present

## 2016-06-09 DIAGNOSIS — N186 End stage renal disease: Secondary | ICD-10-CM | POA: Diagnosis not present

## 2016-06-09 DIAGNOSIS — E1129 Type 2 diabetes mellitus with other diabetic kidney complication: Secondary | ICD-10-CM | POA: Diagnosis not present

## 2016-06-10 DIAGNOSIS — B351 Tinea unguium: Secondary | ICD-10-CM | POA: Diagnosis not present

## 2016-06-10 DIAGNOSIS — R262 Difficulty in walking, not elsewhere classified: Secondary | ICD-10-CM | POA: Diagnosis not present

## 2016-06-10 DIAGNOSIS — L600 Ingrowing nail: Secondary | ICD-10-CM | POA: Diagnosis not present

## 2016-06-10 DIAGNOSIS — M79673 Pain in unspecified foot: Secondary | ICD-10-CM | POA: Diagnosis not present

## 2016-06-11 DIAGNOSIS — E1129 Type 2 diabetes mellitus with other diabetic kidney complication: Secondary | ICD-10-CM | POA: Diagnosis not present

## 2016-06-11 DIAGNOSIS — N186 End stage renal disease: Secondary | ICD-10-CM | POA: Diagnosis not present

## 2016-06-11 DIAGNOSIS — D631 Anemia in chronic kidney disease: Secondary | ICD-10-CM | POA: Diagnosis not present

## 2016-06-11 DIAGNOSIS — N2581 Secondary hyperparathyroidism of renal origin: Secondary | ICD-10-CM | POA: Diagnosis not present

## 2016-06-11 DIAGNOSIS — D509 Iron deficiency anemia, unspecified: Secondary | ICD-10-CM | POA: Diagnosis not present

## 2016-06-13 DIAGNOSIS — E1129 Type 2 diabetes mellitus with other diabetic kidney complication: Secondary | ICD-10-CM | POA: Diagnosis not present

## 2016-06-13 DIAGNOSIS — D631 Anemia in chronic kidney disease: Secondary | ICD-10-CM | POA: Diagnosis not present

## 2016-06-13 DIAGNOSIS — N186 End stage renal disease: Secondary | ICD-10-CM | POA: Diagnosis not present

## 2016-06-13 DIAGNOSIS — Z992 Dependence on renal dialysis: Secondary | ICD-10-CM | POA: Diagnosis not present

## 2016-06-13 DIAGNOSIS — D509 Iron deficiency anemia, unspecified: Secondary | ICD-10-CM | POA: Diagnosis not present

## 2016-06-13 DIAGNOSIS — N2581 Secondary hyperparathyroidism of renal origin: Secondary | ICD-10-CM | POA: Diagnosis not present

## 2016-06-16 DIAGNOSIS — E1129 Type 2 diabetes mellitus with other diabetic kidney complication: Secondary | ICD-10-CM | POA: Diagnosis not present

## 2016-06-16 DIAGNOSIS — D509 Iron deficiency anemia, unspecified: Secondary | ICD-10-CM | POA: Diagnosis not present

## 2016-06-16 DIAGNOSIS — N2581 Secondary hyperparathyroidism of renal origin: Secondary | ICD-10-CM | POA: Diagnosis not present

## 2016-06-16 DIAGNOSIS — D631 Anemia in chronic kidney disease: Secondary | ICD-10-CM | POA: Diagnosis not present

## 2016-06-16 DIAGNOSIS — N186 End stage renal disease: Secondary | ICD-10-CM | POA: Diagnosis not present

## 2016-06-18 ENCOUNTER — Emergency Department (HOSPITAL_COMMUNITY): Payer: Medicare Other

## 2016-06-18 ENCOUNTER — Emergency Department (HOSPITAL_COMMUNITY)
Admission: EM | Admit: 2016-06-18 | Discharge: 2016-06-18 | Disposition: A | Discharge status: Home / Self Care | Payer: Medicare Other | Attending: Emergency Medicine | Admitting: Emergency Medicine

## 2016-06-18 ENCOUNTER — Encounter (HOSPITAL_COMMUNITY): Payer: Self-pay | Admitting: Emergency Medicine

## 2016-06-18 DIAGNOSIS — E1122 Type 2 diabetes mellitus with diabetic chronic kidney disease: Secondary | ICD-10-CM | POA: Diagnosis not present

## 2016-06-18 DIAGNOSIS — I132 Hypertensive heart and chronic kidney disease with heart failure and with stage 5 chronic kidney disease, or end stage renal disease: Secondary | ICD-10-CM | POA: Insufficient documentation

## 2016-06-18 DIAGNOSIS — E1140 Type 2 diabetes mellitus with diabetic neuropathy, unspecified: Secondary | ICD-10-CM | POA: Diagnosis not present

## 2016-06-18 DIAGNOSIS — K5900 Constipation, unspecified: Secondary | ICD-10-CM | POA: Diagnosis not present

## 2016-06-18 DIAGNOSIS — Z87891 Personal history of nicotine dependence: Secondary | ICD-10-CM | POA: Insufficient documentation

## 2016-06-18 DIAGNOSIS — N186 End stage renal disease: Secondary | ICD-10-CM | POA: Diagnosis not present

## 2016-06-18 DIAGNOSIS — Z7984 Long term (current) use of oral hypoglycemic drugs: Secondary | ICD-10-CM | POA: Insufficient documentation

## 2016-06-18 DIAGNOSIS — I2510 Atherosclerotic heart disease of native coronary artery without angina pectoris: Secondary | ICD-10-CM | POA: Diagnosis not present

## 2016-06-18 DIAGNOSIS — R109 Unspecified abdominal pain: Secondary | ICD-10-CM | POA: Diagnosis not present

## 2016-06-18 DIAGNOSIS — I5042 Chronic combined systolic (congestive) and diastolic (congestive) heart failure: Secondary | ICD-10-CM | POA: Diagnosis not present

## 2016-06-18 DIAGNOSIS — Z8673 Personal history of transient ischemic attack (TIA), and cerebral infarction without residual deficits: Secondary | ICD-10-CM | POA: Insufficient documentation

## 2016-06-18 DIAGNOSIS — R100 Acute abdomen: Secondary | ICD-10-CM | POA: Diagnosis not present

## 2016-06-18 LAB — POC OCCULT BLOOD, ED: FECAL OCCULT BLD: NEGATIVE

## 2016-06-18 LAB — CBC WITH DIFFERENTIAL/PLATELET
Basophils Absolute: 0 10*3/uL (ref 0.0–0.1)
Basophils Relative: 1 %
EOS ABS: 0.1 10*3/uL (ref 0.0–0.7)
EOS PCT: 2 %
HCT: 30.1 % — ABNORMAL LOW (ref 36.0–46.0)
HEMOGLOBIN: 9.3 g/dL — AB (ref 12.0–15.0)
LYMPHS ABS: 1.2 10*3/uL (ref 0.7–4.0)
Lymphocytes Relative: 36 %
MCH: 30.4 pg (ref 26.0–34.0)
MCHC: 30.9 g/dL (ref 30.0–36.0)
MCV: 98.4 fL (ref 78.0–100.0)
MONO ABS: 0.3 10*3/uL (ref 0.1–1.0)
MONOS PCT: 11 %
NEUTROS PCT: 50 %
Neutro Abs: 1.7 10*3/uL (ref 1.7–7.7)
Platelets: 133 10*3/uL — ABNORMAL LOW (ref 150–400)
RBC: 3.06 MIL/uL — ABNORMAL LOW (ref 3.87–5.11)
RDW: 17.3 % — AB (ref 11.5–15.5)
WBC: 3.3 10*3/uL — ABNORMAL LOW (ref 4.0–10.5)

## 2016-06-18 LAB — COMPREHENSIVE METABOLIC PANEL
ALK PHOS: 93 U/L (ref 38–126)
ALT: 12 U/L — AB (ref 14–54)
ANION GAP: 13 (ref 5–15)
AST: 19 U/L (ref 15–41)
Albumin: 3.5 g/dL (ref 3.5–5.0)
BUN: 27 mg/dL — ABNORMAL HIGH (ref 6–20)
CALCIUM: 10.3 mg/dL (ref 8.9–10.3)
CHLORIDE: 92 mmol/L — AB (ref 101–111)
CO2: 32 mmol/L (ref 22–32)
CREATININE: 7.31 mg/dL — AB (ref 0.44–1.00)
GFR, EST AFRICAN AMERICAN: 6 mL/min — AB (ref >60)
GFR, EST NON AFRICAN AMERICAN: 5 mL/min — AB (ref >60)
Glucose, Bld: 118 mg/dL — ABNORMAL HIGH (ref 65–99)
Potassium: 4.2 mmol/L (ref 3.5–5.1)
Sodium: 137 mmol/L (ref 135–145)
Total Bilirubin: 0.6 mg/dL (ref 0.3–1.2)
Total Protein: 6.3 g/dL — ABNORMAL LOW (ref 6.5–8.1)

## 2016-06-18 LAB — CBC
HCT: 28.4 % — ABNORMAL LOW (ref 36.0–46.0)
Hemoglobin: 8.8 g/dL — ABNORMAL LOW (ref 12.0–15.0)
MCH: 30.4 pg (ref 26.0–34.0)
MCHC: 31 g/dL (ref 30.0–36.0)
MCV: 98.3 fL (ref 78.0–100.0)
PLATELETS: 130 10*3/uL — AB (ref 150–400)
RBC: 2.89 MIL/uL — ABNORMAL LOW (ref 3.87–5.11)
RDW: 17.3 % — ABNORMAL HIGH (ref 11.5–15.5)
WBC: 3.3 10*3/uL — AB (ref 4.0–10.5)

## 2016-06-18 LAB — LIPASE, BLOOD: LIPASE: 16 U/L (ref 11–51)

## 2016-06-18 LAB — PROTIME-INR
INR: 1.06
PROTHROMBIN TIME: 13.8 s (ref 11.4–15.2)

## 2016-06-18 NOTE — ED Notes (Signed)
3 quarter size firm to hard ball of stool from rectum.

## 2016-06-18 NOTE — ED Provider Notes (Signed)
[facility] DEPT Provider Note   CSN: [id number] Arrival date & time: 06/18/16  0546     History   Chief Complaint Chief Complaint  Patient presents with  . Abdominal Pain    HPI Stefanie [name] is a 71 y.o. female.  The history is provided by the patient and medical records.  Abdominal Pain   Associated symptoms include constipation.    71 year old female with history of anemia, coronary artery disease, congestive heart failure, prior stroke, diabetes, end-stage renal disease on hemodialysis, hyperlipidemia, hypertension, presenting to the ED for abdominal pain. Patient reports this is been ongoing for the past week now. Reports she's not had a bowel movement in about 6 or 7 days. States her abdomen feels bloated. She has been given laxatives multiple times from her facility, however has still not been able to produce BM. She does not make urine, has been on dialysis for 12 years now.  She denies any fever or chills. No nausea or vomiting.  She does report early satiety.  Prior abdominal surgeries include tubal ligation.  Dialysis status Tues, Thurs, Sat.  Past Medical History:  Diagnosis Date  . Anemia   . CAD (coronary artery disease)   . CHF (congestive heart failure) ([place])   . Constipation   . CVA (cerebral vascular accident) ([place]) [place]   left sided weakness  . Diabetes mellitus   . End stage renal disease ([place])    initiated HD 2006  . Hx of cardiovascular stress test    a. MV 03/2010:  no ischemia, EF 55%  . Hx of echocardiogram    a. Echo 03/2010: EF 55-60%, Gr 1 diast dysfn, trivial MS, mild to mod LAE, PASP 34, ;  b. Echo 11/13: mild LVH, EF 60%, Gr 1 diast dysfn, Ao sclerosis, no AS, MAC, slight MS, mean 3 mmHg, PASP 34  . Hyperlipidemia   . Hypertension   . Protein malnutrition ([place])   . Renal insufficiency   . Retinal detachment, bilateral   . Secondary hyperparathyroidism [facility])     Patient Active Problem List   Diagnosis Date Noted  . Acute blood  loss anemia 02/09/2016  . Hypotension due to blood loss 02/09/2016  . Chronic combined systolic and diastolic CHF (congestive heart failure) ([place]) 02/09/2016  . Hemorrhage 02/08/2016  . Anemia 02/08/2016  . Left leg pain 04/10/2015  . Bacteremia 04/10/2015  . Lactic acidosis   . Cellulitis 04/09/2015  . History of CVA with residual deficit 02/11/2015  . Debility 02/08/2015  . DVT (deep venous thrombosis) ([place])   . CHF (congestive heart failure) ([place])   . Chronic diastolic congestive heart failure ([place])   . ESRD (end stage renal disease) ([place])   . Congestive dilated cardiomyopathy ([place])   . Left leg DVT ([place]) 02/01/2015  . Leukocytosis 02/01/2015  . Elevated troponin 02/01/2015  . SOB (shortness of breath) 02/01/2015  . Type 2 diabetes, controlled, with neuropathy ([place]) 12/20/2014  . Pain due to onychomycosis of toenail 12/20/2014  . 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 Mechanical complication of other vascular device, implant, and graft 05/19/2013  . PAD (peripheral artery disease) ([place]) 02/24/2012  . Murmur 12/24/2011  . Bradycardia-intermittent sinus 07/10/2010  . DYSPNEA ON EXERTION 02/25/2010  . CHEST PAIN, EXERTIONAL 02/25/2010  . DM 02/24/2010  . PROTEIN MALNUTRITION 02/24/2010  . HLD (hyperlipidemia) 02/24/2010  . ANEMIA 02/24/2010  . Essential hypertension 02/24/2010  . Cerebral artery occlusion with cerebral infarction ([place]) 02/24/2010  . ESRD on dialysis (  [place]) 02/24/2010  . Secondary renal hyperparathyroidism ([place]) 02/24/2010    Past Surgical History:  Procedure Laterality Date  . ARTERIOVENOUS GRAFT PLACEMENT     BUE numerous times  . ARTERIOVENOUS GRAFT PLACEMENT  11/13/10   Lt femoral - Dr. Kellie Simmering  . CARDIAC CATHETERIZATION N/A 02/05/2015   Procedure: Left Heart Cath and Coronary Angiography;  Surgeon: Jettie Booze, MD;  Location: [facility];  Service: Cardiovascular;  Laterality: N/A;  . CATARACT EXTRACTION    . DG AV DIALYSIS GRAFT  DECLOT OR  06/06/11, 08/26/11   performed in IR  . PERIPHERAL VASCULAR CATHETERIZATION N/A 02/06/2015   Procedure: A/V Shuntogram;  Surgeon: Elam Dutch, MD;  Location: [facility];  Service: Cardiovascular;  Laterality: N/A;  . PERIPHERAL VASCULAR CATHETERIZATION Left 02/26/2016   Procedure: A/V Shuntogram;  Surgeon: Waynetta Sandy, MD;  Location: [facility];  Service: Cardiovascular;  Laterality: Left;  . PERIPHERAL VASCULAR CATHETERIZATION Left 02/26/2016   Procedure: Peripheral Vascular Balloon Angioplasty;  Surgeon: Waynetta Sandy, MD;  Location: [facility];  Service: Cardiovascular;  Laterality: Left;  Lt thigh avf  . SHUNTOGRAM N/A 05/29/2013   Procedure: Earney Mallet;  Surgeon: Conrad [place], MD;  Location: [facility] CATH LAB;  Service: Cardiovascular;  Laterality: N/A;  . SHUNTOGRAM Left 03/01/2014   Procedure: Earney Mallet;  Surgeon: Conrad [place], MD;  Location: [facility] CATH LAB;  Service: Cardiovascular;  Laterality: Left;  . TUBAL LIGATION      OB History    No data available       Home Medications    Prior to Admission medications   Medication Sig Start Date End Date Taking? Authorizing Provider  acetaminophen (TYLENOL) 325 MG tablet Take 650 mg by mouth 2 (two) times daily.   Yes Historical Provider, MD  calcium acetate (PHOSLO) 667 MG capsule Take 667-2,001 mg by mouth See admin instructions. Take 3 capsules three times a day with meals and take 1 capsule twice daily with snacks   Yes Historical Provider, MD  diclofenac sodium (VOLTAREN) 1 % GEL Apply 2 g topically See admin instructions. Apply to both knees daily as needed for pain and to right hand 2-3 times a day as needed for pain (BRAND NAME VOLTAREN)   Yes Historical Provider, MD  glipiZIDE (GLUCOTROL) 10 MG tablet Take 10 mg by mouth daily before breakfast.   Yes Historical Provider, MD  latanoprost (XALATAN) 0.005 % ophthalmic solution Place 1 drop into the left eye at bedtime.   Yes  Historical Provider, MD  lidocaine-prilocaine (EMLA) cream Apply 1 application topically as needed (topical anesthesia for hemodialysis if Gebauers and Lidocaine injection are ineffective.). Patient taking differently: Apply 1 application topically 3 (three) times a week.  02/25/15  Yes Ivan Anchors Love, PA-C  lisinopril (PRINIVIL,ZESTRIL) 30 MG tablet Take 30 mg by mouth every morning.    Yes Historical Provider, MD  loratadine (CLARITIN) 10 MG tablet Take 10 mg by mouth daily.   Yes Historical Provider, MD  oxyCODONE (OXY IR/ROXICODONE) 5 MG immediate release tablet Take 2.5 mg by mouth 2 (two) times daily.    Yes Historical Provider, MD  polyethylene glycol (MIRALAX) packet Use 17 gms three times daily until bowels move, with maximum of 3 consecutive days Patient taking differently: Take 17 g by mouth See admin instructions. On Tuesday, Thursday and Saturday as needed 09/02/15  Yes Shari Upstill, PA-C  pravastatin (PRAVACHOL) 20 MG tablet Take 20 mg by mouth at bedtime.  Yes Historical Provider, MD  rOPINIRole (REQUIP) 0.5 MG tablet Take 0.5 mg by mouth at bedtime.   Yes Historical Provider, MD  sennosides-docusate sodium (SENOKOT-S) 8.6-50 MG tablet Take 2 tablets by mouth daily as needed for constipation.    Yes Historical Provider, MD  sevelamer carbonate (RENVELA) 800 MG tablet Take 2,400-4,800 mg by mouth See admin instructions. Take 4800 mg three times a day with meals and 2400 mg twice daily with snacks   Yes Historical Provider, MD  sorbitol 70 % solution Take 30 mLs by mouth daily as needed (for constipation).   Yes Historical Provider, MD    Family History Family History  Problem Relation Age of Onset  . Heart disease Mother   . Diabetes Mother   . Hypertension Mother   . Heart attack Mother   . Diabetes Other   . Kidney disease Other   . Cardiomyopathy    . Diabetes Daughter   . Diabetes Son   . Hypertension Son     Social History Social History  Substance Use Topics  .  Smoking status: Former Smoker    Quit date: 03/17/1983  . Smokeless tobacco: Never Used  . Alcohol use No     Allergies   Patient has no known allergies.   Review of Systems Review of Systems  Gastrointestinal: Positive for abdominal pain and constipation.  All other systems reviewed and are negative.    Physical Exam Updated Vital Signs BP (!) 158/58   Pulse 68   Temp 98.2 F (36.8 C) (Oral)   Resp 18   SpO2 100%   Physical Exam  Constitutional: She is oriented to person, place, and time. She appears well-developed and well-nourished.  HENT:  Head: Normocephalic and atraumatic.  Mouth/Throat: Oropharynx is clear and moist.  Eyes: Conjunctivae and EOM are normal. Pupils are equal, round, and reactive to light.  Neck: Normal range of motion.  Cardiovascular: Normal rate, regular rhythm and normal heart sounds.   Pulmonary/Chest: Effort normal and breath sounds normal.  Abdominal: Soft. Bowel sounds are normal. There is tenderness.  Mild tenderness in the left lower abdomen, she reports it feels "hard", no significant distention, bowel sounds are normal  Musculoskeletal: Normal range of motion.  Neurological: She is alert and oriented to person, place, and time.  Skin: Skin is warm and dry.  Psychiatric: She has a normal mood and affect.  Nursing note and vitals reviewed.    ED Treatments / Results  Labs (all labs ordered are listed, but only abnormal results are displayed) Labs Reviewed  CBC WITH DIFFERENTIAL/PLATELET - Abnormal; Notable for the following:       Result Value   WBC 3.3 (*)    RBC 3.06 (*)    Hemoglobin 9.3 (*)    HCT 30.1 (*)    RDW 17.3 (*)    Platelets 133 (*)    All other components within normal limits  COMPREHENSIVE METABOLIC PANEL - Abnormal; Notable for the following:    Chloride 92 (*)    Glucose, Bld 118 (*)    BUN 27 (*)    Creatinine, Ser 7.31 (*)    Total Protein 6.3 (*)    ALT 12 (*)    GFR calc non Af Amer 5 (*)    GFR calc  Af Amer 6 (*)    All other components within normal limits  CBC - Abnormal; Notable for the following:    WBC 3.3 (*)    RBC 2.89 (*)  Hemoglobin 8.8 (*)    HCT 28.4 (*)    RDW 17.3 (*)    Platelets 130 (*)    All other components within normal limits  LIPASE, BLOOD  PROTIME-INR  POC OCCULT BLOOD, ED    EKG  EKG Interpretation None       Radiology Ct Abdomen Pelvis Wo Contrast  Result Date: 06/18/2016 CLINICAL DATA:  Lower abdominal pain, chronic constipation EXAM: CT ABDOMEN AND PELVIS WITHOUT CONTRAST TECHNIQUE: Multidetector CT imaging of the abdomen and pelvis was performed following the standard protocol without IV contrast. COMPARISON:  CT scan 09/02/2015 FINDINGS: Lower chest: Lung bases shows no acute findings. Cardiomegaly again noted. Mitral valve calcifications. Hepatobiliary: Unenhanced liver shows no biliary ductal dilatation. No calcified gallstones are noted within gallbladder. Pancreas: Unenhanced pancreas without focal abnormality. Spleen: Unenhanced spleen with normal appearance. Adrenals/Urinary Tract: No adrenal gland mass. Again noted bilateral atrophic kidneys. Heterogeneous low density lesions bilateral kidney may represent small cysts however cannot be characterized without IV contrast. This are grossly stable from prior exam. The largest lesion in midpole posterior aspect of the right kidney measures 1.3 cm. The largest exophytic lesion midpole of the left kidney measures 7 mm. Punctate bilateral nonobstructive renal calcifications are stable. Vascular calcification bilateral renal hilum again noted. The urinary bladder is empty collapsed limiting its assessment. Stomach/Bowel: There is no small bowel obstruction. No thickened or dilated small bowel loops. Moderate stool noted within right colon. No pericecal inflammation. Moderate stool noted within cecum. The appendix is not identified. The terminal ileum is unremarkable. Moderate stool noted within transverse  colon. Some colonic stool noted within descending colon. Moderate stool noted within sigmoid colon. No evidence of colitis or diverticulitis. Moderate stool noted within rectum. The rectum measures 5.3 cm in diameter. Minimal fecal impaction cannot be excluded. Vascular/Lymphatic: Extensive atherosclerotic calcifications of abdominal aorta, bilateral renal artery. SMA, bilateral renal artery origin, bilateral iliac arteries and femoral arteries are again noted. Stable vascular stent in left iliac vein. No aortic aneurysm. No retroperitoneal or mesenteric adenopathy. Reproductive: The uterus is atrophic anteflexed. No adnexal mass. Vascular calcifications are noted bilateral adnexal region. No pelvic mass or adenopathy. Other: There is no ascites or free abdominal air. Musculoskeletal: No destructive bony lesions are noted. There are degenerative changes thoracolumbar spine. Mild to moderate disc space flattening with mild posterior disc bulge at L4-L5 level. Minimal anterolisthesis about 3 mm L4 on L5 vertebral body. Facet degenerative changes L4 and L5 level. IMPRESSION: 1. Again noted bilateral atrophic kidney with multiple heterogeneous low-density lesions probable cysts. These cannot be characterized without IV contrast. No hydronephrosis or hydroureter. Probable punctate nonobstructive calcifications bilateral kidney are stable. Stable vascular calcifications bilateral kidneys. No calcified ureteral calculi are noted bilaterally. 2. Moderate stool noted in cecum right colon and transverse colon. Moderate stool noted within sigmoid colon and rectum. Consistent with chronic constipation. The rectum measures 5.2 cm in diameter with stool. Minimal fecal impaction cannot be excluded. No evidence of colitis or diverticulitis. 3. No small bowel obstruction. No pericecal inflammation. The appendix is not identified. 4. Again noted extensive atherosclerotic vascular calcifications. No aortic aneurysm. No adenopathy. No  ascites or free abdominal air. 5. Mild degenerative changes thoracolumbar spine as described above. Electronically Signed   By: Lahoma Crocker M.D.   On: 06/18/2016 08:53    Procedures Procedures (including critical care time)  Medications Ordered in ED Medications - No data to display   Initial Impression / Assessment and Plan / ED Course  I  have reviewed the triage vital signs and the nursing notes.  Pertinent labs & imaging results that were available during my care of the patient were reviewed by me and considered in my medical decision making (see chart for details).  71 year old female who with complaint of abdominal pain. She's not had a bowel movement 1 week despite taking oral laxatives at her facility. She is afebrile and nontoxic. She does have some mild tenderness in the left lower quadrant, but describes her abdomen as "full" and "hard". She has normal bowel sounds. No significant distention noted.  Lab work overall reassuring.  Hgb today 9.3 (was 15.3 in Feb 2018) but based on chart review, patient's hgb does tend to fluctuate.  Denies bloody stools recently.  No other bleeding issues.  FOBT negative.  Repeat CBC essentially unchanged.  CT scan with evidence of constipation, no other acute findings.  Patient was treated with enema here and able to have several small bowel movements.  States she is feeling better.  She has been tolerating PO well.  Feel she is stable for discharge.  Will have her continue oral stool softeners.  FU with PCP.  Discussed plan with patient, she acknowledged understanding and agreed with plan of care.  Return precautions given for new or worsening symptoms.  Final Clinical Impressions(s) / ED Diagnoses   Final diagnoses:  Constipation, unspecified constipation type    New Prescriptions New Prescriptions   No medications on file     Larene Pickett, PA-C 06/18/16 [place], MD 06/19/16 1555

## 2016-06-18 NOTE — ED Notes (Signed)
SS enema performed and pt denies pain but immediate return of fluid after 100-200 cc. Small amount of fluid reintroduced and pt encouraged to hold. Then placed on bedpan as she is u nable to stand.

## 2016-06-18 NOTE — ED Notes (Signed)
[facility] arrived pt stable for discharge

## 2016-06-18 NOTE — ED Notes (Signed)
Patient placed on bedpan and given coffee.

## 2016-06-18 NOTE — ED Notes (Signed)
[facility] called for transport.  

## 2016-06-18 NOTE — ED Triage Notes (Signed)
Pt transported from [facility] by [facility] for c/o abd pain and constipation x 1 week. Pt is prescribed laxatives per [facility]

## 2016-06-18 NOTE — ED Notes (Signed)
Pt given sandwich and water.

## 2016-06-18 NOTE — ED Notes (Signed)
Pt given sandwhich and tolerates well.

## 2016-06-18 NOTE — ED Notes (Signed)
Patient to CT.

## 2016-06-18 NOTE — ED Notes (Addendum)
Pt again up to bedpan.

## 2016-06-18 NOTE — Discharge Instructions (Signed)
You may continue miralax or other stool softener to help ease bowel movements.  Make sure you are staying hydrated. Follow-up with your primary care doctor. Return here for new concerns.

## 2016-06-20 DIAGNOSIS — D631 Anemia in chronic kidney disease: Secondary | ICD-10-CM | POA: Diagnosis not present

## 2016-06-20 DIAGNOSIS — N2581 Secondary hyperparathyroidism of renal origin: Secondary | ICD-10-CM | POA: Diagnosis not present

## 2016-06-20 DIAGNOSIS — D509 Iron deficiency anemia, unspecified: Secondary | ICD-10-CM | POA: Diagnosis not present

## 2016-06-20 DIAGNOSIS — E1129 Type 2 diabetes mellitus with other diabetic kidney complication: Secondary | ICD-10-CM | POA: Diagnosis not present

## 2016-06-20 DIAGNOSIS — N186 End stage renal disease: Secondary | ICD-10-CM | POA: Diagnosis not present

## 2016-06-23 DIAGNOSIS — E119 Type 2 diabetes mellitus without complications: Secondary | ICD-10-CM | POA: Diagnosis not present

## 2016-06-23 DIAGNOSIS — M1990 Unspecified osteoarthritis, unspecified site: Secondary | ICD-10-CM | POA: Diagnosis not present

## 2016-06-23 DIAGNOSIS — I10 Essential (primary) hypertension: Secondary | ICD-10-CM | POA: Diagnosis not present

## 2016-06-23 DIAGNOSIS — E1129 Type 2 diabetes mellitus with other diabetic kidney complication: Secondary | ICD-10-CM | POA: Diagnosis not present

## 2016-06-23 DIAGNOSIS — G2581 Restless legs syndrome: Secondary | ICD-10-CM | POA: Diagnosis not present

## 2016-06-23 DIAGNOSIS — N2581 Secondary hyperparathyroidism of renal origin: Secondary | ICD-10-CM | POA: Diagnosis not present

## 2016-06-23 DIAGNOSIS — N186 End stage renal disease: Secondary | ICD-10-CM | POA: Diagnosis not present

## 2016-06-23 DIAGNOSIS — D631 Anemia in chronic kidney disease: Secondary | ICD-10-CM | POA: Diagnosis not present

## 2016-06-23 DIAGNOSIS — I509 Heart failure, unspecified: Secondary | ICD-10-CM | POA: Diagnosis not present

## 2016-06-23 DIAGNOSIS — I69954 Hemiplegia and hemiparesis following unspecified cerebrovascular disease affecting left non-dominant side: Secondary | ICD-10-CM | POA: Diagnosis not present

## 2016-06-23 DIAGNOSIS — M25569 Pain in unspecified knee: Secondary | ICD-10-CM | POA: Diagnosis not present

## 2016-06-23 DIAGNOSIS — D509 Iron deficiency anemia, unspecified: Secondary | ICD-10-CM | POA: Diagnosis not present

## 2016-06-25 DIAGNOSIS — E1129 Type 2 diabetes mellitus with other diabetic kidney complication: Secondary | ICD-10-CM | POA: Diagnosis not present

## 2016-06-25 DIAGNOSIS — D631 Anemia in chronic kidney disease: Secondary | ICD-10-CM | POA: Diagnosis not present

## 2016-06-25 DIAGNOSIS — D509 Iron deficiency anemia, unspecified: Secondary | ICD-10-CM | POA: Diagnosis not present

## 2016-06-25 DIAGNOSIS — N2581 Secondary hyperparathyroidism of renal origin: Secondary | ICD-10-CM | POA: Diagnosis not present

## 2016-06-25 DIAGNOSIS — N186 End stage renal disease: Secondary | ICD-10-CM | POA: Diagnosis not present

## 2016-06-27 DIAGNOSIS — D631 Anemia in chronic kidney disease: Secondary | ICD-10-CM | POA: Diagnosis not present

## 2016-06-27 DIAGNOSIS — E1129 Type 2 diabetes mellitus with other diabetic kidney complication: Secondary | ICD-10-CM | POA: Diagnosis not present

## 2016-06-27 DIAGNOSIS — D509 Iron deficiency anemia, unspecified: Secondary | ICD-10-CM | POA: Diagnosis not present

## 2016-06-27 DIAGNOSIS — N186 End stage renal disease: Secondary | ICD-10-CM | POA: Diagnosis not present

## 2016-06-27 DIAGNOSIS — N2581 Secondary hyperparathyroidism of renal origin: Secondary | ICD-10-CM | POA: Diagnosis not present

## 2016-06-30 DIAGNOSIS — E1129 Type 2 diabetes mellitus with other diabetic kidney complication: Secondary | ICD-10-CM | POA: Diagnosis not present

## 2016-06-30 DIAGNOSIS — D509 Iron deficiency anemia, unspecified: Secondary | ICD-10-CM | POA: Diagnosis not present

## 2016-06-30 DIAGNOSIS — N186 End stage renal disease: Secondary | ICD-10-CM | POA: Diagnosis not present

## 2016-06-30 DIAGNOSIS — N2581 Secondary hyperparathyroidism of renal origin: Secondary | ICD-10-CM | POA: Diagnosis not present

## 2016-06-30 DIAGNOSIS — D631 Anemia in chronic kidney disease: Secondary | ICD-10-CM | POA: Diagnosis not present

## 2016-07-02 DIAGNOSIS — N186 End stage renal disease: Secondary | ICD-10-CM | POA: Diagnosis not present

## 2016-07-02 DIAGNOSIS — N2581 Secondary hyperparathyroidism of renal origin: Secondary | ICD-10-CM | POA: Diagnosis not present

## 2016-07-02 DIAGNOSIS — D509 Iron deficiency anemia, unspecified: Secondary | ICD-10-CM | POA: Diagnosis not present

## 2016-07-02 DIAGNOSIS — D631 Anemia in chronic kidney disease: Secondary | ICD-10-CM | POA: Diagnosis not present

## 2016-07-02 DIAGNOSIS — E1129 Type 2 diabetes mellitus with other diabetic kidney complication: Secondary | ICD-10-CM | POA: Diagnosis not present

## 2016-07-04 DIAGNOSIS — D509 Iron deficiency anemia, unspecified: Secondary | ICD-10-CM | POA: Diagnosis not present

## 2016-07-04 DIAGNOSIS — N186 End stage renal disease: Secondary | ICD-10-CM | POA: Diagnosis not present

## 2016-07-04 DIAGNOSIS — E1129 Type 2 diabetes mellitus with other diabetic kidney complication: Secondary | ICD-10-CM | POA: Diagnosis not present

## 2016-07-04 DIAGNOSIS — D631 Anemia in chronic kidney disease: Secondary | ICD-10-CM | POA: Diagnosis not present

## 2016-07-04 DIAGNOSIS — N2581 Secondary hyperparathyroidism of renal origin: Secondary | ICD-10-CM | POA: Diagnosis not present

## 2016-07-07 DIAGNOSIS — D631 Anemia in chronic kidney disease: Secondary | ICD-10-CM | POA: Diagnosis not present

## 2016-07-07 DIAGNOSIS — D509 Iron deficiency anemia, unspecified: Secondary | ICD-10-CM | POA: Diagnosis not present

## 2016-07-07 DIAGNOSIS — E1129 Type 2 diabetes mellitus with other diabetic kidney complication: Secondary | ICD-10-CM | POA: Diagnosis not present

## 2016-07-07 DIAGNOSIS — N2581 Secondary hyperparathyroidism of renal origin: Secondary | ICD-10-CM | POA: Diagnosis not present

## 2016-07-07 DIAGNOSIS — N186 End stage renal disease: Secondary | ICD-10-CM | POA: Diagnosis not present

## 2016-07-09 DIAGNOSIS — D631 Anemia in chronic kidney disease: Secondary | ICD-10-CM | POA: Diagnosis not present

## 2016-07-09 DIAGNOSIS — N2581 Secondary hyperparathyroidism of renal origin: Secondary | ICD-10-CM | POA: Diagnosis not present

## 2016-07-09 DIAGNOSIS — D509 Iron deficiency anemia, unspecified: Secondary | ICD-10-CM | POA: Diagnosis not present

## 2016-07-09 DIAGNOSIS — N186 End stage renal disease: Secondary | ICD-10-CM | POA: Diagnosis not present

## 2016-07-09 DIAGNOSIS — E1129 Type 2 diabetes mellitus with other diabetic kidney complication: Secondary | ICD-10-CM | POA: Diagnosis not present

## 2016-07-11 DIAGNOSIS — N186 End stage renal disease: Secondary | ICD-10-CM | POA: Diagnosis not present

## 2016-07-11 DIAGNOSIS — N2581 Secondary hyperparathyroidism of renal origin: Secondary | ICD-10-CM | POA: Diagnosis not present

## 2016-07-11 DIAGNOSIS — D509 Iron deficiency anemia, unspecified: Secondary | ICD-10-CM | POA: Diagnosis not present

## 2016-07-11 DIAGNOSIS — D631 Anemia in chronic kidney disease: Secondary | ICD-10-CM | POA: Diagnosis not present

## 2016-07-11 DIAGNOSIS — E1129 Type 2 diabetes mellitus with other diabetic kidney complication: Secondary | ICD-10-CM | POA: Diagnosis not present

## 2016-07-13 DIAGNOSIS — E1129 Type 2 diabetes mellitus with other diabetic kidney complication: Secondary | ICD-10-CM | POA: Diagnosis not present

## 2016-07-13 DIAGNOSIS — Z992 Dependence on renal dialysis: Secondary | ICD-10-CM | POA: Diagnosis not present

## 2016-07-13 DIAGNOSIS — N186 End stage renal disease: Secondary | ICD-10-CM | POA: Diagnosis not present

## 2016-07-14 DIAGNOSIS — D631 Anemia in chronic kidney disease: Secondary | ICD-10-CM | POA: Diagnosis not present

## 2016-07-14 DIAGNOSIS — N186 End stage renal disease: Secondary | ICD-10-CM | POA: Diagnosis not present

## 2016-07-14 DIAGNOSIS — N2581 Secondary hyperparathyroidism of renal origin: Secondary | ICD-10-CM | POA: Diagnosis not present

## 2016-07-14 DIAGNOSIS — E1129 Type 2 diabetes mellitus with other diabetic kidney complication: Secondary | ICD-10-CM | POA: Diagnosis not present

## 2016-07-14 DIAGNOSIS — D509 Iron deficiency anemia, unspecified: Secondary | ICD-10-CM | POA: Diagnosis not present

## 2016-07-16 DIAGNOSIS — N186 End stage renal disease: Secondary | ICD-10-CM | POA: Diagnosis not present

## 2016-07-16 DIAGNOSIS — E1129 Type 2 diabetes mellitus with other diabetic kidney complication: Secondary | ICD-10-CM | POA: Diagnosis not present

## 2016-07-16 DIAGNOSIS — D631 Anemia in chronic kidney disease: Secondary | ICD-10-CM | POA: Diagnosis not present

## 2016-07-16 DIAGNOSIS — D509 Iron deficiency anemia, unspecified: Secondary | ICD-10-CM | POA: Diagnosis not present

## 2016-07-16 DIAGNOSIS — N2581 Secondary hyperparathyroidism of renal origin: Secondary | ICD-10-CM | POA: Diagnosis not present

## 2016-07-18 DIAGNOSIS — N186 End stage renal disease: Secondary | ICD-10-CM | POA: Diagnosis not present

## 2016-07-18 DIAGNOSIS — N2581 Secondary hyperparathyroidism of renal origin: Secondary | ICD-10-CM | POA: Diagnosis not present

## 2016-07-18 DIAGNOSIS — D509 Iron deficiency anemia, unspecified: Secondary | ICD-10-CM | POA: Diagnosis not present

## 2016-07-18 DIAGNOSIS — D631 Anemia in chronic kidney disease: Secondary | ICD-10-CM | POA: Diagnosis not present

## 2016-07-18 DIAGNOSIS — E1129 Type 2 diabetes mellitus with other diabetic kidney complication: Secondary | ICD-10-CM | POA: Diagnosis not present

## 2016-07-21 DIAGNOSIS — D509 Iron deficiency anemia, unspecified: Secondary | ICD-10-CM | POA: Diagnosis not present

## 2016-07-21 DIAGNOSIS — E1129 Type 2 diabetes mellitus with other diabetic kidney complication: Secondary | ICD-10-CM | POA: Diagnosis not present

## 2016-07-21 DIAGNOSIS — D631 Anemia in chronic kidney disease: Secondary | ICD-10-CM | POA: Diagnosis not present

## 2016-07-21 DIAGNOSIS — N186 End stage renal disease: Secondary | ICD-10-CM | POA: Diagnosis not present

## 2016-07-21 DIAGNOSIS — N2581 Secondary hyperparathyroidism of renal origin: Secondary | ICD-10-CM | POA: Diagnosis not present

## 2016-07-23 DIAGNOSIS — N186 End stage renal disease: Secondary | ICD-10-CM | POA: Diagnosis not present

## 2016-07-23 DIAGNOSIS — D631 Anemia in chronic kidney disease: Secondary | ICD-10-CM | POA: Diagnosis not present

## 2016-07-23 DIAGNOSIS — E1129 Type 2 diabetes mellitus with other diabetic kidney complication: Secondary | ICD-10-CM | POA: Diagnosis not present

## 2016-07-23 DIAGNOSIS — D509 Iron deficiency anemia, unspecified: Secondary | ICD-10-CM | POA: Diagnosis not present

## 2016-07-23 DIAGNOSIS — N2581 Secondary hyperparathyroidism of renal origin: Secondary | ICD-10-CM | POA: Diagnosis not present

## 2016-07-25 DIAGNOSIS — N2581 Secondary hyperparathyroidism of renal origin: Secondary | ICD-10-CM | POA: Diagnosis not present

## 2016-07-25 DIAGNOSIS — D631 Anemia in chronic kidney disease: Secondary | ICD-10-CM | POA: Diagnosis not present

## 2016-07-25 DIAGNOSIS — E1129 Type 2 diabetes mellitus with other diabetic kidney complication: Secondary | ICD-10-CM | POA: Diagnosis not present

## 2016-07-25 DIAGNOSIS — N186 End stage renal disease: Secondary | ICD-10-CM | POA: Diagnosis not present

## 2016-07-25 DIAGNOSIS — D509 Iron deficiency anemia, unspecified: Secondary | ICD-10-CM | POA: Diagnosis not present

## 2016-07-28 DIAGNOSIS — N2581 Secondary hyperparathyroidism of renal origin: Secondary | ICD-10-CM | POA: Diagnosis not present

## 2016-07-28 DIAGNOSIS — D631 Anemia in chronic kidney disease: Secondary | ICD-10-CM | POA: Diagnosis not present

## 2016-07-28 DIAGNOSIS — E1129 Type 2 diabetes mellitus with other diabetic kidney complication: Secondary | ICD-10-CM | POA: Diagnosis not present

## 2016-07-28 DIAGNOSIS — N186 End stage renal disease: Secondary | ICD-10-CM | POA: Diagnosis not present

## 2016-07-28 DIAGNOSIS — D509 Iron deficiency anemia, unspecified: Secondary | ICD-10-CM | POA: Diagnosis not present

## 2016-07-30 DIAGNOSIS — N2581 Secondary hyperparathyroidism of renal origin: Secondary | ICD-10-CM | POA: Diagnosis not present

## 2016-07-30 DIAGNOSIS — N186 End stage renal disease: Secondary | ICD-10-CM | POA: Diagnosis not present

## 2016-07-30 DIAGNOSIS — E1129 Type 2 diabetes mellitus with other diabetic kidney complication: Secondary | ICD-10-CM | POA: Diagnosis not present

## 2016-07-30 DIAGNOSIS — D631 Anemia in chronic kidney disease: Secondary | ICD-10-CM | POA: Diagnosis not present

## 2016-07-30 DIAGNOSIS — D509 Iron deficiency anemia, unspecified: Secondary | ICD-10-CM | POA: Diagnosis not present

## 2016-07-31 DIAGNOSIS — R531 Weakness: Secondary | ICD-10-CM | POA: Diagnosis not present

## 2016-08-01 DIAGNOSIS — N186 End stage renal disease: Secondary | ICD-10-CM | POA: Diagnosis not present

## 2016-08-01 DIAGNOSIS — E1129 Type 2 diabetes mellitus with other diabetic kidney complication: Secondary | ICD-10-CM | POA: Diagnosis not present

## 2016-08-01 DIAGNOSIS — D509 Iron deficiency anemia, unspecified: Secondary | ICD-10-CM | POA: Diagnosis not present

## 2016-08-01 DIAGNOSIS — N2581 Secondary hyperparathyroidism of renal origin: Secondary | ICD-10-CM | POA: Diagnosis not present

## 2016-08-01 DIAGNOSIS — D631 Anemia in chronic kidney disease: Secondary | ICD-10-CM | POA: Diagnosis not present

## 2016-08-04 DIAGNOSIS — D631 Anemia in chronic kidney disease: Secondary | ICD-10-CM | POA: Diagnosis not present

## 2016-08-04 DIAGNOSIS — E1129 Type 2 diabetes mellitus with other diabetic kidney complication: Secondary | ICD-10-CM | POA: Diagnosis not present

## 2016-08-04 DIAGNOSIS — N186 End stage renal disease: Secondary | ICD-10-CM | POA: Diagnosis not present

## 2016-08-04 DIAGNOSIS — N2581 Secondary hyperparathyroidism of renal origin: Secondary | ICD-10-CM | POA: Diagnosis not present

## 2016-08-04 DIAGNOSIS — D509 Iron deficiency anemia, unspecified: Secondary | ICD-10-CM | POA: Diagnosis not present

## 2016-08-06 DIAGNOSIS — D509 Iron deficiency anemia, unspecified: Secondary | ICD-10-CM | POA: Diagnosis not present

## 2016-08-06 DIAGNOSIS — D631 Anemia in chronic kidney disease: Secondary | ICD-10-CM | POA: Diagnosis not present

## 2016-08-06 DIAGNOSIS — E1129 Type 2 diabetes mellitus with other diabetic kidney complication: Secondary | ICD-10-CM | POA: Diagnosis not present

## 2016-08-06 DIAGNOSIS — N186 End stage renal disease: Secondary | ICD-10-CM | POA: Diagnosis not present

## 2016-08-06 DIAGNOSIS — N2581 Secondary hyperparathyroidism of renal origin: Secondary | ICD-10-CM | POA: Diagnosis not present

## 2016-08-08 DIAGNOSIS — N2581 Secondary hyperparathyroidism of renal origin: Secondary | ICD-10-CM | POA: Diagnosis not present

## 2016-08-08 DIAGNOSIS — E1129 Type 2 diabetes mellitus with other diabetic kidney complication: Secondary | ICD-10-CM | POA: Diagnosis not present

## 2016-08-08 DIAGNOSIS — D509 Iron deficiency anemia, unspecified: Secondary | ICD-10-CM | POA: Diagnosis not present

## 2016-08-08 DIAGNOSIS — N186 End stage renal disease: Secondary | ICD-10-CM | POA: Diagnosis not present

## 2016-08-08 DIAGNOSIS — D631 Anemia in chronic kidney disease: Secondary | ICD-10-CM | POA: Diagnosis not present

## 2016-08-11 DIAGNOSIS — D509 Iron deficiency anemia, unspecified: Secondary | ICD-10-CM | POA: Diagnosis not present

## 2016-08-11 DIAGNOSIS — N2581 Secondary hyperparathyroidism of renal origin: Secondary | ICD-10-CM | POA: Diagnosis not present

## 2016-08-11 DIAGNOSIS — E1129 Type 2 diabetes mellitus with other diabetic kidney complication: Secondary | ICD-10-CM | POA: Diagnosis not present

## 2016-08-11 DIAGNOSIS — N186 End stage renal disease: Secondary | ICD-10-CM | POA: Diagnosis not present

## 2016-08-11 DIAGNOSIS — D631 Anemia in chronic kidney disease: Secondary | ICD-10-CM | POA: Diagnosis not present

## 2016-08-13 DIAGNOSIS — D509 Iron deficiency anemia, unspecified: Secondary | ICD-10-CM | POA: Diagnosis not present

## 2016-08-13 DIAGNOSIS — D631 Anemia in chronic kidney disease: Secondary | ICD-10-CM | POA: Diagnosis not present

## 2016-08-13 DIAGNOSIS — N186 End stage renal disease: Secondary | ICD-10-CM | POA: Diagnosis not present

## 2016-08-13 DIAGNOSIS — N2581 Secondary hyperparathyroidism of renal origin: Secondary | ICD-10-CM | POA: Diagnosis not present

## 2016-08-13 DIAGNOSIS — E1129 Type 2 diabetes mellitus with other diabetic kidney complication: Secondary | ICD-10-CM | POA: Diagnosis not present

## 2016-08-13 DIAGNOSIS — Z992 Dependence on renal dialysis: Secondary | ICD-10-CM | POA: Diagnosis not present

## 2016-08-15 DIAGNOSIS — D509 Iron deficiency anemia, unspecified: Secondary | ICD-10-CM | POA: Diagnosis not present

## 2016-08-15 DIAGNOSIS — E1129 Type 2 diabetes mellitus with other diabetic kidney complication: Secondary | ICD-10-CM | POA: Diagnosis not present

## 2016-08-15 DIAGNOSIS — D631 Anemia in chronic kidney disease: Secondary | ICD-10-CM | POA: Diagnosis not present

## 2016-08-15 DIAGNOSIS — N2581 Secondary hyperparathyroidism of renal origin: Secondary | ICD-10-CM | POA: Diagnosis not present

## 2016-08-15 DIAGNOSIS — N186 End stage renal disease: Secondary | ICD-10-CM | POA: Diagnosis not present

## 2016-08-18 DIAGNOSIS — E1129 Type 2 diabetes mellitus with other diabetic kidney complication: Secondary | ICD-10-CM | POA: Diagnosis not present

## 2016-08-18 DIAGNOSIS — N186 End stage renal disease: Secondary | ICD-10-CM | POA: Diagnosis not present

## 2016-08-18 DIAGNOSIS — N2581 Secondary hyperparathyroidism of renal origin: Secondary | ICD-10-CM | POA: Diagnosis not present

## 2016-08-18 DIAGNOSIS — D509 Iron deficiency anemia, unspecified: Secondary | ICD-10-CM | POA: Diagnosis not present

## 2016-08-18 DIAGNOSIS — D631 Anemia in chronic kidney disease: Secondary | ICD-10-CM | POA: Diagnosis not present

## 2016-08-20 DIAGNOSIS — N2581 Secondary hyperparathyroidism of renal origin: Secondary | ICD-10-CM | POA: Diagnosis not present

## 2016-08-20 DIAGNOSIS — N186 End stage renal disease: Secondary | ICD-10-CM | POA: Diagnosis not present

## 2016-08-20 DIAGNOSIS — E1129 Type 2 diabetes mellitus with other diabetic kidney complication: Secondary | ICD-10-CM | POA: Diagnosis not present

## 2016-08-20 DIAGNOSIS — D631 Anemia in chronic kidney disease: Secondary | ICD-10-CM | POA: Diagnosis not present

## 2016-08-20 DIAGNOSIS — D509 Iron deficiency anemia, unspecified: Secondary | ICD-10-CM | POA: Diagnosis not present

## 2016-08-22 DIAGNOSIS — N186 End stage renal disease: Secondary | ICD-10-CM | POA: Diagnosis not present

## 2016-08-22 DIAGNOSIS — E1129 Type 2 diabetes mellitus with other diabetic kidney complication: Secondary | ICD-10-CM | POA: Diagnosis not present

## 2016-08-22 DIAGNOSIS — D509 Iron deficiency anemia, unspecified: Secondary | ICD-10-CM | POA: Diagnosis not present

## 2016-08-22 DIAGNOSIS — N2581 Secondary hyperparathyroidism of renal origin: Secondary | ICD-10-CM | POA: Diagnosis not present

## 2016-08-22 DIAGNOSIS — D631 Anemia in chronic kidney disease: Secondary | ICD-10-CM | POA: Diagnosis not present

## 2016-08-25 DIAGNOSIS — N2581 Secondary hyperparathyroidism of renal origin: Secondary | ICD-10-CM | POA: Diagnosis not present

## 2016-08-25 DIAGNOSIS — N186 End stage renal disease: Secondary | ICD-10-CM | POA: Diagnosis not present

## 2016-08-25 DIAGNOSIS — I69954 Hemiplegia and hemiparesis following unspecified cerebrovascular disease affecting left non-dominant side: Secondary | ICD-10-CM | POA: Diagnosis not present

## 2016-08-25 DIAGNOSIS — M1990 Unspecified osteoarthritis, unspecified site: Secondary | ICD-10-CM | POA: Diagnosis not present

## 2016-08-25 DIAGNOSIS — D631 Anemia in chronic kidney disease: Secondary | ICD-10-CM | POA: Diagnosis not present

## 2016-08-25 DIAGNOSIS — G2581 Restless legs syndrome: Secondary | ICD-10-CM | POA: Diagnosis not present

## 2016-08-25 DIAGNOSIS — D509 Iron deficiency anemia, unspecified: Secondary | ICD-10-CM | POA: Diagnosis not present

## 2016-08-25 DIAGNOSIS — I10 Essential (primary) hypertension: Secondary | ICD-10-CM | POA: Diagnosis not present

## 2016-08-25 DIAGNOSIS — M25569 Pain in unspecified knee: Secondary | ICD-10-CM | POA: Diagnosis not present

## 2016-08-25 DIAGNOSIS — E1129 Type 2 diabetes mellitus with other diabetic kidney complication: Secondary | ICD-10-CM | POA: Diagnosis not present

## 2016-08-25 DIAGNOSIS — I509 Heart failure, unspecified: Secondary | ICD-10-CM | POA: Diagnosis not present

## 2016-08-25 DIAGNOSIS — E119 Type 2 diabetes mellitus without complications: Secondary | ICD-10-CM | POA: Diagnosis not present

## 2016-08-27 DIAGNOSIS — D631 Anemia in chronic kidney disease: Secondary | ICD-10-CM | POA: Diagnosis not present

## 2016-08-27 DIAGNOSIS — E1129 Type 2 diabetes mellitus with other diabetic kidney complication: Secondary | ICD-10-CM | POA: Diagnosis not present

## 2016-08-27 DIAGNOSIS — N186 End stage renal disease: Secondary | ICD-10-CM | POA: Diagnosis not present

## 2016-08-27 DIAGNOSIS — D509 Iron deficiency anemia, unspecified: Secondary | ICD-10-CM | POA: Diagnosis not present

## 2016-08-27 DIAGNOSIS — N2581 Secondary hyperparathyroidism of renal origin: Secondary | ICD-10-CM | POA: Diagnosis not present

## 2016-08-29 DIAGNOSIS — N2581 Secondary hyperparathyroidism of renal origin: Secondary | ICD-10-CM | POA: Diagnosis not present

## 2016-08-29 DIAGNOSIS — D509 Iron deficiency anemia, unspecified: Secondary | ICD-10-CM | POA: Diagnosis not present

## 2016-08-29 DIAGNOSIS — D631 Anemia in chronic kidney disease: Secondary | ICD-10-CM | POA: Diagnosis not present

## 2016-08-29 DIAGNOSIS — E1129 Type 2 diabetes mellitus with other diabetic kidney complication: Secondary | ICD-10-CM | POA: Diagnosis not present

## 2016-08-29 DIAGNOSIS — N186 End stage renal disease: Secondary | ICD-10-CM | POA: Diagnosis not present

## 2016-09-01 DIAGNOSIS — D509 Iron deficiency anemia, unspecified: Secondary | ICD-10-CM | POA: Diagnosis not present

## 2016-09-01 DIAGNOSIS — E1129 Type 2 diabetes mellitus with other diabetic kidney complication: Secondary | ICD-10-CM | POA: Diagnosis not present

## 2016-09-01 DIAGNOSIS — N2581 Secondary hyperparathyroidism of renal origin: Secondary | ICD-10-CM | POA: Diagnosis not present

## 2016-09-01 DIAGNOSIS — N186 End stage renal disease: Secondary | ICD-10-CM | POA: Diagnosis not present

## 2016-09-01 DIAGNOSIS — D631 Anemia in chronic kidney disease: Secondary | ICD-10-CM | POA: Diagnosis not present

## 2016-09-03 DIAGNOSIS — N2581 Secondary hyperparathyroidism of renal origin: Secondary | ICD-10-CM | POA: Diagnosis not present

## 2016-09-03 DIAGNOSIS — D509 Iron deficiency anemia, unspecified: Secondary | ICD-10-CM | POA: Diagnosis not present

## 2016-09-03 DIAGNOSIS — E1129 Type 2 diabetes mellitus with other diabetic kidney complication: Secondary | ICD-10-CM | POA: Diagnosis not present

## 2016-09-03 DIAGNOSIS — N186 End stage renal disease: Secondary | ICD-10-CM | POA: Diagnosis not present

## 2016-09-03 DIAGNOSIS — D631 Anemia in chronic kidney disease: Secondary | ICD-10-CM | POA: Diagnosis not present

## 2016-09-05 DIAGNOSIS — E1129 Type 2 diabetes mellitus with other diabetic kidney complication: Secondary | ICD-10-CM | POA: Diagnosis not present

## 2016-09-05 DIAGNOSIS — D509 Iron deficiency anemia, unspecified: Secondary | ICD-10-CM | POA: Diagnosis not present

## 2016-09-05 DIAGNOSIS — D631 Anemia in chronic kidney disease: Secondary | ICD-10-CM | POA: Diagnosis not present

## 2016-09-05 DIAGNOSIS — N186 End stage renal disease: Secondary | ICD-10-CM | POA: Diagnosis not present

## 2016-09-05 DIAGNOSIS — N2581 Secondary hyperparathyroidism of renal origin: Secondary | ICD-10-CM | POA: Diagnosis not present

## 2016-09-08 DIAGNOSIS — E1129 Type 2 diabetes mellitus with other diabetic kidney complication: Secondary | ICD-10-CM | POA: Diagnosis not present

## 2016-09-08 DIAGNOSIS — D631 Anemia in chronic kidney disease: Secondary | ICD-10-CM | POA: Diagnosis not present

## 2016-09-08 DIAGNOSIS — N186 End stage renal disease: Secondary | ICD-10-CM | POA: Diagnosis not present

## 2016-09-08 DIAGNOSIS — D509 Iron deficiency anemia, unspecified: Secondary | ICD-10-CM | POA: Diagnosis not present

## 2016-09-08 DIAGNOSIS — N2581 Secondary hyperparathyroidism of renal origin: Secondary | ICD-10-CM | POA: Diagnosis not present

## 2016-09-10 DIAGNOSIS — E1129 Type 2 diabetes mellitus with other diabetic kidney complication: Secondary | ICD-10-CM | POA: Diagnosis not present

## 2016-09-10 DIAGNOSIS — D631 Anemia in chronic kidney disease: Secondary | ICD-10-CM | POA: Diagnosis not present

## 2016-09-10 DIAGNOSIS — D509 Iron deficiency anemia, unspecified: Secondary | ICD-10-CM | POA: Diagnosis not present

## 2016-09-10 DIAGNOSIS — N2581 Secondary hyperparathyroidism of renal origin: Secondary | ICD-10-CM | POA: Diagnosis not present

## 2016-09-10 DIAGNOSIS — N186 End stage renal disease: Secondary | ICD-10-CM | POA: Diagnosis not present

## 2016-09-12 DIAGNOSIS — D631 Anemia in chronic kidney disease: Secondary | ICD-10-CM | POA: Diagnosis not present

## 2016-09-12 DIAGNOSIS — E1129 Type 2 diabetes mellitus with other diabetic kidney complication: Secondary | ICD-10-CM | POA: Diagnosis not present

## 2016-09-12 DIAGNOSIS — N186 End stage renal disease: Secondary | ICD-10-CM | POA: Diagnosis not present

## 2016-09-12 DIAGNOSIS — N2581 Secondary hyperparathyroidism of renal origin: Secondary | ICD-10-CM | POA: Diagnosis not present

## 2016-09-12 DIAGNOSIS — Z992 Dependence on renal dialysis: Secondary | ICD-10-CM | POA: Diagnosis not present

## 2016-09-12 DIAGNOSIS — D509 Iron deficiency anemia, unspecified: Secondary | ICD-10-CM | POA: Diagnosis not present

## 2016-09-15 DIAGNOSIS — N2581 Secondary hyperparathyroidism of renal origin: Secondary | ICD-10-CM | POA: Diagnosis not present

## 2016-09-15 DIAGNOSIS — N186 End stage renal disease: Secondary | ICD-10-CM | POA: Diagnosis not present

## 2016-09-15 DIAGNOSIS — D509 Iron deficiency anemia, unspecified: Secondary | ICD-10-CM | POA: Diagnosis not present

## 2016-09-15 DIAGNOSIS — E1129 Type 2 diabetes mellitus with other diabetic kidney complication: Secondary | ICD-10-CM | POA: Diagnosis not present

## 2016-09-17 DIAGNOSIS — E1129 Type 2 diabetes mellitus with other diabetic kidney complication: Secondary | ICD-10-CM | POA: Diagnosis not present

## 2016-09-17 DIAGNOSIS — N186 End stage renal disease: Secondary | ICD-10-CM | POA: Diagnosis not present

## 2016-09-17 DIAGNOSIS — D509 Iron deficiency anemia, unspecified: Secondary | ICD-10-CM | POA: Diagnosis not present

## 2016-09-17 DIAGNOSIS — N2581 Secondary hyperparathyroidism of renal origin: Secondary | ICD-10-CM | POA: Diagnosis not present

## 2016-09-19 DIAGNOSIS — N186 End stage renal disease: Secondary | ICD-10-CM | POA: Diagnosis not present

## 2016-09-19 DIAGNOSIS — E1129 Type 2 diabetes mellitus with other diabetic kidney complication: Secondary | ICD-10-CM | POA: Diagnosis not present

## 2016-09-19 DIAGNOSIS — N2581 Secondary hyperparathyroidism of renal origin: Secondary | ICD-10-CM | POA: Diagnosis not present

## 2016-09-19 DIAGNOSIS — D509 Iron deficiency anemia, unspecified: Secondary | ICD-10-CM | POA: Diagnosis not present

## 2016-09-22 DIAGNOSIS — E1129 Type 2 diabetes mellitus with other diabetic kidney complication: Secondary | ICD-10-CM | POA: Diagnosis not present

## 2016-09-22 DIAGNOSIS — N2581 Secondary hyperparathyroidism of renal origin: Secondary | ICD-10-CM | POA: Diagnosis not present

## 2016-09-22 DIAGNOSIS — D509 Iron deficiency anemia, unspecified: Secondary | ICD-10-CM | POA: Diagnosis not present

## 2016-09-22 DIAGNOSIS — N186 End stage renal disease: Secondary | ICD-10-CM | POA: Diagnosis not present

## 2016-09-24 DIAGNOSIS — E1129 Type 2 diabetes mellitus with other diabetic kidney complication: Secondary | ICD-10-CM | POA: Diagnosis not present

## 2016-09-24 DIAGNOSIS — N2581 Secondary hyperparathyroidism of renal origin: Secondary | ICD-10-CM | POA: Diagnosis not present

## 2016-09-24 DIAGNOSIS — N186 End stage renal disease: Secondary | ICD-10-CM | POA: Diagnosis not present

## 2016-09-24 DIAGNOSIS — D509 Iron deficiency anemia, unspecified: Secondary | ICD-10-CM | POA: Diagnosis not present

## 2016-09-26 DIAGNOSIS — D509 Iron deficiency anemia, unspecified: Secondary | ICD-10-CM | POA: Diagnosis not present

## 2016-09-26 DIAGNOSIS — E1129 Type 2 diabetes mellitus with other diabetic kidney complication: Secondary | ICD-10-CM | POA: Diagnosis not present

## 2016-09-26 DIAGNOSIS — N186 End stage renal disease: Secondary | ICD-10-CM | POA: Diagnosis not present

## 2016-09-26 DIAGNOSIS — N2581 Secondary hyperparathyroidism of renal origin: Secondary | ICD-10-CM | POA: Diagnosis not present

## 2016-09-29 DIAGNOSIS — Z86718 Personal history of other venous thrombosis and embolism: Secondary | ICD-10-CM | POA: Diagnosis not present

## 2016-09-29 DIAGNOSIS — E119 Type 2 diabetes mellitus without complications: Secondary | ICD-10-CM | POA: Diagnosis not present

## 2016-09-29 DIAGNOSIS — Z993 Dependence on wheelchair: Secondary | ICD-10-CM | POA: Diagnosis not present

## 2016-09-29 DIAGNOSIS — M1990 Unspecified osteoarthritis, unspecified site: Secondary | ICD-10-CM | POA: Diagnosis not present

## 2016-09-29 DIAGNOSIS — D509 Iron deficiency anemia, unspecified: Secondary | ICD-10-CM | POA: Diagnosis not present

## 2016-09-29 DIAGNOSIS — M25569 Pain in unspecified knee: Secondary | ICD-10-CM | POA: Diagnosis not present

## 2016-09-29 DIAGNOSIS — N2581 Secondary hyperparathyroidism of renal origin: Secondary | ICD-10-CM | POA: Diagnosis not present

## 2016-09-29 DIAGNOSIS — I509 Heart failure, unspecified: Secondary | ICD-10-CM | POA: Diagnosis not present

## 2016-09-29 DIAGNOSIS — E1129 Type 2 diabetes mellitus with other diabetic kidney complication: Secondary | ICD-10-CM | POA: Diagnosis not present

## 2016-09-29 DIAGNOSIS — N186 End stage renal disease: Secondary | ICD-10-CM | POA: Diagnosis not present

## 2016-09-29 DIAGNOSIS — I10 Essential (primary) hypertension: Secondary | ICD-10-CM | POA: Diagnosis not present

## 2016-09-30 DIAGNOSIS — Z79899 Other long term (current) drug therapy: Secondary | ICD-10-CM | POA: Diagnosis not present

## 2016-09-30 DIAGNOSIS — E785 Hyperlipidemia, unspecified: Secondary | ICD-10-CM | POA: Diagnosis not present

## 2016-09-30 DIAGNOSIS — E119 Type 2 diabetes mellitus without complications: Secondary | ICD-10-CM | POA: Diagnosis not present

## 2016-10-02 DIAGNOSIS — D509 Iron deficiency anemia, unspecified: Secondary | ICD-10-CM | POA: Diagnosis not present

## 2016-10-02 DIAGNOSIS — N186 End stage renal disease: Secondary | ICD-10-CM | POA: Diagnosis not present

## 2016-10-02 DIAGNOSIS — E1129 Type 2 diabetes mellitus with other diabetic kidney complication: Secondary | ICD-10-CM | POA: Diagnosis not present

## 2016-10-02 DIAGNOSIS — N2581 Secondary hyperparathyroidism of renal origin: Secondary | ICD-10-CM | POA: Diagnosis not present

## 2016-10-03 DIAGNOSIS — E1129 Type 2 diabetes mellitus with other diabetic kidney complication: Secondary | ICD-10-CM | POA: Diagnosis not present

## 2016-10-03 DIAGNOSIS — D509 Iron deficiency anemia, unspecified: Secondary | ICD-10-CM | POA: Diagnosis not present

## 2016-10-03 DIAGNOSIS — N2581 Secondary hyperparathyroidism of renal origin: Secondary | ICD-10-CM | POA: Diagnosis not present

## 2016-10-03 DIAGNOSIS — N186 End stage renal disease: Secondary | ICD-10-CM | POA: Diagnosis not present

## 2016-10-06 DIAGNOSIS — N186 End stage renal disease: Secondary | ICD-10-CM | POA: Diagnosis not present

## 2016-10-06 DIAGNOSIS — E1129 Type 2 diabetes mellitus with other diabetic kidney complication: Secondary | ICD-10-CM | POA: Diagnosis not present

## 2016-10-06 DIAGNOSIS — D509 Iron deficiency anemia, unspecified: Secondary | ICD-10-CM | POA: Diagnosis not present

## 2016-10-06 DIAGNOSIS — N2581 Secondary hyperparathyroidism of renal origin: Secondary | ICD-10-CM | POA: Diagnosis not present

## 2016-10-08 DIAGNOSIS — N2581 Secondary hyperparathyroidism of renal origin: Secondary | ICD-10-CM | POA: Diagnosis not present

## 2016-10-08 DIAGNOSIS — E1129 Type 2 diabetes mellitus with other diabetic kidney complication: Secondary | ICD-10-CM | POA: Diagnosis not present

## 2016-10-08 DIAGNOSIS — D509 Iron deficiency anemia, unspecified: Secondary | ICD-10-CM | POA: Diagnosis not present

## 2016-10-08 DIAGNOSIS — N186 End stage renal disease: Secondary | ICD-10-CM | POA: Diagnosis not present

## 2016-10-10 DIAGNOSIS — N186 End stage renal disease: Secondary | ICD-10-CM | POA: Diagnosis not present

## 2016-10-10 DIAGNOSIS — E1129 Type 2 diabetes mellitus with other diabetic kidney complication: Secondary | ICD-10-CM | POA: Diagnosis not present

## 2016-10-10 DIAGNOSIS — D509 Iron deficiency anemia, unspecified: Secondary | ICD-10-CM | POA: Diagnosis not present

## 2016-10-10 DIAGNOSIS — N2581 Secondary hyperparathyroidism of renal origin: Secondary | ICD-10-CM | POA: Diagnosis not present

## 2016-10-13 DIAGNOSIS — D509 Iron deficiency anemia, unspecified: Secondary | ICD-10-CM | POA: Diagnosis not present

## 2016-10-13 DIAGNOSIS — Z992 Dependence on renal dialysis: Secondary | ICD-10-CM | POA: Diagnosis not present

## 2016-10-13 DIAGNOSIS — E1129 Type 2 diabetes mellitus with other diabetic kidney complication: Secondary | ICD-10-CM | POA: Diagnosis not present

## 2016-10-13 DIAGNOSIS — N2581 Secondary hyperparathyroidism of renal origin: Secondary | ICD-10-CM | POA: Diagnosis not present

## 2016-10-13 DIAGNOSIS — N186 End stage renal disease: Secondary | ICD-10-CM | POA: Diagnosis not present

## 2016-10-14 DIAGNOSIS — L851 Acquired keratosis [keratoderma] palmaris et plantaris: Secondary | ICD-10-CM | POA: Diagnosis not present

## 2016-10-14 DIAGNOSIS — M79673 Pain in unspecified foot: Secondary | ICD-10-CM | POA: Diagnosis not present

## 2016-10-14 DIAGNOSIS — E1121 Type 2 diabetes mellitus with diabetic nephropathy: Secondary | ICD-10-CM | POA: Diagnosis not present

## 2016-10-14 DIAGNOSIS — L602 Onychogryphosis: Secondary | ICD-10-CM | POA: Diagnosis not present

## 2016-10-15 DIAGNOSIS — D509 Iron deficiency anemia, unspecified: Secondary | ICD-10-CM | POA: Diagnosis not present

## 2016-10-15 DIAGNOSIS — E1129 Type 2 diabetes mellitus with other diabetic kidney complication: Secondary | ICD-10-CM | POA: Diagnosis not present

## 2016-10-15 DIAGNOSIS — N2581 Secondary hyperparathyroidism of renal origin: Secondary | ICD-10-CM | POA: Diagnosis not present

## 2016-10-15 DIAGNOSIS — D631 Anemia in chronic kidney disease: Secondary | ICD-10-CM | POA: Diagnosis not present

## 2016-10-15 DIAGNOSIS — N186 End stage renal disease: Secondary | ICD-10-CM | POA: Diagnosis not present

## 2016-10-17 DIAGNOSIS — E1129 Type 2 diabetes mellitus with other diabetic kidney complication: Secondary | ICD-10-CM | POA: Diagnosis not present

## 2016-10-17 DIAGNOSIS — D631 Anemia in chronic kidney disease: Secondary | ICD-10-CM | POA: Diagnosis not present

## 2016-10-17 DIAGNOSIS — N186 End stage renal disease: Secondary | ICD-10-CM | POA: Diagnosis not present

## 2016-10-17 DIAGNOSIS — N2581 Secondary hyperparathyroidism of renal origin: Secondary | ICD-10-CM | POA: Diagnosis not present

## 2016-10-17 DIAGNOSIS — D509 Iron deficiency anemia, unspecified: Secondary | ICD-10-CM | POA: Diagnosis not present

## 2016-10-20 DIAGNOSIS — D631 Anemia in chronic kidney disease: Secondary | ICD-10-CM | POA: Diagnosis not present

## 2016-10-20 DIAGNOSIS — D509 Iron deficiency anemia, unspecified: Secondary | ICD-10-CM | POA: Diagnosis not present

## 2016-10-20 DIAGNOSIS — E1129 Type 2 diabetes mellitus with other diabetic kidney complication: Secondary | ICD-10-CM | POA: Diagnosis not present

## 2016-10-20 DIAGNOSIS — N2581 Secondary hyperparathyroidism of renal origin: Secondary | ICD-10-CM | POA: Diagnosis not present

## 2016-10-20 DIAGNOSIS — N186 End stage renal disease: Secondary | ICD-10-CM | POA: Diagnosis not present

## 2016-10-21 DIAGNOSIS — H33052 Total retinal detachment, left eye: Secondary | ICD-10-CM | POA: Diagnosis not present

## 2016-10-21 DIAGNOSIS — H43813 Vitreous degeneration, bilateral: Secondary | ICD-10-CM | POA: Diagnosis not present

## 2016-10-21 DIAGNOSIS — H3341 Traction detachment of retina, right eye: Secondary | ICD-10-CM | POA: Diagnosis not present

## 2016-10-21 DIAGNOSIS — H26491 Other secondary cataract, right eye: Secondary | ICD-10-CM | POA: Diagnosis not present

## 2016-10-21 DIAGNOSIS — E113553 Type 2 diabetes mellitus with stable proliferative diabetic retinopathy, bilateral: Secondary | ICD-10-CM | POA: Diagnosis not present

## 2016-10-22 DIAGNOSIS — N186 End stage renal disease: Secondary | ICD-10-CM | POA: Diagnosis not present

## 2016-10-22 DIAGNOSIS — E1129 Type 2 diabetes mellitus with other diabetic kidney complication: Secondary | ICD-10-CM | POA: Diagnosis not present

## 2016-10-22 DIAGNOSIS — D509 Iron deficiency anemia, unspecified: Secondary | ICD-10-CM | POA: Diagnosis not present

## 2016-10-22 DIAGNOSIS — D631 Anemia in chronic kidney disease: Secondary | ICD-10-CM | POA: Diagnosis not present

## 2016-10-22 DIAGNOSIS — N2581 Secondary hyperparathyroidism of renal origin: Secondary | ICD-10-CM | POA: Diagnosis not present

## 2016-10-24 DIAGNOSIS — D631 Anemia in chronic kidney disease: Secondary | ICD-10-CM | POA: Diagnosis not present

## 2016-10-24 DIAGNOSIS — N2581 Secondary hyperparathyroidism of renal origin: Secondary | ICD-10-CM | POA: Diagnosis not present

## 2016-10-24 DIAGNOSIS — D509 Iron deficiency anemia, unspecified: Secondary | ICD-10-CM | POA: Diagnosis not present

## 2016-10-24 DIAGNOSIS — E1129 Type 2 diabetes mellitus with other diabetic kidney complication: Secondary | ICD-10-CM | POA: Diagnosis not present

## 2016-10-24 DIAGNOSIS — N186 End stage renal disease: Secondary | ICD-10-CM | POA: Diagnosis not present

## 2016-10-27 DIAGNOSIS — I69954 Hemiplegia and hemiparesis following unspecified cerebrovascular disease affecting left non-dominant side: Secondary | ICD-10-CM | POA: Diagnosis not present

## 2016-10-27 DIAGNOSIS — I10 Essential (primary) hypertension: Secondary | ICD-10-CM | POA: Diagnosis not present

## 2016-10-27 DIAGNOSIS — E119 Type 2 diabetes mellitus without complications: Secondary | ICD-10-CM | POA: Diagnosis not present

## 2016-10-27 DIAGNOSIS — D631 Anemia in chronic kidney disease: Secondary | ICD-10-CM | POA: Diagnosis not present

## 2016-10-27 DIAGNOSIS — M1990 Unspecified osteoarthritis, unspecified site: Secondary | ICD-10-CM | POA: Diagnosis not present

## 2016-10-27 DIAGNOSIS — E1129 Type 2 diabetes mellitus with other diabetic kidney complication: Secondary | ICD-10-CM | POA: Diagnosis not present

## 2016-10-27 DIAGNOSIS — I509 Heart failure, unspecified: Secondary | ICD-10-CM | POA: Diagnosis not present

## 2016-10-27 DIAGNOSIS — H409 Unspecified glaucoma: Secondary | ICD-10-CM | POA: Diagnosis not present

## 2016-10-27 DIAGNOSIS — N2581 Secondary hyperparathyroidism of renal origin: Secondary | ICD-10-CM | POA: Diagnosis not present

## 2016-10-27 DIAGNOSIS — D509 Iron deficiency anemia, unspecified: Secondary | ICD-10-CM | POA: Diagnosis not present

## 2016-10-27 DIAGNOSIS — M25569 Pain in unspecified knee: Secondary | ICD-10-CM | POA: Diagnosis not present

## 2016-10-27 DIAGNOSIS — N186 End stage renal disease: Secondary | ICD-10-CM | POA: Diagnosis not present

## 2016-10-29 DIAGNOSIS — E1129 Type 2 diabetes mellitus with other diabetic kidney complication: Secondary | ICD-10-CM | POA: Diagnosis not present

## 2016-10-29 DIAGNOSIS — N2581 Secondary hyperparathyroidism of renal origin: Secondary | ICD-10-CM | POA: Diagnosis not present

## 2016-10-29 DIAGNOSIS — D509 Iron deficiency anemia, unspecified: Secondary | ICD-10-CM | POA: Diagnosis not present

## 2016-10-29 DIAGNOSIS — N186 End stage renal disease: Secondary | ICD-10-CM | POA: Diagnosis not present

## 2016-10-29 DIAGNOSIS — D631 Anemia in chronic kidney disease: Secondary | ICD-10-CM | POA: Diagnosis not present

## 2016-10-31 DIAGNOSIS — D631 Anemia in chronic kidney disease: Secondary | ICD-10-CM | POA: Diagnosis not present

## 2016-10-31 DIAGNOSIS — N2581 Secondary hyperparathyroidism of renal origin: Secondary | ICD-10-CM | POA: Diagnosis not present

## 2016-10-31 DIAGNOSIS — E1129 Type 2 diabetes mellitus with other diabetic kidney complication: Secondary | ICD-10-CM | POA: Diagnosis not present

## 2016-10-31 DIAGNOSIS — D509 Iron deficiency anemia, unspecified: Secondary | ICD-10-CM | POA: Diagnosis not present

## 2016-10-31 DIAGNOSIS — N186 End stage renal disease: Secondary | ICD-10-CM | POA: Diagnosis not present

## 2016-11-01 IMAGING — CR DG ABDOMEN ACUTE W/ 1V CHEST
3 series · 3 of 3 positions shown · non-contrast
Comparison: Prior radiograph from 05/24/2015.

CLINICAL DATA: Initial evaluation for acute upper abdominal pain.
History of constipation.

EXAM:
DG ABDOMEN ACUTE W/ 1V CHEST

[chest pa]
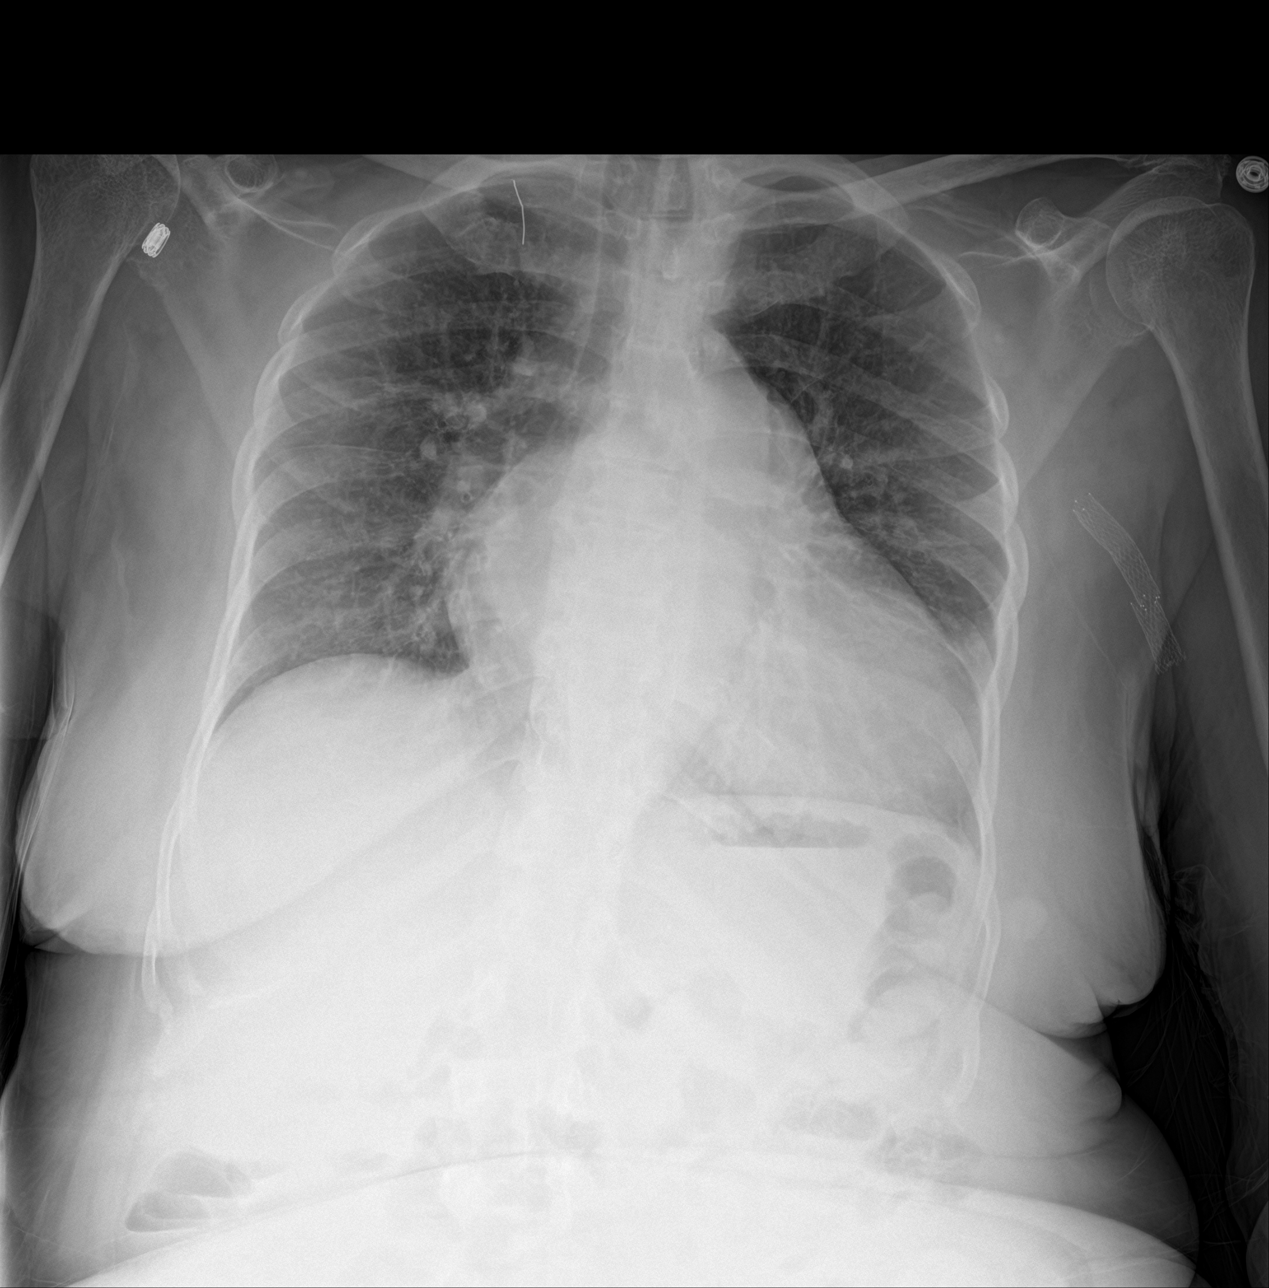

[abdomen erect]
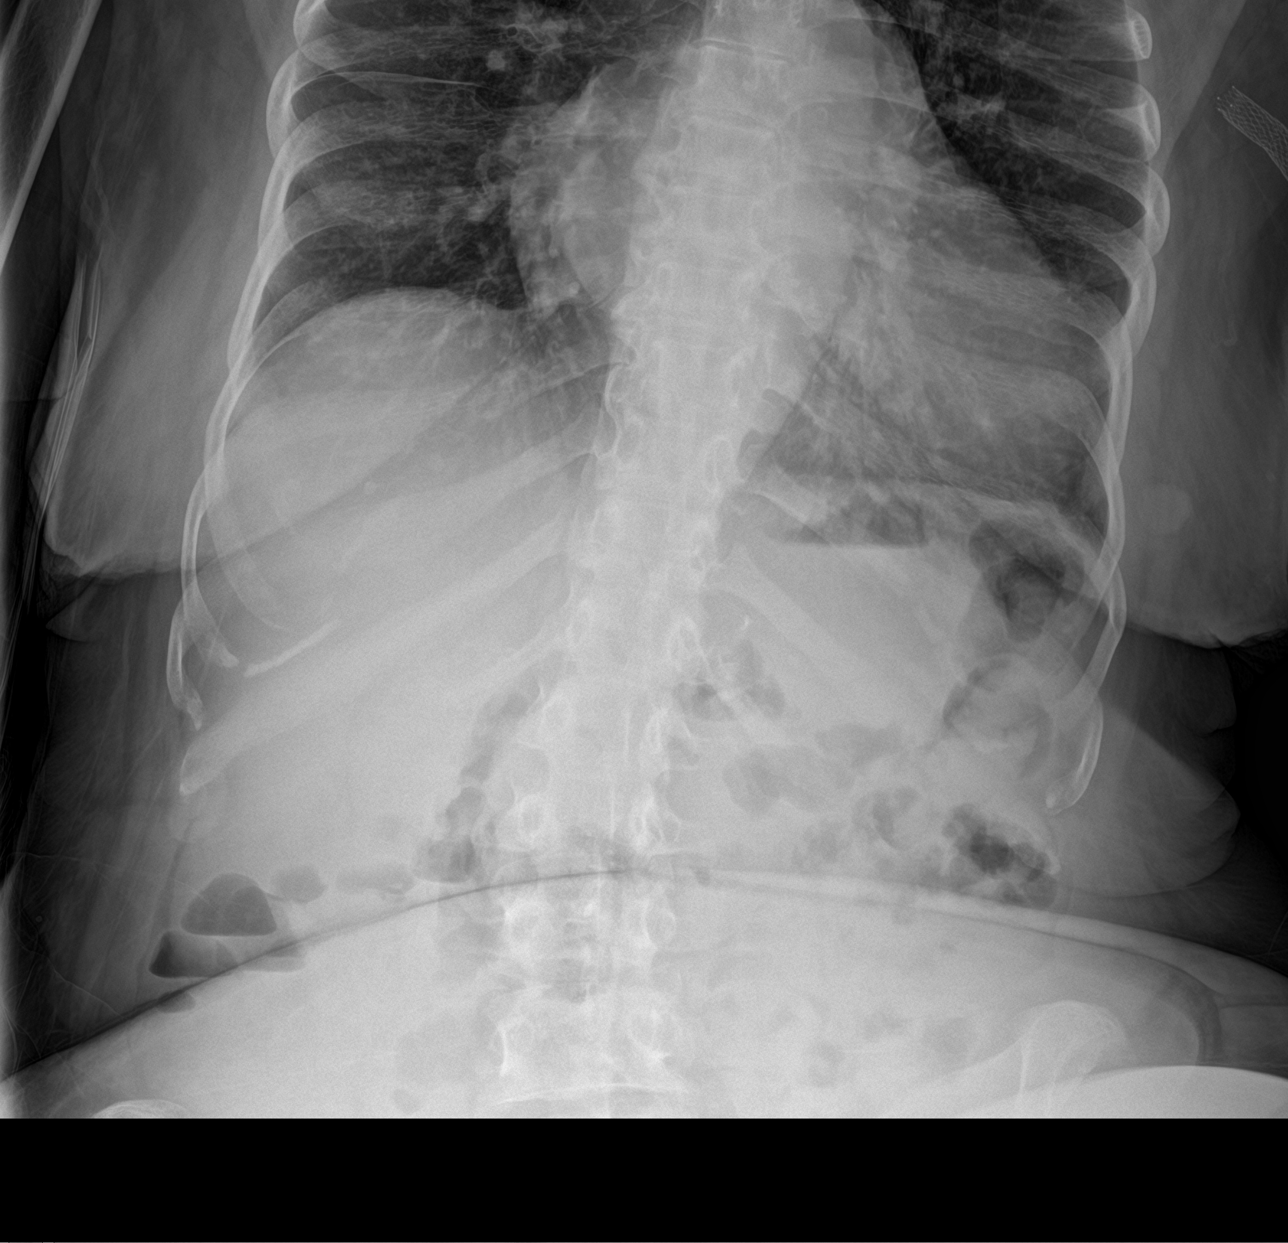

[abdomen supine]
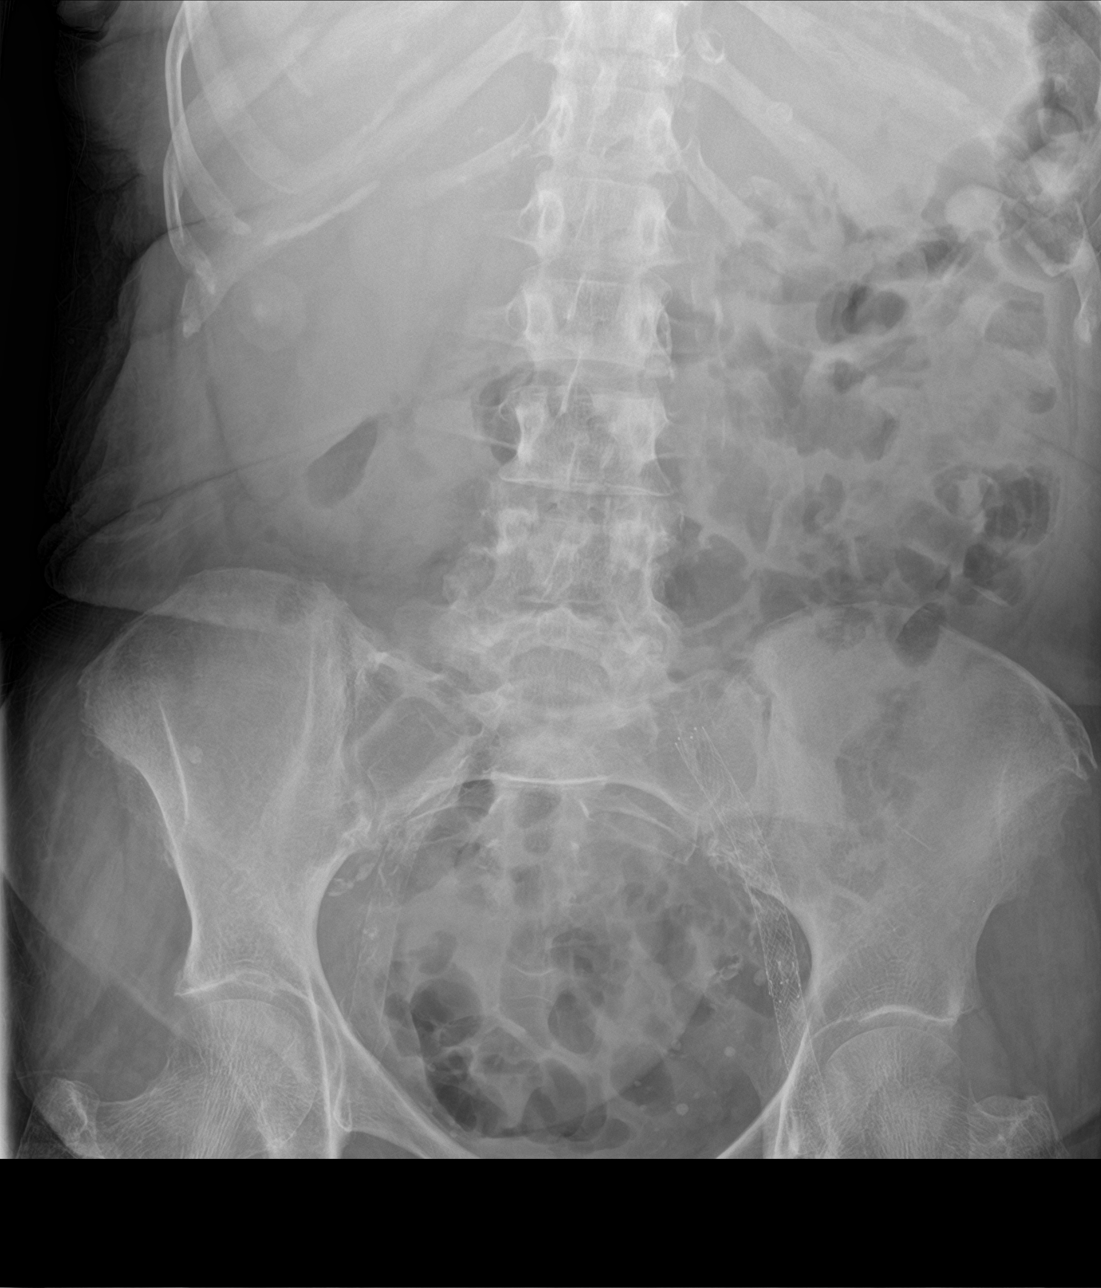

[3 of 3 positions shown; findings below may reference images not displayed]

FINDINGS: Cardiomegaly is stable from prior. Mediastinal silhouette within
normal limits. Tortuosity the intrathoracic aorta noted.

Lungs normally inflated. Mild vascular congestion without overt
pulmonary edema. No pleural effusion. No focal infiltrates. No
pneumothorax.

Vascular stent overlies the left axilla. Radiopaque linear density
overlying the right lung apex is unchanged.

Bowel gas pattern within normal limits without evidence for
obstruction or ileus. No free air. No soft tissue mass. Extensive
vascular calcifications noted. Stent overlies the region of the left
iliac vessels. Surgical clips at the left inguinal region.

Advanced degenerative changes within the lower lumbar spine.
IMPRESSION: 1. Nonobstructive bowel gas pattern with no radiographic evidence
for acute intra-abdominal process.
2. Cardiomegaly with mild perihilar vascular congestion without
overt pulmonary edema.
3. Chronic findings as above.

## 2016-11-03 DIAGNOSIS — N186 End stage renal disease: Secondary | ICD-10-CM | POA: Diagnosis not present

## 2016-11-03 DIAGNOSIS — E1129 Type 2 diabetes mellitus with other diabetic kidney complication: Secondary | ICD-10-CM | POA: Diagnosis not present

## 2016-11-03 DIAGNOSIS — N2581 Secondary hyperparathyroidism of renal origin: Secondary | ICD-10-CM | POA: Diagnosis not present

## 2016-11-03 DIAGNOSIS — D509 Iron deficiency anemia, unspecified: Secondary | ICD-10-CM | POA: Diagnosis not present

## 2016-11-03 DIAGNOSIS — D631 Anemia in chronic kidney disease: Secondary | ICD-10-CM | POA: Diagnosis not present

## 2016-11-05 DIAGNOSIS — E1129 Type 2 diabetes mellitus with other diabetic kidney complication: Secondary | ICD-10-CM | POA: Diagnosis not present

## 2016-11-05 DIAGNOSIS — D509 Iron deficiency anemia, unspecified: Secondary | ICD-10-CM | POA: Diagnosis not present

## 2016-11-05 DIAGNOSIS — D631 Anemia in chronic kidney disease: Secondary | ICD-10-CM | POA: Diagnosis not present

## 2016-11-05 DIAGNOSIS — N186 End stage renal disease: Secondary | ICD-10-CM | POA: Diagnosis not present

## 2016-11-05 DIAGNOSIS — N2581 Secondary hyperparathyroidism of renal origin: Secondary | ICD-10-CM | POA: Diagnosis not present

## 2016-11-07 DIAGNOSIS — D509 Iron deficiency anemia, unspecified: Secondary | ICD-10-CM | POA: Diagnosis not present

## 2016-11-07 DIAGNOSIS — D631 Anemia in chronic kidney disease: Secondary | ICD-10-CM | POA: Diagnosis not present

## 2016-11-07 DIAGNOSIS — N2581 Secondary hyperparathyroidism of renal origin: Secondary | ICD-10-CM | POA: Diagnosis not present

## 2016-11-07 DIAGNOSIS — N186 End stage renal disease: Secondary | ICD-10-CM | POA: Diagnosis not present

## 2016-11-07 DIAGNOSIS — E1129 Type 2 diabetes mellitus with other diabetic kidney complication: Secondary | ICD-10-CM | POA: Diagnosis not present

## 2016-11-10 DIAGNOSIS — N2581 Secondary hyperparathyroidism of renal origin: Secondary | ICD-10-CM | POA: Diagnosis not present

## 2016-11-10 DIAGNOSIS — N186 End stage renal disease: Secondary | ICD-10-CM | POA: Diagnosis not present

## 2016-11-10 DIAGNOSIS — E1129 Type 2 diabetes mellitus with other diabetic kidney complication: Secondary | ICD-10-CM | POA: Diagnosis not present

## 2016-11-10 DIAGNOSIS — D631 Anemia in chronic kidney disease: Secondary | ICD-10-CM | POA: Diagnosis not present

## 2016-11-10 DIAGNOSIS — D509 Iron deficiency anemia, unspecified: Secondary | ICD-10-CM | POA: Diagnosis not present

## 2016-11-12 DIAGNOSIS — D509 Iron deficiency anemia, unspecified: Secondary | ICD-10-CM | POA: Diagnosis not present

## 2016-11-12 DIAGNOSIS — D631 Anemia in chronic kidney disease: Secondary | ICD-10-CM | POA: Diagnosis not present

## 2016-11-12 DIAGNOSIS — N2581 Secondary hyperparathyroidism of renal origin: Secondary | ICD-10-CM | POA: Diagnosis not present

## 2016-11-12 DIAGNOSIS — E1129 Type 2 diabetes mellitus with other diabetic kidney complication: Secondary | ICD-10-CM | POA: Diagnosis not present

## 2016-11-12 DIAGNOSIS — N186 End stage renal disease: Secondary | ICD-10-CM | POA: Diagnosis not present

## 2016-11-13 DIAGNOSIS — Z992 Dependence on renal dialysis: Secondary | ICD-10-CM | POA: Diagnosis not present

## 2016-11-13 DIAGNOSIS — N186 End stage renal disease: Secondary | ICD-10-CM | POA: Diagnosis not present

## 2016-11-13 DIAGNOSIS — E1129 Type 2 diabetes mellitus with other diabetic kidney complication: Secondary | ICD-10-CM | POA: Diagnosis not present

## 2016-11-14 DIAGNOSIS — D631 Anemia in chronic kidney disease: Secondary | ICD-10-CM | POA: Diagnosis not present

## 2016-11-14 DIAGNOSIS — E1129 Type 2 diabetes mellitus with other diabetic kidney complication: Secondary | ICD-10-CM | POA: Diagnosis not present

## 2016-11-14 DIAGNOSIS — Z23 Encounter for immunization: Secondary | ICD-10-CM | POA: Diagnosis not present

## 2016-11-14 DIAGNOSIS — N186 End stage renal disease: Secondary | ICD-10-CM | POA: Diagnosis not present

## 2016-11-14 DIAGNOSIS — D509 Iron deficiency anemia, unspecified: Secondary | ICD-10-CM | POA: Diagnosis not present

## 2016-11-14 DIAGNOSIS — N2581 Secondary hyperparathyroidism of renal origin: Secondary | ICD-10-CM | POA: Diagnosis not present

## 2016-11-17 DIAGNOSIS — Z23 Encounter for immunization: Secondary | ICD-10-CM | POA: Diagnosis not present

## 2016-11-17 DIAGNOSIS — D631 Anemia in chronic kidney disease: Secondary | ICD-10-CM | POA: Diagnosis not present

## 2016-11-17 DIAGNOSIS — N186 End stage renal disease: Secondary | ICD-10-CM | POA: Diagnosis not present

## 2016-11-17 DIAGNOSIS — E1129 Type 2 diabetes mellitus with other diabetic kidney complication: Secondary | ICD-10-CM | POA: Diagnosis not present

## 2016-11-17 DIAGNOSIS — D509 Iron deficiency anemia, unspecified: Secondary | ICD-10-CM | POA: Diagnosis not present

## 2016-11-17 DIAGNOSIS — N2581 Secondary hyperparathyroidism of renal origin: Secondary | ICD-10-CM | POA: Diagnosis not present

## 2016-11-19 DIAGNOSIS — N186 End stage renal disease: Secondary | ICD-10-CM | POA: Diagnosis not present

## 2016-11-19 DIAGNOSIS — D631 Anemia in chronic kidney disease: Secondary | ICD-10-CM | POA: Diagnosis not present

## 2016-11-19 DIAGNOSIS — E1129 Type 2 diabetes mellitus with other diabetic kidney complication: Secondary | ICD-10-CM | POA: Diagnosis not present

## 2016-11-19 DIAGNOSIS — Z23 Encounter for immunization: Secondary | ICD-10-CM | POA: Diagnosis not present

## 2016-11-19 DIAGNOSIS — D509 Iron deficiency anemia, unspecified: Secondary | ICD-10-CM | POA: Diagnosis not present

## 2016-11-19 DIAGNOSIS — N2581 Secondary hyperparathyroidism of renal origin: Secondary | ICD-10-CM | POA: Diagnosis not present

## 2016-11-21 DIAGNOSIS — D509 Iron deficiency anemia, unspecified: Secondary | ICD-10-CM | POA: Diagnosis not present

## 2016-11-21 DIAGNOSIS — N2581 Secondary hyperparathyroidism of renal origin: Secondary | ICD-10-CM | POA: Diagnosis not present

## 2016-11-21 DIAGNOSIS — E1129 Type 2 diabetes mellitus with other diabetic kidney complication: Secondary | ICD-10-CM | POA: Diagnosis not present

## 2016-11-21 DIAGNOSIS — D631 Anemia in chronic kidney disease: Secondary | ICD-10-CM | POA: Diagnosis not present

## 2016-11-21 DIAGNOSIS — Z23 Encounter for immunization: Secondary | ICD-10-CM | POA: Diagnosis not present

## 2016-11-21 DIAGNOSIS — N186 End stage renal disease: Secondary | ICD-10-CM | POA: Diagnosis not present

## 2016-11-24 DIAGNOSIS — I10 Essential (primary) hypertension: Secondary | ICD-10-CM | POA: Diagnosis not present

## 2016-11-24 DIAGNOSIS — N2581 Secondary hyperparathyroidism of renal origin: Secondary | ICD-10-CM | POA: Diagnosis not present

## 2016-11-24 DIAGNOSIS — E119 Type 2 diabetes mellitus without complications: Secondary | ICD-10-CM | POA: Diagnosis not present

## 2016-11-24 DIAGNOSIS — D509 Iron deficiency anemia, unspecified: Secondary | ICD-10-CM | POA: Diagnosis not present

## 2016-11-24 DIAGNOSIS — E1129 Type 2 diabetes mellitus with other diabetic kidney complication: Secondary | ICD-10-CM | POA: Diagnosis not present

## 2016-11-24 DIAGNOSIS — Z86718 Personal history of other venous thrombosis and embolism: Secondary | ICD-10-CM | POA: Diagnosis not present

## 2016-11-24 DIAGNOSIS — H409 Unspecified glaucoma: Secondary | ICD-10-CM | POA: Diagnosis not present

## 2016-11-24 DIAGNOSIS — Z23 Encounter for immunization: Secondary | ICD-10-CM | POA: Diagnosis not present

## 2016-11-24 DIAGNOSIS — N186 End stage renal disease: Secondary | ICD-10-CM | POA: Diagnosis not present

## 2016-11-24 DIAGNOSIS — M1990 Unspecified osteoarthritis, unspecified site: Secondary | ICD-10-CM | POA: Diagnosis not present

## 2016-11-24 DIAGNOSIS — I69954 Hemiplegia and hemiparesis following unspecified cerebrovascular disease affecting left non-dominant side: Secondary | ICD-10-CM | POA: Diagnosis not present

## 2016-11-24 DIAGNOSIS — I509 Heart failure, unspecified: Secondary | ICD-10-CM | POA: Diagnosis not present

## 2016-11-24 DIAGNOSIS — D631 Anemia in chronic kidney disease: Secondary | ICD-10-CM | POA: Diagnosis not present

## 2016-11-24 DIAGNOSIS — G2581 Restless legs syndrome: Secondary | ICD-10-CM | POA: Diagnosis not present

## 2016-11-26 DIAGNOSIS — N186 End stage renal disease: Secondary | ICD-10-CM | POA: Diagnosis not present

## 2016-11-26 DIAGNOSIS — Z23 Encounter for immunization: Secondary | ICD-10-CM | POA: Diagnosis not present

## 2016-11-26 DIAGNOSIS — D509 Iron deficiency anemia, unspecified: Secondary | ICD-10-CM | POA: Diagnosis not present

## 2016-11-26 DIAGNOSIS — E1129 Type 2 diabetes mellitus with other diabetic kidney complication: Secondary | ICD-10-CM | POA: Diagnosis not present

## 2016-11-26 DIAGNOSIS — N2581 Secondary hyperparathyroidism of renal origin: Secondary | ICD-10-CM | POA: Diagnosis not present

## 2016-11-26 DIAGNOSIS — D631 Anemia in chronic kidney disease: Secondary | ICD-10-CM | POA: Diagnosis not present

## 2016-11-28 DIAGNOSIS — N2581 Secondary hyperparathyroidism of renal origin: Secondary | ICD-10-CM | POA: Diagnosis not present

## 2016-11-28 DIAGNOSIS — Z23 Encounter for immunization: Secondary | ICD-10-CM | POA: Diagnosis not present

## 2016-11-28 DIAGNOSIS — N186 End stage renal disease: Secondary | ICD-10-CM | POA: Diagnosis not present

## 2016-11-28 DIAGNOSIS — D631 Anemia in chronic kidney disease: Secondary | ICD-10-CM | POA: Diagnosis not present

## 2016-11-28 DIAGNOSIS — E1129 Type 2 diabetes mellitus with other diabetic kidney complication: Secondary | ICD-10-CM | POA: Diagnosis not present

## 2016-11-28 DIAGNOSIS — D509 Iron deficiency anemia, unspecified: Secondary | ICD-10-CM | POA: Diagnosis not present

## 2016-12-01 DIAGNOSIS — N186 End stage renal disease: Secondary | ICD-10-CM | POA: Diagnosis not present

## 2016-12-01 DIAGNOSIS — Z23 Encounter for immunization: Secondary | ICD-10-CM | POA: Diagnosis not present

## 2016-12-01 DIAGNOSIS — E1129 Type 2 diabetes mellitus with other diabetic kidney complication: Secondary | ICD-10-CM | POA: Diagnosis not present

## 2016-12-01 DIAGNOSIS — D631 Anemia in chronic kidney disease: Secondary | ICD-10-CM | POA: Diagnosis not present

## 2016-12-01 DIAGNOSIS — D509 Iron deficiency anemia, unspecified: Secondary | ICD-10-CM | POA: Diagnosis not present

## 2016-12-01 DIAGNOSIS — N2581 Secondary hyperparathyroidism of renal origin: Secondary | ICD-10-CM | POA: Diagnosis not present

## 2016-12-03 DIAGNOSIS — E1129 Type 2 diabetes mellitus with other diabetic kidney complication: Secondary | ICD-10-CM | POA: Diagnosis not present

## 2016-12-03 DIAGNOSIS — D509 Iron deficiency anemia, unspecified: Secondary | ICD-10-CM | POA: Diagnosis not present

## 2016-12-03 DIAGNOSIS — Z23 Encounter for immunization: Secondary | ICD-10-CM | POA: Diagnosis not present

## 2016-12-03 DIAGNOSIS — D631 Anemia in chronic kidney disease: Secondary | ICD-10-CM | POA: Diagnosis not present

## 2016-12-03 DIAGNOSIS — N186 End stage renal disease: Secondary | ICD-10-CM | POA: Diagnosis not present

## 2016-12-03 DIAGNOSIS — N2581 Secondary hyperparathyroidism of renal origin: Secondary | ICD-10-CM | POA: Diagnosis not present

## 2016-12-04 DIAGNOSIS — I10 Essential (primary) hypertension: Secondary | ICD-10-CM | POA: Diagnosis not present

## 2016-12-04 DIAGNOSIS — E119 Type 2 diabetes mellitus without complications: Secondary | ICD-10-CM | POA: Diagnosis not present

## 2016-12-04 DIAGNOSIS — I5020 Unspecified systolic (congestive) heart failure: Secondary | ICD-10-CM | POA: Diagnosis not present

## 2016-12-04 DIAGNOSIS — N186 End stage renal disease: Secondary | ICD-10-CM | POA: Diagnosis not present

## 2016-12-05 DIAGNOSIS — D509 Iron deficiency anemia, unspecified: Secondary | ICD-10-CM | POA: Diagnosis not present

## 2016-12-05 DIAGNOSIS — Z23 Encounter for immunization: Secondary | ICD-10-CM | POA: Diagnosis not present

## 2016-12-05 DIAGNOSIS — E1129 Type 2 diabetes mellitus with other diabetic kidney complication: Secondary | ICD-10-CM | POA: Diagnosis not present

## 2016-12-05 DIAGNOSIS — N186 End stage renal disease: Secondary | ICD-10-CM | POA: Diagnosis not present

## 2016-12-05 DIAGNOSIS — D631 Anemia in chronic kidney disease: Secondary | ICD-10-CM | POA: Diagnosis not present

## 2016-12-05 DIAGNOSIS — N2581 Secondary hyperparathyroidism of renal origin: Secondary | ICD-10-CM | POA: Diagnosis not present

## 2016-12-08 DIAGNOSIS — N186 End stage renal disease: Secondary | ICD-10-CM | POA: Diagnosis not present

## 2016-12-08 DIAGNOSIS — D631 Anemia in chronic kidney disease: Secondary | ICD-10-CM | POA: Diagnosis not present

## 2016-12-08 DIAGNOSIS — E1129 Type 2 diabetes mellitus with other diabetic kidney complication: Secondary | ICD-10-CM | POA: Diagnosis not present

## 2016-12-08 DIAGNOSIS — Z23 Encounter for immunization: Secondary | ICD-10-CM | POA: Diagnosis not present

## 2016-12-08 DIAGNOSIS — D509 Iron deficiency anemia, unspecified: Secondary | ICD-10-CM | POA: Diagnosis not present

## 2016-12-08 DIAGNOSIS — N2581 Secondary hyperparathyroidism of renal origin: Secondary | ICD-10-CM | POA: Diagnosis not present

## 2016-12-10 DIAGNOSIS — D631 Anemia in chronic kidney disease: Secondary | ICD-10-CM | POA: Diagnosis not present

## 2016-12-10 DIAGNOSIS — N186 End stage renal disease: Secondary | ICD-10-CM | POA: Diagnosis not present

## 2016-12-10 DIAGNOSIS — E1129 Type 2 diabetes mellitus with other diabetic kidney complication: Secondary | ICD-10-CM | POA: Diagnosis not present

## 2016-12-10 DIAGNOSIS — N2581 Secondary hyperparathyroidism of renal origin: Secondary | ICD-10-CM | POA: Diagnosis not present

## 2016-12-10 DIAGNOSIS — D509 Iron deficiency anemia, unspecified: Secondary | ICD-10-CM | POA: Diagnosis not present

## 2016-12-10 DIAGNOSIS — Z23 Encounter for immunization: Secondary | ICD-10-CM | POA: Diagnosis not present

## 2016-12-12 DIAGNOSIS — N186 End stage renal disease: Secondary | ICD-10-CM | POA: Diagnosis not present

## 2016-12-12 DIAGNOSIS — D509 Iron deficiency anemia, unspecified: Secondary | ICD-10-CM | POA: Diagnosis not present

## 2016-12-12 DIAGNOSIS — Z23 Encounter for immunization: Secondary | ICD-10-CM | POA: Diagnosis not present

## 2016-12-12 DIAGNOSIS — D631 Anemia in chronic kidney disease: Secondary | ICD-10-CM | POA: Diagnosis not present

## 2016-12-12 DIAGNOSIS — N2581 Secondary hyperparathyroidism of renal origin: Secondary | ICD-10-CM | POA: Diagnosis not present

## 2016-12-12 DIAGNOSIS — E1129 Type 2 diabetes mellitus with other diabetic kidney complication: Secondary | ICD-10-CM | POA: Diagnosis not present

## 2016-12-13 DIAGNOSIS — N186 End stage renal disease: Secondary | ICD-10-CM | POA: Diagnosis not present

## 2016-12-13 DIAGNOSIS — Z992 Dependence on renal dialysis: Secondary | ICD-10-CM | POA: Diagnosis not present

## 2016-12-13 DIAGNOSIS — E1129 Type 2 diabetes mellitus with other diabetic kidney complication: Secondary | ICD-10-CM | POA: Diagnosis not present

## 2016-12-14 DIAGNOSIS — R531 Weakness: Secondary | ICD-10-CM | POA: Diagnosis not present

## 2016-12-15 DIAGNOSIS — E1129 Type 2 diabetes mellitus with other diabetic kidney complication: Secondary | ICD-10-CM | POA: Diagnosis not present

## 2016-12-15 DIAGNOSIS — D509 Iron deficiency anemia, unspecified: Secondary | ICD-10-CM | POA: Diagnosis not present

## 2016-12-15 DIAGNOSIS — N186 End stage renal disease: Secondary | ICD-10-CM | POA: Diagnosis not present

## 2016-12-15 DIAGNOSIS — N2581 Secondary hyperparathyroidism of renal origin: Secondary | ICD-10-CM | POA: Diagnosis not present

## 2016-12-16 DIAGNOSIS — M79673 Pain in unspecified foot: Secondary | ICD-10-CM | POA: Diagnosis not present

## 2016-12-16 DIAGNOSIS — L603 Nail dystrophy: Secondary | ICD-10-CM | POA: Diagnosis not present

## 2016-12-16 DIAGNOSIS — E1121 Type 2 diabetes mellitus with diabetic nephropathy: Secondary | ICD-10-CM | POA: Diagnosis not present

## 2016-12-16 DIAGNOSIS — R2681 Unsteadiness on feet: Secondary | ICD-10-CM | POA: Diagnosis not present

## 2016-12-16 DIAGNOSIS — L851 Acquired keratosis [keratoderma] palmaris et plantaris: Secondary | ICD-10-CM | POA: Diagnosis not present

## 2016-12-17 DIAGNOSIS — D509 Iron deficiency anemia, unspecified: Secondary | ICD-10-CM | POA: Diagnosis not present

## 2016-12-17 DIAGNOSIS — N2581 Secondary hyperparathyroidism of renal origin: Secondary | ICD-10-CM | POA: Diagnosis not present

## 2016-12-17 DIAGNOSIS — N186 End stage renal disease: Secondary | ICD-10-CM | POA: Diagnosis not present

## 2016-12-17 DIAGNOSIS — E1129 Type 2 diabetes mellitus with other diabetic kidney complication: Secondary | ICD-10-CM | POA: Diagnosis not present

## 2016-12-19 DIAGNOSIS — D509 Iron deficiency anemia, unspecified: Secondary | ICD-10-CM | POA: Diagnosis not present

## 2016-12-19 DIAGNOSIS — N2581 Secondary hyperparathyroidism of renal origin: Secondary | ICD-10-CM | POA: Diagnosis not present

## 2016-12-19 DIAGNOSIS — N186 End stage renal disease: Secondary | ICD-10-CM | POA: Diagnosis not present

## 2016-12-19 DIAGNOSIS — E1129 Type 2 diabetes mellitus with other diabetic kidney complication: Secondary | ICD-10-CM | POA: Diagnosis not present

## 2016-12-22 DIAGNOSIS — E1129 Type 2 diabetes mellitus with other diabetic kidney complication: Secondary | ICD-10-CM | POA: Diagnosis not present

## 2016-12-22 DIAGNOSIS — E119 Type 2 diabetes mellitus without complications: Secondary | ICD-10-CM | POA: Diagnosis not present

## 2016-12-22 DIAGNOSIS — N2581 Secondary hyperparathyroidism of renal origin: Secondary | ICD-10-CM | POA: Diagnosis not present

## 2016-12-22 DIAGNOSIS — M1990 Unspecified osteoarthritis, unspecified site: Secondary | ICD-10-CM | POA: Diagnosis not present

## 2016-12-22 DIAGNOSIS — M25569 Pain in unspecified knee: Secondary | ICD-10-CM | POA: Diagnosis not present

## 2016-12-22 DIAGNOSIS — G8929 Other chronic pain: Secondary | ICD-10-CM | POA: Diagnosis not present

## 2016-12-22 DIAGNOSIS — I509 Heart failure, unspecified: Secondary | ICD-10-CM | POA: Diagnosis not present

## 2016-12-22 DIAGNOSIS — I69954 Hemiplegia and hemiparesis following unspecified cerebrovascular disease affecting left non-dominant side: Secondary | ICD-10-CM | POA: Diagnosis not present

## 2016-12-22 DIAGNOSIS — I10 Essential (primary) hypertension: Secondary | ICD-10-CM | POA: Diagnosis not present

## 2016-12-22 DIAGNOSIS — N186 End stage renal disease: Secondary | ICD-10-CM | POA: Diagnosis not present

## 2016-12-22 DIAGNOSIS — D509 Iron deficiency anemia, unspecified: Secondary | ICD-10-CM | POA: Diagnosis not present

## 2016-12-26 DIAGNOSIS — N2581 Secondary hyperparathyroidism of renal origin: Secondary | ICD-10-CM | POA: Diagnosis not present

## 2016-12-26 DIAGNOSIS — D509 Iron deficiency anemia, unspecified: Secondary | ICD-10-CM | POA: Diagnosis not present

## 2016-12-26 DIAGNOSIS — N186 End stage renal disease: Secondary | ICD-10-CM | POA: Diagnosis not present

## 2016-12-26 DIAGNOSIS — E1129 Type 2 diabetes mellitus with other diabetic kidney complication: Secondary | ICD-10-CM | POA: Diagnosis not present

## 2016-12-29 DIAGNOSIS — D509 Iron deficiency anemia, unspecified: Secondary | ICD-10-CM | POA: Diagnosis not present

## 2016-12-29 DIAGNOSIS — N186 End stage renal disease: Secondary | ICD-10-CM | POA: Diagnosis not present

## 2016-12-29 DIAGNOSIS — E1129 Type 2 diabetes mellitus with other diabetic kidney complication: Secondary | ICD-10-CM | POA: Diagnosis not present

## 2016-12-29 DIAGNOSIS — N2581 Secondary hyperparathyroidism of renal origin: Secondary | ICD-10-CM | POA: Diagnosis not present

## 2016-12-31 DIAGNOSIS — N2581 Secondary hyperparathyroidism of renal origin: Secondary | ICD-10-CM | POA: Diagnosis not present

## 2016-12-31 DIAGNOSIS — N186 End stage renal disease: Secondary | ICD-10-CM | POA: Diagnosis not present

## 2016-12-31 DIAGNOSIS — E1129 Type 2 diabetes mellitus with other diabetic kidney complication: Secondary | ICD-10-CM | POA: Diagnosis not present

## 2016-12-31 DIAGNOSIS — D509 Iron deficiency anemia, unspecified: Secondary | ICD-10-CM | POA: Diagnosis not present

## 2017-01-02 DIAGNOSIS — N2581 Secondary hyperparathyroidism of renal origin: Secondary | ICD-10-CM | POA: Diagnosis not present

## 2017-01-02 DIAGNOSIS — N186 End stage renal disease: Secondary | ICD-10-CM | POA: Diagnosis not present

## 2017-01-02 DIAGNOSIS — E1129 Type 2 diabetes mellitus with other diabetic kidney complication: Secondary | ICD-10-CM | POA: Diagnosis not present

## 2017-01-02 DIAGNOSIS — D509 Iron deficiency anemia, unspecified: Secondary | ICD-10-CM | POA: Diagnosis not present

## 2017-01-05 DIAGNOSIS — D509 Iron deficiency anemia, unspecified: Secondary | ICD-10-CM | POA: Diagnosis not present

## 2017-01-05 DIAGNOSIS — N2581 Secondary hyperparathyroidism of renal origin: Secondary | ICD-10-CM | POA: Diagnosis not present

## 2017-01-05 DIAGNOSIS — N186 End stage renal disease: Secondary | ICD-10-CM | POA: Diagnosis not present

## 2017-01-05 DIAGNOSIS — E1129 Type 2 diabetes mellitus with other diabetic kidney complication: Secondary | ICD-10-CM | POA: Diagnosis not present

## 2017-01-07 DIAGNOSIS — N2581 Secondary hyperparathyroidism of renal origin: Secondary | ICD-10-CM | POA: Diagnosis not present

## 2017-01-07 DIAGNOSIS — E1129 Type 2 diabetes mellitus with other diabetic kidney complication: Secondary | ICD-10-CM | POA: Diagnosis not present

## 2017-01-07 DIAGNOSIS — N186 End stage renal disease: Secondary | ICD-10-CM | POA: Diagnosis not present

## 2017-01-07 DIAGNOSIS — D509 Iron deficiency anemia, unspecified: Secondary | ICD-10-CM | POA: Diagnosis not present

## 2017-01-09 DIAGNOSIS — N186 End stage renal disease: Secondary | ICD-10-CM | POA: Diagnosis not present

## 2017-01-09 DIAGNOSIS — E1129 Type 2 diabetes mellitus with other diabetic kidney complication: Secondary | ICD-10-CM | POA: Diagnosis not present

## 2017-01-09 DIAGNOSIS — D509 Iron deficiency anemia, unspecified: Secondary | ICD-10-CM | POA: Diagnosis not present

## 2017-01-09 DIAGNOSIS — N2581 Secondary hyperparathyroidism of renal origin: Secondary | ICD-10-CM | POA: Diagnosis not present

## 2017-01-12 DIAGNOSIS — N186 End stage renal disease: Secondary | ICD-10-CM | POA: Diagnosis not present

## 2017-01-12 DIAGNOSIS — D509 Iron deficiency anemia, unspecified: Secondary | ICD-10-CM | POA: Diagnosis not present

## 2017-01-12 DIAGNOSIS — E1129 Type 2 diabetes mellitus with other diabetic kidney complication: Secondary | ICD-10-CM | POA: Diagnosis not present

## 2017-01-12 DIAGNOSIS — N2581 Secondary hyperparathyroidism of renal origin: Secondary | ICD-10-CM | POA: Diagnosis not present

## 2017-01-13 DIAGNOSIS — N186 End stage renal disease: Secondary | ICD-10-CM | POA: Diagnosis not present

## 2017-01-13 DIAGNOSIS — E1129 Type 2 diabetes mellitus with other diabetic kidney complication: Secondary | ICD-10-CM | POA: Diagnosis not present

## 2017-01-13 DIAGNOSIS — Z992 Dependence on renal dialysis: Secondary | ICD-10-CM | POA: Diagnosis not present

## 2017-01-14 DIAGNOSIS — D509 Iron deficiency anemia, unspecified: Secondary | ICD-10-CM | POA: Diagnosis not present

## 2017-01-14 DIAGNOSIS — N186 End stage renal disease: Secondary | ICD-10-CM | POA: Diagnosis not present

## 2017-01-14 DIAGNOSIS — N2581 Secondary hyperparathyroidism of renal origin: Secondary | ICD-10-CM | POA: Diagnosis not present

## 2017-01-14 DIAGNOSIS — D631 Anemia in chronic kidney disease: Secondary | ICD-10-CM | POA: Diagnosis not present

## 2017-01-14 DIAGNOSIS — E1129 Type 2 diabetes mellitus with other diabetic kidney complication: Secondary | ICD-10-CM | POA: Diagnosis not present

## 2017-01-16 DIAGNOSIS — E1129 Type 2 diabetes mellitus with other diabetic kidney complication: Secondary | ICD-10-CM | POA: Diagnosis not present

## 2017-01-16 DIAGNOSIS — N186 End stage renal disease: Secondary | ICD-10-CM | POA: Diagnosis not present

## 2017-01-16 DIAGNOSIS — N2581 Secondary hyperparathyroidism of renal origin: Secondary | ICD-10-CM | POA: Diagnosis not present

## 2017-01-16 DIAGNOSIS — D509 Iron deficiency anemia, unspecified: Secondary | ICD-10-CM | POA: Diagnosis not present

## 2017-01-16 DIAGNOSIS — D631 Anemia in chronic kidney disease: Secondary | ICD-10-CM | POA: Diagnosis not present

## 2017-01-19 DIAGNOSIS — R54 Age-related physical debility: Secondary | ICD-10-CM | POA: Diagnosis not present

## 2017-01-19 DIAGNOSIS — G2581 Restless legs syndrome: Secondary | ICD-10-CM | POA: Diagnosis not present

## 2017-01-19 DIAGNOSIS — I509 Heart failure, unspecified: Secondary | ICD-10-CM | POA: Diagnosis not present

## 2017-01-19 DIAGNOSIS — R531 Weakness: Secondary | ICD-10-CM | POA: Diagnosis not present

## 2017-01-19 DIAGNOSIS — I69954 Hemiplegia and hemiparesis following unspecified cerebrovascular disease affecting left non-dominant side: Secondary | ICD-10-CM | POA: Diagnosis not present

## 2017-01-19 DIAGNOSIS — E119 Type 2 diabetes mellitus without complications: Secondary | ICD-10-CM | POA: Diagnosis not present

## 2017-01-19 DIAGNOSIS — G8929 Other chronic pain: Secondary | ICD-10-CM | POA: Diagnosis not present

## 2017-01-19 DIAGNOSIS — N186 End stage renal disease: Secondary | ICD-10-CM | POA: Diagnosis not present

## 2017-01-21 DIAGNOSIS — E1129 Type 2 diabetes mellitus with other diabetic kidney complication: Secondary | ICD-10-CM | POA: Diagnosis not present

## 2017-01-21 DIAGNOSIS — N186 End stage renal disease: Secondary | ICD-10-CM | POA: Diagnosis not present

## 2017-01-21 DIAGNOSIS — D631 Anemia in chronic kidney disease: Secondary | ICD-10-CM | POA: Diagnosis not present

## 2017-01-21 DIAGNOSIS — D509 Iron deficiency anemia, unspecified: Secondary | ICD-10-CM | POA: Diagnosis not present

## 2017-01-21 DIAGNOSIS — N2581 Secondary hyperparathyroidism of renal origin: Secondary | ICD-10-CM | POA: Diagnosis not present

## 2017-01-23 DIAGNOSIS — N186 End stage renal disease: Secondary | ICD-10-CM | POA: Diagnosis not present

## 2017-01-23 DIAGNOSIS — N2581 Secondary hyperparathyroidism of renal origin: Secondary | ICD-10-CM | POA: Diagnosis not present

## 2017-01-23 DIAGNOSIS — E1129 Type 2 diabetes mellitus with other diabetic kidney complication: Secondary | ICD-10-CM | POA: Diagnosis not present

## 2017-01-23 DIAGNOSIS — D631 Anemia in chronic kidney disease: Secondary | ICD-10-CM | POA: Diagnosis not present

## 2017-01-23 DIAGNOSIS — D509 Iron deficiency anemia, unspecified: Secondary | ICD-10-CM | POA: Diagnosis not present

## 2017-01-26 DIAGNOSIS — D631 Anemia in chronic kidney disease: Secondary | ICD-10-CM | POA: Diagnosis not present

## 2017-01-26 DIAGNOSIS — E1129 Type 2 diabetes mellitus with other diabetic kidney complication: Secondary | ICD-10-CM | POA: Diagnosis not present

## 2017-01-26 DIAGNOSIS — N186 End stage renal disease: Secondary | ICD-10-CM | POA: Diagnosis not present

## 2017-01-26 DIAGNOSIS — D509 Iron deficiency anemia, unspecified: Secondary | ICD-10-CM | POA: Diagnosis not present

## 2017-01-26 DIAGNOSIS — N2581 Secondary hyperparathyroidism of renal origin: Secondary | ICD-10-CM | POA: Diagnosis not present

## 2017-01-27 DIAGNOSIS — Z79899 Other long term (current) drug therapy: Secondary | ICD-10-CM | POA: Diagnosis not present

## 2017-01-27 DIAGNOSIS — E119 Type 2 diabetes mellitus without complications: Secondary | ICD-10-CM | POA: Diagnosis not present

## 2017-01-28 DIAGNOSIS — D509 Iron deficiency anemia, unspecified: Secondary | ICD-10-CM | POA: Diagnosis not present

## 2017-01-28 DIAGNOSIS — D631 Anemia in chronic kidney disease: Secondary | ICD-10-CM | POA: Diagnosis not present

## 2017-01-28 DIAGNOSIS — N2581 Secondary hyperparathyroidism of renal origin: Secondary | ICD-10-CM | POA: Diagnosis not present

## 2017-01-28 DIAGNOSIS — E1129 Type 2 diabetes mellitus with other diabetic kidney complication: Secondary | ICD-10-CM | POA: Diagnosis not present

## 2017-01-28 DIAGNOSIS — N186 End stage renal disease: Secondary | ICD-10-CM | POA: Diagnosis not present

## 2017-01-30 DIAGNOSIS — N2581 Secondary hyperparathyroidism of renal origin: Secondary | ICD-10-CM | POA: Diagnosis not present

## 2017-01-30 DIAGNOSIS — E1129 Type 2 diabetes mellitus with other diabetic kidney complication: Secondary | ICD-10-CM | POA: Diagnosis not present

## 2017-01-30 DIAGNOSIS — D509 Iron deficiency anemia, unspecified: Secondary | ICD-10-CM | POA: Diagnosis not present

## 2017-01-30 DIAGNOSIS — N186 End stage renal disease: Secondary | ICD-10-CM | POA: Diagnosis not present

## 2017-01-30 DIAGNOSIS — D631 Anemia in chronic kidney disease: Secondary | ICD-10-CM | POA: Diagnosis not present

## 2017-02-01 DIAGNOSIS — N186 End stage renal disease: Secondary | ICD-10-CM | POA: Diagnosis not present

## 2017-02-01 DIAGNOSIS — N2581 Secondary hyperparathyroidism of renal origin: Secondary | ICD-10-CM | POA: Diagnosis not present

## 2017-02-01 DIAGNOSIS — D631 Anemia in chronic kidney disease: Secondary | ICD-10-CM | POA: Diagnosis not present

## 2017-02-01 DIAGNOSIS — D509 Iron deficiency anemia, unspecified: Secondary | ICD-10-CM | POA: Diagnosis not present

## 2017-02-01 DIAGNOSIS — E1129 Type 2 diabetes mellitus with other diabetic kidney complication: Secondary | ICD-10-CM | POA: Diagnosis not present

## 2017-02-03 DIAGNOSIS — N2581 Secondary hyperparathyroidism of renal origin: Secondary | ICD-10-CM | POA: Diagnosis not present

## 2017-02-03 DIAGNOSIS — N186 End stage renal disease: Secondary | ICD-10-CM | POA: Diagnosis not present

## 2017-02-03 DIAGNOSIS — D509 Iron deficiency anemia, unspecified: Secondary | ICD-10-CM | POA: Diagnosis not present

## 2017-02-03 DIAGNOSIS — E1129 Type 2 diabetes mellitus with other diabetic kidney complication: Secondary | ICD-10-CM | POA: Diagnosis not present

## 2017-02-03 DIAGNOSIS — D631 Anemia in chronic kidney disease: Secondary | ICD-10-CM | POA: Diagnosis not present

## 2017-02-06 DIAGNOSIS — N186 End stage renal disease: Secondary | ICD-10-CM | POA: Diagnosis not present

## 2017-02-06 DIAGNOSIS — N2581 Secondary hyperparathyroidism of renal origin: Secondary | ICD-10-CM | POA: Diagnosis not present

## 2017-02-06 DIAGNOSIS — D509 Iron deficiency anemia, unspecified: Secondary | ICD-10-CM | POA: Diagnosis not present

## 2017-02-06 DIAGNOSIS — E1129 Type 2 diabetes mellitus with other diabetic kidney complication: Secondary | ICD-10-CM | POA: Diagnosis not present

## 2017-02-06 DIAGNOSIS — D631 Anemia in chronic kidney disease: Secondary | ICD-10-CM | POA: Diagnosis not present

## 2017-02-09 DIAGNOSIS — N186 End stage renal disease: Secondary | ICD-10-CM | POA: Diagnosis not present

## 2017-02-09 DIAGNOSIS — E1129 Type 2 diabetes mellitus with other diabetic kidney complication: Secondary | ICD-10-CM | POA: Diagnosis not present

## 2017-02-09 DIAGNOSIS — D509 Iron deficiency anemia, unspecified: Secondary | ICD-10-CM | POA: Diagnosis not present

## 2017-02-09 DIAGNOSIS — N2581 Secondary hyperparathyroidism of renal origin: Secondary | ICD-10-CM | POA: Diagnosis not present

## 2017-02-09 DIAGNOSIS — D631 Anemia in chronic kidney disease: Secondary | ICD-10-CM | POA: Diagnosis not present

## 2017-02-11 DIAGNOSIS — D509 Iron deficiency anemia, unspecified: Secondary | ICD-10-CM | POA: Diagnosis not present

## 2017-02-11 DIAGNOSIS — D631 Anemia in chronic kidney disease: Secondary | ICD-10-CM | POA: Diagnosis not present

## 2017-02-11 DIAGNOSIS — E1129 Type 2 diabetes mellitus with other diabetic kidney complication: Secondary | ICD-10-CM | POA: Diagnosis not present

## 2017-02-11 DIAGNOSIS — N2581 Secondary hyperparathyroidism of renal origin: Secondary | ICD-10-CM | POA: Diagnosis not present

## 2017-02-11 DIAGNOSIS — N186 End stage renal disease: Secondary | ICD-10-CM | POA: Diagnosis not present

## 2017-02-12 DIAGNOSIS — Z992 Dependence on renal dialysis: Secondary | ICD-10-CM | POA: Diagnosis not present

## 2017-02-12 DIAGNOSIS — E1129 Type 2 diabetes mellitus with other diabetic kidney complication: Secondary | ICD-10-CM | POA: Diagnosis not present

## 2017-02-12 DIAGNOSIS — N186 End stage renal disease: Secondary | ICD-10-CM | POA: Diagnosis not present

## 2017-02-13 DIAGNOSIS — D631 Anemia in chronic kidney disease: Secondary | ICD-10-CM | POA: Diagnosis not present

## 2017-02-13 DIAGNOSIS — D509 Iron deficiency anemia, unspecified: Secondary | ICD-10-CM | POA: Diagnosis not present

## 2017-02-13 DIAGNOSIS — E1129 Type 2 diabetes mellitus with other diabetic kidney complication: Secondary | ICD-10-CM | POA: Diagnosis not present

## 2017-02-13 DIAGNOSIS — N186 End stage renal disease: Secondary | ICD-10-CM | POA: Diagnosis not present

## 2017-02-13 DIAGNOSIS — N2581 Secondary hyperparathyroidism of renal origin: Secondary | ICD-10-CM | POA: Diagnosis not present

## 2017-02-16 DIAGNOSIS — D509 Iron deficiency anemia, unspecified: Secondary | ICD-10-CM | POA: Diagnosis not present

## 2017-02-16 DIAGNOSIS — D631 Anemia in chronic kidney disease: Secondary | ICD-10-CM | POA: Diagnosis not present

## 2017-02-16 DIAGNOSIS — N2581 Secondary hyperparathyroidism of renal origin: Secondary | ICD-10-CM | POA: Diagnosis not present

## 2017-02-16 DIAGNOSIS — N186 End stage renal disease: Secondary | ICD-10-CM | POA: Diagnosis not present

## 2017-02-16 DIAGNOSIS — E1129 Type 2 diabetes mellitus with other diabetic kidney complication: Secondary | ICD-10-CM | POA: Diagnosis not present

## 2017-02-18 DIAGNOSIS — D631 Anemia in chronic kidney disease: Secondary | ICD-10-CM | POA: Diagnosis not present

## 2017-02-18 DIAGNOSIS — N186 End stage renal disease: Secondary | ICD-10-CM | POA: Diagnosis not present

## 2017-02-18 DIAGNOSIS — E1129 Type 2 diabetes mellitus with other diabetic kidney complication: Secondary | ICD-10-CM | POA: Diagnosis not present

## 2017-02-18 DIAGNOSIS — D509 Iron deficiency anemia, unspecified: Secondary | ICD-10-CM | POA: Diagnosis not present

## 2017-02-18 DIAGNOSIS — N2581 Secondary hyperparathyroidism of renal origin: Secondary | ICD-10-CM | POA: Diagnosis not present

## 2017-02-20 DIAGNOSIS — E1129 Type 2 diabetes mellitus with other diabetic kidney complication: Secondary | ICD-10-CM | POA: Diagnosis not present

## 2017-02-20 DIAGNOSIS — N186 End stage renal disease: Secondary | ICD-10-CM | POA: Diagnosis not present

## 2017-02-20 DIAGNOSIS — D509 Iron deficiency anemia, unspecified: Secondary | ICD-10-CM | POA: Diagnosis not present

## 2017-02-20 DIAGNOSIS — N2581 Secondary hyperparathyroidism of renal origin: Secondary | ICD-10-CM | POA: Diagnosis not present

## 2017-02-20 DIAGNOSIS — D631 Anemia in chronic kidney disease: Secondary | ICD-10-CM | POA: Diagnosis not present

## 2017-02-24 DIAGNOSIS — M79673 Pain in unspecified foot: Secondary | ICD-10-CM | POA: Diagnosis not present

## 2017-02-24 DIAGNOSIS — H409 Unspecified glaucoma: Secondary | ICD-10-CM | POA: Diagnosis not present

## 2017-02-24 DIAGNOSIS — L603 Nail dystrophy: Secondary | ICD-10-CM | POA: Diagnosis not present

## 2017-02-24 DIAGNOSIS — I509 Heart failure, unspecified: Secondary | ICD-10-CM | POA: Diagnosis not present

## 2017-02-24 DIAGNOSIS — N186 End stage renal disease: Secondary | ICD-10-CM | POA: Diagnosis not present

## 2017-02-24 DIAGNOSIS — L851 Acquired keratosis [keratoderma] palmaris et plantaris: Secondary | ICD-10-CM | POA: Diagnosis not present

## 2017-02-24 DIAGNOSIS — L0292 Furuncle, unspecified: Secondary | ICD-10-CM | POA: Diagnosis not present

## 2017-02-24 DIAGNOSIS — G2581 Restless legs syndrome: Secondary | ICD-10-CM | POA: Diagnosis not present

## 2017-02-24 DIAGNOSIS — I69954 Hemiplegia and hemiparesis following unspecified cerebrovascular disease affecting left non-dominant side: Secondary | ICD-10-CM | POA: Diagnosis not present

## 2017-02-24 DIAGNOSIS — R2689 Other abnormalities of gait and mobility: Secondary | ICD-10-CM | POA: Diagnosis not present

## 2017-02-24 DIAGNOSIS — M25569 Pain in unspecified knee: Secondary | ICD-10-CM | POA: Diagnosis not present

## 2017-02-24 DIAGNOSIS — E1121 Type 2 diabetes mellitus with diabetic nephropathy: Secondary | ICD-10-CM | POA: Diagnosis not present

## 2017-02-24 DIAGNOSIS — M1990 Unspecified osteoarthritis, unspecified site: Secondary | ICD-10-CM | POA: Diagnosis not present

## 2017-02-25 DIAGNOSIS — D631 Anemia in chronic kidney disease: Secondary | ICD-10-CM | POA: Diagnosis not present

## 2017-02-25 DIAGNOSIS — E1129 Type 2 diabetes mellitus with other diabetic kidney complication: Secondary | ICD-10-CM | POA: Diagnosis not present

## 2017-02-25 DIAGNOSIS — D509 Iron deficiency anemia, unspecified: Secondary | ICD-10-CM | POA: Diagnosis not present

## 2017-02-25 DIAGNOSIS — N186 End stage renal disease: Secondary | ICD-10-CM | POA: Diagnosis not present

## 2017-02-25 DIAGNOSIS — N2581 Secondary hyperparathyroidism of renal origin: Secondary | ICD-10-CM | POA: Diagnosis not present

## 2017-02-27 DIAGNOSIS — E1129 Type 2 diabetes mellitus with other diabetic kidney complication: Secondary | ICD-10-CM | POA: Diagnosis not present

## 2017-02-27 DIAGNOSIS — N2581 Secondary hyperparathyroidism of renal origin: Secondary | ICD-10-CM | POA: Diagnosis not present

## 2017-02-27 DIAGNOSIS — D509 Iron deficiency anemia, unspecified: Secondary | ICD-10-CM | POA: Diagnosis not present

## 2017-02-27 DIAGNOSIS — D631 Anemia in chronic kidney disease: Secondary | ICD-10-CM | POA: Diagnosis not present

## 2017-02-27 DIAGNOSIS — N186 End stage renal disease: Secondary | ICD-10-CM | POA: Diagnosis not present

## 2017-03-02 DIAGNOSIS — N2581 Secondary hyperparathyroidism of renal origin: Secondary | ICD-10-CM | POA: Diagnosis not present

## 2017-03-02 DIAGNOSIS — N186 End stage renal disease: Secondary | ICD-10-CM | POA: Diagnosis not present

## 2017-03-02 DIAGNOSIS — D631 Anemia in chronic kidney disease: Secondary | ICD-10-CM | POA: Diagnosis not present

## 2017-03-02 DIAGNOSIS — E1129 Type 2 diabetes mellitus with other diabetic kidney complication: Secondary | ICD-10-CM | POA: Diagnosis not present

## 2017-03-02 DIAGNOSIS — D509 Iron deficiency anemia, unspecified: Secondary | ICD-10-CM | POA: Diagnosis not present

## 2017-03-04 DIAGNOSIS — D509 Iron deficiency anemia, unspecified: Secondary | ICD-10-CM | POA: Diagnosis not present

## 2017-03-04 DIAGNOSIS — E1129 Type 2 diabetes mellitus with other diabetic kidney complication: Secondary | ICD-10-CM | POA: Diagnosis not present

## 2017-03-04 DIAGNOSIS — D631 Anemia in chronic kidney disease: Secondary | ICD-10-CM | POA: Diagnosis not present

## 2017-03-04 DIAGNOSIS — N186 End stage renal disease: Secondary | ICD-10-CM | POA: Diagnosis not present

## 2017-03-04 DIAGNOSIS — N2581 Secondary hyperparathyroidism of renal origin: Secondary | ICD-10-CM | POA: Diagnosis not present

## 2017-03-06 DIAGNOSIS — D631 Anemia in chronic kidney disease: Secondary | ICD-10-CM | POA: Diagnosis not present

## 2017-03-06 DIAGNOSIS — N186 End stage renal disease: Secondary | ICD-10-CM | POA: Diagnosis not present

## 2017-03-06 DIAGNOSIS — D509 Iron deficiency anemia, unspecified: Secondary | ICD-10-CM | POA: Diagnosis not present

## 2017-03-06 DIAGNOSIS — N2581 Secondary hyperparathyroidism of renal origin: Secondary | ICD-10-CM | POA: Diagnosis not present

## 2017-03-06 DIAGNOSIS — E1129 Type 2 diabetes mellitus with other diabetic kidney complication: Secondary | ICD-10-CM | POA: Diagnosis not present

## 2017-03-08 DIAGNOSIS — D509 Iron deficiency anemia, unspecified: Secondary | ICD-10-CM | POA: Diagnosis not present

## 2017-03-08 DIAGNOSIS — E1129 Type 2 diabetes mellitus with other diabetic kidney complication: Secondary | ICD-10-CM | POA: Diagnosis not present

## 2017-03-08 DIAGNOSIS — D631 Anemia in chronic kidney disease: Secondary | ICD-10-CM | POA: Diagnosis not present

## 2017-03-08 DIAGNOSIS — N186 End stage renal disease: Secondary | ICD-10-CM | POA: Diagnosis not present

## 2017-03-08 DIAGNOSIS — N2581 Secondary hyperparathyroidism of renal origin: Secondary | ICD-10-CM | POA: Diagnosis not present

## 2017-03-11 DIAGNOSIS — N2581 Secondary hyperparathyroidism of renal origin: Secondary | ICD-10-CM | POA: Diagnosis not present

## 2017-03-11 DIAGNOSIS — E1129 Type 2 diabetes mellitus with other diabetic kidney complication: Secondary | ICD-10-CM | POA: Diagnosis not present

## 2017-03-11 DIAGNOSIS — N186 End stage renal disease: Secondary | ICD-10-CM | POA: Diagnosis not present

## 2017-03-11 DIAGNOSIS — D509 Iron deficiency anemia, unspecified: Secondary | ICD-10-CM | POA: Diagnosis not present

## 2017-03-11 DIAGNOSIS — D631 Anemia in chronic kidney disease: Secondary | ICD-10-CM | POA: Diagnosis not present

## 2017-03-13 DIAGNOSIS — E1129 Type 2 diabetes mellitus with other diabetic kidney complication: Secondary | ICD-10-CM | POA: Diagnosis not present

## 2017-03-13 DIAGNOSIS — N186 End stage renal disease: Secondary | ICD-10-CM | POA: Diagnosis not present

## 2017-03-13 DIAGNOSIS — D631 Anemia in chronic kidney disease: Secondary | ICD-10-CM | POA: Diagnosis not present

## 2017-03-13 DIAGNOSIS — D509 Iron deficiency anemia, unspecified: Secondary | ICD-10-CM | POA: Diagnosis not present

## 2017-03-13 DIAGNOSIS — N2581 Secondary hyperparathyroidism of renal origin: Secondary | ICD-10-CM | POA: Diagnosis not present

## 2017-03-15 DIAGNOSIS — D509 Iron deficiency anemia, unspecified: Secondary | ICD-10-CM | POA: Diagnosis not present

## 2017-03-15 DIAGNOSIS — Z992 Dependence on renal dialysis: Secondary | ICD-10-CM | POA: Diagnosis not present

## 2017-03-15 DIAGNOSIS — D631 Anemia in chronic kidney disease: Secondary | ICD-10-CM | POA: Diagnosis not present

## 2017-03-15 DIAGNOSIS — E1129 Type 2 diabetes mellitus with other diabetic kidney complication: Secondary | ICD-10-CM | POA: Diagnosis not present

## 2017-03-15 DIAGNOSIS — N186 End stage renal disease: Secondary | ICD-10-CM | POA: Diagnosis not present

## 2017-03-15 DIAGNOSIS — N2581 Secondary hyperparathyroidism of renal origin: Secondary | ICD-10-CM | POA: Diagnosis not present

## 2017-07-26 ENCOUNTER — Non-Acute Institutional Stay: Payer: Medicare Other | Admitting: Internal Medicine

## 2017-07-26 DIAGNOSIS — R531 Weakness: Secondary | ICD-10-CM

## 2017-07-27 NOTE — Progress Notes (Signed)
PALLIATIVE CARE CONSULT VISIT   PATIENT NAME: Stefanie [name] DOB: Feb 25, 1946 MRN: [id number]  PRIMARY CARE PROVIDER:   Raelyn Number, MD  REFERRING PROVIDER:     Lesia Hausen, PA   Raelyn Number, MD [address] [place], [place] [address]  RESPONSIBLE PARTYPhebe Colla Mack(daughter) [phone]   RECOMMENDATIONS and PLAN:  1.  Weakness, generalized  R53.1:  Chronic and at baseline per staff.  Continues hemodialysis 3x/wk and participates in activities.  Continue to monitor.  2. Advanced care planning/Goals:  DNR/DNI.  Maintain current living arrangement and quality of life. Palliative care will continue to follow.   I spent 15 minutes providing this consultation,  from 3:15pm to 3:30pm at [facility].   More than 50% of the time in this consultation was spent coordinating communication with patient and facility staff.   HISTORY OF PRESENT ILLNESS:  Follow-up with  Stefanie S Mooradian finds that she has been in stable health without acute illness or hospitalizations.  She remains in a generalized state of weakness and assists with her ADLs.  She continues hemodialysis treatments on T,TH,SAT.  Palliative Care was asked to help address goals of care.   CODE STATUS: DNR  PPS: 40%  Mobile via [place] ELIGIBILITY/DIAGNOSIS: TBD  PAST MEDICAL HISTORY:  Past Medical History:  Diagnosis Date  . Anemia   . CAD (coronary artery disease)   . CHF (congestive heart failure) ([place])   . Constipation   . CVA (cerebral vascular accident) ([place]) [place]   left sided weakness  . Diabetes mellitus   . End stage renal disease ([place])    initiated HD 2006  . Hx of cardiovascular stress test    a. MV 03/2010:  no ischemia, EF 55%  . Hx of echocardiogram    a. Echo 03/2010: EF 55-60%, Gr 1 diast dysfn, trivial MS, mild to mod LAE, PASP 34, ;  b. Echo 11/13: mild LVH, EF 60%, Gr 1 diast dysfn, Ao sclerosis, no AS, MAC, slight MS, mean 3 mmHg, PASP 34  . Hyperlipidemia   .  Hypertension   . Protein malnutrition ([place])   . Renal insufficiency   . Retinal detachment, bilateral   . Secondary hyperparathyroidism ([place])     SOCIAL HX:  Social History   Tobacco Use  . Smoking status: Former Smoker    Last attempt to quit: 03/17/1983    Years since quitting: 34.3  . Smokeless tobacco: Never Used  Substance Use Topics  . Alcohol use: No    ALLERGIES: No Known Allergies   PERTINENT MEDICATIONS:  Outpatient Encounter Medications as of 07/26/2017  Medication Sig  . acetaminophen (TYLENOL) 325 MG tablet Take 650 mg by mouth 2 (two) times daily.  . calcium acetate (PHOSLO) 667 MG capsule Take 667-2,001 mg by mouth See admin instructions. Take 3 capsules three times a day with meals and take 1 capsule twice daily with snacks  . diclofenac sodium (VOLTAREN) 1 % GEL Apply 2 g topically See admin instructions. Apply to both knees daily as needed for pain and to right hand 2-3 times a day as needed for pain (BRAND NAME VOLTAREN)  . glipiZIDE (GLUCOTROL) 10 MG tablet Take 10 mg by mouth daily before breakfast.  . latanoprost (XALATAN) 0.005 % ophthalmic solution Place 1 drop into the left eye at bedtime.  . lidocaine-prilocaine (EMLA) cream Apply 1 application topically as needed (topical anesthesia for hemodialysis if Gebauers and Lidocaine injection are ineffective.). (Patient taking  differently: Apply 1 application topically 3 (three) times a week. )  . lisinopril (PRINIVIL,ZESTRIL) 30 MG tablet Take 30 mg by mouth every morning.   . loratadine (CLARITIN) 10 MG tablet Take 10 mg by mouth daily.  Marland Kitchen oxyCODONE (OXY IR/ROXICODONE) 5 MG immediate release tablet Take 2.5 mg by mouth 2 (two) times daily.   . polyethylene glycol (MIRALAX) packet Use 17 gms three times daily until bowels move, with maximum of 3 consecutive days (Patient taking differently: Take 17 g by mouth See admin instructions. On Tuesday, Thursday and Saturday as needed)  . pravastatin (PRAVACHOL) 20 MG tablet  Take 20 mg by mouth at bedtime.  Marland Kitchen rOPINIRole (REQUIP) 0.5 MG tablet Take 0.5 mg by mouth at bedtime.  . sennosides-docusate sodium (SENOKOT-S) 8.6-50 MG tablet Take 2 tablets by mouth daily as needed for constipation.   . sevelamer carbonate (RENVELA) 800 MG tablet Take 2,400-4,800 mg by mouth See admin instructions. Take 4800 mg three times a day with meals and 2400 mg twice daily with snacks  . sorbitol 70 % solution Take 30 mLs by mouth daily as needed (for constipation).   No facility-administered encounter medications on file as of 07/26/2017.     PHYSICAL EXAM:   General: Well nourished elderly female sitting in wheelchair and in NAD Cardiovascular: regular rate and rhythm Pulmonary: Clear throughout Abdomen: soft, nontender, + bowel sounds Extremities: Trace generalized edema of BLE Skin: Hyperpigmentation distal BLE.  Exposed skin is inntact Neurological: A & O x3.  Rt. Hemiparesis Psych:  Appropriate mood.  Calm and cooperative.  Gonzella Lex, NP-C

## 2017-07-29 ENCOUNTER — Emergency Department (HOSPITAL_COMMUNITY): Payer: Medicare Other

## 2017-07-29 ENCOUNTER — Inpatient Hospital Stay (HOSPITAL_COMMUNITY)
Admission: EM | Admit: 2017-07-29 | Discharge: 2017-08-05 | DRG: 391 | Disposition: A | Discharge status: Home Health | Payer: Medicare Other | Source: Skilled Nursing Facility | Attending: Internal Medicine | Admitting: Internal Medicine

## 2017-07-29 ENCOUNTER — Encounter (HOSPITAL_COMMUNITY): Payer: Self-pay

## 2017-07-29 DIAGNOSIS — J690 Pneumonitis due to inhalation of food and vomit: Secondary | ICD-10-CM | POA: Diagnosis not present

## 2017-07-29 DIAGNOSIS — Z8249 Family history of ischemic heart disease and other diseases of the circulatory system: Secondary | ICD-10-CM

## 2017-07-29 DIAGNOSIS — R531 Weakness: Secondary | ICD-10-CM

## 2017-07-29 DIAGNOSIS — K449 Diaphragmatic hernia without obstruction or gangrene: Secondary | ICD-10-CM | POA: Diagnosis present

## 2017-07-29 DIAGNOSIS — I10 Essential (primary) hypertension: Secondary | ICD-10-CM | POA: Diagnosis present

## 2017-07-29 DIAGNOSIS — Z87891 Personal history of nicotine dependence: Secondary | ICD-10-CM

## 2017-07-29 DIAGNOSIS — K210 Gastro-esophageal reflux disease with esophagitis: Secondary | ICD-10-CM | POA: Diagnosis not present

## 2017-07-29 DIAGNOSIS — K3184 Gastroparesis: Secondary | ICD-10-CM | POA: Diagnosis present

## 2017-07-29 DIAGNOSIS — R0989 Other specified symptoms and signs involving the circulatory and respiratory systems: Secondary | ICD-10-CM

## 2017-07-29 DIAGNOSIS — I5042 Chronic combined systolic (congestive) and diastolic (congestive) heart failure: Secondary | ICD-10-CM | POA: Diagnosis present

## 2017-07-29 DIAGNOSIS — E1140 Type 2 diabetes mellitus with diabetic neuropathy, unspecified: Secondary | ICD-10-CM | POA: Diagnosis present

## 2017-07-29 DIAGNOSIS — K228 Other specified diseases of esophagus: Secondary | ICD-10-CM | POA: Diagnosis present

## 2017-07-29 DIAGNOSIS — E1143 Type 2 diabetes mellitus with diabetic autonomic (poly)neuropathy: Secondary | ICD-10-CM | POA: Diagnosis present

## 2017-07-29 DIAGNOSIS — I2510 Atherosclerotic heart disease of native coronary artery without angina pectoris: Secondary | ICD-10-CM | POA: Diagnosis present

## 2017-07-29 DIAGNOSIS — E1122 Type 2 diabetes mellitus with diabetic chronic kidney disease: Secondary | ICD-10-CM | POA: Diagnosis present

## 2017-07-29 DIAGNOSIS — N186 End stage renal disease: Secondary | ICD-10-CM

## 2017-07-29 DIAGNOSIS — N2581 Secondary hyperparathyroidism of renal origin: Secondary | ICD-10-CM | POA: Diagnosis present

## 2017-07-29 DIAGNOSIS — Z993 Dependence on wheelchair: Secondary | ICD-10-CM

## 2017-07-29 DIAGNOSIS — Z8673 Personal history of transient ischemic attack (TIA), and cerebral infarction without residual deficits: Secondary | ICD-10-CM

## 2017-07-29 DIAGNOSIS — I420 Dilated cardiomyopathy: Secondary | ICD-10-CM | POA: Diagnosis present

## 2017-07-29 DIAGNOSIS — E1151 Type 2 diabetes mellitus with diabetic peripheral angiopathy without gangrene: Secondary | ICD-10-CM | POA: Diagnosis present

## 2017-07-29 DIAGNOSIS — I959 Hypotension, unspecified: Secondary | ICD-10-CM | POA: Diagnosis present

## 2017-07-29 DIAGNOSIS — R112 Nausea with vomiting, unspecified: Secondary | ICD-10-CM | POA: Diagnosis not present

## 2017-07-29 DIAGNOSIS — E860 Dehydration: Secondary | ICD-10-CM | POA: Diagnosis present

## 2017-07-29 DIAGNOSIS — Z992 Dependence on renal dialysis: Secondary | ICD-10-CM

## 2017-07-29 DIAGNOSIS — Z7984 Long term (current) use of oral hypoglycemic drugs: Secondary | ICD-10-CM

## 2017-07-29 DIAGNOSIS — Z833 Family history of diabetes mellitus: Secondary | ICD-10-CM

## 2017-07-29 DIAGNOSIS — E1165 Type 2 diabetes mellitus with hyperglycemia: Secondary | ICD-10-CM | POA: Diagnosis present

## 2017-07-29 DIAGNOSIS — I132 Hypertensive heart and chronic kidney disease with heart failure and with stage 5 chronic kidney disease, or end stage renal disease: Secondary | ICD-10-CM | POA: Diagnosis present

## 2017-07-29 DIAGNOSIS — K5900 Constipation, unspecified: Secondary | ICD-10-CM | POA: Diagnosis present

## 2017-07-29 DIAGNOSIS — D649 Anemia, unspecified: Secondary | ICD-10-CM | POA: Diagnosis present

## 2017-07-29 DIAGNOSIS — E872 Acidosis: Secondary | ICD-10-CM | POA: Diagnosis present

## 2017-07-29 DIAGNOSIS — E785 Hyperlipidemia, unspecified: Secondary | ICD-10-CM | POA: Diagnosis present

## 2017-07-29 LAB — COMPREHENSIVE METABOLIC PANEL
ALBUMIN: 4.5 g/dL (ref 3.5–5.0)
ALT: 20 U/L (ref 14–54)
AST: 38 U/L (ref 15–41)
Alkaline Phosphatase: 98 U/L (ref 38–126)
Anion gap: 18 — ABNORMAL HIGH (ref 5–15)
BUN: 5 mg/dL — ABNORMAL LOW (ref 6–20)
CHLORIDE: 87 mmol/L — AB (ref 101–111)
CO2: 35 mmol/L — AB (ref 22–32)
Calcium: 10.8 mg/dL — ABNORMAL HIGH (ref 8.9–10.3)
Creatinine, Ser: 4.23 mg/dL — ABNORMAL HIGH (ref 0.44–1.00)
GFR calc Af Amer: 11 mL/min — ABNORMAL LOW (ref >60)
GFR calc non Af Amer: 10 mL/min — ABNORMAL LOW (ref >60)
GLUCOSE: 175 mg/dL — AB (ref 65–99)
POTASSIUM: 3.7 mmol/L (ref 3.5–5.1)
SODIUM: 140 mmol/L (ref 135–145)
Total Bilirubin: 0.9 mg/dL (ref 0.3–1.2)
Total Protein: 8.4 g/dL — ABNORMAL HIGH (ref 6.5–8.1)

## 2017-07-29 LAB — CBC
HEMATOCRIT: 38.1 % (ref 36.0–46.0)
HEMOGLOBIN: 11.7 g/dL — AB (ref 12.0–15.0)
MCH: 31.2 pg (ref 26.0–34.0)
MCHC: 30.7 g/dL (ref 30.0–36.0)
MCV: 101.6 fL — AB (ref 78.0–100.0)
Platelets: 140 10*3/uL — ABNORMAL LOW (ref 150–400)
RBC: 3.75 MIL/uL — ABNORMAL LOW (ref 3.87–5.11)
RDW: 17 % — ABNORMAL HIGH (ref 11.5–15.5)
WBC: 6.3 10*3/uL (ref 4.0–10.5)

## 2017-07-29 LAB — LIPASE, BLOOD: Lipase: 26 U/L (ref 11–51)

## 2017-07-29 LAB — I-STAT TROPONIN, ED: Troponin i, poc: 0.04 ng/mL (ref 0.00–0.08)

## 2017-07-29 LAB — I-STAT CG4 LACTIC ACID, ED
Lactic Acid, Venous: 2.38 mmol/L (ref 0.5–1.9)
Lactic Acid, Venous: 2.4 mmol/L (ref 0.5–1.9)

## 2017-07-29 MED ORDER — ONDANSETRON HCL 4 MG/2ML IJ SOLN
4.0000 mg | Freq: Once | INTRAMUSCULAR | Status: AC
  Administered 2017-07-29: 4 mg via INTRAVENOUS
  Filled 2017-07-29: qty 2

## 2017-07-29 MED ORDER — IOHEXOL 300 MG/ML  SOLN
100.0000 mL | Freq: Once | INTRAMUSCULAR | Status: AC | PRN
  Administered 2017-07-29: 100 mL via INTRAVENOUS

## 2017-07-29 MED ORDER — SODIUM CHLORIDE 0.9 % IV BOLUS
500.0000 mL | Freq: Once | INTRAVENOUS | Status: AC
  Administered 2017-07-29: 500 mL via INTRAVENOUS

## 2017-07-29 NOTE — ED Triage Notes (Signed)
Pt presents from [facility] with complaints of n/v x several days with abdominal pain upon palpation. Pt reports last BM 5 days ago. Abdomen soft.   Dialysis TTS- last went today. Access to LEFT thigh

## 2017-07-29 NOTE — ED Provider Notes (Addendum)
[facility] EMERGENCY DEPARTMENT Provider Note   CSN: [id number] Arrival date & time: 07/29/17  1604     History   Chief Complaint Chief Complaint  Patient presents with  . Abdominal Pain  . Emesis    HPI Stefanie [name] is a 72 y.o. female.  HPI 72 year old female end-stage renal disease on dialysis Tuesday Thursday Saturday presents today with 2 days of nausea and vomiting, epigastric discomfort, and no bowel movement for 5 days.  She states she has had very poor p.o. intake over the past 2 days due to the nausea.  She did go to her dialysis session today.  She has not noted fever or chills.  She denies any change in respiratory status or chest pain.  She reports nonbilious nonbloody emesis.  She states she has some epigastric discomfort that started after the vomiting.  She denies having any similar symptoms in the past.  She reports only having bilateral tubal ligation in the past. Past Medical History:  Diagnosis Date  . Anemia   . CAD (coronary artery disease)   . CHF (congestive heart failure) ([place])   . Constipation   . CVA (cerebral vascular accident) ([place]) [place]   left sided weakness  . Diabetes mellitus   . End stage renal disease ([place])    initiated HD 2006  . Hx of cardiovascular stress test    a. MV 03/2010:  no ischemia, EF 55%  . Hx of echocardiogram    a. Echo 03/2010: EF 55-60%, Gr 1 diast dysfn, trivial MS, mild to mod LAE, PASP 34, ;  b. Echo 11/13: mild LVH, EF 60%, Gr 1 diast dysfn, Ao sclerosis, no AS, MAC, slight MS, mean 3 mmHg, PASP 34  . Hyperlipidemia   . Hypertension   . Protein malnutrition ([place])   . Renal insufficiency   . Retinal detachment, bilateral   . Secondary hyperparathyroidism [facility])     Patient Active Problem List   Diagnosis Date Noted  . Acute blood loss anemia 02/09/2016  . Hypotension due to blood loss 02/09/2016  . Chronic combined systolic and diastolic CHF (congestive heart failure) ([place]) 02/09/2016    . Hemorrhage 02/08/2016  . Anemia 02/08/2016  . Left leg pain 04/10/2015  . Bacteremia 04/10/2015  . Lactic acidosis   . Cellulitis 04/09/2015  . History of CVA with residual deficit 02/11/2015  . Weakness generalized 02/08/2015  . DVT (deep venous thrombosis) ([place])   . CHF (congestive heart failure) ([place])   . Chronic diastolic congestive heart failure ([place])   . ESRD (end stage renal disease) ([place])   . Congestive dilated cardiomyopathy ([place])   . Left leg DVT ([place]) 02/01/2015  . Leukocytosis 02/01/2015  . Elevated troponin 02/01/2015  . SOB (shortness of breath) 02/01/2015  . Type 2 diabetes, controlled, with neuropathy ([place]) 12/20/2014  . Pain due to onychomycosis of toenail 12/20/2014  . 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 Mechanical complication of other vascular device, implant, and graft 05/19/2013  . PAD (peripheral artery disease) ([place]) 02/24/2012  . Murmur 12/24/2011  . Bradycardia-intermittent sinus 07/10/2010  . DYSPNEA ON EXERTION 02/25/2010  . CHEST PAIN, EXERTIONAL 02/25/2010  . DM 02/24/2010  . PROTEIN MALNUTRITION 02/24/2010  . HLD (hyperlipidemia) 02/24/2010  . ANEMIA 02/24/2010  . Essential hypertension 02/24/2010  . Cerebral artery occlusion with cerebral infarction ([place]) 02/24/2010  . ESRD on dialysis ([place]) 02/24/2010  . Secondary renal hyperparathyroidism ([place]) 02/24/2010    Past Surgical History:  Procedure Laterality  Date  . ARTERIOVENOUS GRAFT PLACEMENT     BUE numerous times  . ARTERIOVENOUS GRAFT PLACEMENT  11/13/10   Lt femoral - Dr. Kellie Simmering  . CARDIAC CATHETERIZATION N/A 02/05/2015   Procedure: Left Heart Cath and Coronary Angiography;  Surgeon: Jettie Booze, MD;  Location: [facility] CV LAB;  Service: Cardiovascular;  Laterality: N/A;  . CATARACT EXTRACTION    . DG AV DIALYSIS GRAFT DECLOT OR  06/06/11, 08/26/11   performed in IR  . PERIPHERAL VASCULAR CATHETERIZATION N/A 02/06/2015   Procedure: A/V Shuntogram;  Surgeon: Elam Dutch, MD;  Location: [facility] CV LAB;  Service: Cardiovascular;  Laterality: N/A;  . PERIPHERAL VASCULAR CATHETERIZATION Left 02/26/2016   Procedure: A/V Shuntogram;  Surgeon: Waynetta Sandy, MD;  Location: [facility] CV LAB;  Service: Cardiovascular;  Laterality: Left;  . PERIPHERAL VASCULAR CATHETERIZATION Left 02/26/2016   Procedure: Peripheral Vascular Balloon Angioplasty;  Surgeon: Waynetta Sandy, MD;  Location: [facility] CV LAB;  Service: Cardiovascular;  Laterality: Left;  Lt thigh avf  . SHUNTOGRAM N/A 05/29/2013   Procedure: Earney Mallet;  Surgeon: Conrad [place], MD;  Location: [facility] CATH LAB;  Service: Cardiovascular;  Laterality: N/A;  . SHUNTOGRAM Left 03/01/2014   Procedure: Earney Mallet;  Surgeon: Conrad [place], MD;  Location: [facility] CATH LAB;  Service: Cardiovascular;  Laterality: Left;  . TUBAL LIGATION       OB History   None      Home Medications    Prior to Admission medications   Medication Sig Start Date End Date Taking? Authorizing Provider  acetaminophen (TYLENOL) 325 MG tablet Take 650 mg by mouth 2 (two) times daily.    [provider]  calcium acetate (PHOSLO) 667 MG capsule Take 667-2,001 mg by mouth See admin instructions. Take 3 capsules three times a day with meals and take 1 capsule twice daily with snacks    [provider]  diclofenac sodium (VOLTAREN) 1 % GEL Apply 2 g topically See admin instructions. Apply to both knees daily as needed for pain and to right hand 2-3 times a day as needed for pain (BRAND NAME VOLTAREN)    [provider]  glipiZIDE (GLUCOTROL) 10 MG tablet Take 10 mg by mouth daily before breakfast.    [provider]  latanoprost (XALATAN) 0.005 % ophthalmic solution Place 1 drop into the left eye at bedtime.    [provider]  lidocaine-prilocaine (EMLA) cream Apply 1 application topically as needed (topical anesthesia for hemodialysis if Gebauers and Lidocaine injection are  ineffective.). Patient taking differently: Apply 1 application topically 3 (three) times a week.  02/25/15   Love, Ivan Anchors, PA-C  lisinopril (PRINIVIL,ZESTRIL) 30 MG tablet Take 30 mg by mouth every morning.     [provider]  loratadine (CLARITIN) 10 MG tablet Take 10 mg by mouth daily.    [provider]  oxyCODONE (OXY IR/ROXICODONE) 5 MG immediate release tablet Take 2.5 mg by mouth 2 (two) times daily.     [provider]  polyethylene glycol (MIRALAX) packet Use 17 gms three times daily until bowels move, with maximum of 3 consecutive days Patient taking differently: Take 17 g by mouth See admin instructions. On Tuesday, Thursday and Saturday as needed 09/02/15   Charlann Lange, PA-C  pravastatin (PRAVACHOL) 20 MG tablet Take 20 mg by mouth at bedtime.    [provider]  rOPINIRole (REQUIP) 0.5 MG tablet Take 0.5 mg by mouth at bedtime.  [provider]  sennosides-docusate sodium (SENOKOT-S) 8.6-50 MG tablet Take 2 tablets by mouth daily as needed for constipation.     [provider]  sevelamer carbonate (RENVELA) 800 MG tablet Take 2,400-4,800 mg by mouth See admin instructions. Take 4800 mg three times a day with meals and 2400 mg twice daily with snacks    [provider]  sorbitol 70 % solution Take 30 mLs by mouth daily as needed (for constipation).    [provider]  carvedilol (COREG) 3.125 MG tablet Take 1 tablet (3.125 mg total) by mouth 2 (two) times daily with a meal. Patient not taking: Reported on 04/09/2015 02/25/15 09/04/15  Bary Leriche, PA-C    Family History Family History  Problem Relation Age of Onset  . Heart disease Mother   . Diabetes Mother   . Hypertension Mother   . Heart attack Mother   . Diabetes Other   . Kidney disease Other   . Cardiomyopathy Unknown   . Diabetes Daughter   . Diabetes Son   . Hypertension Son     Social History Social History   Tobacco Use  . Smoking  status: Former Smoker    Last attempt to quit: 03/17/1983    Years since quitting: 34.3  . Smokeless tobacco: Never Used  Substance Use Topics  . Alcohol use: No  . Drug use: No     Allergies   Patient has no known allergies.   Review of Systems Review of Systems  Constitutional: Positive for appetite change.  HENT: Negative.   Respiratory: Negative.   Cardiovascular: Negative.   Gastrointestinal: Positive for abdominal pain, constipation, nausea and vomiting.  Endocrine: Negative.   Genitourinary: Negative.   Musculoskeletal: Negative.   Skin: Negative.   Allergic/Immunologic: Negative.   Neurological: Negative.   Hematological: Negative.   Psychiatric/Behavioral: Negative.   All other systems reviewed and are negative.    Physical Exam Updated Vital Signs BP (!) 172/79 (BP Location: Right Arm)   Pulse 85   Temp 99.2 F (37.3 C) (Oral)   Resp 18   SpO2 100%   Physical Exam  Constitutional: She is oriented to person, place, and time. She appears well-developed.  Chronically ill-appearing female actively vomiting  HENT:  Head: Normocephalic and atraumatic.  Mouth/Throat: Oropharynx is clear and moist.  Dentures in place  Eyes: EOM are normal.  Cardiovascular: Normal rate and regular rhythm.  Pulmonary/Chest: Effort normal.  Abdominal: Soft. Normal appearance and bowel sounds are normal. She exhibits no distension. There is no tenderness.  Musculoskeletal: Normal range of motion.  Neurological: She is alert and oriented to person, place, and time.  Patient with right-sided weakness from previous stroke at baseline  Skin: Skin is warm and dry. Capillary refill takes less than 2 seconds.  Psychiatric: She has a normal mood and affect.  Nursing note and vitals reviewed.    ED Treatments / Results  Labs (all labs ordered are listed, but only abnormal results are displayed) Labs Reviewed  LIPASE, BLOOD  COMPREHENSIVE METABOLIC PANEL  CBC  URINALYSIS,  ROUTINE W REFLEX MICROSCOPIC    EKG EKG Interpretation  Date/Time:  Thursday Jul 29 2017 16:57:20 EDT Ventricular Rate:  91 PR Interval:    QRS Duration: 110 QT Interval:  373 QTC Calculation: 459 R Axis:   31 Text Interpretation:  Sinus rhythm Atrial premature complex Incomplete left bundle branch block Borderline low voltage, extremity leads rate has increased from first prior with pvc nsst unchanged from prior  Confirmed by Pattricia Boss [phone]) on 07/29/2017 5:57:36 PM   Radiology Ct Abdomen Pelvis W Contrast  Result Date: 07/29/2017 CLINICAL DATA:  Periumbilical pain with nausea vomiting. Last bowel movement 5 days ago. EXAM: CT ABDOMEN AND PELVIS WITH CONTRAST TECHNIQUE: Multidetector CT imaging of the abdomen and pelvis was performed using the standard protocol following bolus administration of intravenous contrast. CONTRAST:  136m OMNIPAQUE IOHEXOL 300 MG/ML  SOLN COMPARISON:  06/18/2016 FINDINGS: Lower chest: Bilateral pleural thickening versus very small pleural effusions. Calcific atherosclerotic disease of the coronary arteries. Dilated distal esophagus. Hepatobiliary: Several too small to be accurately characterize hypoattenuated lesions throughout the liver. Normal appearance of the gallbladder. Pancreas: Unremarkable. No pancreatic ductal dilatation or surrounding inflammatory changes. Spleen: 13 mm circumscribed hypoattenuated lesion within the spleen, stable. Adrenals/Urinary Tract: Atrophic kidneys containing multiple hypoattenuated lesions. A punctate calcifications within bilateral kidneys. The bladder is decompressed and therefore suboptimally evaluated. Stomach/Bowel: Stomach is within normal limits. No evidence of bowel wall thickening, distention, or inflammatory changes. Moderate amount of formed stool throughout the colon, consistent with the given history of constipation. Vascular/Lymphatic: Aortic atherosclerosis. No enlarged abdominal or pelvic lymph nodes. Left  external iliac vein stenting. Left thigh AV graft with stable appearance. Reproductive: Leiomyomatous uterus.  No evidence of adnexal masses. Other: No abdominal wall hernia or abnormality. No abdominopelvic ascites. Musculoskeletal: Findings of renal osteodystrophy. Multilevel thoraco lumbar spine spondylosis. IMPRESSION: Mucosal thickening and dilation of the distal esophagus, partially visualized due to collimation. No evidence of acute findings within the abdomen or pelvis. Chronic findings of atrophic kidneys containing multiple hypoattenuated lesions and punctate calcifications. Moderate amount of formed stool throughout the colon consistent with the given history of constipation. Multiple too small to be actually characterized hypoattenuated lesions throughout the liver, stable in therefore likely benign. Calcific atherosclerotic disease of the aorta and coronary arteries. Electronically Signed   By: DFidela SalisburyM.D.   On: 07/29/2017 22:31   Dg Chest Port 1 View  Result Date: 07/29/2017 CLINICAL DATA:  Nausea and vomiting.  Hypertension. EXAM: PORTABLE CHEST 1 VIEW COMPARISON:  September 01, 2015 FINDINGS: There is no edema or consolidation. Heart is mildly enlarged with pulmonary vascularity within normal limits. No adenopathy. There is a linear metallic foreign body over the upper right hemithorax. No bone lesions. There is a stent in the left brachial and axillary regions. IMPRESSION: No edema or consolidation.  Stable cardiomegaly. Electronically Signed   By: WLowella GripIII M.D.   On: 07/29/2017 16:49    Procedures Procedures (including critical care time)  Medications Ordered in ED Medications - No data to display   Initial Impression / Assessment and Plan / ED Course  I have reviewed the triage vital signs and the nursing notes.  Pertinent labs & imaging results that were available during my care of the patient were reviewed by me and considered in my medical decision making  (see chart for details).     9:00 PM  Patient states she feels improved after Zofran.  CT pending.  Initial lactic acid elevated.  Plan recheck. Patient with temp of 100, nausea and vomiting on dialysis. Plan admission for further treatment pending ct results -blood cultures obtained - no antibiotics given -patient clinically improved -fluids infusing and repeat lactic pending Plan admission for observation  D/W Dr. GJonelle Sidleand he will see for admission Final Clinical Impressions(s) / ED Diagnoses   Final diagnoses:  Nausea and vomiting, intractability of vomiting not specified, unspecified vomiting type  ESRD (end stage  renal disease) [facility])    ED Discharge Orders    None       Pattricia Boss, MD 07/29/17 2251    Pattricia Boss, MD 07/29/17 [phone]

## 2017-07-29 NOTE — ED Notes (Signed)
Delay in lab draw,  Pt not in room 

## 2017-07-30 ENCOUNTER — Encounter (HOSPITAL_COMMUNITY): Payer: Self-pay | Admitting: General Practice

## 2017-07-30 ENCOUNTER — Other Ambulatory Visit: Payer: Self-pay

## 2017-07-30 DIAGNOSIS — R112 Nausea with vomiting, unspecified: Secondary | ICD-10-CM | POA: Diagnosis not present

## 2017-07-30 DIAGNOSIS — Z992 Dependence on renal dialysis: Secondary | ICD-10-CM | POA: Diagnosis not present

## 2017-07-30 DIAGNOSIS — I10 Essential (primary) hypertension: Secondary | ICD-10-CM | POA: Diagnosis not present

## 2017-07-30 DIAGNOSIS — E1140 Type 2 diabetes mellitus with diabetic neuropathy, unspecified: Secondary | ICD-10-CM | POA: Diagnosis not present

## 2017-07-30 DIAGNOSIS — N186 End stage renal disease: Secondary | ICD-10-CM | POA: Diagnosis not present

## 2017-07-30 LAB — COMPREHENSIVE METABOLIC PANEL
ALK PHOS: 84 U/L (ref 38–126)
ALT: 17 U/L (ref 14–54)
ANION GAP: 19 — AB (ref 5–15)
AST: 27 U/L (ref 15–41)
Albumin: 3.8 g/dL (ref 3.5–5.0)
BILIRUBIN TOTAL: 1.2 mg/dL (ref 0.3–1.2)
BUN: 15 mg/dL (ref 6–20)
CALCIUM: 9.7 mg/dL (ref 8.9–10.3)
CO2: 31 mmol/L (ref 22–32)
Chloride: 90 mmol/L — ABNORMAL LOW (ref 101–111)
Creatinine, Ser: 5.42 mg/dL — ABNORMAL HIGH (ref 0.44–1.00)
GFR calc Af Amer: 8 mL/min — ABNORMAL LOW (ref >60)
GFR, EST NON AFRICAN AMERICAN: 7 mL/min — AB (ref >60)
GLUCOSE: 313 mg/dL — AB (ref 65–99)
Potassium: 3.8 mmol/L (ref 3.5–5.1)
Sodium: 140 mmol/L (ref 135–145)
TOTAL PROTEIN: 7.2 g/dL (ref 6.5–8.1)

## 2017-07-30 LAB — CBC WITH DIFFERENTIAL/PLATELET
Abs Immature Granulocytes: 0 10*3/uL (ref 0.0–0.1)
BASOS ABS: 0 10*3/uL (ref 0.0–0.1)
BASOS PCT: 0 %
EOS PCT: 2 %
Eosinophils Absolute: 0.2 10*3/uL (ref 0.0–0.7)
HCT: 37 % (ref 36.0–46.0)
HEMOGLOBIN: 11.3 g/dL — AB (ref 12.0–15.0)
Immature Granulocytes: 0 %
LYMPHS PCT: 6 %
Lymphs Abs: 0.6 10*3/uL — ABNORMAL LOW (ref 0.7–4.0)
MCH: 31.9 pg (ref 26.0–34.0)
MCHC: 30.5 g/dL (ref 30.0–36.0)
MCV: 104.5 fL — ABNORMAL HIGH (ref 78.0–100.0)
Monocytes Absolute: 0.6 10*3/uL (ref 0.1–1.0)
Monocytes Relative: 6 %
NEUTROS ABS: 8.9 10*3/uL — AB (ref 1.7–7.7)
Neutrophils Relative %: 86 %
PLATELETS: 133 10*3/uL — AB (ref 150–400)
RBC: 3.54 MIL/uL — AB (ref 3.87–5.11)
RDW: 17.2 % — ABNORMAL HIGH (ref 11.5–15.5)
WBC: 10.3 10*3/uL (ref 4.0–10.5)

## 2017-07-30 LAB — GLUCOSE, CAPILLARY
GLUCOSE-CAPILLARY: 308 mg/dL — AB (ref 65–99)
GLUCOSE-CAPILLARY: 288 mg/dL — AB (ref 65–99)
GLUCOSE-CAPILLARY: 230 mg/dL — AB (ref 65–99)
GLUCOSE-CAPILLARY: 186 mg/dL — AB (ref 65–99)

## 2017-07-30 LAB — MRSA PCR SCREENING: MRSA by PCR: NEGATIVE

## 2017-07-30 MED ORDER — INSULIN ASPART 100 UNIT/ML ~~LOC~~ SOLN
0.0000 [IU] | Freq: Every day | SUBCUTANEOUS | Status: DC
  Administered 2017-08-02: 2 [IU] via SUBCUTANEOUS
  Administered 2017-08-03 – 2017-08-04 (×2): 4 [IU] via SUBCUTANEOUS

## 2017-07-30 MED ORDER — CALCIUM CARBONATE ANTACID 1250 MG/5ML PO SUSP: 500.0000 mg | Freq: Four times a day (QID) | ORAL | Status: DC | PRN

## 2017-07-30 MED ORDER — CALCITRIOL 0.5 MCG PO CAPS
1.7500 ug | ORAL_CAPSULE | ORAL | Status: DC
  Administered 2017-07-31: 1.75 ug via ORAL
  Filled 2017-07-30 (×2): qty 1

## 2017-07-30 MED ORDER — CALCIUM ACETATE (PHOS BINDER) 667 MG PO CAPS
2001.0000 mg | ORAL_CAPSULE | Freq: Three times a day (TID) | ORAL | Status: DC
  Administered 2017-07-30 – 2017-08-05 (×15): 2001 mg via ORAL
  Filled 2017-07-30 (×19): qty 3

## 2017-07-30 MED ORDER — SORBITOL 70 % PO SOLN: 30.0000 mL | Freq: Every day | ORAL | Status: DC | PRN

## 2017-07-30 MED ORDER — LIDOCAINE-PRILOCAINE 2.5-2.5 % EX CREA: 1.0000 "application " | TOPICAL_CREAM | CUTANEOUS | Status: DC

## 2017-07-30 MED ORDER — POLYETHYLENE GLYCOL 3350 17 G PO PACK
17.0000 g | PACK | Freq: Every day | ORAL | Status: DC | PRN
  Administered 2017-07-30: 17 g via ORAL
  Filled 2017-07-30 (×2): qty 1

## 2017-07-30 MED ORDER — PRAVASTATIN SODIUM 10 MG PO TABS
20.0000 mg | ORAL_TABLET | Freq: Every day | ORAL | Status: DC
  Administered 2017-07-30 – 2017-08-04 (×7): 20 mg via ORAL
  Filled 2017-07-30 (×7): qty 2

## 2017-07-30 MED ORDER — ONDANSETRON HCL 4 MG/2ML IJ SOLN
4.0000 mg | Freq: Four times a day (QID) | INTRAMUSCULAR | Status: DC | PRN
  Administered 2017-07-30 – 2017-08-03 (×2): 4 mg via INTRAVENOUS
  Filled 2017-07-30: qty 2

## 2017-07-30 MED ORDER — HEPARIN SODIUM (PORCINE) 5000 UNIT/ML IJ SOLN
5000.0000 [IU] | Freq: Three times a day (TID) | INTRAMUSCULAR | Status: DC
  Administered 2017-07-30 – 2017-08-05 (×16): 5000 [IU] via SUBCUTANEOUS
  Filled 2017-07-30 (×19): qty 1

## 2017-07-30 MED ORDER — CAMPHOR-MENTHOL 0.5-0.5 % EX LOTN: 1.0000 "application " | TOPICAL_LOTION | Freq: Three times a day (TID) | CUTANEOUS | Status: DC | PRN

## 2017-07-30 MED ORDER — ZOLPIDEM TARTRATE 5 MG PO TABS: 5.0000 mg | ORAL_TABLET | Freq: Every evening | ORAL | Status: DC | PRN

## 2017-07-30 MED ORDER — BACITRACIN-NEOMYCIN-POLYMYXIN 400-5-5000 EX OINT: 1.0000 "application " | TOPICAL_OINTMENT | CUTANEOUS | Status: DC | PRN

## 2017-07-30 MED ORDER — HYDROXYZINE HCL 25 MG PO TABS: 25.0000 mg | ORAL_TABLET | Freq: Three times a day (TID) | ORAL | Status: DC | PRN

## 2017-07-30 MED ORDER — TRAZODONE HCL 50 MG PO TABS: 25.0000 mg | ORAL_TABLET | Freq: Every evening | ORAL | Status: DC | PRN

## 2017-07-30 MED ORDER — LATANOPROST 0.005 % OP SOLN
1.0000 [drp] | Freq: Every day | OPHTHALMIC | Status: DC
  Administered 2017-07-30 – 2017-08-04 (×6): 1 [drp] via OPHTHALMIC
  Filled 2017-07-30: qty 2.5

## 2017-07-30 MED ORDER — CALCIUM ACETATE (PHOS BINDER) 667 MG PO CAPS: 667.0000 mg | ORAL_CAPSULE | ORAL | Status: DC | PRN

## 2017-07-30 MED ORDER — MELATONIN 3 MG PO TABS
4.5000 mg | ORAL_TABLET | Freq: Every day | ORAL | Status: DC
  Administered 2017-07-30 – 2017-08-04 (×7): 4.5 mg via ORAL
  Filled 2017-07-30 (×8): qty 1.5

## 2017-07-30 MED ORDER — LORATADINE 10 MG PO TABS
10.0000 mg | ORAL_TABLET | Freq: Every day | ORAL | Status: DC
  Administered 2017-07-30 – 2017-08-05 (×5): 10 mg via ORAL
  Filled 2017-07-30 (×5): qty 1

## 2017-07-30 MED ORDER — DICLOFENAC SODIUM 1 % TD GEL: 2.0000 g | Freq: Two times a day (BID) | TRANSDERMAL | Status: DC | PRN

## 2017-07-30 MED ORDER — INSULIN ASPART 100 UNIT/ML ~~LOC~~ SOLN: 0.0000 [IU] | Freq: Three times a day (TID) | SUBCUTANEOUS | Status: DC

## 2017-07-30 MED ORDER — GLIPIZIDE 5 MG PO TABS
10.0000 mg | ORAL_TABLET | Freq: Two times a day (BID) | ORAL | Status: DC
  Filled 2017-07-30: qty 2

## 2017-07-30 MED ORDER — BISACODYL 10 MG RE SUPP
10.0000 mg | Freq: Once | RECTAL | Status: AC
  Administered 2017-07-30: 10 mg via RECTAL
  Filled 2017-07-30: qty 1

## 2017-07-30 MED ORDER — SORBITOL 70 % SOLN: 30.0000 mL | Status: DC | PRN

## 2017-07-30 MED ORDER — INSULIN ASPART 100 UNIT/ML ~~LOC~~ SOLN
0.0000 [IU] | Freq: Three times a day (TID) | SUBCUTANEOUS | Status: DC
  Administered 2017-07-30: 7 [IU] via SUBCUTANEOUS
  Administered 2017-07-30: 5 [IU] via SUBCUTANEOUS
  Administered 2017-07-30: 3 [IU] via SUBCUTANEOUS
  Administered 2017-07-31 – 2017-08-02 (×2): 1 [IU] via SUBCUTANEOUS
  Administered 2017-08-02: 5 [IU] via SUBCUTANEOUS
  Administered 2017-08-02: 2 [IU] via SUBCUTANEOUS
  Administered 2017-08-03: 7 [IU] via SUBCUTANEOUS
  Administered 2017-08-03: 2 [IU] via SUBCUTANEOUS
  Administered 2017-08-04: 1 [IU] via SUBCUTANEOUS
  Administered 2017-08-04 – 2017-08-05 (×2): 7 [IU] via SUBCUTANEOUS

## 2017-07-30 MED ORDER — ACETAMINOPHEN 325 MG PO TABS
650.0000 mg | ORAL_TABLET | Freq: Two times a day (BID) | ORAL | Status: DC
  Administered 2017-07-30 – 2017-08-04 (×11): 650 mg via ORAL
  Filled 2017-07-30 (×11): qty 2

## 2017-07-30 MED ORDER — OXYCODONE HCL 5 MG PO TABS
5.0000 mg | ORAL_TABLET | Freq: Two times a day (BID) | ORAL | Status: DC
  Administered 2017-07-30 – 2017-08-05 (×13): 5 mg via ORAL
  Filled 2017-07-30 (×12): qty 1

## 2017-07-30 MED ORDER — SORBITOL 70 % SOLN
30.0000 mL | Freq: Every day | Status: DC
  Administered 2017-07-30 – 2017-08-04 (×3): 30 mL via ORAL
  Filled 2017-07-30 (×7): qty 30

## 2017-07-30 MED ORDER — SENNOSIDES-DOCUSATE SODIUM 8.6-50 MG PO TABS
1.0000 | ORAL_TABLET | Freq: Once | ORAL | Status: AC
  Administered 2017-07-30: 1 via ORAL
  Filled 2017-07-30: qty 1

## 2017-07-30 MED ORDER — ONDANSETRON HCL 4 MG PO TABS: 4.0000 mg | ORAL_TABLET | Freq: Four times a day (QID) | ORAL | Status: DC | PRN

## 2017-07-30 MED ORDER — RENA-VITE PO TABS
1.0000 | ORAL_TABLET | Freq: Every day | ORAL | Status: DC
  Administered 2017-07-30 – 2017-08-04 (×7): 1 via ORAL
  Filled 2017-07-30 (×8): qty 1

## 2017-07-30 MED ORDER — NEOMYCIN-POLYMYXIN-DEXAMETH 3.5-10000-0.1 OP SUSP
1.0000 [drp] | Freq: Two times a day (BID) | OPHTHALMIC | Status: DC
  Administered 2017-07-30 – 2017-08-05 (×12): 1 [drp] via OPHTHALMIC
  Filled 2017-07-30: qty 5

## 2017-07-30 MED ORDER — DOCUSATE SODIUM 283 MG RE ENEM: 1.0000 | ENEMA | RECTAL | Status: DC | PRN

## 2017-07-30 MED ORDER — ROPINIROLE HCL 1 MG PO TABS
1.0000 mg | ORAL_TABLET | Freq: Every day | ORAL | Status: DC
  Administered 2017-07-30 – 2017-08-04 (×7): 1 mg via ORAL
  Filled 2017-07-30 (×7): qty 1

## 2017-07-30 MED ORDER — NEPRO/CARBSTEADY PO LIQD: 237.0000 mL | Freq: Three times a day (TID) | ORAL | Status: DC | PRN

## 2017-07-30 MED ORDER — LISINOPRIL 5 MG PO TABS
5.0000 mg | ORAL_TABLET | Freq: Every morning | ORAL | Status: DC
  Administered 2017-07-30: 5 mg via ORAL
  Filled 2017-07-30: qty 1

## 2017-07-30 MED ORDER — SENNOSIDES-DOCUSATE SODIUM 8.6-50 MG PO TABS: 2.0000 | ORAL_TABLET | Freq: Every day | ORAL | Status: DC | PRN

## 2017-07-30 MED ORDER — ACETAMINOPHEN 650 MG RE SUPP: 650.0000 mg | Freq: Four times a day (QID) | RECTAL | Status: DC | PRN

## 2017-07-30 MED ORDER — ACETAMINOPHEN 325 MG PO TABS
650.0000 mg | ORAL_TABLET | Freq: Four times a day (QID) | ORAL | Status: DC | PRN
  Filled 2017-07-30: qty 2

## 2017-07-30 NOTE — Progress Notes (Signed)
Soaps suds enema completed on pt, she tolerated procedure well.  When inserting tube, could feel hard stool, and it was difficult to pass the tube around the stool.

## 2017-07-30 NOTE — Progress Notes (Signed)
Patient arrived to floor from ED. Stable, alert, and oriented. Report received from [place], [place].

## 2017-07-30 NOTE — Care Management Note (Signed)
Case Management Note  Patient Details  Name: [name] Marcott MRN: [id number] Date of Birth: 03-23-1945  Subjective/Objective:                    Action/Plan:  Patient from [facility] .   If home health needed [facility] preferred provider is [place] , but will offer choice. Expected Discharge Date:                  Expected Discharge Plan:  Assisted Living / Rest Home  In-House Referral:  Clinical Social Work  Discharge planning Services  CM Consult  Post Acute Care Choice:  Home Health Choice offered to:     DME Arranged:    DME Agency:     HH Arranged:    [place] Agency:     Status of Service:  In process, will continue to follow  If discussed at Long Length of Stay Meetings, dates discussed:    Additional Comments:  Marilu Favre, RN 07/30/2017, 12:16 PM

## 2017-07-30 NOTE — Social Work (Signed)
CSW aware pt is from [facility], will be able to discharge back there with [facility] per RN Case Manager. If pt remains observation no FL2 needed. If pt admitted to inpatient CSW will complete FL2 with recommended services. CSW will continue to follow.   [place], [place] Work [phone]

## 2017-07-30 NOTE — H&P (Signed)
History and Physical    Stefanie S Koogler GBT:[id number] DOB: June 19, 1945 DOA: 07/29/2017  Referring MD/NP/PA: Dr. Jeanell Sparrow  PCP: Raelyn Number, MD   Patient coming from: home  Chief Complaint: abdominal pain, nausea with vomiting  HPI: Stefanie [name] is a 72 y.o. female with medical history significant of end-stage renal disease on hemodialysis on Tuesdays, Thursdays and Saturdays who presented with 2 days history of nausea vomiting and epigastric pain. Symptoms have persisted despite patient's taking home medications. She went to hemodialysis today after was continued to have significant nausea vomiting and was unable to keep anything down so she came to the emergency room. Patient still complains of severe pain in the middle of her abdomen. Vomiting has stopped but she still has some nausea. She had low-grade temperature at home and also in the ER. No evidence of dysuria. No known GI symptoms. Patient is being admitted for symptomatic management of intractable nausea vomiting abdominal pain. She denied taking nonsteroidal anti-inflammatory agents. ED Course: patient has a temperature of 100. Blood pressure is 120/41. Respiratory 24. Lab work showed glucose of 175 creatinine 4.23 and lactic acid of 2.4. The globe is 11.7.  Review of Systems: As per HPI otherwise 10 point review of systems negative.   Past Medical History:  Diagnosis Date  . Anemia   . CAD (coronary artery disease)   . CHF (congestive heart failure) ([place])   . Constipation   . CVA (cerebral vascular accident) ([place]) [place]   left sided weakness  . Diabetes mellitus   . End stage renal disease ([place])    initiated HD 2006  . Hx of cardiovascular stress test    a. MV 03/2010:  no ischemia, EF 55%  . Hx of echocardiogram    a. Echo 03/2010: EF 55-60%, Gr 1 diast dysfn, trivial MS, mild to mod LAE, PASP 34, ;  b. Echo 11/13: mild LVH, EF 60%, Gr 1 diast dysfn, Ao sclerosis, no AS, MAC, slight MS, mean 3 mmHg, PASP 34  .  Hyperlipidemia   . Hypertension   . Protein malnutrition ([place])   . Renal insufficiency   . Retinal detachment, bilateral   . Secondary hyperparathyroidism ([place])     Past Surgical History:  Procedure Laterality Date  . ARTERIOVENOUS GRAFT PLACEMENT     BUE numerous times  . ARTERIOVENOUS GRAFT PLACEMENT  11/13/10   Lt femoral - Dr. Kellie Simmering  . CARDIAC CATHETERIZATION N/A 02/05/2015   Procedure: Left Heart Cath and Coronary Angiography;  Surgeon: Jettie Booze, MD;  Location: [place] [facility];  Service: Cardiovascular;  Laterality: N/A;  . CATARACT EXTRACTION    . DG AV DIALYSIS GRAFT DECLOT OR  06/06/11, 08/26/11   performed in IR  . PERIPHERAL VASCULAR CATHETERIZATION N/A 02/06/2015   Procedure: A/V Shuntogram;  Surgeon: Elam Dutch, MD;  Location: [place] [facility];  Service: Cardiovascular;  Laterality: N/A;  . PERIPHERAL VASCULAR CATHETERIZATION Left 02/26/2016   Procedure: A/V Shuntogram;  Surgeon: Waynetta Sandy, MD;  Location: [place] [facility];  Service: Cardiovascular;  Laterality: Left;  . PERIPHERAL VASCULAR CATHETERIZATION Left 02/26/2016   Procedure: Peripheral Vascular Balloon Angioplasty;  Surgeon: Waynetta Sandy, MD;  Location: [place] [facility];  Service: Cardiovascular;  Laterality: Left;  Lt thigh avf  . SHUNTOGRAM N/A 05/29/2013   Procedure: Earney Mallet;  Surgeon: Conrad Lead, MD;  Location: [facility] CATH LAB;  Service: Cardiovascular;  Laterality: N/A;  . SHUNTOGRAM Left 03/01/2014   Procedure: SHUNTOGRAM;  Surgeon: Conrad [place], MD;  Location: [facility] CATH LAB;  Service: Cardiovascular;  Laterality: Left;  . TUBAL LIGATION       reports that she quit smoking about 34 years ago. She has never used smokeless tobacco. She reports that she does not drink alcohol or use drugs.  No Known Allergies  Family History  Problem Relation Age of Onset  . Heart disease Mother   . Diabetes Mother   . Hypertension Mother   . Heart attack Mother   .  Diabetes Other   . Kidney disease Other   . Cardiomyopathy Unknown   . Diabetes Daughter   . Diabetes Son   . Hypertension Son     Prior to Admission medications   Medication Sig Start Date End Date Taking? Authorizing Provider  acetaminophen (TYLENOL) 325 MG tablet Take 650 mg by mouth 2 (two) times daily.   Yes [provider]  b complex-vitamin c-folic acid (NEPHRO-VITE) 0.8 MG TABS tablet Take 1 tablet by mouth See admin instructions. Every Monday, Wednesday and Friday   Yes [provider]  calcium acetate (PHOSLO) 667 MG capsule Take 667-2,001 mg by mouth See admin instructions. Take 3 capsules three times a day with meals and take 1 capsule twice daily with snacks   Yes [provider]  diclofenac sodium (VOLTAREN) 1 % GEL Apply 2 g topically See admin instructions. Apply to both knees daily as needed for pain and to right hand 2-3 times a day as needed for pain (BRAND NAME VOLTAREN)   Yes [provider]  glipiZIDE (GLUCOTROL) 10 MG tablet Take 10 mg by mouth 2 (two) times daily before a meal.    Yes [provider]  latanoprost (XALATAN) 0.005 % ophthalmic solution Place 1 drop into the left eye at bedtime.   Yes [provider]  lidocaine-prilocaine (EMLA) cream Apply 1 application topically as needed (topical anesthesia for hemodialysis if Gebauers and Lidocaine injection are ineffective.). Patient taking differently: Apply 1 application topically 3 (three) times a week. Tuesday, Thursday and saturday 02/25/15  Yes Love, Ivan Anchors, PA-C  lisinopril (PRINIVIL,ZESTRIL) 5 MG tablet Take 5 mg by mouth every morning.    Yes [provider]  loratadine (CLARITIN) 10 MG tablet Take 10 mg by mouth daily.   Yes [provider]  Melatonin 5 MG TABS Take 5 mg by mouth at bedtime.   Yes [provider]  neomycin-bacitracin-polymyxin (NEOSPORIN) ointment Apply 1 application topically as needed for wound care.   Yes  [provider]  neomycin-polymyxin b-dexamethasone (MAXITROL) 3.5-10000-0.1 SUSP Place 1 drop into the right eye 2 (two) times daily. 07/12/17  Yes [provider]  oxyCODONE (OXY IR/ROXICODONE) 5 MG immediate release tablet Take 5 mg by mouth 2 (two) times daily.    Yes [provider]  polyethylene glycol (MIRALAX) packet Use 17 gms three times daily until bowels move, with maximum of 3 consecutive days Patient taking differently: Take 17 g by mouth See admin instructions. On Tuesday, Thursday and Saturday as needed 09/02/15  Yes Upstill, Nehemiah Settle, PA-C  pravastatin (PRAVACHOL) 20 MG tablet Take 20 mg by mouth at bedtime.   Yes [provider]  rOPINIRole (REQUIP) 1 MG tablet Take 1 mg by mouth at bedtime.    Yes [provider]  sennosides-docusate sodium (SENOKOT-S) 8.6-50 MG tablet Take 2 tablets by mouth daily as needed for constipation.    Yes [provider]  sorbitol 70 % solution Take 30 mLs by  mouth daily as needed (for constipation).   Yes [provider]  traZODone (DESYREL) 50 MG tablet Take 25 mg by mouth as needed.  07/06/17  Yes [provider]  carvedilol (COREG) 3.125 MG tablet Take 1 tablet (3.125 mg total) by mouth 2 (two) times daily with a meal. Patient not taking: Reported on 04/09/2015 02/25/15 09/04/15  Bary Leriche, PA-C    Physical Exam: Vitals:   07/29/17 2005 07/29/17 2100 07/29/17 2330 07/30/17 0007  BP: (!) 148/52 (!) 156/55 (!) 137/45 (!) 120/41  Pulse: 91 90 88 85  Resp: (!) 28 (!) 27 19 (!) 24  Temp:      TempSrc:      SpO2: 95% 98% 100% 98%      Constitutional: NAD, calm, comfortable Vitals:   07/29/17 2005 07/29/17 2100 07/29/17 2330 07/30/17 0007  BP: (!) 148/52 (!) 156/55 (!) 137/45 (!) 120/41  Pulse: 91 90 88 85  Resp: (!) 28 (!) 27 19 (!) 24  Temp:      TempSrc:      SpO2: 95% 98% 100% 98%   Eyes: PERRL, lids and conjunctivae normal ENMT: Mucous membranes are moist.  Posterior pharynx clear of any exudate or lesions.Normal dentition.  Neck: normal, supple, no masses, no thyromegaly Respiratory: clear to auscultation bilaterally, no wheezing, no crackles. Normal respiratory effort. No accessory muscle use.  Cardiovascular: Regular rate and rhythm, no murmurs / rubs / gallops. No extremity edema. 2+ pedal pulses. No carotid bruits.  Abdomen: mild epigastric tenderness, no masses palpated. No hepatosplenomegaly. Bowel sounds positive.  Musculoskeletal: no clubbing / cyanosis. No joint deformity upper and lower extremities. Good ROM, no contractures. Normal muscle tone.  Skin: no rashes, lesions, ulcers. No induration Neurologic: CN 2-12 grossly intact. Sensation intact, DTR normal. Strength 5/5 in all 4.  Psychiatric: Normal judgment and insight. Alert and oriented x 3. Normal mood.   Labs on Admission: I have personally reviewed following labs and imaging studies  CBC: Recent Labs  Lab 07/29/17 1708  WBC 6.3  HGB 11.7*  HCT 38.1  MCV 101.6*  PLT 656*   Basic Metabolic Panel: Recent Labs  Lab 07/29/17 1708  NA 140  K 3.7  CL 87*  CO2 35*  GLUCOSE 175*  BUN 5*  CREATININE 4.23*  CALCIUM 10.8*   GFR: CrCl cannot be calculated (Unknown ideal weight.). Liver Function Tests: Recent Labs  Lab 07/29/17 1708  AST 38  ALT 20  ALKPHOS 98  BILITOT 0.9  PROT 8.4*  ALBUMIN 4.5   Recent Labs  Lab 07/29/17 1708  LIPASE 26   No results for input(s): AMMONIA in the last 168 hours. Coagulation Profile: No results for input(s): INR, PROTIME in the last 168 hours. Cardiac Enzymes: No results for input(s): CKTOTAL, CKMB, CKMBINDEX, TROPONINI in the last 168 hours. BNP (last 3 results) No results for input(s): PROBNP in the last 8760 hours. HbA1C: No results for input(s): HGBA1C in the last 72 hours. CBG: No results for input(s): GLUCAP in the last 168 hours. Lipid Profile: No results for input(s): CHOL, HDL, LDLCALC, TRIG, CHOLHDL,  LDLDIRECT in the last 72 hours. Thyroid Function Tests: No results for input(s): TSH, T4TOTAL, FREET4, T3FREE, THYROIDAB in the last 72 hours. Anemia Panel: No results for input(s): VITAMINB12, FOLATE, FERRITIN, TIBC, IRON, RETICCTPCT in the last 72 hours. Urine analysis: No results found for: COLORURINE, APPEARANCEUR, LABSPEC, PHURINE, GLUCOSEU, HGBUR, BILIRUBINUR, KETONESUR, PROTEINUR, UROBILINOGEN, NITRITE, LEUKOCYTESUR Sepsis Labs: @LABRCNTIP (procalcitonin:4,lacticidven:4) )No results found for this or any  previous visit (from the past 240 hour(s)).   Radiological Exams on Admission: Ct Abdomen Pelvis W Contrast  Result Date: 07/29/2017 CLINICAL DATA:  Periumbilical pain with nausea vomiting. Last bowel movement 5 days ago. EXAM: CT ABDOMEN AND PELVIS WITH CONTRAST TECHNIQUE: Multidetector CT imaging of the abdomen and pelvis was performed using the standard protocol following bolus administration of intravenous contrast. CONTRAST:  180m OMNIPAQUE IOHEXOL 300 MG/ML  SOLN COMPARISON:  06/18/2016 FINDINGS: Lower chest: Bilateral pleural thickening versus very small pleural effusions. Calcific atherosclerotic disease of the coronary arteries. Dilated distal esophagus. Hepatobiliary: Several too small to be accurately characterize hypoattenuated lesions throughout the liver. Normal appearance of the gallbladder. Pancreas: Unremarkable. No pancreatic ductal dilatation or surrounding inflammatory changes. Spleen: 13 mm circumscribed hypoattenuated lesion within the spleen, stable. Adrenals/Urinary Tract: Atrophic kidneys containing multiple hypoattenuated lesions. A punctate calcifications within bilateral kidneys. The bladder is decompressed and therefore suboptimally evaluated. Stomach/Bowel: Stomach is within normal limits. No evidence of bowel wall thickening, distention, or inflammatory changes. Moderate amount of formed stool throughout the colon, consistent with the given history of constipation.  Vascular/Lymphatic: Aortic atherosclerosis. No enlarged abdominal or pelvic lymph nodes. Left external iliac vein stenting. Left thigh AV graft with stable appearance. Reproductive: Leiomyomatous uterus.  No evidence of adnexal masses. Other: No abdominal wall hernia or abnormality. No abdominopelvic ascites. Musculoskeletal: Findings of renal osteodystrophy. Multilevel thoraco lumbar spine spondylosis. IMPRESSION: Mucosal thickening and dilation of the distal esophagus, partially visualized due to collimation. No evidence of acute findings within the abdomen or pelvis. Chronic findings of atrophic kidneys containing multiple hypoattenuated lesions and punctate calcifications. Moderate amount of formed stool throughout the colon consistent with the given history of constipation. Multiple too small to be actually characterized hypoattenuated lesions throughout the liver, stable in therefore likely benign. Calcific atherosclerotic disease of the aorta and coronary arteries. Electronically Signed   By: DFidela SalisburyM.D.   On: 07/29/2017 22:31   Dg Chest Port 1 View  Result Date: 07/29/2017 CLINICAL DATA:  Nausea and vomiting.  Hypertension. EXAM: PORTABLE CHEST 1 VIEW COMPARISON:  September 01, 2015 FINDINGS: There is no edema or consolidation. Heart is mildly enlarged with pulmonary vascularity within normal limits. No adenopathy. There is a linear metallic foreign body over the upper right hemithorax. No bone lesions. There is a stent in the left brachial and axillary regions. IMPRESSION: No edema or consolidation.  Stable cardiomegaly. Electronically Signed   By: WLowella GripIII M.D.   On: 07/29/2017 16:49      Assessment/Plan Principal Problem:   Nausea & vomiting Active Problems:   Essential hypertension   [place] on [place]   Type 2 diabetes, controlled, with neuropathy (HCC)   Congestive dilated cardiomyopathy (HCC)   Weakness generalized    #1 intractable nausea with vomiting:  Cause most likely multifactorial. It could be secondary to acute viral illness, diabetic gastroparesis, medications as well as complications of end stage renal disease. Patient will be admitted for symptomatically management. Clear liquid diet and advance as tolerated. Give essential medications by IV route if not tolerated by mouth. Lactic acidosis most likely due to dehydration.  #2 end-stage renal disease: On hemodialysis Tuesdays Thursdays and Saturdays. She had had dialysis today. Get nephrology consult in the morning.  #3 essential hypertension: Blood pressure appears reasonably controlled. No change in current regimen.  #4 diabetes type 2: And all hypoglycemics. We'll switch to sliding scale insulin and monitor.  #5 generalized weakness: Most likely secondary to nausea with vomiting.  Hydrate patient and consider PT evaluation.   DVT prophylaxis: heparin  Code Status: full code  Family Communication: no family available  Disposition Plan: home  Consults called: nephrology to be called in the morning  Admission status: observation   Severity of Illness: The appropriate patient status for this patient is OBSERVATION. Observation status is judged to be reasonable and necessary in order to provide the required intensity of service to ensure the patient's safety. The patient's presenting symptoms, physical exam findings, and initial radiographic and laboratory data in the context of their medical condition is felt to place them at decreased risk for further clinical deterioration. Furthermore, it is anticipated that the patient will be medically stable for discharge from the hospital within 2 midnights of admission. The following factors support the patient status of observation.   " The patient's presenting symptoms include nausea with vomiting. " The physical exam findings include epigastric tenderness. " The initial radiographic and laboratory data are lactic acidosis.     Barbette Merino  MD [facility] Pager [phone]  If 7PM-7AM, please contact night-coverage [email or web address] Password [facility]  07/30/2017, 12:37 AM

## 2017-07-30 NOTE — ED Notes (Signed)
Spoke with Dr. Jonelle Sidle about admission orders

## 2017-07-30 NOTE — Progress Notes (Signed)
Pt with ESRD on TTS at [facility] admitted to OBS bed with N, V, abdominal pain in the setting of constipation.  HD orders TTS [facility] 4 hr 2K 2 Ca 450/800 EDW 74.5  heparin 3000 sensipar 180 with HD, Micera 150 q 2 weeks 5/9 calcitriol 1.75  Runs full treatments - gets to edw most of the time gains 1-3 L-  Recent labs; Hgb 9.5 stable iPTH/Ca/P in goal.  HD orders written for Saturday.  Full consult if admitted to inpatient status. Hold sensipar for now given N and V.   Amalia Hailey, PA-C

## 2017-07-30 NOTE — Progress Notes (Signed)
PROGRESS NOTE  Subjective: Stefanie [name] is a 72 y.o. female with a history of ESRD, T2DM, HTN who presented to the ED with intractable nausea and vomiting. Work up reassuring, though symptoms continued and pt unable to tolerate po so was brought in for observation this morning.  Seen this morning, reporting abdominal cramping and constipation, but no vomiting. Tried to eat breakfast and lunch. On reevaluation this afternoon she reports that she ate some breakfast but had "a little bit" of vomiting and has not eaten any of lunch plate. No BM following senna po and dulcolax suppository.  Objective: BP (!) 156/72 (BP Location: Right Arm)   Pulse 76   Temp (!) 97.3 F (36.3 C) (Oral)   Resp 20   Wt 74.9 kg (165 lb 2 oz)   SpO2 100%   BMI 28.34 kg/m   Gen: Chronically ill-appearing female in apparent discomfort Pulm: Clear and nonlabored on room air  CV: RRR, no murmur, no JVD, no edema GI: Soft, mildly diffusely tender without distention or rebound.  Assessment & Plan: Abdominal pain, intractable nausea and vomiting in setting of constipation: - Will continue antiemetics (zofran). May consider gastric emptying study and trial reglan if remains symptomatic with resolution of constipation. - Continue home oxycodone to avoid withdrawal. - Continue senna 2 tabs and miralax once daily - Sorbitol was ordered prn but has not been given, so will schedule this daily - Given dulcolax suppository without BM. Will give soap suds enema today - If symptoms continue, may need GI evaluation of dilated esophagus.   Lactic acid elevation: Without acidosis. Uncertain clinical significance with ESRD and no true fever (Tmax 100.53F) or leukocytosis.  - Monitor blood cultures and fever curve off abx   ESRD:  - Continue home medications. Was hoping to discharge today and have outpatient HD as usual but I've alerted nephrology to the patient's admission as she will need HD on schedule 5/18.  T2DM with  hyperglycemia:  - Last HbA1c 7.5% remotely. Will recheck.  - Cover with renal SSI qAC and HS.  CAD and chronic combined CHF: No anginal complaints. - Appears compensated, given 1L NS in ED. No indication to repeat echo. - Volume management with HD UF.  - Not on ASA for unclear reasons, possibly due to bleeding complications in the past. Will need cardiology follow up.  - Continue statin.   HTN:  - Continue home lisinopril  Patrecia Pour, MD [facility] 07/30/2017, 2:46 PM

## 2017-07-31 DIAGNOSIS — I10 Essential (primary) hypertension: Secondary | ICD-10-CM | POA: Diagnosis not present

## 2017-07-31 DIAGNOSIS — J690 Pneumonitis due to inhalation of food and vomit: Secondary | ICD-10-CM | POA: Diagnosis not present

## 2017-07-31 DIAGNOSIS — I2510 Atherosclerotic heart disease of native coronary artery without angina pectoris: Secondary | ICD-10-CM | POA: Diagnosis present

## 2017-07-31 DIAGNOSIS — E1140 Type 2 diabetes mellitus with diabetic neuropathy, unspecified: Secondary | ICD-10-CM

## 2017-07-31 DIAGNOSIS — R531 Weakness: Secondary | ICD-10-CM | POA: Diagnosis not present

## 2017-07-31 DIAGNOSIS — I420 Dilated cardiomyopathy: Secondary | ICD-10-CM | POA: Diagnosis present

## 2017-07-31 DIAGNOSIS — Z993 Dependence on wheelchair: Secondary | ICD-10-CM | POA: Diagnosis not present

## 2017-07-31 DIAGNOSIS — E785 Hyperlipidemia, unspecified: Secondary | ICD-10-CM | POA: Diagnosis present

## 2017-07-31 DIAGNOSIS — K449 Diaphragmatic hernia without obstruction or gangrene: Secondary | ICD-10-CM | POA: Diagnosis present

## 2017-07-31 DIAGNOSIS — I132 Hypertensive heart and chronic kidney disease with heart failure and with stage 5 chronic kidney disease, or end stage renal disease: Secondary | ICD-10-CM | POA: Diagnosis present

## 2017-07-31 DIAGNOSIS — K5900 Constipation, unspecified: Secondary | ICD-10-CM | POA: Diagnosis present

## 2017-07-31 DIAGNOSIS — E860 Dehydration: Secondary | ICD-10-CM | POA: Diagnosis present

## 2017-07-31 DIAGNOSIS — D649 Anemia, unspecified: Secondary | ICD-10-CM | POA: Diagnosis present

## 2017-07-31 DIAGNOSIS — Z8673 Personal history of transient ischemic attack (TIA), and cerebral infarction without residual deficits: Secondary | ICD-10-CM | POA: Diagnosis not present

## 2017-07-31 DIAGNOSIS — E872 Acidosis: Secondary | ICD-10-CM | POA: Diagnosis present

## 2017-07-31 DIAGNOSIS — I5042 Chronic combined systolic (congestive) and diastolic (congestive) heart failure: Secondary | ICD-10-CM | POA: Diagnosis present

## 2017-07-31 DIAGNOSIS — Z992 Dependence on renal dialysis: Secondary | ICD-10-CM | POA: Diagnosis not present

## 2017-07-31 DIAGNOSIS — N186 End stage renal disease: Secondary | ICD-10-CM | POA: Diagnosis present

## 2017-07-31 DIAGNOSIS — E1165 Type 2 diabetes mellitus with hyperglycemia: Secondary | ICD-10-CM | POA: Diagnosis present

## 2017-07-31 DIAGNOSIS — E1151 Type 2 diabetes mellitus with diabetic peripheral angiopathy without gangrene: Secondary | ICD-10-CM | POA: Diagnosis present

## 2017-07-31 DIAGNOSIS — R112 Nausea with vomiting, unspecified: Secondary | ICD-10-CM | POA: Diagnosis present

## 2017-07-31 DIAGNOSIS — Z87891 Personal history of nicotine dependence: Secondary | ICD-10-CM | POA: Diagnosis not present

## 2017-07-31 DIAGNOSIS — Z8249 Family history of ischemic heart disease and other diseases of the circulatory system: Secondary | ICD-10-CM | POA: Diagnosis not present

## 2017-07-31 DIAGNOSIS — K228 Other specified diseases of esophagus: Secondary | ICD-10-CM | POA: Diagnosis present

## 2017-07-31 DIAGNOSIS — Z833 Family history of diabetes mellitus: Secondary | ICD-10-CM | POA: Diagnosis not present

## 2017-07-31 DIAGNOSIS — N2581 Secondary hyperparathyroidism of renal origin: Secondary | ICD-10-CM | POA: Diagnosis present

## 2017-07-31 DIAGNOSIS — K210 Gastro-esophageal reflux disease with esophagitis: Secondary | ICD-10-CM | POA: Diagnosis present

## 2017-07-31 DIAGNOSIS — E1122 Type 2 diabetes mellitus with diabetic chronic kidney disease: Secondary | ICD-10-CM | POA: Diagnosis present

## 2017-07-31 LAB — GLUCOSE, CAPILLARY
GLUCOSE-CAPILLARY: 123 mg/dL — AB (ref 65–99)
Glucose-Capillary: 104 mg/dL — ABNORMAL HIGH (ref 65–99)
Glucose-Capillary: 111 mg/dL — ABNORMAL HIGH (ref 65–99)
Glucose-Capillary: 92 mg/dL (ref 65–99)

## 2017-07-31 MED ORDER — SODIUM CHLORIDE 0.9 % IV BOLUS
250.0000 mL | Freq: Once | INTRAVENOUS | Status: AC | PRN
  Administered 2017-07-31: 250 mL via INTRAVENOUS

## 2017-07-31 MED ORDER — POLYETHYLENE GLYCOL 3350 17 G PO PACK
17.0000 g | PACK | Freq: Two times a day (BID) | ORAL | Status: DC
  Administered 2017-07-31 – 2017-08-04 (×6): 17 g via ORAL
  Filled 2017-07-31 (×8): qty 1

## 2017-07-31 MED ORDER — SENNOSIDES-DOCUSATE SODIUM 8.6-50 MG PO TABS
2.0000 | ORAL_TABLET | Freq: Two times a day (BID) | ORAL | Status: DC
  Administered 2017-07-31 (×2): 2 via ORAL
  Filled 2017-07-31 (×2): qty 2

## 2017-07-31 MED ORDER — METOCLOPRAMIDE HCL 5 MG PO TABS
5.0000 mg | ORAL_TABLET | Freq: Three times a day (TID) | ORAL | Status: DC
  Administered 2017-07-31 – 2017-08-05 (×13): 5 mg via ORAL
  Filled 2017-07-31 (×13): qty 1

## 2017-07-31 MED ORDER — SODIUM CHLORIDE 0.9 % IV BOLUS
250.0000 mL | Freq: Once | INTRAVENOUS | Status: AC
  Administered 2017-07-31: 250 mL via INTRAVENOUS

## 2017-07-31 NOTE — Progress Notes (Signed)
Blood pressure 88/44 taken manually after administering 250 ml bolus fluids. Started new order bolus fluids will reassess in an hour when infusion is complete

## 2017-07-31 NOTE — Progress Notes (Addendum)
PROGRESS NOTE  Subjective: Stefanie [name] is a 72 y.o. female with a history of ESRD, T2DM, HTN who presented to the ED with intractable nausea and vomiting. Work up reassuring, though symptoms continued and pt unable to tolerate po so was brought in for observation. She reports her last BM was 5/13 and stool burden was noted on CT abdomen/pelvis. Bowel regimen was started including enema without BM. Intractable nausea and vomiting has continued, no po intake.   This morning she reports she's been drinking some clears but unable to keep any solids down. Lower midline abdomen is cramping moderately, constantly. No BM yesterday.   Objective: BP 140/63 (BP Location: Left Arm)   Pulse 82   Temp 98.9 F (37.2 C) (Oral)   Resp 18   Wt 74.9 kg (165 lb 2 oz)   SpO2 92%   BMI 28.34 kg/m   Gen: Chronically ill-appearing uncomfortable female in no distress HEENT: MMM, poor dentition Pulm: Clear and nonlabored on room air  CV: RRR, no murmur, no JVD, no edema GI: Soft, mildly diffusely tender mostly in lower midline without distention or rebound. Skin: No ulcers Neuro: Alert, oriented, no focal deficits Psych: Euthymic mood, congruent affect, appropriate behavior.  Assessment & Plan: Abdominal pain, intractable nausea and vomiting in setting of constipation: - Aggressively treating constipation w/scheduled stimulant and osmotic laxatives, suppositories and enemas. - Will start reglan. May consider gastric emptying study if remains symptomatic with resolution of constipation. - Urged to get OOB, PT eval. - Continue home oxycodone to avoid withdrawal. - If symptoms continue, may need GI evaluation of dilated esophagus.   Lactic acid elevation: Without acidosis. Uncertain clinical significance with ESRD and no true fever (Tmax 100.68F) or leukocytosis.  - Monitor blood cultures and fever curve off abx   ESRD:  - Continue home medications. - HD on schedule today per nephrology.   T2DM with  hyperglycemia:  - Last HbA1c 7.5% remotely. Will recheck in AM - Cover with renal SSI qAC and HS. Has not required insulin due to not taking anything by mouth.  CAD and chronic combined CHF: No anginal complaints. - Appears compensated, given 1L NS in ED. No indication to repeat echo. - Volume management with HD UF.  - Not on ASA for unclear reasons, possibly due to bleeding complications in the past. Will need cardiology follow up.  - Continue statin.   HTN:  - Continue home lisinopril  Patrecia Pour, MD [facility] 07/31/2017, 12:44 PM

## 2017-07-31 NOTE — Progress Notes (Signed)
Blood pressure 78/39 HR 52 at 2147. Paged the Dr on call

## 2017-08-01 ENCOUNTER — Inpatient Hospital Stay (HOSPITAL_COMMUNITY): Payer: Medicare Other

## 2017-08-01 DIAGNOSIS — R531 Weakness: Secondary | ICD-10-CM

## 2017-08-01 DIAGNOSIS — J690 Pneumonitis due to inhalation of food and vomit: Secondary | ICD-10-CM

## 2017-08-01 LAB — GLUCOSE, CAPILLARY
GLUCOSE-CAPILLARY: 59 mg/dL — AB (ref 65–99)
GLUCOSE-CAPILLARY: 119 mg/dL — AB (ref 65–99)
Glucose-Capillary: 46 mg/dL — ABNORMAL LOW (ref 65–99)
Glucose-Capillary: 73 mg/dL (ref 65–99)
Glucose-Capillary: 127 mg/dL — ABNORMAL HIGH (ref 65–99)

## 2017-08-01 LAB — CBC
HCT: 32.2 % — ABNORMAL LOW (ref 36.0–46.0)
Hemoglobin: 10.1 g/dL — ABNORMAL LOW (ref 12.0–15.0)
MCH: 31.6 pg (ref 26.0–34.0)
MCHC: 31.4 g/dL (ref 30.0–36.0)
MCV: 100.6 fL — ABNORMAL HIGH (ref 78.0–100.0)
Platelets: 135 10*3/uL — ABNORMAL LOW (ref 150–400)
RBC: 3.2 MIL/uL — ABNORMAL LOW (ref 3.87–5.11)
RDW: 17.5 % — AB (ref 11.5–15.5)
WBC: 11.4 10*3/uL — ABNORMAL HIGH (ref 4.0–10.5)

## 2017-08-01 LAB — HEMOGLOBIN A1C
Hgb A1c MFr Bld: 7.7 % — ABNORMAL HIGH (ref 4.8–5.6)
MEAN PLASMA GLUCOSE: 174.29 mg/dL

## 2017-08-01 LAB — RENAL FUNCTION PANEL
ANION GAP: 17 — AB (ref 5–15)
Albumin: 3 g/dL — ABNORMAL LOW (ref 3.5–5.0)
BUN: 43 mg/dL — ABNORMAL HIGH (ref 6–20)
CALCIUM: 9.6 mg/dL (ref 8.9–10.3)
CO2: 30 mmol/L (ref 22–32)
Chloride: 89 mmol/L — ABNORMAL LOW (ref 101–111)
Creatinine, Ser: 8.59 mg/dL — ABNORMAL HIGH (ref 0.44–1.00)
GFR calc non Af Amer: 4 mL/min — ABNORMAL LOW (ref >60)
GFR, EST AFRICAN AMERICAN: 5 mL/min — AB (ref >60)
GLUCOSE: 60 mg/dL — AB (ref 65–99)
PHOSPHORUS: 4 mg/dL (ref 2.5–4.6)
POTASSIUM: 3.7 mmol/L (ref 3.5–5.1)
Sodium: 136 mmol/L (ref 135–145)

## 2017-08-01 MED ORDER — CINACALCET HCL 30 MG PO TABS
30.0000 mg | ORAL_TABLET | ORAL | Status: DC
  Administered 2017-08-05: 30 mg via ORAL
  Filled 2017-08-01 (×2): qty 1

## 2017-08-01 MED ORDER — SODIUM CHLORIDE 0.9 % IV SOLN
3.0000 g | Freq: Two times a day (BID) | INTRAVENOUS | Status: DC
  Administered 2017-08-02 – 2017-08-05 (×8): 3 g via INTRAVENOUS
  Filled 2017-08-01 (×10): qty 3

## 2017-08-01 MED ORDER — DARBEPOETIN ALFA 100 MCG/0.5ML IJ SOSY
100.0000 ug | PREFILLED_SYRINGE | INTRAMUSCULAR | Status: DC
  Filled 2017-08-01: qty 0.5

## 2017-08-01 MED ORDER — GLUCOSE 4 G PO CHEW
CHEWABLE_TABLET | ORAL | Status: AC
  Administered 2017-08-01: 1
  Filled 2017-08-01: qty 1

## 2017-08-01 MED ORDER — SENNOSIDES-DOCUSATE SODIUM 8.6-50 MG PO TABS
2.0000 | ORAL_TABLET | Freq: Every day | ORAL | Status: DC
  Administered 2017-08-01 – 2017-08-03 (×2): 2 via ORAL
  Filled 2017-08-01 (×4): qty 2

## 2017-08-01 MED ORDER — NEPRO/CARBSTEADY PO LIQD
237.0000 mL | Freq: Three times a day (TID) | ORAL | Status: DC
  Administered 2017-08-02 – 2017-08-05 (×7): 237 mL via ORAL
  Filled 2017-08-01 (×15): qty 237

## 2017-08-01 MED ORDER — DEXTROSE 50 % IV SOLN
INTRAVENOUS | Status: AC
  Filled 2017-08-01: qty 50

## 2017-08-01 NOTE — [place] FL2 (Signed)
[place] MEDICAID FL2 LEVEL OF CARE SCREENING TOOL     IDENTIFICATION  Patient Name: Stefanie [name] Birthdate: [birth year] Sex: female Admission Date (Current Location): 07/29/2017  [facility] and [place] Number:  Herbalist and Address:  [facility]. [facility], [place] [address], [place],  [address]      Provider Number: [id number]  Attending Physician Name and Address:  Patrecia Pour, MD  Relative Name and Phone Number:   Aliene Altes [phone] [place] [phone]    Current Level of Care: Hospital Recommended Level of Care: [place] [facility] with [facility]) Prior Approval Number:    Date Approved/Denied:   PASRR Number:    Discharge Plan: Other (Comment)([facility])    Current Diagnoses: Patient Active Problem List   Diagnosis Date Noted  . Nausea & vomiting 07/30/2017  . Acute blood loss anemia 02/09/2016  . Hypotension due to blood loss 02/09/2016  . Chronic combined systolic and diastolic CHF (congestive heart failure) ([place]) 02/09/2016  . Hemorrhage 02/08/2016  . Anemia 02/08/2016  . Left leg pain 04/10/2015  . Bacteremia 04/10/2015  . Lactic acidosis   . Cellulitis 04/09/2015  . History of CVA with residual deficit 02/11/2015  . Weakness generalized 02/08/2015  . DVT (deep venous thrombosis) ([place])   . CHF (congestive heart failure) ([place])   . Chronic diastolic congestive heart failure ([place])   . ESRD (end stage renal disease) ([place])   . Congestive dilated cardiomyopathy ([place])   . Left leg DVT ([place]) 02/01/2015  . Leukocytosis 02/01/2015  . Elevated troponin 02/01/2015  . SOB (shortness of breath) 02/01/2015  . Type 2 diabetes, controlled, with neuropathy ([place]) 12/20/2014  . Pain due to onychomycosis of toenail 12/20/2014  . 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 Mechanical complication of other vascular device, implant, and graft 05/19/2013  . PAD (peripheral artery disease) ([place])  02/24/2012  . Murmur 12/24/2011  . Bradycardia-intermittent sinus 07/10/2010  . DYSPNEA ON EXERTION 02/25/2010  . CHEST PAIN, EXERTIONAL 02/25/2010  . DM 02/24/2010  . PROTEIN MALNUTRITION 02/24/2010  . HLD (hyperlipidemia) 02/24/2010  . ANEMIA 02/24/2010  . Essential hypertension 02/24/2010  . Cerebral artery occlusion with cerebral infarction ([place]) 02/24/2010  . ESRD on dialysis ([place]) 02/24/2010  . Secondary renal hyperparathyroidism ([place]) 02/24/2010    Orientation RESPIRATION BLADDER Height & Weight     Self, Time, Situation, Place  Normal Continent Weight: 166 lb 0.1 oz (75.3 kg) Height:     BEHAVIORAL SYMPTOMS/MOOD NEUROLOGICAL BOWEL NUTRITION STATUS      Continent Diet(see discharge summary)  AMBULATORY STATUS COMMUNICATION OF NEEDS Skin   Limited Assist Verbally Other (Comment)(AV Fistula Left Thigh)                       Personal Care Assistance Level of Assistance  Bathing, Feeding, Dressing Bathing Assistance: Limited assistance Feeding assistance: Independent Dressing Assistance: Limited assistance     Functional Limitations Info  Sight, Hearing, Speech Sight Info: Adequate(wears glasses) Hearing Info: Adequate Speech Info: Adequate    SPECIAL CARE FACTORS FREQUENCY  PT (By licensed PT)     PT Frequency: 3x week              Contractures      Additional Factors Info  Code Status, Allergies, Insulin Sliding Scale Code Status Info: Full Code Allergies Info: No Known Allergies   Insulin Sliding Scale Info: insulin aspart (novoLOG) injection 0-5 Units daily at bedtime; insulin  aspart (novoLOG) injection 0-9 Units 3x daily with meals       Current Medications (08/01/2017):  This is the current hospital active medication list Current Facility-Administered Medications  Medication Dose Route Frequency Provider Last Rate Last Dose  . acetaminophen (TYLENOL) tablet 650 mg  650 mg Oral Q6H PRN Elwyn Reach, MD       Or  . acetaminophen  (TYLENOL) suppository 650 mg  650 mg Rectal Q6H PRN Elwyn Reach, MD      . acetaminophen (TYLENOL) tablet 650 mg  650 mg Oral BID Elwyn Reach, MD   650 mg at 07/31/17 2227  . Ampicillin-Sulbactam (UNASYN) 3 g in sodium chloride 0.9 % 100 mL IVPB  3 g Intravenous Q12H Dang, Thuy D, RPH      . calcitRIOL (ROCALTROL) capsule 1.75 mcg  1.75 mcg Oral Q T,Th,Sa-HD Alric Seton, PA-C   1.75 mcg at 07/31/17 1315  . calcium acetate (PHOSLO) capsule 2,001 mg  2,001 mg Oral TID WC Gala Romney L, MD   2,001 mg at 08/01/17 1314  . calcium acetate (PHOSLO) capsule 667 mg  667 mg Oral PRN Gala Romney L, MD      . calcium carbonate (dosed in mg elemental calcium) suspension 500 mg of elemental calcium  500 mg of elemental calcium Oral Q6H PRN Elwyn Reach, MD      . camphor-menthol (SARNA) lotion 1 application  1 application Topical B2W PRN Elwyn Reach, MD       And  . hydrOXYzine (ATARAX/VISTARIL) tablet 25 mg  25 mg Oral Q8H PRN Elwyn Reach, MD      . Derrill Memo ON 08/03/2017] cinacalcet (SENSIPAR) tablet 30 mg  30 mg Oral Q T,Th,Sa-HD Alric Seton, PA-C      . [START ON 08/05/2017] Darbepoetin Alfa (ARANESP) injection 100 mcg  100 mcg Intravenous Q Thu-HD Alric Seton, PA-C      . dextrose 50 % solution           . diclofenac sodium (VOLTAREN) 1 % transdermal gel 2 g  2 g Topical BID PRN Gala Romney L, MD      . feeding supplement (NEPRO CARB STEADY) liquid 237 mL  237 mL Oral TID WC Alric Seton, PA-C      . heparin injection 5,000 Units  5,000 Units Subcutaneous Q8H Elwyn Reach, MD   5,000 Units at 08/01/17 0534  . insulin aspart (novoLOG) injection 0-5 Units  0-5 Units Subcutaneous QHS Vance Gather B, MD      . insulin aspart (novoLOG) injection 0-9 Units  0-9 Units Subcutaneous TID WC Patrecia Pour, MD   1 Units at 07/31/17 1315  . latanoprost (XALATAN) 0.005 % ophthalmic solution 1 drop  1 drop Left Eye QHS Elwyn Reach, MD   1 drop at 07/31/17 2229   . lidocaine-prilocaine (EMLA) cream 1 application  1 application Topical Q T,Th,Sa-HD Garba, Mohammad L, MD      . loratadine (CLARITIN) tablet 10 mg  10 mg Oral Daily Gala Romney L, MD   10 mg at 07/30/17 1016  . Melatonin TABS 4.5 mg  4.5 mg Oral QHS Gala Romney L, MD   4.5 mg at 07/31/17 2227  . metoCLOPramide (REGLAN) tablet 5 mg  5 mg Oral TID AC Patrecia Pour, MD   5 mg at 08/01/17 1315  . multivitamin (RENA-VIT) tablet 1 tablet  1 tablet Oral QHS Elwyn Reach, MD   1 tablet at 07/31/17  2226  . neomycin-bacitracin-polymyxin (NEOSPORIN) ointment 1 application  1 application Topical PRN Gala Romney L, MD      . neomycin-polymyxin b-dexamethasone (MAXITROL) ophthalmic suspension 1 drop  1 drop Right Eye BID Elwyn Reach, MD   1 drop at 07/31/17 2228  . ondansetron (ZOFRAN) tablet 4 mg  4 mg Oral Q6H PRN Elwyn Reach, MD       Or  . ondansetron (ZOFRAN) injection 4 mg  4 mg Intravenous Q6H PRN Elwyn Reach, MD   4 mg at 07/30/17 0748  . oxyCODONE (Oxy IR/ROXICODONE) immediate release tablet 5 mg  5 mg Oral BID Elwyn Reach, MD   5 mg at 07/31/17 2238  . polyethylene glycol (MIRALAX / GLYCOLAX) packet 17 g  17 g Oral BID Patrecia Pour, MD   17 g at 07/31/17 2225  . pravastatin (PRAVACHOL) tablet 20 mg  20 mg Oral QHS Elwyn Reach, MD   20 mg at 07/31/17 2227  . rOPINIRole (REQUIP) tablet 1 mg  1 mg Oral QHS Elwyn Reach, MD   1 mg at 07/31/17 2227  . senna-docusate (Senokot-S) tablet 2 tablet  2 tablet Oral QHS Vance Gather B, MD      . sorbitol 70 % solution 30 mL  30 mL Oral Daily Patrecia Pour, MD   30 mL at 07/30/17 1949  . traZODone (DESYREL) tablet 25 mg  25 mg Oral QHS PRN Elwyn Reach, MD         Discharge Medications: Please see discharge summary for a list of discharge medications.  Relevant Imaging Results:  Relevant Lab Results:   Additional Information SS#[id number]; TTS HD  [place], LCSWA

## 2017-08-01 NOTE — Evaluation (Addendum)
Physical Therapy Evaluation Patient Details Name: Stefanie [name] MRN: [id number] DOB: October 06, 1945 Today's Date: 08/01/2017   History of Present Illness  Pt is a 72 y.o. female with medical history significant of end-stage renal disease on hemodialysis on Tuesdays, Thursdays and Saturdays who presented with 2 days history of nausea, vomiting and epigastric pain.  PMH consists of DM, CVA and CAD.     Clinical Impression  Pt admitted with above diagnosis. Pt currently with functional limitations due to the deficits listed below (see PT Problem List). PTA pt resided at [facility]. She utilizes a wheelchair for mobility, self-propelling. On eval, she required min assist bed mobility and min assist squat-pivot transfer bed to recliner. BP taken sitting EOB, 112/91. Pt will benefit from skilled PT to increase their independence and safety with mobility to allow discharge to the venue listed below.       Follow Up Recommendations Home health PT;Supervision for mobility/OOB    Equipment Recommendations  None recommended by PT    Recommendations for Other Services       Precautions / Restrictions Precautions Precautions: Fall Restrictions Weight Bearing Restrictions: No      Mobility  Bed Mobility Overal bed mobility: Needs Assistance Bed Mobility: Supine to Sit     Supine to sit: Min assist     General bed mobility comments: +rail, assist with bed pad to scoot to EOB  Transfers Overall transfer level: Needs assistance Equipment used: None Transfers: Squat Pivot Transfers     Squat pivot transfers: Min assist     General transfer comment: pivot transfer toward right bed to recliner. BP taken sitting EOB prior to transfer, 112/91  Ambulation/Gait             General Gait Details: nonambulatory  Stairs            Wheelchair Mobility    Modified Rankin (Stroke Patients Only)       Balance Overall balance assessment: Needs  assistance Sitting-balance support: No upper extremity supported;Feet supported Sitting balance-Leahy Scale: Fair     Standing balance support: Bilateral upper extremity supported;During functional activity Standing balance-Leahy Scale: Poor                               Pertinent Vitals/Pain Pain Assessment: No/denies pain    Home Living Family/patient expects to be discharged to:: Assisted living([facility])               Home Equipment: Wheelchair - manual;Grab bars - tub/shower;Grab bars - toilet      Prior Function Level of Independence: Needs assistance   Gait / Transfers Assistance Needed: wheelchair for mobility, self-propels  ADL's / Homemaking Assistance Needed: ALF staff assists with showering  Comments: Pt reports she is in the process of getting an electric w/c.     Hand Dominance   Dominant Hand: Right    Extremity/Trunk Assessment   Upper Extremity Assessment Upper Extremity Assessment: RUE deficits/detail RUE Deficits / Details: wears glove R hand due to abnormal sensitivity     Lower Extremity Assessment Lower Extremity Assessment: RLE deficits/detail RLE Deficits / Details: tight/shortened hamstrings, lacking approx 20 degrees knee extension     Cervical / Trunk Assessment Cervical / Trunk Assessment: Kyphotic  Communication   Communication: No difficulties  Cognition Arousal/Alertness: Awake/alert Behavior During Therapy: WFL for tasks assessed/performed Overall Cognitive Status: Within Functional Limits for tasks assessed  General Comments      Exercises     Assessment/Plan    PT Assessment Patient needs continued PT services  PT Problem List Decreased strength;Decreased mobility;Decreased safety awareness;Decreased activity tolerance;Decreased balance       PT Treatment Interventions Therapeutic activities;Therapeutic exercise;Balance  training;Patient/family education;Functional mobility training    PT Goals (Current goals can be found in the Care Plan section)  Acute Rehab PT Goals Patient Stated Goal: get an electric w/c PT Goal Formulation: With patient Time For Goal Achievement: 08/15/17 Potential to Achieve Goals: Good    Frequency Min 3X/week   Barriers to discharge        Co-evaluation               AM-PAC PT "6 Clicks" Daily Activity  Outcome Measure Difficulty turning over in bed (including adjusting bedclothes, sheets and blankets)?: A Lot Difficulty moving from lying on back to sitting on the side of the bed? : A Lot Difficulty sitting down on and standing up from a chair with arms (e.g., wheelchair, bedside commode, etc,.)?: Unable Help needed moving to and from a bed to chair (including a wheelchair)?: A Little Help needed walking in hospital room?: Total Help needed climbing 3-5 steps with a railing? : Total 6 Click Score: 10    End of Session Equipment Utilized During Treatment: Gait belt Activity Tolerance: Patient tolerated treatment well Patient left: in chair;with call bell/phone within reach Nurse Communication: Mobility status PT Visit Diagnosis: Other abnormalities of gait and mobility (R26.89);Muscle weakness (generalized) (M62.81)    Time: 1203-1219 PT Time Calculation (min) (ACUTE ONLY): 16 min   Charges:   PT Evaluation $PT Eval Moderate Complexity: 1 Mod     PT G Codes:        Lorrin Goodell, PT  Office # [phone] Pager [phone]   Lorriane Shire 08/01/2017, 1:07 PM

## 2017-08-01 NOTE — Progress Notes (Signed)
Hypoglycemic Event  CBG: 0754- 59mg /dl; 7944 46mg /dl;  Treatment: 0754-4 oz orange juice; 0824 attempted to give 25 ml destrose but lost IV access gave 3 dextrose chew tabs instead  Symptoms: non symptomatic  Follow-up CBG: Time: 0859  CBG Result: 73 mg/dl  Possible Reasons for Event: poor appetite and low food/beverage intake.  Comments/MD notified: will continue to monitor patient   Samantha Crimes

## 2017-08-01 NOTE — Progress Notes (Signed)
Called primary nurse to make her aware that pt's HD orders had been modified for tx to be done on Sunday

## 2017-08-01 NOTE — Consult Note (Addendum)
[facility] Renal Consultation Note    Indication for Consultation:  Management of ESRD/hemodialysis; anemia, hypertension/volume and secondary hyperparathyroidism PCP: Donnetta Hutching, MD  HPI: Stefanie [name] is a 72 y.o. 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 She was initially admitted to OBS status then converted to inpatient.   Abdominal CT5/16  showed ^ stool burden.  She states they final got her bowels to move yesterday.  She is able to eat small amounts.  She has had a cough since last Tuesday with fever spike ysterday afternoon to 100.9.  Cough is loose.  She is always SOB. WC bound.  No orthopnea.  No pain anywhere today.   Labs today K 3.7 glu 60 BUN 43 Cr 8.59 Ca 9.6 alb 3 P 4 hgb 10.1 WBC 11.4. CXR done today for cough showed LLL consolidation with small amount left pleural fluid.   Past Medical History:  Diagnosis Date  . Anemia   . CAD (coronary artery disease)   . CHF (congestive heart failure) ([place])   . Constipation   . CVA (cerebral vascular accident) ([place]) [place]   left sided weakness  . Diabetes mellitus   . End stage renal disease ([place])    initiated HD 2006  . Hx of cardiovascular stress test    a. MV 03/2010:  no ischemia, EF 55%  . Hx of echocardiogram    a. Echo 03/2010: EF 55-60%, Gr 1 diast dysfn, trivial MS, mild to mod LAE, PASP 34, ;  b. Echo 11/13: mild LVH, EF 60%, Gr 1 diast dysfn, Ao sclerosis, no AS, MAC, slight MS, mean 3 mmHg, PASP 34  . Hyperlipidemia   . Hypertension   . Protein malnutrition ([place])   . Renal insufficiency   . Retinal detachment, bilateral   . Secondary hyperparathyroidism ([place])    Past Surgical History:  Procedure Laterality Date  . ARTERIOVENOUS GRAFT PLACEMENT     BUE numerous times  . ARTERIOVENOUS GRAFT PLACEMENT  11/13/10   Lt femoral - Dr. Kellie Simmering  . CARDIAC CATHETERIZATION N/A 02/05/2015   Procedure: Left Heart Cath and  Coronary Angiography;  Surgeon: Jettie Booze, MD;  Location: [facility];  Service: Cardiovascular;  Laterality: N/A;  . CATARACT EXTRACTION    . DG AV DIALYSIS GRAFT DECLOT OR  06/06/11, 08/26/11   performed in IR  . PERIPHERAL VASCULAR CATHETERIZATION N/A 02/06/2015   Procedure: A/V Shuntogram;  Surgeon: Elam Dutch, MD;  Location: [facility];  Service: Cardiovascular;  Laterality: N/A;  . PERIPHERAL VASCULAR CATHETERIZATION Left 02/26/2016   Procedure: A/V Shuntogram;  Surgeon: Waynetta Sandy, MD;  Location: [place] [facility];  Service: Cardiovascular;  Laterality: Left;  . PERIPHERAL VASCULAR CATHETERIZATION Left 02/26/2016   Procedure: Peripheral Vascular Balloon Angioplasty;  Surgeon: Waynetta Sandy, MD;  Location: [place] [facility];  Service: Cardiovascular;  Laterality: Left;  Lt thigh avf  . SHUNTOGRAM N/A 05/29/2013   Procedure: Earney Mallet;  Surgeon: Conrad Dixon, MD;  Location: [facility] C[facility];  Service: Cardiovascular;  Laterality: N/A;  . SHUNTOGRAM Left 03/01/2014   Procedure: Earney Mallet;  Surgeon: Conrad [place], MD;  Location: [facility] CATH LAB;  Service: Cardiovascular;  Laterality: Left;  . TUBAL LIGATION     Family History  Problem Relation Age of Onset  . Heart disease Mother   . Diabetes Mother   .  Hypertension Mother   . Heart attack Mother   . Diabetes Other   . Kidney disease Other   . Cardiomyopathy Unknown   . Diabetes Daughter   . Diabetes Son   . Hypertension Son    Social History:  reports that she quit smoking about 34 years ago. She has never used smokeless tobacco. She reports that she does not drink alcohol or use drugs. No Known Allergies Prior to Admission medications   Medication Sig Start Date End Date Taking? Authorizing Provider  acetaminophen (TYLENOL) 325 MG tablet Take 650 mg by mouth 2 (two) times daily.   Yes [provider]  b complex-vitamin c-folic acid (NEPHRO-VITE) 0.8 MG TABS tablet Take 1  tablet by mouth See admin instructions. Every Monday, Wednesday and Friday   Yes [provider]  calcium acetate (PHOSLO) 667 MG capsule Take 667-2,001 mg by mouth See admin instructions. Take 3 capsules three times a day with meals and take 1 capsule twice daily with snacks   Yes [provider]  diclofenac sodium (VOLTAREN) 1 % GEL Apply 2 g topically See admin instructions. Apply to both knees daily as needed for pain and to right hand 2-3 times a day as needed for pain (BRAND NAME VOLTAREN)   Yes [provider]  glipiZIDE (GLUCOTROL) 10 MG tablet Take 10 mg by mouth 2 (two) times daily before a meal.    Yes [provider]  latanoprost (XALATAN) 0.005 % ophthalmic solution Place 1 drop into the left eye at bedtime.   Yes [provider]  lidocaine-prilocaine (EMLA) cream Apply 1 application topically as needed (topical anesthesia for hemodialysis if Gebauers and Lidocaine injection are ineffective.). Patient taking differently: Apply 1 application topically 3 (three) times a week. Tuesday, Thursday and saturday 02/25/15  Yes Love, Ivan Anchors, PA-C  lisinopril (PRINIVIL,ZESTRIL) 5 MG tablet Take 5 mg by mouth every morning.    Yes [provider]  loratadine (CLARITIN) 10 MG tablet Take 10 mg by mouth daily.   Yes [provider]  Melatonin 5 MG TABS Take 5 mg by mouth at bedtime.   Yes [provider]  neomycin-bacitracin-polymyxin (NEOSPORIN) ointment Apply 1 application topically as needed for wound care.   Yes [provider]  neomycin-polymyxin b-dexamethasone (MAXITROL) 3.5-10000-0.1 SUSP Place 1 drop into the right eye 2 (two) times daily. 07/12/17  Yes [provider]  oxyCODONE (OXY IR/ROXICODONE) 5 MG immediate release tablet Take 5 mg by mouth 2 (two) times daily.    Yes [provider]  polyethylene glycol (MIRALAX) packet Use 17 gms three times daily until bowels move, with maximum of 3  consecutive days Patient taking differently: Take 17 g by mouth See admin instructions. On Tuesday, Thursday and Saturday as needed 09/02/15  Yes Upstill, Nehemiah Settle, PA-C  pravastatin (PRAVACHOL) 20 MG tablet Take 20 mg by mouth at bedtime.   Yes [provider]  rOPINIRole (REQUIP) 1 MG tablet Take 1 mg by mouth at bedtime.    Yes [provider]  sennosides-docusate sodium (SENOKOT-S) 8.6-50 MG tablet Take 2 tablets by mouth daily as needed for constipation.    Yes [provider]  sorbitol 70 % solution Take 30 mLs by mouth daily as needed (for constipation).   Yes [provider]  traZODone (DESYREL) 50 MG tablet Take 25 mg by mouth as needed.  07/06/17  Yes [provider]  carvedilol (COREG) 3.125 MG tablet Take 1 tablet (3.125 mg total) by mouth 2 (  two) times daily with a meal. Patient not taking: Reported on 04/09/2015 02/25/15 09/04/15  Bary Leriche, PA-C   Current Facility-Administered Medications  Medication Dose Route Frequency Provider Last Rate Last Dose  . acetaminophen (TYLENOL) tablet 650 mg  650 mg Oral Q6H PRN Elwyn Reach, MD       Or  . acetaminophen (TYLENOL) suppository 650 mg  650 mg Rectal Q6H PRN Elwyn Reach, MD      . acetaminophen (TYLENOL) tablet 650 mg  650 mg Oral BID Elwyn Reach, MD   650 mg at 07/31/17 2227  . calcitRIOL (ROCALTROL) capsule 1.75 mcg  1.75 mcg Oral Q T,Th,Sa-HD Alric Seton, PA-C   1.75 mcg at 07/31/17 1315  . calcium acetate (PHOSLO) capsule 2,001 mg  2,001 mg Oral TID WC Elwyn Reach, MD   2,001 mg at 07/31/17 1857  . calcium acetate (PHOSLO) capsule 667 mg  667 mg Oral PRN Gala Romney L, MD      . calcium carbonate (dosed in mg elemental calcium) suspension 500 mg of elemental calcium  500 mg of elemental calcium Oral Q6H PRN Elwyn Reach, MD      . camphor-menthol (SARNA) lotion 1 application  1 application Topical V7B PRN Elwyn Reach, MD       And  . hydrOXYzine  (ATARAX/VISTARIL) tablet 25 mg  25 mg Oral Q8H PRN Garba, Mohammad L, MD      . dextrose 50 % solution           . diclofenac sodium (VOLTAREN) 1 % transdermal gel 2 g  2 g Topical BID PRN Gala Romney L, MD      . feeding supplement (NEPRO CARB STEADY) liquid 237 mL  237 mL Oral TID PRN Gala Romney L, MD      . heparin injection 5,000 Units  5,000 Units Subcutaneous Q8H Elwyn Reach, MD   5,000 Units at 08/01/17 0534  . insulin aspart (novoLOG) injection 0-5 Units  0-5 Units Subcutaneous QHS Vance Gather B, MD      . insulin aspart (novoLOG) injection 0-9 Units  0-9 Units Subcutaneous TID WC Patrecia Pour, MD   1 Units at 07/31/17 1315  . latanoprost (XALATAN) 0.005 % ophthalmic solution 1 drop  1 drop Left Eye QHS Elwyn Reach, MD   1 drop at 07/31/17 2229  . lidocaine-prilocaine (EMLA) cream 1 application  1 application Topical Q T,Th,Sa-HD Garba, Mohammad L, MD      . loratadine (CLARITIN) tablet 10 mg  10 mg Oral Daily Gala Romney L, MD   10 mg at 07/30/17 1016  . Melatonin TABS 4.5 mg  4.5 mg Oral QHS Gala Romney L, MD   4.5 mg at 07/31/17 2227  . metoCLOPramide (REGLAN) tablet 5 mg  5 mg Oral TID AC Patrecia Pour, MD   5 mg at 07/31/17 1858  . multivitamin (RENA-VIT) tablet 1 tablet  1 tablet Oral QHS Elwyn Reach, MD   1 tablet at 07/31/17 2226  . neomycin-bacitracin-polymyxin (NEOSPORIN) ointment 1 application  1 application Topical PRN Elwyn Reach, MD      . neomycin-polymyxin b-dexamethasone (MAXITROL) ophthalmic suspension 1 drop  1 drop Right Eye BID Elwyn Reach, MD   1 drop at 07/31/17 2228  . ondansetron (ZOFRAN) tablet 4 mg  4 mg Oral Q6H PRN Elwyn Reach, MD       Or  . ondansetron (ZOFRAN) injection 4 mg  4  mg Intravenous Q6H PRN Elwyn Reach, MD   4 mg at 07/30/17 0748  . oxyCODONE (Oxy IR/ROXICODONE) immediate release tablet 5 mg  5 mg Oral BID Elwyn Reach, MD   5 mg at 07/31/17 2238  . polyethylene glycol (MIRALAX /  GLYCOLAX) packet 17 g  17 g Oral BID Patrecia Pour, MD   17 g at 07/31/17 2225  . pravastatin (PRAVACHOL) tablet 20 mg  20 mg Oral QHS Elwyn Reach, MD   20 mg at 07/31/17 2227  . rOPINIRole (REQUIP) tablet 1 mg  1 mg Oral QHS Elwyn Reach, MD   1 mg at 07/31/17 2227  . senna-docusate (Senokot-S) tablet 2 tablet  2 tablet Oral BID Patrecia Pour, MD   2 tablet at 07/31/17 2226  . sorbitol 70 % solution 30 mL  30 mL Oral Daily Patrecia Pour, MD   30 mL at 07/30/17 1949  . traZODone (DESYREL) tablet 25 mg  25 mg Oral QHS PRN Elwyn Reach, MD       Labs: Basic Metabolic Panel: Recent Labs  Lab 07/29/17 1708 07/30/17 0237 08/01/17 0557  NA 140 140 136  K 3.7 3.8 3.7  CL 87* 90* 89*  CO2 35* 31 30  GLUCOSE 175* 313* 60*  BUN 5* 15 43*  CREATININE 4.23* 5.42* 8.59*  CALCIUM 10.8* 9.7 9.6  PHOS  --   --  4.0   Liver Function Tests: Recent Labs  Lab 07/29/17 1708 07/30/17 0237 08/01/17 0557  AST 38 27  --   ALT 20 17  --   ALKPHOS 98 84  --   BILITOT 0.9 1.2  --   PROT 8.4* 7.2  --   ALBUMIN 4.5 3.8 3.0*   Recent Labs  Lab 07/29/17 1708  LIPASE 26   No results for input(s): AMMONIA in the last 168 hours. CBC: Recent Labs  Lab 07/29/17 1708 07/30/17 0237 08/01/17 0557  WBC 6.3 10.3 11.4*  NEUTROABS  --  8.9*  --   HGB 11.7* 11.3* 10.1*  HCT 38.1 37.0 32.2*  MCV 101.6* 104.5* 100.6*  PLT 140* 133* 135*   Cardiac Enzymes: No results for input(s): CKTOTAL, CKMB, CKMBINDEX, TROPONINI in the last 168 hours. CBG: Recent Labs  Lab 07/31/17 1653 07/31/17 2144 08/01/17 0754 08/01/17 0824 08/01/17 0859  GLUCAP 111* 92 59* 46* 73   Iron Studies: No results for input(s): IRON, TIBC, TRANSFERRIN, FERRITIN in the last 72 hours. Studies/Results: Dg Chest Port 1 View  Result Date: 08/01/2017 CLINICAL DATA:  Chest congestion with cough. EXAM: PORTABLE CHEST 1 VIEW COMPARISON:  07/29/2017 FINDINGS: Lungs are adequately inflated demonstrate airspace  consolidation over the left lower lobe and minimal hazy density in the right infrahilar region. Possible small amount left pleural fluid. Mild stable cardiomegaly. Stable prominence of the main pulmonary artery segment. Remainder of the exam is unchanged. IMPRESSION: Airspace consolidation over the left lower lobe with mild hazy density over the right infrahilar region likely due to infection. Possible small amount left pleural fluid. Stable cardiomegaly. Stable prominence of the main pulmonary artery segment which may indicate a degree of pulmonary arterial hypertension. Electronically Signed   By: Marin Olp M.D.   On: 08/01/2017 10:58    ROS: As per HPI otherwise negative.  Physical Exam: Vitals:   07/31/17 2318 07/31/17 2349 08/01/17 0057 08/01/17 0508  BP: (!) 81/41 (!) 88/44 107/77 (!) 105/48  Pulse: 85 86 76 78  Resp:  18  Temp:    98.4 F (36.9 C)  TempSrc:    Oral  SpO2:    95%  Weight:    75.3 kg (166 lb 0.1 oz)     General:  Ill elderly lady appears older than age NAD Head: NCAT sclera not icteric MMM dentition poor Neck: Supple.  Lungs: dim BS, few crackles. Rattley loose. cough Heart: RRR with S1 S2.  Abdomen: soft NT + BS Lower extremities:without edema  Neuro: A & O  X 3. Moves all extremities spontaneously. Psych:  Responds to questions appropriately with a normal affect. Dialysis Access:left thigh AVGG + bruit  Dialysis Orders:  HD orders TTS [facility] 4 hr 2K 2 Ca 450/800 left thigh AVGG  EDW 74.5  heparin 3000 sensipar 180 with HD, Micera 150 q 2 weeks 5/9 calcitriol 1.75  Runs full treatments - gets to edw most of the time gains 1-3 L-  Recent labs; Hgb 9.5 stable iPTH/Ca/P in goal.  Assessment/Plan: 1. Abdominal pain, N, V, in the setting of constipation - treatment per primary - improved stool this am 2. ESRD -  TTS K 3.7 - HD scheduled for yesterday was postponed until today then back on schedule Tuesday if still here 3. Hypertension/volume  - hypotensive  yesterday PM - ok now;    BP noted to drop in the the 70 - 80s during outpatient HD treatments 4. Anemia  - hgb 10.1 stable - due for redose ESA this week - use lower dose 5. Metabolic bone disease -  Continue calcitriol/binders; resume sensipar Tuesday if still here 6. Nutrition - renal diet/vits - alb 3 - add nepro 7. DM - per primary 8. CAD/CHF-current meds  9. Fever  - ? LLL PNA + cough - per primary  Myriam Jacobson, PA-C Pablo Pena [phone] 08/01/2017, 12:25 PM   Pt seen, examined and agree w A/P as above. ESRD pt with nausea/ vomiting/ constipation appears improved during obs stay but now w/ fever and wet cough and left base density prob has pneumonia have d/w primary md who feels the same. Will follow.  Kelly Splinter MD [employer] pager [phone]   08/01/2017, 1:13 PM

## 2017-08-01 NOTE — Progress Notes (Signed)
SLP Cancellation Note  Patient Details Name: Stefanie [name] MRN: [id number] DOB: April 28, 1945   Cancelled treatment:       Reason Eval/Treat Not Completed: Patient at procedure or test/unavailable. Pt in HD. Will f/u.  Deneise Lever, [place], [place] Speech-Language Pathologist [place] 08/01/2017, 4:12 PM

## 2017-08-01 NOTE — Progress Notes (Signed)
Pt had a large loose bowl movement and while working with her assessed lung sounds again and sounds very congested. Dr on call made aware will continue to monitor. VS stable

## 2017-08-01 NOTE — Progress Notes (Signed)
Pharmacy Antibiotic Note  Stefanie [name] is a 72 y.o. female admitted on 07/29/2017 with abdominal pain, nausea and vomiting.  Pharmacy has been consulted for Unasyn dosing for aspiration PNA.  Patient has ESRD on TTS HD.  Tmax 100.9, WBC 11.4.   Plan: Unasyn 3gm IV Q12H Pharmacy will sign off as dosage adjustment is unnecessary.  Thank you for the consult!   Weight: 166 lb 0.1 oz (75.3 kg)  Temp (24hrs), Avg:99 F (37.2 C), Min:98.3 F (36.8 C), Max:100.9 F (38.3 C)  Recent Labs  Lab 07/29/17 1708 07/29/17 2000 07/29/17 2255 07/30/17 0237 08/01/17 0557  WBC 6.3  --   --  10.3 11.4*  CREATININE 4.23*  --   --  5.42* 8.59*  LATICACIDVEN  --  2.38* 2.40*  --   --     CrCl cannot be calculated (Unknown ideal weight.).    No Known Allergies   Unasyn 5/19 >>  5/16 BCx - NGTD 5/17 MRSA PCR - negative 5/19 BCx -    Darya Bigler D. Mina Marble, PharmD, BCPS, [place] Pager:  [phone] 08/01/2017, 3:07 PM

## 2017-08-01 NOTE — Progress Notes (Signed)
Second bolus of fluid complete BP 107/77 HR 76 will continue to monitor

## 2017-08-01 NOTE — Progress Notes (Signed)
PROGRESS NOTE  Subjective: Stefanie [name] is a 72 y.o. female with a history of ESRD, T2DM, HTN who presented to the ED with intractable nausea and vomiting. Work up reassuring, though symptoms continued and pt unable to tolerate po so was brought in for observation. She reports her last BM was 5/13 and stool burden was noted on CT abdomen/pelvis. Bowel regimen was started including enemas with eventual resolution of constipation 5/18. She developed a cough, fever and infiltrates on CXR consistent with pneumonia. With concern for aspiration, unasyn started, SLP evaluation ordered. Per oral intake has not improved.   Subjective: States she usually only eats at night, but is afraid to try anything for fear it will make her nauseated. Feels "a lot better" since the BMs overnight. Had fever this morning but didn't know it.  Objective: BP (!) 128/91 (BP Location: Right Arm)   Pulse (!) 109   Temp 98.4 F (36.9 C) (Oral)   Resp 18   Wt 75.3 kg (166 lb 0.1 oz)   SpO2 94%   BMI 28.49 kg/m   Gen: Chronically ill-appearing female sitting in chair slumped over with drool coming from mouth.  HEENT: MMM, edentulous Pulm: Upper airway sounds transmitted worse than previously, diminished L > R base. CV: RRR, no murmur, no JVD, no edema GI: Soft, not tender or distended Skin: No ulcers. Left thigh AVG without erythema. Neuro: Alert, oriented, no focal deficits Psych: Euthymic mood, congruent affect, appropriate behavior.  Assessment & Plan: Abdominal pain, intractable nausea and vomiting in setting of constipation: - Constipation now resolved and pt not taking much by mouth.  - Will check SLP evaluation and formal swallow study w/dilated esophagus noted on previous CT. Pending the results of this, would consult GI if EGD needed. - Will start reglan. May consider gastric emptying study if remains symptomatic. - Urged to get OOB (wheelchair-bound), PT eval. - Continue home oxycodone to avoid  withdrawal.  Fever, LLL infiltrate: Now having a cough, WBC 11.4, and have noted significant drooling. Also had dilated esophagus on previous imaging as above. High suspicion for aspiration pneumonia. Note MRSA PCR negative. - Cover w/unasyn - Repeat blood cultures as pt fevered overnight, check sputum culture and urine antigens.    Lactic acid elevation: Without acidosis. Uncertain clinical significance with ESRD. - Monitor blood cultures and fever curve off abx   ESRD:  - Continue home medications. - HD per nephrology  T2DM with hyperglycemia: HbA1c 7.7% - Cover with renal SSI qAC and HS. Has not required insulin due to not taking anything by mouth.  CAD and chronic combined CHF: No anginal complaints. - Appears compensated, given 1L NS in ED.  - Volume management with HD UF. If continues to take so little po, may need gentle fluids, defer to nephrology. - Not on ASA for unclear reasons, possibly due to bleeding complications in the past. Will need cardiology follow up.  - Continue statin.   HTN:  - BPs soft, so hold low dose lisinopril today  Patrecia Pour, MD [facility] 08/01/2017, 3:02 PM

## 2017-08-02 ENCOUNTER — Encounter (HOSPITAL_COMMUNITY): Payer: Self-pay | Admitting: Gastroenterology

## 2017-08-02 LAB — GLUCOSE, CAPILLARY
GLUCOSE-CAPILLARY: 277 mg/dL — AB (ref 65–99)
Glucose-Capillary: 148 mg/dL — ABNORMAL HIGH (ref 65–99)
Glucose-Capillary: 197 mg/dL — ABNORMAL HIGH (ref 65–99)
Glucose-Capillary: 248 mg/dL — ABNORMAL HIGH (ref 65–99)

## 2017-08-02 LAB — CBC
HEMATOCRIT: 30.9 % — AB (ref 36.0–46.0)
HEMOGLOBIN: 9.5 g/dL — AB (ref 12.0–15.0)
MCH: 31.6 pg (ref 26.0–34.0)
MCHC: 30.7 g/dL (ref 30.0–36.0)
MCV: 102.7 fL — AB (ref 78.0–100.0)
Platelets: 108 10*3/uL — ABNORMAL LOW (ref 150–400)
RBC: 3.01 MIL/uL — ABNORMAL LOW (ref 3.87–5.11)
RDW: 17.6 % — AB (ref 11.5–15.5)
WBC: 8.4 10*3/uL (ref 4.0–10.5)

## 2017-08-02 NOTE — Progress Notes (Addendum)
PROGRESS NOTE  Subjective: Stefanie [name] is a 72 y.o. female with a history of ESRD, T2DM, HTN who presented to the ED with intractable nausea and vomiting. Work up reassuring, though symptoms continued and pt unable to tolerate po so was brought in for observation. She reports her last BM was 5/13 and stool burden was noted on CT abdomen/pelvis. Bowel regimen was started including enemas with eventual resolution of constipation 5/18. She developed a cough, fever and infiltrates on CXR consistent with pneumonia. With concern for aspiration, unasyn started, SLP evaluation ordered. Per oral intake has not improved.   Subjective: Ate a few bites of breakfast and drank some milk and coffee. No vomiting, but feels uneasy about eating more. Says food seems to get stuck sometimes in the lower throat. Coughing less, denies fever. HD went well last night.   Objective: BP (!) 123/48 (BP Location: Left Arm)   Pulse 91   Temp 98.5 F (36.9 C) (Oral)   Resp 18   Wt 73.4 kg (161 lb 13.1 oz)   SpO2 98%   BMI 27.78 kg/m   Gen: Pleasant, chronically ill-appearing female sitting in chair in no distress HEENT: MMM, edentulous Pulm: Upper airway sounds transmitted, diminished w/rhonchi at left base CV: RRR, no murmur, no JVD, no edema GI: Soft, not tender or distended Skin: No ulcers. Left thigh AVG without erythema. Neuro: Alert, oriented, no focal deficits Psych: Euthymic mood, congruent affect, appropriate behavior.  Assessment & Plan: Abdominal pain, intractable nausea and vomiting in setting of constipation: - Constipation now resolved, continue preventive regimen. Symptoms remain.  - SLP evaluation shows mild aspiration risk.  Dilated esophagus noted on CT with globus sensation will consulted GI for consideration of further work up. Has had colonoscopy by [facility] showing tubular adenoma, diverticulosis of left colon  - ?Gastroparesis, started empiric reglan. May consider gastric emptying  study if remains symptomatic. - Urged to get OOB (wheelchair-bound), PT eval. - Continue home oxycodone. - Feeding supplement ordered.  Aspiration pneumonia: Fever, LLL infiltrate, cough, WBC 11.4, and have noted significant drooling. Also had dilated esophagus on previous imaging as above. High suspicion for aspiration pneumonia. Note MRSA PCR negative. - Cover w/unasyn. Defervesced and WBC normalized.  - Monitor sputum and repeat blood cultures.   - SLP evaluation as above.  Lactic acid elevation: Without acidosis. Uncertain clinical significance with ESRD. - Monitor blood cultures and fever curve  ESRD:  - Continue home medications. - HD per nephrology 5/19.  T2DM with hyperglycemia: HbA1c 7.7% - Cover with renal SSI qAC and HS. Has not required insulin due to not taking anything by mouth.  CAD and chronic combined CHF: No anginal complaints. - Appears compensated, given 1L NS in ED.  - Volume management with HD UF. If continues to take so little po, may need gentle fluids, defer to nephrology. - Not on ASA for unclear reasons, possibly due to bleeding complications in the past. Will need cardiology follow up.  - Continue statin.   HTN:  - BPs soft, so holding low dose lisinopril  Patrecia Pour, MD [facility] 08/02/2017, 10:09 AM

## 2017-08-02 NOTE — Progress Notes (Signed)
Per Dr. Watt Climes, Pt to have EGD tomorrow morning at 07:45.  I phoned dialysis and advised them of the Pt's scheduled, and asked of they could coordinate with Endo and take the Pt after her procedure, Per Dr. Perley Jain request.  Per RN they would.  I did pass this information off the night RN for the patient.

## 2017-08-02 NOTE — Consult Note (Signed)
Reason for Consult:nausea vomiting episodic dysphagia abnormal CT Referring Physician: Hospital team  Stefanie [name] is an 72 y.o. female.  HPI: patient seen and examined and discussed with the hospital team and her hospital computer chart reviewed and currently she is doing okay without much complaints but periodically will have some swallowing issues and nausea and vomiting and does have some alternating diarrhea and constipation and she says she's never had an endoscopy or colonoscopy before and she has no other complaints and her swallowing eval was reviewed  Past Medical History:  Diagnosis Date  . Anemia   . CAD (coronary artery disease)   . CHF (congestive heart failure) ([place])   . Constipation   . CVA (cerebral vascular accident) ([place]) [place]   left sided weakness  . Diabetes mellitus   . End stage renal disease ([place])    initiated HD 2006  . Hx of cardiovascular stress test    a. MV 03/2010:  no ischemia, EF 55%  . Hx of echocardiogram    a. Echo 03/2010: EF 55-60%, Gr 1 diast dysfn, trivial MS, mild to mod LAE, PASP 34, ;  b. Echo 11/13: mild LVH, EF 60%, Gr 1 diast dysfn, Ao sclerosis, no AS, MAC, slight MS, mean 3 mmHg, PASP 34  . Hyperlipidemia   . Hypertension   . Protein malnutrition ([place])   . Renal insufficiency   . Retinal detachment, bilateral   . Secondary hyperparathyroidism ([place])     Past Surgical History:  Procedure Laterality Date  . ARTERIOVENOUS GRAFT PLACEMENT     BUE numerous times  . ARTERIOVENOUS GRAFT PLACEMENT  11/13/10   Lt femoral - Dr. Kellie Simmering  . CARDIAC CATHETERIZATION N/A 02/05/2015   Procedure: Left Heart Cath and Coronary Angiography;  Surgeon: Jettie Booze, MD;  Location: [facility];  Service: Cardiovascular;  Laterality: N/A;  . CATARACT EXTRACTION    . DG AV DIALYSIS GRAFT DECLOT OR  06/06/11, 08/26/11   performed in IR  . PERIPHERAL VASCULAR CATHETERIZATION N/A 02/06/2015   Procedure: A/V Shuntogram;  Surgeon: Elam Dutch, MD;  Location: [facility];  Service: Cardiovascular;  Laterality: N/A;  . PERIPHERAL VASCULAR CATHETERIZATION Left 02/26/2016   Procedure: A/V Shuntogram;  Surgeon: Waynetta Sandy, MD;  Location: [facility];  Service: Cardiovascular;  Laterality: Left;  . PERIPHERAL VASCULAR CATHETERIZATION Left 02/26/2016   Procedure: Peripheral Vascular Balloon Angioplasty;  Surgeon: Waynetta Sandy, MD;  Location: [facility];  Service: Cardiovascular;  Laterality: Left;  Lt thigh avf  . SHUNTOGRAM N/A 05/29/2013   Procedure: Earney Mallet;  Surgeon: Conrad Troy, MD;  Location: [facility] CATH LAB;  Service: Cardiovascular;  Laterality: N/A;  . SHUNTOGRAM Left 03/01/2014   Procedure: Earney Mallet;  Surgeon: Conrad Osceola, MD;  Location: [facility] CATH LAB;  Service: Cardiovascular;  Laterality: Left;  . TUBAL LIGATION      Family History  Problem Relation Age of Onset  . Heart disease Mother   . Diabetes Mother   . Hypertension Mother   . Heart attack Mother   . Diabetes Other   . Kidney disease Other   . Cardiomyopathy Unknown   . Diabetes Daughter   . Diabetes Son   . Hypertension Son     Social History:  reports that she quit smoking about 34 years ago. She has never used smokeless tobacco. She reports that she does not drink alcohol or use drugs.  Allergies: No Known Allergies  Medications:  I have reviewed the patient's current medications.  Results for orders placed or performed during the hospital encounter of 07/29/17 (from the past 48 hour(s))  Glucose, capillary     Status: Abnormal   Collection Time: 07/31/17  4:53 PM  Result Value Ref Range   Glucose-Capillary 111 (H) 65 - 99 mg/dL  Glucose, capillary     Status: None   Collection Time: 07/31/17  9:44 PM  Result Value Ref Range   Glucose-Capillary 92 65 - 99 mg/dL  Renal function panel     Status: Abnormal   Collection Time: 08/01/17  5:57 AM  Result Value Ref Range   Sodium 136 135 - 145 mmol/L    Potassium 3.7 3.5 - 5.1 mmol/L   Chloride 89 (L) 101 - 111 mmol/L   CO2 30 22 - 32 mmol/L   Glucose, Bld 60 (L) 65 - 99 mg/dL   BUN 43 (H) 6 - 20 mg/dL   Creatinine, Ser 8.59 (H) 0.44 - 1.00 mg/dL   Calcium 9.6 8.9 - 10.3 mg/dL   Phosphorus 4.0 2.5 - 4.6 mg/dL   Albumin 3.0 (L) 3.5 - 5.0 g/dL   GFR calc non Af Amer 4 (L) >60 mL/min   GFR calc Af Amer 5 (L) >60 mL/min    Comment: (NOTE) The eGFR has been calculated using the CKD EPI equation. This calculation has not been validated in all clinical situations. eGFR's persistently <60 mL/min signify possible Chronic Kidney Disease.    Anion gap 17 (H) 5 - 15    Comment: Performed at [facility], [place] [address], [place], [place] [address]  Hemoglobin A1c     Status: Abnormal   Collection Time: 08/01/17  5:57 AM  Result Value Ref Range   Hgb A1c MFr Bld 7.7 (H) 4.8 - 5.6 %    Comment: (NOTE) Pre diabetes:          5.7%-6.4% Diabetes:              >6.4% Glycemic control for   <7.0% adults with diabetes    Mean Plasma Glucose 174.29 mg/dL    Comment: Performed at [facility] [address] [place], [place] [address]  CBC     Status: Abnormal   Collection Time: 08/01/17  5:57 AM  Result Value Ref Range   WBC 11.4 (H) 4.0 - 10.5 K/uL   RBC 3.20 (L) 3.87 - 5.11 MIL/uL   Hemoglobin 10.1 (L) 12.0 - 15.0 g/dL   HCT 32.2 (L) 36.0 - 46.0 %   MCV 100.6 (H) 78.0 - 100.0 fL   MCH 31.6 26.0 - 34.0 pg   MCHC 31.4 30.0 - 36.0 g/dL   RDW 17.5 (H) 11.5 - 15.5 %   Platelets 135 (L) 150 - 400 K/uL    Comment: Performed at [facility], [place] [address], [place], [place] [address]  Glucose, capillary     Status: Abnormal   Collection Time: 08/01/17  7:54 AM  Result Value Ref Range   Glucose-Capillary 59 (L) 65 - 99 mg/dL  Glucose, capillary     Status: Abnormal   Collection Time: 08/01/17  8:24 AM  Result Value Ref Range   Glucose-Capillary 46 (L) 65 - 99 mg/dL  Glucose, capillary     Status: None   Collection Time:  08/01/17  8:59 AM  Result Value Ref Range   Glucose-Capillary 73 65 - 99 mg/dL  Glucose, capillary     Status: Abnormal  Collection Time: 08/01/17 12:51 PM  Result Value Ref Range   Glucose-Capillary 127 (H) 65 - 99 mg/dL  Culture, blood (routine x 2) Call MD if unable to obtain prior to antibiotics being given     Status: None (Preliminary result)   Collection Time: 08/01/17  5:15 PM  Result Value Ref Range   Specimen Description BLOOD SITE NOT SPECIFIED    Special Requests      BOTTLES DRAWN AEROBIC AND ANAEROBIC Blood Culture adequate volume   Culture      NO GROWTH < 24 HOURS Performed at [facility] [address] [place], [place] [address]    Report Status PENDING   Culture, blood (routine x 2) Call MD if unable to obtain prior to antibiotics being given     Status: None (Preliminary result)   Collection Time: 08/01/17  5:55 PM  Result Value Ref Range   Specimen Description BLOOD SITE NOT SPECIFIED    Special Requests      BOTTLES DRAWN AEROBIC AND ANAEROBIC Blood Culture adequate volume   Culture      NO GROWTH < 24 HOURS Performed at [facility], [address], [place], [place] [address]    Report Status PENDING   Glucose, capillary     Status: Abnormal   Collection Time: 08/01/17  9:21 PM  Result Value Ref Range   Glucose-Capillary 119 (H) 65 - 99 mg/dL  CBC     Status: Abnormal   Collection Time: 08/02/17  3:39 AM  Result Value Ref Range   WBC 8.4 4.0 - 10.5 K/uL   RBC 3.01 (L) 3.87 - 5.11 MIL/uL   Hemoglobin 9.5 (L) 12.0 - 15.0 g/dL   HCT 30.9 (L) 36.0 - 46.0 %   MCV 102.7 (H) 78.0 - 100.0 fL   MCH 31.6 26.0 - 34.0 pg   MCHC 30.7 30.0 - 36.0 g/dL   RDW 17.6 (H) 11.5 - 15.5 %   Platelets 108 (L) 150 - 400 K/uL    Comment: PLATELET COUNT CONFIRMED BY SMEAR Performed at [facility], [address], [place], [place] [address]   Glucose, capillary     Status: Abnormal   Collection Time: 08/02/17  7:48 AM  Result Value Ref Range    Glucose-Capillary 148 (H) 65 - 99 mg/dL   Comment 1 Notify RN    Comment 2 Document in Chart   Glucose, capillary     Status: Abnormal   Collection Time: 08/02/17 11:58 AM  Result Value Ref Range   Glucose-Capillary 197 (H) 65 - 99 mg/dL    Dg Chest Port 1 View  Result Date: 08/01/2017 CLINICAL DATA:  Chest congestion with cough. EXAM: PORTABLE CHEST 1 VIEW COMPARISON:  07/29/2017 FINDINGS: Lungs are adequately inflated demonstrate airspace consolidation over the left lower lobe and minimal hazy density in the right infrahilar region. Possible small amount left pleural fluid. Mild stable cardiomegaly. Stable prominence of the main pulmonary artery segment. Remainder of the exam is unchanged. IMPRESSION: Airspace consolidation over the left lower lobe with mild hazy density over the right infrahilar region likely due to infection. Possible small amount left pleural fluid. Stable cardiomegaly. Stable prominence of the main pulmonary artery segment which may indicate a degree of pulmonary arterial hypertension. Electronically Signed   By: Marin Olp M.D.   On: 08/01/2017 10:58    ROSnegative except above she does do dialysis tomorrow Blood pressure 109/64, pulse 81, temperature 98.6 F (37 C), temperature source  Oral, resp. rate 18, weight 73.4 kg (161 lb 13.1 oz), SpO2 100 %. Physical Examno acute distress lungs are clear regular rate and rhythm patent sitting comfortably in the chair abdomen is soft nontender no succussion splash occasional bowel sounds CT reviewed labs stable chronic anemia  Assessment/Plan: Multiple medical problems including abnormal CTwith a dilated esophagus Plan:the risks benefits methods of endoscopy was discussed with the patient and will proceed tomorrow at 7:45 AM just to be sure and then she could have dialysis afterward which I discussed with her nurse  Stefanie [name] 08/02/2017, 4:14 PM

## 2017-08-02 NOTE — Evaluation (Signed)
Clinical/Bedside Swallow Evaluation Patient Details  Name: Stefanie [name] MRN: [id number] Date of Birth: 06-15-1945  Today's Date: 08/02/2017 Time: SLP Start Time (ACUTE ONLY): 1342 SLP Stop Time (ACUTE ONLY): 1401 SLP Time Calculation (min) (ACUTE ONLY): 19 min  Past Medical History:  Past Medical History:  Diagnosis Date  . Anemia   . CAD (coronary artery disease)   . CHF (congestive heart failure) ([place])   . Constipation   . CVA (cerebral vascular accident) ([place]) [place]   left sided weakness  . Diabetes mellitus   . End stage renal disease ([place])    initiated HD 2006  . Hx of cardiovascular stress test    a. MV 03/2010:  no ischemia, EF 55%  . Hx of echocardiogram    a. Echo 03/2010: EF 55-60%, Gr 1 diast dysfn, trivial MS, mild to mod LAE, PASP 34, ;  b. Echo 11/13: mild LVH, EF 60%, Gr 1 diast dysfn, Ao sclerosis, no AS, MAC, slight MS, mean 3 mmHg, PASP 34  . Hyperlipidemia   . Hypertension   . Protein malnutrition ([place])   . Renal insufficiency   . Retinal detachment, bilateral   . Secondary hyperparathyroidism ([place])    Past Surgical History:  Past Surgical History:  Procedure Laterality Date  . ARTERIOVENOUS GRAFT PLACEMENT     BUE numerous times  . ARTERIOVENOUS GRAFT PLACEMENT  11/13/10   Lt femoral - Dr. Kellie Simmering  . CARDIAC CATHETERIZATION N/A 02/05/2015   Procedure: Left Heart Cath and Coronary Angiography;  Surgeon: Jettie Booze, MD;  Location: [facility];  Service: Cardiovascular;  Laterality: N/A;  . CATARACT EXTRACTION    . DG AV DIALYSIS GRAFT DECLOT OR  06/06/11, 08/26/11   performed in IR  . PERIPHERAL VASCULAR CATHETERIZATION N/A 02/06/2015   Procedure: A/V Shuntogram;  Surgeon: Elam Dutch, MD;  Location: [facility];  Service: Cardiovascular;  Laterality: N/A;  . PERIPHERAL VASCULAR CATHETERIZATION Left 02/26/2016   Procedure: A/V Shuntogram;  Surgeon: Waynetta Sandy, MD;  Location: [facility];  Service:  Cardiovascular;  Laterality: Left;  . PERIPHERAL VASCULAR CATHETERIZATION Left 02/26/2016   Procedure: Peripheral Vascular Balloon Angioplasty;  Surgeon: Waynetta Sandy, MD;  Location: [facility];  Service: Cardiovascular;  Laterality: Left;  Lt thigh avf  . SHUNTOGRAM N/A 05/29/2013   Procedure: Earney Mallet;  Surgeon: Conrad [place], MD;  Location: [facility] CATH LAB;  Service: Cardiovascular;  Laterality: N/A;  . SHUNTOGRAM Left 03/01/2014   Procedure: Earney Mallet;  Surgeon: Conrad , MD;  Location: [facility] CATH LAB;  Service: Cardiovascular;  Laterality: Left;  . TUBAL LIGATION     HPI:  Stefanie S Bernasconi is a 72 y.o. female with a history of ESRD, T2DM, HTN, CVA (1985, 1990, left sided weakness) who presented to the ED with intractable nausea and vomiting. Work up reassuring, though symptoms continued and pt unable to tolerate po so was brought in for observation. She reports her last BM was 5/13 and stool burden was noted on CT abdomen/pelvis. Bowel regimen was started including enemas with eventual resolution of constipation 5/18. She developed a cough, fever and infiltrates on CXR consistent with pneumonia. With concern for aspiration, unasyn started, SLP evaluation ordered. Per oral intake has not improved. Nursing informed SLP pt is taking medications without difficulty; pt denies dysphagia.  Assessment / Plan / Recommendation Clinical Impression   Pt presents with normal oropharyngeal swallow characterized by adequate timely swallow with all consistencies with pt  independently taking small sips/taking small bites and slow rate without SLP intervention during BSE; strong cough noted during OME and no overt s/s of aspiration noted throughout examination; pt informed SLP her appetite has improved along with no c/o nausea/vomiting at present time; recommend continue renal/carb modified diet with thin liquids; ST will s/o at this time as all education re: general swallowing precautions  completed during BSE; thank you for this consult. SLP Visit Diagnosis: Dysphagia, unspecified (R13.10)    Aspiration Risk  Mild aspiration risk    Diet Recommendation   Renal/carb modified with thin liquids  Medication Administration: Whole meds with liquid    Other  Recommendations Oral Care Recommendations: Oral care BID   Follow up Recommendations None      Frequency and Duration     n/a       Prognosis Prognosis for Safe Diet Advancement: Good      Swallow Study   General Date of Onset: 07/29/17 HPI: Stefanie S Haydon is a 72 y.o. female with a history of ESRD, T2DM, HTN, CVA (1985, 1990, left sided weakness) who presented to the ED with intractable nausea and vomiting. Work up reassuring, though symptoms continued and pt unable to tolerate po so was brought in for observation. She reports her last BM was 5/13 and stool burden was noted on CT abdomen/pelvis. Bowel regimen was started including enemas with eventual resolution of constipation 5/18. She developed a cough, fever and infiltrates on CXR consistent with pneumonia. With concern for aspiration, unasyn started, SLP evaluation ordered. Per oral intake has not improved.  Type of Study: Bedside Swallow Evaluation Diet Prior to this Study: Regular;Other (Comment)(renal/carb modified) Temperature Spikes Noted: No Respiratory Status: Room air History of Recent Intubation: No Behavior/Cognition: Alert;Cooperative Oral Cavity Assessment: Within Functional Limits Oral Care Completed by SLP: No Oral Cavity - Dentition: Dentures, top;Dentures, bottom Vision: Functional for self-feeding Self-Feeding Abilities: Able to feed self Patient Positioning: Upright in chair Baseline Vocal Quality: Low vocal intensity Volitional Cough: Strong Volitional Swallow: Able to elicit    Oral/Motor/Sensory Function Overall Oral Motor/Sensory Function: Within functional limits   Ice Chips Ice chips: Not tested   Thin Liquid Thin Liquid:  Within functional limits Presentation: Cup    Nectar Thick Nectar Thick Liquid: Not tested   Honey Thick Honey Thick Liquid: Not tested   Puree Puree: Within functional limits Presentation: Self Fed   Solid      Solid: Within functional limits Presentation: Self Fed        Elvina Sidle, M.S., CCC-SLP 08/02/2017,2:07 PM

## 2017-08-02 NOTE — Anesthesia Preprocedure Evaluation (Addendum)
Anesthesia Evaluation  Patient identified by MRN, date of birth, ID band Patient awake    Reviewed: Allergy & Precautions, NPO status , Patient's Chart, lab work & pertinent test results  Airway Mallampati: II  TM Distance: >3 FB Neck ROM: Full    Dental   Pulmonary former smoker,    breath sounds clear to auscultation       Cardiovascular hypertension, Pt. on medications + CAD, + Peripheral Vascular Disease, +CHF and + DVT   Rhythm:Regular Rate:Normal  '16 Cath - 1st Diag lesion, 100% stenosed. Mid RCA lesion, 100% stenosed. faint left to right collaterals are present. There is severe left ventricular systolic dysfunction, which is out of proportion to the degree of coronary artery disease. Component of nonischemic cardiomyopathy present  '16 TTE - LV cavity size was mildly dilated, mild LVH. EF of 30% to 35%. Diffuse hypokinesis. Grade 2 diastolic dysfunction. Trivial AI. Mild MR. Left atrium was severely dilated. RV cavity size was mildly dilated. RV systolic   function was moderately reduced. Right atrium was mildly dilated.  PA peak pressure: 55 mm Hg    Neuro/Psych CVA, Residual Symptoms negative psych ROS   GI/Hepatic negative GI ROS, Neg liver ROS,   Endo/Other  diabetes  Renal/GU ESRF and DialysisRenal disease  negative genitourinary   Musculoskeletal negative musculoskeletal ROS (+)   Abdominal   Peds  Hematology  (+) anemia , Thrombocytopenia   Anesthesia Other Findings   Reproductive/Obstetrics                             Anesthesia Physical Anesthesia Plan  ASA: IV  Anesthesia Plan: General   Post-op Pain Management:    Induction: Intravenous and Rapid sequence  PONV Risk Score and Plan: Treatment may vary due to age or medical condition and Ondansetron  Airway Management Planned: Oral ETT  Additional Equipment: None  Intra-op Plan:   Post-operative Plan: Extubation  in OR  Informed Consent: I have reviewed the patients History and Physical, chart, labs and discussed the procedure including the risks, benefits and alternatives for the proposed anesthesia with the patient or authorized representative who has indicated his/her understanding and acceptance.     Plan Discussed with: CRNA and Anesthesiologist  Anesthesia Plan Comments:        Anesthesia Quick Evaluation

## 2017-08-03 ENCOUNTER — Inpatient Hospital Stay (HOSPITAL_COMMUNITY): Payer: Medicare Other | Admitting: Anesthesiology

## 2017-08-03 ENCOUNTER — Encounter (HOSPITAL_COMMUNITY): Payer: Self-pay | Admitting: *Deleted

## 2017-08-03 ENCOUNTER — Encounter (HOSPITAL_COMMUNITY)
Admission: EM | Disposition: A | Discharge status: Home Health | Payer: Self-pay | Source: Skilled Nursing Facility | Attending: Family Medicine

## 2017-08-03 HISTORY — PX: ESOPHAGOGASTRODUODENOSCOPY: SHX5428

## 2017-08-03 HISTORY — PX: BRONCHIAL BRUSHINGS: SHX5108

## 2017-08-03 HISTORY — PX: BIOPSY: SHX5522

## 2017-08-03 LAB — GLUCOSE, CAPILLARY
GLUCOSE-CAPILLARY: 100 mg/dL — AB (ref 65–99)
GLUCOSE-CAPILLARY: 306 mg/dL — AB (ref 65–99)
GLUCOSE-CAPILLARY: 337 mg/dL — AB (ref 65–99)
Glucose-Capillary: 168 mg/dL — ABNORMAL HIGH (ref 65–99)

## 2017-08-03 LAB — POCT I-STAT 4, (NA,K, GLUC, HGB,HCT)
Glucose, Bld: 104 mg/dL — ABNORMAL HIGH (ref 65–99)
HCT: 27 % — ABNORMAL LOW (ref 36.0–46.0)
Hemoglobin: 9.2 g/dL — ABNORMAL LOW (ref 12.0–15.0)
Potassium: 4.8 mmol/L (ref 3.5–5.1)
SODIUM: 136 mmol/L (ref 135–145)

## 2017-08-03 SURGERY — EGD (ESOPHAGOGASTRODUODENOSCOPY)
Anesthesia: General

## 2017-08-03 MED ORDER — FENTANYL CITRATE (PF) 250 MCG/5ML IJ SOLN
INTRAMUSCULAR | Status: DC | PRN
  Administered 2017-08-03: 50 ug via INTRAVENOUS

## 2017-08-03 MED ORDER — PANTOPRAZOLE SODIUM 40 MG PO TBEC
40.0000 mg | DELAYED_RELEASE_TABLET | Freq: Two times a day (BID) | ORAL | Status: DC
  Administered 2017-08-03 – 2017-08-05 (×5): 40 mg via ORAL
  Filled 2017-08-03 (×5): qty 1

## 2017-08-03 MED ORDER — PROPOFOL 10 MG/ML IV BOLUS
INTRAVENOUS | Status: DC | PRN
  Administered 2017-08-03: 70 mg via INTRAVENOUS
  Administered 2017-08-03: 50 mg via INTRAVENOUS

## 2017-08-03 MED ORDER — SODIUM CHLORIDE 0.9 % IV SOLN
INTRAVENOUS | Status: DC
  Administered 2017-08-03 (×2): via INTRAVENOUS

## 2017-08-03 MED ORDER — PHENYLEPHRINE HCL 10 MG/ML IJ SOLN
INTRAMUSCULAR | Status: DC | PRN
  Administered 2017-08-03 (×3): 120 ug via INTRAVENOUS

## 2017-08-03 MED ORDER — FENTANYL CITRATE (PF) 100 MCG/2ML IJ SOLN
INTRAMUSCULAR | Status: AC
  Filled 2017-08-03: qty 2

## 2017-08-03 MED ORDER — LIDOCAINE HCL (CARDIAC) PF 100 MG/5ML IV SOSY
PREFILLED_SYRINGE | INTRAVENOUS | Status: DC | PRN
Start: 2017-08-03 — End: 2017-08-03
  Administered 2017-08-03: 80 mg via INTRAVENOUS

## 2017-08-03 MED ORDER — EPHEDRINE SULFATE 50 MG/ML IJ SOLN
INTRAMUSCULAR | Status: DC | PRN
  Administered 2017-08-03 (×2): 10 mg via INTRAVENOUS

## 2017-08-03 NOTE — Plan of Care (Signed)
  Problem: Activity: Goal: Risk for activity intolerance will decrease Outcome: Progressing   Problem: Nutrition: Goal: Adequate nutrition will be maintained Outcome: Progressing   

## 2017-08-03 NOTE — Op Note (Signed)
[facility] Patient Name: Stefanie [name] Procedure Date : 08/03/2017 MRN: [id number] Attending MD: Clarene Essex , MD Date of Birth: [birth year] CSN: [id number] Age: 72 Admit Type: Inpatient Procedure:                Upper GI endoscopy Indications:              Abnormal CT of the GI tract, Nausea with vomiting Providers:                Clarene Essex, MD, Cleda Daub, RN, Charolette Child,                            Technician, Tressia Miners, CRNA Referring MD:              Medicines:                See the Anesthesia note for documentation of the                            administered medications intubated but propofol used Complications:            No immediate complications. Estimated Blood Loss:     Estimated blood loss: none. Procedure:                Pre-Anesthesia Assessment:                           - Prior to the procedure, a History and Physical                            was performed, and patient medications and                            allergies were reviewed. The patient's tolerance of                            previous anesthesia was also reviewed. The risks                            and benefits of the procedure and the sedation                            options and risks were discussed with the patient.                            All questions were answered, and informed consent                            was obtained. Prior Anticoagulants: The patient has                            taken heparin, last dose was 1 day prior to                            procedure. ASA Grade Assessment: III - A patient  with severe systemic disease. After reviewing the                            risks and benefits, the patient was deemed in                            satisfactory condition to undergo the procedure.                           After obtaining informed consent, the endoscope was                            passed under direct vision.  Throughout the                            procedure, the patient's blood pressure, pulse, and                            oxygen saturations were monitored continuously. The                            EG-[id number] ([id number]) scope was introduced through the                            mouth, and advanced to the second part of duodenum.                            The upper GI endoscopy was accomplished without                            difficulty. The patient tolerated the procedure                            well. Scope In: Scope Out: Findings:      A medium-sized hiatal hernia was present.      Severe esophagitis with no bleeding was found. Biopsies were taken with       a cold forceps for histology. Cells for yeast were obtained by brushing.      The entire examined stomach was normal. 250 mL of fluid was suctioned      The duodenal bulb, first portion of the duodenum and second portion of       the duodenum were normal.      The exam was otherwise without abnormality. Impression:               - Medium-sized hiatal hernia.                           - Severe reflux esophagitis. Biopsied. Cells for                            yeast obtained.                           - Normal stomach.                           -  Normal duodenal bulb, first portion of the                            duodenum and second portion of the duodenum.                           - The examination was otherwise normal. Recommendation:           - Patient has a contact number available for                            emergencies. The signs and symptoms of potential                            delayed complications were discussed with the                            patient. Return to normal activities tomorrow.                            Written discharge instructions were provided to the                            patient.                           - Soft diet today. may resume previous renal diet                             soon and practice antireflux measures keeping head                            of bed elevated for at least 2-3 hours after eating                           - Use Protonix (pantoprazole) 40 mg PO BID for 1                            month. then once a day long-term                           - Return to GI clinic PRN. consider repeat                            endoscopy in 2-3 months to document healing                           - Telephone GI clinic for pathology results in 1                            week.                           - Telephone GI clinic if symptomatic PRN. Procedure Code(s):        ---  Professional ---                           [phone], Esophagogastroduodenoscopy, flexible,                            transoral; with biopsy, single or multiple Diagnosis Code(s):        --- Professional ---                           K44.9, Diaphragmatic hernia without obstruction or                            gangrene                           K21.0, Gastro-esophageal reflux disease with                            esophagitis                           R11.2, Nausea with vomiting, unspecified                           R93.3, Abnormal findings on diagnostic imaging of                            other parts of digestive tract CPT copyright 2017 [employer]. All rights reserved. The codes documented in this report are preliminary and upon coder review may  be revised to meet current compliance requirements. Clarene Essex, MD 08/03/2017 8:37:19 AM This report has been signed electronically. Number of Addenda: 0

## 2017-08-03 NOTE — Anesthesia Postprocedure Evaluation (Signed)
Anesthesia Post Note  Patient: Stefanie [name]  Procedure(s) Performed: ESOPHAGOGASTRODUODENOSCOPY (EGD) (N/A ) BIOPSY BRONCHIAL BRUSHINGS     Patient location during evaluation: PACU Anesthesia Type: General Level of consciousness: awake and alert Pain management: pain level controlled Vital Signs Assessment: post-procedure vital signs reviewed and stable Respiratory status: spontaneous breathing, nonlabored ventilation and respiratory function stable Cardiovascular status: blood pressure returned to baseline and stable Postop Assessment: no apparent nausea or vomiting Anesthetic complications: no    Last Vitals:  Vitals:   08/03/17 0930 08/03/17 1001  BP: (!) 101/33 (!) 100/55  Pulse: 79 76  Resp: (!) 21 18  Temp:  36.8 C  SpO2: 92% 97%    Last Pain:  Vitals:   08/03/17 1001  TempSrc: Oral  PainSc:                  Audry Pili

## 2017-08-03 NOTE — Progress Notes (Signed)
Physical Therapy Treatment Patient Details Name: Stefanie [name] MRN: [id number] DOB: [birth year] Today's Date: 08/03/2017    History of Present Illness Pt is a 72 y.o. female with medical history significant of end-stage renal disease on hemodialysis on Tuesdays, Thursdays and Saturdays who presented with 2 days history of nausea, vomiting and epigastric pain.  PMH consists of DM, CVA and CAD.     PT Comments    Pt with unstable HR while working with PT. Performed sit to stand with min A. Afterwards HR fluctuating as much as 130 bpm to 44 bpm and pt breathing very heavily and less responsive, reports that she feels that her heart is racing and she is very fatigued . O2 sats stable throughout. Seated LE there ex had the same effects. RN notified and present. Changing d/c rec to SNF for increased supervision at d/c. PT will continue to follow.   Follow Up Recommendations  Supervision for mobility/OOB;SNF     Equipment Recommendations  None recommended by PT    Recommendations for Other Services       Precautions / Restrictions Precautions Precautions: Fall Restrictions Weight Bearing Restrictions: No    Mobility  Bed Mobility               General bed mobility comments: pt in chair  Transfers Overall transfer level: Needs assistance Equipment used: Rolling walker (2 wheeled) Transfers: Sit to/from Stand Sit to Stand: Min assist         General transfer comment: needed min A for support during transfer of hands from chair to RW  Ambulation/Gait             General Gait Details: pt unable to step feet today, R knee genu valgus noted. Pt reports that she could take a few steps to bathroom PTA? Different info from last note   Stairs             Wheelchair Mobility    Modified Rankin (Stroke Patients Only)       Balance Overall balance assessment: Needs assistance Sitting-balance support: No upper extremity supported;Feet supported Sitting  balance-Leahy Scale: Fair     Standing balance support: Bilateral upper extremity supported;During functional activity Standing balance-Leahy Scale: Poor Standing balance comment: heavy reliance on UE support                            Cognition Arousal/Alertness: Awake/alert Behavior During Therapy: WFL for tasks assessed/performed Overall Cognitive Status: Within Functional Limits for tasks assessed                                 General Comments: pt WFL at beginning of session, HR erratic with exertion and pt less responsive through that time, see notes below      Exercises General Exercises - Lower Extremity Ankle Circles/Pumps: AROM;Both;20 reps;Seated Long Arc Quad: AROM;Both;10 reps;Seated Heel Slides: AROM;Both;10 reps;Seated Straight Leg Raises: AROM;Both;10 reps;Seated Hip Flexion/Marching: AROM;Both;10 reps;Standing    General Comments General comments (skin integrity, edema, etc.): planned to practice transfers several times but after first one pt's HR fluctiating from 130's to 44 bpm. She then began to breathe very heavily, was less responsive and reported she could feel her heart racing. RN called and present. Performed seated exercises and they had the same affect.      Pertinent Vitals/Pain Pain Assessment: No/denies pain    Home Living  Prior Function            PT Goals (current goals can now be found in the care plan section) Acute Rehab PT Goals Patient Stated Goal: get an electric w/c PT Goal Formulation: With patient Time For Goal Achievement: 08/15/17 Potential to Achieve Goals: Good Progress towards PT goals: Not progressing toward goals - comment(unstable HR)    Frequency    Min 3X/week      PT Plan Discharge plan needs to be updated    Co-evaluation              AM-PAC PT "6 Clicks" Daily Activity  Outcome Measure  Difficulty turning over in bed (including adjusting  bedclothes, sheets and blankets)?: A Lot Difficulty moving from lying on back to sitting on the side of the bed? : A Lot Difficulty sitting down on and standing up from a chair with arms (e.g., wheelchair, bedside commode, etc,.)?: Unable Help needed moving to and from a bed to chair (including a wheelchair)?: A Lot Help needed walking in hospital room?: Total Help needed climbing 3-5 steps with a railing? : Total 6 Click Score: 9    End of Session Equipment Utilized During Treatment: Gait belt Activity Tolerance: Treatment limited secondary to medical complications (Comment)(see generl comments) Patient left: in chair;with call bell/phone within reach;with nursing/sitter in room Nurse Communication: Mobility status;Other (comment)(HR) PT Visit Diagnosis: Other abnormalities of gait and mobility (R26.89);Muscle weakness (generalized) (M62.81)     Time: 1447-1510 PT Time Calculation (min) (ACUTE ONLY): 23 min  Charges:  $Therapeutic Exercise: 8-22 mins $Therapeutic Activity: 8-22 mins                    G Codes:       [place] 08/03/2017, 4:59 PM

## 2017-08-03 NOTE — Progress Notes (Signed)
Stefanie [name] 7:48 AM  Subjective: Patient was some nausea and spitting up this morning and no abdominal pain  Objective: Vital signs stable afebrile no acute distress exam please see preassessment evaluation  Assessment: Abnormal CT scan periodic nausea vomiting  Plan: Okay to proceed with endoscopy with anesthesia assistance  [facility]  Pager [phone] After 5PM or if no answer call [phone]

## 2017-08-03 NOTE — Anesthesia Procedure Notes (Signed)
Procedure Name: Intubation Date/Time: 08/03/2017 8:05 AM Performed by: Glynda Jaeger, CRNA Pre-anesthesia Checklist: Patient identified, Patient being monitored, Timeout performed, Emergency Drugs available and Suction available Patient Re-evaluated:Patient Re-evaluated prior to induction Oxygen Delivery Method: Circle System Utilized Preoxygenation: Pre-oxygenation with 100% oxygen Induction Type: IV induction Ventilation: Mask ventilation without difficulty Laryngoscope Size: Mac, 3 and 4 Grade View: Grade I Tube type: Oral Tube size: 7.5 mm Number of attempts: 1 Airway Equipment and Method: Stylet Placement Confirmation: ETT inserted through vocal cords under direct vision,  positive ETCO2 and breath sounds checked- equal and bilateral Secured at: 21 cm Tube secured with: Tape Dental Injury: Teeth and Oropharynx as per pre-operative assessment

## 2017-08-03 NOTE — Progress Notes (Signed)
Subjective:  For hd today , no cos  ,  states  tolerated some  soft diet after EGD  Objective Vital signs in last 24 hours: Vitals:   08/03/17 0925 08/03/17 0930 08/03/17 1001 08/03/17 1221  BP: (!) 106/31 (!) 101/33 (!) 100/55 (!) 111/53  Pulse: 81 79 76 79  Resp: (!) 21 (!) 21 18 18   Temp:   98.3 F (36.8 C) 98.5 F (36.9 C)  TempSrc:   Oral Oral  SpO2: 97% 92% 97% 100%  Weight:      Height:       Weight change: 0.5 kg (1 lb 1.6 oz)    Physical Exam: General: Chronically ill appearing AAF  , sleeping in Bedside chair  Awoken NAD  H eart: RRR no M,. R, G Lungs: Bilat Crackles / non labored breathing  Abdomen:  BS pos. , soft , NT, ND  Extremities: no pedal edema Dialysis Access: L Fem . AVGG  Pos bruit   OP Dialysis Orders:  HD orders TTS [facility] 4 hr 2K 2 Ca 450/800 left thigh AVGG  EDW 74.5 heparin 3000 sensipar 180 with HD, Micera 150 q 2 weeks 5/9 calcitriol 1.75 Runs full treatments - gets to edw most of the time gains 1-3 L-  Problem/Plan:  1. ESRD - For HD today  , check labs pre / with istat  k 4.8 pre egd  2. Abdominal pain / N/V  multifocal = Constipation (now resolved)                                                           Hiatal hernia ,RefluxEsopahgitius  3. Aspiration  Pneumonia-  Admit team rx =on Unasyn /   4. Anemia - hgb 9.2<9.3  Next ESA  08/05/17  5. Secondary hyperparathyroidism -  Calcitriol on hd / tomor phoslo as binder  pending labs today / and Sensipar 180 with hd restart Thursday hd / 6. HTN/volume -noted Lisinopril held with lowish bp / at edw per wts  uf only 1 liter today as tolerated  7. HO CAD/ CHF - stable uf as above   Ernest Haber, PA-C [facility] [phone] 08/03/2017,2:47 PM  LOS: 3 days   Labs: Basic Metabolic Panel: Recent Labs  Lab 07/29/17 1708 07/30/17 0237 08/01/17 0557 08/03/17 0747  NA 140 140 136 136  K 3.7 3.8 3.7 4.8  CL 87* 90* 89*  --   CO2 35* 31 30  --   GLUCOSE 175* 313* 60* 104*   BUN 5* 15 43*  --   CREATININE 4.23* 5.42* 8.59*  --   CALCIUM 10.8* 9.7 9.6  --   PHOS  --   --  4.0  --    Liver Function Tests: Recent Labs  Lab 07/29/17 1708 07/30/17 0237 08/01/17 0557  AST 38 27  --   ALT 20 17  --   ALKPHOS 98 84  --   BILITOT 0.9 1.2  --   PROT 8.4* 7.2  --   ALBUMIN 4.5 3.8 3.0*   Recent Labs  Lab 07/29/17 1708  LIPASE 26   No results for input(s): AMMONIA in the last 168 hours. CBC: Recent Labs  Lab 07/29/17 1708 07/30/17 0237 08/01/17 0557 08/02/17 0339 08/03/17 0747  WBC 6.3 10.3 11.4* 8.4  --  NEUTROABS  --  8.9*  --   --   --   HGB 11.7* 11.3* 10.1* 9.5* 9.2*  HCT 38.1 37.0 32.2* 30.9* 27.0*  MCV 101.6* 104.5* 100.6* 102.7*  --   PLT 140* 133* 135* 108*  --    Cardiac Enzymes: No results for input(s): CKTOTAL, CKMB, CKMBINDEX, TROPONINI in the last 168 hours. CBG: Recent Labs  Lab 08/02/17 1158 08/02/17 1708 08/02/17 2034 08/03/17 0723 08/03/17 1211  GLUCAP 197* 277* 248* 100* 168*    Studies/Results: No results found. Medications: . ampicillin-sulbactam (UNASYN) IV Stopped (08/03/17 0550)   . acetaminophen  650 mg Oral BID  . calcitRIOL  1.75 mcg Oral Q T,Th,Sa-HD  . calcium acetate  2,001 mg Oral TID WC  . cinacalcet  30 mg Oral Q T,Th,Sa-HD  . [START ON 08/05/2017] darbepoetin (ARANESP) injection - DIALYSIS  100 mcg Intravenous Q Thu-HD  . feeding supplement (NEPRO CARB STEADY)  237 mL Oral TID WC  . heparin  5,000 Units Subcutaneous Q8H  . insulin aspart  0-5 Units Subcutaneous QHS  . insulin aspart  0-9 Units Subcutaneous TID WC  . latanoprost  1 drop Left Eye QHS  . lidocaine-prilocaine  1 application Topical Q T,Th,Sa-HD  . loratadine  10 mg Oral Daily  . Melatonin  4.5 mg Oral QHS  . metoCLOPramide  5 mg Oral TID AC  . multivitamin  1 tablet Oral QHS  . neomycin-polymyxin b-dexamethasone  1 drop Right Eye BID  . oxyCODONE  5 mg Oral BID  . pantoprazole  40 mg Oral BID  . polyethylene glycol  17 g Oral  BID  . pravastatin  20 mg Oral QHS  . rOPINIRole  1 mg Oral QHS  . senna-docusate  2 tablet Oral QHS  . sorbitol  30 mL Oral Daily

## 2017-08-03 NOTE — Progress Notes (Addendum)
PROGRESS NOTE  Subjective: Stefanie [name] is a 72 y.o. female with a history of ESRD, T2DM, HTN who presented to the ED with intractable nausea and vomiting. Work up reassuring, though symptoms continued and pt unable to tolerate po so was brought in for observation. She reports her last BM was 5/13 and stool burden was noted on CT abdomen/pelvis. CT also demonstrated dilated esophagus and upper normal size stomach without obstruction. Bowel regimen was started including enemas with eventual resolution of constipation 5/18. She developed a cough, fever and infiltrates on CXR consistent with pneumonia 5/19. With concern for aspiration, unasyn started, SLP evaluation ordered. Per oral intake did not improve, with ongoing emesis. GI consulted and EGD 5/21 found severe reflux esophagitis, biopsy taken. PPI to be continued.   Subjective: Throwing up watery fluid through yesterday, has been NPO today. Cough improving, denies fever or dyspnea.   Objective: BP (!) 111/53 (BP Location: Right Arm)   Pulse 79   Temp 98.5 F (36.9 C) (Oral)   Resp 18   Ht 5\' 4"  (1.626 m)   Wt 74.8 kg (164 lb 14.5 oz)   SpO2 100%   BMI 28.31 kg/m   Gen: Pleasant, chronically ill-appearing female sitting in chair in no distress HEENT: MMM, edentulous Pulm: Upper airway sounds transmitted, diminished w/rhonchi at left base CV: RRR, no murmur, no JVD, no edema GI: Soft, not tender or distended Skin: No ulcers. Left thigh AVG without erythema. Neuro: Alert, oriented, no focal deficits Psych: Euthymic mood, congruent affect, appropriate behavior.  Procedures:  EGD by Dr. Watt Climes 08/03/2017:      A medium-sized hiatal hernia was present.      Severe esophagitis with no bleeding was found. Biopsies were taken with       a cold forceps for histology. Cells for yeast were obtained by brushing.      The entire examined stomach was normal. 250 mL of fluid was suctioned      The duodenal bulb, first portion of the duodenum  and second portion of       the duodenum were normal.      The exam was otherwise without abnormality. Impression:       - Medium-sized hiatal hernia.                           - Severe reflux esophagitis. Biopsied. Cells for                            yeast obtained.                           - Normal stomach.                           - Normal duodenal bulb, first portion of the                            duodenum and second portion of the duodenum.                           - The examination was otherwise normal. Recommendation: - Patient has a contact number available for  emergencies. The signs and symptoms of potential                            delayed complications were discussed with the                            patient. Return to normal activities tomorrow.                            Written discharge instructions were provided to the                            patient.                           - Soft diet today. may resume previous renal diet                            soon and practice antireflux measures keeping head                            of bed elevated for at least 2-3 hours after eating                           - Use Protonix (pantoprazole) 40 mg PO BID for 1                            month. then once a day long-term                           - Return to GI clinic PRN. consider repeat                            endoscopy in 2-3 months to document healing                           - Telephone GI clinic for pathology results in 1                            week.     Assessment & Plan: Abdominal pain, intractable nausea and vomiting: Due to constipation, hiatal hernia with severe reflux esophagitis.  - Treat as below  - Continuing empiric reglan for possible gastroparesis, consider gastric emptying study.   Constipation: Now resolved. - Continue preventive regimen.  Severe reflux esophagitis:  - PPI BID x1 month, then daily -  Follow up brushings for yeast and biopsies - Per GI, consider repeat endoscopy in 2-3 months to document healing  Aspiration pneumonia: Not present on arrival. Fever, LLL infiltrate, cough, WBC 11.4.Note MRSA PCR negative. - Continue unasyn 5/19 >>. Defervesced and WBC normalized.  - Monitor sputum and repeat blood cultures.   - SLP evaluation shows mild aspiration risk. No change to diet. ?SIlent aspiration from severe reflux.  Lactic acid elevation: Without acidosis. Uncertain clinical significance with ESRD. - Monitor blood cultures and fever curve  ESRD:  - Continue home medications. - HD  per nephrology 5/19.  T2DM with hyperglycemia: HbA1c 7.7% - Cover with renal SSI qAC and HS. Has not required insulin due to not taking anything by mouth.  CAD and chronic combined CHF: No anginal complaints. - Appears compensated, given 1L NS in ED.  - Volume management with HD UF. If continues to take so little po, may need gentle fluids, defer to nephrology. - Not on ASA for unclear reasons, possibly due to bleeding complications in the past. Will need cardiology follow up.  - Continue statin.   HTN:  - BPs soft, so holding low dose lisinopril  Disposition: Discharge back to [facility] once tolerating po.  Patrecia Pour, MD [facility] 08/03/2017, 2:11 PM

## 2017-08-03 NOTE — Transfer of Care (Signed)
Immediate Anesthesia Transfer of Care Note  Patient: Stefanie [name]  Procedure(s) Performed: ESOPHAGOGASTRODUODENOSCOPY (EGD) (N/A )  Patient Location: Endoscopy Unit  Anesthesia Type:General  Level of Consciousness: awake, alert , patient cooperative and responds to stimulation  Airway & Oxygen Therapy: Patient Spontanous Breathing and Patient connected to face mask oxygen  Post-op Assessment: Report given to RN and Post -op Vital signs reviewed and stable  Post vital signs: Reviewed and stable  Last Vitals:  Vitals Value Taken Time  BP    Temp    Pulse 86 08/03/2017  8:37 AM  Resp 24 08/03/2017  8:37 AM  SpO2 100 % 08/03/2017  8:37 AM  Vitals shown include unvalidated device data.  Last Pain:  Vitals:   08/03/17 0716  TempSrc: Oral  PainSc: 0-No pain         Complications: No apparent anesthesia complications

## 2017-08-04 ENCOUNTER — Encounter (HOSPITAL_COMMUNITY): Payer: Self-pay | Admitting: Gastroenterology

## 2017-08-04 LAB — CBC
HCT: 27.8 % — ABNORMAL LOW (ref 36.0–46.0)
Hemoglobin: 8.3 g/dL — ABNORMAL LOW (ref 12.0–15.0)
MCH: 31.3 pg (ref 26.0–34.0)
MCHC: 29.9 g/dL — ABNORMAL LOW (ref 30.0–36.0)
MCV: 104.9 fL — ABNORMAL HIGH (ref 78.0–100.0)
Platelets: 117 10*3/uL — ABNORMAL LOW (ref 150–400)
RBC: 2.65 MIL/uL — ABNORMAL LOW (ref 3.87–5.11)
RDW: 17.5 % — ABNORMAL HIGH (ref 11.5–15.5)
WBC: 5.6 10*3/uL (ref 4.0–10.5)

## 2017-08-04 LAB — RENAL FUNCTION PANEL
Albumin: 2.6 g/dL — ABNORMAL LOW (ref 3.5–5.0)
Anion gap: 13 (ref 5–15)
BUN: 45 mg/dL — ABNORMAL HIGH (ref 6–20)
CO2: 30 mmol/L (ref 22–32)
Calcium: 9.6 mg/dL (ref 8.9–10.3)
Chloride: 94 mmol/L — ABNORMAL LOW (ref 101–111)
Creatinine, Ser: 8.08 mg/dL — ABNORMAL HIGH (ref 0.44–1.00)
GFR calc Af Amer: 5 mL/min — ABNORMAL LOW (ref >60)
GFR calc non Af Amer: 4 mL/min — ABNORMAL LOW (ref >60)
Glucose, Bld: 230 mg/dL — ABNORMAL HIGH (ref 65–99)
Phosphorus: 4.2 mg/dL (ref 2.5–4.6)
Potassium: 4.7 mmol/L (ref 3.5–5.1)
Sodium: 137 mmol/L (ref 135–145)

## 2017-08-04 LAB — CULTURE, BLOOD (ROUTINE X 2)
Culture: NO GROWTH
Culture: NO GROWTH
SPECIAL REQUESTS: ADEQUATE

## 2017-08-04 LAB — GLUCOSE, CAPILLARY
GLUCOSE-CAPILLARY: 224 mg/dL — AB (ref 65–99)
GLUCOSE-CAPILLARY: 147 mg/dL — AB (ref 65–99)
Glucose-Capillary: 321 mg/dL — ABNORMAL HIGH (ref 65–99)
Glucose-Capillary: 317 mg/dL — ABNORMAL HIGH (ref 65–99)

## 2017-08-04 MED ORDER — LIDOCAINE-PRILOCAINE 2.5-2.5 % EX CREA: 1.0000 "application " | TOPICAL_CREAM | CUTANEOUS | Status: DC | PRN

## 2017-08-04 MED ORDER — SODIUM CHLORIDE 0.9 % IV SOLN: 100.0000 mL | INTRAVENOUS | Status: DC | PRN

## 2017-08-04 MED ORDER — ALTEPLASE 2 MG IJ SOLR: 2.0000 mg | Freq: Once | INTRAMUSCULAR | Status: DC | PRN

## 2017-08-04 MED ORDER — PENTAFLUOROPROP-TETRAFLUOROETH EX AERO: 1.0000 "application " | INHALATION_SPRAY | CUTANEOUS | Status: DC | PRN

## 2017-08-04 MED ORDER — OXYCODONE HCL 5 MG PO TABS
ORAL_TABLET | ORAL | Status: AC
  Filled 2017-08-04: qty 1

## 2017-08-04 MED ORDER — HEPARIN SODIUM (PORCINE) 1000 UNIT/ML DIALYSIS: 1000.0000 [IU] | INTRAMUSCULAR | Status: DC | PRN

## 2017-08-04 MED ORDER — HEPARIN SODIUM (PORCINE) 1000 UNIT/ML DIALYSIS: 20.0000 [IU]/kg | INTRAMUSCULAR | Status: DC | PRN

## 2017-08-04 MED ORDER — LIDOCAINE HCL (PF) 1 % IJ SOLN: 5.0000 mL | INTRAMUSCULAR | Status: DC | PRN

## 2017-08-04 NOTE — Progress Notes (Signed)
Dr. Hollie Salk rescheduled HD tx for 5/21 to 5/22, orders modified in Epic, floor notified

## 2017-08-04 NOTE — Progress Notes (Signed)
Subjective:  Noted  Hd done today  Secondary to emergent cases yesterday/ swallowing  better  wanting some Lunch  Now   Objective Vital signs in last 24 hours: Vitals:   08/04/17 1030 08/04/17 1100 08/04/17 1127 08/04/17 1219  BP: (!) 111/53 (!) 108/36 (!) 103/55 111/77  Pulse: 82 63 93 96  Resp:   18 17  Temp:   97.7 F (36.5 C) 99.1 F (37.3 C)  TempSrc:   Oral Oral  SpO2:   99% 96%  Weight:   74.4 kg (164 lb 0.4 oz)   Height:       Weight change: 0.001 kg ()  Physical Exam: General: Chronically ill appearing AAF  H eart: RRR no M,. R, G Lungs: Bilat Crackles / non labored breathing  Abdomen:  BS pos. , soft , NT, ND  Extremities: no pedal edema Dialysis Access: L Fem . AVGG  Pos bruit   OP Dialysis Orders: HD orders TTS [facility] 4 hr 2K 2 Ca 450/800left thigh AVGGEDW 74.5 heparin 3000 sensipar 180 with HD, Micera 150 q 2 weeks 5/9 calcitriol 1.75 Runs full treatments - gets to edw most of the time gains 1-3 L-  Problem/Plan:  1. ESRD - HD today off schedule sec. to Multiple emergent cases yest .4.7 k , next HD tomr shot term  If able  Follow up labs in am  2. Abdominal pain / N/V  multifocal = Constipation (now resolved)         and Hiatal hernia / SEVERERefluxEsopahgitius on EGD  Dr Watt Climes ( GI  RX )  3. Aspiration  Pneumonia-  Admit team rx =on Unasyn /   4. Anemia - hgb 8.3<9.2<9.3  Next ESA  08/05/17  100 mcg  5. Secondary hyperparathyroidism -  Calcitriol on hd / tomor phoslo as binder  Phos 4.2  And ca > 10 corrected will hold Calcitriol for now / and Sensipar 180 with hd restart Thursday hd with NV resolved / 6. HTN/volume -noted Lisinopril held with lowish bp / at edw per wts  uf only 1 liter today as tolerated  7. HO CAD/ CHF - stable uf as above   Ernest Haber, PA-C [facility] [phone] 08/04/2017,2:03 PM  LOS: 4 days   Labs: Basic Metabolic Panel: Recent Labs  Lab 07/30/17 0237 08/01/17 0557 08/03/17 0747 08/04/17 0630  NA  140 136 136 137  K 3.8 3.7 4.8 4.7  CL 90* 89*  --  94*  CO2 31 30  --  30  GLUCOSE 313* 60* 104* 230*  BUN 15 43*  --  45*  CREATININE 5.42* 8.59*  --  8.08*  CALCIUM 9.7 9.6  --  9.6  PHOS  --  4.0  --  4.2   Liver Function Tests: Recent Labs  Lab 07/29/17 1708 07/30/17 0237 08/01/17 0557 08/04/17 0630  AST 38 27  --   --   ALT 20 17  --   --   ALKPHOS 98 84  --   --   BILITOT 0.9 1.2  --   --   PROT 8.4* 7.2  --   --   ALBUMIN 4.5 3.8 3.0* 2.6*   Recent Labs  Lab 07/29/17 1708  LIPASE 26   No results for input(s): AMMONIA in the last 168 hours. CBC: Recent Labs  Lab 07/29/17 1708 07/30/17 0237 08/01/17 0557 08/02/17 0339 08/03/17 0747 08/04/17 0630  WBC 6.3 10.3 11.4* 8.4  --  5.6  NEUTROABS  --  8.9*  --   --   --   --   HGB 11.7* 11.3* 10.1* 9.5* 9.2* 8.3*  HCT 38.1 37.0 32.2* 30.9* 27.0* 27.8*  MCV 101.6* 104.5* 100.6* 102.7*  --  104.9*  PLT 140* 133* 135* 108*  --  117*   Cardiac Enzymes: No results for input(s): CKTOTAL, CKMB, CKMBINDEX, TROPONINI in the last 168 hours. CBG: Recent Labs  Lab 08/03/17 1211 08/03/17 1707 08/03/17 2132 08/04/17 0628 08/04/17 1211  GLUCAP 168* 306* 337* 224* 147*    Studies/Results: No results found. Medications: . ampicillin-sulbactam (UNASYN) IV Stopped (08/04/17 0450)   . acetaminophen  650 mg Oral BID  . calcitRIOL  1.75 mcg Oral Q T,Th,Sa-HD  . calcium acetate  2,001 mg Oral TID WC  . cinacalcet  30 mg Oral Q T,Th,Sa-HD  . [START ON 08/05/2017] darbepoetin (ARANESP) injection - DIALYSIS  100 mcg Intravenous Q Thu-HD  . feeding supplement (NEPRO CARB STEADY)  237 mL Oral TID WC  . heparin  5,000 Units Subcutaneous Q8H  . insulin aspart  0-5 Units Subcutaneous QHS  . insulin aspart  0-9 Units Subcutaneous TID WC  . latanoprost  1 drop Left Eye QHS  . lidocaine-prilocaine  1 application Topical Q T,Th,Sa-HD  . loratadine  10 mg Oral Daily  . Melatonin  4.5 mg Oral QHS  . metoCLOPramide  5 mg Oral TID  AC  . multivitamin  1 tablet Oral QHS  . neomycin-polymyxin b-dexamethasone  1 drop Right Eye BID  . oxyCODONE      . oxyCODONE  5 mg Oral BID  . pantoprazole  40 mg Oral BID  . polyethylene glycol  17 g Oral BID  . pravastatin  20 mg Oral QHS  . rOPINIRole  1 mg Oral QHS  . senna-docusate  2 tablet Oral QHS  . sorbitol  30 mL Oral Daily

## 2017-08-04 NOTE — Progress Notes (Signed)
PROGRESS NOTE    Stefanie [name]  QBH:[id number] DOB: [birth year] DOA: 07/29/2017 PCP: Raelyn Number, MD    Brief Narrative:   Stefanie [name] is a 72 y.o. female with a history of ESRD, T2DM, HTN who presented to the ED with intractable nausea and vomiting. Work up reassuring, though symptoms continued and pt unable to tolerate po so was brought in for observation. She reports her last BM was 5/13 and stool burden was noted on CT abdomen/pelvis. CT also demonstrated dilated esophagus and upper normal size stomach without obstruction. Bowel regimen was started including enemas with eventual resolution of constipation 5/18. She developed a cough, fever and infiltrates on CXR consistent with pneumonia 5/19. With concern for aspiration, unasyn started, SLP evaluation ordered. Per oral intake did not improve, with ongoing emesis. GI consulted and EGD 5/21 found severe reflux esophagitis, biopsy taken. PPI to be continued.     Assessment & Plan:   Principal Problem:   Nausea & vomiting Active Problems:   Essential hypertension   ESRD on dialysis (HCC)   Type 2 diabetes, controlled, with neuropathy (HCC)   Congestive dilated cardiomyopathy (HCC)   Weakness generalized   Severe abd pain, with anusea, and vomiting: possibly from severe reflux esophagitis GI consulted and s/p EGD showing severe reflux esophagitis.  - resume PPI.  - await biopsy results.    Aspiration pneumonia:  Improving.  SLP evaluation completed.  Silent aspiration possibly from reflux.  Complete the course of antibiotics.  Afebrile and leukocytosis resolved.     ESRD on HD.  Further management as per renal.    CAD  No chest pain.    Hypertension well controlled.    Type 2 DM, uncontrolled with hyperglycemia>  hgba1c is 7.7.  Restarted SSI.  CBG (last 3)  Recent Labs    08/03/17 2132 08/04/17 0628 08/04/17 1211  GLUCAP 337* 224* 147*     H/O chronic systolic and diastolic CHF She appears  to be compensated.     DVT prophylaxis: scd's Code Status: full code.  Family Communication: NONE AT BEDSIDE.  Disposition Plan: possibly to SNF IN AM.   Consultants:  [facility] Dr :Carolin Sicks.    Procedures: EGD showing severe reflux esophagitis biopsied.   Antimicrobials: unasyn.   Subjective: No chest pain or sob. Slightly nauseated, no vomiting today.   Objective: Vitals:   08/04/17 1100 08/04/17 1127 08/04/17 1219 08/04/17 1442  BP: (!) 108/36 (!) 103/55 111/77 (!) 90/45  Pulse: 63 93 96 72  Resp:  18 17 16   Temp:  97.7 F (36.5 C) 99.1 F (37.3 C) 98.8 F (37.1 C)  TempSrc:  Oral Oral Oral  SpO2:  99% 96% 96%  Weight:  74.4 kg (164 lb 0.4 oz)    Height:        Intake/Output Summary (Last 24 hours) at 08/04/2017 1513 Last data filed at 08/04/2017 1127 Gross per 24 hour  Intake 180 ml  Output 595 ml  Net -415 ml   Filed Weights   08/04/17 0507 08/04/17 0714 08/04/17 1127  Weight: 76.2 kg (167 lb 15.9 oz) 74.9 kg (165 lb 2 oz) 74.4 kg (164 lb 0.4 oz)    Examination:  General exam: Appears calm and comfortable  Respiratory system: Clear to auscultation. Respiratory effort normal. Cardiovascular system: S1 & S2 heard, RRR. No JVD, murmurs, rubs, gallops or clicks. No pedal edema. Gastrointestinal system: Abdomen is nondistended, soft and nontender. No organomegaly or masses felt. Normal  bowel sounds heard. Central nervous system: Alert and oriented. No focal neurological deficits. Extremities: Symmetric 5 x 5 power. Skin: No rashes, lesions or ulcers Psychiatry: Judgement and insight appear normal. Mood & affect appropriate.     Data Reviewed: I have personally reviewed following labs and imaging studies  CBC: Recent Labs  Lab 07/29/17 1708 07/30/17 0237 08/01/17 0557 08/02/17 0339 08/03/17 0747 08/04/17 0630  WBC 6.3 10.3 11.4* 8.4  --  5.6  NEUTROABS  --  8.9*  --   --   --   --   HGB 11.7* 11.3* 10.1* 9.5* 9.2*  8.3*  HCT 38.1 37.0 32.2* 30.9* 27.0* 27.8*  MCV 101.6* 104.5* 100.6* 102.7*  --  104.9*  PLT 140* 133* 135* 108*  --  235*   Basic Metabolic Panel: Recent Labs  Lab 07/29/17 1708 07/30/17 0237 08/01/17 0557 08/03/17 0747 08/04/17 0630  NA 140 140 136 136 137  K 3.7 3.8 3.7 4.8 4.7  CL 87* 90* 89*  --  94*  CO2 35* 31 30  --  30  GLUCOSE 175* 313* 60* 104* 230*  BUN 5* 15 43*  --  45*  CREATININE 4.23* 5.42* 8.59*  --  8.08*  CALCIUM 10.8* 9.7 9.6  --  9.6  PHOS  --   --  4.0  --  4.2   GFR: Estimated Creatinine Clearance: 6.3 mL/min (A) (by C-G formula based on SCr of 8.08 mg/dL (H)). Liver Function Tests: Recent Labs  Lab 07/29/17 1708 07/30/17 0237 08/01/17 0557 08/04/17 0630  AST 38 27  --   --   ALT 20 17  --   --   ALKPHOS 98 84  --   --   BILITOT 0.9 1.2  --   --   PROT 8.4* 7.2  --   --   ALBUMIN 4.5 3.8 3.0* 2.6*   Recent Labs  Lab 07/29/17 1708  LIPASE 26   No results for input(s): AMMONIA in the last 168 hours. Coagulation Profile: No results for input(s): INR, PROTIME in the last 168 hours. Cardiac Enzymes: No results for input(s): CKTOTAL, CKMB, CKMBINDEX, TROPONINI in the last 168 hours. BNP (last 3 results) No results for input(s): PROBNP in the last 8760 hours. HbA1C: No results for input(s): HGBA1C in the last 72 hours. CBG: Recent Labs  Lab 08/03/17 1211 08/03/17 1707 08/03/17 2132 08/04/17 0628 08/04/17 1211  GLUCAP 168* 306* 337* 224* 147*   Lipid Profile: No results for input(s): CHOL, HDL, LDLCALC, TRIG, CHOLHDL, LDLDIRECT in the last 72 hours. Thyroid Function Tests: No results for input(s): TSH, T4TOTAL, FREET4, T3FREE, THYROIDAB in the last 72 hours. Anemia Panel: No results for input(s): VITAMINB12, FOLATE, FERRITIN, TIBC, IRON, RETICCTPCT in the last 72 hours. Sepsis Labs: Recent Labs  Lab 07/29/17 2000 07/29/17 2255  LATICACIDVEN 2.38* 2.40*    Recent Results (from the past 240 hour(s))  Blood culture (routine x  2)     Status: None   Collection Time: 07/29/17 10:40 PM  Result Value Ref Range Status   Specimen Description BLOOD RIGHT HAND  Final   Special Requests   Final    BOTTLES DRAWN AEROBIC AND ANAEROBIC Blood Culture results may not be optimal due to an inadequate volume of blood received in culture bottles   Culture   Final    NO GROWTH 5 DAYS Performed at [facility], [place] [address] [place], [place] [address]    Report Status 08/04/2017 FINAL  Final  Blood culture (routine x 2)     Status: None   Collection Time: 07/29/17 11:10 PM  Result Value Ref Range Status   Specimen Description BLOOD LEFT HAND  Final   Special Requests   Final    BOTTLES DRAWN AEROBIC AND ANAEROBIC Blood Culture adequate volume   Culture   Final    NO GROWTH 5 DAYS Performed at [facility], [address], [place], [place] [address]    Report Status 08/04/2017 FINAL  Final  MRSA PCR Screening     Status: None   Collection Time: 07/30/17  3:04 PM  Result Value Ref Range Status   MRSA by PCR NEGATIVE NEGATIVE Final    Comment:        The GeneXpert MRSA Assay (FDA approved for NASAL specimens only), is one component of a comprehensive MRSA colonization surveillance program. It is not intended to diagnose MRSA infection nor to guide or monitor treatment for MRSA infections. Performed at [facility], [place] [address] [place], [place] [address]   Culture, blood (routine x 2) Call MD if unable to obtain prior to antibiotics being given     Status: None (Preliminary result)   Collection Time: 08/01/17  5:15 PM  Result Value Ref Range Status   Specimen Description BLOOD SITE NOT SPECIFIED  Final   Special Requests   Final    BOTTLES DRAWN AEROBIC AND ANAEROBIC Blood Culture adequate volume   Culture   Final    NO GROWTH 3 DAYS Performed at [facility], [place] [address], [place], [place] [address]    Report Status PENDING  Incomplete  Culture, blood (routine x 2) Call MD if  unable to obtain prior to antibiotics being given     Status: None (Preliminary result)   Collection Time: 08/01/17  5:55 PM  Result Value Ref Range Status   Specimen Description BLOOD SITE NOT SPECIFIED  Final   Special Requests   Final    BOTTLES DRAWN AEROBIC AND ANAEROBIC Blood Culture adequate volume   Culture   Final    NO GROWTH 3 DAYS Performed at [facility], [place] [address] [place], [place] [address]    Report Status PENDING  Incomplete         Radiology Studies: No results found.      Scheduled Meds: . acetaminophen  650 mg Oral BID  . calcium acetate  2,001 mg Oral TID WC  . cinacalcet  30 mg Oral Q T,Th,Sa-HD  . [START ON 08/05/2017] darbepoetin (ARANESP) injection - DIALYSIS  100 mcg Intravenous Q Thu-HD  . feeding supplement (NEPRO CARB STEADY)  237 mL Oral TID WC  . heparin  5,000 Units Subcutaneous Q8H  . insulin aspart  0-5 Units Subcutaneous QHS  . insulin aspart  0-9 Units Subcutaneous TID WC  . latanoprost  1 drop Left Eye QHS  . lidocaine-prilocaine  1 application Topical Q T,Th,Sa-HD  . loratadine  10 mg Oral Daily  . Melatonin  4.5 mg Oral QHS  . metoCLOPramide  5 mg Oral TID AC  . multivitamin  1 tablet Oral QHS  . neomycin-polymyxin b-dexamethasone  1 drop Right Eye BID  . oxyCODONE      . oxyCODONE  5 mg Oral BID  . pantoprazole  40 mg Oral BID  . polyethylene glycol  17 g Oral BID  . pravastatin  20 mg Oral QHS  . rOPINIRole  1 mg Oral QHS  . senna-docusate  2 tablet Oral QHS  . sorbitol  30 mL Oral Daily   Continuous Infusions: . ampicillin-sulbactam (UNASYN) IV Stopped (08/04/17 0450)     LOS: 4 days    Time spent: 35 minutes.     Hosie Poisson, MD [facility] Pager [phone]  If 7PM-7AM, please contact night-coverage [email or web address] Password [facility] 08/04/2017, 3:13 PM

## 2017-08-04 NOTE — Progress Notes (Signed)
Stefanie [name] 2:46 PM  Subjective: Patient doing fine from her endoscopy and no complaints currently and we discussed the findings with both the patient and the renal team  Objective: Vital signs stable afebrile no acute distress abdomen is soft nontender  labs stable Assessment: Severe esophagitis  Plan: Await biopsies and brushings and please call me sooner if I can be of any further assistance  [facility]  Pager [phone] After 5PM or if no answer call [phone]

## 2017-08-05 LAB — CBC
HCT: 27 % — ABNORMAL LOW (ref 36.0–46.0)
Hemoglobin: 8.2 g/dL — ABNORMAL LOW (ref 12.0–15.0)
MCH: 31.7 pg (ref 26.0–34.0)
MCHC: 30.4 g/dL (ref 30.0–36.0)
MCV: 104.2 fL — AB (ref 78.0–100.0)
Platelets: 116 10*3/uL — ABNORMAL LOW (ref 150–400)
RBC: 2.59 MIL/uL — ABNORMAL LOW (ref 3.87–5.11)
RDW: 17.4 % — ABNORMAL HIGH (ref 11.5–15.5)
WBC: 6.3 10*3/uL (ref 4.0–10.5)

## 2017-08-05 LAB — BASIC METABOLIC PANEL
ANION GAP: 12 (ref 5–15)
BUN: 18 mg/dL (ref 6–20)
CALCIUM: 9.5 mg/dL (ref 8.9–10.3)
CO2: 32 mmol/L (ref 22–32)
Chloride: 97 mmol/L — ABNORMAL LOW (ref 101–111)
Creatinine, Ser: 4.61 mg/dL — ABNORMAL HIGH (ref 0.44–1.00)
GFR calc Af Amer: 10 mL/min — ABNORMAL LOW (ref >60)
GFR calc non Af Amer: 9 mL/min — ABNORMAL LOW (ref >60)
GLUCOSE: 107 mg/dL — AB (ref 65–99)
Potassium: 3.7 mmol/L (ref 3.5–5.1)
Sodium: 141 mmol/L (ref 135–145)

## 2017-08-05 LAB — GLUCOSE, CAPILLARY
GLUCOSE-CAPILLARY: 108 mg/dL — AB (ref 65–99)
Glucose-Capillary: 81 mg/dL (ref 65–99)
Glucose-Capillary: 318 mg/dL — ABNORMAL HIGH (ref 65–99)

## 2017-08-05 MED ORDER — DARBEPOETIN ALFA 100 MCG/0.5ML IJ SOSY: 100.0000 ug | PREFILLED_SYRINGE | INTRAMUSCULAR | Status: AC

## 2017-08-05 MED ORDER — NEPRO/CARBSTEADY PO LIQD: 237.0000 mL | Freq: Three times a day (TID) | ORAL | 0 refills | Status: DC

## 2017-08-05 MED ORDER — AMOXICILLIN-POT CLAVULANATE 250-62.5 MG/5ML PO SUSR: 250.0000 mg | Freq: Every day | ORAL | 0 refills | Status: AC

## 2017-08-05 MED ORDER — PANTOPRAZOLE SODIUM 40 MG PO TBEC: 40.0000 mg | DELAYED_RELEASE_TABLET | Freq: Two times a day (BID) | ORAL | 2 refills | Status: AC

## 2017-08-05 MED ORDER — DARBEPOETIN ALFA 100 MCG/0.5ML IJ SOSY
PREFILLED_SYRINGE | INTRAMUSCULAR | Status: AC
  Administered 2017-08-05: 100 ug
  Filled 2017-08-05: qty 0.5

## 2017-08-05 MED ORDER — CINACALCET HCL 30 MG PO TABS: 30.0000 mg | ORAL_TABLET | ORAL | Status: AC

## 2017-08-05 NOTE — Social Work (Addendum)
CSW faxed FL2 and discharge summary to [facility], await reply for discharge.   3:30pm- adjustments made and fl2 updated, faxed to facility.   Alexander Mt, [place] Work [phone]

## 2017-08-05 NOTE — Care Management Note (Signed)
Case Management Note  Patient Details  Name: Stefanie [name] MRN: [id number] Date of Birth: 04-21-1945  Subjective/Objective:                    Action/Plan: Patient from [facility] requesting home health to do set up with [facility]. Referral given to Dorian Pod with [facility]   Expected Discharge Date:  08/05/17               Expected Discharge Plan:  Assisted Living / Rest Home  In-House Referral:  Clinical Social Work  Discharge planning Services  CM Consult  Post Acute Care Choice:  Home Health Choice offered to:  Patient  DME Arranged:  N/A DME Agency:  NA  HH Arranged:  RN, PT, OT HH Agency:  Other - See comment  Status of Service:  Completed, signed off  If discussed at [place] of Stay Meetings, dates discussed:    Additional Comments:  Marilu Favre, RN 08/05/2017, 2:58 PM

## 2017-08-05 NOTE — Progress Notes (Signed)
Report given to Oris Drone, Therapist, sports at [facility].

## 2017-08-05 NOTE — Care Management Important Message (Signed)
Important Message  Patient Details  Name: Stefanie [name] MRN: [id number] Date of Birth: 10-Oct-1945   Medicare Important Message Given:  Yes    Brittnei Jagiello [address] 08/05/2017, 10:36 AM

## 2017-08-05 NOTE — Social Work (Signed)
Clinical Social Worker facilitated patient discharge including contacting patient family and facility to confirm patient discharge plans.  Clinical information faxed to facility and family agreeable with plan.  CSW arranged ambulance transport via [facility] to [facility].  RN to call [phone] with report  prior to discharge.  Clinical Social Worker will sign off for now as social work intervention is no longer needed. Please consult [place] again if new need arises.  Alexander Mt, [place] Social Worker [phone]

## 2017-08-05 NOTE — Progress Notes (Signed)
PT Cancellation Note  Patient Details Name: Stefanie [name] MRN: [id number] DOB: 22-Mar-1945   Cancelled Treatment:    Reason Eval/Treat Not Completed: Patient at procedure or test/unavailable.  Pt in HD.    Thanks,    Barbarann Ehlers. [place], [place], DPT [phone]   08/05/2017, 11:18 AM

## 2017-08-05 NOTE — Clinical Social Work Placement (Signed)
   CLINICAL SOCIAL WORK PLACEMENT  NOTE Cecilie Kicks RN to call [phone]  Date:  08/05/2017  Patient Details  Name: Stefanie [name] MRN: [id number] Date of Birth: 02/09/1946  Clinical Social Work is seeking post-discharge placement for this patient at the [place] level of care (*CSW will initial, date and re-position this form in  chart as items are completed):      Patient/family provided with [place] Work Department's list of facilities offering this level of care within the geographic area requested by the patient (or if unable, by the patient's family).  Yes   Patient/family informed of their freedom to choose among providers that offer the needed level of care, that participate in Medicare, Medicaid or managed care program needed by the patient, have an available bed and are willing to accept the patient.      Patient/family informed of [place]'s ownership interest in [facility] and [facility], as well as of the fact that they are under no obligation to receive care at these facilities.  PASRR submitted to EDS on       PASRR number received on       Existing PASRR number confirmed on       FL2 transmitted to all facilities in geographic area requested by pt/family on 08/05/17     FL2 transmitted to all facilities within larger geographic area on       Patient informed that his/her managed care company has contracts with or will negotiate with certain facilities, including the following:        Yes   Patient/family informed of bed offers received.  Patient chooses bed at Other - please specify in the comment section below:([facility])     Physician recommends and patient chooses bed at      Patient to be transferred to Other - please specify in the comment section below:([facility]) on 08/05/17.  Patient to be transferred to facility by [facility]     Patient family notified on 08/05/17 of transfer.  Name of  family member notified:  daughter Feleicia     PHYSICIAN       Additional Comment:    _______________________________________________ Alexander Mt, [place] 08/05/2017, 3:54 PM

## 2017-08-05 NOTE — Progress Notes (Addendum)
Patient left [facility] via [facility] and going to [facility] room 25.  Report given by day shift RN earlier this afternoon and discharged documents were were explained to patient, patient verbalized understanding and documents then given to [facility] staff.

## 2017-08-05 NOTE — Procedures (Signed)
Patient was seen on dialysis and the procedure was supervised.  BFR 400  Via AVG  BP is  128/54. UF goal 1 kg   Patient appears to be tolerating treatment well  Dron Tanna Furry 08/05/2017

## 2017-08-05 NOTE — NC FL2 (Signed)
[place] MEDICAID FL2 LEVEL OF CARE SCREENING TOOL     IDENTIFICATION  Patient Name: Stefanie [name] Birthdate: Apr 09, 1945 Sex: female Admission Date (Current Location): 07/29/2017  [facility] and [place] Number:  Herbalist and Address:  [facility], [place] [address], [place], [place] [address]      Provider Number: [id number]  Attending Physician Name and Address:  Hosie Poisson, MD  Relative Name and Phone Number:  Aliene Altes [phone]    Current Level of Care: Hospital Recommended Level of Care: [place] Prior Approval Number:    Date Approved/Denied:   PASRR Number:    Discharge Plan: Other (Comment)([facility])    Current Diagnoses: Patient Active Problem List   Diagnosis Date Noted  . Nausea & vomiting 07/30/2017  . Acute blood loss anemia 02/09/2016  . Hypotension due to blood loss 02/09/2016  . Chronic combined systolic and diastolic CHF (congestive heart failure) ([place]) 02/09/2016  . Hemorrhage 02/08/2016  . Anemia 02/08/2016  . Left leg pain 04/10/2015  . Bacteremia 04/10/2015  . Lactic acidosis   . Cellulitis 04/09/2015  . History of CVA with residual deficit 02/11/2015  . Weakness generalized 02/08/2015  . DVT (deep venous thrombosis) ([place])   . CHF (congestive heart failure) ([place])   . Chronic diastolic congestive heart failure ([place])   . ESRD (end stage renal disease) ([place])   . Congestive dilated cardiomyopathy ([place])   . Left leg DVT ([place]) 02/01/2015  . Leukocytosis 02/01/2015  . Elevated troponin 02/01/2015  . SOB (shortness of breath) 02/01/2015  . Type 2 diabetes, controlled, with neuropathy ([place]) 12/20/2014  . Pain due to onychomycosis of toenail 12/20/2014  . 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 Mechanical complication of other vascular device, implant, and graft 05/19/2013  . PAD (peripheral artery disease) ([place]) 02/24/2012  . Murmur 12/24/2011  . Bradycardia-intermittent sinus  07/10/2010  . DYSPNEA ON EXERTION 02/25/2010  . CHEST PAIN, EXERTIONAL 02/25/2010  . DM 02/24/2010  . PROTEIN MALNUTRITION 02/24/2010  . HLD (hyperlipidemia) 02/24/2010  . ANEMIA 02/24/2010  . Essential hypertension 02/24/2010  . Cerebral artery occlusion with cerebral infarction ([place]) 02/24/2010  . ESRD on dialysis ([place]) 02/24/2010  . 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/24/2010    Orientation RESPIRATION BLADDER Height & Weight     Self, Time, Situation, Place  Normal Continent Weight: 165 lb 5.5 oz (75 kg) Height:  5\' 4"  (162.6 cm)  BEHAVIORAL SYMPTOMS/MOOD NEUROLOGICAL BOWEL NUTRITION STATUS      Continent Diet((NATS))  AMBULATORY STATUS COMMUNICATION OF NEEDS Skin   Limited Assist(uses wheelchair mostly at baseline) Verbally Other (Comment)(AV Fistula Left Thigh)                       Personal Care Assistance Level of Assistance  Bathing, Feeding, Dressing Bathing Assistance: Limited assistance Feeding assistance: Independent Dressing Assistance: Limited assistance     Functional Limitations Info  Sight, Hearing, Speech Sight Info: Adequate(wears glasses) Hearing Info: Adequate Speech Info: Adequate    SPECIAL CARE FACTORS FREQUENCY  PT (By licensed PT), OT (By licensed OT)     PT Frequency: 3x week OT Frequency: 2x week            Contractures Contractures Info: Not present    Additional Factors Info  Code Status, Allergies, Insulin Sliding Scale Code Status Info: Full Code Allergies Info: No Known Allergies   Insulin Sliding Scale Info:  insulin aspart (novoLOG) injection 0-5 Units daily  at bedtime; insulin aspart (novoLOG) injection 0-9 Units 3x daily with meals       Current Medications (08/05/2017):  This is the current hospital active medication list Current Facility-Administered Medications  Medication Dose Route Frequency Provider Last Rate Last Dose  . acetaminophen (TYLENOL) tablet 650 mg  650 mg Oral Q6H PRN Clarene Essex, MD        Or  . acetaminophen (TYLENOL) suppository 650 mg  650 mg Rectal Q6H PRN Clarene Essex, MD      . acetaminophen (TYLENOL) tablet 650 mg  650 mg Oral BID Clarene Essex, MD   650 mg at 08/04/17 2135  . Ampicillin-Sulbactam (UNASYN) 3 g in sodium chloride 0.9 % 100 mL IVPB  3 g Intravenous Q12H Clarene Essex, MD   Stopped at 08/05/17 [phone]  . calcium acetate (PHOSLO) capsule 2,001 mg  2,001 mg Oral TID WC Clarene Essex, MD   2,001 mg at 08/05/17 1234  . calcium acetate (PHOSLO) capsule 667 mg  667 mg Oral PRN Clarene Essex, MD      . calcium carbonate (dosed in mg elemental calcium) suspension 500 mg of elemental calcium  500 mg of elemental calcium Oral Q6H PRN Clarene Essex, MD      . camphor-menthol [facility]) lotion 1 application  1 application Topical K9F PRN Clarene Essex, MD       And  . hydrOXYzine (ATARAX/VISTARIL) tablet 25 mg  25 mg Oral Q8H PRN Clarene Essex, MD      . cinacalcet (SENSIPAR) tablet 30 mg  30 mg Oral Q T,Th,Sa-HD Clarene Essex, MD   30 mg at 08/05/17 1233  . Darbepoetin Alfa (ARANESP) injection 100 mcg  100 mcg Intravenous Q Thu-HD Magod, Altamese Dilling, MD      . diclofenac sodium (VOLTAREN) 1 % transdermal gel 2 g  2 g Topical BID PRN Clarene Essex, MD      . feeding supplement (NEPRO CARB STEADY) liquid 237 mL  237 mL Oral TID WC Clarene Essex, MD   237 mL at 08/05/17 1234  . heparin injection 5,000 Units  5,000 Units Subcutaneous Q8H Clarene Essex, MD   5,000 Units at 08/05/17 1438  . insulin aspart (novoLOG) injection 0-5 Units  0-5 Units Subcutaneous QHS Clarene Essex, MD   4 Units at 08/04/17 2137  . insulin aspart (novoLOG) injection 0-9 Units  0-9 Units Subcutaneous TID WC Clarene Essex, MD   7 Units at 08/04/17 1718  . latanoprost (XALATAN) 0.005 % ophthalmic solution 1 drop  1 drop Left Eye Sandi Carne, MD   1 drop at 08/04/17 2136  . lidocaine-prilocaine (EMLA) cream 1 application  1 application Topical Q T,Th,Sa-HD Magod, Altamese Dilling, MD      . loratadine (CLARITIN) tablet 10 mg  10 mg Oral Daily Clarene Essex, MD   10 mg at 08/05/17 1438  . Melatonin TABS 4.5 mg  4.5 mg Oral QHS Clarene Essex, MD   4.5 mg at 08/04/17 2136  . metoCLOPramide (REGLAN) tablet 5 mg  5 mg Oral TID Renaye Rakers, MD   5 mg at 08/05/17 1233  . multivitamin (RENA-VIT) tablet 1 tablet  1 tablet Oral QHS Clarene Essex, MD   1 tablet at 08/04/17 2135  . neomycin-bacitracin-polymyxin (NEOSPORIN) ointment 1 application  1 application Topical PRN Clarene Essex, MD      . neomycin-polymyxin b-dexamethasone (MAXITROL) ophthalmic suspension 1 drop  1 drop Right Eye BID Clarene Essex, MD   1 drop at 08/05/17 1235  . ondansetron (ZOFRAN)  tablet 4 mg  4 mg Oral Q6H PRN Clarene Essex, MD       Or  . ondansetron [facility]) injection 4 mg  4 mg Intravenous Q6H PRN Clarene Essex, MD   4 mg at 08/03/17 3383  . oxyCODONE (Oxy IR/ROXICODONE) immediate release tablet 5 mg  5 mg Oral BID Clarene Essex, MD   5 mg at 08/05/17 1235  . pantoprazole (PROTONIX) EC tablet 40 mg  40 mg Oral BID Clarene Essex, MD   40 mg at 08/05/17 1235  . polyethylene glycol (MIRALAX / GLYCOLAX) packet 17 g  17 g Oral BID Clarene Essex, MD   17 g at 08/04/17 1215  . pravastatin (PRAVACHOL) tablet 20 mg  20 mg Oral QHS Clarene Essex, MD   20 mg at 08/04/17 2135  . rOPINIRole (REQUIP) tablet 1 mg  1 mg Oral QHS Clarene Essex, MD   1 mg at 08/04/17 2136  . senna-docusate (Senokot-S) tablet 2 tablet  2 tablet Oral QHS Clarene Essex, MD   2 tablet at 08/03/17 2125  . sorbitol 70 % solution 30 mL  30 mL Oral Daily Clarene Essex, MD   30 mL at 08/04/17 1215  . traZODone (DESYREL) tablet 25 mg  25 mg Oral QHS PRN Clarene Essex, MD         Discharge Medications: Please see discharge summary for a list of discharge medications.  Relevant Imaging Results:  Relevant Lab Results:   Additional Information SS#[id number]; TTS HD; Home Health RN/PT/OT to evaluate and treat  Alexander Mt, LCSWA

## 2017-08-05 NOTE — Discharge Summary (Addendum)
Physician Discharge Summary  Stefanie [name] ZDG:[id number] DOB: September 01, 1945 DOA: 07/29/2017  PCP: Raelyn Number, MD  Admit date: 07/29/2017 Discharge date: 08/05/2017  Admitted From: ALF Disposition:  ALF with home health PT, OT, RN.   Recommendations for Outpatient Follow-up:  1. Follow up with PCP in 1-2 weeks 2. Please obtain BMP/CBC in one week 3. Please follow up gastroenterology Dr Watt Climes as recommended  4. Follow up with SLP on discharge at the facility.  5. Recommend mighty shakes twice daily.     Discharge Condition: stable.  CODE STATUS: full code.  Diet recommendation: Heart Healthy / soft diet.   Brief/Interim Summary: Stefanie S Wrightis a 72 y.o.femalewith a history of ESRD, T2DM, HTN who presented to the ED with intractable nausea and vomiting. Work up reassuring, though symptoms continued and pt unable to tolerate po so was brought in for observation. She reports her last BM was 5/13 and stool burden was noted on CT abdomen/pelvis.CT also demonstrated dilated esophagus and upper normal size stomach without obstruction.Bowel regimen was started including enemas with eventual resolution of constipation 5/18. She developed a cough, fever and infiltrates on CXR consistent with pneumonia5/19. With concern for aspiration, unasyn started, SLP evaluation ordered. Per oral intakedid not improve, with ongoing emesis. GI consulted and EGD 5/21 found severe reflux esophagitis, biopsy taken. PPI to be continued.     Discharge Diagnoses:  Principal Problem:   Nausea & vomiting Active Problems:   Essential hypertension   ESRD on dialysis ([place])   Type 2 diabetes, controlled, with neuropathy (HCC)   Congestive dilated cardiomyopathy (HCC)   Weakness generalized  Severe abd pain, with anusea, and vomiting: possibly from severe reflux esophagitis GI consulted and s/p EGD showing severe reflux esophagitis.  - resume PPI.  - biopsy shows necro inflammatory debris.     Aspiration pneumonia:  Improving.  SLP evaluation completed.  Silent aspiration possibly from reflux.  Complete the course of antibiotics.  Afebrile and leukocytosis resolved. Complete the course of antibiotics with augmentin 250 mg daily for 3 more days.     ESRD on HD.  Further management as per renal.    CAD  No chest pain.    Hypertension well controlled.    Type 2 DM, uncontrolled with hyperglycemia>  hgba1c is 7.7.  CBG (last 3)  Recent Labs    08/04/17 2129 08/05/17 0714 08/05/17 1220  GLUCAP 317* 108* 81  resume SSI.    H/O chronic systolic and diastolic CHF She appears to be compensated.        Discharge Instructions  Discharge Instructions    Diet - low sodium heart healthy   Complete by:  As directed    Discharge instructions   Complete by:  As directed    Please follow up with gastroenterology as recommended to follow up the biopsy results.     Allergies as of 08/05/2017   No Known Allergies     Medication List    STOP taking these medications   lisinopril 5 MG tablet Commonly known as:  PRINIVIL,ZESTRIL     TAKE these medications   acetaminophen 325 MG tablet Commonly known as:  TYLENOL Take 650 mg by mouth 2 (two) times daily.   amoxicillin-clavulanate 250-62.5 MG/5ML suspension Commonly known as:  AUGMENTIN Take 5 mLs (250 mg total) by mouth daily for 3 days. Start taking on:  08/06/2017   b complex-vitamin c-folic acid 0.8 MG Tabs tablet Take 1 tablet by mouth See admin instructions. Every Monday, Wednesday  and Friday   calcium acetate 667 MG capsule Commonly known as:  PHOSLO Take 667-2,001 mg by mouth See admin instructions. Take 3 capsules three times a day with meals and take 1 capsule twice daily with snacks   cinacalcet 30 MG tablet Commonly known as:  SENSIPAR Take 1 tablet (30 mg total) by mouth Every Tuesday,Thursday,and Saturday with dialysis.   Darbepoetin Alfa 100 MCG/0.5ML Sosy  injection Commonly known as:  ARANESP Inject 0.5 mLs (100 mcg total) into the vein every Thursday with hemodialysis.   glipiZIDE 10 MG tablet Commonly known as:  GLUCOTROL Take 10 mg by mouth 2 (two) times daily before a meal.   lidocaine-prilocaine cream Commonly known as:  EMLA Apply 1 application topically as needed (topical anesthesia for hemodialysis if Gebauers and Lidocaine injection are ineffective.). What changed:    when to take this  additional instructions   loratadine 10 MG tablet Commonly known as:  CLARITIN Take 10 mg by mouth daily.   Melatonin 5 MG Tabs Take 5 mg by mouth at bedtime.   neomycin-bacitracin-polymyxin ointment Commonly known as:  NEOSPORIN Apply 1 application topically as needed for wound care.   neomycin-polymyxin b-dexamethasone 3.5-10000-0.1 Susp Commonly known as:  MAXITROL Place 1 drop into the right eye 2 (two) times daily.   oxyCODONE 5 MG immediate release tablet Commonly known as:  Oxy IR/ROXICODONE Take 5 mg by mouth 2 (two) times daily.   pantoprazole 40 MG tablet Commonly known as:  PROTONIX Take 1 tablet (40 mg total) by mouth 2 (two) times daily.   polyethylene glycol packet Commonly known as:  MIRALAX Use 17 gms three times daily until bowels move, with maximum of 3 consecutive days What changed:    how much to take  how to take this  when to take this  additional instructions   pravastatin 20 MG tablet Commonly known as:  PRAVACHOL Take 20 mg by mouth at bedtime.   rOPINIRole 1 MG tablet Commonly known as:  REQUIP Take 1 mg by mouth at bedtime.   sennosides-docusate sodium 8.6-50 MG tablet Commonly known as:  SENOKOT-S Take 2 tablets by mouth daily as needed for constipation.   sorbitol 70 % solution Take 30 mLs by mouth daily as needed (for constipation).   traZODone 50 MG tablet Commonly known as:  DESYREL Take 25 mg by mouth as needed.   VOLTAREN 1 % Gel Generic drug:  diclofenac sodium Apply 2  g topically See admin instructions. Apply to both knees daily as needed for pain and to right hand 2-3 times a day as needed for pain (BRAND NAME VOLTAREN)   XALATAN 0.005 % ophthalmic solution Generic drug:  latanoprost Place 1 drop into the left eye at bedtime.      Follow-up Information    Raelyn Number, MD. Schedule an appointment as soon as possible for a visit in 1 week(s).   Specialty:  Internal Medicine Contact information: [address] [place] [address] [phone]          No Known Allergies  Consultations:  Gastroenterology Dr Watt Climes.    Procedures/Studies: Ct Abdomen Pelvis W Contrast  Result Date: 07/29/2017 CLINICAL DATA:  Periumbilical pain with nausea vomiting. Last bowel movement 5 days ago. EXAM: CT ABDOMEN AND PELVIS WITH CONTRAST TECHNIQUE: Multidetector CT imaging of the abdomen and pelvis was performed using the standard protocol following bolus administration of intravenous contrast. CONTRAST:  1107mL OMNIPAQUE IOHEXOL 300 MG/ML  SOLN COMPARISON:  06/18/2016 FINDINGS: Lower chest:  Bilateral pleural thickening versus very small pleural effusions. Calcific atherosclerotic disease of the coronary arteries. Dilated distal esophagus. Hepatobiliary: Several too small to be accurately characterize hypoattenuated lesions throughout the liver. Normal appearance of the gallbladder. Pancreas: Unremarkable. No pancreatic ductal dilatation or surrounding inflammatory changes. Spleen: 13 mm circumscribed hypoattenuated lesion within the spleen, stable. Adrenals/Urinary Tract: Atrophic kidneys containing multiple hypoattenuated lesions. A punctate calcifications within bilateral kidneys. The bladder is decompressed and therefore suboptimally evaluated. Stomach/Bowel: Stomach is within normal limits. No evidence of bowel wall thickening, distention, or inflammatory changes. Moderate amount of formed stool throughout the colon, consistent with the given history of  constipation. Vascular/Lymphatic: Aortic atherosclerosis. No enlarged abdominal or pelvic lymph nodes. Left external iliac vein stenting. Left thigh AV graft with stable appearance. Reproductive: Leiomyomatous uterus.  No evidence of adnexal masses. Other: No abdominal wall hernia or abnormality. No abdominopelvic ascites. Musculoskeletal: Findings of renal osteodystrophy. Multilevel thoraco lumbar spine spondylosis. IMPRESSION: Mucosal thickening and dilation of the distal esophagus, partially visualized due to collimation. No evidence of acute findings within the abdomen or pelvis. Chronic findings of atrophic kidneys containing multiple hypoattenuated lesions and punctate calcifications. Moderate amount of formed stool throughout the colon consistent with the given history of constipation. Multiple too small to be actually characterized hypoattenuated lesions throughout the liver, stable in therefore likely benign. Calcific atherosclerotic disease of the aorta and coronary arteries. Electronically Signed   By: Fidela Salisbury M.D.   On: 07/29/2017 22:31   Dg Chest Port 1 View  Result Date: 08/01/2017 CLINICAL DATA:  Chest congestion with cough. EXAM: PORTABLE CHEST 1 VIEW COMPARISON:  07/29/2017 FINDINGS: Lungs are adequately inflated demonstrate airspace consolidation over the left lower lobe and minimal hazy density in the right infrahilar region. Possible small amount left pleural fluid. Mild stable cardiomegaly. Stable prominence of the main pulmonary artery segment. Remainder of the exam is unchanged. IMPRESSION: Airspace consolidation over the left lower lobe with mild hazy density over the right infrahilar region likely due to infection. Possible small amount left pleural fluid. Stable cardiomegaly. Stable prominence of the main pulmonary artery segment which may indicate a degree of pulmonary arterial hypertension. Electronically Signed   By: Marin Olp M.D.   On: 08/01/2017 10:58   Dg Chest  Port 1 View  Result Date: 07/29/2017 CLINICAL DATA:  Nausea and vomiting.  Hypertension. EXAM: PORTABLE CHEST 1 VIEW COMPARISON:  September 01, 2015 FINDINGS: There is no edema or consolidation. Heart is mildly enlarged with pulmonary vascularity within normal limits. No adenopathy. There is a linear metallic foreign body over the upper right hemithorax. No bone lesions. There is a stent in the left brachial and axillary regions. IMPRESSION: No edema or consolidation.  Stable cardiomegaly. Electronically Signed   By: Lowella Grip III M.D.   On: 07/29/2017 16:49      Subjective:  No new complaints.  Discharge Exam: Vitals:   08/05/17 1100 08/05/17 1130  BP: (!) 104/42 (!) 113/54  Pulse: 76 73  Resp:  18  Temp:  98.4 F (36.9 C)  SpO2:  100%   Vitals:   08/05/17 1000 08/05/17 1030 08/05/17 1100 08/05/17 1130  BP: (!) 112/46 (!) 128/54 (!) 104/42 (!) 113/54  Pulse: 78 78 76 73  Resp:    18  Temp:    98.4 F (36.9 C)  TempSrc:    Oral  SpO2:    100%  Weight:      Height:        General: Pt is  alert, awake, not in acute distress Cardiovascular: RRR, S1/S2 +, no rubs, no gallops Respiratory: CTA bilaterally, no wheezing, no rhonchi Abdominal: Soft, NT, ND, bowel sounds + Extremities: no edema, no cyanosis    The results of significant diagnostics from this hospitalization (including imaging, microbiology, ancillary and laboratory) are listed below for reference.     Microbiology: Recent Results (from the past 240 hour(s))  Blood culture (routine x 2)     Status: None   Collection Time: 07/29/17 10:40 PM  Result Value Ref Range Status   Specimen Description BLOOD RIGHT HAND  Final   Special Requests   Final    BOTTLES DRAWN AEROBIC AND ANAEROBIC Blood Culture results may not be optimal due to an inadequate volume of blood received in culture bottles   Culture   Final    NO GROWTH 5 DAYS Performed at [facility], [place] [address], [place], [place] [address]     Report Status 08/04/2017 FINAL  Final  Blood culture (routine x 2)     Status: None   Collection Time: 07/29/17 11:10 PM  Result Value Ref Range Status   Specimen Description BLOOD LEFT HAND  Final   Special Requests   Final    BOTTLES DRAWN AEROBIC AND ANAEROBIC Blood Culture adequate volume   Culture   Final    NO GROWTH 5 DAYS Performed at [facility], [place] [address] [place], [place] [address]    Report Status 08/04/2017 FINAL  Final  MRSA PCR Screening     Status: None   Collection Time: 07/30/17  3:04 PM  Result Value Ref Range Status   MRSA by PCR NEGATIVE NEGATIVE Final    Comment:        The GeneXpert MRSA Assay (FDA approved for NASAL specimens only), is one component of a comprehensive MRSA colonization surveillance program. It is not intended to diagnose MRSA infection nor to guide or monitor treatment for MRSA infections. Performed at [facility], [place] [address] [place], [place] [address]   Culture, blood (routine x 2) Call MD if unable to obtain prior to antibiotics being given     Status: None (Preliminary result)   Collection Time: 08/01/17  5:15 PM  Result Value Ref Range Status   Specimen Description BLOOD SITE NOT SPECIFIED  Final   Special Requests   Final    BOTTLES DRAWN AEROBIC AND ANAEROBIC Blood Culture adequate volume   Culture   Final    NO GROWTH 4 DAYS Performed at [facility], [place] [address], [place], [place] [address]    Report Status PENDING  Incomplete  Culture, blood (routine x 2) Call MD if unable to obtain prior to antibiotics being given     Status: None (Preliminary result)   Collection Time: 08/01/17  5:55 PM  Result Value Ref Range Status   Specimen Description BLOOD SITE NOT SPECIFIED  Final   Special Requests   Final    BOTTLES DRAWN AEROBIC AND ANAEROBIC Blood Culture adequate volume   Culture   Final    NO GROWTH 4 DAYS Performed at [facility], [place] [address] [place], [place] [address]     Report Status PENDING  Incomplete     Labs: BNP (last 3 results) No results for input(s): BNP in the last 8760 hours. Basic Metabolic Panel: Recent Labs  Lab 07/29/17 1708 07/30/17 0237 08/01/17 0557 08/03/17 0747 08/04/17 0630 08/05/17 0527  NA 140 140 136  136 137 141  K 3.7 3.8 3.7 4.8 4.7 3.7  CL 87* 90* 89*  --  94* 97*  CO2 35* 31 30  --  30 32  GLUCOSE 175* 313* 60* 104* 230* 107*  BUN 5* 15 43*  --  45* 18  CREATININE 4.23* 5.42* 8.59*  --  8.08* 4.61*  CALCIUM 10.8* 9.7 9.6  --  9.6 9.5  PHOS  --   --  4.0  --  4.2  --    Liver Function Tests: Recent Labs  Lab 07/29/17 1708 07/30/17 0237 08/01/17 0557 08/04/17 0630  AST 38 27  --   --   ALT 20 17  --   --   ALKPHOS 98 84  --   --   BILITOT 0.9 1.2  --   --   PROT 8.4* 7.2  --   --   ALBUMIN 4.5 3.8 3.0* 2.6*   Recent Labs  Lab 07/29/17 1708  LIPASE 26   No results for input(s): AMMONIA in the last 168 hours. CBC: Recent Labs  Lab 07/30/17 0237 08/01/17 0557 08/02/17 0339 08/03/17 0747 08/04/17 0630 08/05/17 0527  WBC 10.3 11.4* 8.4  --  5.6 6.3  NEUTROABS 8.9*  --   --   --   --   --   HGB 11.3* 10.1* 9.5* 9.2* 8.3* 8.2*  HCT 37.0 32.2* 30.9* 27.0* 27.8* 27.0*  MCV 104.5* 100.6* 102.7*  --  104.9* 104.2*  PLT 133* 135* 108*  --  117* 116*   Cardiac Enzymes: No results for input(s): CKTOTAL, CKMB, CKMBINDEX, TROPONINI in the last 168 hours. BNP: Invalid input(s): POCBNP CBG: Recent Labs  Lab 08/04/17 1211 08/04/17 1701 08/04/17 2129 08/05/17 0714 08/05/17 1220  GLUCAP 147* 321* 317* 108* 81   D-Dimer No results for input(s): DDIMER in the last 72 hours. Hgb A1c No results for input(s): HGBA1C in the last 72 hours. Lipid Profile No results for input(s): CHOL, HDL, LDLCALC, TRIG, CHOLHDL, LDLDIRECT in the last 72 hours. Thyroid function studies No results for input(s): TSH, T4TOTAL, T3FREE, THYROIDAB in the last 72 hours.  Invalid input(s): FREET3 Anemia work up No results for  input(s): VITAMINB12, FOLATE, FERRITIN, TIBC, IRON, RETICCTPCT in the last 72 hours. Urinalysis No results found for: COLORURINE, APPEARANCEUR, [place], [place], [place], [place], [place], [place], PROTEINUR, UROBILINOGEN, NITRITE, LEUKOCYTESUR Sepsis Labs Invalid input(s): PROCALCITONIN,  WBC,  LACTICIDVEN Microbiology Recent Results (from the past 240 hour(s))  Blood culture (routine x 2)     Status: None   Collection Time: 07/29/17 10:40 PM  Result Value Ref Range Status   Specimen Description BLOOD RIGHT HAND  Final   Special Requests   Final    BOTTLES DRAWN AEROBIC AND ANAEROBIC Blood Culture results may not be optimal due to an inadequate volume of blood received in culture bottles   Culture   Final    NO GROWTH 5 DAYS Performed at [facility], [place] [address] [place], [place] [address]    Report Status 08/04/2017 FINAL  Final  Blood culture (routine x 2)     Status: None   Collection Time: 07/29/17 11:10 PM  Result Value Ref Range Status   Specimen Description BLOOD LEFT HAND  Final   Special Requests   Final    BOTTLES DRAWN AEROBIC AND ANAEROBIC Blood Culture adequate volume   Culture   Final    NO GROWTH 5 DAYS Performed at [facility], [place] [address], [place], [place]  [address]    Report Status 08/04/2017 FINAL  Final  MRSA PCR Screening     Status: None   Collection Time: 07/30/17  3:04 PM  Result Value Ref Range Status   MRSA by PCR NEGATIVE NEGATIVE Final    Comment:        The GeneXpert MRSA Assay (FDA approved for NASAL specimens only), is one component of a comprehensive MRSA colonization surveillance program. It is not intended to diagnose MRSA infection nor to guide or monitor treatment for MRSA infections. Performed at [facility], [place] [address] [place], [place] [address]   Culture, blood (routine x 2) Call MD if unable to obtain prior to antibiotics being given     Status: None (Preliminary result)   Collection Time:  08/01/17  5:15 PM  Result Value Ref Range Status   Specimen Description BLOOD SITE NOT SPECIFIED  Final   Special Requests   Final    BOTTLES DRAWN AEROBIC AND ANAEROBIC Blood Culture adequate volume   Culture   Final    NO GROWTH 4 DAYS Performed at [facility], [place] [address], [place], [place] [address]    Report Status PENDING  Incomplete  Culture, blood (routine x 2) Call MD if unable to obtain prior to antibiotics being given     Status: None (Preliminary result)   Collection Time: 08/01/17  5:55 PM  Result Value Ref Range Status   Specimen Description BLOOD SITE NOT SPECIFIED  Final   Special Requests   Final    BOTTLES DRAWN AEROBIC AND ANAEROBIC Blood Culture adequate volume   Culture   Final    NO GROWTH 4 DAYS Performed at [facility], [place] [address] [place], [place] [address]    Report Status PENDING  Incomplete     Time coordinating discharge: 38  minutes  SIGNED:   Hosie Poisson, MD  [facility] 08/05/2017, 2:54 PM Pager   If 7PM-7AM, please contact night-coverage [email or web address] Password [email or web address]

## 2017-08-05 NOTE — Progress Notes (Signed)
[name] 2:50 PM  Subjective: Patient doing okay without any new problems and we reviewed her biopsy report  Objective: Vital signs stable afebrile patient looks unchangedhemoglobin stable biopsies compatible with necrotic debris which is how it looked  Assessment: Significant reflux due to hiatal hernia gastroparesis and frequent lying down  Plan: Antireflux measures and as much as possible twice a day pump inhibitors until better than once a day long-term and I'm happy see back in the office as needed and consider repeat endoscopy in 2-3 months to document healing  [facility]  Pager [phone] After 5PM or if no answer call [phone]

## 2017-08-06 LAB — CULTURE, BLOOD (ROUTINE X 2)
CULTURE: NO GROWTH
Culture: NO GROWTH
SPECIAL REQUESTS: ADEQUATE
Special Requests: ADEQUATE

## 2017-08-24 ENCOUNTER — Emergency Department (HOSPITAL_COMMUNITY)
Admission: EM | Admit: 2017-08-24 | Discharge: 2017-08-25 | Disposition: A | Discharge status: Home / Self Care | Payer: Medicare Other | Attending: Emergency Medicine | Admitting: Emergency Medicine

## 2017-08-24 ENCOUNTER — Encounter (HOSPITAL_COMMUNITY): Payer: Self-pay | Admitting: Emergency Medicine

## 2017-08-24 ENCOUNTER — Emergency Department (HOSPITAL_COMMUNITY): Payer: Medicare Other

## 2017-08-24 ENCOUNTER — Other Ambulatory Visit: Payer: Self-pay

## 2017-08-24 DIAGNOSIS — I480 Paroxysmal atrial fibrillation: Secondary | ICD-10-CM | POA: Diagnosis not present

## 2017-08-24 DIAGNOSIS — R531 Weakness: Secondary | ICD-10-CM | POA: Diagnosis not present

## 2017-08-24 DIAGNOSIS — Z87891 Personal history of nicotine dependence: Secondary | ICD-10-CM | POA: Insufficient documentation

## 2017-08-24 DIAGNOSIS — Z992 Dependence on renal dialysis: Secondary | ICD-10-CM | POA: Insufficient documentation

## 2017-08-24 DIAGNOSIS — N186 End stage renal disease: Secondary | ICD-10-CM | POA: Diagnosis not present

## 2017-08-24 DIAGNOSIS — R0682 Tachypnea, not elsewhere classified: Secondary | ICD-10-CM | POA: Diagnosis not present

## 2017-08-24 DIAGNOSIS — Z79899 Other long term (current) drug therapy: Secondary | ICD-10-CM | POA: Diagnosis not present

## 2017-08-24 DIAGNOSIS — I2510 Atherosclerotic heart disease of native coronary artery without angina pectoris: Secondary | ICD-10-CM | POA: Diagnosis not present

## 2017-08-24 DIAGNOSIS — E1122 Type 2 diabetes mellitus with diabetic chronic kidney disease: Secondary | ICD-10-CM | POA: Insufficient documentation

## 2017-08-24 DIAGNOSIS — I132 Hypertensive heart and chronic kidney disease with heart failure and with stage 5 chronic kidney disease, or end stage renal disease: Secondary | ICD-10-CM | POA: Insufficient documentation

## 2017-08-24 DIAGNOSIS — I5042 Chronic combined systolic (congestive) and diastolic (congestive) heart failure: Secondary | ICD-10-CM | POA: Diagnosis not present

## 2017-08-24 DIAGNOSIS — Z7984 Long term (current) use of oral hypoglycemic drugs: Secondary | ICD-10-CM | POA: Diagnosis not present

## 2017-08-24 LAB — BASIC METABOLIC PANEL
Anion gap: 15 (ref 5–15)
BUN: 9 mg/dL (ref 6–20)
CHLORIDE: 100 mmol/L — AB (ref 101–111)
CO2: 26 mmol/L (ref 22–32)
Calcium: 8.3 mg/dL — ABNORMAL LOW (ref 8.9–10.3)
Creatinine, Ser: 3.82 mg/dL — ABNORMAL HIGH (ref 0.44–1.00)
GFR calc Af Amer: 13 mL/min — ABNORMAL LOW (ref >60)
GFR calc non Af Amer: 11 mL/min — ABNORMAL LOW (ref >60)
Glucose, Bld: 201 mg/dL — ABNORMAL HIGH (ref 65–99)
Potassium: 3.3 mmol/L — ABNORMAL LOW (ref 3.5–5.1)
SODIUM: 141 mmol/L (ref 135–145)

## 2017-08-24 LAB — CBC WITH DIFFERENTIAL/PLATELET
Band Neutrophils: 0 %
Basophils Absolute: 0 10*3/uL (ref 0.0–0.1)
Basophils Relative: 0 %
Blasts: 0 %
EOS PCT: 0 %
Eosinophils Absolute: 0 10*3/uL (ref 0.0–0.7)
HCT: 27.1 % — ABNORMAL LOW (ref 36.0–46.0)
HEMOGLOBIN: 8.2 g/dL — AB (ref 12.0–15.0)
LYMPHS PCT: 14 %
Lymphs Abs: 0.9 10*3/uL (ref 0.7–4.0)
MCH: 32.3 pg (ref 26.0–34.0)
MCHC: 30.3 g/dL (ref 30.0–36.0)
MCV: 106.7 fL — ABNORMAL HIGH (ref 78.0–100.0)
MONO ABS: 0.2 10*3/uL (ref 0.1–1.0)
MONOS PCT: 3 %
MYELOCYTES: 0 %
Metamyelocytes Relative: 0 %
NEUTROS ABS: 5.5 10*3/uL (ref 1.7–7.7)
NEUTROS PCT: 83 %
NRBC: 0 /100{WBCs}
Platelets: 126 10*3/uL — ABNORMAL LOW (ref 150–400)
Promyelocytes Relative: 0 %
RBC: 2.54 MIL/uL — AB (ref 3.87–5.11)
RDW: 17.4 % — AB (ref 11.5–15.5)
WBC: 6.6 10*3/uL (ref 4.0–10.5)

## 2017-08-24 NOTE — ED Notes (Signed)
Per MD, Pt to have PO challenge. Pt tolerated Po fluids

## 2017-08-24 NOTE — ED Triage Notes (Signed)
Patient presents to ed via [facility] states she just finished dialysis and was back at [facility] c/o low blood pressure and generalized bodyaches. States she felt fine when she went to dialysis patient is sob however she is always sob. Patient has a graft in left upper thigh

## 2017-08-24 NOTE — ED Notes (Signed)
[facility] contacted for tx back to [facility]

## 2017-08-24 NOTE — ED Notes (Signed)
Contacted [facility] for confirmation of arrival.  "we are on the list"

## 2017-08-24 NOTE — ED Notes (Signed)
Patient transported to X-ray 

## 2017-08-24 NOTE — Discharge Instructions (Signed)
Please read and follow all provided instructions.  Your diagnoses today include:  1. Weakness   2. AF (paroxysmal atrial fibrillation) ([place])    Tests performed today include:  EKG - showed you had an irregular heart rate on arrival, then improved  Blood counts and electrolytes -no problems  Chest x-ray -no nfection  Vital signs. See below for your results today.   Medications prescribed:   None  Take any prescribed medications only as directed.  Home care instructions:  Follow any educational materials contained in this packet.  BE VERY CAREFUL not to take multiple medicines containing Tylenol (also called acetaminophen). Doing so can lead to an overdose which can damage your liver and cause liver failure and possibly death.   Follow-up instructions: Atrial fibrillation clinic --call office tomorrow to schedule a follow-up appointment.  This is important to get their input to help prevent stroke.  Please call your doctor and schedule a follow-up appointment for recheck in the next 3 days.  Please follow-up with your gastroenterologist, Dr. Watt Climes, as planned.  Return instructions:   Please return to the Emergency Department if you experience worsening symptoms.   Return if you have chest pain, worsening shortness of breath, passing out spells  Please return if you have any other emergent concerns.  Additional Information:  Your vital signs today were: BP (!) 145/63    Pulse 79    Temp 98.3 F (36.8 C) (Oral)    Resp (!) 27    Ht 5\' 4"  (1.626 m)    Wt 74.8 kg (165 lb)    SpO2 100%    BMI 28.32 kg/m  If your blood pressure (BP) was elevated above 135/85 this visit, please have this repeated by your doctor within one month. --------------

## 2017-08-24 NOTE — ED Provider Notes (Signed)
[facility] EMERGENCY DEPARTMENT Provider Note   CSN: [id number] Arrival date & time: 08/24/17  1419     History   Chief Complaint Chief Complaint  Patient presents with  . Weakness    HPI Stefanie [name] is a 72 y.o. female.  Patient with history of end-stage renal disease, history of CHF, history of diabetes -- currently residing at [facility], presents to the emergency department with complaint of weakness.  Patient states that she had a routine dialysis session today.  She states that she got on the bus to go back to her facility and felt very weak like her blood pressure was low.  She states that she did not feel spinning or like she was going to pass out.  She did not have any chest pain or shortness of breath.  She just did not feel well.  Upon returning, EMS was called for transport to the hospital.  Patient states that she currently feels back to her baseline.  She denies any recent fevers or URI symptoms.  No chest pain or shortness of breath.  No abdominal pain or vomiting.  Patient states that she did have 2 to 3 weeks of nausea and diarrhea which resolved about 2 days ago.  Patient was admitted for a week in 07/2017 for intractable vomiting.  She had possible aspiration pneumonia and an EGD performed showing severe reflux esophagitis.  She was placed on PPI for this.  No lower extremity swelling or rashes. Patient denies signs of stroke including: facial droop, slurred speech, aphasia, weakness/numbness in extremities, imbalance/trouble walking.  Patient's son is at bedside and states that mother has seemed like her normal self since his arrival here.      Past Medical History:  Diagnosis Date  . Anemia   . CAD (coronary artery disease)   . CHF (congestive heart failure) ([place])   . Constipation   . CVA (cerebral vascular accident) ([place]) [place]   left sided weakness  . Diabetes mellitus   . End stage renal disease ([place])    initiated HD 2006  .  Hx of cardiovascular stress test    a. MV 03/2010:  no ischemia, EF 55%  . Hx of echocardiogram    a. Echo 03/2010: EF 55-60%, Gr 1 diast dysfn, trivial MS, mild to mod LAE, PASP 34, ;  b. Echo 11/13: mild LVH, EF 60%, Gr 1 diast dysfn, Ao sclerosis, no AS, MAC, slight MS, mean 3 mmHg, PASP 34  . Hyperlipidemia   . Hypertension   . Protein malnutrition ([place])   . Renal insufficiency   . Retinal detachment, bilateral   . Secondary hyperparathyroidism [facility])     Patient Active Problem List   Diagnosis Date Noted  . Nausea & vomiting 07/30/2017  . Acute blood loss anemia 02/09/2016  . Hypotension due to blood loss 02/09/2016  . Chronic combined systolic and diastolic CHF (congestive heart failure) ([place]) 02/09/2016  . Hemorrhage 02/08/2016  . Anemia 02/08/2016  . Left leg pain 04/10/2015  . Bacteremia 04/10/2015  . Lactic acidosis   . Cellulitis 04/09/2015  . History of CVA with residual deficit 02/11/2015  . Weakness generalized 02/08/2015  . DVT (deep venous thrombosis) ([place])   . CHF (congestive heart failure) ([place])   . Chronic diastolic congestive heart failure ([place])   . ESRD (end stage renal disease) ([place])   . Congestive dilated cardiomyopathy ([place])   . Left leg DVT ([place]) 02/01/2015  . Leukocytosis 02/01/2015  .  Elevated troponin 02/01/2015  . SOB (shortness of breath) 02/01/2015  . Type 2 diabetes, controlled, with neuropathy ([place]) 12/20/2014  . Pain due to onychomycosis of toenail 12/20/2014  . 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 Mechanical complication of other vascular device, implant, and graft 05/19/2013  . PAD (peripheral artery disease) ([place]) 02/24/2012  . Murmur 12/24/2011  . Bradycardia-intermittent sinus 07/10/2010  . DYSPNEA ON EXERTION 02/25/2010  . CHEST PAIN, EXERTIONAL 02/25/2010  . DM 02/24/2010  . PROTEIN MALNUTRITION 02/24/2010  . HLD (hyperlipidemia) 02/24/2010  . ANEMIA 02/24/2010  . Essential hypertension 02/24/2010  . Cerebral artery occlusion  with cerebral infarction ([place]) 02/24/2010  . ESRD on dialysis ([place]) 02/24/2010  . Secondary renal hyperparathyroidism ([place]) 02/24/2010    Past Surgical History:  Procedure Laterality Date  . ARTERIOVENOUS GRAFT PLACEMENT     BUE numerous times  . ARTERIOVENOUS GRAFT PLACEMENT  11/13/10   Lt femoral - Dr. Kellie Simmering  . BIOPSY  08/03/2017   Procedure: BIOPSY;  Surgeon: Clarene Essex, MD;  Location: [facility];  Service: Endoscopy;;  . BRONCHIAL BRUSHINGS  08/03/2017   Procedure: Esophageal  BRUSHINGS;  Surgeon: Clarene Essex, MD;  Location: [facility];  Service: Endoscopy;;  esophageal brushing  . CARDIAC CATHETERIZATION N/A 02/05/2015   Procedure: Left Heart Cath and Coronary Angiography;  Surgeon: Jettie Booze, MD;  Location: [place] [facility];  Service: Cardiovascular;  Laterality: N/A;  . CATARACT EXTRACTION    . DG AV DIALYSIS GRAFT DECLOT OR  06/06/11, 08/26/11   performed in IR  . ESOPHAGOGASTRODUODENOSCOPY N/A 08/03/2017   Procedure: ESOPHAGOGASTRODUODENOSCOPY (EGD);  Surgeon: Clarene Essex, MD;  Location: [facility];  Service: Endoscopy;  Laterality: N/A;  . PERIPHERAL VASCULAR CATHETERIZATION N/A 02/06/2015   Procedure: A/V Shuntogram;  Surgeon: Elam Dutch, MD;  Location: [facility] CV LAB;  Service: Cardiovascular;  Laterality: N/A;  . PERIPHERAL VASCULAR CATHETERIZATION Left 02/26/2016   Procedure: A/V Shuntogram;  Surgeon: Waynetta Sandy, MD;  Location: [facility] LAB;  Service: Cardiovascular;  Laterality: Left;  . PERIPHERAL VASCULAR CATHETERIZATION Left 02/26/2016   Procedure: Peripheral Vascular Balloon Angioplasty;  Surgeon: Waynetta Sandy, MD;  Location: [place] CV LAB;  Service: Cardiovascular;  Laterality: Left;  Lt thigh avf  . SHUNTOGRAM N/A 05/29/2013   Procedure: Earney Mallet;  Surgeon: Conrad [place], MD;  Location: [facility] CATH LAB;  Service: Cardiovascular;  Laterality: N/A;  . SHUNTOGRAM Left 03/01/2014   Procedure: Earney Mallet;   Surgeon: Conrad [place], MD;  Location: [facility] CATH LAB;  Service: Cardiovascular;  Laterality: Left;  . TUBAL LIGATION       OB History   None      Home Medications    Prior to Admission medications   Medication Sig Start Date End Date Taking? Authorizing Provider  acetaminophen (TYLENOL) 325 MG tablet Take 650 mg by mouth 2 (two) times daily.    [provider]  b complex-vitamin c-folic acid (NEPHRO-VITE) 0.8 MG TABS tablet Take 1 tablet by mouth See admin instructions. Every Monday, Wednesday and Friday    [provider]  calcium acetate (PHOSLO) 667 MG capsule Take 667-2,001 mg by mouth See admin instructions. Take 3 capsules three times a day with meals and take 1 capsule twice daily with snacks    [provider]  cinacalcet (SENSIPAR) 30 MG tablet Take 1 tablet (30 mg total) by mouth Every Tuesday,Thursday,and Saturday with dialysis. 08/05/17   Hosie Poisson, MD  Darbepoetin Alfa (ARANESP) 100 MCG/0.5ML SOSY injection Inject  0.5 mLs (100 mcg total) into the vein every Thursday with hemodialysis. 08/05/17   Hosie Poisson, MD  diclofenac sodium (VOLTAREN) 1 % GEL Apply 2 g topically See admin instructions. Apply to both knees daily as needed for pain and to right hand 2-3 times a day as needed for pain (BRAND NAME VOLTAREN)    [provider]  glipiZIDE (GLUCOTROL) 10 MG tablet Take 10 mg by mouth 2 (two) times daily before a meal.     [provider]  latanoprost (XALATAN) 0.005 % ophthalmic solution Place 1 drop into the left eye at bedtime.    [provider]  lidocaine-prilocaine (EMLA) cream Apply 1 application topically as needed (topical anesthesia for hemodialysis if Gebauers and Lidocaine injection are ineffective.). Patient taking differently: Apply 1 application topically 3 (three) times a week. Tuesday, Thursday and saturday 02/25/15   Bary Leriche, PA-C  loratadine (CLARITIN) 10 MG tablet Take 10 mg by mouth daily.     [provider]  Melatonin 5 MG TABS Take 5 mg by mouth at bedtime.    [provider]  neomycin-bacitracin-polymyxin (NEOSPORIN) ointment Apply 1 application topically as needed for wound care.    [provider]  neomycin-polymyxin b-dexamethasone (MAXITROL) 3.5-10000-0.1 SUSP Place 1 drop into the right eye 2 (two) times daily. 07/12/17   [provider]  oxyCODONE (OXY IR/ROXICODONE) 5 MG immediate release tablet Take 5 mg by mouth 2 (two) times daily.     [provider]  pantoprazole (PROTONIX) 40 MG tablet Take 1 tablet (40 mg total) by mouth 2 (two) times daily. 08/05/17   Hosie Poisson, MD  polyethylene glycol [facility]) packet Use 17 gms three times daily until bowels move, with maximum of 3 consecutive days Patient taking differently: Take 17 g by mouth See admin instructions. On Tuesday, Thursday and Saturday as needed 09/02/15   Charlann Lange, PA-C  pravastatin (PRAVACHOL) 20 MG tablet Take 20 mg by mouth at bedtime.    [provider]  rOPINIRole (REQUIP) 1 MG tablet Take 1 mg by mouth at bedtime.     [provider]  sennosides-docusate sodium (SENOKOT-S) 8.6-50 MG tablet Take 2 tablets by mouth daily as needed for constipation.     [provider]  sorbitol 70 % solution Take 30 mLs by mouth daily as needed (for constipation).    [provider]  traZODone (DESYREL) 50 MG tablet Take 25 mg by mouth as needed.  07/06/17   [provider]    Family History Family History  Problem Relation Age of Onset  . Heart disease Mother   . Diabetes Mother   . Hypertension Mother   . Heart attack Mother   . Diabetes Other   . Kidney disease Other   . Cardiomyopathy Unknown   . Diabetes Daughter   . Diabetes Son   . Hypertension Son     Social History Social History   Tobacco Use  . Smoking status: Former Smoker    Last attempt to quit: 03/17/1983    Years since quitting: 34.4  . Smokeless  tobacco: Never Used  Substance Use Topics  . Alcohol use: No  . Drug use: No     Allergies   Patient has no known allergies.   Review of Systems Review of Systems  Constitutional: Positive for fatigue. Negative for fever.  HENT: Negative for rhinorrhea and sore throat.   Eyes: Negative for redness.  Respiratory: Negative for cough.   Cardiovascular: Negative for  chest pain.  Gastrointestinal: Negative for abdominal pain, diarrhea, nausea and vomiting.  Genitourinary: Negative for dysuria.  Musculoskeletal: Negative for myalgias.  Skin: Negative for rash.  Neurological: Positive for weakness. Negative for headaches.     Physical Exam Updated Vital Signs BP (!) 106/49   Pulse 93   Temp 98.3 F (36.8 C) (Oral)   Resp 18   Ht 5' 4"  (1.626 m)   Wt 74.8 kg (165 lb)   SpO2 96%   BMI 28.32 kg/m   Physical Exam  Constitutional: She appears well-developed and well-nourished.  HENT:  Head: Normocephalic and atraumatic.  Eyes: Conjunctivae are normal. Right eye exhibits no discharge. Left eye exhibits no discharge.  Neck: Normal range of motion. Neck supple.  Cardiovascular: Normal rate, regular rhythm and normal heart sounds.  Pulmonary/Chest: Effort normal and breath sounds normal. No stridor. Tachypnea (Patient and son say this has been for a while) noted. She has no decreased breath sounds. She has no wheezes. She has no rhonchi. She has no rales.  Abdominal: Soft. There is no tenderness.  Neurological: She is alert.  Skin: Skin is warm and dry.  Psychiatric: She has a normal mood and affect.  Nursing note and vitals reviewed.    ED Treatments / Results  Labs (all labs ordered are listed, but only abnormal results are displayed) Labs Reviewed  CBC WITH DIFFERENTIAL/PLATELET - Abnormal; Notable for the following components:      Result Value   RBC 2.54 (*)    Hemoglobin 8.2 (*)    HCT 27.1 (*)    MCV 106.7 (*)    RDW 17.4 (*)    Platelets 126 (*)    All other  components within normal limits  BASIC METABOLIC PANEL - Abnormal; Notable for the following components:   Potassium 3.3 (*)    Chloride 100 (*)    Glucose, Bld 201 (*)    Creatinine, Ser 3.82 (*)    Calcium 8.3 (*)    GFR calc non Af Amer 11 (*)    GFR calc Af Amer 13 (*)    All other components within normal limits    EKG EKG Interpretation  Date/Time:  Tuesday August 24 2017 17:54:00 EDT Ventricular Rate:  78 PR Interval:    QRS Duration: 107 QT Interval:  410 QTC Calculation: 467 R Axis:   41 Text Interpretation:  Sinus rhythm Ventricular bigeminy Borderline repolarization abnormality Confirmed by Malvin Johns [phone]) on 08/24/2017 6:37:22 PM   Radiology Dg Chest 2 View  Result Date: 08/24/2017 CLINICAL DATA:  Shortness of breath and weakness. EXAM: CHEST - 2 VIEW COMPARISON:  08/01/2017. FINDINGS: Linear metallic foreign body projecting over the medial right lung apex is again noted and appears unchanged from the previous exam. Stable cardiac enlargement. Pulmonary vascular congestion is identified which appears unchanged from prior study. No superimposed airspace consolidation. The visualized skeletal structures are unremarkable. IMPRESSION: 1. Stable cardiac enlargement and pulmonary vascular congestion. Electronically Signed   By: Kerby Moors M.D.   On: 08/24/2017 16:45    Procedures Procedures (including critical care time)  Medications Ordered in ED Medications - No data to display   Initial Impression / Assessment and Plan / ED Course  I have reviewed the triage vital signs and the nursing notes.  Pertinent labs & imaging results that were available during my care of the patient were reviewed by me and considered in my medical decision making (see chart for details).     Patient seen and  examined. Work-up initiated.  Patient appears well.  States that she feels back to baseline.  Will check labs and a chest x-ray.  Initial EKG shows afib, no previous history  of such per records.   Vital signs reviewed and are as follows: BP (!) 106/49   Pulse 93   Temp 98.3 F (36.8 C) (Oral)   Resp 18   Ht 5' 4"  (1.626 m)   Wt 74.8 kg (165 lb)   SpO2 96%   BMI 28.32 kg/m   Reviewed previous hospital admission notes.  Patient with severe reflux esophagitis.  She was placed on PPI.  She is to follow-up with GI for consideration of repeat endoscopy to ensure healing.  8:12 PM patient discussed with and seen by Dr. Tamera Punt.  Labs without any acute findings.  Chest x-ray is clear.  Patient wants to go home.  She is spitting occasionally into a bag.  She states that she has been having trouble drinking thin liquids such as water and has been doing better at her assisted living facility with [facility].  Regarding weakness, possible hypertension: Blood pressures are improved during ED stay and patient is back at her baseline.  Regarding atrial fibrillation, new onset, brief episode here.  Patient has elevated CHADs2Vasc score.  Patient will be referred to A. fib clinic for further evaluation and treatment.  She has history of CVA, but is not currently anticoagulated. Will leave this decision up to her doctors and afib clinic given patient's extensive past history, would not feel comfortable unilaterally starting this tonight.  Discussed importance of follow-up with patient.   This patients CHA2DS2-VASc Score and unadjusted Ischemic Stroke Rate (% per year) is equal to 11.2 % stroke rate/year from a score of 7.   Above score calculated as 1 point each if present [CHF, HTN, DM, Vascular=MI/PAD/Aortic Plaque, Age if 65-74, or Female] Above score calculated as 2 points each if present [Age > 75, or Stroke/TIA/TE]  Regarding swallowing difficulty, this has been ongoing for patient.  She has been evaluated by GI, given recommendations and has appropriate follow-up.  Final Clinical Impressions(s) / ED Diagnoses   Final diagnoses:  Weakness  AF (paroxysmal atrial  fibrillation) [facility])   Patient with plan as above.  Patient had episode of weakness today described by patient has if her blood pressure was low.  She denies any near syncope or syncopal spells.  This was right after her dialysis session today.  Work-up in ED revealed paroxysmal A. fib, possibly new onset.  This has not recurred during ED stay.  Patient has had normal blood pressures and is back at her baseline.  She has several chronic issues as above.  Plan as above.  Patient is doing well and states that she wants to go home.  ED Discharge Orders    None       Carlisle Cater, Hershal Coria 08/24/17 2021    Malvin Johns, MD 08/24/17 2030

## 2017-08-24 NOTE — ED Notes (Signed)
RN called Labs, Labs are about to be processed

## 2017-09-01 ENCOUNTER — Ambulatory Visit (HOSPITAL_COMMUNITY)
Admission: RE | Admit: 2017-09-01 | Discharge: 2017-09-01 | Disposition: A | Discharge status: Home / Self Care | Payer: Medicare Other | Source: Ambulatory Visit | Attending: Nurse Practitioner | Admitting: Nurse Practitioner

## 2017-09-01 ENCOUNTER — Encounter (HOSPITAL_COMMUNITY): Payer: Self-pay | Admitting: Nurse Practitioner

## 2017-09-01 VITALS — BP 146/74 | HR 63 | Ht 64.0 in | Wt 162.6 lb

## 2017-09-01 DIAGNOSIS — Z8673 Personal history of transient ischemic attack (TIA), and cerebral infarction without residual deficits: Secondary | ICD-10-CM | POA: Insufficient documentation

## 2017-09-01 DIAGNOSIS — Z7984 Long term (current) use of oral hypoglycemic drugs: Secondary | ICD-10-CM | POA: Diagnosis not present

## 2017-09-01 DIAGNOSIS — E1122 Type 2 diabetes mellitus with diabetic chronic kidney disease: Secondary | ICD-10-CM | POA: Insufficient documentation

## 2017-09-01 DIAGNOSIS — Z992 Dependence on renal dialysis: Secondary | ICD-10-CM | POA: Diagnosis not present

## 2017-09-01 DIAGNOSIS — I480 Paroxysmal atrial fibrillation: Secondary | ICD-10-CM | POA: Insufficient documentation

## 2017-09-01 DIAGNOSIS — I132 Hypertensive heart and chronic kidney disease with heart failure and with stage 5 chronic kidney disease, or end stage renal disease: Secondary | ICD-10-CM | POA: Insufficient documentation

## 2017-09-01 DIAGNOSIS — Z79899 Other long term (current) drug therapy: Secondary | ICD-10-CM | POA: Diagnosis not present

## 2017-09-01 DIAGNOSIS — I509 Heart failure, unspecified: Secondary | ICD-10-CM | POA: Insufficient documentation

## 2017-09-01 DIAGNOSIS — I4581 Long QT syndrome: Secondary | ICD-10-CM | POA: Insufficient documentation

## 2017-09-01 DIAGNOSIS — Z87891 Personal history of nicotine dependence: Secondary | ICD-10-CM | POA: Insufficient documentation

## 2017-09-01 DIAGNOSIS — N2581 Secondary hyperparathyroidism of renal origin: Secondary | ICD-10-CM | POA: Insufficient documentation

## 2017-09-01 DIAGNOSIS — I2510 Atherosclerotic heart disease of native coronary artery without angina pectoris: Secondary | ICD-10-CM | POA: Insufficient documentation

## 2017-09-01 DIAGNOSIS — I10 Essential (primary) hypertension: Secondary | ICD-10-CM

## 2017-09-01 DIAGNOSIS — E785 Hyperlipidemia, unspecified: Secondary | ICD-10-CM | POA: Insufficient documentation

## 2017-09-01 DIAGNOSIS — N186 End stage renal disease: Secondary | ICD-10-CM | POA: Insufficient documentation

## 2017-09-01 NOTE — Progress Notes (Signed)
Primary Care Physician: Raelyn Number, MD Primary Cardiologist: Fletcher Anon Primary Electrophysiologist: Caryl Comes Referring Physician: ER   Stefanie [name] is a 72 y.o. female with newly diagnosed atrial fibrillation who presents for consultation in the [facility].  The patient was initially diagnosed with atrial fibrillation at recent ER visit after presenting with symptoms of weakness following dialysis.  She lives in [place] but is here alone today without anyone from facility or family. Her daughter was unable to get off work.  She is a poor historian and is in a wheelchair.  She has not had palpitations and did not have palpitations in the ER.  She has ESRD on HD, diabetes, hypertension, and prior stroke.    Today, she denies symptoms of palpitations, chest pain, shortness of breath, orthopnea, PND, lower extremity edema, dizziness, presyncope, syncope, snoring, daytime somnolence, bleeding, or neurologic sequela. The patient is tolerating medications without difficulties and is otherwise without complaint today.    Atrial Fibrillation Risk Factors:  she does not have symptoms or diagnosis of sleep apnea.  she does not have a history of rheumatic fever.  she does not have a history of alcohol use.  she has a BMI of Body mass index is 27.91 kg/m.Marland Kitchen Filed Weights   09/01/17 0927  Weight: 162 lb 9.6 oz (73.8 kg)    LA size: 36   Atrial Fibrillation Management history:  Previous antiarrhythmic drugs: none  Previous cardioversions: none  Previous ablations: none  CHADS2VASC score: 7  Anticoagulation history: none   Past Medical History:  Diagnosis Date  . Anemia   . CAD (coronary artery disease)   . CHF (congestive heart failure) ([place])   . Constipation   . CVA (cerebral vascular accident) ([place]) [place]   left sided weakness  . Diabetes mellitus   . End stage renal disease ([place])    initiated HD 2006  . Hx of cardiovascular stress test    a. MV 03/2010:  no ischemia, EF 55%  . Hx of echocardiogram    a. Echo 03/2010: EF 55-60%, Gr 1 diast dysfn, trivial MS, mild to mod LAE, PASP 34, ;  b. Echo 11/13: mild LVH, EF 60%, Gr 1 diast dysfn, Ao sclerosis, no AS, MAC, slight MS, mean 3 mmHg, PASP 34  . Hyperlipidemia   . Hypertension   . Protein malnutrition ([place])   . Renal insufficiency   . Retinal detachment, bilateral   . Secondary hyperparathyroidism ([place])    Past Surgical History:  Procedure Laterality Date  . ARTERIOVENOUS GRAFT PLACEMENT     BUE numerous times  . ARTERIOVENOUS GRAFT PLACEMENT  11/13/10   Lt femoral - Dr. Kellie Simmering  . BIOPSY  08/03/2017   Procedure: BIOPSY;  Surgeon: Clarene Essex, MD;  Location: [place];  Service: Endoscopy;;  . BRONCHIAL BRUSHINGS  08/03/2017   Procedure: Esophageal  BRUSHINGS;  Surgeon: Clarene Essex, MD;  Location: [place];  Service: Endoscopy;;  esophageal brushing  . CARDIAC CATHETERIZATION N/A 02/05/2015   Procedure: Left Heart Cath and Coronary Angiography;  Surgeon: Jettie Booze, MD;  Location: [place] [facility];  Service: Cardiovascular;  Laterality: N/A;  . CATARACT EXTRACTION    . DG AV DIALYSIS GRAFT DECLOT OR  06/06/11, 08/26/11   performed in IR  . ESOPHAGOGASTRODUODENOSCOPY N/A 08/03/2017   Procedure: ESOPHAGOGASTRODUODENOSCOPY (EGD);  Surgeon: Clarene Essex, MD;  Location: [facility];  Service: Endoscopy;  Laterality: N/A;  . PERIPHERAL VASCULAR CATHETERIZATION N/A 02/06/2015   Procedure: A/V  Shuntogram;  Surgeon: Elam Dutch, MD;  Location: [place] [facility];  Service: Cardiovascular;  Laterality: N/A;  . PERIPHERAL VASCULAR CATHETERIZATION Left 02/26/2016   Procedure: A/V Shuntogram;  Surgeon: Waynetta Sandy, MD;  Location: [facility];  Service: Cardiovascular;  Laterality: Left;  . PERIPHERAL VASCULAR CATHETERIZATION Left 02/26/2016   Procedure: Peripheral Vascular Balloon Angioplasty;  Surgeon: Waynetta Sandy, MD;  Location: [place] [facility];  Service: Cardiovascular;  Laterality: Left;  Lt thigh avf  . SHUNTOGRAM N/A 05/29/2013   Procedure: Earney Mallet;  Surgeon: Conrad Niagara, MD;  Location: [facility] CATH LAB;  Service: Cardiovascular;  Laterality: N/A;  . SHUNTOGRAM Left 03/01/2014   Procedure: Earney Mallet;  Surgeon: Conrad Laona, MD;  Location: [facility] CATH LAB;  Service: Cardiovascular;  Laterality: Left;  . TUBAL LIGATION      Current Outpatient Medications  Medication Sig Dispense Refill  . acetaminophen (TYLENOL) 325 MG tablet Take 650 mg by mouth 3 (three) times daily.     Marland Kitchen b complex-vitamin c-folic acid (NEPHRO-VITE) 0.8 MG TABS tablet Take 1 tablet by mouth See admin instructions. Every Monday, Wednesday and Friday    . calcium acetate (PHOSLO) 667 MG capsule Take 667-2,001 mg by mouth See admin instructions. Take 3 capsules three times a day with meals and take 1 capsule twice daily with snacks    . cinacalcet (SENSIPAR) 30 MG tablet Take 1 tablet (30 mg total) by mouth Every Tuesday,Thursday,and Saturday with dialysis. 60 tablet   . Darbepoetin Alfa (ARANESP) 100 MCG/0.5ML SOSY injection Inject 0.5 mLs (100 mcg total) into the vein every Thursday with hemodialysis. 4.2 mL   . diclofenac sodium (VOLTAREN) 1 % GEL Apply 2 g topically See admin instructions. Apply to both knees daily as needed for pain and to right hand 2-3 times a day as needed for pain (BRAND NAME VOLTAREN)    . glipiZIDE (GLUCOTROL) 10 MG tablet Take 10 mg by mouth 2 (two) times daily before a meal.     . latanoprost (XALATAN) 0.005 % ophthalmic solution Place 1 drop into both eyes at bedtime.     . lidocaine-prilocaine (EMLA) cream Apply 1 application topically as needed (topical anesthesia for hemodialysis if Gebauers and Lidocaine injection are ineffective.). 30 g 0  . loratadine (CLARITIN) 10 MG tablet Take 10 mg by mouth daily.    . Melatonin 5 MG TABS Take 5 mg by mouth at bedtime.    Marland Kitchen neomycin-bacitracin-polymyxin (NEOSPORIN) ointment Apply 1  application topically 2 (two) times daily as needed for wound care.     . pantoprazole (PROTONIX) 40 MG tablet Take 1 tablet (40 mg total) by mouth 2 (two) times daily. 60 tablet 2  . polyethylene glycol (MIRALAX) packet Use 17 gms three times daily until bowels move, with maximum of 3 consecutive days (Patient taking differently: Take 17 g by mouth 3 (three) times daily as needed for mild constipation, moderate constipation or severe constipation. ) 10 each 0  . pravastatin (PRAVACHOL) 20 MG tablet Take 20 mg by mouth at bedtime.    Marland Kitchen rOPINIRole (REQUIP) 1 MG tablet Take 1 mg by mouth at bedtime.     . sennosides-docusate sodium (SENOKOT-S) 8.6-50 MG tablet Take 2 tablets by mouth daily.     . sorbitol 70 % solution Take 30 mLs by mouth daily as needed (for constipation).    . traZODone (DESYREL) 50 MG tablet Take 25 mg by mouth at bedtime as needed for sleep.  No current facility-administered medications for this encounter.     No Known Allergies  Social History   Socioeconomic History  . Marital status: Divorced    Spouse name: Not on file  . Number of children: 5  . Years of education: Not on file  . Highest education level: Not on file  Occupational History    Employer: RETIRED  Social Needs  . Financial resource strain: Not on file  . Food insecurity:    Worry: Not on file    Inability: Not on file  . Transportation needs:    Medical: Not on file    Non-medical: Not on file  Tobacco Use  . Smoking status: Former Smoker    Last attempt to quit: 03/17/1983    Years since quitting: 34.4  . Smokeless tobacco: Never Used  Substance and Sexual Activity  . Alcohol use: No  . Drug use: No  . Sexual activity: Not on file  Lifestyle  . Physical activity:    Days per week: Not on file    Minutes per session: Not on file  . Stress: Not on file  Relationships  . Social connections:    Talks on phone: Not on file    Gets together: Not on file    Attends religious service:  Not on file    Active member of club or organization: Not on file    Attends meetings of clubs or organizations: Not on file    Relationship status: Not on file  . Intimate partner violence:    Fear of current or ex partner: Not on file    Emotionally abused: Not on file    Physically abused: Not on file    Forced sexual activity: Not on file  Other Topics Concern  . Not on file  Social History Narrative  . Not on file    Family History  Problem Relation Age of Onset  . Heart disease Mother   . Diabetes Mother   . Hypertension Mother   . Heart attack Mother   . Diabetes Other   . Kidney disease Other   . Cardiomyopathy Unknown   . Diabetes Daughter   . Diabetes Son   . Hypertension Son    The patient does not have a history of early familial atrial fibrillation or other arrhythmias.  ROS- All systems are reviewed and negative except as per the HPI above.  Physical Exam: Vitals:   09/01/17 0927  BP: (!) 146/74  Pulse: 63  SpO2: 99%  Weight: 162 lb 9.6 oz (73.8 kg)  Height: _0  (1.626 m)    GEN- The patient is elderly appearing, alert and oriented x 3 today, in a wheelchair, poor historian Head- normocephalic, atraumatic Eyes-  Sclera clear, conjunctiva pink Ears- hearing intact Oropharynx- clear Neck- supple  Lungs- Clear to ausculation bilaterally, normal work of breathing Heart- Irregular rate and rhythm GI- soft, NT, ND, + BS Extremities- no clubbing, cyanosis, or edema MS- no significant deformity or atrophy Skin- no rash or lesion Psych- euthymic mood, full affect Neuro- strength and sensation are intact  Wt Readings from Last 3 Encounters:  09/01/17 162 lb 9.6 oz (73.8 kg)  08/24/17 165 lb (74.8 kg)  08/05/17 165 lb 5.5 oz (75 kg)    EKG today demonstrates atrial fibrillation, rate 63, QTc 520mec No recent echo   Epic records are reviewed at length today  Assessment and Plan:  1. Newly diagnosed atrial fibrillation The patient has  asymptomatic newly diagnosed  atrial fibrillation. She had EKG's at her recent ER visit of SR and AF.   CHADS2VASC is 7 but she is high risk for anticoagulation with ESRD and gait instability.  Would like to get a clearer picture of AF burden and update echo.  Will obtain 30 day monitor and echo  Follow up in 6 weeks - she will have her daughter come to that appointment If she has significant AF burden on event monitor prior to follow up appointment, will need to consider earlier follow up.  2. HTN Stable No change required today  3. ESRD On HD  Follow up with AF clinic in 6 weeks  Chanetta Marshall, NP 09/01/2017 9:42 AM

## 2017-09-01 NOTE — Patient Instructions (Signed)
Scheduling will be in touch with you to set up cardiac monitoring as well as echocardiogram.

## 2017-09-08 ENCOUNTER — Ambulatory Visit (HOSPITAL_COMMUNITY): Payer: Medicare Other | Attending: Cardiology

## 2017-09-08 ENCOUNTER — Other Ambulatory Visit: Payer: Self-pay

## 2017-09-08 ENCOUNTER — Ambulatory Visit (INDEPENDENT_AMBULATORY_CARE_PROVIDER_SITE_OTHER): Payer: Medicare Other

## 2017-09-08 DIAGNOSIS — I480 Paroxysmal atrial fibrillation: Secondary | ICD-10-CM

## 2017-09-08 DIAGNOSIS — I10 Essential (primary) hypertension: Secondary | ICD-10-CM | POA: Insufficient documentation

## 2017-09-08 DIAGNOSIS — E119 Type 2 diabetes mellitus without complications: Secondary | ICD-10-CM | POA: Insufficient documentation

## 2017-09-08 DIAGNOSIS — Z8673 Personal history of transient ischemic attack (TIA), and cerebral infarction without residual deficits: Secondary | ICD-10-CM | POA: Diagnosis not present

## 2017-09-08 DIAGNOSIS — I2510 Atherosclerotic heart disease of native coronary artery without angina pectoris: Secondary | ICD-10-CM | POA: Insufficient documentation

## 2017-09-10 ENCOUNTER — Encounter (HOSPITAL_COMMUNITY): Payer: Self-pay | Admitting: *Deleted

## 2017-10-08 ENCOUNTER — Ambulatory Visit: Payer: Medicare Other | Admitting: Physical Therapy

## 2017-10-13 ENCOUNTER — Ambulatory Visit (HOSPITAL_COMMUNITY)
Admission: RE | Admit: 2017-10-13 | Discharge: 2017-10-13 | Disposition: A | Discharge status: Home / Self Care | Payer: Medicare Other | Source: Ambulatory Visit | Attending: Nurse Practitioner | Admitting: Nurse Practitioner

## 2017-10-13 ENCOUNTER — Encounter (HOSPITAL_COMMUNITY): Payer: Self-pay | Admitting: Nurse Practitioner

## 2017-10-13 VITALS — BP 140/62 | HR 67 | Ht 64.0 in | Wt 172.0 lb

## 2017-10-13 DIAGNOSIS — E785 Hyperlipidemia, unspecified: Secondary | ICD-10-CM | POA: Diagnosis not present

## 2017-10-13 DIAGNOSIS — I480 Paroxysmal atrial fibrillation: Secondary | ICD-10-CM

## 2017-10-13 DIAGNOSIS — I4891 Unspecified atrial fibrillation: Secondary | ICD-10-CM | POA: Insufficient documentation

## 2017-10-13 DIAGNOSIS — Z9889 Other specified postprocedural states: Secondary | ICD-10-CM | POA: Insufficient documentation

## 2017-10-13 DIAGNOSIS — N2581 Secondary hyperparathyroidism of renal origin: Secondary | ICD-10-CM | POA: Insufficient documentation

## 2017-10-13 DIAGNOSIS — I2510 Atherosclerotic heart disease of native coronary artery without angina pectoris: Secondary | ICD-10-CM | POA: Insufficient documentation

## 2017-10-13 DIAGNOSIS — E1122 Type 2 diabetes mellitus with diabetic chronic kidney disease: Secondary | ICD-10-CM | POA: Diagnosis not present

## 2017-10-13 DIAGNOSIS — N186 End stage renal disease: Secondary | ICD-10-CM | POA: Insufficient documentation

## 2017-10-13 DIAGNOSIS — Z87891 Personal history of nicotine dependence: Secondary | ICD-10-CM | POA: Insufficient documentation

## 2017-10-13 DIAGNOSIS — I132 Hypertensive heart and chronic kidney disease with heart failure and with stage 5 chronic kidney disease, or end stage renal disease: Secondary | ICD-10-CM | POA: Insufficient documentation

## 2017-10-13 DIAGNOSIS — Z992 Dependence on renal dialysis: Secondary | ICD-10-CM | POA: Diagnosis not present

## 2017-10-13 DIAGNOSIS — Z79899 Other long term (current) drug therapy: Secondary | ICD-10-CM | POA: Diagnosis not present

## 2017-10-13 DIAGNOSIS — Z794 Long term (current) use of insulin: Secondary | ICD-10-CM | POA: Insufficient documentation

## 2017-10-13 NOTE — Progress Notes (Signed)
Primary Care Physician: Raelyn Number, MD Primary Cardiologist: Fletcher Anon Primary Electrophysiologist: Caryl Comes Referring Physician: ER   Stefanie [name] is a 72 y.o. female with newly diagnosed atrial fibrillation who presents for f/u in the [facility]. The patient was initially diagnosed with atrial fibrillation at recent ER visit after presenting with symptoms of weakness following dialysis.  She lives in [place] but is here alone today without anyone from facility or family.  She is a poor historian and is in a wheelchair.  She had an updated echo with continues to show moderately reduced EF. She did wear a 30 day monitor but the results are not in to discuss with pt. She has no complaints today and EKG shows SR. and She has ESRD on HD, diabetes, hypertension, and prior stroke.    Today, she denies symptoms of palpitations, chest pain, shortness of breath, orthopnea, PND, lower extremity edema, dizziness, presyncope, syncope, snoring, daytime somnolence, bleeding, or neurologic sequela. The patient is tolerating medications without difficulties and is otherwise without complaint today.    Atrial Fibrillation Risk Factors:  she does not have symptoms or diagnosis of sleep apnea.  she does not have a history of rheumatic fever.  she does not have a history of alcohol use.  she has a BMI of Body mass index is 29.52 kg/m.Marland Kitchen Filed Weights   10/13/17 1212  Weight: 172 lb (78 kg)    LA size: 36   Atrial Fibrillation Management history:  Previous antiarrhythmic drugs: none  Previous cardioversions: none  Previous ablations: none  CHADS2VASC score: 7  Anticoagulation history: none   Past Medical History:  Diagnosis Date  . Anemia   . CAD (coronary artery disease)   . CHF (congestive heart failure) ([place])   . Constipation   . CVA (cerebral vascular accident) ([place]) [place]   left sided weakness  . Diabetes mellitus   . End stage renal disease  ([place])    initiated HD 2006  . Hx of cardiovascular stress test    a. MV 03/2010:  no ischemia, EF 55%  . Hx of echocardiogram    a. Echo 03/2010: EF 55-60%, Gr 1 diast dysfn, trivial MS, mild to mod LAE, PASP 34, ;  b. Echo 11/13: mild LVH, EF 60%, Gr 1 diast dysfn, Ao sclerosis, no AS, MAC, slight MS, mean 3 mmHg, PASP 34  . Hyperlipidemia   . Hypertension   . Protein malnutrition ([place])   . Renal insufficiency   . Retinal detachment, bilateral   . Secondary hyperparathyroidism ([place])    Past Surgical History:  Procedure Laterality Date  . ARTERIOVENOUS GRAFT PLACEMENT     BUE numerous times  . ARTERIOVENOUS GRAFT PLACEMENT  11/13/10   Lt femoral - Dr. Kellie Simmering  . BIOPSY  08/03/2017   Procedure: BIOPSY;  Surgeon: Clarene Essex, MD;  Location: [facility];  Service: Endoscopy;;  . BRONCHIAL BRUSHINGS  08/03/2017   Procedure: Esophageal  BRUSHINGS;  Surgeon: Clarene Essex, MD;  Location: [place];  Service: Endoscopy;;  esophageal brushing  . CARDIAC CATHETERIZATION N/A 02/05/2015   Procedure: Left Heart Cath and Coronary Angiography;  Surgeon: Jettie Booze, MD;  Location: [facility];  Service: Cardiovascular;  Laterality: N/A;  . CATARACT EXTRACTION    . DG AV DIALYSIS GRAFT DECLOT OR  06/06/11, 08/26/11   performed in IR  . ESOPHAGOGASTRODUODENOSCOPY N/A 08/03/2017   Procedure: ESOPHAGOGASTRODUODENOSCOPY (EGD);  Surgeon: Clarene Essex, MD;  Location: [facility];  Service:  Endoscopy;  Laterality: N/A;  . PERIPHERAL VASCULAR CATHETERIZATION N/A 02/06/2015   Procedure: A/V Shuntogram;  Surgeon: Elam Dutch, MD;  Location: [place] [facility];  Service: Cardiovascular;  Laterality: N/A;  . PERIPHERAL VASCULAR CATHETERIZATION Left 02/26/2016   Procedure: A/V Shuntogram;  Surgeon: Waynetta Sandy, MD;  Location: [place] [facility];  Service: Cardiovascular;  Laterality: Left;  . PERIPHERAL VASCULAR CATHETERIZATION Left 02/26/2016   Procedure: Peripheral Vascular Balloon  Angioplasty;  Surgeon: Waynetta Sandy, MD;  Location: [place] [facility];  Service: Cardiovascular;  Laterality: Left;  Lt thigh avf  . SHUNTOGRAM N/A 05/29/2013   Procedure: Earney Mallet;  Surgeon: Conrad Hato Arriba, MD;  Location: [facility] CATH LAB;  Service: Cardiovascular;  Laterality: N/A;  . SHUNTOGRAM Left 03/01/2014   Procedure: Earney Mallet;  Surgeon: Conrad Cohoes, MD;  Location: [facility] CATH LAB;  Service: Cardiovascular;  Laterality: Left;  . TUBAL LIGATION      Current Outpatient Medications  Medication Sig Dispense Refill  . acetaminophen (TYLENOL) 325 MG tablet Take 650 mg by mouth 3 (three) times daily.     Marland Kitchen b complex-vitamin c-folic acid (NEPHRO-VITE) 0.8 MG TABS tablet Take 1 tablet by mouth See admin instructions. Every Monday, Wednesday and Friday    . calcium acetate (PHOSLO) 667 MG capsule Take 667-2,001 mg by mouth See admin instructions. Take 3 capsules three times a day with meals and take 1 capsule twice daily with snacks    . cinacalcet (SENSIPAR) 30 MG tablet Take 1 tablet (30 mg total) by mouth Every Tuesday,Thursday,and Saturday with dialysis. 60 tablet   . Darbepoetin Alfa (ARANESP) 100 MCG/0.5ML SOSY injection Inject 0.5 mLs (100 mcg total) into the vein every Thursday with hemodialysis. 4.2 mL   . diclofenac sodium (VOLTAREN) 1 % GEL Apply 2 g topically See admin instructions. Apply to both knees daily as needed for pain and to right hand 2-3 times a day as needed for pain (BRAND NAME VOLTAREN)    . glipiZIDE (GLUCOTROL) 10 MG tablet Take 10 mg by mouth 2 (two) times daily before a meal.     . latanoprost (XALATAN) 0.005 % ophthalmic solution Place 1 drop into both eyes at bedtime.     Marland Kitchen LEVEMIR FLEXTOUCH 100 UNIT/ML Pen 10 Units at bedtime.    . lidocaine-prilocaine (EMLA) cream Apply 1 application topically as needed (topical anesthesia for hemodialysis if Gebauers and Lidocaine injection are ineffective.). 30 g 0  . loratadine (CLARITIN) 10 MG tablet Take 10 mg by mouth  daily.    . Melatonin 5 MG TABS Take 5 mg by mouth at bedtime.    Marland Kitchen neomycin-bacitracin-polymyxin (NEOSPORIN) ointment Apply 1 application topically 2 (two) times daily as needed for wound care.     . pantoprazole (PROTONIX) 40 MG tablet Take 1 tablet (40 mg total) by mouth 2 (two) times daily. 60 tablet 2  . polyethylene glycol (MIRALAX) packet Use 17 gms three times daily until bowels move, with maximum of 3 consecutive days (Patient taking differently: Take 17 g by mouth 3 (three) times daily as needed for mild constipation, moderate constipation or severe constipation. ) 10 each 0  . pravastatin (PRAVACHOL) 20 MG tablet Take 20 mg by mouth at bedtime.    Marland Kitchen rOPINIRole (REQUIP) 1 MG tablet Take 1 mg by mouth at bedtime.     . sennosides-docusate sodium (SENOKOT-S) 8.6-50 MG tablet Take 2 tablets by mouth daily.     . sorbitol 70 % solution Take 30 mLs by mouth  daily as needed (for constipation).    . traZODone (DESYREL) 50 MG tablet Take 25 mg by mouth at bedtime as needed for sleep.      No current facility-administered medications for this encounter.     No Known Allergies  Social History   Socioeconomic History  . Marital status: Divorced    Spouse name: Not on file  . Number of children: 5  . Years of education: Not on file  . Highest education level: Not on file  Occupational History    Employer: RETIRED  Social Needs  . Financial resource strain: Not on file  . Food insecurity:    Worry: Not on file    Inability: Not on file  . Transportation needs:    Medical: Not on file    Non-medical: Not on file  Tobacco Use  . Smoking status: Former Smoker    Last attempt to quit: 03/17/1983    Years since quitting: 34.6  . Smokeless tobacco: Never Used  Substance and Sexual Activity  . Alcohol use: No  . Drug use: No  . Sexual activity: Not on file  Lifestyle  . Physical activity:    Days per week: Not on file    Minutes per session: Not on file  . Stress: Not on file    Relationships  . Social connections:    Talks on phone: Not on file    Gets together: Not on file    Attends religious service: Not on file    Active member of club or organization: Not on file    Attends meetings of clubs or organizations: Not on file    Relationship status: Not on file  . Intimate partner violence:    Fear of current or ex partner: Not on file    Emotionally abused: Not on file    Physically abused: Not on file    Forced sexual activity: Not on file  Other Topics Concern  . Not on file  Social History Narrative  . Not on file    Family History  Problem Relation Age of Onset  . Heart disease Mother   . Diabetes Mother   . Hypertension Mother   . Heart attack Mother   . Diabetes Other   . Kidney disease Other   . Cardiomyopathy Unknown   . Diabetes Daughter   . Diabetes Son   . Hypertension Son    The patient does not have a history of early familial atrial fibrillation or other arrhythmias.  ROS- All systems are reviewed and negative except as per the HPI above.  Physical Exam: Vitals:   10/13/17 1212  BP: 140/62  Pulse: 67  SpO2: 98%  Weight: 172 lb (78 kg)  Height: _0  (1.626 m)    GEN- The patient is elderly appearing, alert and oriented x 3 today, in a wheelchair, poor historian Head- normocephalic, atraumatic Eyes-  Sclera clear, conjunctiva pink Ears- hearing intact Oropharynx- clear Neck- supple  Lungs- Clear to ausculation bilaterally, normal work of breathing Heart- regualr  GI- soft, NT, ND, + BS Extremities- no clubbing, cyanosis, or edema MS- no significant deformity or atrophy Skin- no rash or lesion Psych- euthymic mood, full affect Neuro- strength and sensation are intact  Wt Readings from Last 3 Encounters:  10/13/17 172 lb (78 kg)  09/01/17 162 lb 9.6 oz (73.8 kg)  08/24/17 165 lb (74.8 kg)    EKG today demonstrates NSR at 67 bpm No recent echo  Echo-Study Conclusions  -  Left ventricle: The cavity size was  mildly dilated. Systolic   function was moderately to severely reduced. The estimated   ejection fraction was in the range of 30% to 35%. Diffuse   hypokinesis. Doppler parameters are consistent with a reversible   restrictive pattern, indicative of decreased left ventricular   diastolic compliance and/or increased left atrial pressure (grade   3 diastolic dysfunction). - Aortic valve: Trileaflet; mildly thickened, mildly calcified   leaflets. There was trivial regurgitation. - Aorta: The aorta was mildly calcified. - Mitral valve: Severely calcified annulus. - Left atrium: The atrium was mildly dilated. Volume/bsa, S: 42.4   ml/m^2. - Right ventricle: The cavity size was mildly dilated. Wall   thickness was normal. - Tricuspid valve: There was trivial regurgitation. - Pulmonary arteries: Systolic pressure was mildly to moderately   increased. PA peak pressure: 47 mm Hg (S).  Epic records are reviewed at length today  Assessment and Plan:  1. Newly diagnosed atrial fibrillation The patient has asymptomatic newly diagnosed atrial fibrillation.  CHADS2VASC is 7 but she is high risk for anticoagulation with ESRD and gait instability    30 day monitor results are not in to discuss with pt  2. HTN Stable No change required today  3. ESRD On HD   IF significant afib burden on 30 day monitor will bring pt back to office, otherwise f/u with her usual physicians  Butch Penny C. Sylas Twombly, [facility] [address] [place], [place] [address] [phone]

## 2017-11-30 ENCOUNTER — Non-Acute Institutional Stay: Payer: Medicare Other | Admitting: Internal Medicine

## 2017-11-30 DIAGNOSIS — R531 Weakness: Secondary | ICD-10-CM

## 2017-11-30 DIAGNOSIS — Z515 Encounter for palliative care: Secondary | ICD-10-CM

## 2017-11-30 NOTE — Progress Notes (Signed)
PALLIATIVE CARE CONSULT VISIT   PATIENT NAME: Stefanie [name] DOB: June 21, 1945 MRN: [id number]  PRIMARY CARE PROVIDER:   Raelyn Number, MD  REFERRING PROVIDER:     Lesia Hausen, PA   Raelyn Number, MD [address] [place], [place] [address]  RESPONSIBLE PARTYPhebe Colla Mack(daughter) [phone]   RECOMMENDATIONS and PLAN:  1.  Weakness, generalized  R53.1:  Chronic and at baseline per staff.  Continues hemodialysis 3x/wk and tolerating same well. Participates in activities.  Continue to monitor.  2. Palliative care encounter Z51.5 :  DNR/DNI.  Maintain current living arrangement and quality of life. Palliative care will continue to follow.   I spent 15 minutes providing this consultation,  from 1:00pm to 1:15pm at [facility].   More than 50% of the time in this consultation was spent coordinating communication with patient and facility staff.   HISTORY OF PRESENT ILLNESS:  Follow-up with  Stefanie [name].  She remains in a generalized state of weakness and assists with her ADLs.  She continues hemodialysis treatments on T,TH,SAT without reports of ill effects.  She is mobile via wheelchair.  CODE STATUS: DNR  PPS: 40%  Mobile via [place] ELIGIBILITY/DIAGNOSIS: TBD  PAST MEDICAL HISTORY:  Past Medical History:  Diagnosis Date  . Anemia   . CAD (coronary artery disease)   . CHF (congestive heart failure) ([place])   . Constipation   . CVA (cerebral vascular accident) ([place]) [place]   left sided weakness  . Diabetes mellitus   . End stage renal disease ([place])    initiated HD 2006  . Hx of cardiovascular stress test    a. MV 03/2010:  no ischemia, EF 55%  . Hx of echocardiogram    a. Echo 03/2010: EF 55-60%, Gr 1 diast dysfn, trivial MS, mild to mod LAE, PASP 34, ;  b. Echo 11/13: mild LVH, EF 60%, Gr 1 diast dysfn, Ao sclerosis, no AS, MAC, slight MS, mean 3 mmHg, PASP 34  . Hyperlipidemia   . Hypertension   . Protein malnutrition ([place])   . Renal  insufficiency   . Retinal detachment, bilateral   . Secondary hyperparathyroidism ([place])     SOCIAL HX:  Social History   Tobacco Use  . Smoking status: Former Smoker    Last attempt to quit: 03/17/1983    Years since quitting: 34.3  . Smokeless tobacco: Never Used  Substance Use Topics  . Alcohol use: No    ALLERGIES: No Known Allergies   PERTINENT MEDICATIONS:  Outpatient Encounter Medications as of 07/26/2017  Medication Sig  . acetaminophen (TYLENOL) 325 MG tablet Take 650 mg by mouth 2 (two) times daily.  . calcium acetate (PHOSLO) 667 MG capsule Take 667-2,001 mg by mouth See admin instructions. Take 3 capsules three times a day with meals and take 1 capsule twice daily with snacks  . diclofenac sodium (VOLTAREN) 1 % GEL Apply 2 g topically See admin instructions. Apply to both knees daily as needed for pain and to right hand 2-3 times a day as needed for pain (BRAND NAME VOLTAREN)  . glipiZIDE (GLUCOTROL) 10 MG tablet Take 10 mg by mouth daily before breakfast.  . latanoprost (XALATAN) 0.005 % ophthalmic solution Place 1 drop into the left eye at bedtime.  . lidocaine-prilocaine (EMLA) cream Apply 1 application topically as needed (topical anesthesia for hemodialysis if Gebauers and Lidocaine injection are ineffective.). (Patient taking differently: Apply 1 application topically 3 (three) times a  week. )  . lisinopril (PRINIVIL,ZESTRIL) 30 MG tablet Take 30 mg by mouth every morning.   . loratadine (CLARITIN) 10 MG tablet Take 10 mg by mouth daily.  Marland Kitchen oxyCODONE (OXY IR/ROXICODONE) 5 MG immediate release tablet Take 2.5 mg by mouth 2 (two) times daily.   . polyethylene glycol (MIRALAX) packet Use 17 gms three times daily until bowels move, with maximum of 3 consecutive days (Patient taking differently: Take 17 g by mouth See admin instructions. On Tuesday, Thursday and Saturday as needed)  . pravastatin (PRAVACHOL) 20 MG tablet Take 20 mg by mouth at bedtime.  Marland Kitchen rOPINIRole (REQUIP)  0.5 MG tablet Take 0.5 mg by mouth at bedtime.  . sennosides-docusate sodium (SENOKOT-S) 8.6-50 MG tablet Take 2 tablets by mouth daily as needed for constipation.   . sevelamer carbonate (RENVELA) 800 MG tablet Take 2,400-4,800 mg by mouth See admin instructions. Take 4800 mg three times a day with meals and 2400 mg twice daily with snacks  . sorbitol 70 % solution Take 30 mLs by mouth daily as needed (for constipation).   No facility-administered encounter medications on file as of 07/26/2017.     PHYSICAL EXAM:   General: Well nourished elderly female sitting in wheelchair and in NAD Cardiovascular: regular rate and rhythm Pulmonary: Unlabored respirations Extremities: Trace generalized edema of BLE Skin: Hyperpigmentation distal BLE.  Exposed skin is intact Neurological: A & O x3.  Rt. Hemiparesis Psych:  Jpyous mood.  Calm and cooperative.  Gonzella Lex, NP-C

## 2018-06-29 ENCOUNTER — Other Ambulatory Visit: Payer: Self-pay

## 2018-06-29 ENCOUNTER — Non-Acute Institutional Stay: Payer: Medicare Other | Admitting: Internal Medicine

## 2018-06-29 DIAGNOSIS — Z515 Encounter for palliative care: Secondary | ICD-10-CM

## 2018-07-01 NOTE — Progress Notes (Signed)
[facility] CARE CONSULT VISIT   PATIENT NAME: Stefanie [name] DOB: 02-01-1946 MRN: [id number]  PRIMARY CARE PROVIDER:   Raelyn Number, MD  REFERRING PROVIDER:     Lesia Hausen, PA    RESPONSIBLE PARTY:  Self and  Faleisa Mack(daughter) [phone]   RECOMMENDATIONS and PLAN:  Palliative Care Encounter Z51.5  1.  Advance care planning:  Code status remains as DNR/DNI. She plans on continuing hemodialysis as long as possible. She will remain as a resident of  ALF.  Palliative care will continue to follow.  2.  Weakness, generalized:  Chronic and at baseline.  Related to ESRD, CHF and hemodialysis. Participates in activities as tolerated.  Continue to monitor and assist with ADLs..  Due to the COVID-19 crisis, this visit was performed via telehealth  I spent 15 minutes providing this consultation,  from 1000 to 1015 at [facility].   More than 50% of the time in this consultation was spent coordinating communication with patient and facility staff.   HISTORY OF PRESENT ILLNESS:  Follow-up with  Stefanie [name].  She remains in a baseline generalized state of weakness and assists with her own ADLs.  She continues hemodialysis treatments on T,TH,SAT without reports of ill effects.  She is mobile via wheelchair.  She has a good appetite and her weight is stable.  CODE STATUS: DANR/DNI  PPS: 40%  Mobile via [place] ELIGIBILITY/DIAGNOSIS: TBD  PAST MEDICAL HISTORY:  Past Medical History:  Diagnosis Date  . Anemia   . CAD (coronary artery disease)   . CHF (congestive heart failure) ([place])   . Constipation   . CVA (cerebral vascular accident) ([place]) [place]   left sided weakness  . Diabetes mellitus   . End stage renal disease ([place])    initiated HD 2006  . Hx of cardiovascular stress test    a. MV 03/2010:  no ischemia, EF 55%  . Hx of echocardiogram    a. Echo 03/2010: EF 55-60%, Gr 1 diast dysfn, trivial MS, mild to mod LAE, PASP 34, ;  b. Echo  11/13: mild LVH, EF 60%, Gr 1 diast dysfn, Ao sclerosis, no AS, MAC, slight MS, mean 3 mmHg, PASP 34  . Hyperlipidemia   . Hypertension   . Protein malnutrition ([place])   . Renal insufficiency   . Retinal detachment, bilateral   . Secondary hyperparathyroidism ([place])      PHYSICAL EXAM:   General: Well nourished elderly female sitting in wheelchair and in NAD Cardiovascular: unable to assess Pulmonary: no increased respiratory effort Skin: Exposed skin is intact Neurological: A & O x3.  Rt. Hemiparesis Psych:  Appropriate mood.  Calm and cooperative.  Gonzella Lex, NP-C

## 2018-08-10 ENCOUNTER — Encounter: Payer: Medicare Other | Admitting: Internal Medicine

## 2018-08-11 ENCOUNTER — Other Ambulatory Visit: Payer: Self-pay

## 2018-10-13 ENCOUNTER — Non-Acute Institutional Stay: Payer: Medicare Other | Admitting: Internal Medicine

## 2018-10-13 DIAGNOSIS — Z515 Encounter for palliative care: Secondary | ICD-10-CM

## 2018-10-14 ENCOUNTER — Other Ambulatory Visit: Payer: Self-pay

## 2018-10-24 NOTE — Progress Notes (Signed)
[facility] CARE CONSULT VISIT   PATIENT NAME: Stefanie [name] DOB: 04-Aug-1945 MRN: [id number]  PRIMARY CARE PROVIDER:   Lesia Hausen, PA  REFERRING PROVIDER:   Lesia Hausen, PA    RESPONSIBLE PARTY:  Self and  Faleisa Mack(daughter) [phone]   RECOMMENDATIONS and PLAN:  Palliative Care Encounter Z51.5  1.  Advance care planning:  Code status remains as DNR/DNI.  Continue hemodialysis as long as it is well tolerated. She will remain as a resident of  ALF.  Palliative care will continue to follow.  2.  Weakness, generalized:  Chronic and at baseline.  Related to ESRD, CHF and hemodialysis. Continue to monitor and assist with ADLs..  Due to the COVID-19 crisis, this visit was performed via telehealth  I spent 20 minutes providing this consultation,  from 1530 to 1550 at [facility].   More than 50% of the time in this consultation was spent coordinating communication with patient and facility staff.   HISTORY OF PRESENT ILLNESS:  Follow-up with  Stefanie S Pacha.  She remains in a baseline generalized state of weakness and assists with her own ADLs.  She continues hemodialysis treatments on T,TH,SAT without reports of ill effects.    She has a good appetite and her weight is stable.  CODE STATUS: DANR/DNI  PPS: 40%  Mobile via [place] ELIGIBILITY/DIAGNOSIS: TBD  PAST MEDICAL HISTORY:  Past Medical History:  Diagnosis Date  . Anemia   . CAD (coronary artery disease)   . CHF (congestive heart failure) ([place])   . Constipation   . CVA (cerebral vascular accident) ([place]) [place]   left sided weakness  . Diabetes mellitus   . End stage renal disease ([place])    initiated HD 2006  . Hx of cardiovascular stress test    a. MV 03/2010:  no ischemia, EF 55%  . Hx of echocardiogram    a. Echo 03/2010: EF 55-60%, Gr 1 diast dysfn, trivial MS, mild to mod LAE, PASP 34, ;  b. Echo 11/13: mild LVH, EF 60%, Gr 1 diast dysfn, Ao sclerosis, no AS, MAC, slight MS,  mean 3 mmHg, PASP 34  . Hyperlipidemia   . Hypertension   . Protein malnutrition ([place])   . Renal insufficiency   . Retinal detachment, bilateral   . Secondary hyperparathyroidism ([place])      PHYSICAL EXAM:   General: Well nourished elderly female sitting in wheelchair and in NAD Cardiovascular: RRR Pulmonary: no increased respiratory effort Skin: Exposed skin is intact Neurological: A & O x3.  Rt. Hemiparesis Psych:  Appropriate and pleasant mood.    Gonzella Lex, NP-C

## 2018-10-25 IMAGING — CR DG CHEST 2V
2 series · 2 of 2 positions shown · non-contrast
Comparison: 08/01/2017.

CLINICAL DATA: Shortness of breath and weakness.

EXAM:
CHEST - 2 VIEW

[chest lat]
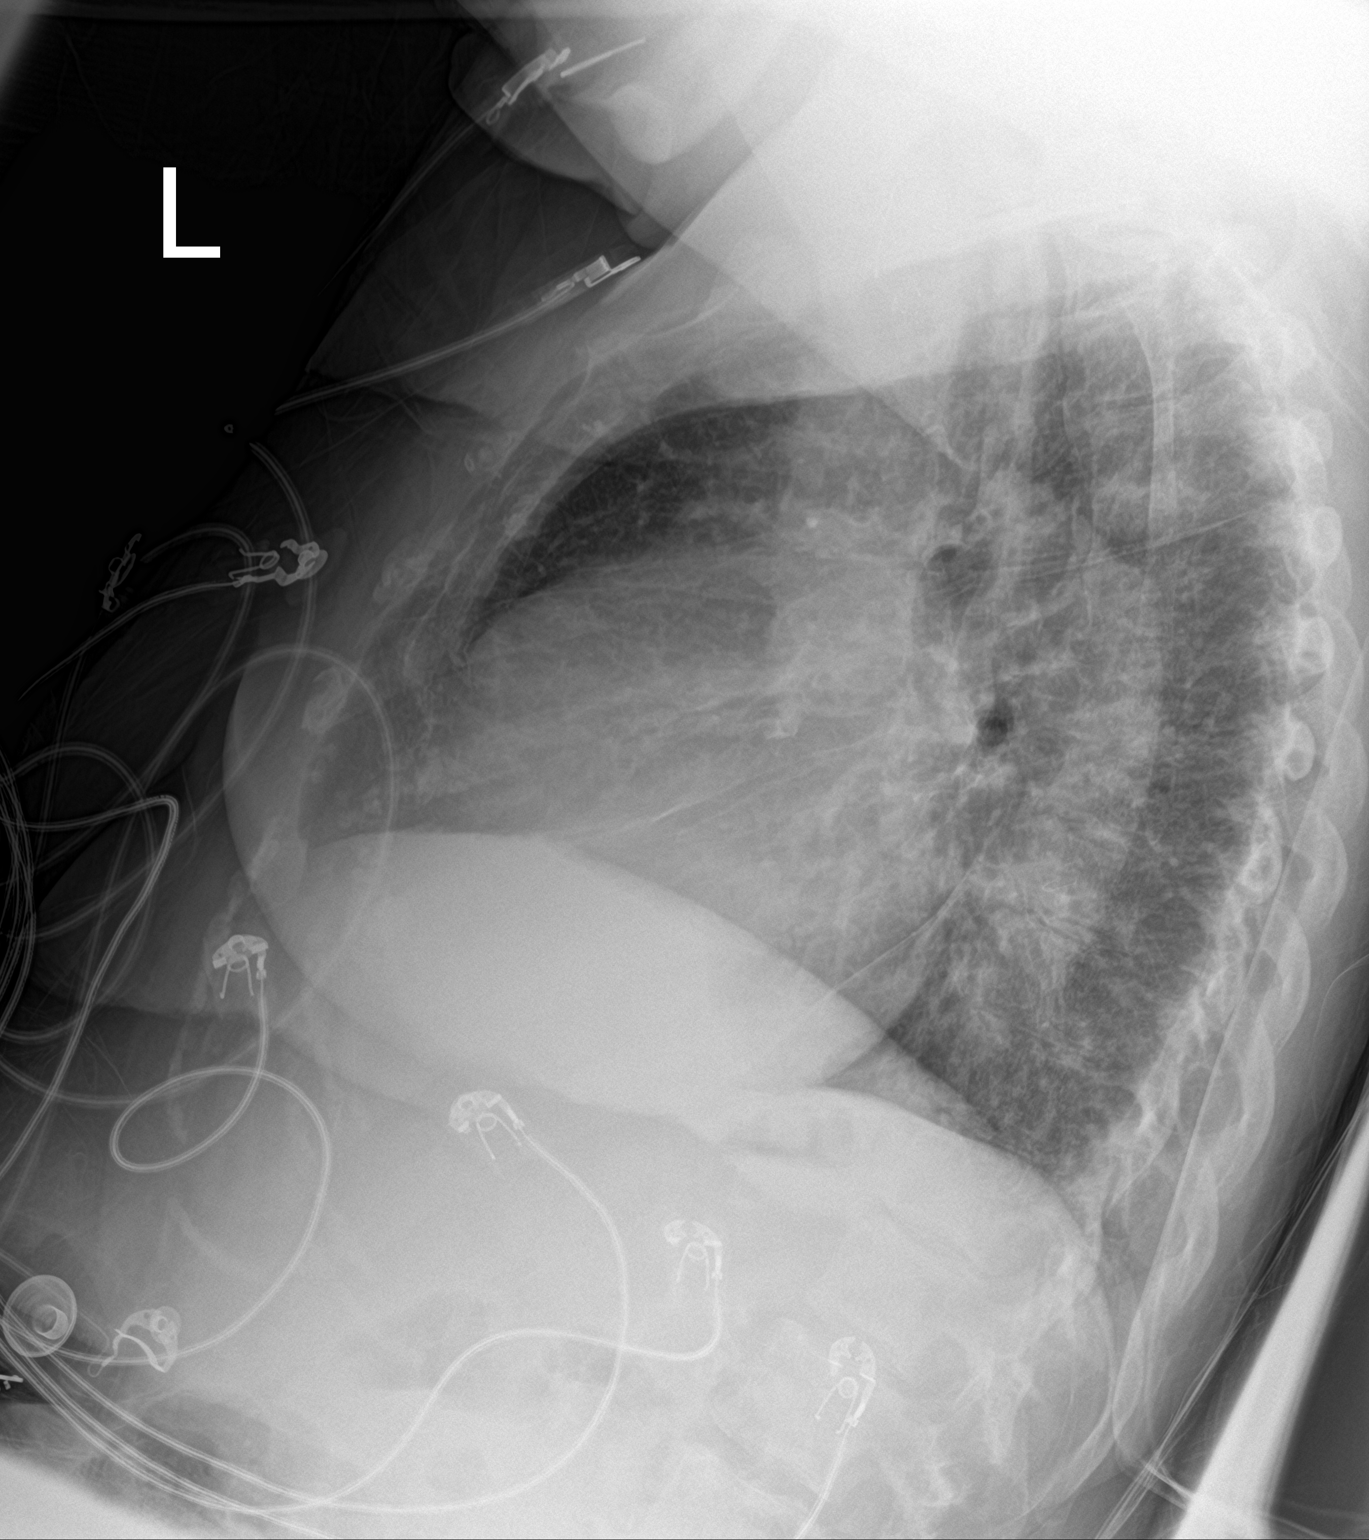

[chest ap]
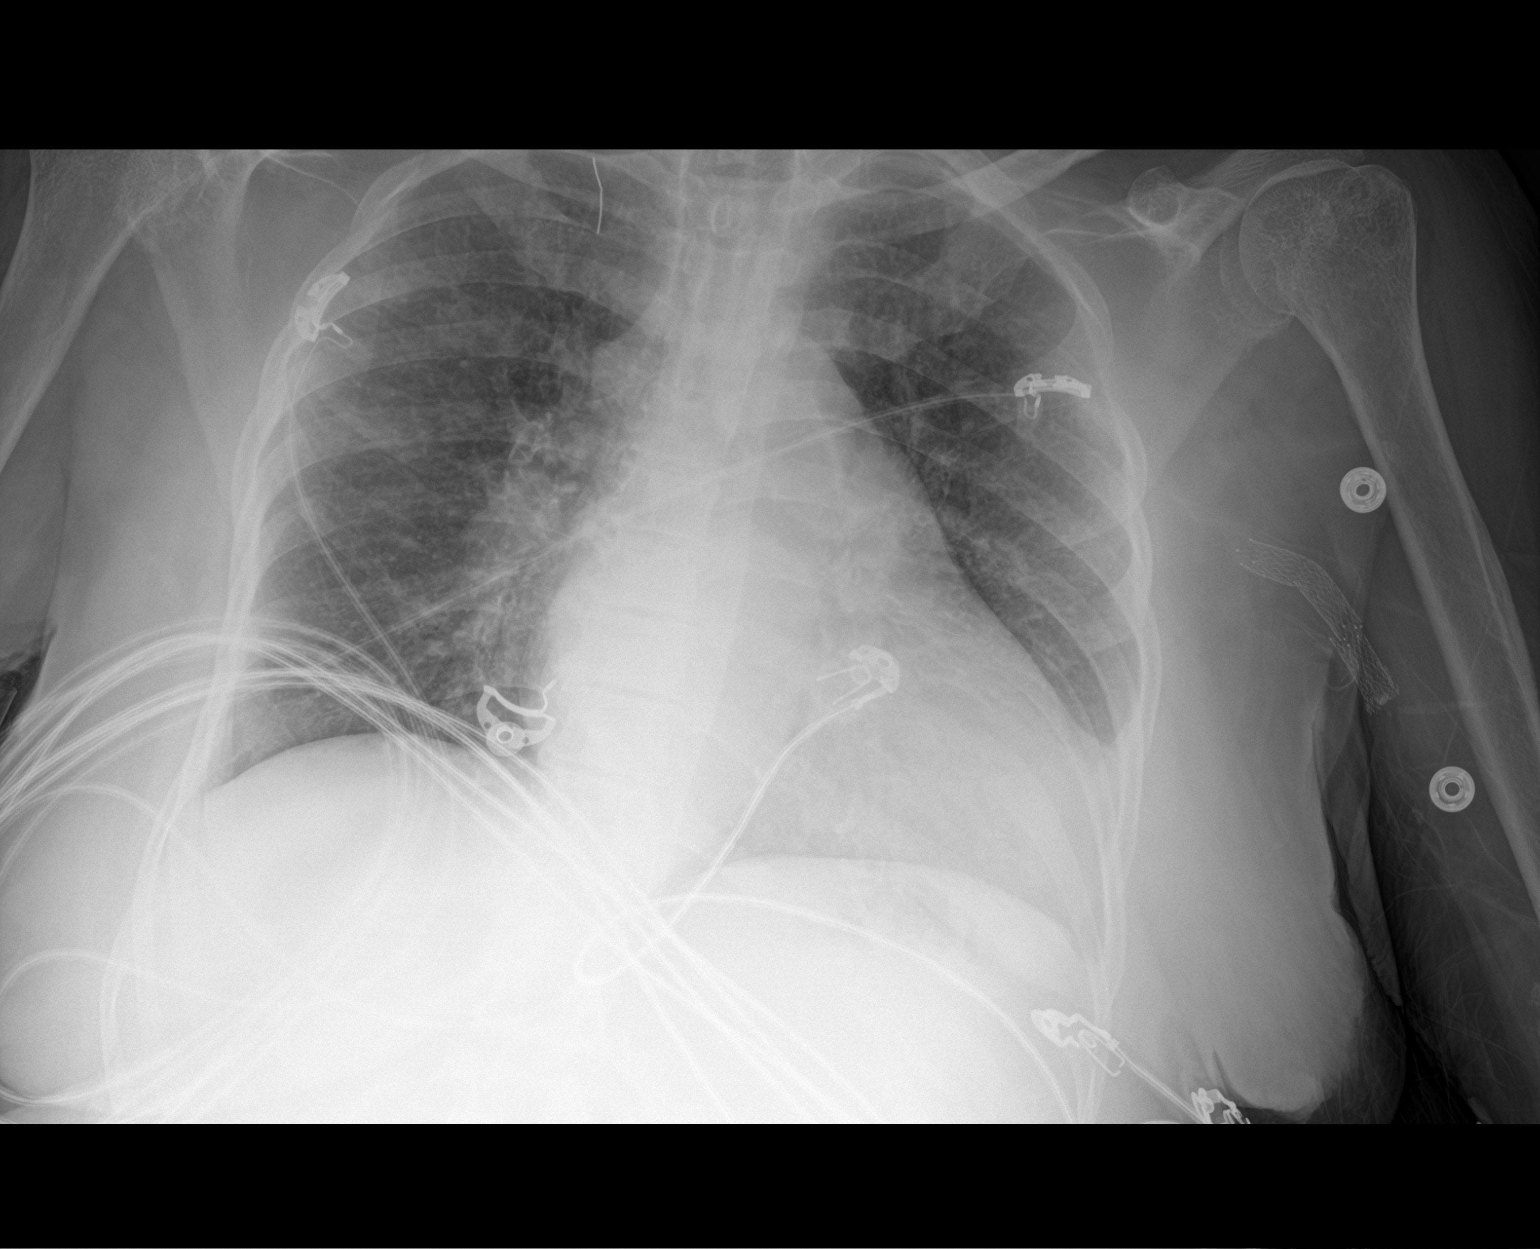

[2 of 2 positions shown; findings below may reference images not displayed]

FINDINGS: Linear metallic foreign body projecting over the medial right lung
apex is again noted and appears unchanged from the previous exam.
Stable cardiac enlargement. Pulmonary vascular congestion is
identified which appears unchanged from prior study. No superimposed
airspace consolidation. The visualized skeletal structures are
unremarkable.
IMPRESSION: 1. Stable cardiac enlargement and pulmonary vascular congestion.

## 2018-12-20 ENCOUNTER — Emergency Department (HOSPITAL_COMMUNITY): Payer: Medicare Other

## 2018-12-20 ENCOUNTER — Emergency Department (HOSPITAL_COMMUNITY)
Admission: EM | Admit: 2018-12-20 | Discharge: 2018-12-20 | Disposition: A | Discharge status: Home / Self Care | Payer: Medicare Other | Attending: Emergency Medicine | Admitting: Emergency Medicine

## 2018-12-20 ENCOUNTER — Other Ambulatory Visit: Payer: Self-pay

## 2018-12-20 ENCOUNTER — Encounter (HOSPITAL_COMMUNITY): Payer: Self-pay | Admitting: Emergency Medicine

## 2018-12-20 DIAGNOSIS — Z87891 Personal history of nicotine dependence: Secondary | ICD-10-CM | POA: Insufficient documentation

## 2018-12-20 DIAGNOSIS — E11649 Type 2 diabetes mellitus with hypoglycemia without coma: Secondary | ICD-10-CM | POA: Diagnosis not present

## 2018-12-20 DIAGNOSIS — Z86718 Personal history of other venous thrombosis and embolism: Secondary | ICD-10-CM | POA: Diagnosis not present

## 2018-12-20 DIAGNOSIS — J029 Acute pharyngitis, unspecified: Secondary | ICD-10-CM | POA: Diagnosis not present

## 2018-12-20 DIAGNOSIS — Z7984 Long term (current) use of oral hypoglycemic drugs: Secondary | ICD-10-CM | POA: Diagnosis not present

## 2018-12-20 DIAGNOSIS — I132 Hypertensive heart and chronic kidney disease with heart failure and with stage 5 chronic kidney disease, or end stage renal disease: Secondary | ICD-10-CM | POA: Insufficient documentation

## 2018-12-20 DIAGNOSIS — I5042 Chronic combined systolic (congestive) and diastolic (congestive) heart failure: Secondary | ICD-10-CM | POA: Insufficient documentation

## 2018-12-20 DIAGNOSIS — E162 Hypoglycemia, unspecified: Secondary | ICD-10-CM

## 2018-12-20 DIAGNOSIS — I2510 Atherosclerotic heart disease of native coronary artery without angina pectoris: Secondary | ICD-10-CM | POA: Insufficient documentation

## 2018-12-20 DIAGNOSIS — U071 COVID-19: Secondary | ICD-10-CM | POA: Insufficient documentation

## 2018-12-20 DIAGNOSIS — Z992 Dependence on renal dialysis: Secondary | ICD-10-CM | POA: Diagnosis not present

## 2018-12-20 DIAGNOSIS — N186 End stage renal disease: Secondary | ICD-10-CM | POA: Diagnosis not present

## 2018-12-20 DIAGNOSIS — R05 Cough: Secondary | ICD-10-CM | POA: Diagnosis present

## 2018-12-20 DIAGNOSIS — Z79899 Other long term (current) drug therapy: Secondary | ICD-10-CM | POA: Insufficient documentation

## 2018-12-20 DIAGNOSIS — J069 Acute upper respiratory infection, unspecified: Secondary | ICD-10-CM

## 2018-12-20 LAB — CBC
HCT: 39.9 % (ref 36.0–46.0)
Hemoglobin: 12.3 g/dL (ref 12.0–15.0)
MCH: 31.5 pg (ref 26.0–34.0)
MCHC: 30.8 g/dL (ref 30.0–36.0)
MCV: 102 fL — ABNORMAL HIGH (ref 80.0–100.0)
Platelets: DECREASED 10*3/uL (ref 150–400)
RBC: 3.91 MIL/uL (ref 3.87–5.11)
RDW: 15 % (ref 11.5–15.5)
WBC: 10.7 10*3/uL — ABNORMAL HIGH (ref 4.0–10.5)
nRBC: 0 % (ref 0.0–0.2)

## 2018-12-20 LAB — BASIC METABOLIC PANEL
Anion gap: 21 — ABNORMAL HIGH (ref 5–15)
BUN: 44 mg/dL — ABNORMAL HIGH (ref 8–23)
CO2: 31 mmol/L (ref 22–32)
Calcium: 8.4 mg/dL — ABNORMAL LOW (ref 8.9–10.3)
Chloride: 88 mmol/L — ABNORMAL LOW (ref 98–111)
Creatinine, Ser: 10.45 mg/dL — ABNORMAL HIGH (ref 0.44–1.00)
GFR calc Af Amer: 4 mL/min — ABNORMAL LOW (ref >60)
GFR calc non Af Amer: 3 mL/min — ABNORMAL LOW (ref >60)
Glucose, Bld: 53 mg/dL — ABNORMAL LOW (ref 70–99)
Potassium: 4.1 mmol/L (ref 3.5–5.1)
Sodium: 140 mmol/L (ref 135–145)

## 2018-12-20 LAB — CBG MONITORING, ED
Glucose-Capillary: 35 mg/dL — CL (ref 70–99)
Glucose-Capillary: 75 mg/dL (ref 70–99)
Glucose-Capillary: 97 mg/dL (ref 70–99)

## 2018-12-20 MED ORDER — SODIUM CHLORIDE 0.9% FLUSH: 3.0000 mL | INTRAVENOUS | Status: DC | PRN

## 2018-12-20 MED ORDER — SODIUM CHLORIDE 0.9 % IV SOLN: 250.0000 mL | INTRAVENOUS | Status: DC | PRN

## 2018-12-20 MED ORDER — ALBUTEROL SULFATE HFA 108 (90 BASE) MCG/ACT IN AERS
2.0000 | INHALATION_SPRAY | Freq: Once | RESPIRATORY_TRACT | Status: AC
  Administered 2018-12-20: 2 via RESPIRATORY_TRACT
  Filled 2018-12-20: qty 6.7

## 2018-12-20 NOTE — Discharge Instructions (Addendum)
Your glucose today was low.  Do not take your glipizide the rest of the day.  Be sure to check your glucose frequently and eat well-balanced meals.  For your cough, you are being tested for the novel coronavirus.  Please self quarantine until this results  Your dialysis is rescheduled for tomorrow at 7 am. Be there at 6:40 AM

## 2018-12-20 NOTE — ED Notes (Signed)
Patient verbalizes understanding of discharge instructions. Opportunity for questioning and answers were provided. Armband removed by staff, pt discharged from ED with [facility] staff for transport back to nursing home.

## 2018-12-20 NOTE — ED Notes (Signed)
Report given to [facility] for transport back to [facility]

## 2018-12-20 NOTE — ED Notes (Signed)
Pt CBG was 97, notified Maggie(RN)

## 2018-12-20 NOTE — ED Notes (Signed)
Discharge instructions discussed with Pt. Pt verbalized understanding. Pt stable and leaving via [facility], report called to facility by [facility].

## 2018-12-20 NOTE — ED Triage Notes (Signed)
Pt arrives via EMS from [facility] where staff reports pt needs to be checked out. Pt only complaining of being cold and a cough since Saturday. Last HD treatment Saturday. CBG for EMS 70. Pt has not eaten today.

## 2018-12-20 NOTE — ED Provider Notes (Signed)
[facility] EMERGENCY DEPARTMENT Provider Note   CSN: [id number] Arrival date & time:        History   Chief Complaint Chief Complaint  Patient presents with  . Cough    HPI Stefanie [name] is a 73 y.o. female.     HPI  73 year old female with cough and feeling cold.  Started about 3 days ago.  1 week ago she received a flu shot.  She denies any fever.  The cough is occasionally productive.  She denies any vomiting, chest pain, shortness of breath or leg swelling.  Last had dialysis on Saturday, next due today but missed it because she was sent here by her nursing facility.  They also apparently checked her glucose and it was 40 but did not give her any medicines.  She has not eaten today.  Past Medical History:  Diagnosis Date  . Anemia   . CAD (coronary artery disease)   . CHF (congestive heart failure) ([place])   . Constipation   . CVA (cerebral vascular accident) ([place]) [place]   left sided weakness  . Diabetes mellitus   . End stage renal disease ([place])    initiated HD 2006  . Hx of cardiovascular stress test    a. MV 03/2010:  no ischemia, EF 55%  . Hx of echocardiogram    a. Echo 03/2010: EF 55-60%, Gr 1 diast dysfn, trivial MS, mild to mod LAE, PASP 34, ;  b. Echo 11/13: mild LVH, EF 60%, Gr 1 diast dysfn, Ao sclerosis, no AS, MAC, slight MS, mean 3 mmHg, PASP 34  . Hyperlipidemia   . Hypertension   . Protein malnutrition ([place])   . Renal insufficiency   . Retinal detachment, bilateral   . Secondary hyperparathyroidism [facility])     Patient Active Problem List   Diagnosis Date Noted  . Nausea & vomiting 07/30/2017  . Acute blood loss anemia 02/09/2016  . Hypotension due to blood loss 02/09/2016  . Chronic combined systolic and diastolic CHF (congestive heart failure) ([place]) 02/09/2016  . Hemorrhage 02/08/2016  . Anemia 02/08/2016  . Left leg pain 04/10/2015  . Bacteremia 04/10/2015  . Lactic acidosis   . Cellulitis 04/09/2015  . History of  CVA with residual deficit 02/11/2015  . Weakness generalized 02/08/2015  . DVT (deep venous thrombosis) ([place])   . CHF (congestive heart failure) ([place])   . Chronic diastolic congestive heart failure ([place])   . ESRD (end stage renal disease) ([place])   . Congestive dilated cardiomyopathy ([place])   . Left leg DVT ([place]) 02/01/2015  . Leukocytosis 02/01/2015  . Elevated troponin 02/01/2015  . SOB (shortness of breath) 02/01/2015  . Type 2 diabetes, controlled, with neuropathy ([place]) 12/20/2014  . Pain due to onychomycosis of toenail 12/20/2014  . 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 Mechanical complication of other vascular device, implant, and graft 05/19/2013  . PAD (peripheral artery disease) ([place]) 02/24/2012  . Murmur 12/24/2011  . Bradycardia-intermittent sinus 07/10/2010  . DYSPNEA ON EXERTION 02/25/2010  . CHEST PAIN, EXERTIONAL 02/25/2010  . DM 02/24/2010  . PROTEIN MALNUTRITION 02/24/2010  . HLD (hyperlipidemia) 02/24/2010  . ANEMIA 02/24/2010  . Essential hypertension 02/24/2010  . Cerebral artery occlusion with cerebral infarction ([place]) 02/24/2010  . ESRD on dialysis ([place]) 02/24/2010  . 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 ARTERIOVENOUS GRAFT PLACEMENT     BUE numerous times  . ARTERIOVENOUS GRAFT PLACEMENT  11/13/10   Lt femoral - Dr. Kellie Simmering  . BIOPSY  08/03/2017   Procedure: BIOPSY;  Surgeon: Clarene Essex, MD;  Location: [facility];  Service: Endoscopy;;  . BRONCHIAL BRUSHINGS  08/03/2017   Procedure: Esophageal  BRUSHINGS;  Surgeon: Clarene Essex, MD;  Location: [facility];  Service: Endoscopy;;  esophageal brushing  . CARDIAC CATHETERIZATION N/A 02/05/2015   Procedure: Left Heart Cath and Coronary Angiography;  Surgeon: Jettie Booze, MD;  Location: [facility] CV LAB;  Service: Cardiovascular;  Laterality: N/A;  . CATARACT EXTRACTION    . DG AV DIALYSIS GRAFT DECLOT OR  06/06/11, 08/26/11    performed in IR  . ESOPHAGOGASTRODUODENOSCOPY N/A 08/03/2017   Procedure: ESOPHAGOGASTRODUODENOSCOPY (EGD);  Surgeon: Clarene Essex, MD;  Location: [facility];  Service: Endoscopy;  Laterality: N/A;  . PERIPHERAL VASCULAR CATHETERIZATION N/A 02/06/2015   Procedure: A/V Shuntogram;  Surgeon: Elam Dutch, MD;  Location: [facility] CV LAB;  Service: Cardiovascular;  Laterality: N/A;  . PERIPHERAL VASCULAR CATHETERIZATION Left 02/26/2016   Procedure: A/V Shuntogram;  Surgeon: Waynetta Sandy, MD;  Location: [facility] CV LAB;  Service: Cardiovascular;  Laterality: Left;  . PERIPHERAL VASCULAR CATHETERIZATION Left 02/26/2016   Procedure: Peripheral Vascular Balloon Angioplasty;  Surgeon: Waynetta Sandy, MD;  Location: [facility] CV LAB;  Service: Cardiovascular;  Laterality: Left;  Lt thigh avf  . SHUNTOGRAM N/A 05/29/2013   Procedure: Earney Mallet;  Surgeon: Conrad Marysville, MD;  Location: [facility] CATH LAB;  Service: Cardiovascular;  Laterality: N/A;  . SHUNTOGRAM Left 03/01/2014   Procedure: Earney Mallet;  Surgeon: Conrad Clearmont, MD;  Location[facility] CATH LAB;  Service: Cardiovascular;  Laterality: Left;  . TUBAL LIGATION       OB History   No obstetric history on file.      Home Medications    Prior to Admission medications   Medication Sig Start Date End Date Taking? Authorizing Provider  acetaminophen (TYLENOL) 325 MG tablet Take 650 mg by mouth 3 (three) times daily.     [provider]  b complex-vitamin c-folic acid (NEPHRO-VITE) 0.8 MG TABS tablet Take 1 tablet by mouth See admin instructions. Every Monday, Wednesday and Friday    [provider]  calcium acetate (PHOSLO) 667 MG capsule Take 667-2,001 mg by mouth See admin instructions. Take 3 capsules three times a day with meals and take 1 capsule twice daily with snacks    [provider]  cinacalcet (SENSIPAR) 30 MG tablet Take 1 tablet (30 mg total) by mouth Every Tuesday,Thursday,and Saturday  with dialysis. 08/05/17   Hosie Poisson, MD  Darbepoetin Alfa (ARANESP) 100 MCG/0.5ML SOSY injection Inject 0.5 mLs (100 mcg total) into the vein every Thursday with hemodialysis. 08/05/17   Hosie Poisson, MD  diclofenac sodium (VOLTAREN) 1 % GEL Apply 2 g topically See admin instructions. Apply to both knees daily as needed for pain and to right hand 2-3 times a day as needed for pain (BRAND NAME VOLTAREN)    [provider]  glipiZIDE (GLUCOTROL) 10 MG tablet Take 10 mg by mouth 2 (two) times daily before a meal.     [provider]  latanoprost (XALATAN) 0.005 % ophthalmic solution Place 1 drop into both eyes at bedtime.     [provider]  LEVEMIR FLEXTOUCH 100 UNIT/ML Pen 10 Units at bedtime. 09/15/17   [provider]  lidocaine-prilocaine (EMLA) cream Apply 1 application topically as needed (topical anesthesia for hemodialysis if Gebauers and Lidocaine injection are ineffective.). 02/25/15  Love, Pamela S, PA-C  loratadine (CLARITIN) 10 MG tablet Take 10 mg by mouth daily.    [provider]  Melatonin 5 MG TABS Take 5 mg by mouth at bedtime.    [provider]  neomycin-bacitracin-polymyxin (NEOSPORIN) ointment Apply 1 application topically 2 (two) times daily as needed for wound care.     [provider]  pantoprazole (PROTONIX) 40 MG tablet Take 1 tablet (40 mg total) by mouth 2 (two) times daily. 08/05/17   Hosie Poisson, MD  polyethylene glycol [facility]) packet Use 17 gms three times daily until bowels move, with maximum of 3 consecutive days Patient taking differently: Take 17 g by mouth 3 (three) times daily as needed for mild constipation, moderate constipation or severe constipation.  09/02/15   Charlann Lange, PA-C  pravastatin (PRAVACHOL) 20 MG tablet Take 20 mg by mouth at bedtime.    [provider]  rOPINIRole (REQUIP) 1 MG tablet Take 1 mg by mouth at bedtime.     [provider]  sennosides-docusate  sodium (SENOKOT-S) 8.6-50 MG tablet Take 2 tablets by mouth daily.     [provider]  sorbitol 70 % solution Take 30 mLs by mouth daily as needed (for constipation).    [provider]  traZODone (DESYREL) 50 MG tablet Take 25 mg by mouth at bedtime as needed for sleep.  07/06/17   [provider]    Family History Family History  Problem Relation Age of Onset  . Heart disease Mother   . Diabetes Mother   . Hypertension Mother   . Heart attack Mother   . Diabetes Other   . Kidney disease Other   . Cardiomyopathy Other   . Diabetes Daughter   . Diabetes Son   . Hypertension Son     Social History Social History   Tobacco Use  . Smoking status: Former Smoker    Quit date: 03/17/1983    Years since quitting: 35.7  . Smokeless tobacco: Never Used  Substance Use Topics  . Alcohol use: No  . Drug use: No     Allergies   Patient has no known allergies.   Review of Systems Review of Systems  Constitutional: Negative for fever.  Respiratory: Positive for cough. Negative for shortness of breath.   Cardiovascular: Negative for chest pain.  Gastrointestinal: Negative for abdominal pain and vomiting.  All other systems reviewed and are negative.    Physical Exam Updated Vital Signs BP 133/71 (BP Location: Right Arm)   Pulse 76   Temp 97.9 F (36.6 C) (Oral)   Resp (!) 23   Ht _0  (1.626 m)   Wt 77.6 kg   SpO2 96%   BMI 29.35 kg/m   Physical Exam Vitals signs and nursing note reviewed.  Constitutional:      General: She is not in acute distress.    Appearance: She is well-developed. She is not ill-appearing or diaphoretic.  HENT:     Head: Normocephalic and atraumatic.     Right Ear: External ear normal.     Left Ear: External ear normal.     Nose: Nose normal.  Eyes:     General:        Right eye: No discharge.        Left eye: No discharge.  Cardiovascular:     Rate and Rhythm: Normal rate and regular rhythm.     Heart  sounds: Normal heart sounds.  Pulmonary:     Effort:  Pulmonary effort is normal.     Breath sounds: Normal breath sounds.     Comments: Perhaps some slight wheezes, but not consistent Abdominal:     Palpations: Abdomen is soft.     Tenderness: There is no abdominal tenderness.  Skin:    General: Skin is warm and dry.  Neurological:     Mental Status: She is alert.  Psychiatric:        Mood and Affect: Mood is not anxious.      ED Treatments / Results  Labs (all labs ordered are listed, but only abnormal results are displayed) Labs Reviewed  BASIC METABOLIC PANEL - Abnormal; Notable for the following components:      Result Value   Chloride 88 (*)    Glucose, Bld 53 (*)    BUN 44 (*)    Creatinine, Ser 10.45 (*)    Calcium 8.4 (*)    GFR calc non Af Amer 3 (*)    GFR calc Af Amer 4 (*)    Anion gap 21 (*)    All other components within normal limits  CBC - Abnormal; Notable for the following components:   WBC 10.7 (*)    MCV 102.0 (*)    All other components within normal limits  CBG MONITORING, ED - Abnormal; Notable for the following components:   Glucose-Capillary 35 (*)    All other components within normal limits  NOVEL CORONAVIRUS, NAA (HOSP ORDER, SEND-OUT TO REF LAB; TAT 18-24 HRS)  CBG MONITORING, ED  CBG MONITORING, ED  CBG MONITORING, ED    EKG EKG Interpretation  Date/Time:  Tuesday December 20 2018 08:44:19 EDT Ventricular Rate:  72 PR Interval:    QRS Duration: 130 QT Interval:  411 QTC Calculation: 450 R Axis:   -37 Text Interpretation:  Sinus rhythm Atrial premature complexes in couplets Left bundle branch block T wave changes similar distribution compared to 2019 Confirmed by Sherwood Gambler ([id number]) on 12/20/2018 8:47:18 AM   Radiology Dg Chest Port 1 View  Result Date: 12/20/2018 CLINICAL DATA:  Cough. EXAM: PORTABLE CHEST 1 VIEW COMPARISON:  Radiographs of August 24, 2017. FINDINGS: Stable cardiomegaly. No pneumothorax or pleural effusion is  noted. No acute pulmonary disease is noted. Bony thorax is unremarkable. Stable linear metallic density is seen over the right lung apex consistent with foreign body. IMPRESSION: No acute cardiopulmonary abnormality seen. Stable linear metallic density is seen over right lung apex consistent with foreign body. Electronically Signed   By: Marijo Conception M.D.   On: 12/20/2018 10:02    Procedures Procedures (including critical care time)  Medications Ordered in ED Medications  sodium chloride flush (NS) 0.9 % injection 3 mL (has no administration in time range)  0.9 %  sodium chloride infusion (has no administration in time range)  albuterol (VENTOLIN HFA) 108 (90 Base) MCG/ACT inhaler 2 puff (2 puffs Inhalation Given 12/20/18 0933)     Initial Impression / Assessment and Plan / ED Course  I have reviewed the triage vital signs and the nursing notes.  Pertinent labs & imaging results that were available during my care of the patient were reviewed by me and considered in my medical decision making (see chart for details).        Patient has a mild cough but no dyspnea or significant vital sign abnormality.  The patient overall appears well.  Labs are reassuring and near her baseline besides the hypoglycemia.  This was treated with p.o. fluids and food  and her glucose has come up appropriately.  She will need to not take her glipizide today.  Almost undoubtedly this is from not eating this morning.  Otherwise, she appears stable for outpatient management.  Will test her for the novel coronavirus though she appears quite well.  Stefanie S Lumley was evaluated in Emergency Department on 12/20/2018 for the symptoms described in the history of present illness. She was evaluated in the context of the global COVID-19 pandemic, which necessitated consideration that the patient might be at risk for infection with the SARS-CoV-2 virus that causes COVID-19. Institutional protocols and algorithms that pertain  to the evaluation of patients at risk for COVID-19 are in a state of rapid change based on information released by regulatory bodies including the CDC and federal and state organizations. These policies and algorithms were followed during the patient's care in the ED.   Final Clinical Impressions(s) / ED Diagnoses   Final diagnoses:  Hypoglycemia  Viral URI with cough    ED Discharge Orders    None       Sherwood Gambler, MD 12/20/18 1114

## 2018-12-20 NOTE — ED Notes (Signed)
PTAR called @ 1132-per Maggie, RN called by Levada Dy

## 2018-12-20 NOTE — ED Notes (Signed)
Pt given orange juice and sandwich 

## 2018-12-20 NOTE — Progress Notes (Signed)
Renal Navigator received call from Dr. Maricela Curet stating that patient missed her OP HD appointment today. Renal Navigator spoke with OP HD clinic/[facility] charge RN and rescheduled her for tomorrow at 7:00am. She needs to arrive at 6:40am. Patient lives at [facility]. Renal Navigator attempted to reach someone at [facility] to confirm that they would transport her to this appointment, but was transferred to many different staff and then lost the call. Renal Navigator called back, but was left on hold for an extended amount of time. Patient was instructed to tell the staff at [facility] about the appointment or contact the clinic to cancel if she does not plan to attend. Dr. Regenia Skeeter updated.  Alphonzo Cruise, [place] Renal Navigator [phone]

## 2018-12-21 LAB — NOVEL CORONAVIRUS, NAA (HOSP ORDER, SEND-OUT TO REF LAB; TAT 18-24 HRS): SARS-CoV-2, NAA: DETECTED — AB

## 2018-12-22 ENCOUNTER — Emergency Department (HOSPITAL_COMMUNITY)
Admission: EM | Admit: 2018-12-22 | Discharge: 2018-12-22 | Disposition: A | Discharge status: Home / Self Care | Payer: Medicare Other | Attending: Emergency Medicine | Admitting: Emergency Medicine

## 2018-12-22 ENCOUNTER — Emergency Department (HOSPITAL_COMMUNITY): Payer: Medicare Other

## 2018-12-22 ENCOUNTER — Other Ambulatory Visit: Payer: Self-pay

## 2018-12-22 DIAGNOSIS — Z992 Dependence on renal dialysis: Secondary | ICD-10-CM | POA: Insufficient documentation

## 2018-12-22 DIAGNOSIS — E1122 Type 2 diabetes mellitus with diabetic chronic kidney disease: Secondary | ICD-10-CM | POA: Insufficient documentation

## 2018-12-22 DIAGNOSIS — Z87891 Personal history of nicotine dependence: Secondary | ICD-10-CM | POA: Diagnosis not present

## 2018-12-22 DIAGNOSIS — Z79899 Other long term (current) drug therapy: Secondary | ICD-10-CM | POA: Insufficient documentation

## 2018-12-22 DIAGNOSIS — R197 Diarrhea, unspecified: Secondary | ICD-10-CM | POA: Insufficient documentation

## 2018-12-22 DIAGNOSIS — N186 End stage renal disease: Secondary | ICD-10-CM | POA: Insufficient documentation

## 2018-12-22 DIAGNOSIS — I2510 Atherosclerotic heart disease of native coronary artery without angina pectoris: Secondary | ICD-10-CM | POA: Insufficient documentation

## 2018-12-22 DIAGNOSIS — I132 Hypertensive heart and chronic kidney disease with heart failure and with stage 5 chronic kidney disease, or end stage renal disease: Secondary | ICD-10-CM | POA: Diagnosis not present

## 2018-12-22 DIAGNOSIS — Z86718 Personal history of other venous thrombosis and embolism: Secondary | ICD-10-CM | POA: Insufficient documentation

## 2018-12-22 DIAGNOSIS — I5042 Chronic combined systolic (congestive) and diastolic (congestive) heart failure: Secondary | ICD-10-CM | POA: Insufficient documentation

## 2018-12-22 DIAGNOSIS — R112 Nausea with vomiting, unspecified: Secondary | ICD-10-CM | POA: Diagnosis not present

## 2018-12-22 DIAGNOSIS — U071 COVID-19: Secondary | ICD-10-CM | POA: Diagnosis not present

## 2018-12-22 LAB — COMPREHENSIVE METABOLIC PANEL
ALT: 19 U/L (ref 0–44)
AST: 36 U/L (ref 15–41)
Albumin: 3.5 g/dL (ref 3.5–5.0)
Alkaline Phosphatase: 60 U/L (ref 38–126)
Anion gap: 20 — ABNORMAL HIGH (ref 5–15)
BUN: 36 mg/dL — ABNORMAL HIGH (ref 8–23)
CO2: 31 mmol/L (ref 22–32)
Calcium: 8.1 mg/dL — ABNORMAL LOW (ref 8.9–10.3)
Chloride: 88 mmol/L — ABNORMAL LOW (ref 98–111)
Creatinine, Ser: 7.85 mg/dL — ABNORMAL HIGH (ref 0.44–1.00)
GFR calc Af Amer: 5 mL/min — ABNORMAL LOW (ref >60)
GFR calc non Af Amer: 5 mL/min — ABNORMAL LOW (ref >60)
Glucose, Bld: 140 mg/dL — ABNORMAL HIGH (ref 70–99)
Potassium: 4 mmol/L (ref 3.5–5.1)
Sodium: 139 mmol/L (ref 135–145)
Total Bilirubin: 1.1 mg/dL (ref 0.3–1.2)
Total Protein: 7 g/dL (ref 6.5–8.1)

## 2018-12-22 LAB — CBC WITH DIFFERENTIAL/PLATELET
Abs Immature Granulocytes: 0.04 10*3/uL (ref 0.00–0.07)
Basophils Absolute: 0 10*3/uL (ref 0.0–0.1)
Basophils Relative: 0 %
Eosinophils Absolute: 0 10*3/uL (ref 0.0–0.5)
Eosinophils Relative: 0 %
HCT: 37.8 % (ref 36.0–46.0)
Hemoglobin: 12.2 g/dL (ref 12.0–15.0)
Immature Granulocytes: 1 %
Lymphocytes Relative: 12 %
Lymphs Abs: 0.8 10*3/uL (ref 0.7–4.0)
MCH: 32.6 pg (ref 26.0–34.0)
MCHC: 32.3 g/dL (ref 30.0–36.0)
MCV: 101.1 fL — ABNORMAL HIGH (ref 80.0–100.0)
Monocytes Absolute: 0.8 10*3/uL (ref 0.1–1.0)
Monocytes Relative: 11 %
Neutro Abs: 5.1 10*3/uL (ref 1.7–7.7)
Neutrophils Relative %: 76 %
Platelets: 97 10*3/uL — ABNORMAL LOW (ref 150–400)
RBC: 3.74 MIL/uL — ABNORMAL LOW (ref 3.87–5.11)
RDW: 14.8 % (ref 11.5–15.5)
WBC: 6.6 10*3/uL (ref 4.0–10.5)
nRBC: 0 % (ref 0.0–0.2)

## 2018-12-22 LAB — D-DIMER, QUANTITATIVE: D-Dimer, Quant: 5.25 ug/mL-FEU — ABNORMAL HIGH (ref 0.00–0.50)

## 2018-12-22 LAB — C-REACTIVE PROTEIN: CRP: 7.4 mg/dL — ABNORMAL HIGH (ref <1.0)

## 2018-12-22 LAB — CBG MONITORING, ED: Glucose-Capillary: 139 mg/dL — ABNORMAL HIGH (ref 70–99)

## 2018-12-22 LAB — LACTIC ACID, PLASMA: Lactic Acid, Venous: 1.4 mmol/L (ref 0.5–1.9)

## 2018-12-22 LAB — PROCALCITONIN: Procalcitonin: 9.44 ng/mL

## 2018-12-22 LAB — TRIGLYCERIDES: Triglycerides: 183 mg/dL — ABNORMAL HIGH (ref <150)

## 2018-12-22 LAB — LACTATE DEHYDROGENASE: LDH: 241 U/L — ABNORMAL HIGH (ref 98–192)

## 2018-12-22 LAB — FIBRINOGEN: Fibrinogen: 597 mg/dL — ABNORMAL HIGH (ref 210–475)

## 2018-12-22 LAB — FERRITIN: Ferritin: 2668 ng/mL — ABNORMAL HIGH (ref 11–307)

## 2018-12-22 MED ORDER — ONDANSETRON 4 MG PO TBDP
4.0000 mg | ORAL_TABLET | Freq: Once | ORAL | Status: AC
  Administered 2018-12-22: 4 mg via ORAL
  Filled 2018-12-22: qty 1

## 2018-12-22 MED ORDER — ONDANSETRON HCL 4 MG/2ML IJ SOLN: 4.0000 mg | Freq: Once | INTRAMUSCULAR | Status: DC

## 2018-12-22 MED ORDER — SODIUM CHLORIDE 0.9 % IV SOLN
1000.0000 mL | INTRAVENOUS | Status: DC
  Administered 2018-12-22: 1000 mL via INTRAVENOUS

## 2018-12-22 MED ORDER — PREDNISONE 20 MG PO TABS: ORAL_TABLET | ORAL | 0 refills | Status: AC

## 2018-12-22 MED ORDER — DEXAMETHASONE SODIUM PHOSPHATE 10 MG/ML IJ SOLN
10.0000 mg | Freq: Once | INTRAMUSCULAR | Status: AC
  Administered 2018-12-22: 10 mg via INTRAMUSCULAR
  Filled 2018-12-22: qty 1

## 2018-12-22 MED ORDER — METHYLPREDNISOLONE SODIUM SUCC 125 MG IJ SOLR: 125.0000 mg | Freq: Once | INTRAMUSCULAR | Status: DC

## 2018-12-22 NOTE — Discharge Instructions (Signed)
You have been tested for covid-19 and is positive.  Take steroid as prescribed and follow instruction below.

## 2018-12-22 NOTE — ED Notes (Signed)
IV team here to eval, pt has no obvious veins for start.  Reviewed labs and will reassess need later, will do US guided if needed.  Pt restful without any noted N/V.   None since arrival to ED.

## 2018-12-22 NOTE — ED Provider Notes (Signed)
[facility] EMERGENCY DEPARTMENT Provider Note   CSN: [id number] Arrival date & time: 12/22/18  1114     History   Chief Complaint No chief complaint on file.   HPI Stefanie [name] is a 73 y.o. female.     The history is provided by the patient and medical records. No language interpreter was used.     73 year old female with history of CAD, CHF, CVA with residual left-sided weakness, diabetes, end-stage renal disease currently on dialysis, protein malnutrition brought here via EMS from home for evaluation of COVID-19 symptoms.  For the past 5 days patient has had cough, chills, decrease in appetite, nausea vomiting diarrhea, and feeling lethargic.  Per EMS note, diarrhea was in the room at her facility.  Patient received 4 mg of Zofran prior to arrival.  Patient mention her last dialysis session was yesterday.  She denies loss of taste or smell and denies any significant shortness of breath.  She does not complain of any pain at this time.  Past Medical History:  Diagnosis Date  . Anemia   . CAD (coronary artery disease)   . CHF (congestive heart failure) ([place])   . Constipation   . CVA (cerebral vascular accident) ([place]) [place]   left sided weakness  . Diabetes mellitus   . End stage renal disease ([place])    initiated HD 2006  . Hx of cardiovascular stress test    a. MV 03/2010:  no ischemia, EF 55%  . Hx of echocardiogram    a. Echo 03/2010: EF 55-60%, Gr 1 diast dysfn, trivial MS, mild to mod LAE, PASP 34, ;  b. Echo 11/13: mild LVH, EF 60%, Gr 1 diast dysfn, Ao sclerosis, no AS, MAC, slight MS, mean 3 mmHg, PASP 34  . Hyperlipidemia   . Hypertension   . Protein malnutrition ([place])   . Renal insufficiency   . Retinal detachment, bilateral   . Secondary hyperparathyroidism [facility])     Patient Active Problem List   Diagnosis Date Noted  . Nausea & vomiting 07/30/2017  . Acute blood loss anemia 02/09/2016  . Hypotension due to blood loss 02/09/2016  .  Chronic combined systolic and diastolic CHF (congestive heart failure) ([place]) 02/09/2016  . Hemorrhage 02/08/2016  . Anemia 02/08/2016  . Left leg pain 04/10/2015  . Bacteremia 04/10/2015  . Lactic acidosis   . Cellulitis 04/09/2015  . History of CVA with residual deficit 02/11/2015  . Weakness generalized 02/08/2015  . DVT (deep venous thrombosis) ([place])   . CHF (congestive heart failure) ([place])   . Chronic diastolic congestive heart failure ([place])   . ESRD (end stage renal disease) ([place])   . Congestive dilated cardiomyopathy ([place])   . Left leg DVT ([place]) 02/01/2015  . Leukocytosis 02/01/2015  . Elevated troponin 02/01/2015  . SOB (shortness of breath) 02/01/2015  . Type 2 diabetes, controlled, with neuropathy ([place]) 12/20/2014  . Pain due to onychomycosis of toenail 12/20/2014  . 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 Mechanical complication of other vascular device, implant, and graft 05/19/2013  . PAD (peripheral artery disease) ([place]) 02/24/2012  . Murmur 12/24/2011  . Bradycardia-intermittent sinus 07/10/2010  . DYSPNEA ON EXERTION 02/25/2010  . CHEST PAIN, EXERTIONAL 02/25/2010  . DM 02/24/2010  . PROTEIN MALNUTRITION 02/24/2010  . HLD (hyperlipidemia) 02/24/2010  . ANEMIA 02/24/2010  . Essential hypertension 02/24/2010  . Cerebral artery occlusion with cerebral infarction ([place]) 02/24/2010  . ESRD on dialysis ([place]) 02/24/2010  . Secondary renal  hyperparathyroidism ([place]) 02/24/2010    Past Surgical History:  Procedure Laterality Date  . ARTERIOVENOUS GRAFT PLACEMENT     BUE numerous times  . ARTERIOVENOUS GRAFT PLACEMENT  11/13/10   Lt femoral - Dr. Kellie Simmering  . BIOPSY  08/03/2017   Procedure: BIOPSY;  Surgeon: Clarene Essex, MD;  Location: [facility];  Service: Endoscopy;;  . BRONCHIAL BRUSHINGS  08/03/2017   Procedure: Esophageal  BRUSHINGS;  Surgeon: Clarene Essex, MD;  Location: [facility];  Service: Endoscopy;;  esophageal brushing  . CARDIAC CATHETERIZATION N/A  02/05/2015   Procedure: Left Heart Cath and Coronary Angiography;  Surgeon: Jettie Booze, MD;  Location: [facility] CV LAB;  Service: Cardiovascular;  Laterality: N/A;  . CATARACT EXTRACTION    . DG AV DIALYSIS GRAFT DECLOT OR  06/06/11, 08/26/11   performed in IR  . ESOPHAGOGASTRODUODENOSCOPY N/A 08/03/2017   Procedure: ESOPHAGOGASTRODUODENOSCOPY (EGD);  Surgeon: Clarene Essex, MD;  Location: [facility];  Service: Endoscopy;  Laterality: N/A;  . PERIPHERAL VASCULAR CATHETERIZATION N/A 02/06/2015   Procedure: A/V Shuntogram;  Surgeon: Elam Dutch, MD;  Location: [facility] CV LAB;  Service: Cardiovascular;  Laterality: N/A;  . PERIPHERAL VASCULAR CATHETERIZATION Left 02/26/2016   Procedure: A/V Shuntogram;  Surgeon: Waynetta Sandy, MD;  Location: [facility] CV LAB;  Service: Cardiovascular;  Laterality: Left;  . PERIPHERAL VASCULAR CATHETERIZATION Left 02/26/2016   Procedure: Peripheral Vascular Balloon Angioplasty;  Surgeon: Waynetta Sandy, MD;  Location: [facility] CV LAB;  Service: Cardiovascular;  Laterality: Left;  Lt thigh avf  . SHUNTOGRAM N/A 05/29/2013   Procedure: Earney Mallet;  Surgeon: Conrad [place], MD;  Location: [facility] CATH LAB;  Service: Cardiovascular;  Laterality: N/A;  . SHUNTOGRAM Left 03/01/2014   Procedure: Earney Mallet;  Surgeon: Conrad Penuelas, MD;  Location[facility] CATH LAB;  Service: Cardiovascular;  Laterality: Left;  . TUBAL LIGATION       OB History   No obstetric history on file.      Home Medications    Prior to Admission medications   Medication Sig Start Date End Date Taking? Authorizing Provider  acetaminophen (TYLENOL) 325 MG tablet Take 650 mg by mouth 3 (three) times daily.     [provider]  b complex-vitamin c-folic acid (NEPHRO-VITE) 0.8 MG TABS tablet Take 1 tablet by mouth See admin instructions. Every Monday, Wednesday and Friday    [provider]  calcium acetate (PHOSLO) 667 MG capsule Take 667-2,001 mg  by mouth See admin instructions. Take 3 capsules three times a day with meals and take 1 capsule twice daily with snacks    [provider]  cinacalcet (SENSIPAR) 30 MG tablet Take 1 tablet (30 mg total) by mouth Every Tuesday,Thursday,and Saturday with dialysis. 08/05/17   Hosie Poisson, MD  Darbepoetin Alfa (ARANESP) 100 MCG/0.5ML SOSY injection Inject 0.5 mLs (100 mcg total) into the vein every Thursday with hemodialysis. 08/05/17   Hosie Poisson, MD  diclofenac sodium (VOLTAREN) 1 % GEL Apply 2 g topically See admin instructions. Apply to both knees daily as needed for pain and to right hand 2-3 times a day as needed for pain (BRAND NAME VOLTAREN)    [provider]  glipiZIDE (GLUCOTROL) 10 MG tablet Take 10 mg by mouth 2 (two) times daily before a meal.     [provider]  latanoprost (XALATAN) 0.005 % ophthalmic solution Place 1 drop into both eyes at bedtime.     [provider]  LEVEMIR FLEXTOUCH 100 UNIT/ML Pen 10  Units at bedtime. 09/15/17   [provider]  lidocaine-prilocaine (EMLA) cream Apply 1 application topically as needed (topical anesthesia for hemodialysis if Gebauers and Lidocaine injection are ineffective.). 02/25/15   Love, Ivan Anchors, PA-C  loratadine (CLARITIN) 10 MG tablet Take 10 mg by mouth daily.    [provider]  Melatonin 5 MG TABS Take 5 mg by mouth at bedtime.    [provider]  neomycin-bacitracin-polymyxin (NEOSPORIN) ointment Apply 1 application topically 2 (two) times daily as needed for wound care.     [provider]  pantoprazole (PROTONIX) 40 MG tablet Take 1 tablet (40 mg total) by mouth 2 (two) times daily. 08/05/17   Hosie Poisson, MD  polyethylene glycol [facility]) packet Use 17 gms three times daily until bowels move, with maximum of 3 consecutive days Patient taking differently: Take 17 g by mouth 3 (three) times daily as needed for mild constipation, moderate constipation or severe  constipation.  09/02/15   Charlann Lange, PA-C  pravastatin (PRAVACHOL) 20 MG tablet Take 20 mg by mouth at bedtime.    [provider]  rOPINIRole (REQUIP) 1 MG tablet Take 1 mg by mouth at bedtime.     [provider]  sennosides-docusate sodium (SENOKOT-S) 8.6-50 MG tablet Take 2 tablets by mouth daily.     [provider]  sorbitol 70 % solution Take 30 mLs by mouth daily as needed (for constipation).    [provider]  traZODone (DESYREL) 50 MG tablet Take 25 mg by mouth at bedtime as needed for sleep.  07/06/17   [provider]    Family History Family History  Problem Relation Age of Onset  . Heart disease Mother   . Diabetes Mother   . Hypertension Mother   . Heart attack Mother   . Diabetes Other   . Kidney disease Other   . Cardiomyopathy Other   . Diabetes Daughter   . Diabetes Son   . Hypertension Son     Social History Social History   Tobacco Use  . Smoking status: Former Smoker    Quit date: 03/17/1983    Years since quitting: 35.7  . Smokeless tobacco: Never Used  Substance Use Topics  . Alcohol use: No  . Drug use: No     Allergies   Patient has no known allergies.   Review of Systems Review of Systems  All other systems reviewed and are negative.    Physical Exam Updated Vital Signs BP 129/73 (BP Location: Right Arm)   Pulse 89   Temp 99.8 F (37.7 C) (Oral)   Resp 20   Ht _0  (1.626 m)   Wt 77.6 kg   SpO2 99%   BMI 29.35 kg/m   Physical Exam Vitals signs and nursing note reviewed.  Constitutional:      General: She is not in acute distress.    Appearance: She is well-developed.     Comments: Patient appears drowsy but easily arousable.  She appears to be chronically ill.  HENT:     Head: Atraumatic.     Mouth/Throat:     Mouth: Mucous membranes are dry.  Eyes:     Extraocular Movements: Extraocular movements intact.     Conjunctiva/sclera: Conjunctivae normal.     Pupils: Pupils  are equal, round, and reactive to light.  Neck:     Musculoskeletal: Neck supple. No neck rigidity.  Cardiovascular:     Rate and Rhythm: Normal rate and regular rhythm.  Pulses: Normal pulses.     Heart sounds: Normal heart sounds.  Pulmonary:     Effort: Pulmonary effort is normal.     Breath sounds: Rhonchi present.  Abdominal:     Palpations: Abdomen is soft.     Tenderness: There is no abdominal tenderness.  Skin:    Findings: No rash.  Neurological:     Comments: Drowsy but answer question.  Patient is alert and oriented x2.  Psychiatric:        Mood and Affect: Mood normal.      ED Treatments / Results  Labs (all labs ordered are listed, but only abnormal results are displayed) Labs Reviewed  CBC WITH DIFFERENTIAL/PLATELET - Abnormal; Notable for the following components:      Result Value   RBC 3.74 (*)    MCV 101.1 (*)    Platelets 97 (*)    All other components within normal limits  COMPREHENSIVE METABOLIC PANEL - Abnormal; Notable for the following components:   Chloride 88 (*)    Glucose, Bld 140 (*)    BUN 36 (*)    Creatinine, Ser 7.85 (*)    Calcium 8.1 (*)    GFR calc non Af Amer 5 (*)    GFR calc Af Amer 5 (*)    Anion gap 20 (*)    All other components within normal limits  D-DIMER, QUANTITATIVE (NOT AT [facility]) - Abnormal; Notable for the following components:   D-Dimer, Quant 5.25 (*)    All other components within normal limits  LACTATE DEHYDROGENASE - Abnormal; Notable for the following components:   LDH 241 (*)    All other components within normal limits  FERRITIN - Abnormal; Notable for the following components:   Ferritin 2,668 (*)    All other components within normal limits  FIBRINOGEN - Abnormal; Notable for the following components:   Fibrinogen 597 (*)    All other components within normal limits  C-REACTIVE PROTEIN - Abnormal; Notable for the following components:   CRP 7.4 (*)    All other components within normal limits   TRIGLYCERIDES - Abnormal; Notable for the following components:   Triglycerides 183 (*)    All other components within normal limits  CBG MONITORING, ED - Abnormal; Notable for the following components:   Glucose-Capillary 139 (*)    All other components within normal limits  CULTURE, BLOOD (ROUTINE X 2)  CULTURE, BLOOD (ROUTINE X 2)  LACTIC ACID, PLASMA  PROCALCITONIN  LACTIC ACID, PLASMA    EKG None  Radiology Dg Chest Port 1 View  Result Date: 12/22/2018 CLINICAL DATA:  COVID-19 positive.  Shortness of breath. EXAM: PORTABLE CHEST 1 VIEW COMPARISON:  12/20/2018 and prior radiographs FINDINGS: Cardiomegaly again noted. There is no evidence of focal airspace disease, pulmonary edema, suspicious pulmonary nodule/mass, pleural effusion, or pneumothorax. Linear metallic structure overlying the UPPER RIGHT chest again noted. LEFT axillary stent identified. No acute bony abnormalities are identified. IMPRESSION: Cardiomegaly without evidence of acute cardiopulmonary disease. Electronically Signed   By: Margarette Canada M.D.   On: 12/22/2018 12:23    Procedures Procedures (including critical care time)  Medications Ordered in ED Medications  0.9 %  sodium chloride infusion (1,000 mLs Intravenous New Bag/Given 12/22/18 1300)  ondansetron (ZOFRAN) injection 4 mg (has no administration in time range)  methylPREDNISolone sodium succinate (SOLU-MEDROL) 125 mg/2 mL injection 125 mg (has no administration in time range)     Initial Impression / Assessment and Plan / ED Course  I have reviewed the triage vital signs and the nursing notes.  Pertinent labs & imaging results that were available during my care of the patient were reviewed by me and considered in my medical decision making (see chart for details).        BP (!) 111/55   Pulse 89   Temp 99.8 F (37.7 C) (Oral)   Resp (!) 28   Ht _0  (1.626 m)   Wt 77.6 kg   SpO2 99%   BMI 29.35 kg/m    Final Clinical Impressions(s) /  ED Diagnoses   Final diagnoses:  Nausea vomiting and diarrhea  COVID-19 virus infection    ED Discharge Orders    None     This is a dialysis patient who has had cold-like symptoms ongoing for the past 5 days, was seen 2 days ago for her symptoms and that her COVID test came back positive.  She is brought here due to nausea vomiting diarrhea increased lethargy likely secondary to her current infection.  Labs remarkable for elevated inflammatory markers such as elevated d-dimer 5.25, LDH 241, ferritin 2668, fibrinogen 597, CRP 7.4, triglyceride 183.  Chest x-ray without any acute finding.  She has low-grade temps and was tachypneic however not hypoxic.  Last dialysis was yesterday.  Appreciate consultation from [facility] hospitalist Dr. Laren Everts who agrees to admit pt for intractable n/v and delirium 2/2 covid-19.  I have also reach out to pt's daughter through the phone to give her an update.    3:17 PM [facility] Hospitalist Dr. Laren Everts have evaluated pt and felt pt does not meet criteria for admission.  Pt is currently living at a SNF, and is DNR.  She is not hypoxic and she is able to tolerates PO.  He recommend steroid taper as treatment.  Recommend steroid taper. I reached out to [facility] to notify the staff of her arrival back to the facility.     Stefanie [name] was evaluated in Emergency Department on 12/22/2018 for the symptoms described in the history of present illness. She was evaluated in the context of the global COVID-19 pandemic, which necessitated consideration that the patient might be at risk for infection with the SARS-CoV-2 virus that causes COVID-19. Institutional protocols and algorithms that pertain to the evaluation of patients at risk for COVID-19 are in a state of rapid change based on information released by regulatory bodies including the CDC and federal and state organizations. These policies and algorithms were followed during the patient's care in the ED.    Domenic Moras, PA-C 12/22/18 [place], [address], MD 12/23/18 [phone]

## 2018-12-22 NOTE — ED Notes (Signed)
Called [facility] for pt transport back to [facility]

## 2018-12-22 NOTE — H&P (Signed)
[facility]                                                                                    Patient Demographics  Stefanie [name], is a 73 y.o. female  CSN: [id number]  MRN: [id number]  DOB - [birth year]  Admit Date - 12/22/2018  Outpatient Primary MD for the patient is [name], [name], MD   With History of -  Past Medical History:  Diagnosis Date  . Anemia   . CAD (coronary artery disease)   . CHF (congestive heart failure) ([place])   . Constipation   . CVA (cerebral vascular accident) ([place]) [place]   left sided weakness  . Diabetes mellitus   . End stage renal disease ([place])    initiated HD 2006  . Hx of cardiovascular stress test    a. MV 03/2010:  no ischemia, EF 55%  . Hx of echocardiogram    a. Echo 03/2010: EF 55-60%, Gr 1 diast dysfn, trivial MS, mild to mod LAE, PASP 34, ;  b. Echo 11/13: mild LVH, EF 60%, Gr 1 diast dysfn, Ao sclerosis, no AS, MAC, slight MS, mean 3 mmHg, PASP 34  . Hyperlipidemia   . Hypertension   . Protein malnutrition ([place])   . Renal insufficiency   . Retinal detachment, bilateral   . Secondary hyperparathyroidism ([place])       Past Surgical History:  Procedure Laterality Date  . ARTERIOVENOUS GRAFT PLACEMENT     BUE numerous times  . ARTERIOVENOUS GRAFT PLACEMENT  11/13/10   Lt femoral - Dr. Kellie Simmering  . BIOPSY  08/03/2017   Procedure: BIOPSY;  Surgeon: Clarene Essex, MD;  Location: [place];  Service: Endoscopy;;  . BRONCHIAL BRUSHINGS  08/03/2017   Procedure: Esophageal  BRUSHINGS;  Surgeon: Clarene Essex, MD;  Location: [place];  Service: Endoscopy;;  esophageal brushing  . CARDIAC CATHETERIZATION N/A 02/05/2015   Procedure: Left Heart Cath and Coronary Angiography;  Surgeon: Jettie Booze, MD;  Location: [place] [facility];  Service: Cardiovascular;  Laterality: N/A;  . CATARACT EXTRACTION    . DG AV DIALYSIS GRAFT DECLOT OR  06/06/11, 08/26/11   performed in IR  . ESOPHAGOGASTRODUODENOSCOPY N/A 08/03/2017    Procedure: ESOPHAGOGASTRODUODENOSCOPY (EGD);  Surgeon: Clarene Essex, MD;  Location: [place];  Service: Endoscopy;  Laterality: N/A;  . PERIPHERAL VASCULAR CATHETERIZATION N/A 02/06/2015   Procedure: A/V Shuntogram;  Surgeon: Elam Dutch, MD;  Location: [place] [facility];  Service: Cardiovascular;  Laterality: N/A;  . PERIPHERAL VASCULAR CATHETERIZATION Left 02/26/2016   Procedure: A/V Shuntogram;  Surgeon: Waynetta Sandy, MD;  Location: [place] [facility];  Service: Cardiovascular;  Laterality: Left;  . PERIPHERAL VASCULAR CATHETERIZATION Left 02/26/2016   Procedure: Peripheral Vascular Balloon Angioplasty;  Surgeon: Waynetta Sandy, MD;  Location: [place] [facility];  Service: Cardiovascular;  Laterality: Left;  Lt thigh avf  . SHUNTOGRAM N/A 05/29/2013   Procedure: Earney Mallet;  Surgeon: Conrad Prineville, MD;  Location: [facility] CATH LAB;  Service: Cardiovascular;  Laterality: N/A;  . SHUNTOGRAM Left 03/01/2014   Procedure: Earney Mallet;  Surgeon: Conrad Telluride, MD;  Location: [facility] CATH LAB;  Service: Cardiovascular;  Laterality: Left;  . TUBAL LIGATION      in for   No chief complaint on file.    HPI  Stefanie [name]  is a 73 y.o. female, nursing home resident with past medical history significant for ESRD on hemodialysis, CAD, CHF and malnutrition who was sent today for evaluation of GI symptoms from nursing home.  She tested positive for COVID-19 2 days ago.  There was report of nausea vomiting and diarrhea.  No chest pains or shortness of breath. Work-up in the emergency room showed white blood cell count of 6.6, hemoglobin 12.2 platelets 97,000.  Inflammatory markers were increased. Patient had dialysis yesterday. No new oxygen requirement in the emergency room.    Review of Systems    Denies any chest pains or shortness of breath Positive for nausea vomiting No urinary symptoms No cough or shortness of breath Mild diarrhea No new skin rashes or ulcers No  headaches or dizziness Slight mental confusion noted   Social History Social History   Tobacco Use  . Smoking status: Former Smoker    Quit date: 03/17/1983    Years since quitting: 35.7  . Smokeless tobacco: Never Used  Substance Use Topics  . Alcohol use: No     Family History Family History  Problem Relation Age of Onset  . Heart disease Mother   . Diabetes Mother   . Hypertension Mother   . Heart attack Mother   . Diabetes Other   . Kidney disease Other   . Cardiomyopathy Other   . Diabetes Daughter   . Diabetes Son   . Hypertension Son      Prior to Admission medications   Medication Sig Start Date End Date Taking? Authorizing Provider  acetaminophen (TYLENOL) 325 MG tablet Take 650 mg by mouth 3 (three) times daily.     [provider]  b complex-vitamin c-folic acid (NEPHRO-VITE) 0.8 MG TABS tablet Take 1 tablet by mouth See admin instructions. Every Monday, Wednesday and Friday    [provider]  calcium acetate (PHOSLO) 667 MG capsule Take 667-2,001 mg by mouth See admin instructions. Take 3 capsules three times a day with meals and take 1 capsule twice daily with snacks    [provider]  cinacalcet (SENSIPAR) 30 MG tablet Take 1 tablet (30 mg total) by mouth Every Tuesday,Thursday,and Saturday with dialysis. 08/05/17   Hosie Poisson, MD  Darbepoetin Alfa (ARANESP) 100 MCG/0.5ML SOSY injection Inject 0.5 mLs (100 mcg total) into the vein every Thursday with hemodialysis. 08/05/17   Hosie Poisson, MD  diclofenac sodium (VOLTAREN) 1 % GEL Apply 2 g topically See admin instructions. Apply to both knees daily as needed for pain and to right hand 2-3 times a day as needed for pain (BRAND NAME VOLTAREN)    [provider]  glipiZIDE (GLUCOTROL) 10 MG tablet Take 10 mg by mouth 2 (two) times daily before a meal.     [provider]  latanoprost (XALATAN) 0.005 % ophthalmic solution Place 1 drop into both eyes at bedtime.      [provider]  LEVEMIR FLEXTOUCH 100 UNIT/ML Pen 10 Units at bedtime. 09/15/17   [provider]  lidocaine-prilocaine (EMLA) cream Apply 1 application topically as needed (topical anesthesia for hemodialysis if Gebauers and Lidocaine injection are ineffective.). 02/25/15   Love, Ivan Anchors, PA-C  loratadine (CLARITIN) 10 MG tablet Take 10 mg by mouth daily.    [provider]  Melatonin 5 MG TABS  Take 5 mg by mouth at bedtime.    [provider]  neomycin-bacitracin-polymyxin (NEOSPORIN) ointment Apply 1 application topically 2 (two) times daily as needed for wound care.     [provider]  pantoprazole (PROTONIX) 40 MG tablet Take 1 tablet (40 mg total) by mouth 2 (two) times daily. 08/05/17   Hosie Poisson, MD  polyethylene glycol [facility]) packet Use 17 gms three times daily until bowels move, with maximum of 3 consecutive days Patient taking differently: Take 17 g by mouth 3 (three) times daily as needed for mild constipation, moderate constipation or severe constipation.  09/02/15   Charlann Lange, PA-C  pravastatin (PRAVACHOL) 20 MG tablet Take 20 mg by mouth at bedtime.    [provider]  rOPINIRole (REQUIP) 1 MG tablet Take 1 mg by mouth at bedtime.     [provider]  sennosides-docusate sodium (SENOKOT-S) 8.6-50 MG tablet Take 2 tablets by mouth daily.     [provider]  sorbitol 70 % solution Take 30 mLs by mouth daily as needed (for constipation).    [provider]  traZODone (DESYREL) 50 MG tablet Take 25 mg by mouth at bedtime as needed for sleep.  07/06/17   [provider]    No Known Allergies  Physical Exam  Vitals  Blood pressure (!) 111/55, pulse 89, temperature 99.8 F (37.7 C), temperature source Oral, resp. rate (!) 28, height _0  (1.626 m), weight 77.6 kg, SpO2 99 %.   General appearance looks confused and tired HEENT no jaundice or pallor, no facial deviation Neck supple,  no neck vein distention Chest mild scattered rhonchi Heart normal S1-S2, tachycardic Abdomen soft, nontender, bowel sounds present Extremities no clubbing cyanosis or edema Skin no rashes or ulcers  Data Review  CBC Recent Labs  Lab 12/20/18 0903 12/22/18 1200  WBC 10.7* 6.6  HGB 12.3 12.2  HCT 39.9 37.8  PLT PLATELET CLUMPS NOTED ON SMEAR, COUNT APPEARS DECREASED 97*  MCV 102.0* 101.1*  MCH 31.5 32.6  MCHC 30.8 32.3  RDW 15.0 14.8  LYMPHSABS  --  0.8  MONOABS  --  0.8  EOSABS  --  0.0  BASOSABS  --  0.0   ------------------------------------------------------------------------------------------------------------------  Chemistries  Recent Labs  Lab 12/20/18 0903 12/22/18 1200  NA 140 139  K 4.1 4.0  CL 88* 88*  CO2 31 31  GLUCOSE 53* 140*  BUN 44* 36*  CREATININE 10.45* 7.85*  CALCIUM 8.4* 8.1*  AST  --  36  ALT  --  19  ALKPHOS  --  60  BILITOT  --  1.1   ------------------------------------------------------------------------------------------------------------------ estimated creatinine clearance is 6.4 mL/min (A) (by C-G formula based on SCr of 7.85 mg/dL (H)). ------------------------------------------------------------------------------------------------------------------ No results for input(s): TSH, T4TOTAL, T3FREE, THYROIDAB in the last 72 hours.  Invalid input(s): FREET3   Coagulation profile No results for input(s): INR, PROTIME in the last 168 hours. ------------------------------------------------------------------------------------------------------------------- Recent Labs    12/22/18 1200  DDIMER 5.25*   -------------------------------------------------------------------------------------------------------------------  Cardiac Enzymes No results for input(s): CKMB, TROPONINI, MYOGLOBIN in the last 168 hours.  Invalid input(s):  CK ------------------------------------------------------------------------------------------------------------------ Invalid input(s): POCBNP   ---------------------------------------------------------------------------------------------------------------  Urinalysis No results found for: COLORURINE, APPEARANCEUR, LABSPEC, PHURINE, GLUCOSEU, HGBUR, BILIRUBINUR, KETONESUR, PROTEINUR, UROBILINOGEN, NITRITE, LEUKOCYTESUR  ----------------------------------------------------------------------------------------------------------------   Imaging results:   Dg Chest Port 1 View  Result Date: 12/22/2018 CLINICAL DATA:  COVID-19 positive.  Shortness of breath. EXAM: PORTABLE CHEST 1 VIEW COMPARISON:  12/20/2018 and prior radiographs FINDINGS: Cardiomegaly  again noted. There is no evidence of focal airspace disease, pulmonary edema, suspicious pulmonary nodule/mass, pleural effusion, or pneumothorax. Linear metallic structure overlying the UPPER RIGHT chest again noted. LEFT axillary stent identified. No acute bony abnormalities are identified. IMPRESSION: Cardiomegaly without evidence of acute cardiopulmonary disease. Electronically Signed   By: Margarette Canada M.D.   On: 12/22/2018 12:23   Dg Chest Port 1 View  Result Date: 12/20/2018 CLINICAL DATA:  Cough. EXAM: PORTABLE CHEST 1 VIEW COMPARISON:  Radiographs of August 24, 2017. FINDINGS: Stable cardiomegaly. No pneumothorax or pleural effusion is noted. No acute pulmonary disease is noted. Bony thorax is unremarkable. Stable linear metallic density is seen over the right lung apex consistent with foreign body. IMPRESSION: No acute cardiopulmonary abnormality seen. Stable linear metallic density is seen over right lung apex consistent with foreign body. Electronically Signed   By: Marijo Conception M.D.   On: 12/20/2018 10:02      Assessment & Plan  Acute COVID-19 infection with no new oxygen requirement Discussed with ER PA. We will send her back to  her skilled facility on steroids Patient will receive IV steroids first dose in our facility and will monitor  History of ESRD on hemodialysis Follow-up with nephrology  Diabetes mellitus Continue with p.o. treatment  Code Status DNR  Disposition Plan: Back to skilled nursing facility  Time spent in minutes : 41 minutes     _0 @

## 2018-12-22 NOTE — ED Triage Notes (Signed)
Pt tested positive for Covid.  Since Saturday having N/V/ D, loss of appetite with recurrent BG drop.  No pain or acute resp distress noted.  Pt appears lethargic, previous stroke with R side weakness and wc bound.  Has had vomiting with no intake and EMS found diarrhea in room on arrival.  Pt had Zofran 4 mg and has 22 g IV R hand.  Dialysis pt access to R groin and two old sites to each UE.

## 2018-12-27 ENCOUNTER — Other Ambulatory Visit: Payer: Self-pay

## 2018-12-27 ENCOUNTER — Emergency Department (HOSPITAL_COMMUNITY): Payer: Medicare Other

## 2018-12-27 ENCOUNTER — Inpatient Hospital Stay (HOSPITAL_COMMUNITY)
Admission: EM | Admit: 2018-12-27 | Discharge: 2019-01-15 | DRG: 270 | Disposition: E | Payer: Medicare Other | Source: Skilled Nursing Facility | Attending: Pulmonary Disease | Admitting: Pulmonary Disease

## 2018-12-27 DIAGNOSIS — I959 Hypotension, unspecified: Secondary | ICD-10-CM

## 2018-12-27 DIAGNOSIS — I132 Hypertensive heart and chronic kidney disease with heart failure and with stage 5 chronic kidney disease, or end stage renal disease: Secondary | ICD-10-CM | POA: Diagnosis present

## 2018-12-27 DIAGNOSIS — D638 Anemia in other chronic diseases classified elsewhere: Secondary | ICD-10-CM | POA: Diagnosis present

## 2018-12-27 DIAGNOSIS — I69354 Hemiplegia and hemiparesis following cerebral infarction affecting left non-dominant side: Secondary | ICD-10-CM

## 2018-12-27 DIAGNOSIS — J440 Chronic obstructive pulmonary disease with acute lower respiratory infection: Secondary | ICD-10-CM | POA: Diagnosis present

## 2018-12-27 DIAGNOSIS — A419 Sepsis, unspecified organism: Secondary | ICD-10-CM

## 2018-12-27 DIAGNOSIS — I480 Paroxysmal atrial fibrillation: Principal | ICD-10-CM | POA: Diagnosis present

## 2018-12-27 DIAGNOSIS — I4891 Unspecified atrial fibrillation: Secondary | ICD-10-CM

## 2018-12-27 DIAGNOSIS — E785 Hyperlipidemia, unspecified: Secondary | ICD-10-CM | POA: Diagnosis present

## 2018-12-27 DIAGNOSIS — R531 Weakness: Secondary | ICD-10-CM | POA: Diagnosis not present

## 2018-12-27 DIAGNOSIS — J441 Chronic obstructive pulmonary disease with (acute) exacerbation: Secondary | ICD-10-CM | POA: Diagnosis not present

## 2018-12-27 DIAGNOSIS — N2581 Secondary hyperparathyroidism of renal origin: Secondary | ICD-10-CM | POA: Diagnosis present

## 2018-12-27 DIAGNOSIS — I472 Ventricular tachycardia: Secondary | ICD-10-CM | POA: Diagnosis not present

## 2018-12-27 DIAGNOSIS — U071 COVID-19: Secondary | ICD-10-CM | POA: Diagnosis present

## 2018-12-27 DIAGNOSIS — J1282 Pneumonia due to coronavirus disease 2019: Secondary | ICD-10-CM

## 2018-12-27 DIAGNOSIS — I9589 Other hypotension: Secondary | ICD-10-CM | POA: Diagnosis present

## 2018-12-27 DIAGNOSIS — Z794 Long term (current) use of insulin: Secondary | ICD-10-CM

## 2018-12-27 DIAGNOSIS — E1165 Type 2 diabetes mellitus with hyperglycemia: Secondary | ICD-10-CM | POA: Diagnosis not present

## 2018-12-27 DIAGNOSIS — Z8249 Family history of ischemic heart disease and other diseases of the circulatory system: Secondary | ICD-10-CM

## 2018-12-27 DIAGNOSIS — I2510 Atherosclerotic heart disease of native coronary artery without angina pectoris: Secondary | ICD-10-CM | POA: Diagnosis present

## 2018-12-27 DIAGNOSIS — I5042 Chronic combined systolic (congestive) and diastolic (congestive) heart failure: Secondary | ICD-10-CM | POA: Diagnosis present

## 2018-12-27 DIAGNOSIS — J9601 Acute respiratory failure with hypoxia: Secondary | ICD-10-CM

## 2018-12-27 DIAGNOSIS — Z66 Do not resuscitate: Secondary | ICD-10-CM | POA: Diagnosis present

## 2018-12-27 DIAGNOSIS — E8889 Other specified metabolic disorders: Secondary | ICD-10-CM | POA: Diagnosis present

## 2018-12-27 DIAGNOSIS — E1151 Type 2 diabetes mellitus with diabetic peripheral angiopathy without gangrene: Secondary | ICD-10-CM | POA: Diagnosis present

## 2018-12-27 DIAGNOSIS — Z87891 Personal history of nicotine dependence: Secondary | ICD-10-CM

## 2018-12-27 DIAGNOSIS — J1289 Other viral pneumonia: Secondary | ICD-10-CM | POA: Diagnosis present

## 2018-12-27 DIAGNOSIS — T17990A Other foreign object in respiratory tract, part unspecified in causing asphyxiation, initial encounter: Secondary | ICD-10-CM | POA: Diagnosis not present

## 2018-12-27 DIAGNOSIS — N186 End stage renal disease: Secondary | ICD-10-CM | POA: Diagnosis present

## 2018-12-27 DIAGNOSIS — Z992 Dependence on renal dialysis: Secondary | ICD-10-CM

## 2018-12-27 DIAGNOSIS — E1122 Type 2 diabetes mellitus with diabetic chronic kidney disease: Secondary | ICD-10-CM | POA: Diagnosis present

## 2018-12-27 DIAGNOSIS — R0902 Hypoxemia: Secondary | ICD-10-CM

## 2018-12-27 DIAGNOSIS — I998 Other disorder of circulatory system: Secondary | ICD-10-CM | POA: Diagnosis present

## 2018-12-27 DIAGNOSIS — Z978 Presence of other specified devices: Secondary | ICD-10-CM

## 2018-12-27 DIAGNOSIS — Z833 Family history of diabetes mellitus: Secondary | ICD-10-CM

## 2018-12-27 DIAGNOSIS — Z79899 Other long term (current) drug therapy: Secondary | ICD-10-CM

## 2018-12-27 LAB — CBC WITH DIFFERENTIAL/PLATELET
Abs Immature Granulocytes: 0.35 10*3/uL — ABNORMAL HIGH (ref 0.00–0.07)
Basophils Absolute: 0.1 10*3/uL (ref 0.0–0.1)
Basophils Relative: 0 %
Eosinophils Absolute: 0 10*3/uL (ref 0.0–0.5)
Eosinophils Relative: 0 %
HCT: 37.9 % (ref 36.0–46.0)
Hemoglobin: 11.7 g/dL — ABNORMAL LOW (ref 12.0–15.0)
Immature Granulocytes: 3 %
Lymphocytes Relative: 4 %
Lymphs Abs: 0.5 10*3/uL — ABNORMAL LOW (ref 0.7–4.0)
MCH: 31.6 pg (ref 26.0–34.0)
MCHC: 30.9 g/dL (ref 30.0–36.0)
MCV: 102.4 fL — ABNORMAL HIGH (ref 80.0–100.0)
Monocytes Absolute: 0.3 10*3/uL (ref 0.1–1.0)
Monocytes Relative: 2 %
Neutro Abs: 12.2 10*3/uL — ABNORMAL HIGH (ref 1.7–7.7)
Neutrophils Relative %: 91 %
Platelets: 156 10*3/uL (ref 150–400)
RBC: 3.7 MIL/uL — ABNORMAL LOW (ref 3.87–5.11)
RDW: 14.2 % (ref 11.5–15.5)
WBC: 13.4 10*3/uL — ABNORMAL HIGH (ref 4.0–10.5)
nRBC: 0 % (ref 0.0–0.2)

## 2018-12-27 LAB — LACTATE DEHYDROGENASE: LDH: 357 U/L — ABNORMAL HIGH (ref 98–192)

## 2018-12-27 LAB — CULTURE, BLOOD (ROUTINE X 2)
Culture: NO GROWTH
Culture: NO GROWTH
Special Requests: ADEQUATE
Special Requests: ADEQUATE

## 2018-12-27 LAB — CBG MONITORING, ED: Glucose-Capillary: 260 mg/dL — ABNORMAL HIGH (ref 70–99)

## 2018-12-27 LAB — TRIGLYCERIDES: Triglycerides: 393 mg/dL — ABNORMAL HIGH (ref <150)

## 2018-12-27 LAB — C-REACTIVE PROTEIN: CRP: 6.3 mg/dL — ABNORMAL HIGH (ref <1.0)

## 2018-12-27 LAB — LACTIC ACID, PLASMA
Lactic Acid, Venous: 2.8 mmol/L (ref 0.5–1.9)
Lactic Acid, Venous: 2.3 mmol/L (ref 0.5–1.9)

## 2018-12-27 LAB — TROPONIN I (HIGH SENSITIVITY): Troponin I (High Sensitivity): 222 ng/L (ref <18)

## 2018-12-27 LAB — D-DIMER, QUANTITATIVE: D-Dimer, Quant: 3.21 ug/mL-FEU — ABNORMAL HIGH (ref 0.00–0.50)

## 2018-12-27 LAB — FIBRINOGEN: Fibrinogen: 472 mg/dL (ref 210–475)

## 2018-12-27 MED ORDER — SODIUM CHLORIDE 0.9 % IV BOLUS (SEPSIS)
500.0000 mL | Freq: Once | INTRAVENOUS | Status: AC
  Administered 2018-12-27: 19:00:00 500 mL via INTRAVENOUS

## 2018-12-27 MED ORDER — SODIUM CHLORIDE 0.9 % IV SOLN
500.0000 mg | Freq: Once | INTRAVENOUS | Status: AC
  Administered 2018-12-27: 500 mg via INTRAVENOUS
  Filled 2018-12-27: qty 500

## 2018-12-27 MED ORDER — SODIUM CHLORIDE 0.9 % IV SOLN
1.0000 g | Freq: Once | INTRAVENOUS | Status: AC
  Administered 2018-12-27: 1 g via INTRAVENOUS
  Filled 2018-12-27: qty 10

## 2018-12-27 MED ORDER — SODIUM CHLORIDE 0.9 % IV BOLUS: 500.0000 mL | Freq: Once | INTRAVENOUS | Status: DC

## 2018-12-27 MED ORDER — DILTIAZEM HCL-DEXTROSE 125-5 MG/125ML-% IV SOLN (PREMIX)
5.0000 mg/h | INTRAVENOUS | Status: DC
  Administered 2018-12-27: 20:00:00 5 mg/h via INTRAVENOUS
  Administered 2018-12-28: 09:00:00 10 mg/h via INTRAVENOUS
  Filled 2018-12-27 (×2): qty 125

## 2018-12-27 NOTE — ED Notes (Signed)
Pt 98-100% on room air. Dr. Alcario Drought at bedside informed

## 2018-12-27 NOTE — ED Triage Notes (Signed)
Pt brought in by EMS with +diagnosis of Covid and  A-Fib with RVR.  Pt denies any heart condition.  BP is hypotensive at the moment.  PA in room to examine.  Lungs sounds coarse

## 2018-12-27 NOTE — ED Notes (Signed)
IV team at bedside to attempt 2nd IV

## 2018-12-27 NOTE — ED Provider Notes (Signed)
[facility] EMERGENCY DEPARTMENT Provider Note   CSN: [id number] Arrival date & time: 12/23/2018  1850     History   Chief Complaint Chief Complaint  Patient presents with  . Weakness  . Shortness of Breath  . Atrial Fibrillation   LEVEL 5 CAVEAT - AMS  HPI Stefanie [name] is a 73 y.o. female with PMHx CAD, CHF, CVA, diabetes, HTN, HLD, ESRD on dialysis who presents to the ED today with generalized weakness.  Was recently seen in the ED on 10/8 valuation of COVID-19 symptoms.  These included cough, chills, decrease in appetite, nausea, vomiting, diarrhea and feeling lethargic.  Tested positive for COVID during this ED visit.  Was discussion about whether or not to admit her but given patient was not hypoxic she did not meet inpatient criteria and she was discharged home to her nursing home.  Per EMS, she was at [facility] earlier today when she was noted to have a "low oxygen saturation."  They were not told what the oxygen saturation was.  Patient was sent home early from dialysis and was told that she needs oxygen at the nursing home.  She was told that if there was no oxygen at the nursing home that they would need to send her to the ED immediately.        Past Medical History:  Diagnosis Date  . Anemia   . CAD (coronary artery disease)   . CHF (congestive heart failure) ([place])   . Constipation   . CVA (cerebral vascular accident) ([employer]) [place]   left sided weakness  . Diabetes mellitus   . End stage renal disease ([place])    initiated HD 2006  . Hx of cardiovascular stress test    a. MV 03/2010:  no ischemia, EF 55%  . Hx of echocardiogram    a. Echo 03/2010: EF 55-60%, Gr 1 diast dysfn, trivial MS, mild to mod LAE, PASP 34, ;  b. Echo 11/13: mild LVH, EF 60%, Gr 1 diast dysfn, Ao sclerosis, no AS, MAC, slight MS, mean 3 mmHg, PASP 34  . Hyperlipidemia   . Hypertension   . Protein malnutrition ([place])   . Renal insufficiency   . Retinal detachment, bilateral    . Secondary hyperparathyroidism [facility])     Patient Active Problem List   Diagnosis Date Noted  . Atrial fibrillation with RVR ([place]) 01/09/2019  . COVID-19 virus infection   . Nausea & vomiting 07/30/2017  . Acute blood loss anemia 02/09/2016  . Hypotension due to blood loss 02/09/2016  . Chronic combined systolic and diastolic CHF (congestive heart failure) ([place]) 02/09/2016  . Hemorrhage 02/08/2016  . Anemia 02/08/2016  . Left leg pain 04/10/2015  . Bacteremia 04/10/2015  . Lactic acidosis   . Cellulitis 04/09/2015  . History of CVA with residual deficit 02/11/2015  . Weakness generalized 02/08/2015  . DVT (deep venous thrombosis) ([place])   . CHF (congestive heart failure) ([place])   . Chronic diastolic congestive heart failure ([place])   . ESRD (end stage renal disease) ([place])   . Congestive dilated cardiomyopathy ([place])   . Left leg DVT ([place]) 02/01/2015  . Leukocytosis 02/01/2015  . Elevated troponin 02/01/2015  . SOB (shortness of breath) 02/01/2015  . Type 2 diabetes, controlled, with neuropathy ([place]) 12/20/2014  . Pain due to onychomycosis of toenail 12/20/2014  . 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 Mechanical complication of other vascular device, implant, and graft 05/19/2013  . PAD (peripheral artery disease) (  [place]) 02/24/2012  . Murmur 12/24/2011  . Bradycardia-intermittent sinus 07/10/2010  . DYSPNEA ON EXERTION 02/25/2010  . CHEST PAIN, EXERTIONAL 02/25/2010  . DM 02/24/2010  . PROTEIN MALNUTRITION 02/24/2010  . HLD (hyperlipidemia) 02/24/2010  . ANEMIA 02/24/2010  . Essential hypertension 02/24/2010  . Cerebral artery occlusion with cerebral infarction ([place]) 02/24/2010  . ESRD on dialysis ([place]) 02/24/2010  . Secondary renal hyperparathyroidism ([place]) 02/24/2010    Past Surgical History:  Procedure Laterality Date  . ARTERIOVENOUS GRAFT PLACEMENT     BUE numerous times  . ARTERIOVENOUS GRAFT PLACEMENT  11/13/10   Lt femoral - Dr. Kellie Simmering  . BIOPSY  08/03/2017    Procedure: BIOPSY;  Surgeon: Clarene Essex, MD;  Location: [facility];  Service: Endoscopy;;  . BRONCHIAL BRUSHINGS  08/03/2017   Procedure: Esophageal  BRUSHINGS;  Surgeon: Clarene Essex, MD;  Location: [facility];  Service: Endoscopy;;  esophageal brushing  . CARDIAC CATHETERIZATION N/A 02/05/2015   Procedure: Left Heart Cath and Coronary Angiography;  Surgeon: Jettie Booze, MD;  Location: [facility] CV LAB;  Service: Cardiovascular;  Laterality: N/A;  . CATARACT EXTRACTION    . DG AV DIALYSIS GRAFT DECLOT OR  06/06/11, 08/26/11   performed in IR  . ESOPHAGOGASTRODUODENOSCOPY N/A 08/03/2017   Procedure: ESOPHAGOGASTRODUODENOSCOPY (EGD);  Surgeon: Clarene Essex, MD;  Location: [facility];  Service: Endoscopy;  Laterality: N/A;  . PERIPHERAL VASCULAR CATHETERIZATION N/A 02/06/2015   Procedure: A/V Shuntogram;  Surgeon: Elam Dutch, MD;  Location: [place] CV LAB;  Service: Cardiovascular;  Laterality: N/A;  . PERIPHERAL VASCULAR CATHETERIZATION Left 02/26/2016   Procedure: A/V Shuntogram;  Surgeon: Waynetta Sandy, MD;  Location: [facility] CV LAB;  Service: Cardiovascular;  Laterality: Left;  . PERIPHERAL VASCULAR CATHETERIZATION Left 02/26/2016   Procedure: Peripheral Vascular Balloon Angioplasty;  Surgeon: Waynetta Sandy, MD;  Location: [place] CV LAB;  Service: Cardiovascular;  Laterality: Left;  Lt thigh avf  . SHUNTOGRAM N/A 05/29/2013   Procedure: Earney Mallet;  Surgeon: Conrad Mitchell, MD;  Location: [facility] CATH LAB;  Service: Cardiovascular;  Laterality: N/A;  . SHUNTOGRAM Left 03/01/2014   Procedure: Earney Mallet;  Surgeon: Conrad Gilgo, MD;  Location: [facility] CATH LAB;  Service: Cardiovascular;  Laterality: Left;  . TUBAL LIGATION       OB History   No obstetric history on file.      Home Medications    Prior to Admission medications   Medication Sig Start Date End Date Taking? Authorizing Provider  acetaminophen (TYLENOL) 325 MG tablet Take 650 mg by  mouth 3 (three) times daily as needed (for pain- cannot exceed 3 grams/24 hours).    Yes [provider]  b complex-vitamin c-folic acid (NEPHRO-VITE) 0.8 MG TABS tablet Take 1 tablet by mouth every Monday, Wednesday, and Friday.    Yes [provider]  calcium acetate (PHOSLO) 667 MG capsule Take 667-2,001 mg by mouth See admin instructions. Take 2,001 mg by mouth three times a day with meals and 667 mg two times a day with snacks   Yes [provider]  cinacalcet (SENSIPAR) 30 MG tablet Take 1 tablet (30 mg total) by mouth Every Tuesday,Thursday,and Saturday with dialysis. 08/05/17  Yes Hosie Poisson, MD  Darbepoetin Alfa (ARANESP) 100 MCG/0.5ML SOSY injection Inject 0.5 mLs (100 mcg total) into the vein every Thursday with hemodialysis. 08/05/17  Yes Hosie Poisson, MD  diclofenac sodium (VOLTAREN) 1 % GEL Apply 2 g topically See admin instructions. Apply 2 grams to both knees three  times daily as needed for pain and to right hand 2-3 times a day as needed for pain (BRAND NAME VOLTAREN)   Yes [provider]  feeding supplement, GLUCERNA SHAKE, (GLUCERNA SHAKE) LIQD Take 237 mLs by mouth 2 (two) times daily as needed (weight loss).   Yes [provider]  glipiZIDE (GLUCOTROL) 10 MG tablet Take 10 mg by mouth 2 (two) times daily before a meal.    Yes [provider]  Glycerin-Hypromellose-PEG 400 (ARTIFICIAL TEARS) 0.2-0.2-1 % SOLN Place 1 drop into both eyes 4 (four) times daily as needed (dry eyes).   Yes [provider]  latanoprost (XALATAN) 0.005 % ophthalmic solution Place 1 drop into both eyes at bedtime.    Yes [provider]  LEVEMIR FLEXTOUCH 100 UNIT/ML Pen Inject 12 Units into the skin at bedtime.  09/15/17  Yes [provider]  lidocaine-prilocaine (EMLA) cream Apply 1 application topically as needed (topical anesthesia for hemodialysis if Gebauers and Lidocaine injection are ineffective.). 02/25/15  Yes Love,  Ivan Anchors, PA-C  loratadine (CLARITIN) 10 MG tablet Take 10 mg by mouth daily.   Yes [provider]  Melatonin 5 MG TABS Take 5 mg by mouth at bedtime.   Yes [provider]  neomycin-bacitracin-polymyxin (NEOSPORIN) ointment Apply 1 application topically 2 (two) times daily as needed for wound care.    Yes [provider]  pantoprazole (PROTONIX) 40 MG tablet Take 1 tablet (40 mg total) by mouth 2 (two) times daily. Patient taking differently: Take 40 mg by mouth daily before breakfast.  08/05/17  Yes Hosie Poisson, MD  Polyethylene Glycol 3350 (PEG 3350 PO) Take 17 g by mouth 3 (three) times daily as needed (for constipation).   Yes [provider]  pravastatin (PRAVACHOL) 20 MG tablet Take 20 mg by mouth at bedtime.   Yes [provider]  predniSONE (DELTASONE) 20 MG tablet 3 tabs po x 3 days, then 2 tabs daily x 3 days, then 1 tab x 3 days, then 1/2 tab x 3 days Patient taking differently: Take 40 mg by mouth daily. FOR 3 DAYS: Start date 12/29/2018  End date 12/29/2018 12/22/18  Yes Domenic Moras, PA-C  rOPINIRole (REQUIP) 0.25 MG tablet Take 0.25 mg by mouth daily as needed (restless legs).   Yes [provider]  rOPINIRole (REQUIP) 1 MG tablet Take 1 mg by mouth at bedtime.    Yes [provider]  sennosides-docusate sodium (SENOKOT-S) 8.6-50 MG tablet Take 2 tablets by mouth at bedtime as needed for constipation.    Yes [provider]  sorbitol 70 % solution Take 30 mLs by mouth daily as needed (for constipation).   Yes [provider]  traZODone (DESYREL) 50 MG tablet Take 25 mg by mouth at bedtime as needed for sleep.  07/06/17  Yes [provider]    Family History Family History  Problem Relation Age of Onset  . Heart disease Mother   . Diabetes Mother   . Hypertension Mother   . Heart attack Mother   . Diabetes Other   . Kidney disease Other   . Cardiomyopathy Other   . Diabetes Daughter   .  Diabetes Son   . Hypertension Son     Social History Social History   Tobacco Use  . Smoking status: Former Smoker    Quit date: 03/17/1983    Years since quitting: 35.8  . Smokeless tobacco: Never Used  Substance Use Topics  . Alcohol use: No  .  Drug use: No     Allergies   Patient has no known allergies.   Review of Systems Review of Systems  Unable to perform ROS: Mental status change     Physical Exam Updated Vital Signs BP 101/83   Pulse 81   Resp (!) 31   SpO2 100%   Physical Exam Vitals signs and nursing note reviewed.  Constitutional:      Appearance: She is ill-appearing and toxic-appearing.  HENT:     Head: Normocephalic and atraumatic.  Eyes:     Conjunctiva/sclera: Conjunctivae normal.  Neck:     Musculoskeletal: Neck supple.  Cardiovascular:     Rate and Rhythm: Tachycardia present. Rhythm irregular.     Comments: Pt in Afib with RVR, rate of 140s Pulmonary:     Effort: Tachypnea present.     Breath sounds: Rhonchi present.  Abdominal:     Palpations: Abdomen is soft.     Tenderness: There is no abdominal tenderness. There is no guarding or rebound.  Skin:    General: Skin is warm and dry.  Neurological:     Mental Status: She is alert.      ED Treatments / Results  Labs (all labs ordered are listed, but only abnormal results are displayed) Labs Reviewed  CBC WITH DIFFERENTIAL/PLATELET - Abnormal; Notable for the following components:      Result Value   WBC 13.4 (*)    RBC 3.70 (*)    Hemoglobin 11.7 (*)    MCV 102.4 (*)    Neutro Abs 12.2 (*)    Lymphs Abs 0.5 (*)    Abs Immature Granulocytes 0.35 (*)    All other components within normal limits  LACTIC ACID, PLASMA - Abnormal; Notable for the following components:   Lactic Acid, Venous 2.8 (*)    All other components within normal limits  LACTIC ACID, PLASMA - Abnormal; Notable for the following components:   Lactic Acid, Venous 2.3 (*)    All other components within  normal limits  LACTATE DEHYDROGENASE - Abnormal; Notable for the following components:   LDH 357 (*)    All other components within normal limits  TRIGLYCERIDES - Abnormal; Notable for the following components:   Triglycerides 393 (*)    All other components within normal limits  C-REACTIVE PROTEIN - Abnormal; Notable for the following components:   CRP 6.3 (*)    All other components within normal limits  D-DIMER, QUANTITATIVE (NOT AT [facility]) - Abnormal; Notable for the following components:   D-Dimer, Quant 3.21 (*)    All other components within normal limits  COMPREHENSIVE METABOLIC PANEL - Abnormal; Notable for the following components:   Sodium 130 (*)    Chloride 86 (*)    CO2 20 (*)    Glucose, Bld 456 (*)    BUN 39 (*)    Creatinine, Ser 6.84 (*)    Calcium 7.4 (*)    Total Protein 6.0 (*)    Albumin 2.6 (*)    Total Bilirubin 1.7 (*)    GFR calc non Af Amer 5 (*)    GFR calc Af Amer 6 (*)    Anion gap 24 (*)    All other components within normal limits  CBG MONITORING, ED - Abnormal; Notable for the following components:   Glucose-Capillary 260 (*)    All other components within normal limits  TROPONIN I (HIGH SENSITIVITY) - Abnormal; Notable for the following components:   Troponin I (High Sensitivity) 222 (*)  All other components within normal limits  TROPONIN I (HIGH SENSITIVITY) - Abnormal; Notable for the following components:   Troponin I (High Sensitivity) 318 (*)    All other components within normal limits  CULTURE, BLOOD (ROUTINE X 2)  CULTURE, BLOOD (ROUTINE X 2)  FIBRINOGEN  INFLUENZA PANEL BY PCR (TYPE A & B)  CBC WITH DIFFERENTIAL/PLATELET  COMPREHENSIVE METABOLIC PANEL  C-REACTIVE PROTEIN  D-DIMER, QUANTITATIVE (NOT AT [facility])  HEPARIN LEVEL (UNFRACTIONATED)  BRAIN NATRIURETIC PEPTIDE  HEMOGLOBIN A1C    EKG None  Radiology Dg Chest Port 1 View  Result Date: 12/19/2018 CLINICAL DATA:  73 year old female COVID-19. Atrial fibrillation with  RVR. EXAM: PORTABLE CHEST 1 VIEW COMPARISON:  12/22/2018 portable chest and earlier. FINDINGS: Portable AP semi upright view at 1932 hours. Pacer pads project over the left chest but there appears to be increasing streaky and confluent left perihilar and basilar opacity. Stable lung volumes. Stable cardiac size and mediastinal contours. Visualized tracheal air column is within normal limits. There is a chronic linear metal retained foreign body which is posterior to the medial right clavicle and stable from a CT in 2017. Allowing for portable technique the right lung remains clear. No pleural effusion Negative visible bowel gas pattern. Left axillary vascular stent. IMPRESSION: 1. Increasing left lung opacity suspicious for COVID-19 pneumonia. 2. No pleural effusion.  Right lung remains clear. Electronically Signed   By: Genevie Ann M.D.   On: 01/11/2019 20:08    Procedures .Critical Care Performed by: Eustaquio Maize, PA-C Authorized by: Eustaquio Maize, PA-C   Critical care provider statement:    Critical care time (minutes):  60   Critical care was necessary to treat or prevent imminent or life-threatening deterioration of the following conditions:  Sepsis and shock   Critical care was time spent personally by me on the following activities:  Discussions with consultants, evaluation of patient's response to treatment, examination of patient, ordering and performing treatments and interventions, ordering and review of laboratory studies, ordering and review of radiographic studies, pulse oximetry, re-evaluation of patient's condition, obtaining history from patient or surrogate and review of old charts   (including critical care time)  Medications Ordered in ED Medications  diltiazem (CARDIZEM) 125 mg in dextrose 5% 125 mL (1 mg/mL) infusion (10 mg/hr Intravenous Rate/Dose Change 01/08/2019 2241)  azithromycin (ZITHROMAX) 500 mg in sodium chloride 0.9 % 250 mL IVPB (500 mg Intravenous New Bag/Given  12/17/2018 2342)  predniSONE (DELTASONE) tablet 40 mg (has no administration in time range)  albuterol (VENTOLIN HFA) 108 (90 Base) MCG/ACT inhaler 2 puff (has no administration in time range)  acetaminophen (TYLENOL) tablet 650 mg (has no administration in time range)  ondansetron (ZOFRAN) tablet 4 mg (has no administration in time range)    Or  ondansetron (ZOFRAN) injection 4 mg (has no administration in time range)  azithromycin (ZITHROMAX) tablet 500 mg (has no administration in time range)  cefTRIAXone (ROCEPHIN) 1 g in sodium chloride 0.9 % 100 mL IVPB (has no administration in time range)  heparin bolus via infusion 3,000 Units (has no administration in time range)  heparin ADULT infusion 100 units/mL (25000 units/238m sodium chloride 0.45%) (has no administration in time range)  insulin detemir (LEVEMIR) injection 12 Units (has no administration in time range)  polyvinyl alcohol (LIQUIFILM TEARS) 1.4 % ophthalmic solution 1 drop (has no administration in time range)  rOPINIRole (REQUIP) tablet 1 mg (has no administration in time range)  pravastatin (PRAVACHOL) tablet 20 mg (has no administration in  time range)  Melatonin TABS 4.5 mg (has no administration in time range)  loratadine (CLARITIN) tablet 10 mg (has no administration in time range)  latanoprost (XALATAN) 0.005 % ophthalmic solution 1 drop (has no administration in time range)  cinacalcet (SENSIPAR) tablet 30 mg (has no administration in time range)  calcium acetate (PHOSLO) capsule 2,001 mg (has no administration in time range)  feeding supplement (GLUCERNA SHAKE) (GLUCERNA SHAKE) liquid 237 mL (has no administration in time range)  pantoprazole (PROTONIX) EC tablet 40 mg (has no administration in time range)  calcium acetate (PHOSLO) capsule 667 mg (has no administration in time range)  insulin aspart (novoLOG) injection 0-9 Units (has no administration in time range)  sodium chloride flush (NS) 0.9 % injection 3 mL (has  no administration in time range)  sodium chloride 0.9 % bolus 500 mL (0 mLs Intravenous Stopped 01/12/2019 2122)  cefTRIAXone (ROCEPHIN) 1 g in sodium chloride 0.9 % 100 mL IVPB (1 g Intravenous New Bag/Given 12/18/2018 2342)     Initial Impression / Assessment and Plan / ED Course  I have reviewed the triage vital signs and the nursing notes.  Pertinent labs & imaging results that were available during my care of the patient were reviewed by me and considered in my medical decision making (see chart for details).    73 year old female who presents with generalized weakness.  Recently was diagnosed with COVID on 10/8 after having GI-like symptoms. Sent here with concern for low oxygen saturation during dialysis today.  It appears very altered during my exam.  She repeatedly is asking for Jesus at this time.  She is in A. fib with RVR in the rates of 140s.  Also severely hypotensive.   received 500 cc bolus with EMS.  She does have a history of A. fib but does not appear to be on anticoagulation.  She is tachypneic.  She is rhonchorous throughout.  Surgeon saturation appears to be at 100% on room air.  Level 5 caveat initiated as patient appears very altered. She is a DNR.   Will obtain lab work today including preadmission order set for [place].  Start on Cardizem infusion today.  X-ray obtained as well.  Do not feel patient needs repeat COVID test at this time as we know she is already COVID positive.   Dr. Ralene Bathe attending physician has evaluated patient as well.  She was able to speak with the daughter and make her aware of patient status at this time.  Given patient is COVID positive family does not want to risk being exposed.  Comfrot care was not discussed but they are aware that patient is a DNR. Cardizem infusion initiated and will be titrated up.   Chest xray with concern for covid PNA. Leukocytosis present at 13,000 and lactic acid elevated at 2.8. Have started pt on IV abx to cover for pneumonia  at this time.   Pt continues to be in afib with RVR with 5 mg cardizem; increased to 7.5. Will consult critical care at this time given continued a fib and hypotension.   Discussed case with critical care - does not feel pt needs to be in the ICU. Will consult hospitalist at this time. Regardless she will need to stay at [facility] given she is an ESRD pt on dialysis; cannot go to [facility].   Discussed case with Dr. Alcario Drought who will admit pt. Unfortunately her BMP was cancelled without my knowledge and pt did not receive her 2nd fluid bolus and  IV abx in a timely manner. Blood pressures have stabilized and heart rate has decreased with 10 mg cardizem. Will hold on fluid bolus at the moment.   This note was prepared using Dragon voice recognition software and may include unintentional dictation errors due to the inherent limitations of voice recognition software.       Final Clinical Impressions(s) / ED Diagnoses   Final diagnoses:  COVID-19  Pneumonia due to COVID-19 virus  Atrial fibrillation with RVR (HCC)  Hypotension, unspecified hypotension type  Sepsis, due to unspecified organism, unspecified whether acute organ dysfunction present [facility])    ED Discharge Orders    None       Eustaquio Maize, PA-C 12/16/2018 0037    Quintella Reichert, MD 12/29/18 2240

## 2018-12-28 ENCOUNTER — Inpatient Hospital Stay (HOSPITAL_COMMUNITY): Payer: Medicare Other | Admitting: Anesthesiology

## 2018-12-28 ENCOUNTER — Inpatient Hospital Stay (HOSPITAL_COMMUNITY): Payer: Medicare Other

## 2018-12-28 ENCOUNTER — Encounter (HOSPITAL_COMMUNITY): Admission: EM | Disposition: E | Payer: Self-pay | Source: Skilled Nursing Facility | Attending: Pulmonary Disease

## 2018-12-28 DIAGNOSIS — J9601 Acute respiratory failure with hypoxia: Secondary | ICD-10-CM

## 2018-12-28 DIAGNOSIS — Z66 Do not resuscitate: Secondary | ICD-10-CM | POA: Diagnosis present

## 2018-12-28 DIAGNOSIS — I5042 Chronic combined systolic (congestive) and diastolic (congestive) heart failure: Secondary | ICD-10-CM | POA: Diagnosis present

## 2018-12-28 DIAGNOSIS — I998 Other disorder of circulatory system: Secondary | ICD-10-CM | POA: Diagnosis present

## 2018-12-28 DIAGNOSIS — I4891 Unspecified atrial fibrillation: Secondary | ICD-10-CM | POA: Diagnosis not present

## 2018-12-28 DIAGNOSIS — E1122 Type 2 diabetes mellitus with diabetic chronic kidney disease: Secondary | ICD-10-CM | POA: Diagnosis present

## 2018-12-28 DIAGNOSIS — J441 Chronic obstructive pulmonary disease with (acute) exacerbation: Secondary | ICD-10-CM | POA: Diagnosis not present

## 2018-12-28 DIAGNOSIS — E1165 Type 2 diabetes mellitus with hyperglycemia: Secondary | ICD-10-CM | POA: Diagnosis not present

## 2018-12-28 DIAGNOSIS — I469 Cardiac arrest, cause unspecified: Secondary | ICD-10-CM | POA: Diagnosis not present

## 2018-12-28 DIAGNOSIS — N186 End stage renal disease: Secondary | ICD-10-CM

## 2018-12-28 DIAGNOSIS — Z992 Dependence on renal dialysis: Secondary | ICD-10-CM | POA: Diagnosis not present

## 2018-12-28 DIAGNOSIS — I2510 Atherosclerotic heart disease of native coronary artery without angina pectoris: Secondary | ICD-10-CM | POA: Diagnosis present

## 2018-12-28 DIAGNOSIS — I9589 Other hypotension: Secondary | ICD-10-CM | POA: Diagnosis present

## 2018-12-28 DIAGNOSIS — N2581 Secondary hyperparathyroidism of renal origin: Secondary | ICD-10-CM | POA: Diagnosis present

## 2018-12-28 DIAGNOSIS — I132 Hypertensive heart and chronic kidney disease with heart failure and with stage 5 chronic kidney disease, or end stage renal disease: Secondary | ICD-10-CM | POA: Diagnosis present

## 2018-12-28 DIAGNOSIS — R531 Weakness: Secondary | ICD-10-CM | POA: Diagnosis present

## 2018-12-28 DIAGNOSIS — E1151 Type 2 diabetes mellitus with diabetic peripheral angiopathy without gangrene: Secondary | ICD-10-CM | POA: Diagnosis present

## 2018-12-28 DIAGNOSIS — I82492 Acute embolism and thrombosis of other specified deep vein of left lower extremity: Secondary | ICD-10-CM | POA: Diagnosis not present

## 2018-12-28 DIAGNOSIS — I472 Ventricular tachycardia: Secondary | ICD-10-CM | POA: Diagnosis not present

## 2018-12-28 DIAGNOSIS — I69354 Hemiplegia and hemiparesis following cerebral infarction affecting left non-dominant side: Secondary | ICD-10-CM | POA: Diagnosis not present

## 2018-12-28 DIAGNOSIS — E785 Hyperlipidemia, unspecified: Secondary | ICD-10-CM | POA: Diagnosis present

## 2018-12-28 DIAGNOSIS — D638 Anemia in other chronic diseases classified elsewhere: Secondary | ICD-10-CM | POA: Diagnosis present

## 2018-12-28 DIAGNOSIS — T17990A Other foreign object in respiratory tract, part unspecified in causing asphyxiation, initial encounter: Secondary | ICD-10-CM | POA: Diagnosis not present

## 2018-12-28 DIAGNOSIS — J440 Chronic obstructive pulmonary disease with acute lower respiratory infection: Secondary | ICD-10-CM | POA: Diagnosis present

## 2018-12-28 DIAGNOSIS — J1289 Other viral pneumonia: Secondary | ICD-10-CM | POA: Diagnosis present

## 2018-12-28 DIAGNOSIS — U071 COVID-19: Secondary | ICD-10-CM | POA: Diagnosis present

## 2018-12-28 DIAGNOSIS — Z993 Dependence on wheelchair: Secondary | ICD-10-CM | POA: Diagnosis not present

## 2018-12-28 DIAGNOSIS — E8889 Other specified metabolic disorders: Secondary | ICD-10-CM | POA: Diagnosis present

## 2018-12-28 DIAGNOSIS — I480 Paroxysmal atrial fibrillation: Secondary | ICD-10-CM | POA: Diagnosis present

## 2018-12-28 HISTORY — PX: EMBOLECTOMY: SHX44

## 2018-12-28 LAB — CBC WITH DIFFERENTIAL/PLATELET
Abs Immature Granulocytes: 0.34 10*3/uL — ABNORMAL HIGH (ref 0.00–0.07)
Basophils Absolute: 0 10*3/uL (ref 0.0–0.1)
Basophils Relative: 0 %
Eosinophils Absolute: 0 10*3/uL (ref 0.0–0.5)
Eosinophils Relative: 0 %
HCT: 31.5 % — ABNORMAL LOW (ref 36.0–46.0)
Hemoglobin: 10.2 g/dL — ABNORMAL LOW (ref 12.0–15.0)
Immature Granulocytes: 3 %
Lymphocytes Relative: 5 %
Lymphs Abs: 0.6 10*3/uL — ABNORMAL LOW (ref 0.7–4.0)
MCH: 31.6 pg (ref 26.0–34.0)
MCHC: 32.4 g/dL (ref 30.0–36.0)
MCV: 97.5 fL (ref 80.0–100.0)
Monocytes Absolute: 0.3 10*3/uL (ref 0.1–1.0)
Monocytes Relative: 3 %
Neutro Abs: 11.4 10*3/uL — ABNORMAL HIGH (ref 1.7–7.7)
Neutrophils Relative %: 89 %
Platelets: 162 10*3/uL (ref 150–400)
RBC: 3.23 MIL/uL — ABNORMAL LOW (ref 3.87–5.11)
RDW: 14.2 % (ref 11.5–15.5)
WBC: 12.7 10*3/uL — ABNORMAL HIGH (ref 4.0–10.5)
nRBC: 0 % (ref 0.0–0.2)

## 2018-12-28 LAB — CBG MONITORING, ED
Glucose-Capillary: 259 mg/dL — ABNORMAL HIGH (ref 70–99)
Glucose-Capillary: 260 mg/dL — ABNORMAL HIGH (ref 70–99)
Glucose-Capillary: 226 mg/dL — ABNORMAL HIGH (ref 70–99)
Glucose-Capillary: 301 mg/dL — ABNORMAL HIGH (ref 70–99)

## 2018-12-28 LAB — COMPREHENSIVE METABOLIC PANEL
ALT: 18 U/L (ref 0–44)
ALT: 15 U/L (ref 0–44)
AST: 25 U/L (ref 15–41)
AST: 23 U/L (ref 15–41)
Albumin: 2.6 g/dL — ABNORMAL LOW (ref 3.5–5.0)
Albumin: 2.7 g/dL — ABNORMAL LOW (ref 3.5–5.0)
Alkaline Phosphatase: 47 U/L (ref 38–126)
Alkaline Phosphatase: 47 U/L (ref 38–126)
Anion gap: 24 — ABNORMAL HIGH (ref 5–15)
Anion gap: 25 — ABNORMAL HIGH (ref 5–15)
BUN: 39 mg/dL — ABNORMAL HIGH (ref 8–23)
BUN: 45 mg/dL — ABNORMAL HIGH (ref 8–23)
CO2: 20 mmol/L — ABNORMAL LOW (ref 22–32)
CO2: 22 mmol/L (ref 22–32)
Calcium: 7.4 mg/dL — ABNORMAL LOW (ref 8.9–10.3)
Calcium: 7.8 mg/dL — ABNORMAL LOW (ref 8.9–10.3)
Chloride: 86 mmol/L — ABNORMAL LOW (ref 98–111)
Chloride: 89 mmol/L — ABNORMAL LOW (ref 98–111)
Creatinine, Ser: 6.84 mg/dL — ABNORMAL HIGH (ref 0.44–1.00)
Creatinine, Ser: 7.44 mg/dL — ABNORMAL HIGH (ref 0.44–1.00)
GFR calc Af Amer: 6 mL/min — ABNORMAL LOW (ref >60)
GFR calc Af Amer: 6 mL/min — ABNORMAL LOW (ref >60)
GFR calc non Af Amer: 5 mL/min — ABNORMAL LOW (ref >60)
GFR calc non Af Amer: 5 mL/min — ABNORMAL LOW (ref >60)
Glucose, Bld: 456 mg/dL — ABNORMAL HIGH (ref 70–99)
Glucose, Bld: 302 mg/dL — ABNORMAL HIGH (ref 70–99)
Potassium: 4.3 mmol/L (ref 3.5–5.1)
Potassium: 4.5 mmol/L (ref 3.5–5.1)
Sodium: 130 mmol/L — ABNORMAL LOW (ref 135–145)
Sodium: 136 mmol/L (ref 135–145)
Total Bilirubin: 1.7 mg/dL — ABNORMAL HIGH (ref 0.3–1.2)
Total Bilirubin: 1.3 mg/dL — ABNORMAL HIGH (ref 0.3–1.2)
Total Protein: 6 g/dL — ABNORMAL LOW (ref 6.5–8.1)
Total Protein: 6 g/dL — ABNORMAL LOW (ref 6.5–8.1)

## 2018-12-28 LAB — TROPONIN I (HIGH SENSITIVITY)
Troponin I (High Sensitivity): 318 ng/L (ref <18)
Troponin I (High Sensitivity): 755 ng/L (ref <18)
Troponin I (High Sensitivity): 764 ng/L (ref <18)
Troponin I (High Sensitivity): 834 ng/L (ref <18)
Troponin I (High Sensitivity): 682 ng/L (ref <18)

## 2018-12-28 LAB — TYPE AND SCREEN
ABO/RH(D): O POS
Antibody Screen: NEGATIVE

## 2018-12-28 LAB — INFLUENZA PANEL BY PCR (TYPE A & B)
Influenza A By PCR: NEGATIVE
Influenza B By PCR: NEGATIVE

## 2018-12-28 LAB — BRAIN NATRIURETIC PEPTIDE: B Natriuretic Peptide: 939.3 pg/mL — ABNORMAL HIGH (ref 0.0–100.0)

## 2018-12-28 LAB — HEPARIN LEVEL (UNFRACTIONATED): Heparin Unfractionated: 0.67 IU/mL (ref 0.30–0.70)

## 2018-12-28 LAB — D-DIMER, QUANTITATIVE: D-Dimer, Quant: 3.24 ug/mL-FEU — ABNORMAL HIGH (ref 0.00–0.50)

## 2018-12-28 LAB — HEMOGLOBIN A1C
Hgb A1c MFr Bld: 8.7 % — ABNORMAL HIGH (ref 4.8–5.6)
Mean Plasma Glucose: 202.99 mg/dL

## 2018-12-28 LAB — C-REACTIVE PROTEIN: CRP: 5.9 mg/dL — ABNORMAL HIGH (ref <1.0)

## 2018-12-28 SURGERY — EMBOLECTOMY
Anesthesia: General | Laterality: Left

## 2018-12-28 MED ORDER — MORPHINE SULFATE (PF) 2 MG/ML IV SOLN: 2.0000 mg | INTRAVENOUS | Status: DC | PRN

## 2018-12-28 MED ORDER — ROCURONIUM 10MG/ML (10ML) SYRINGE FOR MEDFUSION PUMP - OPTIME
INTRAVENOUS | Status: DC | PRN
  Administered 2018-12-28: 40 mg via INTRAVENOUS
  Administered 2018-12-28: 50 mg via INTRAVENOUS

## 2018-12-28 MED ORDER — EPHEDRINE SULFATE 50 MG/ML IJ SOLN
INTRAMUSCULAR | Status: DC | PRN
  Administered 2018-12-28 (×2): 15 mg via INTRAVENOUS

## 2018-12-28 MED ORDER — PHENYLEPHRINE HCL (PRESSORS) 10 MG/ML IV SOLN
INTRAVENOUS | Status: DC | PRN
  Administered 2018-12-28 (×2): 40 ug via INTRAVENOUS

## 2018-12-28 MED ORDER — SODIUM CHLORIDE 0.9 % IV SOLN
INTRAVENOUS | Status: DC | PRN
  Administered 2018-12-28: 500 mL

## 2018-12-28 MED ORDER — HEPARIN (PORCINE) 25000 UT/250ML-% IV SOLN
1100.0000 [IU]/h | INTRAVENOUS | Status: DC
  Administered 2018-12-28 (×2): 1100 [IU]/h via INTRAVENOUS
  Filled 2018-12-28 (×2): qty 250

## 2018-12-28 MED ORDER — GLUCERNA SHAKE PO LIQD
237.0000 mL | Freq: Two times a day (BID) | ORAL | Status: DC | PRN
  Filled 2018-12-28: qty 237

## 2018-12-28 MED ORDER — ORAL CARE MOUTH RINSE
15.0000 mL | OROMUCOSAL | Status: DC
  Administered 2018-12-29 – 2018-12-31 (×22): 15 mL via OROMUCOSAL

## 2018-12-28 MED ORDER — PROPOFOL 500 MG/50ML IV EMUL
INTRAVENOUS | Status: DC | PRN
  Administered 2018-12-28: 50 ug/kg/min via INTRAVENOUS

## 2018-12-28 MED ORDER — LATANOPROST 0.005 % OP SOLN
1.0000 [drp] | Freq: Every day | OPHTHALMIC | Status: DC
  Administered 2018-12-28 – 2018-12-30 (×2): 1 [drp] via OPHTHALMIC
  Filled 2018-12-28: qty 2.5

## 2018-12-28 MED ORDER — SODIUM CHLORIDE 0.9 % IV SOLN
1.0000 g | INTRAVENOUS | Status: DC
  Filled 2018-12-28: qty 10

## 2018-12-28 MED ORDER — FENTANYL CITRATE (PF) 100 MCG/2ML IJ SOLN
25.0000 ug | INTRAMUSCULAR | Status: DC | PRN
  Administered 2018-12-31: 11:00:00 50 ug via INTRAVENOUS
  Filled 2018-12-28: qty 2

## 2018-12-28 MED ORDER — METOPROLOL TARTRATE 25 MG PO TABS
25.0000 mg | ORAL_TABLET | Freq: Two times a day (BID) | ORAL | Status: DC
  Administered 2018-12-30 – 2018-12-31 (×2): 25 mg via ORAL
  Filled 2018-12-28 (×4): qty 1

## 2018-12-28 MED ORDER — PREDNISONE 20 MG PO TABS
40.0000 mg | ORAL_TABLET | Freq: Every day | ORAL | Status: DC
  Administered 2018-12-28 – 2018-12-31 (×3): 40 mg via ORAL
  Filled 2018-12-28 (×4): qty 2

## 2018-12-28 MED ORDER — INSULIN ASPART 100 UNIT/ML ~~LOC~~ SOLN: 0.0000 [IU] | Freq: Three times a day (TID) | SUBCUTANEOUS | Status: DC

## 2018-12-28 MED ORDER — INSULIN ASPART 100 UNIT/ML ~~LOC~~ SOLN
0.0000 [IU] | Freq: Three times a day (TID) | SUBCUTANEOUS | Status: DC
  Administered 2018-12-28: 18:00:00 7 [IU] via SUBCUTANEOUS
  Administered 2018-12-28: 14:00:00 3 [IU] via SUBCUTANEOUS
  Administered 2018-12-28: 09:00:00 5 [IU] via SUBCUTANEOUS

## 2018-12-28 MED ORDER — BISACODYL 10 MG RE SUPP: 10.0000 mg | Freq: Every day | RECTAL | Status: DC | PRN

## 2018-12-28 MED ORDER — SODIUM CHLORIDE 0.9 % IV SOLN: INTRAVENOUS | Status: DC | PRN

## 2018-12-28 MED ORDER — ONDANSETRON HCL 4 MG/2ML IJ SOLN: 4.0000 mg | Freq: Four times a day (QID) | INTRAMUSCULAR | Status: DC | PRN

## 2018-12-28 MED ORDER — DEXMEDETOMIDINE HCL IN NACL 400 MCG/100ML IV SOLN
0.0000 ug/kg/h | INTRAVENOUS | Status: DC
  Administered 2018-12-29: 04:00:00 0.4 ug/kg/h via INTRAVENOUS
  Administered 2018-12-30: 01:00:00 0.3 ug/kg/h via INTRAVENOUS
  Filled 2018-12-28 (×2): qty 100

## 2018-12-28 MED ORDER — INSULIN ASPART 100 UNIT/ML ~~LOC~~ SOLN: 0.0000 [IU] | Freq: Every day | SUBCUTANEOUS | Status: DC

## 2018-12-28 MED ORDER — ONDANSETRON HCL 4 MG PO TABS: 4.0000 mg | ORAL_TABLET | Freq: Four times a day (QID) | ORAL | Status: DC | PRN

## 2018-12-28 MED ORDER — SUCCINYLCHOLINE CHLORIDE 20 MG/ML IJ SOLN
INTRAMUSCULAR | Status: DC | PRN
  Administered 2018-12-28: 140 mg via INTRAVENOUS

## 2018-12-28 MED ORDER — CEFAZOLIN SODIUM-DEXTROSE 2-3 GM-%(50ML) IV SOLR
INTRAVENOUS | Status: DC | PRN
  Administered 2018-12-28: 2 g via INTRAVENOUS

## 2018-12-28 MED ORDER — SODIUM BICARBONATE 8.4 % IV SOLN
INTRAVENOUS | Status: DC | PRN
  Administered 2018-12-28: 25 meq via INTRAVENOUS

## 2018-12-28 MED ORDER — CALCIUM ACETATE (PHOS BINDER) 667 MG PO CAPS
2001.0000 mg | ORAL_CAPSULE | Freq: Three times a day (TID) | ORAL | Status: DC
  Administered 2018-12-28 (×2): 2001 mg via ORAL
  Filled 2018-12-28 (×5): qty 3

## 2018-12-28 MED ORDER — PANTOPRAZOLE SODIUM 40 MG IV SOLR
40.0000 mg | Freq: Every day | INTRAVENOUS | Status: DC
  Administered 2018-12-29 – 2018-12-31 (×3): 40 mg via INTRAVENOUS
  Filled 2018-12-28 (×3): qty 40

## 2018-12-28 MED ORDER — AZITHROMYCIN 250 MG PO TABS: 500.0000 mg | ORAL_TABLET | Freq: Every day | ORAL | Status: DC

## 2018-12-28 MED ORDER — FENTANYL CITRATE (PF) 250 MCG/5ML IJ SOLN
INTRAMUSCULAR | Status: DC | PRN
  Administered 2018-12-28: 100 ug via INTRAVENOUS
  Administered 2018-12-28: 50 ug via INTRAVENOUS
  Administered 2018-12-28: 100 ug via INTRAVENOUS

## 2018-12-28 MED ORDER — HEPARIN SODIUM (PORCINE) 1000 UNIT/ML IJ SOLN
INTRAMUSCULAR | Status: DC | PRN
  Administered 2018-12-28: 5000 [IU] via INTRAVENOUS

## 2018-12-28 MED ORDER — PANTOPRAZOLE SODIUM 40 MG PO TBEC
40.0000 mg | DELAYED_RELEASE_TABLET | Freq: Every day | ORAL | Status: DC
  Administered 2018-12-28: 09:00:00 40 mg via ORAL
  Filled 2018-12-28: qty 1

## 2018-12-28 MED ORDER — INSULIN ASPART 100 UNIT/ML ~~LOC~~ SOLN
0.0000 [IU] | SUBCUTANEOUS | Status: DC
  Administered 2018-12-29: 10:00:00 3 [IU] via SUBCUTANEOUS
  Administered 2018-12-29: 05:00:00 7 [IU] via SUBCUTANEOUS
  Administered 2018-12-29 (×3): 2 [IU] via SUBCUTANEOUS
  Administered 2018-12-29: 01:00:00 7 [IU] via SUBCUTANEOUS
  Administered 2018-12-30: 08:00:00 3 [IU] via SUBCUTANEOUS
  Administered 2018-12-30: 22:00:00 9 [IU] via SUBCUTANEOUS
  Administered 2018-12-30: 04:00:00 5 [IU] via SUBCUTANEOUS
  Administered 2018-12-30: 12:00:00 2 [IU] via SUBCUTANEOUS
  Administered 2018-12-30 (×2): 5 [IU] via SUBCUTANEOUS

## 2018-12-28 MED ORDER — ACETAMINOPHEN 325 MG PO TABS
650.0000 mg | ORAL_TABLET | Freq: Four times a day (QID) | ORAL | Status: DC | PRN
  Administered 2018-12-28: 09:00:00 650 mg via ORAL
  Filled 2018-12-28: qty 2

## 2018-12-28 MED ORDER — ROPINIROLE HCL 1 MG PO TABS
1.0000 mg | ORAL_TABLET | Freq: Every day | ORAL | Status: DC
  Administered 2018-12-28 – 2018-12-30 (×3): 1 mg via ORAL
  Filled 2018-12-28 (×5): qty 1

## 2018-12-28 MED ORDER — ALBUTEROL SULFATE HFA 108 (90 BASE) MCG/ACT IN AERS
2.0000 | INHALATION_SPRAY | Freq: Four times a day (QID) | RESPIRATORY_TRACT | Status: DC
  Administered 2018-12-28 (×3): 2 via RESPIRATORY_TRACT
  Filled 2018-12-28 (×2): qty 6.7

## 2018-12-28 MED ORDER — CALCIUM ACETATE (PHOS BINDER) 667 MG PO CAPS
667.0000 mg | ORAL_CAPSULE | Freq: Two times a day (BID) | ORAL | Status: DC | PRN
  Filled 2018-12-28: qty 1

## 2018-12-28 MED ORDER — POLYVINYL ALCOHOL 1.4 % OP SOLN
1.0000 [drp] | Freq: Four times a day (QID) | OPHTHALMIC | Status: DC | PRN
  Filled 2018-12-28: qty 15

## 2018-12-28 MED ORDER — LEVALBUTEROL HCL 0.63 MG/3ML IN NEBU: 0.6300 mg | INHALATION_SOLUTION | Freq: Four times a day (QID) | RESPIRATORY_TRACT | Status: DC | PRN

## 2018-12-28 MED ORDER — SODIUM CHLORIDE 0.9% FLUSH
3.0000 mL | Freq: Two times a day (BID) | INTRAVENOUS | Status: DC
  Administered 2018-12-28 – 2018-12-31 (×4): 3 mL via INTRAVENOUS

## 2018-12-28 MED ORDER — SODIUM CHLORIDE 0.9 % IV SOLN
INTRAVENOUS | Status: DC | PRN
  Administered 2018-12-28: 20:00:00 via INTRAVENOUS

## 2018-12-28 MED ORDER — CALCITRIOL 0.25 MCG PO CAPS
0.5000 ug | ORAL_CAPSULE | ORAL | Status: DC
  Filled 2018-12-28: qty 2
  Filled 2018-12-28: qty 1

## 2018-12-28 MED ORDER — CHLORHEXIDINE GLUCONATE CLOTH 2 % EX PADS
6.0000 | MEDICATED_PAD | Freq: Every day | CUTANEOUS | Status: DC
  Administered 2018-12-29: 10:00:00 6 via TOPICAL

## 2018-12-28 MED ORDER — LORATADINE 10 MG PO TABS
10.0000 mg | ORAL_TABLET | Freq: Every day | ORAL | Status: DC
  Administered 2018-12-28 – 2018-12-31 (×4): 10 mg via ORAL
  Filled 2018-12-28 (×4): qty 1

## 2018-12-28 MED ORDER — 0.9 % SODIUM CHLORIDE (POUR BTL) OPTIME
TOPICAL | Status: DC | PRN
  Administered 2018-12-28: 2000 mL

## 2018-12-28 MED ORDER — OXYCODONE-ACETAMINOPHEN 5-325 MG PO TABS: 1.0000 | ORAL_TABLET | ORAL | Status: DC | PRN

## 2018-12-28 MED ORDER — SODIUM CHLORIDE 0.9 % IV SOLN: 1.0000 g | INTRAVENOUS | Status: DC

## 2018-12-28 MED ORDER — PRAVASTATIN SODIUM 40 MG PO TABS
20.0000 mg | ORAL_TABLET | Freq: Every day | ORAL | Status: DC
  Administered 2018-12-29 – 2018-12-30 (×2): 20 mg via ORAL
  Filled 2018-12-28 (×3): qty 1

## 2018-12-28 MED ORDER — CHLORHEXIDINE GLUCONATE 0.12% ORAL RINSE (MEDLINE KIT)
15.0000 mL | Freq: Two times a day (BID) | OROMUCOSAL | Status: DC
  Administered 2018-12-29 – 2018-12-31 (×5): 15 mL via OROMUCOSAL

## 2018-12-28 MED ORDER — CINACALCET HCL 30 MG PO TABS: 30.0000 mg | ORAL_TABLET | ORAL | Status: DC

## 2018-12-28 MED ORDER — INSULIN DETEMIR 100 UNIT/ML ~~LOC~~ SOLN
12.0000 [IU] | Freq: Every day | SUBCUTANEOUS | Status: DC
  Administered 2018-12-28: 02:00:00 12 [IU] via SUBCUTANEOUS
  Filled 2018-12-28 (×3): qty 0.12

## 2018-12-28 MED ORDER — LIDOCAINE HCL (CARDIAC) PF 100 MG/5ML IV SOSY
PREFILLED_SYRINGE | INTRAVENOUS | Status: DC | PRN
  Administered 2018-12-28: 100 mg via INTRATRACHEAL

## 2018-12-28 MED ORDER — AMIODARONE HCL 200 MG PO TABS
200.0000 mg | ORAL_TABLET | Freq: Two times a day (BID) | ORAL | Status: DC
  Administered 2018-12-30 – 2018-12-31 (×2): 200 mg via ORAL
  Filled 2018-12-28 (×4): qty 1

## 2018-12-28 MED ORDER — FENTANYL CITRATE (PF) 250 MCG/5ML IJ SOLN
INTRAMUSCULAR | Status: AC
  Filled 2018-12-28: qty 5

## 2018-12-28 MED ORDER — STERILE WATER FOR IRRIGATION IR SOLN
Status: DC | PRN
  Administered 2018-12-28: 1000 mL

## 2018-12-28 MED ORDER — HEPARIN BOLUS VIA INFUSION
3000.0000 [IU] | Freq: Once | INTRAVENOUS | Status: AC
  Administered 2018-12-28: 02:00:00 3000 [IU] via INTRAVENOUS
  Filled 2018-12-28: qty 3000

## 2018-12-28 MED ORDER — PROPOFOL 10 MG/ML IV BOLUS
INTRAVENOUS | Status: DC | PRN
  Administered 2018-12-28: 70 mg via INTRAVENOUS

## 2018-12-28 MED ORDER — SODIUM CHLORIDE 0.9 % IV SOLN
INTRAVENOUS | Status: DC | PRN
  Administered 2018-12-28: 50 ug/min via INTRAVENOUS

## 2018-12-28 MED ORDER — DOXYCYCLINE HYCLATE 100 MG PO TABS
100.0000 mg | ORAL_TABLET | Freq: Two times a day (BID) | ORAL | Status: DC
  Administered 2018-12-29 – 2018-12-31 (×5): 100 mg via ORAL
  Filled 2018-12-28 (×5): qty 1

## 2018-12-28 MED ORDER — MELATONIN 3 MG PO TABS
4.5000 mg | ORAL_TABLET | Freq: Every day | ORAL | Status: DC
  Administered 2018-12-28 – 2018-12-30 (×3): 4.5 mg via ORAL
  Filled 2018-12-28 (×5): qty 1.5

## 2018-12-28 MED ORDER — FENTANYL CITRATE (PF) 100 MCG/2ML IJ SOLN: 25.0000 ug | INTRAMUSCULAR | Status: DC | PRN

## 2018-12-28 MED ORDER — DOCUSATE SODIUM 50 MG/5ML PO LIQD: 100.0000 mg | Freq: Two times a day (BID) | ORAL | Status: DC | PRN

## 2018-12-28 SURGICAL SUPPLY — 54 items
BANDAGE ESMARK 6X9 LF (GAUZE/BANDAGES/DRESSINGS) IMPLANT
BNDG ESMARK 6X9 LF (GAUZE/BANDAGES/DRESSINGS)
CANISTER SUCT 3000ML PPV (MISCELLANEOUS) ×3 IMPLANT
CATH EMB 3FR 80CM (CATHETERS) IMPLANT
CATH EMB 4FR 80CM (CATHETERS) ×6 IMPLANT
CATH EMB 5FR 80CM (CATHETERS) IMPLANT
CLIP LIGATING EXTRA MED SLVR (CLIP) ×3 IMPLANT
CLIP LIGATING EXTRA SM BLUE (MISCELLANEOUS) ×3 IMPLANT
COVER WAND RF STERILE (DRAPES) ×3 IMPLANT
CUFF TOURN SGL QUICK 34 (TOURNIQUET CUFF)
CUFF TOURN SGL QUICK 42 (TOURNIQUET CUFF) IMPLANT
CUFF TRNQT CYL 34X4.125X (TOURNIQUET CUFF) IMPLANT
DERMABOND ADVANCED (GAUZE/BANDAGES/DRESSINGS) ×2
DERMABOND ADVANCED .7 DNX12 (GAUZE/BANDAGES/DRESSINGS) ×1 IMPLANT
DRAIN SNY 10X20 3/4 PERF (WOUND CARE) IMPLANT
DRAPE X-RAY CASS 24X20 (DRAPES) IMPLANT
ELECT REM PT RETURN 9FT ADLT (ELECTROSURGICAL) ×3
ELECTRODE REM PT RTRN 9FT ADLT (ELECTROSURGICAL) ×1 IMPLANT
EVACUATOR SILICONE 100CC (DRAIN) IMPLANT
GLOVE BIO SURGEON STRL SZ7.5 (GLOVE) ×3 IMPLANT
GLOVE BIOGEL PI IND STRL 6.5 (GLOVE) ×1 IMPLANT
GLOVE BIOGEL PI IND STRL 7.0 (GLOVE) ×2 IMPLANT
GLOVE BIOGEL PI IND STRL 7.5 (GLOVE) ×1 IMPLANT
GLOVE BIOGEL PI INDICATOR 6.5 (GLOVE) ×2
GLOVE BIOGEL PI INDICATOR 7.0 (GLOVE) ×4
GLOVE BIOGEL PI INDICATOR 7.5 (GLOVE) ×2
GLOVE SS BIOGEL STRL SZ 7.5 (GLOVE) ×1 IMPLANT
GLOVE SUPERSENSE BIOGEL SZ 7.5 (GLOVE) ×2
GLOVE SURG SS PI 7.5 STRL IVOR (GLOVE) ×3 IMPLANT
GOWN STRL REUS W/ TWL LRG LVL3 (GOWN DISPOSABLE) ×3 IMPLANT
GOWN STRL REUS W/ TWL XL LVL3 (GOWN DISPOSABLE) ×1 IMPLANT
GOWN STRL REUS W/TWL LRG LVL3 (GOWN DISPOSABLE) ×6
GOWN STRL REUS W/TWL XL LVL3 (GOWN DISPOSABLE) ×2
KIT BASIN OR (CUSTOM PROCEDURE TRAY) ×3 IMPLANT
KIT TURNOVER KIT B (KITS) ×3 IMPLANT
NS IRRIG 1000ML POUR BTL (IV SOLUTION) ×6 IMPLANT
PACK PERIPHERAL VASCULAR (CUSTOM PROCEDURE TRAY) ×3 IMPLANT
PAD ARMBOARD 7.5X6 YLW CONV (MISCELLANEOUS) ×6 IMPLANT
PADDING CAST COTTON 6X4 STRL (CAST SUPPLIES) IMPLANT
SET COLLECT BLD 21X3/4 12 (NEEDLE) IMPLANT
STAPLER VISISTAT 35W (STAPLE) IMPLANT
STOPCOCK 4 WAY LG BORE MALE ST (IV SETS) IMPLANT
SUT ETHILON 3 0 PS 1 (SUTURE) IMPLANT
SUT PROLENE 5 0 C 1 24 (SUTURE) ×6 IMPLANT
SUT PROLENE 6 0 CC (SUTURE) ×9 IMPLANT
SUT VIC AB 2-0 CTX 36 (SUTURE) ×3 IMPLANT
SUT VIC AB 3-0 SH 27 (SUTURE) ×2
SUT VIC AB 3-0 SH 27X BRD (SUTURE) ×1 IMPLANT
SYR 3ML LL SCALE MARK (SYRINGE) ×3 IMPLANT
TOWEL GREEN STERILE (TOWEL DISPOSABLE) ×3 IMPLANT
TRAY FOLEY MTR SLVR 16FR STAT (SET/KITS/TRAYS/PACK) IMPLANT
TUBING EXTENTION W/L.L. (IV SETS) IMPLANT
UNDERPAD 30X30 (UNDERPADS AND DIAPERS) ×3 IMPLANT
WATER STERILE IRR 1000ML POUR (IV SOLUTION) ×3 IMPLANT

## 2018-12-28 NOTE — Progress Notes (Signed)
I was paged by the bedside nurse regarding left leg pain.  Nursing noted that the patient has been having left leg pain throughout the day and worsening. Bedside nurse noted the LLE to be cool to touch and had a negative doppler for pulses.   The patient is already on a heparin drip.   - Vascular Surgery Consulted.   Stefanie Panda, DO Pager: [phone]

## 2018-12-28 NOTE — H&P (Signed)
History and Physical    Stefanie S Masini KNL:[id number] DOB: [birth year] DOA: 12/19/2018  PCP: Bonnita Nasuti, MD  Patient coming from: SNF  I have personally briefly reviewed patient's old medical records in [place]  Chief Complaint: Weakness, SOB, a.fib  HPI: Stefanie [name] is a 73 y.o. 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  Patient diagnosed with COVID on Wed.  O2 sat not low, sent home with PO prednisone.  At dialysis today, patient noted to have "low oxygen saturation".  Not clear what she was satting.  Patient was sent home early from dialysis and was told that she needs oxygen at the nursing home.  She was told that if there was no oxygen at the nursing home that they would need to send her to the ED immediately.   ED Course: In the ER initially altered with hypotension BP 72/45, new onset A.Fib with RVR rate 140.  1L NS bolus and Cardizem gtt and bolus were ordered, though the NS bolus was never actually given.  Instead HR improved to 80s, BP improved to 118/101, and AMS resolved just with cardizem gtt.  Coarse breath sounds, though she is satting 98% on RA.  CXR shows worsening multifocal PNA c/w COVID.  Trop 222 and repeat 318  Lactate 2.8, repeat 2.3.  CMP still pending.   Review of Systems: As per HPI, otherwise all review of systems negative.  Past Medical History:  Diagnosis Date  . Anemia   . CAD (coronary artery disease)   . CHF (congestive heart failure) ([place])   . Constipation   . CVA (cerebral vascular accident) ([place]) [place]   left sided weakness  . Diabetes mellitus   . End stage renal disease ([place])    initiated HD 2006  . Hx of cardiovascular stress test    a. MV 03/2010:  no ischemia, EF 55%  . Hx of echocardiogram    a. Echo 03/2010: EF 55-60%, Gr 1 diast dysfn, trivial MS, mild to mod LAE, PASP 34, ;  b. Echo 11/13:  mild LVH, EF 60%, Gr 1 diast dysfn, Ao sclerosis, no AS, MAC, slight MS, mean 3 mmHg, PASP 34  . Hyperlipidemia   . Hypertension   . Protein malnutrition ([place])   . Renal insufficiency   . Retinal detachment, bilateral   . Secondary hyperparathyroidism ([place])     Past Surgical History:  Procedure Laterality Date  . ARTERIOVENOUS GRAFT PLACEMENT     BUE numerous times  . ARTERIOVENOUS GRAFT PLACEMENT  11/13/10   Lt femoral - Dr. Kellie Simmering  . BIOPSY  08/03/2017   Procedure: BIOPSY;  Surgeon: Clarene Essex, MD;  Location: [place];  Service: Endoscopy;;  . BRONCHIAL BRUSHINGS  08/03/2017   Procedure: Esophageal  BRUSHINGS;  Surgeon: Clarene Essex, MD;  Location: [place];  Service: Endoscopy;;  esophageal brushing  . CARDIAC CATHETERIZATION N/A 02/05/2015   Procedure: Left Heart Cath and Coronary Angiography;  Surgeon: Jettie Booze, MD;  Location: [place] [facility];  Service: Cardiovascular;  Laterality: N/A;  . CATARACT EXTRACTION    . DG AV DIALYSIS GRAFT DECLOT OR  06/06/11, 08/26/11   performed in IR  . ESOPHAGOGASTRODUODENOSCOPY N/A 08/03/2017   Procedure: ESOPHAGOGASTRODUODENOSCOPY (EGD);  Surgeon: Clarene Essex, MD;  Location: [place];  Service: Endoscopy;  Laterality: N/A;  . PERIPHERAL VASCULAR CATHETERIZATION N/A 02/06/2015   Procedure: A/V Shuntogram;  Surgeon: Elam Dutch, MD;  Location: [place] [facility];  Service: Cardiovascular;  Laterality: N/A;  . PERIPHERAL VASCULAR CATHETERIZATION Left 02/26/2016   Procedure: A/V Shuntogram;  Surgeon: Waynetta Sandy, MD;  Location: [place] [facility];  Service: Cardiovascular;  Laterality: Left;  . PERIPHERAL VASCULAR CATHETERIZATION Left 02/26/2016   Procedure: Peripheral Vascular Balloon Angioplasty;  Surgeon: Waynetta Sandy, MD;  Location: [place] [facility];  Service: Cardiovascular;  Laterality: Left;  Lt thigh avf  . SHUNTOGRAM N/A 05/29/2013   Procedure: Earney Mallet;  Surgeon: Conrad [place], MD;   Location: [facility] C[facility];  Service: Cardiovascular;  Laterality: N/A;  . SHUNTOGRAM Left 03/01/2014   Procedure: Earney Mallet;  Surgeon: Conrad Loa, MD;  Location: [facility] CATH LAB;  Service: Cardiovascular;  Laterality: Left;  . TUBAL LIGATION       reports that she quit smoking about 35 years ago. She has never used smokeless tobacco. She reports that she does not drink alcohol or use drugs.  No Known Allergies  Family History  Problem Relation Age of Onset  . Heart disease Mother   . Diabetes Mother   . Hypertension Mother   . Heart attack Mother   . Diabetes Other   . Kidney disease Other   . Cardiomyopathy Other   . Diabetes Daughter   . Diabetes Son   . Hypertension Son      Prior to Admission medications   Medication Sig Start Date End Date Taking? Authorizing Provider  acetaminophen (TYLENOL) 325 MG tablet Take 650 mg by mouth 3 (three) times daily as needed (for pain- cannot exceed 3 grams/24 hours).    Yes [provider]  b complex-vitamin c-folic acid (NEPHRO-VITE) 0.8 MG TABS tablet Take 1 tablet by mouth every Monday, Wednesday, and Friday.    Yes [provider]  calcium acetate (PHOSLO) 667 MG capsule Take 667-2,001 mg by mouth See admin instructions. Take 2,001 mg by mouth three times a day with meals and 667 mg two times a day with snacks   Yes [provider]  cinacalcet (SENSIPAR) 30 MG tablet Take 1 tablet (30 mg total) by mouth Every Tuesday,Thursday,and Saturday with dialysis. 08/05/17  Yes Hosie Poisson, MD  Darbepoetin Alfa (ARANESP) 100 MCG/0.5ML SOSY injection Inject 0.5 mLs (100 mcg total) into the vein every Thursday with hemodialysis. 08/05/17  Yes Hosie Poisson, MD  diclofenac sodium (VOLTAREN) 1 % GEL Apply 2 g topically See admin instructions. Apply 2 grams to both knees three times daily as needed for pain and to right hand 2-3 times a day as needed for pain (BRAND NAME VOLTAREN)   Yes [provider]  feeding supplement,  GLUCERNA SHAKE, (GLUCERNA SHAKE) LIQD Take 237 mLs by mouth 2 (two) times daily as needed (weight loss).   Yes [provider]  glipiZIDE (GLUCOTROL) 10 MG tablet Take 10 mg by mouth 2 (two) times daily before a meal.    Yes [provider]  Glycerin-Hypromellose-PEG 400 (ARTIFICIAL TEARS) 0.2-0.2-1 % SOLN Place 1 drop into both eyes 4 (four) times daily as needed (dry eyes).   Yes [provider]  latanoprost (XALATAN) 0.005 % ophthalmic solution Place 1 drop into both eyes at bedtime.    Yes [provider]  LEVEMIR FLEXTOUCH 100 UNIT/ML Pen Inject 12 Units into the skin at bedtime.  09/15/17  Yes [provider]  lidocaine-prilocaine (EMLA) cream Apply 1 application topically as needed (topical anesthesia for hemodialysis if Gebauers and Lidocaine injection are  ineffective.). 02/25/15  Yes Love, Ivan Anchors, PA-C  loratadine (CLARITIN) 10 MG tablet Take 10 mg by mouth daily.   Yes [provider]  Melatonin 5 MG TABS Take 5 mg by mouth at bedtime.   Yes [provider]  neomycin-bacitracin-polymyxin (NEOSPORIN) ointment Apply 1 application topically 2 (two) times daily as needed for wound care.    Yes [provider]  pantoprazole (PROTONIX) 40 MG tablet Take 1 tablet (40 mg total) by mouth 2 (two) times daily. Patient taking differently: Take 40 mg by mouth daily before breakfast.  08/05/17  Yes Hosie Poisson, MD  Polyethylene Glycol 3350 (PEG 3350 PO) Take 17 g by mouth 3 (three) times daily as needed (for constipation).   Yes [provider]  pravastatin (PRAVACHOL) 20 MG tablet Take 20 mg by mouth at bedtime.   Yes [provider]  predniSONE (DELTASONE) 20 MG tablet 3 tabs po x 3 days, then 2 tabs daily x 3 days, then 1 tab x 3 days, then 1/2 tab x 3 days Patient taking differently: Take 40 mg by mouth daily. FOR 3 DAYS: Start date 01/07/2019  End date 12/29/2018 12/22/18  Yes Domenic Moras, PA-C  rOPINIRole  (REQUIP) 0.25 MG tablet Take 0.25 mg by mouth daily as needed (restless legs).   Yes [provider]  rOPINIRole (REQUIP) 1 MG tablet Take 1 mg by mouth at bedtime.    Yes [provider]  sennosides-docusate sodium (SENOKOT-S) 8.6-50 MG tablet Take 2 tablets by mouth at bedtime as needed for constipation.    Yes [provider]  sorbitol 70 % solution Take 30 mLs by mouth daily as needed (for constipation).   Yes [provider]  traZODone (DESYREL) 50 MG tablet Take 25 mg by mouth at bedtime as needed for sleep.  07/06/17  Yes [provider]    Physical Exam: Vitals:   01/06/2019 1945 01/04/2019 2325 01/11/2019 2329 12/19/2018 2330  BP: 101/83 (!) 118/101    Pulse:  72  (!) 107  Resp: (!) _0 Temp:   98 F (36.7 C)   TempSrc:   Oral   SpO2:  99%  100%    Constitutional: NAD, calm, comfortable Eyes: PERRL, lids and conjunctivae normal ENMT: Mucous membranes are moist. Posterior pharynx clear of any exudate or lesions.Normal dentition.  Neck: normal, supple, no masses, no thyromegaly Respiratory: Diffuse rhonchi present Cardiovascular: IRR, IRR, no murmurs / rubs / gallops. No extremity edema. 2+ pedal pulses. No carotid bruits.  Abdomen: no tenderness, no masses palpated. No hepatosplenomegaly. Bowel sounds positive.  Musculoskeletal: no clubbing / cyanosis. No joint deformity upper and lower extremities. Good ROM, no contractures. Normal muscle tone.  Skin: no rashes, lesions, ulcers. No induration Neurologic: R Hemiparesis and generalized weakness, chronic.  Speech slightly slurred. Psychiatric: Answers questions, now waking up and starting to ask questions. "How long does this virus last", etc.   Labs on Admission: I have personally reviewed following labs and imaging studies  CBC: Recent Labs  Lab 12/22/18 1200 12/25/2018 2045  WBC 6.6 13.4*  NEUTROABS 5.1 12.2*  HGB 12.2 11.7*  HCT 37.8 37.9  MCV 101.1* 102.4*  PLT 97* 253    Basic Metabolic Panel: Recent Labs  Lab 12/22/18 1200  NA 139  K 4.0  CL 88*  CO2 31  GLUCOSE 140*  BUN 36*  CREATININE 7.85*  CALCIUM 8.1*   GFR: Estimated Creatinine Clearance: 6.4 mL/min (A) (by C-G formula based on  SCr of 7.85 mg/dL (H)). Liver Function Tests: Recent Labs  Lab 12/22/18 1200  AST 36  ALT 19  ALKPHOS 60  BILITOT 1.1  PROT 7.0  ALBUMIN 3.5   No results for input(s): LIPASE, AMYLASE in the last 168 hours. No results for input(s): AMMONIA in the last 168 hours. Coagulation Profile: No results for input(s): INR, PROTIME in the last 168 hours. Cardiac Enzymes: No results for input(s): CKTOTAL, CKMB, CKMBINDEX, TROPONINI in the last 168 hours. BNP (last 3 results) No results for input(s): PROBNP in the last 8760 hours. HbA1C: No results for input(s): HGBA1C in the last 72 hours. CBG: Recent Labs  Lab 12/22/18 1128 01/11/2019 1908  GLUCAP 139* 260*   Lipid Profile: Recent Labs    01/05/2019 2045  TRIG 393*   Thyroid Function Tests: No results for input(s): TSH, T4TOTAL, FREET4, T3FREE, THYROIDAB in the last 72 hours. Anemia Panel: No results for input(s): VITAMINB12, FOLATE, FERRITIN, TIBC, IRON, RETICCTPCT in the last 72 hours. Urine analysis: No results found for: COLORURINE, APPEARANCEUR, LABSPEC, PHURINE, GLUCOSEU, HGBUR, BILIRUBINUR, KETONESUR, PROTEINUR, UROBILINOGEN, NITRITE, LEUKOCYTESUR  Radiological Exams on Admission: Dg Chest Port 1 View  Result Date: 12/18/2018 CLINICAL DATA:  73 year old female COVID-19. Atrial fibrillation with RVR. EXAM: PORTABLE CHEST 1 VIEW COMPARISON:  12/22/2018 portable chest and earlier. FINDINGS: Portable AP semi upright view at 1932 hours. Pacer pads project over the left chest but there appears to be increasing streaky and confluent left perihilar and basilar opacity. Stable lung volumes. Stable cardiac size and mediastinal contours. Visualized tracheal air column is within normal limits. There is a chronic  linear metal retained foreign body which is posterior to the medial right clavicle and stable from a CT in 2017. Allowing for portable technique the right lung remains clear. No pleural effusion Negative visible bowel gas pattern. Left axillary vascular stent. IMPRESSION: 1. Increasing left lung opacity suspicious for COVID-19 pneumonia. 2. No pleural effusion.  Right lung remains clear. Electronically Signed   By: Genevie Ann M.D.   On: 01/08/2019 20:08    EKG: Independently reviewed.  Assessment/Plan Principal Problem:   Atrial fibrillation with RVR (HCC) Active Problems:   ESRD on dialysis ([place])   Chronic combined systolic and diastolic CHF (congestive heart failure) ([place])   COVID-19 virus infection    1. A.Fib RVR - 1. Rate controlled with cardizem gtt 2. Starting heparin gtt for HEART score of > 2 (age, h/o CVA and CHF, etc). 3. Tele monitor 2. COVID-19 - 1. Coarse lung sounds make me worry that she either has superimposed bacterial infection or CHF decompensation (latter seems more likely due to the HR 140s) 1. Though actually, seems that the rhonchi were present on evaluation last week as well. 2. BCx pending 3. Continue rocephin / azithro for now 4. Continue daily PO prednisone for COVID 5. O2 sat 98% on RA, remdesivir not indicated 6. BNP ordered and pending 7. Trend trop - though think we are looking at demand ischemia if anything with the mild elevations 8. Tele monitor 3. ESRD on dialysis - 1. CMP pending 2. Call nephrology in AM for evaluation, wondering how much fluid they got off yesterday and wether she needs further dialysis later today. 4. Chronic combined CHF - 1. BNP pending 2. Trend trops 3. Tele monitor  DVT prophylaxis: Heparin per pham Code Status: DNR Family Communication: No family in room Disposition Plan: SNF after admit Consults called: None Admission status: Admit to inpatient  Severity of Illness: The appropriate  patient status for this patient  is INPATIENT. Inpatient status is judged to be reasonable and necessary in order to provide the required intensity of service to ensure the patient's safety. The patient's presenting symptoms, physical exam findings, and initial radiographic and laboratory data in the context of their chronic comorbidities is felt to place them at high risk for further clinical deterioration. Furthermore, it is not anticipated that the patient will be medically stable for discharge from the hospital within 2 midnights of admission. The following factors support the patient status of inpatient.   IP status for A.Fib RVR with hypotension, initial AMS.  Also COVID-19 PNA.  * I certify that at the point of admission it is my clinical judgment that the patient will require inpatient hospital care spanning beyond 2 midnights from the point of admission due to high intensity of service, high risk for further deterioration and high frequency of surveillance required.*    ,  M. DO [facility]  How to contact the [facility] Attending or Consulting provider [place] or covering provider during after hours [place], for this patient?  1. Check the care team in [facility] and look for a) attending/consulting TR[facility] provider listed and b) the [facility] team listed 2. Log into [email or web address]  Amion Physician Scheduling and messaging for groups and whole hospitals  On call and physician scheduling software for group practices, residents, hospitalists and other medical providers for call, clinic, rotation and shift schedules. On[employer] is a hospital-wide system for scheduling doctors and paging doctors on call. EasyPlot is for scientific plotting and data analysis.  [email or web address]  and use [place]'s universal password to access. If you do not have the password, please contact the hospital operator.  3. Locate the [facility] provider you are looking for under [facility] and page to a number that you can be directly reached. 4.  If you still have difficulty reaching the provider, please page the [facility] (Director on Call) for the Hospitalists listed on amion for assistance.  12/21/2018, 12:07 AM

## 2018-12-28 NOTE — Progress Notes (Signed)
ANTICOAGULATION CONSULT NOTE  Pharmacy Consult for Heparin Indication: atrial fibrillation  No Known Allergies  Patient Measurements:   Heparin Dosing Weight: 70 kg  Vital Signs: Temp: 99 F (37.2 C) (10/14 0619) Temp Source: Oral (10/14 0619) BP: 143/58 (10/14 0800) Pulse Rate: 75 (10/14 0833)  Labs: Recent Labs    12/23/2018 2045 01/03/2019 2314 01/13/2019 0234 01/09/2019 0502 12/30/2018 0857  HGB 11.7*  --  10.2*  --   --   HCT 37.9  --  31.5*  --   --   PLT 156  --  162  --   --   HEPARINUNFRC  --   --   --   --  0.67  CREATININE  --  6.84* 7.44*  --   --   TROPONINIHS 222* 318* 755* 764*  --     Estimated Creatinine Clearance: 6.8 mL/min (A) (by C-G formula based on SCr of 7.44 mg/dL (H)).  Assessment: 73 y.o. female continues on IV heparin for new afib. Initial heparin level is therapeutic. CBC is stable and no bleeding noted.   Goal of Therapy:  Heparin level 0.3-0.7 units/ml Monitor platelets by anticoagulation protocol: Yes   Plan:  Continue heparin gtt 1100 units/hr Daily heparin level and CBC  Dajia Gunnels, Rande Lawman 12/28/2018,10:02 AM

## 2018-12-28 NOTE — Progress Notes (Signed)
Patient received from OR bedside report. Diltiazem infusing at 10mg . HR 60 med d/ced. Patient on propofol at 40mg  pt GCS 3 med D/ced no order at this time.

## 2018-12-28 NOTE — ED Notes (Signed)
Anna (SR)-Lunch Tray Ordered @1011. 

## 2018-12-28 NOTE — ED Notes (Signed)
This RN informed by tech Lavella Lemons that patient reports numbness in left lower leg and unable to move extremity. This RN assessed patient. Left lower extremity cold to the touch below the knee. Unable to palpate or doppler left pedal pulses. Primary RN made aware by this RN. Primary RN paging MD.

## 2018-12-28 NOTE — Progress Notes (Addendum)
  Transported patient from OR via mechanical ventilation to 3M04.  No complications. 7.5 ETT secured 24 cm @lip .ETAB placed.

## 2018-12-28 NOTE — ED Notes (Signed)
Dinner tray ordered.

## 2018-12-28 NOTE — Anesthesia Preprocedure Evaluation (Signed)
Anesthesia Evaluation  Patient identified by MRN, date of birth, ID band Patient awake    Reviewed: Allergy & Precautions, NPO status , Patient's Chart, lab work & pertinent test results  Airway Mallampati: II  TM Distance: >3 FB Neck ROM: Full    Dental   Pulmonary former smoker,    breath sounds clear to auscultation       Cardiovascular hypertension, Pt. on medications + CAD, + Peripheral Vascular Disease, +CHF and + DVT   Rhythm:Regular Rate:Normal  Echo 2019 - Left ventricle: The cavity size was mildly dilated. Systolic   function was moderately to severely reduced. The estimated ejection fraction was in the range of 30% to 35%. Diffuse hypokinesis. Doppler parameters are consistent with a reversible restrictive pattern, indicative of decreased left ventricular diastolic compliance and/or increased left atrial pressure (grade 3 diastolic dysfunction). - Aortic valve: Trileaflet; mildly thickened, mildly calcified   leaflets. There was trivial regurgitation. - Aorta: The aorta was mildly calcified. - Mitral valve: Severely calcified annulus. - Left atrium: The atrium was mildly dilated. Volume/bsa, S: 42.4 ml/m^2. - Right ventricle: The cavity size was mildly dilated. Wall   thickness was normal. - Tricuspid valve: There was trivial regurgitation. - Pulmonary arteries: Systolic pressure was mildly to moderately increased. PA peak pressure: 47 mm Hg (S).   '16 Cath - 1st Diag lesion, 100% stenosed. Mid RCA lesion, 100% stenosed. faint left to right collaterals are present. There is severe left ventricular systolic dysfunction, which is out of proportion to the degree of coronary artery disease. Component of nonischemic cardiomyopathy present  '16 TTE - LV cavity size was mildly dilated, mild LVH. EF of 30% to 35%. Diffuse hypokinesis. Grade 2 diastolic dysfunction. Trivial AI. Mild MR. Left atrium was severely dilated. RV cavity  size was mildly dilated. RV systolic   function was moderately reduced. Right atrium was mildly dilated.  PA peak pressure: 55 mm Hg    Neuro/Psych CVA, Residual Symptoms negative psych ROS   GI/Hepatic negative GI ROS, Neg liver ROS,   Endo/Other  diabetes  Renal/GU ESRF and DialysisRenal disease  negative genitourinary   Musculoskeletal negative musculoskeletal ROS (+)   Abdominal   Peds  Hematology  (+) anemia , Thrombocytopenia   Anesthesia Other Findings   Reproductive/Obstetrics                             Anesthesia Physical  Anesthesia Plan  ASA: V and emergent  Anesthesia Plan: General   Post-op Pain Management:    Induction: Intravenous and Rapid sequence  PONV Risk Score and Plan: 3 and Treatment may vary due to age or medical condition, Ondansetron and Dexamethasone  Airway Management Planned: Oral ETT  Additional Equipment: Arterial line  Intra-op Plan:   Post-operative Plan: Possible Post-op intubation/ventilation  Informed Consent: I have reviewed the patients History and Physical, chart, labs and discussed the procedure including the risks, benefits and alternatives for the proposed anesthesia with the patient or authorized representative who has indicated his/her understanding and acceptance.     Dental advisory given  Plan Discussed with: CRNA  Anesthesia Plan Comments:         Anesthesia Quick Evaluation

## 2018-12-28 NOTE — Anesthesia Procedure Notes (Signed)
Procedure Name: Intubation Date/Time: 12/29/2018 7:48 PM Performed by: Valetta Fuller, CRNA Pre-anesthesia Checklist: Patient identified, Emergency Drugs available, Suction available and Patient being monitored Patient Re-evaluated:Patient Re-evaluated prior to induction Oxygen Delivery Method: Circle system utilized Preoxygenation: Pre-oxygenation with 100% oxygen Induction Type: IV induction and Rapid sequence Laryngoscope Size: Miller and 2 Grade View: Grade I Tube type: Oral Tube size: 7.5 mm Number of attempts: 1 Airway Equipment and Method: Stylet Placement Confirmation: ETT inserted through vocal cords under direct vision,  positive ETCO2 and breath sounds checked- equal and bilateral Secured at: 24 cm Tube secured with: Tape Dental Injury: Teeth and Oropharynx as per pre-operative assessment

## 2018-12-28 NOTE — Progress Notes (Signed)
ANTICOAGULATION CONSULT NOTE - Initial Consult  Pharmacy Consult for Heparin Indication: atrial fibrillation  No Known Allergies  Patient Measurements:   Heparin Dosing Weight: 70 kg  Vital Signs: Temp: 98 F (36.7 C) (10/13 2329) Temp Source: Oral (10/13 2329) BP: 118/101 (10/13 2325) Pulse Rate: 107 (10/13 2330)  Labs: Recent Labs    01/02/2019 2045  HGB 11.7*  HCT 37.9  PLT 156  TROPONINIHS 222*    Estimated Creatinine Clearance: 6.4 mL/min (A) (by C-G formula based on SCr of 7.85 mg/dL (H)).   Medical History: Past Medical History:  Diagnosis Date  . Anemia   . CAD (coronary artery disease)   . CHF (congestive heart failure) ([place])   . Constipation   . CVA (cerebral vascular accident) ([place]) [place]   left sided weakness  . Diabetes mellitus   . End stage renal disease ([place])    initiated HD 2006  . Hx of cardiovascular stress test    a. MV 03/2010:  no ischemia, EF 55%  . Hx of echocardiogram    a. Echo 03/2010: EF 55-60%, Gr 1 diast dysfn, trivial MS, mild to mod LAE, PASP 34, ;  b. Echo 11/13: mild LVH, EF 60%, Gr 1 diast dysfn, Ao sclerosis, no AS, MAC, slight MS, mean 3 mmHg, PASP 34  . Hyperlipidemia   . Hypertension   . Protein malnutrition ([place])   . Renal insufficiency   . Retinal detachment, bilateral   . Secondary hyperparathyroidism ([place])     Medications:  No current facility-administered medications on file prior to encounter.    Current Outpatient Medications on File Prior to Encounter  Medication Sig Dispense Refill  . acetaminophen (TYLENOL) 325 MG tablet Take 650 mg by mouth 3 (three) times daily as needed (for pain- cannot exceed 3 grams/24 hours).     Marland Kitchen b complex-vitamin c-folic acid (NEPHRO-VITE) 0.8 MG TABS tablet Take 1 tablet by mouth every Monday, Wednesday, and Friday.     . calcium acetate (PHOSLO) 667 MG capsule Take 667-2,001 mg by mouth See admin instructions. Take 2,001 mg by mouth three times a day with meals and 667 mg two  times a day with snacks    . cinacalcet (SENSIPAR) 30 MG tablet Take 1 tablet (30 mg total) by mouth Every Tuesday,Thursday,and Saturday with dialysis. 60 tablet   . Darbepoetin Alfa (ARANESP) 100 MCG/0.5ML SOSY injection Inject 0.5 mLs (100 mcg total) into the vein every Thursday with hemodialysis. 4.2 mL   . diclofenac sodium (VOLTAREN) 1 % GEL Apply 2 g topically See admin instructions. Apply 2 grams to both knees three times daily as needed for pain and to right hand 2-3 times a day as needed for pain (BRAND NAME VOLTAREN)    . feeding supplement, GLUCERNA SHAKE, (GLUCERNA SHAKE) LIQD Take 237 mLs by mouth 2 (two) times daily as needed (weight loss).    Marland Kitchen glipiZIDE (GLUCOTROL) 10 MG tablet Take 10 mg by mouth 2 (two) times daily before a meal.     . Glycerin-Hypromellose-PEG 400 (ARTIFICIAL TEARS) 0.2-0.2-1 % SOLN Place 1 drop into both eyes 4 (four) times daily as needed (dry eyes).    Marland Kitchen latanoprost (XALATAN) 0.005 % ophthalmic solution Place 1 drop into both eyes at bedtime.     Marland Kitchen LEVEMIR FLEXTOUCH 100 UNIT/ML Pen Inject 12 Units into the skin at bedtime.     . lidocaine-prilocaine (EMLA) cream Apply 1 application topically as needed (topical anesthesia for hemodialysis if Gebauers and Lidocaine injection are ineffective.).  30 g 0  . loratadine (CLARITIN) 10 MG tablet Take 10 mg by mouth daily.    . Melatonin 5 MG TABS Take 5 mg by mouth at bedtime.    Marland Kitchen neomycin-bacitracin-polymyxin (NEOSPORIN) ointment Apply 1 application topically 2 (two) times daily as needed for wound care.     . pantoprazole (PROTONIX) 40 MG tablet Take 1 tablet (40 mg total) by mouth 2 (two) times daily. (Patient taking differently: Take 40 mg by mouth daily before breakfast. ) 60 tablet 2  . Polyethylene Glycol 3350 (PEG 3350 PO) Take 17 g by mouth 3 (three) times daily as needed (for constipation).    . pravastatin (PRAVACHOL) 20 MG tablet Take 20 mg by mouth at bedtime.    . predniSONE (DELTASONE) 20 MG tablet 3 tabs  po x 3 days, then 2 tabs daily x 3 days, then 1 tab x 3 days, then 1/2 tab x 3 days (Patient taking differently: Take 40 mg by mouth daily. FOR 3 DAYS: Start date 12/15/2018  End date 12/29/2018) 20 tablet 0  . rOPINIRole (REQUIP) 0.25 MG tablet Take 0.25 mg by mouth daily as needed (restless legs).    Marland Kitchen rOPINIRole (REQUIP) 1 MG tablet Take 1 mg by mouth at bedtime.     . sennosides-docusate sodium (SENOKOT-S) 8.6-50 MG tablet Take 2 tablets by mouth at bedtime as needed for constipation.     . sorbitol 70 % solution Take 30 mLs by mouth daily as needed (for constipation).    . traZODone (DESYREL) 50 MG tablet Take 25 mg by mouth at bedtime as needed for sleep.     . polyethylene glycol (MIRALAX) packet Use 17 gms three times daily until bowels move, with maximum of 3 consecutive days (Patient not taking: Reported on 01/10/2019) 10 each 0     Assessment: 73 y.o. female with new onset Afib for heparin  Goal of Therapy:  Heparin level 0.3-0.7 units/ml Monitor platelets by anticoagulation protocol: Yes   Plan:  Heparin 3000 units IV bolus, then start heparin 1100 units/hr Check heparin level in 8 hours.   Caryl Pina 01/06/2019,12:01 AM

## 2018-12-28 NOTE — ED Notes (Signed)
RN to hold 500 mL bolus. BP WDL. Per Dr. Alcario Drought. RN to give bolus if pt become hypotensive

## 2018-12-28 NOTE — Op Note (Signed)
    OPERATIVE REPORT  DATE OF SURGERY: 12/27/2018  PATIENT: Stefanie [name], 73 y.o. female MRN: [id number]  DOB: [birth year]  PRE-OPERATIVE DIAGNOSIS: Critical limb ischemia left leg  POST-OPERATIVE DIAGNOSIS:  Same  PROCEDURE: Left femoral and left iliac thrombectomy  SURGEON:  Curt Jews, M.D.  PHYSICIAN ASSISTANT: Matt Eaglin, PA-C  ANESTHESIA: General  EBL: per anesthesia record  Total I/O In: 300 [I.V.:300] Out: 100 [Blood:100]  BLOOD ADMINISTERED: none  DRAINS: none  SPECIMEN: none  COUNTS CORRECT:  YES  PATIENT DISPOSITION: Medical intensive care unit- hemodynamically stable  PROCEDURE DETAILS: Patient was taken the operating room placed supine position where the area of the left groin left leg prepped draped you sterile fashion.  The patient had known Covid infection and precautions were taken.  Incision was made over the common femoral scar from her prior left femoral loop graft.  She had no femoral pulse.  The common femoral artery was exposed at the inguinal ligament and the Gore-Tex graft was encircled as well.  There was no pulse in the femoral artery.  The patient had heparin drip running and was given an additional 5000 units of intravenous heparin.  The artery was occluded proximally and distally in the common femoral artery was opened transversely with an 11 blade.  A 4 Fogarty catheter was passed into the level of the aorta and was removed with occlusive thrombus being present in the iliac vessels.  This restored excellent pulse.  1 set negative past was obtained the femoral artery was reoccluded at the level of the inguinal ligament.  Fogarty catheter was then passed distally and no thrombus was removed.  There was excellent backbleeding.  The catheter would not pass the abductor canal.  Patient had documented ankle index 1.6 from 2017 with presumed SFA occlusion.  The incision in the common femoral artery was closed with a running 5-0 Prolene suture.  Clamps  were removed and excellent pulse was noted.  There is also good flow in the Gore-Tex graft.  The patient's heparin was not reversed.  The wound was closed with 2-0 Vicryl in several layers in the subcutaneous tissue and the skin was closed with 3-0 subcuticular Vicryl stitch.  The patient was transferred to the medical intensive care unit intubated.   Rosetta Posner, M.D., [facility] 12/17/2018 9:48 PM

## 2018-12-28 NOTE — Transfer of Care (Signed)
Immediate Anesthesia Transfer of Care Note  Patient: Stefanie [name]  Procedure(s) Performed: LEFT FEMORAL AND ILIAC ARTERY THROMBECTOMY (Left )  Patient Location: ICU  Anesthesia Type:General  Level of Consciousness: Patient remains intubated per anesthesia plan  Airway & Oxygen Therapy: Patient placed on Ventilator (see vital sign flow sheet for setting)  Post-op Assessment: Report given to RN and Post -op Vital signs reviewed and stable  Post vital signs: Reviewed and stable  Last Vitals:  Vitals Value Taken Time  BP 113/50 12/17/2018 2210  Temp 36.4 C 01/13/2019 2210  Pulse 58 01/08/2019 2231  Resp 18 01/02/2019 2231  SpO2 96 % 01/13/2019 2231  Vitals shown include unvalidated device data.  Last Pain:  Vitals:   12/22/2018 1012  TempSrc:   PainSc: Asleep         Complications: No apparent anesthesia complications

## 2018-12-28 NOTE — Progress Notes (Signed)
ICU RN paged NP because pt was in the ICU postop thrombectomy and on the ventilator and RN has no orders. NP called Elink who does not know the pt. PCCM will come see the pt and assume care. KJKG, NP [facility]

## 2018-12-28 NOTE — Consult Note (Signed)
[facility] [place]  Patient name: Stefanie [name] MRN: [id number] DOB: 08/08/1945 Sex: female    HPI: Stefanie S Doring is a 73 y.o. female seen in the emergency department with profound ischemia of her left leg.  She has a very complex history.  She was diagnosed with Covid 7 to 10 days ago.  She was doing fairly well but presented today with increased difficulty with shortness of breath and was found to be in rapid ventricular response A. fib.  She does have a long history of end-stage renal disease and dialyzes via a left femoral AV Gore-Tex graft.  Noninvasive studies in the past in 2017 revealed monophasic flow in her left foot and biphasic flow in her right foot.  She was evaluated by cardiology and was treated for rapid ventricular response and had resolution of this.  Later in the evening it was discovered that she had a cold numb left foot.  The patient reports this has been progressive during the day but was not present when she presented this morning.  She currently has no motor or sensory function in her left foot  Past Medical History:  Diagnosis Date  . Anemia   . CAD (coronary artery disease)   . CHF (congestive heart failure) ([place])   . Constipation   . CVA (cerebral vascular accident) ([place]) [place]   left sided weakness  . Diabetes mellitus   . End stage renal disease ([place])    initiated HD 2006  . Hx of cardiovascular stress test    a. MV 03/2010:  no ischemia, EF 55%  . Hx of echocardiogram    a. Echo 03/2010: EF 55-60%, Gr 1 diast dysfn, trivial MS, mild to mod LAE, PASP 34, ;  b. Echo 11/13: mild LVH, EF 60%, Gr 1 diast dysfn, Ao sclerosis, no AS, MAC, slight MS, mean 3 mmHg, PASP 34  . Hyperlipidemia   . Hypertension   . Protein malnutrition ([place])   . Renal insufficiency   . Retinal detachment, bilateral   . Secondary hyperparathyroidism ([place])     Family History  Problem Relation Age of Onset  . Heart  disease Mother   . Diabetes Mother   . Hypertension Mother   . Heart attack Mother   . Diabetes Other   . Kidney disease Other   . Cardiomyopathy Other   . Diabetes Daughter   . Diabetes Son   . Hypertension Son     SOCIAL HISTORY: Social History   Tobacco Use  . Smoking status: Former Smoker    Quit date: 03/17/1983    Years since quitting: 35.8  . Smokeless tobacco: Never Used  Substance Use Topics  . Alcohol use: No    No Known Allergies  Current Facility-Administered Medications  Medication Dose Route Frequency Provider Last Rate Last Dose  . acetaminophen (TYLENOL) tablet 650 mg  650 mg Oral Q6H PRN Etta Quill, DO   650 mg at 12/30/2018 0847  . albuterol (VENTOLIN HFA) 108 (90 Base) MCG/ACT inhaler 2 puff  2 puff Inhalation Q6H Etta Quill, DO   2 puff at 01/11/2019 1408  . amiodarone (PACERONE) tablet 200 mg  200 mg Oral BID Ledora Bottcher, PA      . calcium acetate (PHOSLO) capsule 2,001 mg  2,001 mg Oral TID WC Jennette Kettle M, DO   2,001 mg at 12/24/2018 1332  . calcium acetate (PHOSLO) capsule 667 mg  667 mg Oral BID BM  PRN Etta Quill, DO      . cefTRIAXone (ROCEPHIN) 1 g in sodium chloride 0.9 % 100 mL IVPB  1 g Intravenous Q24H Jennette Kettle M, DO      . [START ON 12/29/2018] cinacalcet (SENSIPAR) tablet 30 mg  30 mg Oral Q T,Th,Sa-HD Etta Quill, DO      . diltiazem (CARDIZEM) 125 mg in dextrose 5% 125 mL (1 mg/mL) infusion  5-15 mg/hr Intravenous Titrated Alroy Bailiff, [place], PA-C 5 mL/hr at 01/10/2019 1016 5 mg/hr at 01/11/2019 1016  . doxycycline (VIBRA-TABS) tablet 100 mg  100 mg Oral Q12H Harold Hedge, MD      . feeding supplement (GLUCERNA SHAKE) (GLUCERNA SHAKE) liquid 237 mL  237 mL Oral BID PRN Etta Quill, DO      . heparin ADULT infusion 100 units/mL (25000 units/223m sodium chloride 0.45%)  1,100 Units/hr Intravenous Continuous GEtta Quill DO 11 mL/hr at 01/10/2019 0135 1,100 Units/hr at 01/05/2019 0135  . insulin aspart (novoLOG)  injection 0-9 Units  0-9 Units Subcutaneous TID WC GEtta Quill DO   7 Units at 12/29/2018 1739  . insulin detemir (LEVEMIR) injection 12 Units  12 Units Subcutaneous QHS GEtta Quill DO   12 Units at 01/09/2019 0143  . latanoprost (XALATAN) 0.005 % ophthalmic solution 1 drop  1 drop Both Eyes QHS GJennette KettleM, DO   1 drop at 12/30/2018 0155  . loratadine (CLARITIN) tablet 10 mg  10 mg Oral Daily GJennette KettleM, DO   10 mg at 01/14/2019 0847  . Melatonin TABS 4.5 mg  4.5 mg Oral QHS GJennette KettleM, DO   4.5 mg at 12/18/2018 0153  . metoprolol tartrate (LOPRESSOR) tablet 25 mg  25 mg Oral BID DLedora Bottcher PA      . ondansetron ([facility] tablet 4 mg  4 mg Oral Q6H PRN GEtta Quill DO       Or  . ondansetron ([facility] injection 4 mg  4 mg Intravenous Q6H PRN GEtta Quill DO      . pantoprazole (PROTONIX) EC tablet 40 mg  40 mg Oral QAC breakfast GJennette KettleM, DO   40 mg at 01/09/2019 0847  . polyvinyl alcohol (LIQUIFILM TEARS) 1.4 % ophthalmic solution 1 drop  1 drop Both Eyes QID PRN GEtta Quill DO      . pravastatin (PRAVACHOL) tablet 20 mg  20 mg Oral QHS GJennette KettleM, DO      . predniSONE (DELTASONE) tablet 40 mg  40 mg Oral Q breakfast GJennette KettleM, DO   40 mg at 12/21/2018 0847  . rOPINIRole (REQUIP) tablet 1 mg  1 mg Oral QHS GJennette KettleM, DO   1 mg at 12/29/2018 0154  . sodium chloride flush (NS) 0.9 % injection 3 mL  3 mL Intravenous Q12H GSherwood Gambler MD   3 mL at 01/03/2019 0138   Current Outpatient Medications  Medication Sig Dispense Refill  . acetaminophen (TYLENOL) 325 MG tablet Take 650 mg by mouth 3 (three) times daily as needed (for pain- cannot exceed 3 grams/24 hours).     .Marland Kitchenb complex-vitamin c-folic acid (NEPHRO-VITE) 0.8 MG TABS tablet Take 1 tablet by mouth every Monday, Wednesday, and Friday.     . calcium acetate (PHOSLO) 667 MG capsule Take 667-2,001 mg by mouth See admin instructions. Take 2,001 mg by mouth three times a day with  meals and 667 mg two times a day with snacks    .  cinacalcet (SENSIPAR) 30 MG tablet Take 1 tablet (30 mg total) by mouth Every Tuesday,Thursday,and Saturday with dialysis. 60 tablet   . Darbepoetin Alfa (ARANESP) 100 MCG/0.5ML SOSY injection Inject 0.5 mLs (100 mcg total) into the vein every Thursday with hemodialysis. 4.2 mL   . diclofenac sodium (VOLTAREN) 1 % GEL Apply 2 g topically See admin instructions. Apply 2 grams to both knees three times daily as needed for pain and to right hand 2-3 times a day as needed for pain (BRAND NAME VOLTAREN)    . feeding supplement, GLUCERNA SHAKE, (GLUCERNA SHAKE) LIQD Take 237 mLs by mouth 2 (two) times daily as needed (weight loss).    Marland Kitchen glipiZIDE (GLUCOTROL) 10 MG tablet Take 10 mg by mouth 2 (two) times daily before a meal.     . Glycerin-Hypromellose-PEG 400 (ARTIFICIAL TEARS) 0.2-0.2-1 % SOLN Place 1 drop into both eyes 4 (four) times daily as needed (dry eyes).    Marland Kitchen latanoprost (XALATAN) 0.005 % ophthalmic solution Place 1 drop into both eyes at bedtime.     Marland Kitchen LEVEMIR FLEXTOUCH 100 UNIT/ML Pen Inject 12 Units into the skin at bedtime.     . lidocaine-prilocaine (EMLA) cream Apply 1 application topically as needed (topical anesthesia for hemodialysis if Gebauers and Lidocaine injection are ineffective.). 30 g 0  . loratadine (CLARITIN) 10 MG tablet Take 10 mg by mouth daily.    . Melatonin 5 MG TABS Take 5 mg by mouth at bedtime.    Marland Kitchen neomycin-bacitracin-polymyxin (NEOSPORIN) ointment Apply 1 application topically 2 (two) times daily as needed for wound care.     . pantoprazole (PROTONIX) 40 MG tablet Take 1 tablet (40 mg total) by mouth 2 (two) times daily. (Patient taking differently: Take 40 mg by mouth daily before breakfast. ) 60 tablet 2  . Polyethylene Glycol 3350 (PEG 3350 PO) Take 17 g by mouth 3 (three) times daily as needed (for constipation).    . pravastatin (PRAVACHOL) 20 MG tablet Take 20 mg by mouth at bedtime.    . predniSONE (DELTASONE)  20 MG tablet 3 tabs po x 3 days, then 2 tabs daily x 3 days, then 1 tab x 3 days, then 1/2 tab x 3 days (Patient taking differently: Take 40 mg by mouth daily. FOR 3 DAYS: Start date 12/17/2018  End date 12/29/2018) 20 tablet 0  . rOPINIRole (REQUIP) 0.25 MG tablet Take 0.25 mg by mouth daily as needed (restless legs).    Marland Kitchen rOPINIRole (REQUIP) 1 MG tablet Take 1 mg by mouth at bedtime.     . sennosides-docusate sodium (SENOKOT-S) 8.6-50 MG tablet Take 2 tablets by mouth at bedtime as needed for constipation.     . sorbitol 70 % solution Take 30 mLs by mouth daily as needed (for constipation).    . traZODone (DESYREL) 50 MG tablet Take 25 mg by mouth at bedtime as needed for sleep.       REVIEW OF SYSTEMS:  _0  denotes positive finding, _1  denotes negative finding Cardiac  Comments:  Chest pain or chest pressure:    Shortness of breath upon exertion: x   Short of breath when lying flat:    Irregular heart rhythm: x       Vascular    Pain in calf, thigh, or hip brought on by ambulation:    Pain in feet at night that wakes you up from your sleep:     Blood clot in your veins:    Leg swelling:  PHYSICAL EXAM: Vitals:   12/24/2018 1000 12/20/2018 1200 01/08/2019 1337 01/07/2019 1400  BP: (!) 128/56 (!) 126/53 (!) 126/53 (!) 108/94  Pulse: 68 67 72 70  Resp: (!) 28 (!) 28 (!) 34 (!) 24  Temp:      TempSrc:      SpO2: 90% 95% 94% 99%    GENERAL: The patient is a well-nourished female, in moderate distress with shortness of breath and coughing.. The vital signs are documented above. CARDIOVASCULAR: She has no flow in her left femoral loop graft.  She has no left femoral pulse.  She does have an easily palpable right femoral pulse.  I do not palpate pedal pulses bilaterally.  She does have Doppler flow in her right dorsalis pedis and no Doppler flow on the left. PULMONARY: There is good air exchange  MUSCULOSKELETAL: There are no major deformities or cyanosis. NEUROLOGIC: She cannot  feel her left foot and has no motor function in her left foot SKIN: There are no ulcers or rashes noted. PSYCHIATRIC: The patient has a normal affect.  DATA:  None  MEDICAL ISSUES: Acute profound ischemia left foot most likely related to arterial embolus.  I will take to the operating room emergently for thrombectomy and limb salvage.  Discussed this at length with the patient who understands    Rosetta Posner, MD [facility] Tel [phone] Pager [phone]

## 2018-12-28 NOTE — ED Notes (Signed)
Dinner Tray Ordered @ 1715.  

## 2018-12-28 NOTE — Consult Note (Addendum)
Cardiology Consultation:   Patient ID: Stefanie S Lorence MRN: [id number]; DOB: 03-Aug-1945  Admit date: 01/08/2019 Date of Consult: 01/04/2019  Primary Care Provider: Bonnita Nasuti, MD Primary Cardiologist: Mertie Moores, MD  Primary Electrophysiologist:  Virl Axe, MD    Patient Profile:   Stefanie [name] is a 73 y.o. female with a hx of ESRD on HD (TTS), DM2, chronic systolic and diastolic heart failure, CAD s/p LHC 2016 with 100% occlusion of first diagonal and 100% stenosis of the mid RCA with faint left to right collaterals, and atrial fibrillation who is being seen today for the evaluation of Afib with RVR at the request of Dr. Neysa Bonito.  History of Present Illness:   Ms. Burress was initially diagnosed with Afib during an ER visit in 2019.  Echo with moderately reduced EF of 30-35%. He was last seen in Afib clinic. Pt was not anticoagulated given her HD status and gait instability making her a high fall risk. Follow up holter monitor did not show any evidence of Afib.  She resides at a SNF and has chronic left sided weakness. She was diagnosed with COVD-19 on 12/21/18. She presented to HD on 01/08/2019 with hypoxia. She was sent home early and told she needed O2 at SNF. She ultimately was sent to the ED instead and found to be in Afib RVR with hypotension. Cardizem drip was started with improvement in her mentation and vital signs. CXR with worsening PNA consistent with COVID-19.    Cardiology was consulted for management of her Afib. EKG with atrial fibrillation with ventricular rate 140. Cardizem drip running at 5 mg/hr.   Telemetry shows patient converted to NSR at approximately 2330 last evening.     Heart Pathway Score:     Past Medical History:  Diagnosis Date   Anemia    CAD (coronary artery disease)    CHF (congestive heart failure) (HCC)    Constipation    CVA (cerebral vascular accident) ([place]) 1985, [place]   left sided weakness   Diabetes mellitus    End stage  renal disease ([place])    initiated HD 2006   Hx of cardiovascular stress test    a. MV 03/2010:  no ischemia, EF 55%   Hx of echocardiogram    a. Echo 03/2010: EF 55-60%, Gr 1 diast dysfn, trivial MS, mild to mod LAE, PASP 34, ;  b. Echo 11/13: mild LVH, EF 60%, Gr 1 diast dysfn, Ao sclerosis, no AS, MAC, slight MS, mean 3 mmHg, PASP 34   Hyperlipidemia    Hypertension    Protein malnutrition (HCC)    Renal insufficiency    Retinal detachment, bilateral    Secondary hyperparathyroidism ([place])     Past Surgical History:  Procedure Laterality Date   ARTERIOVENOUS GRAFT PLACEMENT     BUE numerous times   ARTERIOVENOUS GRAFT PLACEMENT  11/13/10   Lt femoral - Dr. Kellie Simmering   BIOPSY  08/03/2017   Procedure: BIOPSY;  Surgeon: Clarene Essex, MD;  Location: [facility] ENDOSCOPY;  Service: Endoscopy;;   BRONCHIAL BRUSHINGS  08/03/2017   Procedure: Esophageal  BRUSHINGS;  Surgeon: Clarene Essex, MD;  Location: [facility] ENDOSCOPY;  Service: Endoscopy;;  esophageal brushing   CARDIAC CATHETERIZATION N/A 02/05/2015   Procedure: Left Heart Cath and Coronary Angiography;  Surgeon: Jettie Booze, MD;  Location: [place] CV LAB;  Service: Cardiovascular;  Laterality: N/A;   CATARACT EXTRACTION     DG AV DIALYSIS GRAFT DECLOT OR  06/06/11, 08/26/11   performed  in IR   ESOPHAGOGASTRODUODENOSCOPY N/A 08/03/2017   Procedure: ESOPHAGOGASTRODUODENOSCOPY (EGD);  Surgeon: Clarene Essex, MD;  Location: [place];  Service: Endoscopy;  Laterality: N/A;   PERIPHERAL VASCULAR CATHETERIZATION N/A 02/06/2015   Procedure: A/V Shuntogram;  Surgeon: Elam Dutch, MD;  Location: [place] CV LAB;  Service: Cardiovascular;  Laterality: N/A;   PERIPHERAL VASCULAR CATHETERIZATION Left 02/26/2016   Procedure: A/V Shuntogram;  Surgeon: Waynetta Sandy, MD;  Location: [place] CV LAB;  Service: Cardiovascular;  Laterality: Left;   PERIPHERAL VASCULAR CATHETERIZATION Left 02/26/2016   Procedure: Peripheral  Vascular Balloon Angioplasty;  Surgeon: Waynetta Sandy, MD;  Location: [place] CV LAB;  Service: Cardiovascular;  Laterality: Left;  Lt thigh avf   SHUNTOGRAM N/A 05/29/2013   Procedure: Earney Mallet;  Surgeon: Conrad Olivia Lopez de Gutierrez, MD;  Location: [facility] CATH LAB;  Service: Cardiovascular;  Laterality: N/A;   SHUNTOGRAM Left 03/01/2014   Procedure: Earney Mallet;  Surgeon: Conrad , MD;  Location: [facility] CATH LAB;  Service: Cardiovascular;  Laterality: Left;   TUBAL LIGATION       Home Medications:  Prior to Admission medications   Medication Sig Start Date End Date Taking? Authorizing Provider  acetaminophen (TYLENOL) 325 MG tablet Take 650 mg by mouth 3 (three) times daily as needed (for pain- cannot exceed 3 grams/24 hours).    Yes [provider]  b complex-vitamin c-folic acid (NEPHRO-VITE) 0.8 MG TABS tablet Take 1 tablet by mouth every Monday, Wednesday, and Friday.    Yes [provider]  calcium acetate (PHOSLO) 667 MG capsule Take 667-2,001 mg by mouth See admin instructions. Take 2,001 mg by mouth three times a day with meals and 667 mg two times a day with snacks   Yes [provider]  cinacalcet (SENSIPAR) 30 MG tablet Take 1 tablet (30 mg total) by mouth Every Tuesday,Thursday,and Saturday with dialysis. 08/05/17  Yes Hosie Poisson, MD  Darbepoetin Alfa (ARANESP) 100 MCG/0.5ML SOSY injection Inject 0.5 mLs (100 mcg total) into the vein every Thursday with hemodialysis. 08/05/17  Yes Hosie Poisson, MD  diclofenac sodium (VOLTAREN) 1 % GEL Apply 2 g topically See admin instructions. Apply 2 grams to both knees three times daily as needed for pain and to right hand 2-3 times a day as needed for pain (BRAND NAME VOLTAREN)   Yes [provider]  feeding supplement, GLUCERNA SHAKE, (GLUCERNA SHAKE) LIQD Take 237 mLs by mouth 2 (two) times daily as needed (weight loss).   Yes [provider]  glipiZIDE (GLUCOTROL) 10 MG tablet Take 10 mg by mouth 2  (two) times daily before a meal.    Yes [provider]  Glycerin-Hypromellose-PEG 400 (ARTIFICIAL TEARS) 0.2-0.2-1 % SOLN Place 1 drop into both eyes 4 (four) times daily as needed (dry eyes).   Yes [provider]  latanoprost (XALATAN) 0.005 % ophthalmic solution Place 1 drop into both eyes at bedtime.    Yes [provider]  LEVEMIR FLEXTOUCH 100 UNIT/ML Pen Inject 12 Units into the skin at bedtime.  09/15/17  Yes [provider]  lidocaine-prilocaine (EMLA) cream Apply 1 application topically as needed (topical anesthesia for hemodialysis if Gebauers and Lidocaine injection are ineffective.). 02/25/15  Yes Love, Ivan Anchors, PA-C  loratadine (CLARITIN) 10 MG tablet Take 10 mg by mouth daily.   Yes [provider]  Melatonin 5 MG TABS Take 5 mg by mouth at bedtime.   Yes [provider]  neomycin-bacitracin-polymyxin (NEOSPORIN) ointment Apply 1 application topically 2 (  two) times daily as needed for wound care.    Yes [provider]  pantoprazole (PROTONIX) 40 MG tablet Take 1 tablet (40 mg total) by mouth 2 (two) times daily. Patient taking differently: Take 40 mg by mouth daily before breakfast.  08/05/17  Yes Hosie Poisson, MD  Polyethylene Glycol 3350 (PEG 3350 PO) Take 17 g by mouth 3 (three) times daily as needed (for constipation).   Yes [provider]  pravastatin (PRAVACHOL) 20 MG tablet Take 20 mg by mouth at bedtime.   Yes [provider]  predniSONE (DELTASONE) 20 MG tablet 3 tabs po x 3 days, then 2 tabs daily x 3 days, then 1 tab x 3 days, then 1/2 tab x 3 days Patient taking differently: Take 40 mg by mouth daily. FOR 3 DAYS: Start date 12/19/2018  End date 12/29/2018 12/22/18  Yes Domenic Moras, PA-C  rOPINIRole (REQUIP) 0.25 MG tablet Take 0.25 mg by mouth daily as needed (restless legs).   Yes [provider]  rOPINIRole (REQUIP) 1 MG tablet Take 1 mg by mouth at bedtime.    Yes [provider]  sennosides-docusate sodium (SENOKOT-S) 8.6-50 MG tablet Take 2 tablets by mouth at bedtime as needed for constipation.    Yes [provider]  sorbitol 70 % solution Take 30 mLs by mouth daily as needed (for constipation).   Yes [provider]  traZODone (DESYREL) 50 MG tablet Take 25 mg by mouth at bedtime as needed for sleep.  07/06/17  Yes [provider]    Inpatient Medications: Scheduled Meds:  albuterol  2 puff Inhalation Q6H   amiodarone  200 mg Oral BID   azithromycin  500 mg Oral QPC supper   calcium acetate  2,001 mg Oral TID WC   [START ON 12/29/2018] cinacalcet  30 mg Oral Q T,Th,Sa-HD   insulin aspart  0-9 Units Subcutaneous TID WC   insulin detemir  12 Units Subcutaneous QHS   latanoprost  1 drop Both Eyes QHS   loratadine  10 mg Oral Daily   Melatonin  4.5 mg Oral QHS   metoprolol tartrate  25 mg Oral BID   pantoprazole  40 mg Oral QAC breakfast   pravastatin  20 mg Oral QHS   predniSONE  40 mg Oral Q breakfast   rOPINIRole  1 mg Oral QHS   sodium chloride flush  3 mL Intravenous Q12H   Continuous Infusions:  cefTRIAXone (ROCEPHIN)  IV     diltiazem (CARDIZEM) infusion 5 mg/hr (01/12/2019 1016)   heparin 1,100 Units/hr (12/25/2018 0135)   PRN Meds: acetaminophen, calcium acetate, feeding supplement (GLUCERNA SHAKE), ondansetron **OR** ondansetron (ZOFRAN) IV, polyvinyl alcohol  Allergies:   No Known Allergies  Social History:   Social History   Socioeconomic History   Marital status: Divorced    Spouse name: Not on file   Number of children: 5   Years of education: Not on file   Highest education level: Not on file  Occupational History    Employer: RETIRED  Social Needs   Financial resource strain: Not on file   Food insecurity    Worry: Not on file    Inability: Not on file   Transportation needs    Medical: Not on file    Non-medical: Not on file  Tobacco Use   Smoking status:  Former Smoker    Quit date: 03/17/1983    Years since quitting: 35.8   Smokeless tobacco: Never Used  Substance and  Sexual Activity   Alcohol use: No   Drug use: No   Sexual activity: Not on file  Lifestyle   Physical activity    Days per week: Not on file    Minutes per session: Not on file   Stress: Not on file  Relationships   Social connections    Talks on phone: Not on file    Gets together: Not on file    Attends religious service: Not on file    Active member of club or organization: Not on file    Attends meetings of clubs or organizations: Not on file    Relationship status: Not on file   Intimate partner violence    Fear of current or ex partner: Not on file    Emotionally abused: Not on file    Physically abused: Not on file    Forced sexual activity: Not on file  Other Topics Concern   Not on file  Social History Narrative   Not on file    Family History:    Family History  Problem Relation Age of Onset   Heart disease Mother    Diabetes Mother    Hypertension Mother    Heart attack Mother    Diabetes Other    Kidney disease Other    Cardiomyopathy Other    Diabetes Daughter    Diabetes Son    Hypertension Son      ROS:  Please see the history of present illness.   All other ROS reviewed and negative.     Physical Exam/Data:   Vitals:   12/19/2018 1000 01/06/2019 1200 01/13/2019 1337 01/03/2019 1400  BP: (!) 128/56 (!) 126/53 (!) 126/53 (!) 108/94  Pulse: 68 67 72 70  Resp: (!) 28 (!) 28 (!) 34 (!) 24  Temp:      TempSrc:      SpO2: 90% 95% 94% 99%    Intake/Output Summary (Last 24 hours) at 12/23/2018 1729 Last data filed at 01/02/2019 2122 Gross per 24 hour  Intake 500 ml  Output --  Net 500 ml   Last 3 Weights 12/22/2018 12/20/2018 10/13/2017  Weight (lbs) 171 lb 171 lb 172 lb  Weight (kg) 77.565 kg 77.565 kg 78.019 kg     There is no height or weight on file to calculate BMI.  General:  Elderly female in no acute  distress HEENT: normal Neck: no JVD Endocrine:  No thryomegaly Vascular: No carotid bruits  Cardiac:  normal S1, S2; RRR; no murmur  Lungs: respirations unlabored Abd: soft, nontender, no hepatomegaly  Ext: no edema Musculoskeletal:  No deformities, BUE and BLE strength normal and equal Skin: warm and dry  Neuro:  CNs 2-12 intact, no focal abnormalities noted Psych:  Normal affect   EKG:  The EKG was personally reviewed and demonstrates:  Afib with RVR Telemetry:  Telemetry was personally reviewed and demonstrates:  Converted to sinus rhythm overnight  Relevant CV Studies:  Heart monitor 09/08/17: No Afib  Echo 09/08/17: Study Conclusions - Left ventricle: The cavity size was mildly dilated. Systolic   function was moderately to severely reduced. The estimated   ejection fraction was in the range of 30% to 35%. Diffuse   hypokinesis. Doppler parameters are consistent with a reversible   restrictive pattern, indicative of decreased left ventricular   diastolic compliance and/or increased left atrial pressure (grade   3 diastolic dysfunction). - Aortic valve: Trileaflet; mildly thickened, mildly calcified   leaflets. There was trivial regurgitation. -  Aorta: The aorta was mildly calcified. - Mitral valve: Severely calcified annulus. - Left atrium: The atrium was mildly dilated. Volume/bsa, S: 42.4   ml/m^2. - Right ventricle: The cavity size was mildly dilated. Wall   thickness was normal. - Tricuspid valve: There was trivial regurgitation. - Pulmonary arteries: Systolic pressure was mildly to moderately   increased. PA peak pressure: 47 mm Hg (S).   Left heart cath 02/05/15:  1st Diag lesion, 100% stenosed.  Mid RCA lesion, 100% stenosed. faint left to right collaterals are present.  There is severe left ventricular systolic dysfunction, which is out of proportion to the degree of coronary artery disease.  Component of nonischemic cardiomyopathy  present.   Laboratory Data:  High Sensitivity Troponin:   Recent Labs  Lab 01/14/2019 2045 12/29/2018 2314 01/06/2019 0234 01/11/2019 0502  TROPONINIHS 222* 318* 755* 764*     Chemistry Recent Labs  Lab 12/22/18 1200 12/18/2018 2314 12/21/2018 0234  NA 139 130* 136  K 4.0 4.3 4.5  CL 88* 86* 89*  CO2 31 20* 22  GLUCOSE 140* 456* 302*  BUN 36* 39* 45*  CREATININE 7.85* 6.84* 7.44*  CALCIUM 8.1* 7.4* 7.8*  GFRNONAA 5* 5* 5*  GFRAA 5* 6* 6*  ANIONGAP 20* 24* 25*    Recent Labs  Lab 12/22/18 1200 01/08/2019 2314 01/02/2019 0234  PROT 7.0 6.0* 6.0*  ALBUMIN 3.5 2.6* 2.7*  AST 36 25 23  ALT _0 ALKPHOS 60 47 47  BILITOT 1.1 1.7* 1.3*   Hematology Recent Labs  Lab 12/22/18 1200 01/13/2019 2045 01/07/2019 0234  WBC 6.6 13.4* 12.7*  RBC 3.74* 3.70* 3.23*  HGB 12.2 11.7* 10.2*  HCT 37.8 37.9 31.5*  MCV 101.1* 102.4* 97.5  MCH 32.6 31.6 31.6  MCHC 32.3 30.9 32.4  RDW 14.8 14.2 14.2  PLT 97* 156 162   BNP Recent Labs  Lab 12/26/2018 0234  BNP 939.3*    DDimer  Recent Labs  Lab 12/22/18 1200 12/20/2018 2314 12/18/2018 0234  DDIMER 5.25* 3.21* 3.24*     Radiology/Studies:  Dg Chest Port 1 View  Result Date: 12/29/2018 CLINICAL DATA:  73 year old female COVID-19. Atrial fibrillation with RVR. EXAM: PORTABLE CHEST 1 VIEW COMPARISON:  12/22/2018 portable chest and earlier. FINDINGS: Portable AP semi upright view at 1932 hours. Pacer pads project over the left chest but there appears to be increasing streaky and confluent left perihilar and basilar opacity. Stable lung volumes. Stable cardiac size and mediastinal contours. Visualized tracheal air column is within normal limits. There is a chronic linear metal retained foreign body which is posterior to the medial right clavicle and stable from a CT in 2017. Allowing for portable technique the right lung remains clear. No pleural effusion Negative visible bowel gas pattern. Left axillary vascular stent. IMPRESSION: 1.  Increasing left lung opacity suspicious for COVID-19 pneumonia. 2. No pleural effusion.  Right lung remains clear. Electronically Signed   By: Genevie Ann M.D.   On: 01/08/2019 20:08    Assessment and Plan:   1. Atrial fibrillation with RVR - This patients CHA2DS2-VASc Score and unadjusted Ischemic Stroke Rate (% per year) is equal to 11.2 % stroke rate/year from a score of 7 (female, age, CHF, DM, CVA2, HTN) - start heparin for anticoagulation - RVR likely related to COVID infection and PNA - converted to NSR overnight - will transition IV cardizem to PO lopressor 25 mg BID - will also start amio PO load - high risk for converting back  to Afib during this acute illness    2.  Chronic systolic and diastolic heart failure - echo 2019 with EF of 30-35% - fluid management by HD   3. COVID-19 positive with lung changes on CXR - likely driving RVR - lactic 2.3, WBC 12.7   4. Hypertension - no home meds   5. CAD - heart cath in 2016 with 100% occlusion of D1 and mid RCA with faint collaterals, no intervention - has not been on BB, statin, or antiplatelet medication   OK to transfer to [facility].    For questions or updates, please contact [place] Please consult [email or web address] for contact info under     Signed, Ledora Bottcher, PA  12/29/2018 5:29 PM    I have reviewed the chart, notes and new data.  I agree with PA/NP's note.  Key new complaints: Substantial improvement in hemodynamics and mentation after conversion to sinus rhythm around midnight last night.  Palpitations and chest discomfort also resolved.  Continues to have persistent cough and dyspnea. She complains of her left leg not working properly, although it is not painful.  Key new findings / data: Telemetry shows sinus rhythm/mild sinus tachycardia.  No atrial fibrillation since roughly 2330 hrs.  Chest x-ray shows developing left lung infiltrate but no evidence of congestive heart failure.  Minor elevation in  cardiac troponin is consistent with demand ischemia in a patient with severe tachycardia and acute infection.  Elevated BNP of questionable value since we do not have a baseline and she has end-stage renal disease.  She has a history of moderately decreased ABI in her left lower extremity (anterior tibial 0.74, posterior tibial 0.66, monophasic).  There are no palpable pulses in the left foot and its unclear whether this is an acute or chronic finding.  PLAN: Although she has been deemed to be a poor candidate for chronic anticoagulation, this was because of risk of unsteady gait and risk of falls.  I think in the near term, since she is at high risk for thrombotic complications with active COVID-19 infection, history of strokes and a very recent spell of paroxysmal atrial fibrillation it is wise to treat her with intravenous heparin. It is also possible that she has embolized to her left leg due to atrial fibrillation or possibly has local thrombosis on top of pre-existing PAD. Reassess risk/benefit balance of anticoagulation after recovery from the acute illness. It is likely that she would deteriorate again if she has recurrent atrial fibrillation and this is a likely recurrence with acute pneumonia and viral infection.  Would treat with preventive amiodarone 200 mg twice daily until the acute illness abates.  If possible though, would avoid using this medication chronically beyond the acute illness. Recommend beta-blockers as the best choice for rate control medications in the case of recurrent atrial fibrillation.  These are preferred over diltiazem in a patient with depressed left ventricular systolic function. If she develops bradycardia, stop the amiodarone.   Sanda Klein, MD, [place] [phone] 12/23/2018, 5:41 PM

## 2018-12-28 NOTE — Progress Notes (Addendum)
This is a 73 year old female with past medical history of ESRD on HD (TTS), stroke with chronic left-sided weakness who lives at SNF, type 2 diabetes, chronic combined heart failure EF 30 to 35% who was recently diagnosed with COVID-19 this past Wednesday.  She was sent home with p.o. prednisone as she was not hypoxic.  Patient reported to dialysis on 10/13 and was noted to have a low SPO2 (unclear what she was satting).  She was sent home early and was told that she needs oxygen at the nursing home however she was sent to the ED immediately.  In the ER she was found to have A. fib with RVR and hypotension and was started on Cardizem drip with improvement in mentation, and vital signs.  Chest x-ray showed worsening multifocal pneumonia consistent with Covid.  She was started on CAP therapy with ceftriaxone and azithromycin.  Currently patient states that she is feeling much better.  She is eating lunch sitting up and tolerating her diet.  Says that she did have some palpitations and chest pain earlier which has resolved.  Also complains of persisting cough as noted prior.  Otherwise no other complaints.  Physical Exam Vitals signs and nursing note reviewed.  Constitutional:      Comments: Poor dentition Sitting upright and eating  HENT:     Head: Normocephalic.  Neck:     Musculoskeletal: Normal range of motion.  Pulmonary:     Comments: Coughing during exam. A fall pulmonary exam was not performed to limit exposure to COVID-19 patient. Neurological:     Mental Status: She is alert.  Psychiatric:        Mood and Affect: Mood is not anxious.        Behavior: Behavior is not agitated.    1. A. fib with RVR - CHA2DS2VASc: at least 7.  1. Continue Cardizem drip 2. Continue heparin 3. Cardiology consulted 2. Elevated troponin -likely demand ischemia.  Patient admitted to chest pain earlier, asymptomatic.  Currently on heparin drip as above.  ST depression noted inferior lateral  leads. 1. Cardiology consulted 2. Continue telemetry 3. COVID-19 -concern for possible superimposed bacterial infection versus heart failure decompensation 1. Continue p.o. prednisone 2. Continue Rocephin/azithromycin for now for possible CAP follow-up cultures 3. CBC and BMP in a.m. 4. Remdesivir not indicated at this time given 94% on room air 4. ESRD on HD (TTS) 1. Nephro consulted 5. Chronic combined CHF -elevated BNP, no prior to compare to.  1. Cardiology consulted 6. Diabetes -HA1C 8.7 1. Continue current source, may need to increase tomorrow   Marva Panda, DO Internal medicine  Pager number [phone]

## 2018-12-28 NOTE — Progress Notes (Signed)
Patient's home OP HD clinic is [place]. Given COVID positive status, she has already been dialyzing at the [place] isolation shift at [facility], TTS 12:00pm. She will need to resume this schedule at discharge.   Alphonzo Cruise, [place] Renal Navigator [phone]

## 2018-12-28 NOTE — Consult Note (Addendum)
NAME:  Stefanie [name], MRN:  [id number], DOB:  1945-08-10, LOS: 0 ADMISSION DATE:  12/15/2018, CONSULTATION DATE:  01/13/2019 REFERRING MD:  TRH, CHIEF COMPLAINT:  resp insufficiency   Brief History   27 yoF SNF resident with recent [place] diagnosis presenting with reports hypoxia and found to be in Afib with RVR and CXR with multifocal inflitrates.  Has not had any O2 requirement or respiratory distress.  Admitted by [facility] and started on heparin and cardizem.  On 10/14 found to have acute left ischemic leg taken to OR for emergent thrombectomy and returns to ICU postoperatively on mechanical ventilation.  PCCM consulted for further management.   History of present illness   HPI obtained from medical chart review as patient is sedated on mechanical ventilation.    73 year old female who resides in SNF with history of ESRD on TTS HD, CVA with residual left sided weakness, DMT2, chronic systolic/ diastolic HF with EF 99991111 and recent COVID diagnosis on 12/20/2018 presenting with reports of low oxygen levels.  She is a DNR.    Apparently sent home from dialysis early on 10/13 and told she needed oxygen. Patient was evaluated in ER on 10/8 with nausea and vomiting but no SOB or found to be hypoxic discharged back to SNF on prednisone.  On presentation to ER, found to be hypotensive and in Afib with RVR in which hypotension resolved and rate improved with cardizem.  CXR showed worsening multifocal pneumonia but saturations stable on room air.  She was started on heparin gtt and admitted to [facility] with nephrology and cardiology consulting.  Patient doing well on room air.  On 10/14, she was found to have acute left ischemic leg in which she was emergently taken to OR by vascular surgery for successful thrombectomy.  Patient returns to ICU on mechanical ventilation and hemodynamically stable.  There was concern against extubation given saturations of 94% on FiO2 50.  PCCM consulted for vent management.   Past  Medical History  ESRD on TTS HD, CVA with residual left sided weakness, DMT2, chronic systolic/ diastolic HF with EF 99991111, CAD  SNF resident.  COVID dx 12/20/2018  Significant Hospital Events   10/13 Admit  Consults:  Cards Nephrology Vascular surgery  PCCM  Procedures:  10/14 OR- left femoral/ left iliac thrombectomy  10/14 ETT >>  Significant Diagnostic Tests:   Micro Data:  10/6 SARS CoV 2>> positive   10/13 BCx 2>> 10/14 MRSA PCR >>  Antimicrobials:  10/13 ceftriaxone 10/13 azithromycin 10/14 cefazolin  Interim history/subjective:  Arrived on ICU on propofol 40 mcg/kg/min and cardizem gtt.  Was in NSR and hypotensive therefore cardizem d/c'd and propofol decreased.    Objective   Blood pressure (!) 113/50, pulse 60, temperature (!) 97.5 F (36.4 C), resp. rate 18, height 5\' 2"  (1.575 m), weight 76.4 kg, SpO2 100 %.    Vent Mode: PRVC FiO2 (%):  [100 %] 100 % Set Rate:  [18 bmp] 18 bmp Vt Set:  [400 mL] 400 mL PEEP:  [5 cmH20] 5 cmH20 Plateau Pressure:  [18 cmH20] 18 cmH20   Intake/Output Summary (Last 24 hours) at 12/21/2018 2306 Last data filed at 12/16/2018 2141 Gross per 24 hour  Intake 300 ml  Output 100 ml  Net 200 ml   Filed Weights   01/02/2019 2210  Weight: 76.4 kg   Examination: General:  Elderly female in NAD on MV HEENT: MM pink/moist, ETT/ OGT, pupils 4/reactive Neuro: Awakens to verbal, f/c, MAE -  weakly  CV: RR- SR 61, no murmur, left thigh AVF +b/t, left groin site slightly oozy- no frank hematoma, faint distal pedal pulses, leg warm perfused PULM:  MV supported breaths not breathing over vent, coarse BS more so on R, no secretions GI: obese, soft, bs active  Extremities: warm/dry, trace LE edema, old graft sites Skin: no rashes  Resolved Hospital Problem list    Assessment & Plan:   Acute respiratory insufficency related to surgery COVID PNA - prior to OR was doing well on room air P:  Full MV support, PRVC 8 cc/kg, rate 16  CXR now, ABG in 1 hour  (reviewed- retract ETT by 2cm and reduce rate to 12) Continue azithro/ ceftriaxone and prednisone 40mg  daily  VAP bundle xopenex prn  D/c propofol and change to precedex to help facilitate extubation, prn fentanyl for RASS goal 0/-1  Left ischemic leg s/p thrombectomy  P:  Per Vascular Frequent neurovascular / groin checks   Afib with RVR - currently SR P:  Cards following Continue heparin gtt Tele monitoring cardizem prn for HR < 120 Continue lopressor per tube as hypotension is resolved    ESRD on HD P:  Nephrology following  BMP in am    Chronic Systolic/ diastolic HF HTN P:  HD as above Prn apresoline for SBP   DM P:  CBG q 4 with SSI, levemir   Best practice:  Diet: NPO Pain/Anxiety/Delirium protocol (if indicated): precedex/ prn fentanyl  VAP protocol (if indicated): yes DVT prophylaxis: heparin IV GI prophylaxis: PPI Glucose control: SSI q 4 Mobility: BR Code Status: DNR Family Communication: pending Disposition: ICU for now  Labs   CBC: Recent Labs  Lab 12/22/18 1200 01/04/2019 2045 12/23/2018 0234  WBC 6.6 13.4* 12.7*  NEUTROABS 5.1 12.2* 11.4*  HGB 12.2 11.7* 10.2*  HCT 37.8 37.9 31.5*  MCV 101.1* 102.4* 97.5  PLT 97* 156 0000000    Basic Metabolic Panel: Recent Labs  Lab 12/22/18 1200 12/26/2018 2314 01/08/2019 0234  NA 139 130* 136  K 4.0 4.3 4.5  CL 88* 86* 89*  CO2 31 20* 22  GLUCOSE 140* 456* 302*  BUN 36* 39* 45*  CREATININE 7.85* 6.84* 7.44*  CALCIUM 8.1* 7.4* 7.8*   GFR: Estimated Creatinine Clearance: 6.4 mL/min (A) (by C-G formula based on SCr of 7.44 mg/dL (H)). Recent Labs  Lab 12/22/18 1200 12/15/2018 2030 12/18/2018 2045 01/13/2019 2314 12/21/2018 0234  PROCALCITON 9.44  --   --   --   --   WBC 6.6  --  13.4*  --  12.7*  LATICACIDVEN 1.4 2.8*  --  2.3*  --     Liver Function Tests: Recent Labs  Lab 12/22/18 1200 12/16/2018 2314 12/26/2018 0234  AST 36 25 23  ALT 19 18 15   ALKPHOS 60 47 47   BILITOT 1.1 1.7* 1.3*  PROT 7.0 6.0* 6.0*  ALBUMIN 3.5 2.6* 2.7*   No results for input(s): LIPASE, AMYLASE in the last 168 hours. No results for input(s): AMMONIA in the last 168 hours.  ABG    Component Value Date/Time   TCO2 33 04/28/2016 2024     Coagulation Profile: No results for input(s): INR, PROTIME in the last 168 hours.  Cardiac Enzymes: No results for input(s): CKTOTAL, CKMB, CKMBINDEX, TROPONINI in the last 168 hours.  HbA1C: Hgb A1c MFr Bld  Date/Time Value Ref Range Status  12/21/2018 02:34 AM 8.7 (H) 4.8 - 5.6 % Final    Comment:    (  NOTE) Pre diabetes:          5.7%-6.4% Diabetes:              >6.4% Glycemic control for   <7.0% adults with diabetes   08/01/2017 05:57 AM 7.7 (H) 4.8 - 5.6 % Final    Comment:    (NOTE) Pre diabetes:          5.7%-6.4% Diabetes:              >6.4% Glycemic control for   <7.0% adults with diabetes     CBG: Recent Labs  Lab 12/15/2018 1908 12/27/2018 0142 12/16/2018 0841 01/01/2019 1331 12/19/2018 1731  GLUCAP 260* 259* 260* 226* 301*    Review of Systems:   Unable  Past Medical History  She,  has a past medical history of Anemia, CAD (coronary artery disease), CHF (congestive heart failure) ([place]), Constipation, CVA (cerebral vascular accident) ([place]) ([place]), Diabetes mellitus, End stage renal disease ([place]), cardiovascular stress test, echocardiogram, Hyperlipidemia, Hypertension, Protein malnutrition ([place]), Renal insufficiency, Retinal detachment, bilateral, and Secondary hyperparathyroidism ([place]).   Surgical History    Past Surgical History:  Procedure Laterality Date  . ARTERIOVENOUS GRAFT PLACEMENT     BUE numerous times  . ARTERIOVENOUS GRAFT PLACEMENT  11/13/10   Lt femoral - Dr. Kellie Simmering  . BIOPSY  08/03/2017   Procedure: BIOPSY;  Surgeon: Clarene Essex, MD;  Location: [place];  Service: Endoscopy;;  . BRONCHIAL BRUSHINGS  08/03/2017   Procedure: Esophageal  BRUSHINGS;  Surgeon: Clarene Essex, MD;   Location: [place];  Service: Endoscopy;;  esophageal brushing  . CARDIAC CATHETERIZATION N/A 02/05/2015   Procedure: Left Heart Cath and Coronary Angiography;  Surgeon: Jettie Booze, MD;  Location: [place] CV LAB;  Service: Cardiovascular;  Laterality: N/A;  . CATARACT EXTRACTION    . DG AV DIALYSIS GRAFT DECLOT OR  06/06/11, 08/26/11   performed in IR  . ESOPHAGOGASTRODUODENOSCOPY N/A 08/03/2017   Procedure: ESOPHAGOGASTRODUODENOSCOPY (EGD);  Surgeon: Clarene Essex, MD;  Location: [place];  Service: Endoscopy;  Laterality: N/A;  . PERIPHERAL VASCULAR CATHETERIZATION N/A 02/06/2015   Procedure: A/V Shuntogram;  Surgeon: Elam Dutch, MD;  Location: [place] CV LAB;  Service: Cardiovascular;  Laterality: N/A;  . PERIPHERAL VASCULAR CATHETERIZATION Left 02/26/2016   Procedure: A/V Shuntogram;  Surgeon: Waynetta Sandy, MD;  Location: [place] [facility] LAB;  Service: Cardiovascular;  Laterality: Left;  . PERIPHERAL VASCULAR CATHETERIZATION Left 02/26/2016   Procedure: Peripheral Vascular Balloon Angioplasty;  Surgeon: Waynetta Sandy, MD;  Location: [place] CV LAB;  Service: Cardiovascular;  Laterality: Left;  Lt thigh avf  . SHUNTOGRAM N/A 05/29/2013   Procedure: Earney Mallet;  Surgeon: Conrad , MD;  Location: [facility] CATH LAB;  Service: Cardiovascular;  Laterality: N/A;  . SHUNTOGRAM Left 03/01/2014   Procedure: Earney Mallet;  Surgeon: Conrad , MD;  [facility] C[facility] LAB;  Service: Cardiovascular;  Laterality: Left;  . TUBAL LIGATION       Social History   reports that she quit smoking about 35 years ago. She has never used smokeless tobacco. She reports that she does not drink alcohol or use drugs.   Family History   Her family history includes Cardiomyopathy in an other family member; Diabetes in her daughter, mother, son, and another family member; Heart attack in her mother; Heart disease in her mother; Hypertension in her mother and son; Kidney  disease in an other family member.   Allergies No Known Allergies   Home  Medications  Prior to Admission medications   Medication Sig Start Date End Date Taking? Authorizing Provider  acetaminophen (TYLENOL) 325 MG tablet Take 650 mg by mouth 3 (three) times daily as needed (for pain- cannot exceed 3 grams/24 hours).    Yes [provider]  b complex-vitamin c-folic acid (NEPHRO-VITE) 0.8 MG TABS tablet Take 1 tablet by mouth every Monday, Wednesday, and Friday.    Yes [provider]  calcium acetate (PHOSLO) 667 MG capsule Take 667-2,001 mg by mouth See admin instructions. Take 2,001 mg by mouth three times a day with meals and 667 mg two times a day with snacks   Yes [provider]  cinacalcet (SENSIPAR) 30 MG tablet Take 1 tablet (30 mg total) by mouth Every Tuesday,Thursday,and Saturday with dialysis. 08/05/17  Yes Hosie Poisson, MD  Darbepoetin Alfa (ARANESP) 100 MCG/0.5ML SOSY injection Inject 0.5 mLs (100 mcg total) into the vein every Thursday with hemodialysis. 08/05/17  Yes Hosie Poisson, MD  diclofenac sodium (VOLTAREN) 1 % GEL Apply 2 g topically See admin instructions. Apply 2 grams to both knees three times daily as needed for pain and to right hand 2-3 times a day as needed for pain (BRAND NAME VOLTAREN)   Yes [provider]  feeding supplement, GLUCERNA SHAKE, (GLUCERNA SHAKE) LIQD Take 237 mLs by mouth 2 (two) times daily as needed (weight loss).   Yes [provider]  glipiZIDE (GLUCOTROL) 10 MG tablet Take 10 mg by mouth 2 (two) times daily before a meal.    Yes [provider]  Glycerin-Hypromellose-PEG 400 (ARTIFICIAL TEARS) 0.2-0.2-1 % SOLN Place 1 drop into both eyes 4 (four) times daily as needed (dry eyes).   Yes [provider]  latanoprost (XALATAN) 0.005 % ophthalmic solution Place 1 drop into both eyes at bedtime.    Yes [provider]  LEVEMIR FLEXTOUCH 100 UNIT/ML Pen Inject 12 Units into the  skin at bedtime.  09/15/17  Yes [provider]  lidocaine-prilocaine (EMLA) cream Apply 1 application topically as needed (topical anesthesia for hemodialysis if Gebauers and Lidocaine injection are ineffective.). 02/25/15  Yes Love, Ivan Anchors, PA-C  loratadine (CLARITIN) 10 MG tablet Take 10 mg by mouth daily.   Yes [provider]  Melatonin 5 MG TABS Take 5 mg by mouth at bedtime.   Yes [provider]  neomycin-bacitracin-polymyxin (NEOSPORIN) ointment Apply 1 application topically 2 (two) times daily as needed for wound care.    Yes [provider]  pantoprazole (PROTONIX) 40 MG tablet Take 1 tablet (40 mg total) by mouth 2 (two) times daily. Patient taking differently: Take 40 mg by mouth daily before breakfast.  08/05/17  Yes Hosie Poisson, MD  Polyethylene Glycol 3350 (PEG 3350 PO) Take 17 g by mouth 3 (three) times daily as needed (for constipation).   Yes [provider]  pravastatin (PRAVACHOL) 20 MG tablet Take 20 mg by mouth at bedtime.   Yes [provider]  predniSONE (DELTASONE) 20 MG tablet 3 tabs po x 3 days, then 2 tabs daily x 3 days, then 1 tab x 3 days, then 1/2 tab x 3 days Patient taking differently: Take 40 mg by mouth daily. FOR 3 DAYS: Start date 12/18/2018  End date 12/29/2018 12/22/18  Yes Domenic Moras, PA-C  rOPINIRole (REQUIP) 0.25 MG tablet Take 0.25 mg by mouth daily as needed (restless legs).   Yes [provider]  rOPINIRole (REQUIP) 1 MG tablet Take 1 mg by mouth at bedtime.  Yes [provider]  sennosides-docusate sodium (SENOKOT-S) 8.6-50 MG tablet Take 2 tablets by mouth at bedtime as needed for constipation.    Yes [provider]  sorbitol 70 % solution Take 30 mLs by mouth daily as needed (for constipation).   Yes [provider]  traZODone (DESYREL) 50 MG tablet Take 25 mg by mouth at bedtime as needed for sleep.  07/06/17  Yes [provider]     CRITICAL CARE  Performed by: Kennieth Rad     Total critical care time: 45 minutes   Critical care time was exclusive of separately billable procedures and treating other patients.   Critical care was necessary to treat or prevent imminent or life-threatening deterioration.   Critical care was time spent personally by me on the following activities: development of treatment plan with patient and/or surrogate as well as nursing, discussions with consultants, evaluation of patient's response to treatment, examination of patient, obtaining history from patient or surrogate, ordering and performing treatments and interventions, ordering and review of laboratory studies, ordering and review of radiographic studies, pulse oximetry and re-evaluation of patient's condition.   Patient seen in concert with NP Moshe Cipro, chart reviewed and critical care planning discussed with NP and nursing staff.  Plan will be to keep sedated tonight with revaluation for extubation in am.  Dr Laurelyn Sickle MD   Kennieth Rad, MSN, AGACNP-BC [place] Pgr: [phone] or if no answer [phone] 12/22/2018, 11:56 PM

## 2018-12-29 ENCOUNTER — Encounter (HOSPITAL_COMMUNITY): Payer: Self-pay | Admitting: Vascular Surgery

## 2018-12-29 ENCOUNTER — Inpatient Hospital Stay (HOSPITAL_COMMUNITY): Payer: Medicare Other

## 2018-12-29 DIAGNOSIS — I4891 Unspecified atrial fibrillation: Secondary | ICD-10-CM

## 2018-12-29 DIAGNOSIS — J9601 Acute respiratory failure with hypoxia: Secondary | ICD-10-CM

## 2018-12-29 DIAGNOSIS — I959 Hypotension, unspecified: Secondary | ICD-10-CM

## 2018-12-29 DIAGNOSIS — U071 COVID-19: Secondary | ICD-10-CM

## 2018-12-29 LAB — PHOSPHORUS
Phosphorus: 1.3 mg/dL — ABNORMAL LOW (ref 2.5–4.6)
Phosphorus: 3.4 mg/dL (ref 2.5–4.6)

## 2018-12-29 LAB — CULTURE, BLOOD (ROUTINE X 2)

## 2018-12-29 LAB — CBC WITH DIFFERENTIAL/PLATELET
Abs Immature Granulocytes: 0.31 10*3/uL — ABNORMAL HIGH (ref 0.00–0.07)
Basophils Absolute: 0 10*3/uL (ref 0.0–0.1)
Basophils Relative: 0 %
Eosinophils Absolute: 0 10*3/uL (ref 0.0–0.5)
Eosinophils Relative: 0 %
HCT: 28.8 % — ABNORMAL LOW (ref 36.0–46.0)
Hemoglobin: 9.6 g/dL — ABNORMAL LOW (ref 12.0–15.0)
Immature Granulocytes: 2 %
Lymphocytes Relative: 4 %
Lymphs Abs: 0.5 10*3/uL — ABNORMAL LOW (ref 0.7–4.0)
MCH: 31.6 pg (ref 26.0–34.0)
MCHC: 33.3 g/dL (ref 30.0–36.0)
MCV: 94.7 fL (ref 80.0–100.0)
Monocytes Absolute: 0.2 10*3/uL (ref 0.1–1.0)
Monocytes Relative: 2 %
Neutro Abs: 12.7 10*3/uL — ABNORMAL HIGH (ref 1.7–7.7)
Neutrophils Relative %: 92 %
Platelets: 162 10*3/uL (ref 150–400)
RBC: 3.04 MIL/uL — ABNORMAL LOW (ref 3.87–5.11)
RDW: 14.5 % (ref 11.5–15.5)
WBC: 13.8 10*3/uL — ABNORMAL HIGH (ref 4.0–10.5)
nRBC: 0 % (ref 0.0–0.2)

## 2018-12-29 LAB — GLUCOSE, CAPILLARY
Glucose-Capillary: 301 mg/dL — ABNORMAL HIGH (ref 70–99)
Glucose-Capillary: 306 mg/dL — ABNORMAL HIGH (ref 70–99)
Glucose-Capillary: 232 mg/dL — ABNORMAL HIGH (ref 70–99)
Glucose-Capillary: 197 mg/dL — ABNORMAL HIGH (ref 70–99)
Glucose-Capillary: 160 mg/dL — ABNORMAL HIGH (ref 70–99)
Glucose-Capillary: 191 mg/dL — ABNORMAL HIGH (ref 70–99)

## 2018-12-29 LAB — C-REACTIVE PROTEIN: CRP: 7.4 mg/dL — ABNORMAL HIGH (ref <1.0)

## 2018-12-29 LAB — HEMOGLOBIN AND HEMATOCRIT, BLOOD
HCT: 29.2 % — ABNORMAL LOW (ref 36.0–46.0)
Hemoglobin: 10.1 g/dL — ABNORMAL LOW (ref 12.0–15.0)

## 2018-12-29 LAB — MAGNESIUM
Magnesium: 0.6 mg/dL — CL (ref 1.7–2.4)
Magnesium: 2.6 mg/dL — ABNORMAL HIGH (ref 1.7–2.4)

## 2018-12-29 LAB — COMPREHENSIVE METABOLIC PANEL
ALT: 18 U/L (ref 0–44)
AST: 35 U/L (ref 15–41)
Albumin: 2.5 g/dL — ABNORMAL LOW (ref 3.5–5.0)
Alkaline Phosphatase: 48 U/L (ref 38–126)
Anion gap: 21 — ABNORMAL HIGH (ref 5–15)
BUN: 61 mg/dL — ABNORMAL HIGH (ref 8–23)
CO2: 25 mmol/L (ref 22–32)
Calcium: 7.5 mg/dL — ABNORMAL LOW (ref 8.9–10.3)
Chloride: 90 mmol/L — ABNORMAL LOW (ref 98–111)
Creatinine, Ser: 8.64 mg/dL — ABNORMAL HIGH (ref 0.44–1.00)
GFR calc Af Amer: 5 mL/min — ABNORMAL LOW (ref >60)
GFR calc non Af Amer: 4 mL/min — ABNORMAL LOW (ref >60)
Glucose, Bld: 347 mg/dL — ABNORMAL HIGH (ref 70–99)
Potassium: 4.4 mmol/L (ref 3.5–5.1)
Sodium: 136 mmol/L (ref 135–145)
Total Bilirubin: 0.7 mg/dL (ref 0.3–1.2)
Total Protein: 5.9 g/dL — ABNORMAL LOW (ref 6.5–8.1)

## 2018-12-29 LAB — BLOOD CULTURE ID PANEL (REFLEXED)

## 2018-12-29 LAB — POCT I-STAT 7, (LYTES, BLD GAS, ICA,H+H)
Acid-Base Excess: 5 mmol/L — ABNORMAL HIGH (ref 0.0–2.0)
Bicarbonate: 27.8 mmol/L (ref 20.0–28.0)
Calcium, Ion: 0.87 mmol/L — CL (ref 1.15–1.40)
HCT: 29 % — ABNORMAL LOW (ref 36.0–46.0)
Hemoglobin: 9.9 g/dL — ABNORMAL LOW (ref 12.0–15.0)
O2 Saturation: 98 %
Patient temperature: 97.5
Potassium: 4.5 mmol/L (ref 3.5–5.1)
Sodium: 133 mmol/L — ABNORMAL LOW (ref 135–145)
TCO2: 29 mmol/L (ref 22–32)
pCO2 arterial: 32.2 mmHg (ref 32.0–48.0)
pH, Arterial: 7.541 — ABNORMAL HIGH (ref 7.350–7.450)
pO2, Arterial: 83 mmHg (ref 83.0–108.0)

## 2018-12-29 LAB — D-DIMER, QUANTITATIVE: D-Dimer, Quant: 3.5 ug/mL-FEU — ABNORMAL HIGH (ref 0.00–0.50)

## 2018-12-29 LAB — HEPARIN LEVEL (UNFRACTIONATED)
Heparin Unfractionated: 1.08 IU/mL — ABNORMAL HIGH (ref 0.30–0.70)
Heparin Unfractionated: 0.39 IU/mL (ref 0.30–0.70)

## 2018-12-29 LAB — MRSA PCR SCREENING: MRSA by PCR: NEGATIVE

## 2018-12-29 MED ORDER — LEVALBUTEROL HCL 0.63 MG/3ML IN NEBU
0.6300 mg | INHALATION_SOLUTION | Freq: Four times a day (QID) | RESPIRATORY_TRACT | Status: DC
  Administered 2018-12-29 – 2018-12-31 (×7): 0.63 mg via RESPIRATORY_TRACT
  Filled 2018-12-29 (×7): qty 3

## 2018-12-29 MED ORDER — INSULIN DETEMIR 100 UNIT/ML ~~LOC~~ SOLN
12.0000 [IU] | Freq: Every day | SUBCUTANEOUS | Status: DC
  Administered 2018-12-29 – 2018-12-30 (×2): 12 [IU] via SUBCUTANEOUS
  Filled 2018-12-29 (×3): qty 0.12

## 2018-12-29 MED ORDER — ALBUMIN HUMAN 5 % IV SOLN
25.0000 g | Freq: Once | INTRAVENOUS | Status: AC
  Administered 2018-12-29: 07:00:00 25 g via INTRAVENOUS
  Filled 2018-12-29: qty 500

## 2018-12-29 MED ORDER — PRO-STAT SUGAR FREE PO LIQD
30.0000 mL | Freq: Two times a day (BID) | ORAL | Status: DC
  Administered 2018-12-29: 13:00:00 30 mL
  Filled 2018-12-29: qty 30

## 2018-12-29 MED ORDER — SODIUM CHLORIDE 0.9 % IV SOLN: 250.0000 mL | INTRAVENOUS | Status: DC

## 2018-12-29 MED ORDER — DOXERCALCIFEROL 4 MCG/2ML IV SOLN: 3.0000 ug | INTRAVENOUS | Status: DC

## 2018-12-29 MED ORDER — NOREPINEPHRINE 4 MG/250ML-% IV SOLN
2.0000 ug/min | INTRAVENOUS | Status: DC
  Filled 2018-12-29: qty 250

## 2018-12-29 MED ORDER — MIDODRINE HCL 5 MG PO TABS
10.0000 mg | ORAL_TABLET | Freq: Three times a day (TID) | ORAL | Status: DC
  Administered 2018-12-29 – 2018-12-31 (×7): 10 mg via ORAL
  Filled 2018-12-29 (×7): qty 2

## 2018-12-29 MED ORDER — HYDRALAZINE HCL 20 MG/ML IJ SOLN
10.0000 mg | INTRAMUSCULAR | Status: DC | PRN
  Filled 2018-12-29: qty 1

## 2018-12-29 MED ORDER — VITAL HIGH PROTEIN PO LIQD
1000.0000 mL | ORAL | Status: DC
  Administered 2018-12-30: 11:00:00 1000 mL

## 2018-12-29 MED ORDER — HEPARIN (PORCINE) 25000 UT/250ML-% IV SOLN
950.0000 [IU]/h | INTRAVENOUS | Status: DC
  Administered 2018-12-29 – 2018-12-30 (×2): 950 [IU]/h via INTRAVENOUS
  Filled 2018-12-29 (×2): qty 250

## 2018-12-29 MED ORDER — PRO-STAT SUGAR FREE PO LIQD
30.0000 mL | Freq: Three times a day (TID) | ORAL | Status: DC
  Administered 2018-12-29 – 2018-12-31 (×5): 30 mL
  Filled 2018-12-29 (×5): qty 30

## 2018-12-29 MED ORDER — VITAL HIGH PROTEIN PO LIQD
1000.0000 mL | ORAL | Status: DC
  Administered 2018-12-29: 13:00:00 1000 mL
  Filled 2018-12-29: qty 1000

## 2018-12-29 MED ORDER — IPRATROPIUM BROMIDE 0.02 % IN SOLN
0.5000 mg | Freq: Four times a day (QID) | RESPIRATORY_TRACT | Status: DC
  Administered 2018-12-29 – 2018-12-31 (×8): 0.5 mg via RESPIRATORY_TRACT
  Filled 2018-12-29 (×7): qty 2.5

## 2018-12-29 MED ORDER — FENTANYL 2500MCG IN NS 250ML (10MCG/ML) PREMIX INFUSION: 0.0000 ug/h | INTRAVENOUS | Status: DC

## 2018-12-29 MED ORDER — B COMPLEX-C PO TABS
1.0000 | ORAL_TABLET | Freq: Every day | ORAL | Status: DC
  Administered 2018-12-29 – 2018-12-31 (×3): 1 via ORAL
  Filled 2018-12-29 (×3): qty 1

## 2018-12-29 MED ORDER — SODIUM PHOSPHATES 45 MMOLE/15ML IV SOLN
20.0000 mmol | Freq: Once | INTRAVENOUS | Status: AC
  Administered 2018-12-29: 18:00:00 20 mmol via INTRAVENOUS
  Filled 2018-12-29: qty 6.67

## 2018-12-29 MED ORDER — MAGNESIUM SULFATE 4 GM/100ML IV SOLN
4.0000 g | Freq: Once | INTRAVENOUS | Status: AC
  Administered 2018-12-29: 15:00:00 4 g via INTRAVENOUS
  Filled 2018-12-29: qty 100

## 2018-12-29 MED ORDER — PHENYLEPHRINE HCL-NACL 10-0.9 MG/250ML-% IV SOLN
0.0000 ug/min | INTRAVENOUS | Status: DC
  Administered 2018-12-29: 75 ug/min via INTRAVENOUS
  Administered 2018-12-29: 09:00:00 70 ug/min via INTRAVENOUS
  Filled 2018-12-29 (×2): qty 250

## 2018-12-29 MED ORDER — SODIUM CHLORIDE 0.9 % IV SOLN
2.0000 g | INTRAVENOUS | Status: DC
  Administered 2018-12-29 – 2018-12-30 (×2): 2 g via INTRAVENOUS
  Filled 2018-12-29 (×2): qty 20

## 2018-12-29 NOTE — Consult Note (Addendum)
[facility] Renal Consultation Note    Indication for Consultation:  Management of ESRD/hemodialysis; anemia, hypertension/volume and secondary hyperparathyroidism PCP:  HPI: Stefanie [name] is a 73 y.o. female with ESRD on hemodialysis T,Th,S at [facility]. PMH: DM, HTN, CAD, combined systolic and diastolic HF, AF-was not on anticoagulation prior to admission, CVA with L sided weakness, AOCD, SHPT. Patient tested positive for COVID 19 12/22/2018 and was having HD at [facility] HD center.  Patient presented to ED 01/14/2019 after being sent home from HD D/T low O2 sats. Consequently she was sent from SNF to ED for evaluation D/T need for O2. Patient was noted to be in AFIB RVR which converted with Cardizem drip back to SR. Cardiology was consulted. In course of day, patient complained of pain in LLE. Nurse noted loss of pulse and coolness to affected extremity. VVS was consulted and she was taken to OR last PM for L femoral and iliac thrombectomy. She did have issues with bleeding post op, returned from OR intubated on ventilator.   Currently she is awake and following commands, still intubated requiring ventilator support.   Past Medical History:  Diagnosis Date  . Anemia   . CAD (coronary artery disease)   . CHF (congestive heart failure) ([place])   . Constipation   . CVA (cerebral vascular accident) ([place]) [place]   left sided weakness  . Diabetes mellitus   . End stage renal disease ([place])    initiated HD 2006  . Hx of cardiovascular stress test    a. MV 03/2010:  no ischemia, EF 55%  . Hx of echocardiogram    a. Echo 03/2010: EF 55-60%, Gr 1 diast dysfn, trivial MS, mild to mod LAE, PASP 34, ;  b. Echo 11/13: mild LVH, EF 60%, Gr 1 diast dysfn, Ao sclerosis, no AS, MAC, slight MS, mean 3 mmHg, PASP 34  . Hyperlipidemia   . Hypertension   . Protein malnutrition ([place])   . Renal insufficiency   . Retinal detachment, bilateral   . Secondary  hyperparathyroidism ([place])    Past Surgical History:  Procedure Laterality Date  . ARTERIOVENOUS GRAFT PLACEMENT     BUE numerous times  . ARTERIOVENOUS GRAFT PLACEMENT  11/13/10   Lt femoral - Dr. Kellie Simmering  . BIOPSY  08/03/2017   Procedure: BIOPSY;  Surgeon: Clarene Essex, MD;  Location: [facility];  Service: Endoscopy;;  . BRONCHIAL BRUSHINGS  08/03/2017   Procedure: Esophageal  BRUSHINGS;  Surgeon: Clarene Essex, MD;  Location: [place];  Service: Endoscopy;;  esophageal brushing  . CARDIAC CATHETERIZATION N/A 02/05/2015   Procedure: Left Heart Cath and Coronary Angiography;  Surgeon: Jettie Booze, MD;  Location: [place] CV [facility];  Service: Cardiovascular;  Laterality: N/A;  . CATARACT EXTRACTION    . DG AV DIALYSIS GRAFT DECLOT OR  06/06/11, 08/26/11   performed in IR  . ESOPHAGOGASTRODUODENOSCOPY N/A 08/03/2017   Procedure: ESOPHAGOGASTRODUODENOSCOPY (EGD);  Surgeon: Clarene Essex, MD;  Location: [place];  Service: Endoscopy;  Laterality: N/A;  . PERIPHERAL VASCULAR CATHETERIZATION N/A 02/06/2015   Procedure: A/V Shuntogram;  Surgeon: Elam Dutch, MD;  Location: [place] CV [facility];  Service: Cardiovascular;  Laterality: N/A;  . PERIPHERAL VASCULAR CATHETERIZATION Left 02/26/2016   Procedure: A/V Shuntogram;  Surgeon: Waynetta Sandy, MD;  Location: [place] [facility];  Service: Cardiovascular;  Laterality: Left;  . PERIPHERAL VASCULAR CATHETERIZATION Left 02/26/2016   Procedure: Peripheral Vascular Balloon Angioplasty;  Surgeon: Waynetta Sandy, MD;  Location: [place] [facility];  Service: Cardiovascular;  Laterality: Left;  Lt thigh avf  . SHUNTOGRAM N/A 05/29/2013   Procedure: Earney Mallet;  Surgeon: Conrad Red Lick, MD;  Location: [facility] CATH LAB;  Service: Cardiovascular;  Laterality: N/A;  . SHUNTOGRAM Left 03/01/2014   Procedure: Earney Mallet;  Surgeon: Conrad Mountainhome, MD;  Location: [facility] CATH LAB;  Service: Cardiovascular;  Laterality: Left;  . TUBAL LIGATION      Family History  Problem Relation Age of Onset  . Heart disease Mother   . Diabetes Mother   . Hypertension Mother   . Heart attack Mother   . Diabetes Other   . Kidney disease Other   . Cardiomyopathy Other   . Diabetes Daughter   . Diabetes Son   . Hypertension Son    Social History:  reports that she quit smoking about 35 years ago. She has never used smokeless tobacco. She reports that she does not drink alcohol or use drugs. No Known Allergies Prior to Admission medications   Medication Sig Start Date End Date Taking? Authorizing Provider  acetaminophen (TYLENOL) 325 MG tablet Take 650 mg by mouth 3 (three) times daily as needed (for pain- cannot exceed 3 grams/24 hours).    Yes [provider]  b complex-vitamin c-folic acid (NEPHRO-VITE) 0.8 MG TABS tablet Take 1 tablet by mouth every Monday, Wednesday, and Friday.    Yes [provider]  calcium acetate (PHOSLO) 667 MG capsule Take 667-2,001 mg by mouth See admin instructions. Take 2,001 mg by mouth three times a day with meals and 667 mg two times a day with snacks   Yes [provider]  cinacalcet (SENSIPAR) 30 MG tablet Take 1 tablet (30 mg total) by mouth Every Tuesday,Thursday,and Saturday with dialysis. 08/05/17  Yes Hosie Poisson, MD  Darbepoetin Alfa (ARANESP) 100 MCG/0.5ML SOSY injection Inject 0.5 mLs (100 mcg total) into the vein every Thursday with hemodialysis. 08/05/17  Yes Hosie Poisson, MD  diclofenac sodium (VOLTAREN) 1 % GEL Apply 2 g topically See admin instructions. Apply 2 grams to both knees three times daily as needed for pain and to right hand 2-3 times a day as needed for pain (BRAND NAME VOLTAREN)   Yes [provider]  feeding supplement, GLUCERNA SHAKE, (GLUCERNA SHAKE) LIQD Take 237 mLs by mouth 2 (two) times daily as needed (weight loss).   Yes [provider]  glipiZIDE (GLUCOTROL) 10 MG tablet Take 10 mg by mouth 2 (two) times daily before a meal.    Yes  [provider]  Glycerin-Hypromellose-PEG 400 (ARTIFICIAL TEARS) 0.2-0.2-1 % SOLN Place 1 drop into both eyes 4 (four) times daily as needed (dry eyes).   Yes [provider]  latanoprost (XALATAN) 0.005 % ophthalmic solution Place 1 drop into both eyes at bedtime.    Yes [provider]  LEVEMIR FLEXTOUCH 100 UNIT/ML Pen Inject 12 Units into the skin at bedtime.  09/15/17  Yes [provider]  lidocaine-prilocaine (EMLA) cream Apply 1 application topically as needed (topical anesthesia for hemodialysis if Gebauers and Lidocaine injection are ineffective.). 02/25/15  Yes Love, Ivan Anchors, PA-C  loratadine (CLARITIN) 10 MG tablet Take 10 mg by mouth daily.   Yes [provider]  Melatonin 5 MG TABS Take 5 mg by mouth at bedtime.   Yes [provider]  neomycin-bacitracin-polymyxin (NEOSPORIN) ointment Apply 1 application topically 2 (two) times daily as needed for wound care.    Yes [provider]  pantoprazole (PROTONIX)  40 MG tablet Take 1 tablet (40 mg total) by mouth 2 (two) times daily. Patient taking differently: Take 40 mg by mouth daily before breakfast.  08/05/17  Yes Hosie Poisson, MD  Polyethylene Glycol 3350 (PEG 3350 PO) Take 17 g by mouth 3 (three) times daily as needed (for constipation).   Yes [provider]  pravastatin (PRAVACHOL) 20 MG tablet Take 20 mg by mouth at bedtime.   Yes [provider]  predniSONE (DELTASONE) 20 MG tablet 3 tabs po x 3 days, then 2 tabs daily x 3 days, then 1 tab x 3 days, then 1/2 tab x 3 days Patient taking differently: Take 40 mg by mouth daily. FOR 3 DAYS: Start date 01/13/2019  End date 12/29/2018 12/22/18  Yes Domenic Moras, PA-C  rOPINIRole (REQUIP) 0.25 MG tablet Take 0.25 mg by mouth daily as needed (restless legs).   Yes [provider]  rOPINIRole (REQUIP) 1 MG tablet Take 1 mg by mouth at bedtime.    Yes [provider]  sennosides-docusate sodium  (SENOKOT-S) 8.6-50 MG tablet Take 2 tablets by mouth at bedtime as needed for constipation.    Yes [provider]  sorbitol 70 % solution Take 30 mLs by mouth daily as needed (for constipation).   Yes [provider]  traZODone (DESYREL) 50 MG tablet Take 25 mg by mouth at bedtime as needed for sleep.  07/06/17  Yes [provider]   Current Facility-Administered Medications  Medication Dose Route Frequency Provider Last Rate Last Dose  . 0.9 %  sodium chloride infusion   Intravenous PRN Harold Hedge, MD      . acetaminophen (TYLENOL) tablet 650 mg  650 mg Oral Q6H PRN Dagoberto Ligas, PA-C   650 mg at 12/15/2018 0847  . amiodarone (PACERONE) tablet 200 mg  200 mg Oral BID Dagoberto Ligas, PA-C      . bisacodyl (DULCOLAX) suppository 10 mg  10 mg Rectal Daily PRN Jennelle Human B, NP      . calcitRIOL (ROCALTROL) capsule 0.5 mcg  0.5 mcg Oral Q T,Th,S,Su Eveland, Matthew, PA-C      . calcium acetate (PHOSLO) capsule 2,001 mg  2,001 mg Oral TID WC Dagoberto Ligas, PA-C   2,001 mg at 01/09/2019 1332  . calcium acetate (PHOSLO) capsule 667 mg  667 mg Oral BID BM PRN Dagoberto Ligas, PA-C      . cefTRIAXone (ROCEPHIN) 1 g in sodium chloride 0.9 % 100 mL IVPB  1 g Intravenous Q24H Dagoberto Ligas, PA-C      . chlorhexidine gluconate (MEDLINE KIT) (PERIDEX) 0.12 % solution 15 mL  15 mL Mouth Rinse BID Jennelle Human B, NP   15 mL at 12/29/18 1013  . Chlorhexidine Gluconate Cloth 2 % PADS 6 each  6 each Topical Q0600 Dagoberto Ligas, PA-C   6 each at 12/29/18 1017  . cinacalcet (SENSIPAR) tablet 30 mg  30 mg Oral Q T,Th,Sa-HD Dagoberto Ligas, PA-C      . dexmedetomidine (PRECEDEX) 400 MCG/100ML (4 mcg/mL) infusion  0-1.2 mcg/kg/hr Intravenous Continuous Jennelle Human B, NP 3.82 mL/hr at 12/29/18 0500 0.2 mcg/kg/hr at 12/29/18 0500  . diltiazem (CARDIZEM) 125 mg in dextrose 5% 125 mL (1 mg/mL) infusion  5-15 mg/hr Intravenous Titrated Dagoberto Ligas, PA-C   Stopped at  01/10/2019 2236  . docusate (COLACE) 50 MG/5ML liquid 100 mg  100 mg Per Tube BID PRN Jennelle Human B, NP      . doxycycline (VIBRA-TABS) tablet 100 mg  100 mg Oral Q12H Dagoberto Ligas, PA-C   100 mg at 12/29/18 2620  . fentaNYL (SUBLIMAZE) injection 25 mcg  25 mcg Intravenous Q15 min PRN Jennelle Human B, NP      . fentaNYL (SUBLIMAZE) injection 25-100 mcg  25-100 mcg Intravenous Q30 min PRN Jennelle Human B, NP      . fentaNYL 252mg in NS 2582m(1078mml) infusion-PREMIX  0-200 mcg/hr Intravenous Continuous Ogan, Okoron[place]wo U, MD      . heparin ADULT infusion 100 units/mL (25000 units/250m50mdium chloride 0.45%)  950 Units/hr Intravenous Continuous BairRomona CurlsH      . hydrALAZINE (APRESOLINE) injection 10-20 mg  10-20 mg Intravenous Q4H PRN SimpJennelle HumanNP      . insulin aspart (novoLOG) injection 0-9 Units  0-9 Units Subcutaneous Q4H SimpJennelle HumanNP   3 Units at 12/29/18 1013  . insulin detemir (LEVEMIR) injection 12 Units  12 Units Subcutaneous QHS EvelDagoberto Ligas-C   12 Units at 12/16/2018 0143  . latanoprost (XALATAN) 0.005 % ophthalmic solution 1 drop  1 drop Both Eyes QHS EvelDagoberto Ligas-C   1 drop at 01/12/2019 0155  . levalbuterol (XOPENEX) nebulizer solution 0.63 mg  0.63 mg Nebulization Q6H PRN SimpJennelle HumanNP      . loratadine (CLARITIN) tablet 10 mg  10 mg Oral Daily EvelDagoberto Ligas-C   10 mg at 12/29/18 0956  . MEDLINE mouth rinse  15 mL Mouth Rinse 10 times per day SimpJennelle HumanNP   15 mL at 12/29/18 0957  . Melatonin TABS 4.5 mg  4.5 mg Oral QHS EvelDagoberto Ligas-C   4.5 mg at 01/13/2019 0153  . metoprolol tartrate (LOPRESSOR) tablet 25 mg  25 mg Oral BID EvelDagoberto Ligas-C      . pantoprazole (PROTONIX) injection 40 mg  40 mg Intravenous Daily SimpJennelle HumanNP   40 mg at 12/29/18 0957  . phenylephrine (NEOSYNEPHRINE) 10-0.9 MG/250ML-% infusion  0-400 mcg/min Intravenous Titrated OganFrederik Pear 105 mL/hr at 12/29/18 0910 70  mcg/min at 12/29/18 0910  . polyvinyl alcohol (LIQUIFILM TEARS) 1.4 % ophthalmic solution 1 drop  1 drop Both Eyes QID PRN EvelDagoberto Ligas-C      . pravastatin (PRAVACHOL) tablet 20 mg  20 mg Oral QHS Eveland, Matthew, PA-C      . predniSONE (DELTASONE) tablet 40 mg  40 mg Oral Q breakfast EvelDagoberto Ligas-C   40 mg at 12/24/2018 0847  . rOPINIRole (REQUIP) tablet 1 mg  1 mg Oral QHS EvelDagoberto Ligas-C   1 mg at 01/04/2019 0154  . sodium chloride flush (NS) 0.9 % injection 3 mL  3 mL Intravenous Q12H EvelDagoberto Ligas-C   3 mL at 12/29/18 1014   Labs: Basic Metabolic Panel: Recent Labs  Lab 12/15/2018 2314 01/03/2019 0234 12/29/18 0014 12/29/18 0258  NA 130* 136 133* 136  K 4.3 4.5 4.5 4.4  CL 86* 89*  --  90*  CO2 20* 22  --  25  GLUCOSE 456* 302*  --  347*  BUN 39* 45*  --  61*  CREATININE 6.84* 7.44*  --  8.64*  CALCIUM 7.4* 7.8*  --  7.5*   Liver Function Tests: Recent Labs  Lab 01/04/2019 2314 01/11/2019 0234 12/29/18 0258  AST 25 23 35  ALT _0 ALKPHOS 47 47 48  BILITOT 1.7* 1.3* 0.7  PROT 6.0* 6.0* 5.9*  ALBUMIN 2.6* 2.7* 2.5*  No results for input(s): LIPASE, AMYLASE in the last 168 hours. No results for input(s): AMMONIA in the last 168 hours. CBC: Recent Labs  Lab 12/22/18 1200 01/03/2019 2045 01/08/2019 0234 12/29/18 0014 12/29/18 0258 12/29/18 0715  WBC 6.6 13.4* 12.7*  --  13.8*  --   NEUTROABS 5.1 12.2* 11.4*  --  12.7*  --   HGB 12.2 11.7* 10.2* 9.9* 9.6* 10.1*  HCT 37.8 37.9 31.5* 29.0* 28.8* 29.2*  MCV 101.1* 102.4* 97.5  --  94.7  --   PLT 97* 156 162  --  162  --    Cardiac Enzymes: No results for input(s): CKTOTAL, CKMB, CKMBINDEX, TROPONINI in the last 168 hours. CBG: Recent Labs  Lab 01/08/2019 1331 01/13/2019 1731 12/29/18 0056 12/29/18 0357 12/29/18 0934  GLUCAP 226* 301* 301* 306* 232*   Iron Studies: No results for input(s): IRON, TIBC, TRANSFERRIN, FERRITIN in the last 72 hours. Studies/Results: Dg Chest Port 1  View  Result Date: 12/29/2018 CLINICAL DATA:  73 year old female COVID-49. Intubated. EXAM: PORTABLE CHEST 1 VIEW COMPARISON:  01/14/2019 portable chest and earlier. FINDINGS: Portable AP semi upright view at 0000 hours. Endotracheal tube tip projects just above the carina, directed toward the right mainstem bronchus. Enteric tube courses to the abdomen, tip not included. The patient is now rotated to the right. Patchy and confluent left lower lung opacity persists. Worsening left lung base ventilation, now obscuring some of the left hemidiaphragm. No pneumothorax. The right lung appears clear. IMPRESSION: 1. Endotracheal tube tip just above the carina directed toward the right mainstem. Retract 1-2 cm for more optimal positioning. 2. Enteric tube courses to the abdomen, tip not included. 3. Worsening left lung ventilation since yesterday, might in part reflect atelectasis due to #1. Electronically Signed   By: Genevie Ann M.D.   On: 12/29/2018 00:19   Dg Chest Port 1 View  Result Date: 01/09/2019 CLINICAL DATA:  73 year old female COVID-19. Atrial fibrillation with RVR. EXAM: PORTABLE CHEST 1 VIEW COMPARISON:  12/22/2018 portable chest and earlier. FINDINGS: Portable AP semi upright view at 1932 hours. Pacer pads project over the left chest but there appears to be increasing streaky and confluent left perihilar and basilar opacity. Stable lung volumes. Stable cardiac size and mediastinal contours. Visualized tracheal air column is within normal limits. There is a chronic linear metal retained foreign body which is posterior to the medial right clavicle and stable from a CT in 2017. Allowing for portable technique the right lung remains clear. No pleural effusion Negative visible bowel gas pattern. Left axillary vascular stent. IMPRESSION: 1. Increasing left lung opacity suspicious for COVID-19 pneumonia. 2. No pleural effusion.  Right lung remains clear. Electronically Signed   By: Genevie Ann M.D.   On: 01/12/2019  20:08    ROS: As per HPI otherwise negative.  Physical Exam: Vitals:   12/29/18 0700 12/29/18 0854 12/29/18 0900 12/29/18 1000  BP: 138/73 119/62 119/62 (!) 89/43  Pulse: 71 65 67 62  Resp: _0 Temp:      TempSrc:      SpO2: 97% 98% 100% 100%  Weight:      Height:         General: Chronically ill appearing female, intubated on vent.  Head: Normocephalic, atraumatic, sclera non-icteric, mucus membranes are moist Neck: Supple. JVD not elevated. Lungs: Orally ET-Tube with bilateral breath sounds, symmetrical chest excursion, decreased in bases otherwise CTAB Heart: RRR with S1 S2. No murmurs, rubs, or gallops appreciated. Abdomen:  Soft, non-tender, non-distended with normoactive bowel sounds. No rebound/guarding. No obvious abdominal masses. M-S:  Strength and tone appear normal for age. Lower extremities:without edema or ischemic changes, no open wounds  Neuro: Awake, MAE X 4, follow simple commands Psych:  Appears slightly agitated Dialysis Access: L thigh AVG + bruit  Dialysis Orders: [facility] T,Th,S 4 hrs 180NRe 450/800 78 kg 2.0 K/2.0 Ca L thigh AVG -Heparin 7000 units IV TIW -Parsabiv 15 mg IV TIW -Calcitriol 0.5 mcg PO TIW   Assessment/Plan: 1.  Acute respiratory failure in setting of COVID 19-intubated on vent. Per PCCM.  2.  L ischemic Leg S/P L femoral and iliac thrombectomy last PM per Dr. Donnetta Hutching.  3.  Afib RVR-currently SR at present. Heparin stopped D/T bleeding post OP. Cardiology following.  4.  ESRD -  T,Th,S HD today if possible. No heparin. K+ 4.4 5.  Hypertension/volume  - Hypotension earlier requiring pressor, now hypertensive. Does not appear to have excess volume by exam. Has been leaving OP center under EDW. 1.5-2 liters with HD today. Lower EDW on DC.   6.  Anemia  - HGB 9.6 down for 11.7 on adm. Bled post OP last night. Aranesp 60 mcg IV with HD today.  7.  Metabolic bone disease -  NPO at present. Resume binder when eating. Parsabiv not  available-hold and resume as OP. Change oral calcitriol to hectorol while NPO.  8.  Nutrition -NPO  9.  DM-per primary 10.  Chronic systolic and diastolic HF-no evidence of volume overload at present. EDW needs to go down D/T loss of body weight.  11. H/O CAD-per cards/primary  Jimmye Norman. Owens Shark, NP-C 12/29/2018, 10:34 AM  [employer] [phone]

## 2018-12-29 NOTE — Progress Notes (Signed)
Inpatient Diabetes Program Recommendations  AACE/ADA: New Consensus Statement on Inpatient Glycemic Control (2015)  Target Ranges:  Prepandial:   less than 140 mg/dL      Peak postprandial:   less than 180 mg/dL (1-2 hours)      Critically ill patients:  140 - 180 mg/dL   Lab Results  Component Value Date   GLUCAP 306 (H) 12/29/2018   HGBA1C 8.7 (H) 12/19/2018    Review of Glycemic Control Results for [name], [name] (MRN [id number]) as of 12/29/2018 09:10  Ref. Range 12/17/2018 17:31 12/29/2018 00:56 12/29/2018 03:57  Glucose-Capillary Latest Ref Range: 70 - 99 mg/dL 301 (H) 301 (H) 306 (H)   Diabetes history: Type 2 DM Outpatient Diabetes medications: Levemir 12 units QHS, Glipizide 10 mg BID Current orders for Inpatient glycemic control: Levemir 12 units QHS, Novolog 0-9 units Q4H Prednisone 40 mg QAM  Inpatient Diabetes Program Recommendations:    Noted patient did not receive Levemir last night due to OR. Trends exceeding 300's mg/dL in the setting of steroids.   Would recommend restarting Levemir 12 units to start this AM.   Thanks, Bronson Curb, MSN, RNC-OB Diabetes Coordinator [phone] (8a-5p)

## 2018-12-29 NOTE — Progress Notes (Addendum)
NAME:  Stefanie [name], MRN:  [id number], DOB:  1945/06/16, LOS: 1 ADMISSION DATE:  01/07/2019, CONSULTATION DATE:  12/25/2018 REFERRING MD:  TRH, CHIEF COMPLAINT:  resp insufficiency   Brief History   13 yoF SNF resident with recent [place] diagnosis presenting with reports hypoxia and found to be in Afib with RVR and CXR with multifocal inflitrates.  Has not had any O2 requirement or respiratory distress.  Admitted by [facility] and started on heparin and cardizem.  On 10/14 found to have acute left ischemic leg taken to OR for emergent thrombectomy and returns to ICU postoperatively on mechanical ventilation.  PCCM consulted for further management.   Past Medical History  ESRD on TTS HD, CVA with residual left sided weakness, DMT2, chronic systolic/ diastolic HF with EF 99991111, CAD SNF resident.  COVID dx 12/20/2018  Significant Hospital Events   10/13 Admit  Consults:  Cards Nephrology Vascular surgery  PCCM  Procedures:  10/14 OR- left femoral/ left iliac thrombectomy  10/14 ETT >>  Significant Diagnostic Tests:   Micro Data:  10/6 SARS CoV 2>> positive   10/13 BCx 2 >>> staph 1/4 bottles. Likely contaminant >>> 10/14 MRSA PCR >>  Antimicrobials:  10/13 azithromycin 10/14 cefazolin 10/13 CTX >>> 10/14 doxycycline >>>  Interim history/subjective:  Comfortable this AM. Fails SBT due to tachypnea and low volumes despite 15/5.   Objective   Blood pressure 138/73, pulse 71, temperature (!) 97.5 F (36.4 C), resp. rate 20, height 5\' 2"  (1.575 m), weight 76.4 kg, SpO2 97 %.    Vent Mode: PRVC FiO2 (%):  [40 %-100 %] 40 % Set Rate:  [12 bmp-18 bmp] 12 bmp Vt Set:  [400 mL] 400 mL PEEP:  [5 cmH20] 5 cmH20 Plateau Pressure:  [18 cmH20-22 cmH20] 19 cmH20   Intake/Output Summary (Last 24 hours) at 12/29/2018 0814 Last data filed at 12/29/2018 0500 Gross per 24 hour  Intake 752.7 ml  Output 100 ml  Net 652.7 ml   Filed Weights   12/26/2018 2210  Weight: 76.4 kg    Examination: From door with assistance of bedside RN in the setting of COVID-19 General:  Frail elderly female in NAD on vent HEENT: [place]/AT, no appreciable JVD.  Neuro: Spontaneously awake, alert, follows commands. Agitated at times. CV: RRR, no MRG PULM:  Clear bilateral breath sounds GI: Soft, non-tender, non-distended Extremities: No acute deformity or ROM limitation.  Skin: no rashes  Resolved Hospital Problem list    Assessment & Plan:   Acute respiratory insufficency related to surgery 10/14. ? Acute exacerbation of COPD. COVID infection - prior to OR was doing well on room air unclear if she has a true COVID pneumonia. This is less favored.  - Full MV support - Failed SBT, hope with HD we can have a better effort tomorrow.  - CXR now - Empiric CAP coverage, prednisone. - VAP bundle - Xopenex/atrovent scheduled.  - Continue precedex, PRN fentanyl for RASS goal 0 to -1.   Left ischemic leg s/p thrombectomy  - Per Vascular - Frequent neurovascular / groin checks  - heparin held due to surgical site oozing. Will restart as oozing has resolved for several hours now.   Hypotension: seems to be sedation related - midodrine to help with HD - If needs sedation can start peripheral levo.   Afib with RVR - currently SR - Cardiology following - heparin - Tele monitoring - hold lopressor, hypotensive. Continue PRN. Restart scheduled when able.   ESRD on HD -  Nephrology following, plan for HD today.  - BMP in am  - Add midodrine to facilitate HD  Chronic Systolic/ diastolic HF HTN - HD as above - Prn apresoline for SBP   DM - CBG q 4 with SSI - Levemir, change to AM and give dose now.    Best practice:  Diet: NPO Pain/Anxiety/Delirium protocol (if indicated): precedex/ prn fentanyl  VAP protocol (if indicated): yes DVT prophylaxis: heparin IV GI prophylaxis: PPI Glucose control: SSI q 4 Mobility: BR Code Status: DNR Family Communication: pending Disposition:  ICU for now  Labs   CBC: Recent Labs  Lab 12/22/18 1200 01/02/2019 2045 12/23/2018 0234 12/29/18 0014 12/29/18 0258 12/29/18 0715  WBC 6.6 13.4* 12.7*  --  13.8*  --   NEUTROABS 5.1 12.2* 11.4*  --  12.7*  --   HGB 12.2 11.7* 10.2* 9.9* 9.6* 10.1*  HCT 37.8 37.9 31.5* 29.0* 28.8* 29.2*  MCV 101.1* 102.4* 97.5  --  94.7  --   PLT 97* 156 162  --  162  --     Basic Metabolic Panel: Recent Labs  Lab 12/22/18 1200 01/03/2019 2314 01/14/2019 0234 12/29/18 0014 12/29/18 0258  NA 139 130* 136 133* 136  K 4.0 4.3 4.5 4.5 4.4  CL 88* 86* 89*  --  90*  CO2 31 20* 22  --  25  GLUCOSE 140* 456* 302*  --  347*  BUN 36* 39* 45*  --  61*  CREATININE 7.85* 6.84* 7.44*  --  8.64*  CALCIUM 8.1* 7.4* 7.8*  --  7.5*   GFR: Estimated Creatinine Clearance: 5.5 mL/min (A) (by C-G formula based on SCr of 8.64 mg/dL (H)). Recent Labs  Lab 12/22/18 1200 12/19/2018 2030 12/30/2018 2045 01/01/2019 2314 12/27/2018 0234 12/29/18 0258  PROCALCITON 9.44  --   --   --   --   --   WBC 6.6  --  13.4*  --  12.7* 13.8*  LATICACIDVEN 1.4 2.8*  --  2.3*  --   --     Liver Function Tests: Recent Labs  Lab 12/22/18 1200 01/06/2019 2314 12/26/2018 0234 12/29/18 0258  AST 36 25 23 35  ALT 19 18 15 18   ALKPHOS 60 47 47 48  BILITOT 1.1 1.7* 1.3* 0.7  PROT 7.0 6.0* 6.0* 5.9*  ALBUMIN 3.5 2.6* 2.7* 2.5*   No results for input(s): LIPASE, AMYLASE in the last 168 hours. No results for input(s): AMMONIA in the last 168 hours.  ABG    Component Value Date/Time   PHART 7.541 (H) 12/29/2018 0014   PCO2ART 32.2 12/29/2018 0014   PO2ART 83.0 12/29/2018 0014   HCO3 27.8 12/29/2018 0014   TCO2 29 12/29/2018 0014   O2SAT 98.0 12/29/2018 0014     Coagulation Profile: No results for input(s): INR, PROTIME in the last 168 hours.  Cardiac Enzymes: No results for input(s): CKTOTAL, CKMB, CKMBINDEX, TROPONINI in the last 168 hours.  HbA1C: Hgb A1c MFr Bld  Date/Time Value Ref Range Status  12/25/2018 02:34 AM  8.7 (H) 4.8 - 5.6 % Final    Comment:    (NOTE) Pre diabetes:          5.7%-6.4% Diabetes:              >6.4% Glycemic control for   <7.0% adults with diabetes   08/01/2017 05:57 AM 7.7 (H) 4.8 - 5.6 % Final    Comment:    (NOTE) Pre diabetes:  5.7%-6.4% Diabetes:              >6.4% Glycemic control for   <7.0% adults with diabetes     CBG: Recent Labs  Lab 12/18/2018 0841 01/10/2019 1331 01/04/2019 1731 12/29/18 0056 12/29/18 0357  GLUCAP 260* 226* 301* 301* 306*    CRITICAL CARE Performed by: Corey Harold   Total critical care time: 55 minutes  Critical care time was exclusive of separately billable procedures and treating other patients.  Critical care was necessary to treat or prevent imminent or life-threatening deterioration.  Critical care was time spent personally by me on the following activities: development of treatment plan with patient and/or surrogate as well as nursing, discussions with consultants, evaluation of patient's response to treatment, examination of patient, obtaining history from patient or surrogate, ordering and performing treatments and interventions, ordering and review of laboratory studies, ordering and review of radiographic studies, pulse oximetry and re-evaluation of patient's condition.   Georgann Housekeeper, AGACNP-BC [place] Pager [phone] or [phone]  12/29/2018 10:59 AM

## 2018-12-29 NOTE — Progress Notes (Signed)
RT Note:  Patient transported from 3M04 to 3M11 without any complications.

## 2018-12-29 NOTE — Anesthesia Postprocedure Evaluation (Signed)
Anesthesia Post Note  Patient: Stefanie [name]  Procedure(s) Performed: LEFT FEMORAL AND ILIAC ARTERY THROMBECTOMY (Left )     Patient location during evaluation: SICU Anesthesia Type: General Level of consciousness: patient cooperative and patient remains intubated per anesthesia plan Pain management: pain level controlled Vital Signs Assessment: post-procedure vital signs reviewed and stable Respiratory status: patient remains intubated per anesthesia plan and patient on ventilator - see flowsheet for VS Cardiovascular status: stable Anesthetic complications: no    Last Vitals:  Vitals:   12/29/18 0600 12/29/18 0700  BP: (!) 69/44 138/73  Pulse: 65 71  Resp: 16 20  Temp:    SpO2: 100% 97%    Last Pain:  Vitals:   01/04/2019 1012  TempSrc:   PainSc: [place]

## 2018-12-29 NOTE — Progress Notes (Signed)
eLink Physician-Brief Progress Note Patient Name: Stefanie [name] DOB: Jun 28, 1945 MRN: [id number]   Date of Service  12/29/2018  HPI/Events of Note  Pt with sub-optimal sedation on low dose Precedex.  eICU Interventions  Fentanyl infusion ordered.        Kerry Kass Ogan 12/29/2018, 5:14 AM

## 2018-12-29 NOTE — Progress Notes (Signed)
Patient bleeding for surgical site. Pressure dressing applied MD notified by charge RN orders to stop heparin.

## 2018-12-29 NOTE — Progress Notes (Signed)
ANTICOAGULATION CONSULT NOTE  Pharmacy Consult for Heparin Indication: atrial fibrillation  No Known Allergies  Patient Measurements: Height: 5\' 2"  (157.5 cm) Weight: 171 lb 8.3 oz (77.8 kg) IBW/kg (Calculated) : 50.1 Heparin Dosing Weight: 70 kg  Vital Signs: Temp: 99.2 F (37.3 C) (10/15 1957) Temp Source: Oral (10/15 1957) BP: 121/47 (10/15 1800) Pulse Rate: 72 (10/15 1957)  Labs: Recent Labs    01/01/2019 2045 01/07/2019 2314 01/05/2019 0234 01/03/2019 0502 01/14/2019 0857 12/30/2018 1700 01/13/2019 2259 12/29/18 0014 12/29/18 0258 12/29/18 0715 12/29/18 2046  HGB 11.7*  --  10.2*  --   --   --   --  9.9* 9.6* 10.1*  --   HCT 37.9  --  31.5*  --   --   --   --  29.0* 28.8* 29.2*  --   PLT 156  --  162  --   --   --   --   --  162  --   --   HEPARINUNFRC  --   --   --   --  0.67  --   --   --  1.08*  --  0.39  CREATININE  --  6.84* 7.44*  --   --   --   --   --  8.64*  --   --   TROPONINIHS 222* 318* 755* 764*  --  834* 682*  --   --   --   --     Estimated Creatinine Clearance: 5.6 mL/min (A) (by C-G formula based on SCr of 8.64 mg/dL (H)).  Assessment: 73 y.o. female started on IV heparin for new afib. Noted COVID+ from 10/6. D-dimer elevated at 3.5. Initial heparin level was therapeutic; however, heparin was stopped overnight with oozing from surgical site at 0030 per RN report (not charted until 0330). Oozing has resolved per Surgery and RN reports no further issues.   Hep lvl within goal  Goal of Therapy:  Heparin level 0.3-0.7 units/ml Monitor platelets by anticoagulation protocol: Yes   Plan:  heparin IV at 950 units/hr  Monitor daily heparin level and CBC, s/sx bleeding  Levester Fresh, PharmD, BCPS, BCCCP Clinical Pharmacist [phone]  Please check AMION for all [place] numbers  12/29/2018 9:35 PM

## 2018-12-29 NOTE — Progress Notes (Signed)
Initial Nutrition Assessment   RD working remotely.   DOCUMENTATION CODES:   Not applicable  INTERVENTION:   Tube Feeding:  Vital High Protein at 45 ml/hr Pro-Stat 30 mL TID Provides 1380 kcals, 140 g of protein and 907 mL of free water  Recommend holding phosphorus binders (PhosLo for now due to hypophosphatemia with phosphorus <2.0)  Add B-complex with C; change to Rena-Vit once able to take PO  NUTRITION DIAGNOSIS:   Inadequate oral intake related to acute illness as evidenced by NPO status.  GOAL:   Patient will meet greater than or equal to 90% of their needs  MONITOR:   Vent status, TF tolerance, Labs, Weight trends  REASON FOR ASSESSMENT:   Ventilator, Consult Enteral/tube feeding initiation and management  ASSESSMENT:   73 yo female admitted with acute respiratory failure with COVID+ pneumonia, COPD; ischemic LLE. PMH includes ESRD on HD, CHF, CVA, DM, HLD, HTN  10/14 L. Femoral and L. Iliac thrombectomy  Pt receiving HD today Patient is currently intubated on ventilator support, failed weaning trial this AM MV: 8.9 L/min Temp (24hrs), Avg:97.5 F (36.4 C), Min:97.5 F (36.4 C), Max:97.5 F (36.4 C)  EDW 78 kg per OP records, current wt 76.4 kg  Unable to obtain diet and weight history from pt at this time  Phosphorus 1.3; noted PhosLo 2001 mg TID with meals and 667 mg prn with snacks ordered. Recommend discontinuing for now  Labs: CBGs 197-306, phosphorus 1.3 (L), magnesium 0.6 (L) Meds: PhosLo, sensipar, calcitriol, prednisone, mag sulfate, sodium phosphate  NUTRITION - FOCUSED PHYSICAL EXAM:  Unable to perform  Diet Order:   Diet Order            Diet NPO time specified  Diet effective now              EDUCATION NEEDS:   Not appropriate for education at this time  Skin:  Skin Assessment: Reviewed RN Assessment  Last BM:  10/15  Height:   Ht Readings from Last 1 Encounters:  01/06/2019 5\' 2"  (1.575 m)    Weight:   Wt  Readings from Last 1 Encounters:  01/07/2019 76.4 kg    Ideal Body Weight:     BMI:  Body mass index is 30.81 kg/m.  Estimated Nutritional Needs:   Kcal:  1321 kcals  Protein:  114-152 g  Fluid:  1000 mL plus UOP    [employer] MS, RDN, LDN, CNSC [phone] Pager  [phone] Weekend/On-Call Pager

## 2018-12-29 NOTE — Progress Notes (Signed)
Maintaining sinus rhythm at controlled rate.

## 2018-12-29 NOTE — Progress Notes (Signed)
Note brief NSVT during HD today, in setting of severe hypomagnesemia. Otherwise maintaining NSR, no recurrent AFib. On amiodarone and metoprolol, without bradycardia so far. On ventilator since iliofemoral thrombectomy.  Sanda Klein, MD, [facility] [phone] office [phone] pager

## 2018-12-29 NOTE — Progress Notes (Signed)
Patient ID: Stefanie [name], female   DOB: 02-25-1946, 73 y.o.   MRN: [id number]  Progress Note    12/29/2018 7:46 AM 1 Day Post-Op  Subjective: Stable on the vent.  Moving all 4 extremities.  Able to follow commands and reports sensory function in her left foot.  Moving her left foot without difficulty   Vitals:   12/29/18 0600 12/29/18 0700  BP: (!) 69/44 138/73  Pulse: 65 71  Resp: 16 20  Temp:    SpO2: 100% 97%   Physical Exam: Left groin without hematoma.  Pulse in her AV Gore-Tex graft  CBC    Component Value Date/Time   WBC 13.8 (H) 12/29/2018 0258   RBC 3.04 (L) 12/29/2018 0258   HGB 9.6 (L) 12/29/2018 0258   HCT 28.8 (L) 12/29/2018 0258   PLT 162 12/29/2018 0258   MCV 94.7 12/29/2018 0258   MCH 31.6 12/29/2018 0258   MCHC 33.3 12/29/2018 0258   RDW 14.5 12/29/2018 0258   LYMPHSABS 0.5 (L) 12/29/2018 0258   MONOABS 0.2 12/29/2018 0258   EOSABS 0.0 12/29/2018 0258   BASOSABS 0.0 12/29/2018 0258    BMET    Component Value Date/Time   NA 136 12/29/2018 0258   K 4.4 12/29/2018 0258   CL 90 (L) 12/29/2018 0258   CO2 25 12/29/2018 0258   GLUCOSE 347 (H) 12/29/2018 0258   BUN 61 (H) 12/29/2018 0258   CREATININE 8.64 (H) 12/29/2018 0258   CALCIUM 7.5 (L) 12/29/2018 0258   GFRNONAA 4 (L) 12/29/2018 0258   GFRAA 5 (L) 12/29/2018 0258    INR    Component Value Date/Time   INR 1.06 06/18/2016 0717     Intake/Output Summary (Last 24 hours) at 12/29/2018 0746 Last data filed at 12/29/2018 0500 Gross per 24 hour  Intake 752.7 ml  Output 100 ml  Net 652.7 ml     Assessment/Plan:  73 y.o. female stable status post left femoral and iliac embolectomy.  Had some oozing but now stopped with her heparin discontinued.  Plan per medical team     Rosetta Posner, MD FACS [facility] [phone] 12/29/2018 7:46 AM

## 2018-12-29 NOTE — Progress Notes (Addendum)
PCCM INTERVAL PROGRESS NOTE   Contacted by RN re: BMP result.  BMP Latest Ref Rng & Units 12/29/2018 12/29/2018 01/14/2019  Glucose 70 - 99 mg/dL 347(H) - 302(H)  BUN 8 - 23 mg/dL 61(H) - 45(H)  Creatinine 0.44 - 1.00 mg/dL 8.64(H) - 7.44(H)  Sodium 135 - 145 mmol/L 136 133(L) 136  Potassium 3.5 - 5.1 mmol/L 4.4 4.5 4.5  Chloride 98 - 111 mmol/L 90(L) - 89(L)  CO2 22 - 32 mmol/L 25 - 22  Calcium 8.9 - 10.3 mg/dL 7.5(L) - 7.8(L)    Mag 0.6 Phos 1.3  Replace mag sulfate 4g  Replace sodium phos 69mmol DC phoslo Repeat this evening   Georgann Housekeeper, AGACNP-BC [place] Pager [phone] or [phone]  12/29/2018 1:54 PM

## 2018-12-29 NOTE — Progress Notes (Signed)
[facility] PCCM PROGRESS NOTE  Called by bedside RN after patient has 10 beat run of VT. She is currently undergoing HD and is about halfway through. Mag and phos as outlined in prior note. Undergoing replacement currently. I have contacted nephrology APP who instructs me to stop HD. I have passed this information off to bedside RN.   Georgann Housekeeper, ACNP [facility] Pulmonology/Critical Care Pager [phone] or [phone]

## 2018-12-29 NOTE — Progress Notes (Addendum)
PHARMACY - PHYSICIAN COMMUNICATION CRITICAL VALUE ALERT - BLOOD CULTURE IDENTIFICATION (BCID)  Stefanie [name] is an 73 y.o. female who presented to [facility] on 12/26/2018 with a chief complaint of SOB  Addum:  1/4 BC with g variable rods.  No changes for now.  D/w Oletta Darter Assessment:  1/4 BC Staph species  Name of physician (or Provider) Contacted: Tylene Fantasia  Current antibiotics:rocephin and azithro  Changes to prescribed antibiotics recommended:  None  Results for orders placed or performed during the hospital encounter of 01/09/2019  Blood Culture ID Panel (Reflexed) (Collected: 12/30/2018  8:35 PM)  Result Value Ref Range   Enterococcus species NOT DETECTED NOT DETECTED   Listeria monocytogenes NOT DETECTED NOT DETECTED   Staphylococcus species DETECTED (A) NOT DETECTED   Staphylococcus aureus (BCID) NOT DETECTED NOT DETECTED   Methicillin resistance NOT DETECTED NOT DETECTED   Streptococcus species NOT DETECTED NOT DETECTED   Streptococcus agalactiae NOT DETECTED NOT DETECTED   Streptococcus pneumoniae NOT DETECTED NOT DETECTED   Streptococcus pyogenes NOT DETECTED NOT DETECTED   Acinetobacter baumannii NOT DETECTED NOT DETECTED   Enterobacteriaceae species NOT DETECTED NOT DETECTED   Enterobacter cloacae complex NOT DETECTED NOT DETECTED   Escherichia coli NOT DETECTED NOT DETECTED   Klebsiella oxytoca NOT DETECTED NOT DETECTED   Klebsiella pneumoniae NOT DETECTED NOT DETECTED   Proteus species NOT DETECTED NOT DETECTED   Serratia marcescens NOT DETECTED NOT DETECTED   Haemophilus influenzae NOT DETECTED NOT DETECTED   Neisseria meningitidis NOT DETECTED NOT DETECTED   Pseudomonas aeruginosa NOT DETECTED NOT DETECTED   Candida albicans NOT DETECTED NOT DETECTED   Candida glabrata NOT DETECTED NOT DETECTED   Candida krusei NOT DETECTED NOT DETECTED   Candida parapsilosis NOT DETECTED NOT DETECTED   Candida tropicalis NOT DETECTED NOT DETECTED    Beverlee Nims 12/29/2018  12:39 AM

## 2018-12-29 NOTE — Progress Notes (Signed)
ANTICOAGULATION CONSULT NOTE  Pharmacy Consult for Heparin Indication: atrial fibrillation  No Known Allergies  Patient Measurements: Height: 5\' 2"  (157.5 cm) Weight: 168 lb 6.9 oz (76.4 kg) IBW/kg (Calculated) : 50.1 Heparin Dosing Weight: 70 kg  Vital Signs: Temp: 97.5 F (36.4 C) (10/14 2210) BP: 138/73 (10/15 0700) Pulse Rate: 71 (10/15 0700)  Labs: Recent Labs    01/11/2019 2045 12/17/2018 2314 12/21/2018 0234 01/04/2019 0502 01/06/2019 0857 01/09/2019 1700 12/24/2018 2259 12/29/18 0014 12/29/18 0258 12/29/18 0715  HGB 11.7*  --  10.2*  --   --   --   --  9.9* 9.6* 10.1*  HCT 37.9  --  31.5*  --   --   --   --  29.0* 28.8* 29.2*  PLT 156  --  162  --   --   --   --   --  162  --   HEPARINUNFRC  --   --   --   --  0.67  --   --   --  1.08*  --   CREATININE  --  6.84* 7.44*  --   --   --   --   --  8.64*  --   TROPONINIHS 222* 318* 755* 764*  --  834* 682*  --   --   --     Estimated Creatinine Clearance: 5.5 mL/min (A) (by C-G formula based on SCr of 8.64 mg/dL (H)).  Assessment: 73 y.o. female started on IV heparin for new afib. Noted COVID+ from 10/6. D-dimer elevated at 3.5. Initial heparin level was therapeutic; however, heparin was stopped overnight with oozing from surgical site at 0030 per RN report (not charted until 0330). Oozing has resolved per Surgery and RN reports no further issues. Per discussion with CCM, will resume heparin at 12 hours after infusion was stopped at 1230 today. F/u heparin level was high, but overnight RN was unsure where lab was drawn from. Will resume heparin at lower rate due to uncertainty if this was a true level and previous oozing issues. CBC stable.  Goal of Therapy:  Heparin level 0.3-0.7 units/ml Monitor platelets by anticoagulation protocol: Yes   Plan:  Resume heparin IV at 950 units/hr at 1230 per CCM 8h heparin level from resumption Monitor daily heparin level and CBC, s/sx bleeding   Elicia Lamp, PharmD, BCPS Please check  AMION for all [place] contact numbers Clinical Pharmacist 12/29/2018 8:25 AM

## 2018-12-29 NOTE — TOC Initial Note (Signed)
Transition of Care [facility]) - Initial/Assessment Note    Patient Details  Name: Stefanie [name] MRN: [id number] Date of Birth: [birth year]  Transition of Care [facility]) CM/SW Contact:    Carles Collet, RN Phone Number: 12/29/2018, 2:31 PM  Clinical Narrative:           Admitted from [facility] 10/14 Vascular surgery 10/15 remains intubated, inability to wean, COVID+         Expected Discharge Plan: [place] Barriers to Discharge: Continued Medical Work up   Patient Goals and CMS Choice        Expected Discharge Plan and Services Expected Discharge Plan: [place]                                              Prior Living Arrangements/Services                       Activities of Daily Living      Permission Sought/Granted                  Emotional Assessment              Admission diagnosis:  Atrial fibrillation with RVR ([place]) [I48.91] Hypotension, unspecified hypotension type [I95.9] Sepsis, due to unspecified organism, unspecified whether acute organ dysfunction present ([place]) [A41.9] Pneumonia due to COVID-19 virus [U07.1, J12.89] COVID-19 [U07.1] Patient Active Problem List   Diagnosis Date Noted  . COVID-19   . Hypotension   . Acute hypoxemic respiratory failure ([place])   . Atrial fibrillation with RVR ([place]) 01/02/2019  . COVID-19 virus infection   . Nausea & vomiting 07/30/2017  . Acute blood loss anemia 02/09/2016  . Hypotension due to blood loss 02/09/2016  . Chronic combined systolic and diastolic CHF (congestive heart failure) ([place]) 02/09/2016  . Hemorrhage 02/08/2016  . Anemia 02/08/2016  . Left leg pain 04/10/2015  . Bacteremia 04/10/2015  . Lactic acidosis   . Cellulitis 04/09/2015  . History of CVA with residual deficit 02/11/2015  . Weakness generalized 02/08/2015  . DVT (deep venous thrombosis) ([place])   . CHF (congestive heart failure) ([place])   . Chronic diastolic congestive heart  failure ([place])   . ESRD (end stage renal disease) ([place])   . Congestive dilated cardiomyopathy ([place])   . Left leg DVT ([place]) 02/01/2015  . Leukocytosis 02/01/2015  . Elevated troponin 02/01/2015  . SOB (shortness of breath) 02/01/2015  . Type 2 diabetes, controlled, with neuropathy ([place]) 12/20/2014  . Pain due to onychomycosis of toenail 12/20/2014  . 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 Mechanical complication of other vascular device, implant, and graft 05/19/2013  . PAD (peripheral artery disease) ([place]) 02/24/2012  . Murmur 12/24/2011  . Bradycardia-intermittent sinus 07/10/2010  . DYSPNEA ON EXERTION 02/25/2010  . CHEST PAIN, EXERTIONAL 02/25/2010  . DM 02/24/2010  . PROTEIN MALNUTRITION 02/24/2010  . HLD (hyperlipidemia) 02/24/2010  . ANEMIA 02/24/2010  . Essential hypertension 02/24/2010  . Cerebral artery occlusion with cerebral infarction ([place]) 02/24/2010  . ESRD on dialysis ([place]) 02/24/2010  . 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

## 2018-12-29 NOTE — Progress Notes (Signed)
Patient continues to bleed from surgical site ADS dressing saturated with bright red blood dressing changed pressure applied sand bag applied.. Dr Early notified. No orders given continue  pressure at the site and monitor will wait for CBC results.

## 2018-12-29 NOTE — Procedures (Signed)
Was called by CCM PA this afternoon.  Patient was around 2 hours into HD - 400 BFR via thigh AVG - found to have a 10 beat run of VT and soft BP.  Of note pre HD phos was 1.3 and Mag was 0.6-  Was on IV repletion of both for recheck at 5 PM.  I think OK to stop HD for now.  Will follow up on those labs and reassess in AM to see if further HD is needed tomorrow.  Patient is alert on the vent at this time   Louis Meckel

## 2018-12-30 ENCOUNTER — Inpatient Hospital Stay (HOSPITAL_COMMUNITY): Payer: Medicare Other

## 2018-12-30 DIAGNOSIS — U071 COVID-19: Secondary | ICD-10-CM

## 2018-12-30 DIAGNOSIS — I5042 Chronic combined systolic (congestive) and diastolic (congestive) heart failure: Secondary | ICD-10-CM

## 2018-12-30 LAB — CBC WITH DIFFERENTIAL/PLATELET
Abs Immature Granulocytes: 0.21 10*3/uL — ABNORMAL HIGH (ref 0.00–0.07)
Basophils Absolute: 0 10*3/uL (ref 0.0–0.1)
Basophils Relative: 0 %
Eosinophils Absolute: 0 10*3/uL (ref 0.0–0.5)
Eosinophils Relative: 0 %
HCT: 22 % — ABNORMAL LOW (ref 36.0–46.0)
Hemoglobin: 7.1 g/dL — ABNORMAL LOW (ref 12.0–15.0)
Immature Granulocytes: 2 %
Lymphocytes Relative: 9 %
Lymphs Abs: 1.1 10*3/uL (ref 0.7–4.0)
MCH: 31.6 pg (ref 26.0–34.0)
MCHC: 32.3 g/dL (ref 30.0–36.0)
MCV: 97.8 fL (ref 80.0–100.0)
Monocytes Absolute: 0.3 10*3/uL (ref 0.1–1.0)
Monocytes Relative: 3 %
Neutro Abs: 9.9 10*3/uL — ABNORMAL HIGH (ref 1.7–7.7)
Neutrophils Relative %: 86 %
Platelets: 143 10*3/uL — ABNORMAL LOW (ref 150–400)
RBC: 2.25 MIL/uL — ABNORMAL LOW (ref 3.87–5.11)
RDW: 15 % (ref 11.5–15.5)
WBC: 11.5 10*3/uL — ABNORMAL HIGH (ref 4.0–10.5)
nRBC: 0 % (ref 0.0–0.2)

## 2018-12-30 LAB — MAGNESIUM
Magnesium: 2.6 mg/dL — ABNORMAL HIGH (ref 1.7–2.4)
Magnesium: 2.6 mg/dL — ABNORMAL HIGH (ref 1.7–2.4)

## 2018-12-30 LAB — COMPREHENSIVE METABOLIC PANEL
ALT: 8 U/L (ref 0–44)
AST: 37 U/L (ref 15–41)
Albumin: 2.3 g/dL — ABNORMAL LOW (ref 3.5–5.0)
Alkaline Phosphatase: 47 U/L (ref 38–126)
Anion gap: 17 — ABNORMAL HIGH (ref 5–15)
BUN: 54 mg/dL — ABNORMAL HIGH (ref 8–23)
CO2: 25 mmol/L (ref 22–32)
Calcium: 7 mg/dL — ABNORMAL LOW (ref 8.9–10.3)
Chloride: 93 mmol/L — ABNORMAL LOW (ref 98–111)
Creatinine, Ser: 7.42 mg/dL — ABNORMAL HIGH (ref 0.44–1.00)
GFR calc Af Amer: 6 mL/min — ABNORMAL LOW (ref >60)
GFR calc non Af Amer: 5 mL/min — ABNORMAL LOW (ref >60)
Glucose, Bld: 271 mg/dL — ABNORMAL HIGH (ref 70–99)
Potassium: 3.8 mmol/L (ref 3.5–5.1)
Sodium: 135 mmol/L (ref 135–145)
Total Bilirubin: 0.5 mg/dL (ref 0.3–1.2)
Total Protein: 5 g/dL — ABNORMAL LOW (ref 6.5–8.1)

## 2018-12-30 LAB — CBC
HCT: 22.7 % — ABNORMAL LOW (ref 36.0–46.0)
Hemoglobin: 7.3 g/dL — ABNORMAL LOW (ref 12.0–15.0)
MCH: 32 pg (ref 26.0–34.0)
MCHC: 32.2 g/dL (ref 30.0–36.0)
MCV: 99.6 fL (ref 80.0–100.0)
Platelets: 147 10*3/uL — ABNORMAL LOW (ref 150–400)
RBC: 2.28 MIL/uL — ABNORMAL LOW (ref 3.87–5.11)
RDW: 14.8 % (ref 11.5–15.5)
WBC: 11.7 10*3/uL — ABNORMAL HIGH (ref 4.0–10.5)
nRBC: 0.2 % (ref 0.0–0.2)

## 2018-12-30 LAB — C-REACTIVE PROTEIN: CRP: 5.1 mg/dL — ABNORMAL HIGH (ref <1.0)

## 2018-12-30 LAB — GLUCOSE, CAPILLARY
Glucose-Capillary: 200 mg/dL — ABNORMAL HIGH (ref 70–99)
Glucose-Capillary: 217 mg/dL — ABNORMAL HIGH (ref 70–99)
Glucose-Capillary: 266 mg/dL — ABNORMAL HIGH (ref 70–99)
Glucose-Capillary: 270 mg/dL — ABNORMAL HIGH (ref 70–99)
Glucose-Capillary: 290 mg/dL — ABNORMAL HIGH (ref 70–99)

## 2018-12-30 LAB — LACTATE DEHYDROGENASE: LDH: 358 U/L — ABNORMAL HIGH (ref 98–192)

## 2018-12-30 LAB — PHOSPHORUS
Phosphorus: 4 mg/dL (ref 2.5–4.6)
Phosphorus: 4.8 mg/dL — ABNORMAL HIGH (ref 2.5–4.6)

## 2018-12-30 LAB — D-DIMER, QUANTITATIVE: D-Dimer, Quant: 2 ug/mL-FEU — ABNORMAL HIGH (ref 0.00–0.50)

## 2018-12-30 LAB — HEPARIN LEVEL (UNFRACTIONATED): Heparin Unfractionated: 0.5 IU/mL (ref 0.30–0.70)

## 2018-12-30 MED ORDER — WHITE PETROLATUM EX OINT
TOPICAL_OINTMENT | CUTANEOUS | Status: AC
  Administered 2018-12-30: 17:00:00
  Filled 2018-12-30: qty 28.35

## 2018-12-30 MED ORDER — POTASSIUM CHLORIDE 20 MEQ/15ML (10%) PO SOLN
40.0000 meq | Freq: Once | ORAL | Status: AC
  Administered 2018-12-30: 12:00:00 40 meq
  Filled 2018-12-30: qty 30

## 2018-12-30 MED ORDER — INSULIN REGULAR(HUMAN) IN NACL 100-0.9 UT/100ML-% IV SOLN
INTRAVENOUS | Status: AC
  Administered 2018-12-30: 3.7 [IU]/h via INTRAVENOUS
  Filled 2018-12-30 (×2): qty 100

## 2018-12-30 MED ORDER — CHLORHEXIDINE GLUCONATE CLOTH 2 % EX PADS
6.0000 | MEDICATED_PAD | Freq: Every day | CUTANEOUS | Status: DC
  Administered 2018-12-30 – 2018-12-31 (×2): 6 via TOPICAL

## 2018-12-30 NOTE — Progress Notes (Signed)
[facility] ROUNDING NOTE   Subjective:   This is a 73 year old lady with a history of end-stage renal disease Tuesday Thursday Saturday dialysis [facility].  She has past medical history of hypertension diabetes coronary artery disease with combined systolic and diastolic dysfunction.  She has a history of atrial fibrillation but not on anticoagulation.  She has a history of CVA with left-sided weakness secondary hyperparathyroidism she is positive for Covid 19.  She was having dialysis at [facility].  She presented to the emergency room on 12/17/2018 after having been sent home from hemodialysis with low oxygen saturation subsequently she was sent from SNF for ED evaluation.  She was noted to be in atrial fibrillation with rapid ventricular rate and cardiac converted back to sinus rhythm cardiology been consulted.  During her dialysis treatment on 12/29/2018 she developed episode of nonsustained ventricular tachyarrhythmia about 10 beats dialysis treatment was terminated early receiving only 2 hours of hemodialysis.  Appreciate assistance from Dr. Sallyanne Kuster who felt that this episode was secondary to hypomagnesemia.  She continues to be intubated status post iliofemoral thrombectomy for critical limb ischemia 01/06/2019.  Appreciate assistance from Dr. Donnetta Hutching.  Blood pressure 115/47 pulse 61 temperature 99.3 O2 sats 100% on FiO2 40.  Sodium 135 potassium 3.8 chloride 93 CO2 25 BUN 54 creatinine 7.42 glucose 271 phosphorus 4.0 magnesium 2.6 albumin 2.3 liver enzymes within normal range.  WBC 11.7 hemoglobin 7.3 platelets 147  Amiodarone 200 mg twice daily, cinacalcet 30 mg Tuesday Thursday Saturday, Hectorol 3 mcg Tuesday Thursday Saturday, doxycycline 100 mg twice daily, insulin sliding scale, Levemir 12 units, metoprolol 25 mg twice daily midodrine 10 mg 3 times daily, Protonix 40 mg daily pravastatin 20 mg a day prednisone 40 mg daily, Requip 1 mg  nightly.  Chest x-ray showed patchy bilateral pulmonary opacities cardiomegaly without edema 12/30/2018  Objective:  Vital signs in last 24 hours:  Temp:  [98.7 F (37.1 C)-99.9 F (37.7 C)] 99.3 F (37.4 C) (10/16 0800) Pulse Rate:  [58-107] 61 (10/16 0800) Resp:  [0-30] 19 (10/16 0800) BP: (71-162)/(35-75) 108/47 (10/16 0745) SpO2:  [80 %-100 %] 100 % (10/16 0815) FiO2 (%):  [40 %] 40 % (10/16 0815) Weight:  [77.8 kg-79.1 kg] 79.1 kg (10/16 0217)  Weight change: 1.4 kg Filed Weights   12/29/18 1400 12/29/18 1553 12/30/18 0217  Weight: 77.8 kg 77.8 kg 79.1 kg    Intake/Output: I/O last 3 completed shifts: In: 1839.5 [I.V.:1131.5; NG/GT:665; IV JHERDEYCX:44] Out: 115 [Other:15; Blood:100]   Intake/Output this shift:  No intake/output data recorded.  General: Chronically ill appearing female, intubated on vent.  Head: Normocephalic, atraumatic, sclera non-icteric, mucus membranes are moist Neck: Supple. JVD not elevated. Lungs: Orally ET-Tube with bilateral breath sounds, symmetrical chest excursion, decreased in bases otherwise CTAB Heart: RRR with S1 S2. No murmurs, rubs, or gallops appreciated. Abdomen: Soft, non-tender, non-distended with normoactive bowel sounds. No rebound/guarding. No obvious abdominal masses. M-S:  Strength and tone appear normal for age. Lower extremities:without edema or ischemic changes, no open wounds  Neuro: Awake, MAE X 4, follow simple commands Psych:  Appears slightly agitated Dialysis Access: L thigh AVG + bruit   Basic Metabolic Panel: Recent Labs  Lab 12/18/2018 2314 01/05/2019 0234 12/29/18 0014 12/29/18 0258 12/29/18 0715 12/29/18 2046 12/30/18 0439  NA 130* 136 133* 136  --   --  135  K 4.3 4.5 4.5 4.4  --   --  3.8  CL 86* 89*  --  90*  --   --  93*  CO2 20* 22  --  25  --   --  25  GLUCOSE 456* 302*  --  347*  --   --  271*  BUN 39* 45*  --  61*  --   --  54*  CREATININE 6.84* 7.44*  --  8.64*  --   --  7.42*  CALCIUM  7.4* 7.8*  --  7.5*  --   --  7.0*  MG  --   --   --   --  0.6* 2.6* 2.6*  PHOS  --   --   --   --  1.3* 3.4 4.0    Liver Function Tests: Recent Labs  Lab 12/19/2018 2314 01/09/2019 0234 12/29/18 0258 12/30/18 0439  AST 25 23 35 37  ALT '18 15 18 8  ' ALKPHOS 47 47 48 47  BILITOT 1.7* 1.3* 0.7 0.5  PROT 6.0* 6.0* 5.9* 5.0*  ALBUMIN 2.6* 2.7* 2.5* 2.3*   No results for input(s): LIPASE, AMYLASE in the last 168 hours. No results for input(s): AMMONIA in the last 168 hours.  CBC: Recent Labs  Lab 12/15/2018 2045 01/08/2019 0234 12/29/18 0014 12/29/18 0258 12/29/18 0715 12/30/18 0439 12/30/18 0731  WBC 13.4* 12.7*  --  13.8*  --  11.5* 11.7*  NEUTROABS 12.2* 11.4*  --  12.7*  --  9.9*  --   HGB 11.7* 10.2* 9.9* 9.6* 10.1* 7.1* 7.3*  HCT 37.9 31.5* 29.0* 28.8* 29.2* 22.0* 22.7*  MCV 102.4* 97.5  --  94.7  --  97.8 99.6  PLT 156 162  --  162  --  143* 147*    Cardiac Enzymes: No results for input(s): CKTOTAL, CKMB, CKMBINDEX, TROPONINI in the last 168 hours.  BNP: Invalid input(s): POCBNP  CBG: Recent Labs  Lab 12/29/18 1638 12/29/18 1937 12/30/18 0037 12/30/18 0417 12/30/18 0749  GLUCAP 160* 191* 270* 58* 29*    Microbiology: Results for orders placed or performed during the hospital encounter of 12/15/2018  Blood Culture (routine x 2)     Status: Abnormal   Collection Time: 01/06/2019  8:35 PM   Specimen: BLOOD LEFT HAND  Result Value Ref Range Status   Specimen Description BLOOD LEFT HAND  Final   Special Requests   Final    BOTTLES DRAWN AEROBIC AND ANAEROBIC Blood Culture results may not be optimal due to an inadequate volume of blood received in culture bottles   Culture  Setup Time   Final    AEROBIC BOTTLE ONLY GRAM POSITIVE COCCI CRITICAL RESULT CALLED TO, READ BACK BY AND VERIFIED WITH: L SEAY PHARMD 12/29/18 0032 JDW    Culture (A)  Final    STAPHYLOCOCCUS SPECIES (COAGULASE NEGATIVE) THE SIGNIFICANCE OF ISOLATING THIS ORGANISM FROM A SINGLE SET OF  BLOOD CULTURES WHEN MULTIPLE SETS ARE DRAWN IS UNCERTAIN. PLEASE NOTIFY THE MICROBIOLOGY DEPARTMENT WITHIN ONE WEEK IF SPECIATION AND SENSITIVITIES ARE REQUIRED. Performed at [facility], [place] [address] [place], [place] [address]    Report Status 12/29/2018 FINAL  Final  Blood Culture ID Panel (Reflexed)     Status: Abnormal   Collection Time: 01/06/2019  8:35 PM  Result Value Ref Range Status   Enterococcus species NOT DETECTED NOT DETECTED Final   Listeria monocytogenes NOT DETECTED NOT DETECTED Final   Staphylococcus species DETECTED (A) NOT DETECTED Final    Comment: Methicillin (oxacillin) susceptible coagulase negative staphylococcus. Possible blood culture contaminant (unless isolated from more than  one blood culture draw or clinical case suggests pathogenicity). No antibiotic treatment is indicated for blood  culture contaminants. CRITICAL RESULT CALLED TO, READ BACK BY AND VERIFIED WITH: L SEAY PHARMD 12/29/18 0032 JDW    Staphylococcus aureus (BCID) NOT DETECTED NOT DETECTED Final   Methicillin resistance NOT DETECTED NOT DETECTED Final   Streptococcus species NOT DETECTED NOT DETECTED Final   Streptococcus agalactiae NOT DETECTED NOT DETECTED Final   Streptococcus pneumoniae NOT DETECTED NOT DETECTED Final   Streptococcus pyogenes NOT DETECTED NOT DETECTED Final   Acinetobacter baumannii NOT DETECTED NOT DETECTED Final   Enterobacteriaceae species NOT DETECTED NOT DETECTED Final   Enterobacter cloacae complex NOT DETECTED NOT DETECTED Final   Escherichia coli NOT DETECTED NOT DETECTED Final   Klebsiella oxytoca NOT DETECTED NOT DETECTED Final   Klebsiella pneumoniae NOT DETECTED NOT DETECTED Final   Proteus species NOT DETECTED NOT DETECTED Final   Serratia marcescens NOT DETECTED NOT DETECTED Final   Haemophilus influenzae NOT DETECTED NOT DETECTED Final   Neisseria meningitidis NOT DETECTED NOT DETECTED Final   Pseudomonas aeruginosa NOT DETECTED NOT DETECTED Final    Candida albicans NOT DETECTED NOT DETECTED Final   Candida glabrata NOT DETECTED NOT DETECTED Final   Candida krusei NOT DETECTED NOT DETECTED Final   Candida parapsilosis NOT DETECTED NOT DETECTED Final   Candida tropicalis NOT DETECTED NOT DETECTED Final    Comment: Performed at [facility], [place]. [address], [place], [place] [address]  Blood Culture (routine x 2)     Status: None (Preliminary result)   Collection Time: 12/19/2018 11:12 PM   Specimen: BLOOD RIGHT FOREARM  Result Value Ref Range Status   Specimen Description BLOOD RIGHT FOREARM  Final   Special Requests   Final    BOTTLES DRAWN AEROBIC AND ANAEROBIC Blood Culture adequate volume   Culture   Final    NO GROWTH 1 DAY Performed at [facility], [place] [address], [place], [place] [address]    Report Status PENDING  Incomplete  MRSA PCR Screening     Status: None   Collection Time: 01/12/2019 10:29 PM   Specimen: Nasopharyngeal  Result Value Ref Range Status   MRSA by PCR NEGATIVE NEGATIVE Final    Comment:        The GeneXpert MRSA Assay (FDA approved for NASAL specimens only), is one component of a comprehensive MRSA colonization surveillance program. It is not intended to diagnose MRSA infection nor to guide or monitor treatment for MRSA infections. Performed at [facility], [place] [address] [place], [place] [address]     Coagulation Studies: No results for input(s): LABPROT, INR in the last 72 hours.  Urinalysis: No results for input(s): COLORURINE, LABSPEC, PHURINE, GLUCOSEU, HGBUR, BILIRUBINUR, KETONESUR, PROTEINUR, UROBILINOGEN, NITRITE, LEUKOCYTESUR in the last 72 hours.  Invalid input(s): APPERANCEUR    Imaging: Dg Chest Port 1 View  Result Date: 12/30/2018 CLINICAL DATA:  Hypoxia EXAM: PORTABLE CHEST 1 VIEW COMPARISON:  Yesterday FINDINGS: Endotracheal tube tip is just below the clavicular heads. The orogastric tube reaches the stomach. Cardiomegaly. Indistinct density at the lung  bases is new or progressed from prior. No effusion or pneumothorax. Stable linear metallic density over the right thoracic inlet. IMPRESSION: 1. Stable hardware positioning. 2. Patchy bilateral pulmonary opacity which could reflect pneumonia or atelectasis, progressed. 3. Cardiomegaly without edema. Electronically Signed   By: Monte Fantasia M.D.   On: 12/30/2018 06:59   Dg Chest [facility] 1 View  Result  Date: 12/29/2018 CLINICAL DATA:  Hypoxia. EXAM: PORTABLE CHEST 1 VIEW COMPARISON:  Prior study 12/29/2018. FINDINGS: Endotracheal tube noted with its tip 2 cm above the carina in good anatomic position on this exam. NG tube tip below left hemidiaphragm. Heart size stable. Left lower lobe atelectatic changes. Small left pleural effusion cannot be excluded. Hemidiaphragm IMPRESSION: 1. Endotracheal tube has been repositioned, its tip is 2 cm above the carina. NG tube tip below left hemidiaphragm. 2. Persistent left lower lobe atelectasis/infiltrate. Small left pleural effusion cannot be excluded Electronically Signed   By: [place]   On: 12/29/2018 13:17   Dg Chest Port 1 View  Result Date: 12/29/2018 CLINICAL DATA:  73 year old female COVID-19. Intubated. EXAM: PORTABLE CHEST 1 VIEW COMPARISON:  12/16/2018 portable chest and earlier. FINDINGS: Portable AP semi upright view at 0000 hours. Endotracheal tube tip projects just above the carina, directed toward the right mainstem bronchus. Enteric tube courses to the abdomen, tip not included. The patient is now rotated to the right. Patchy and confluent left lower lung opacity persists. Worsening left lung base ventilation, now obscuring some of the left hemidiaphragm. No pneumothorax. The right lung appears clear. IMPRESSION: 1. Endotracheal tube tip just above the carina directed toward the right mainstem. Retract 1-2 cm for more optimal positioning. 2. Enteric tube courses to the abdomen, tip not included. 3. Worsening left lung ventilation since  yesterday, might in part reflect atelectasis due to #1. Electronically Signed   By: Genevie Ann M.D.   On: 12/29/2018 00:19     Medications:   . sodium chloride    . sodium chloride    . cefTRIAXone (ROCEPHIN)  IV 2 g (12/29/18 2344)  . dexmedetomidine (PRECEDEX) IV infusion 0.3 mcg/kg/hr (12/30/18 0030)  . heparin 950 Units/hr (12/30/18 0600)  . norepinephrine (LEVOPHED) Adult infusion     . amiodarone  200 mg Oral BID  . B-complex with vitamin C  1 tablet Oral Daily  . chlorhexidine gluconate (MEDLINE KIT)  15 mL Mouth Rinse BID  . Chlorhexidine Gluconate Cloth  6 each Topical Q0600  . cinacalcet  30 mg Oral Q T,Th,Sa-HD  . [START ON 01/27/19] doxercalciferol  3 mcg Intravenous Q T,Th,Sa-HD  . doxycycline  100 mg Oral Q12H  . feeding supplement (PRO-STAT SUGAR FREE 64)  30 mL Per Tube TID  . feeding supplement (VITAL HIGH PROTEIN)  1,000 mL Per Tube Q24H  . insulin aspart  0-9 Units Subcutaneous Q4H  . insulin detemir  12 Units Subcutaneous QHS  . ipratropium  0.5 mg Nebulization Q6H  . latanoprost  1 drop Both Eyes QHS  . levalbuterol  0.63 mg Nebulization Q6H  . loratadine  10 mg Oral Daily  . mouth rinse  15 mL Mouth Rinse 10 times per day  . Melatonin  4.5 mg Oral QHS  . metoprolol tartrate  25 mg Oral BID  . midodrine  10 mg Oral TID WC  . pantoprazole (PROTONIX) IV  40 mg Intravenous Daily  . pravastatin  20 mg Oral QHS  . predniSONE  40 mg Oral Q breakfast  . rOPINIRole  1 mg Oral QHS  . sodium chloride flush  3 mL Intravenous Q12H   sodium chloride, acetaminophen, bisacodyl, calcium acetate, docusate, fentaNYL (SUBLIMAZE) injection, fentaNYL (SUBLIMAZE) injection, hydrALAZINE, levalbuterol, polyvinyl alcohol  Assessment/ Plan:   End-stage renal disease Tuesday Thursday Saturday dialysis.  Had abbreviated treatments of dialysis on 12/29/2018 secondary to nonsustained V. tach.  The does not appear to be a  been much fluid removal.  I think we can safely do dialysis  01/30/19.  Acute respiratory failure intubated on the ventilator history of Covid 19 status post thrombectomy for critical limb ischemia.  01/07/2019  Atrial fibrillation rapid ventricular rate please to be in sinus rhythm at this time diltiazem drip has been discontinued appreciate assistance from cardiology  Left ischemic leg status post left femoral iliac thrombectomy appreciate assistance from vascular surgery  Anemia noted drop in hemoglobin consider transfusion.  Patient was not receiving darbepoetin will proceed with darbepoetin intravenously at next dialysis treatment 01-30-19  Nutrition we will follow n.p.o. at present  Diabetes as per primary team  Congestive heart failure with chronic systolic diastolic dysfunction.  History of coronary artery disease stable followed by cardiology  Hyperlipidemia treated with statins  COVID-19 infections steroids appears that she was on a steroid taper.  This needs to be evaluated by primary service.  And doxycycline  Chronic hypotension midodrine 10 mg 3 times daily  Bone mineral cinacalcet 30 mg with dialysis, Hectorol 3 mcg with dialysis.  Continue to follow calcium phosphorus.    LOS: [place] '@TODAY' '@8' :22 AM

## 2018-12-30 NOTE — Progress Notes (Signed)
Maintaining sinus rhythm at rate of 50-60s.

## 2018-12-30 NOTE — Progress Notes (Signed)
[place] for Heparin Indication: atrial fibrillation  No Known Allergies  Patient Measurements: Height: 5\' 2"  (157.5 cm) Weight: 174 lb 6.1 oz (79.1 kg) IBW/kg (Calculated) : 50.1 Heparin Dosing Weight: 70 kg  Vital Signs: Temp: 99.3 F (37.4 C) (10/16 0800) Temp Source: Oral (10/16 0800) BP: 108/47 (10/16 0745) Pulse Rate: 61 (10/16 0800)  Labs: Recent Labs    12/21/2018 0234 12/27/2018 0502  01/02/2019 1700 01/07/2019 2259  12/29/18 0258 12/29/18 0715 12/29/18 2046 12/30/18 0439 12/30/18 0731  HGB 10.2*  --   --   --   --    < > 9.6* 10.1*  --  7.1* 7.3*  HCT 31.5*  --   --   --   --    < > 28.8* 29.2*  --  22.0* 22.7*  PLT 162  --   --   --   --   --  162  --   --  143* 147*  HEPARINUNFRC  --   --    < >  --   --   --  1.08*  --  0.39 0.50  --   CREATININE 7.44*  --   --   --   --   --  8.64*  --   --  7.42*  --   TROPONINIHS 755* 764*  --  834* 682*  --   --   --   --   --   --    < > = values in this interval not displayed.    Estimated Creatinine Clearance: 6.6 mL/min (A) (by C-G formula based on SCr of 7.42 mg/dL (H)).  Assessment: 73 y.o. female started on IV heparin for new afib. Noted COVID+ from 10/6. D-dimer elevated at 3.5. Heparin was stopped overnight 10/15 with oozing from surgical site but has resolved and heparin was resumed.  Heparin level remains therapeutic. Hg down to 7.3, plt down to 147. No active bleed issues documented.  Goal of Therapy:  Heparin level 0.3-0.7 units/ml Monitor platelets by anticoagulation protocol: Yes   Plan:  Continue heparin IV at 950 units/hr  Monitor daily heparin level and CBC, s/sx bleeding  Elicia Lamp, PharmD, BCPS Please check AMION for all [place] contact numbers Clinical Pharmacist 12/30/2018 8:02 AM

## 2018-12-30 NOTE — Progress Notes (Signed)
Received call from lab re:  CBC.  Hgb 7.1 which was 3 gram drop.  Discussed with Dr. Justin Mend and order placed to recollect.  Will continue to monitor.

## 2018-12-30 NOTE — Progress Notes (Signed)
eLink Physician-Brief Progress Note Patient Name: Stefanie [name] DOB: [birth year] MRN: [id number]   Date of Service  12/30/2018  HPI/Events of Note  Hyperglycemia - Blood glucose = 424 on Q 4 hour Novolog SSI.  eICU Interventions  Will order: 1. D/C Q 4 hour Novolog SSI. 2. Will start Phase 2 glycemic control with IV insulin IV infusion.      Intervention Category Major Interventions: Hyperglycemia - active titration of insulin therapy  Lysle Dingwall 12/30/2018, 9:44 PM

## 2018-12-30 NOTE — Progress Notes (Signed)
Inpatient Diabetes Program Recommendations  AACE/ADA: New Consensus Statement on Inpatient Glycemic Control (2015)  Target Ranges:  Prepandial:   less than 140 mg/dL      Peak postprandial:   less than 180 mg/dL (1-2 hours)      Critically ill patients:  140 - 180 mg/dL   Lab Results  Component Value Date   GLUCAP 200 (H) 12/30/2018   HGBA1C 8.7 (H) 12/30/2018    Review of Glycemic Control Results for [place], [name] (MRN [id number]) as of 12/30/2018 12:04  Ref. Range 12/30/2018 00:37 12/30/2018 04:17 12/30/2018 07:49 12/30/2018 11:22  Glucose-Capillary Latest Ref Range: 70 - 99 mg/dL 270 (H) 266 (H) 217 (H) 200 (H)   Diabetes history: Type 2 DM Outpatient Diabetes medications: Levemir 12 units QHS, Glipizide 10 mg BID Current orders for Inpatient glycemic control: Levemir 12 units QHS, Novolog 0-9 units Q4H Prednisone 40 mg QAM Vital 45 ml/hr  Inpatient Diabetes Program Recommendations:    Consider starting Novolog 4 units Q4H (assuming tube feeds are not stopped or held for any reason).  Thanks,  Bronson Curb, MSN, RNC-OB Diabetes Coordinator [phone] (8a-5p)

## 2018-12-30 NOTE — Progress Notes (Signed)
NAME:  Stefanie [name], MRN:  [id number], DOB:  1946-02-25, LOS: 2 ADMISSION DATE:  01/03/2019, CONSULTATION DATE:  01/12/2019 REFERRING MD:  TRH, CHIEF COMPLAINT:  resp insufficiency   Brief History   42 yoF SNF resident with recent [place] diagnosis presenting with reports hypoxia and found to be in Afib with RVR and CXR with multifocal inflitrates.  Has not had any O2 requirement or respiratory distress.  Admitted by [facility] and started on heparin and cardizem.  On 10/14 found to have acute left ischemic leg taken to OR for emergent thrombectomy and returns to ICU postoperatively on mechanical ventilation.  PCCM consulted for further management.   Past Medical History  ESRD on TTS HD, CVA with residual left sided weakness, DMT2, chronic systolic/ diastolic HF with EF 99991111, CAD SNF resident.  COVID dx 12/20/2018  Significant Hospital Events   10/13 Admit  Consults:  Cards Nephrology Vascular surgery   PCCM  Procedures:  10/14 OR- left femoral/ left iliac thrombectomy  10/14 ETT >  Significant Diagnostic Tests:  CXR reviewed by me 10/16: looks like a RLL infiltrate is starting to develop radiographically.    Micro Data:  10/6 SARS CoV 2>> positive  10/13 BCx 2 >>> staph 1/4 bottles. Likely contaminant >>> 10/14 MRSA PCR >>  Antimicrobials:  10/13 azithromycin 10/14 cefazolin 10/13 CTX >> 10/14 doxycycline >>  Interim history/subjective:  Unable to tolerate HD yesterday due to a run on VT in the setting of severe hypomagnesemia. Comfortable on vent with minimal sedation. Pressors started overnight on NE 75mcg.  Objective   Blood pressure (!) 120/48, pulse 60, temperature 99.9 F (37.7 C), temperature source Axillary, resp. rate 16, height 5\' 2"  (1.575 m), weight 79.1 kg, SpO2 100 %.    Vent Mode: PRVC FiO2 (%):  [40 %] 40 % Set Rate:  [12 bmp] 12 bmp Vt Set:  [400 mL] 400 mL PEEP:  [5 cmH20] 5 cmH20 Plateau Pressure:  [17 cmH20-21 cmH20] 18 cmH20   Intake/Output  Summary (Last 24 hours) at 12/30/2018 0746 Last data filed at 12/30/2018 0600 Gross per 24 hour  Intake 1069.18 ml  Output 15 ml  Net 1054.18 ml   Filed Weights   12/29/18 1400 12/29/18 1553 12/30/18 0217  Weight: 77.8 kg 77.8 kg 79.1 kg   Examination:  General:  Frail elderly female in NAD on vent HEENT: [place]/AT, no appreciable, JVD.  Neuro: Arouses to voice. Follows commands.  CV: RRR, no MRG PULM:  Clear GI: Soft, non-tender, non-distended Extremities: No acute deformity or ROM limitation. L groin dressing clean and dry.  Skin: no rashes  Resolved Hospital Problem list    Assessment & Plan:   Acute respiratory insufficency related to surgery 10/14. Now complicated by PNA vs pulmonary edema.  ? Acute exacerbation of COPD. COVID infection - prior to OR was doing well on room air unclear if she has a true COVID pneumonia. This is less favored.  - Full vent support - Less hopeful she will extubate today due to low tidal volumes on 12/5 PSV - Empiric CAP coverage, prednisone. - iHD for volume removal - VAP bundle - Xopenex/atrovent scheduled.  - Continue precedex, PRN fentanyl for RASS goal 0 to -1.   Left ischemic leg s/p thrombectomy  - Per Vascular - Frequent neurovascular / groin checks  - Heparin continues per pharmacy  Hypotension: seems to be sedation related - midodrine started this admission to facilitate HD - Transiently requiring low dose NE. Suspect sedation related.  Afib with RVR - currently SR - Cardiology following - heparin - Tele monitoring - hold lopressor, hypotensive. Continue PRN. Restart scheduled when able.    ESRD on HD TTS - Nephrology following, hopeful she will tolerate IHD, otherwise may need CRRT. - Planning for iHD tomorrow - Follow BMP  Chronic Systolic/ diastolic HF HTN - HD as above for volume removal.  - PRN apresoline for SBP   DM - CBG q 4 with SSI - Levemir  Best practice:  Diet: NPO Pain/Anxiety/Delirium protocol  (if indicated): precedex/ prn fentanyl  VAP protocol (if indicated): yes DVT prophylaxis: heparin IV GI prophylaxis: PPI Glucose control: SSI q 4 Mobility: BR Code Status: DNR Family Communication: Daughter Falecia updated.  Disposition: ICU   Labs   CBC: Recent Labs  Lab 01/14/2019 2045 01/05/2019 0234 12/29/18 0014 12/29/18 0258 12/29/18 0715 12/30/18 0439  WBC 13.4* 12.7*  --  13.8*  --  11.5*  NEUTROABS 12.2* 11.4*  --  12.7*  --  9.9*  HGB 11.7* 10.2* 9.9* 9.6* 10.1* 7.1*  HCT 37.9 31.5* 29.0* 28.8* 29.2* 22.0*  MCV 102.4* 97.5  --  94.7  --  97.8  PLT 156 162  --  162  --  143*    Basic Metabolic Panel: Recent Labs  Lab 12/23/2018 2314 01/01/2019 0234 12/29/18 0014 12/29/18 0258 12/29/18 0715 12/29/18 2046 12/30/18 0439  NA 130* 136 133* 136  --   --  135  K 4.3 4.5 4.5 4.4  --   --  3.8  CL 86* 89*  --  90*  --   --  93*  CO2 20* 22  --  25  --   --  25  GLUCOSE 456* 302*  --  347*  --   --  271*  BUN 39* 45*  --  61*  --   --  54*  CREATININE 6.84* 7.44*  --  8.64*  --   --  7.42*  CALCIUM 7.4* 7.8*  --  7.5*  --   --  7.0*  MG  --   --   --   --  0.6* 2.6* 2.6*  PHOS  --   --   --   --  1.3* 3.4 4.0   GFR: Estimated Creatinine Clearance: 6.6 mL/min (A) (by C-G formula based on SCr of 7.42 mg/dL (H)). Recent Labs  Lab 01/05/2019 2030 12/16/2018 2045 01/08/2019 2314 01/09/2019 0234 12/29/18 0258 12/30/18 0439  WBC  --  13.4*  --  12.7* 13.8* 11.5*  LATICACIDVEN 2.8*  --  2.3*  --   --   --     Liver Function Tests: Recent Labs  Lab 01/14/2019 2314 12/28/18 0234 12/29/18 0258 12/30/18 0439  AST 25 23 35 37  ALT 18 15 18 8   ALKPHOS 47 47 48 47  BILITOT 1.7* 1.3* 0.7 0.5  PROT 6.0* 6.0* 5.9* 5.0*  ALBUMIN 2.6* 2.7* 2.5* 2.3*   No results for input(s): LIPASE, AMYLASE in the last 168 hours. No results for input(s): AMMONIA in the last 168 hours.  ABG    Component Value Date/Time   PHART 7.541 (H) 12/29/2018 0014   PCO2ART 32.2 12/29/2018 0014    PO2ART 83.0 12/29/2018 0014   HCO3 27.8 12/29/2018 0014   TCO2 29 12/29/2018 0014   O2SAT 98.0 12/29/2018 0014     Coagulation Profile: No results for input(s): INR, PROTIME in the last 168 hours.  Cardiac Enzymes: No results for input(s): CKTOTAL, CKMB, CKMBINDEX, TROPONINI  in the last 168 hours.  HbA1C: Hgb A1c MFr Bld  Date/Time Value Ref Range Status  01/13/2019 02:34 AM 8.7 (H) 4.8 - 5.6 % Final    Comment:    (NOTE) Pre diabetes:          5.7%-6.4% Diabetes:              >6.4% Glycemic control for   <7.0% adults with diabetes   08/01/2017 05:57 AM 7.7 (H) 4.8 - 5.6 % Final    Comment:    (NOTE) Pre diabetes:          5.7%-6.4% Diabetes:              >6.4% Glycemic control for   <7.0% adults with diabetes     CBG: Recent Labs  Lab 12/29/18 1313 12/29/18 1638 12/29/18 1937 12/30/18 0037 12/30/18 0417  GLUCAP 197* 160* 191* 270* 266*    CRITICAL CARE Performed by: Corey Harold   Total critical care time: 60 minutes  Critical care time was exclusive of separately billable procedures and treating other patients.  Critical care was necessary to treat or prevent imminent or life-threatening deterioration.  Critical care was time spent personally by me on the following activities: development of treatment plan with patient and/or surrogate as well as nursing, discussions with consultants, evaluation of patient's response to treatment, examination of patient, obtaining history from patient or surrogate, ordering and performing treatments and interventions, ordering and review of laboratory studies, ordering and review of radiographic studies, pulse oximetry and re-evaluation of patient's condition.    Georgann Housekeeper, AGACNP-BC [place] Pager [phone] or [phone]  12/30/2018 8:03 AM

## 2018-12-30 NOTE — Progress Notes (Signed)
    Subjective  - POD #2  Remains intubated   Physical Exam:  Wound with minimal bleeding. Calf is soft Pedal Doppler signals   Assessment/Plan:  POD #2  Stable from vascular perspective.  Now back on IV heparin  [place] Stefanie [name] 12/30/2018 6:42 PM --  Vitals:   12/30/18 1700 12/30/18 1800  BP: (!) 125/50 (!) 134/51  Pulse: 64 65  Resp: (!) 31 (!) 26  Temp:    SpO2: 98% 99%    Intake/Output Summary (Last 24 hours) at 12/30/2018 1842 Last data filed at 12/30/2018 1800 Gross per 24 hour  Intake 1267.06 ml  Output -  Net 1267.06 ml     Laboratory CBC    Component Value Date/Time   WBC 11.7 (H) 12/30/2018 0731   HGB 7.3 (L) 12/30/2018 0731   HCT 22.7 (L) 12/30/2018 0731   PLT 147 (L) 12/30/2018 0731    BMET    Component Value Date/Time   NA 135 12/30/2018 0439   K 3.8 12/30/2018 0439   CL 93 (L) 12/30/2018 0439   CO2 25 12/30/2018 0439   GLUCOSE 271 (H) 12/30/2018 0439   BUN 54 (H) 12/30/2018 0439   CREATININE 7.42 (H) 12/30/2018 0439   CALCIUM 7.0 (L) 12/30/2018 0439   GFRNONAA 5 (L) 12/30/2018 0439   GFRAA 6 (L) 12/30/2018 0439    COAG Lab Results  Component Value Date   INR 1.06 06/18/2016   INR 5.81 (HH) 04/28/2016   INR 1.16 02/26/2016   No results found for: PTT  Antibiotics Anti-infectives (From admission, onward)   Start     Dose/Rate Route Frequency Ordered Stop   12/29/18 2100  cefTRIAXone (ROCEPHIN) 1 g in sodium chloride 0.9 % 100 mL IVPB  Status:  Discontinued     1 g 200 mL/hr over 30 Minutes Intravenous Every 24 hours 01/01/2019 0002 12/30/2018 0003   12/29/18 2100  cefTRIAXone (ROCEPHIN) 2 g in sodium chloride 0.9 % 100 mL IVPB     2 g 200 mL/hr over 30 Minutes Intravenous Every 24 hours 12/29/18 1041     01/10/2019 2200  doxycycline (VIBRA-TABS) tablet 100 mg     100 mg Oral Every 12 hours 01/08/2019 1750 01/02/19 2159   12/20/2018 2100  cefTRIAXone (ROCEPHIN) 1 g in sodium chloride 0.9 % 100 mL IVPB  Status:  Discontinued     1 g  200 mL/hr over 30 Minutes Intravenous Every 24 hours 12/18/2018 0003 12/29/18 1041   12/18/2018 1800  azithromycin (ZITHROMAX) tablet 500 mg  Status:  Discontinued     500 mg Oral Daily after supper 01/10/2019 0003 01/08/2019 1750   01/11/2019 2145  cefTRIAXone (ROCEPHIN) 1 g in sodium chloride 0.9 % 100 mL IVPB     1 g 200 mL/hr over 30 Minutes Intravenous  Once 12/19/2018 2139 12/27/2018 0105   01/12/2019 2145  azithromycin (ZITHROMAX) 500 mg in sodium chloride 0.9 % 250 mL IVPB     500 mg 250 mL/hr over 60 Minutes Intravenous  Once 12/26/2018 2139 12/21/2018 0105       V. Leia Alf, M.D., [facility] [place] Office: [phone] Pager:  [phone]

## 2018-12-30 NOTE — Progress Notes (Signed)
Pharmacist Heart Failure Core Measure Documentation  Assessment: Stefanie [name] has an EF documented as 30-35% on 09/08/2017 by Stefanie [name], [employer].  Rationale: Heart failure patients with left ventricular systolic dysfunction (LVSD) and an EF < 40% should be prescribed an angiotensin converting enzyme inhibitor (ACEI) or angiotensin receptor blocker (ARB) at discharge unless a contraindication is documented in the medical record.  This patient is not currently on an ACEI or ARB for HF.  This note is being placed in the record in order to provide documentation that a contraindication to the use of these agents is present for this encounter.  ACE Inhibitor or Angiotensin Receptor Blocker is contraindicated (specify all that apply)  []   ACEI allergy AND ARB allergy []   Angioedema []   Moderate or severe aortic stenosis []   Hyperkalemia [x]   Hypotension []   Renal artery stenosis [x]   Worsening renal function, preexisting renal disease or dysfunction   Stefanie [name] P 12/30/2018 1:57 PM

## 2018-12-31 DIAGNOSIS — I469 Cardiac arrest, cause unspecified: Secondary | ICD-10-CM

## 2018-12-31 DIAGNOSIS — J1289 Other viral pneumonia: Secondary | ICD-10-CM

## 2018-12-31 LAB — GLUCOSE, CAPILLARY
Glucose-Capillary: 229 mg/dL — ABNORMAL HIGH (ref 70–99)
Glucose-Capillary: 292 mg/dL — ABNORMAL HIGH (ref 70–99)
Glucose-Capillary: 325 mg/dL — ABNORMAL HIGH (ref 70–99)
Glucose-Capillary: 403 mg/dL — ABNORMAL HIGH (ref 70–99)
Glucose-Capillary: 424 mg/dL — ABNORMAL HIGH (ref 70–99)
Glucose-Capillary: 431 mg/dL — ABNORMAL HIGH (ref 70–99)
Glucose-Capillary: 433 mg/dL — ABNORMAL HIGH (ref 70–99)
Glucose-Capillary: 172 mg/dL — ABNORMAL HIGH (ref 70–99)
Glucose-Capillary: 142 mg/dL — ABNORMAL HIGH (ref 70–99)
Glucose-Capillary: 126 mg/dL — ABNORMAL HIGH (ref 70–99)
Glucose-Capillary: 135 mg/dL — ABNORMAL HIGH (ref 70–99)

## 2018-12-31 MED ORDER — MIDAZOLAM HCL 2 MG/2ML IJ SOLN
INTRAMUSCULAR | Status: AC
  Administered 2018-12-31: 12:00:00 1 mg
  Filled 2018-12-31: qty 2

## 2018-12-31 MED ORDER — LEVALBUTEROL HCL 0.63 MG/3ML IN NEBU: 1.2500 mg | INHALATION_SOLUTION | Freq: Four times a day (QID) | RESPIRATORY_TRACT | Status: DC

## 2018-12-31 MED ORDER — INSULIN ASPART 100 UNIT/ML ~~LOC~~ SOLN: 0.0000 [IU] | SUBCUTANEOUS | Status: DC

## 2018-12-31 MED ORDER — INSULIN GLARGINE 100 UNIT/ML ~~LOC~~ SOLN
15.0000 [IU] | Freq: Every day | SUBCUTANEOUS | Status: DC
  Administered 2018-12-31: 11:00:00 15 [IU] via SUBCUTANEOUS
  Filled 2018-12-31: qty 0.15

## 2018-12-31 MED ORDER — INSULIN ASPART 100 UNIT/ML ~~LOC~~ SOLN: 3.0000 [IU] | SUBCUTANEOUS | Status: DC

## 2019-01-01 LAB — CULTURE, BLOOD (ROUTINE X 2): Special Requests: ADEQUATE

## 2019-01-01 MED FILL — Medication: Qty: 1 | Status: AC

## 2019-01-09 ENCOUNTER — Telehealth: Payer: Self-pay

## 2019-01-09 NOTE — Telephone Encounter (Signed)
Received dc from [facility].  DC is for burial and a patient of Doctor Loanne Drilling.  DC will be taken to Pulmonary Unit for signature.   On 01/11/2019 Received dc back from Doctor Loanne Drilling. I called the funeral home and Nakia with [employer] is going to come by and pick it up.

## 2019-01-15 NOTE — Code Documentation (Cosign Needed Addendum)
Called emergently to bedside for respiratory distress on vent.  RN reports pt was increasingly tachypneic and changed earlier to full support from PSV.  Patient had weaned from 7am to 10am on PSV.  Ventilator mechanics reviewed - vent unable to deliver inspiratory volume, no return volumes. High peak airway pressures.  Patient removed from mechanical ventilation, saline lavage performed with BVM. Unable to bag patient, unable to pass suction catheter.  Bradycardic on monitor.  Tube exchanger to bedside, Glide-Scope. Tube exchanger passed through ETT, ETT removed.  New ETT loaded and passed without difficulty into airway, secured at 23 cm.  Directly visualized placement of ETT through vocal cords ([place] I view) with Glide-Scope.  Brief CPR during process as this was felt to be a reversible process.  Unable to bag patient with new ETT in place.  Patient progressed to idioventricular rhythm despite CPR, epi x1 and tube exchange. Given patients desire for no CPR / expressed wishes.  Efforts ceased.  1125 am TOD.   Daughter Harriett Rush) notified via phone.  Family requests [facility] in [place].       Noe Gens, NP-C [facility] Pgr: [phone] or if no answer [phone] 2019/01/26, 11:45 AM  N-95, goggles, gown and face shield worn during events.

## 2019-01-15 NOTE — Progress Notes (Addendum)
NAME:  Stefanie [name], MRN:  [id number], DOB:  27-May-1945, LOS: 3 ADMISSION DATE:  12/26/2018, CONSULTATION DATE:  01/11/2019 REFERRING MD:  TRH, CHIEF COMPLAINT:  resp insufficiency   Brief History   9 yoF SNF resident with recent [place] diagnosis presenting with reports hypoxia and found to be in Afib with RVR and CXR with multifocal inflitrates.  Has not had any O2 requirement or respiratory distress.  Admitted by [facility] and started on heparin and cardizem.  On 10/14 found to have acute left ischemic leg taken to OR for emergent thrombectomy and returns to ICU postoperatively on mechanical ventilation.  PCCM consulted for further management.   Past Medical History  ESRD on TTS HD, CVA with residual left sided weakness, DMT2, chronic systolic/ diastolic HF with EF 99991111, CAD SNF resident.  COVID dx 12/20/2018  Significant Hospital Events   10/13 Admit 10/15 Did not tolerate HD due to VT 2/2 hypomagnesemia  10/16 Levophed started   Consults:  Cards Nephrology Vascular surgery   PCCM  Procedures:  10/14 OR for left femoral/ left iliac thrombectomy  10/14 ETT >  Significant Diagnostic Tests:  CXR 10/16 >> RLL infiltrate is starting to develop radiographically.   Micro Data:  10/6 SARS CoV 2 >> positive  10/13 BCx 2 >>> staph 1/4 bottles. Likely contaminant >>  10/14 MRSA PCR >> negative  Antimicrobials:  Azithromycin 10/13 >> 10/14 Cefazolin 10/14x1  Ceftriaxone 10/13 >>  Doxycycline 10/14 >>  Interim history/subjective:  RN reports pt on PSV wean 12/5, pt alert / interactive.  Afebrile.   Improved glucose, insulin gtt ~ 2 units / hr  Unable to get labs this am.   Objective   Blood pressure (!) 99/44, pulse 75, temperature 98.3 F (36.8 C), temperature source Axillary, resp. rate 17, height 5\' 2"  (1.575 m), weight 81.6 kg, SpO2 100 %.    Vent Mode: CPAP;PSV FiO2 (%):  [40 %] 40 % Set Rate:  [12 bmp] 12 bmp Vt Set:  [400 mL] 400 mL PEEP:  [5 cmH20] 5 cmH20 Pressure  Support:  [12 cmH20] 12 cmH20 Plateau Pressure:  [22 cmH20-24 cmH20] 24 cmH20   Intake/Output Summary (Last 24 hours) at 2019-01-05 [id number] Last data filed at 01-05-19 0600 Gross per 24 hour  Intake 1391.68 ml  Output -  Net 1391.68 ml   Filed Weights   12/29/18 1553 12/30/18 0217 Jan 05, 2019 0430  Weight: 77.8 kg 79.1 kg 81.6 kg   Examination:  General: elderly female lying in bed in NAD on vent HEENT: MM pink/moist, ETT Neuro: Awakens to voice, nods / appropriate, follows commands, MAE CV: s1s2 rrr, no m/r/g PULM:  Non-labored, mild tachypnea on PSV, Vt 220-250 on 12/5, wheezing on left  GI: soft, bsx4 active  Extremities: warm/dry, LE changes consistent with PVD, no edema  Skin: no rashes or lesions.  Left groin surgical wounds dressed, c/d/i  Resolved Hospital Problem list     Assessment & Plan:   Acute respiratory insufficency related to surgery 10/14. Now complicated by PNA vs pulmonary edema.  ? Acute exacerbation of COPD COVID infection - prior to OR was doing well on room air unclear if she has a true COVID pneumonia. This is less favored.  -PRVC 8cc/kg as rest mode -Daily PSV as tolerated > not a candidate for extubation given low Vt -empiric PNA coverage -continue prednisone 40 mg QD  -iHD for volume removal  -Xopenex / atrovent  -PRN fentanyl for RASS Goal 0 to -1   Left  ischemic leg s/p thrombectomy  -Post operative care per VVS -frequent neurovascular checks  -continue heparin gtt per pharmacy   Hypotension: seems to be sedation related -continue midodrine -hold lopressor 10/17 -ICU monitoring   Afib with RVR - currently SR -appreciate Cardiology input  -continue heparin gtt -HOLD lopressor 10/17 given midodrine use  -continue amiodarone    ESRD on HD TTS -Appreciate Nephrology input  -trend BMP  -volume removal with iHD   Chronic Systolic/ diastolic HF HTN -hold lopressor  -prn hydralazine for SBP > 170  DM -discontinue insulin gtt after  two more hours -add 3 units Q4 "meal coverage" with TF -lantus 15 units QD -resistant SSI   Best practice:  Diet: NPO Pain/Anxiety/Delirium protocol (if indicated): precedex/ prn fentanyl  VAP protocol (if indicated): yes DVT prophylaxis: heparin IV GI prophylaxis: PPI Glucose control: SSI q 4 Mobility: BR Code Status: DNR Family Communication: Daughter Harriett Rush updated via phone 10/17.  Reviewed the progress her mother is making on PSV but not quite ready for extubation. She indicates understanding. We discussed the possibility of extubation and the question of 'if she fails' would she want to be reintubated.  Harriett Rush is going to talk with her siblings and we will follow up.    Disposition: ICU   Labs   CBC: Recent Labs  Lab 12/25/2018 2045 01/06/2019 0234 12/29/18 0014 12/29/18 0258 12/29/18 0715 12/30/18 0439 12/30/18 0731  WBC 13.4* 12.7*  --  13.8*  --  11.5* 11.7*  NEUTROABS 12.2* 11.4*  --  12.7*  --  9.9*  --   HGB 11.7* 10.2* 9.9* 9.6* 10.1* 7.1* 7.3*  HCT 37.9 31.5* 29.0* 28.8* 29.2* 22.0* 22.7*  MCV 102.4* 97.5  --  94.7  --  97.8 99.6  PLT 156 162  --  162  --  143* 147*    Basic Metabolic Panel: Recent Labs  Lab 12/18/2018 2314 01/08/2019 0234 12/29/18 0014 12/29/18 0258 12/29/18 0715 12/29/18 2046 12/30/18 0439 12/30/18 1741  NA 130* 136 133* 136  --   --  135  --   K 4.3 4.5 4.5 4.4  --   --  3.8  --   CL 86* 89*  --  90*  --   --  93*  --   CO2 20* 22  --  25  --   --  25  --   GLUCOSE 456* 302*  --  347*  --   --  271*  --   BUN 39* 45*  --  61*  --   --  54*  --   CREATININE 6.84* 7.44*  --  8.64*  --   --  7.42*  --   CALCIUM 7.4* 7.8*  --  7.5*  --   --  7.0*  --   MG  --   --   --   --  0.6* 2.6* 2.6* 2.6*  PHOS  --   --   --   --  1.3* 3.4 4.0 4.8*   GFR: Estimated Creatinine Clearance: 6.7 mL/min (A) (by C-G formula based on SCr of 7.42 mg/dL (H)). Recent Labs  Lab 12/19/2018 2030  12/20/2018 2314 01/04/2019 0234 12/29/18 0258 12/30/18 0439  12/30/18 0731  WBC  --    < >  --  12.7* 13.8* 11.5* 11.7*  LATICACIDVEN 2.8*  --  2.3*  --   --   --   --    < > = values in this interval not displayed.  Liver Function Tests: Recent Labs  Lab 01/12/2019 2314 01/05/2019 0234 12/29/18 0258 12/30/18 0439  AST 25 23 35 37  ALT 18 15 18 8   ALKPHOS 47 47 48 47  BILITOT 1.7* 1.3* 0.7 0.5  PROT 6.0* 6.0* 5.9* 5.0*  ALBUMIN 2.6* 2.7* 2.5* 2.3*   No results for input(s): LIPASE, AMYLASE in the last 168 hours. No results for input(s): AMMONIA in the last 168 hours.  ABG    Component Value Date/Time   PHART 7.541 (H) 12/29/2018 0014   PCO2ART 32.2 12/29/2018 0014   PO2ART 83.0 12/29/2018 0014   HCO3 27.8 12/29/2018 0014   TCO2 29 12/29/2018 0014   O2SAT 98.0 12/29/2018 0014     Coagulation Profile: No results for input(s): INR, PROTIME in the last 168 hours.  Cardiac Enzymes: No results for input(s): CKTOTAL, CKMB, CKMBINDEX, TROPONINI in the last 168 hours.  HbA1C: Hgb A1c MFr Bld  Date/Time Value Ref Range Status  01/03/2019 02:34 AM 8.7 (H) 4.8 - 5.6 % Final    Comment:    (NOTE) Pre diabetes:          5.7%-6.4% Diabetes:              >6.4% Glycemic control for   <7.0% adults with diabetes   08/01/2017 05:57 AM 7.7 (H) 4.8 - 5.6 % Final    Comment:    (NOTE) Pre diabetes:          5.7%-6.4% Diabetes:              >6.4% Glycemic control for   <7.0% adults with diabetes     CBG: Recent Labs  Lab 01/09/2019 0416 01/09/19 0520 January 09, 2019 0639 01-09-19 0747 2019-01-09 0836  GLUCAP 292* 229* 172* 142* 126*    CRITICAL CARE Performed by: Noe Gens   Total critical care time: 35 minutes  Critical care time was exclusive of separately billable procedures and treating other patients.  Critical care was necessary to treat or prevent imminent or life-threatening deterioration.  Critical care was time spent personally by me on the following activities: development of treatment plan with patient and/or  surrogate as well as nursing, discussions with consultants, evaluation of patient's response to treatment, examination of patient, obtaining history from patient or surrogate, ordering and performing treatments and interventions, ordering and review of laboratory studies, ordering and review of radiographic studies, pulse oximetry and re-evaluation of patient's condition.   Noe Gens, NP-C [facility] Pgr: [phone] or if no answer [phone] 01-09-19, 9:03 AM

## 2019-01-15 NOTE — Death Summary Note (Signed)
DEATH SUMMARY   Patient Details  Name: Stefanie [name] MRN: [id number] DOB: 1945-08-30  Admission/Discharge Information   Admit Date:  12/29/2018  Date of Death: Date of Death: January 02, 2019  Time of Death: Time of Death: 55  Length of Stay: 3  Referring Physician: Bonnita Nasuti, MD   Reason(s) for Hospitalization  Atrial fibrillation with rapid ventricular rate  Diagnoses  Preliminary cause of death: Cardiac arrest secondary acute hypoxemic respiratory failure related to mucous plugging Secondary Diagnoses (including complications and co-morbidities):  Principal Problem:   Atrial fibrillation with RVR ([place]) Active Problems:   ESRD on dialysis ([place])   Chronic combined systolic and diastolic CHF (congestive heart failure) ([place])   COVID-19 virus infection   COVID-19   Hypotension   Acute hypoxemic respiratory failure [facility])   [place] Hospital Course (including significant findings, care, treatment, and services provided and events leading to death)  Stefanie S Delauder is a 73 y.o. year old female with atrial fibrillation, ESRD on HD, COVID positive status (on room air) admitted for [facility] with hospital course complicated by acute ischemic leg. Patient was transferred post-op after emergent thrombectomy to ICU. Yesterday and today she tolerated vent weaning protocol. Today, she tolerated 3 hours before returning to full vent settings due to tachypnea. At 11 am, she went into respiratory distress with evidence of high peak pressures. Tidal volumes in and out were significantly reduced compared to earlier this morning (mid 200s). Team made attempt to clear tube with suction and saline. Patient became bradycardic and lost pulses. CPR was briefly started while ETT was exchanged for possible reversible condition. No apparent occlusion was found however resistance to BVM was noted by team. Despite ETT exchange, patient remained hemodynamically unstable. CPR was ceased as patient is DNR. Patient  expired at 11:25 AM.   Pertinent Labs and Studies  Significant Diagnostic Studies Dg Chest Port 1 View  Result Date: 12/30/2018 CLINICAL DATA:  Hypoxia EXAM: PORTABLE CHEST 1 VIEW COMPARISON:  Yesterday FINDINGS: Endotracheal tube tip is just below the clavicular heads. The orogastric tube reaches the stomach. Cardiomegaly. Indistinct density at the lung bases is new or progressed from prior. No effusion or pneumothorax. Stable linear metallic density over the right thoracic inlet. IMPRESSION: 1. Stable hardware positioning. 2. Patchy bilateral pulmonary opacity which could reflect pneumonia or atelectasis, progressed. 3. Cardiomegaly without edema. Electronically Signed   By: Monte Fantasia M.D.   On: 12/30/2018 06:59   Dg Chest Port 1 View  Result Date: 12/29/2018 CLINICAL DATA:  Hypoxia. EXAM: PORTABLE CHEST 1 VIEW COMPARISON:  Prior study 12/29/2018. FINDINGS: Endotracheal tube noted with its tip 2 cm above the carina in good anatomic position on this exam. NG tube tip below left hemidiaphragm. Heart size stable. Left lower lobe atelectatic changes. Small left pleural effusion cannot be excluded. Hemidiaphragm IMPRESSION: 1. Endotracheal tube has been repositioned, its tip is 2 cm above the carina. NG tube tip below left hemidiaphragm. 2. Persistent left lower lobe atelectasis/infiltrate. Small left pleural effusion cannot be excluded Electronically Signed   By: [place]   On: 12/29/2018 13:17   Dg Chest Port 1 View  Result Date: 12/29/2018 CLINICAL DATA:  73 year old female COVID-19. Intubated. EXAM: PORTABLE CHEST 1 VIEW COMPARISON:  12/29/2018 portable chest and earlier. FINDINGS: Portable AP semi upright view at 0000 hours. Endotracheal tube tip projects just above the carina, directed toward the right mainstem bronchus. Enteric tube courses to the abdomen, tip not included. The patient is now rotated to the right.  Patchy and confluent left lower lung opacity persists. Worsening  left lung base ventilation, now obscuring some of the left hemidiaphragm. No pneumothorax. The right lung appears clear. IMPRESSION: 1. Endotracheal tube tip just above the carina directed toward the right mainstem. Retract 1-2 cm for more optimal positioning. 2. Enteric tube courses to the abdomen, tip not included. 3. Worsening left lung ventilation since yesterday, might in part reflect atelectasis due to #1. Electronically Signed   By: Genevie Ann M.D.   On: 12/29/2018 00:19   Dg Chest Port 1 View  Result Date: 12/16/2018 CLINICAL DATA:  73 year old female COVID-19. Atrial fibrillation with RVR. EXAM: PORTABLE CHEST 1 VIEW COMPARISON:  12/22/2018 portable chest and earlier. FINDINGS: Portable AP semi upright view at 1932 hours. Pacer pads project over the left chest but there appears to be increasing streaky and confluent left perihilar and basilar opacity. Stable lung volumes. Stable cardiac size and mediastinal contours. Visualized tracheal air column is within normal limits. There is a chronic linear metal retained foreign body which is posterior to the medial right clavicle and stable from a CT in 2017. Allowing for portable technique the right lung remains clear. No pleural effusion Negative visible bowel gas pattern. Left axillary vascular stent. IMPRESSION: 1. Increasing left lung opacity suspicious for COVID-19 pneumonia. 2. No pleural effusion.  Right lung remains clear. Electronically Signed   By: Genevie Ann M.D.   On: 12/24/2018 20:08   Dg Chest Port 1 View  Result Date: 12/22/2018 CLINICAL DATA:  COVID-19 positive.  Shortness of breath. EXAM: PORTABLE CHEST 1 VIEW COMPARISON:  12/20/2018 and prior radiographs FINDINGS: Cardiomegaly again noted. There is no evidence of focal airspace disease, pulmonary edema, suspicious pulmonary nodule/mass, pleural effusion, or pneumothorax. Linear metallic structure overlying the UPPER RIGHT chest again noted. LEFT axillary stent identified. No acute bony  abnormalities are identified. IMPRESSION: Cardiomegaly without evidence of acute cardiopulmonary disease. Electronically Signed   By: Margarette Canada M.D.   On: 12/22/2018 12:23   Dg Chest Port 1 View  Result Date: 12/20/2018 CLINICAL DATA:  Cough. EXAM: PORTABLE CHEST 1 VIEW COMPARISON:  Radiographs of August 24, 2017. FINDINGS: Stable cardiomegaly. No pneumothorax or pleural effusion is noted. No acute pulmonary disease is noted. Bony thorax is unremarkable. Stable linear metallic density is seen over the right lung apex consistent with foreign body. IMPRESSION: No acute cardiopulmonary abnormality seen. Stable linear metallic density is seen over right lung apex consistent with foreign body. Electronically Signed   By: Marijo Conception M.D.   On: 12/20/2018 10:02    Microbiology Recent Results (from the past 240 hour(s))  Blood Culture (routine x 2)     Status: None   Collection Time: 12/22/18 12:00 PM   Specimen: BLOOD LEFT ARM  Result Value Ref Range Status   Specimen Description BLOOD LEFT ARM  Final   Special Requests   Final    BOTTLES DRAWN AEROBIC AND ANAEROBIC Blood Culture adequate volume   Culture   Final    NO GROWTH 5 DAYS Performed at [facility], [address] [place], [place] [address]    Report Status 12/23/2018 FINAL  Final  Blood Culture (routine x 2)     Status: None   Collection Time: 12/22/18 12:10 PM   Specimen: BLOOD RIGHT ARM  Result Value Ref Range Status   Specimen Description BLOOD RIGHT ARM  Final   Special Requests   Final    BOTTLES DRAWN AEROBIC AND ANAEROBIC Blood Culture  adequate volume   Culture   Final    NO GROWTH 5 DAYS Performed at [facility], [place] [address] [place], [place] [address]    Report Status 01/10/2019 FINAL  Final  Blood Culture (routine x 2)     Status: Abnormal   Collection Time: 12/20/2018  8:35 PM   Specimen: BLOOD LEFT HAND  Result Value Ref Range Status   Specimen Description BLOOD LEFT HAND  Final   Special  Requests   Final    BOTTLES DRAWN AEROBIC AND ANAEROBIC Blood Culture results may not be optimal due to an inadequate volume of blood received in culture bottles   Culture  Setup Time   Final    AEROBIC BOTTLE ONLY GRAM POSITIVE COCCI CRITICAL RESULT CALLED TO, READ BACK BY AND VERIFIED WITH: L SEAY PHARMD 12/29/18 0032 JDW    Culture (A)  Final    STAPHYLOCOCCUS SPECIES (COAGULASE NEGATIVE) THE SIGNIFICANCE OF ISOLATING THIS ORGANISM FROM A SINGLE SET OF BLOOD CULTURES WHEN MULTIPLE SETS ARE DRAWN IS UNCERTAIN. PLEASE NOTIFY THE MICROBIOLOGY DEPARTMENT WITHIN ONE WEEK IF SPECIATION AND SENSITIVITIES ARE REQUIRED. Performed at [facility], [place] [address] [place], [place] [address]    Report Status 12/29/2018 FINAL  Final  Blood Culture ID Panel (Reflexed)     Status: Abnormal   Collection Time: 12/24/2018  8:35 PM  Result Value Ref Range Status   Enterococcus species NOT DETECTED NOT DETECTED Final   Listeria monocytogenes NOT DETECTED NOT DETECTED Final   Staphylococcus species DETECTED (A) NOT DETECTED Final    Comment: Methicillin (oxacillin) susceptible coagulase negative staphylococcus. Possible blood culture contaminant (unless isolated from more than one blood culture draw or clinical case suggests pathogenicity). No antibiotic treatment is indicated for blood  culture contaminants. CRITICAL RESULT CALLED TO, READ BACK BY AND VERIFIED WITH: L SEAY PHARMD 12/29/18 0032 JDW    Staphylococcus aureus (BCID) NOT DETECTED NOT DETECTED Final   Methicillin resistance NOT DETECTED NOT DETECTED Final   Streptococcus species NOT DETECTED NOT DETECTED Final   Streptococcus agalactiae NOT DETECTED NOT DETECTED Final   Streptococcus pneumoniae NOT DETECTED NOT DETECTED Final   Streptococcus pyogenes NOT DETECTED NOT DETECTED Final   Acinetobacter baumannii NOT DETECTED NOT DETECTED Final   Enterobacteriaceae species NOT DETECTED NOT DETECTED Final   Enterobacter cloacae complex NOT  DETECTED NOT DETECTED Final   Escherichia coli NOT DETECTED NOT DETECTED Final   Klebsiella oxytoca NOT DETECTED NOT DETECTED Final   Klebsiella pneumoniae NOT DETECTED NOT DETECTED Final   Proteus species NOT DETECTED NOT DETECTED Final   Serratia marcescens NOT DETECTED NOT DETECTED Final   Haemophilus influenzae NOT DETECTED NOT DETECTED Final   Neisseria meningitidis NOT DETECTED NOT DETECTED Final   Pseudomonas aeruginosa NOT DETECTED NOT DETECTED Final   Candida albicans NOT DETECTED NOT DETECTED Final   Candida glabrata NOT DETECTED NOT DETECTED Final   Candida krusei NOT DETECTED NOT DETECTED Final   Candida parapsilosis NOT DETECTED NOT DETECTED Final   Candida tropicalis NOT DETECTED NOT DETECTED Final    Comment: Performed at [facility], [place], [place], [place] [address]  Blood Culture (routine x 2)     Status: None (Preliminary result)   Collection Time: 12/24/2018 11:12 PM   Specimen: BLOOD RIGHT FOREARM  Result Value Ref Range Status   Specimen Description BLOOD RIGHT FOREARM  Final   Special Requests   Final    BOTTLES DRAWN AEROBIC AND ANAEROBIC Blood  Culture adequate volume   Culture  Setup Time   Final    ANAEROBIC BOTTLE ONLY GRAM VARIABLE ROD CRITICAL RESULT CALLED TO, READ BACK BY AND VERIFIED WITH: L SEAY PHARMD 2019/01/21 0009 JDW    Culture   Final    CULTURE REINCUBATED FOR BETTER GROWTH Performed at [facility], [address], [place], [place] [address]    Report Status PENDING  Incomplete  MRSA PCR Screening     Status: None   Collection Time: 12/19/2018 10:29 PM   Specimen: Nasopharyngeal  Result Value Ref Range Status   MRSA by PCR NEGATIVE NEGATIVE Final    Comment:        The GeneXpert MRSA Assay (FDA approved for NASAL specimens only), is one component of a comprehensive MRSA colonization surveillance program. It is not intended to diagnose MRSA infection nor to guide or monitor treatment for MRSA infections. Performed  at [facility], [place] [address] [place], [place] [address]     Lab Basic Metabolic Panel: Recent Labs  Lab 01/04/2019 2314 01/13/2019 0234 12/29/18 0014 12/29/18 0258 12/29/18 0715 12/29/18 2046 12/30/18 0439 12/30/18 1741  NA 130* 136 133* 136  --   --  135  --   K 4.3 4.5 4.5 4.4  --   --  3.8  --   CL 86* 89*  --  90*  --   --  93*  --   CO2 20* 22  --  25  --   --  25  --   GLUCOSE 456* 302*  --  347*  --   --  271*  --   BUN 39* 45*  --  61*  --   --  54*  --   CREATININE 6.84* 7.44*  --  8.64*  --   --  7.42*  --   CALCIUM 7.4* 7.8*  --  7.5*  --   --  7.0*  --   MG  --   --   --   --  0.6* 2.6* 2.6* 2.6*  PHOS  --   --   --   --  1.3* 3.4 4.0 4.8*   Liver Function Tests: Recent Labs  Lab 01/02/2019 2314 12/30/2018 0234 12/29/18 0258 12/30/18 0439  AST 25 23 35 37  ALT 18 15 18 8   ALKPHOS 47 47 48 47  BILITOT 1.7* 1.3* 0.7 0.5  PROT 6.0* 6.0* 5.9* 5.0*  ALBUMIN 2.6* 2.7* 2.5* 2.3*   No results for input(s): LIPASE, AMYLASE in the last 168 hours. No results for input(s): AMMONIA in the last 168 hours. CBC: Recent Labs  Lab 01/05/2019 2045 12/16/2018 0234 12/29/18 0014 12/29/18 0258 12/29/18 0715 12/30/18 0439 12/30/18 0731  WBC 13.4* 12.7*  --  13.8*  --  11.5* 11.7*  NEUTROABS 12.2* 11.4*  --  12.7*  --  9.9*  --   HGB 11.7* 10.2* 9.9* 9.6* 10.1* 7.1* 7.3*  HCT 37.9 31.5* 29.0* 28.8* 29.2* 22.0* 22.7*  MCV 102.4* 97.5  --  94.7  --  97.8 99.6  PLT 156 162  --  162  --  143* 147*   Cardiac Enzymes: No results for input(s): CKTOTAL, CKMB, CKMBINDEX, TROPONINI in the last 168 hours. Sepsis Labs: Recent Labs  Lab 01/07/2019 2030  01/03/2019 2314 12/15/2018 0234 12/29/18 0258 12/30/18 0439 12/30/18 0731  WBC  --    < >  --  12.7* 13.8* 11.5* 11.7*  LATICACIDVEN 2.8*  --  2.3*  --   --   --   --    < > =  values in this interval not displayed.    Procedures/Operations  ETT 10/14 Left femoral and left iliac thrombectomy 10/14   [place] 2019-01-19, 1:46 PM

## 2019-01-15 NOTE — Progress Notes (Signed)
No recurrence of atrial fibrillation since her initial day of presentation.. Telemetry shows uninterrupted normal sinus rhythm. No new episodes of ventricular tachycardia. Electrolytes have been repleted. Remains intubated/mechanically ventilated and sedated.  On intravenous heparin.  Sanda Klein, MD, [facility] [phone] office [phone] pager

## 2019-01-15 DEATH — deceased
